# Patient Record
Sex: Female | Born: 1960
Health system: Southern US, Community
[De-identification: ages and names within clinical notes are randomized; demographics above are authoritative.]

## PROBLEM LIST (undated history)

## (undated) DIAGNOSIS — E039 Hypothyroidism, unspecified: Secondary | ICD-10-CM

## (undated) DIAGNOSIS — R109 Unspecified abdominal pain: Secondary | ICD-10-CM

## (undated) DIAGNOSIS — I10 Essential (primary) hypertension: Secondary | ICD-10-CM

## (undated) DIAGNOSIS — T50901A Poisoning by unspecified drugs, medicaments and biological substances, accidental (unintentional), initial encounter: Secondary | ICD-10-CM

## (undated) DIAGNOSIS — R251 Tremor, unspecified: Secondary | ICD-10-CM

## (undated) DIAGNOSIS — F32A Depression, unspecified: Secondary | ICD-10-CM

## (undated) DIAGNOSIS — E785 Hyperlipidemia, unspecified: Secondary | ICD-10-CM

## (undated) DIAGNOSIS — G43909 Migraine, unspecified, not intractable, without status migrainosus: Secondary | ICD-10-CM

## (undated) DIAGNOSIS — F419 Anxiety disorder, unspecified: Secondary | ICD-10-CM

## (undated) HISTORY — DX: Hyperlipidemia, unspecified: E78.5

## (undated) HISTORY — DX: Essential (primary) hypertension: I10

## (undated) HISTORY — PX: BILATERAL OOPHORECTOMY: SHX1221

## (undated) HISTORY — PX: VENTRAL HERNIA REPAIR: SHX424

## (undated) HISTORY — PX: HERNIA REPAIR: SHX51

## (undated) HISTORY — DX: Poisoning by unspecified drugs, medicaments and biological substances, accidental (unintentional), initial encounter: T50.901A

## (undated) HISTORY — DX: Anxiety disorder, unspecified: F41.9

## (undated) HISTORY — DX: Hypothyroidism, unspecified: E03.9

## (undated) HISTORY — PX: APPENDECTOMY: SHX54

## (undated) HISTORY — DX: Migraine, unspecified, not intractable, without status migrainosus: G43.909

## (undated) HISTORY — DX: Unspecified abdominal pain: R10.9

---

## 1998-08-10 ENCOUNTER — Emergency Department (HOSPITAL_COMMUNITY): Admission: EM | Admit: 1998-08-10 | Discharge: 1998-08-10 | Payer: Self-pay | Admitting: Emergency Medicine

## 1998-11-14 ENCOUNTER — Other Ambulatory Visit: Admission: RE | Admit: 1998-11-14 | Discharge: 1998-11-14 | Payer: Self-pay | Admitting: Obstetrics and Gynecology

## 1998-12-20 ENCOUNTER — Ambulatory Visit (HOSPITAL_BASED_OUTPATIENT_CLINIC_OR_DEPARTMENT_OTHER): Admission: RE | Admit: 1998-12-20 | Discharge: 1998-12-20 | Payer: Self-pay | Admitting: Orthopedic Surgery

## 2000-02-23 ENCOUNTER — Encounter: Admission: RE | Admit: 2000-02-23 | Discharge: 2000-02-23 | Payer: Self-pay | Admitting: Family Medicine

## 2000-02-23 ENCOUNTER — Encounter: Payer: Self-pay | Admitting: Family Medicine

## 2000-06-12 ENCOUNTER — Other Ambulatory Visit: Admission: RE | Admit: 2000-06-12 | Discharge: 2000-06-12 | Payer: Self-pay | Admitting: *Deleted

## 2000-11-27 ENCOUNTER — Encounter: Payer: Self-pay | Admitting: Surgery

## 2000-12-04 ENCOUNTER — Inpatient Hospital Stay (HOSPITAL_COMMUNITY): Admission: RE | Admit: 2000-12-04 | Discharge: 2000-12-05 | Payer: Self-pay | Admitting: Surgery

## 2001-08-06 ENCOUNTER — Other Ambulatory Visit: Admission: RE | Admit: 2001-08-06 | Discharge: 2001-08-06 | Payer: Self-pay | Admitting: *Deleted

## 2002-10-13 ENCOUNTER — Encounter: Payer: Self-pay | Admitting: General Surgery

## 2002-10-13 ENCOUNTER — Ambulatory Visit (HOSPITAL_COMMUNITY): Admission: RE | Admit: 2002-10-13 | Discharge: 2002-10-13 | Payer: Self-pay | Admitting: General Surgery

## 2002-10-19 ENCOUNTER — Emergency Department (HOSPITAL_COMMUNITY): Admission: EM | Admit: 2002-10-19 | Discharge: 2002-10-19 | Payer: Self-pay | Admitting: Emergency Medicine

## 2002-10-19 ENCOUNTER — Encounter: Payer: Self-pay | Admitting: Emergency Medicine

## 2002-10-26 ENCOUNTER — Other Ambulatory Visit: Admission: RE | Admit: 2002-10-26 | Discharge: 2002-10-26 | Payer: Self-pay | Admitting: *Deleted

## 2003-12-03 ENCOUNTER — Ambulatory Visit (HOSPITAL_COMMUNITY): Admission: RE | Admit: 2003-12-03 | Discharge: 2003-12-03 | Payer: Self-pay | Admitting: *Deleted

## 2004-02-03 ENCOUNTER — Other Ambulatory Visit: Admission: RE | Admit: 2004-02-03 | Discharge: 2004-02-03 | Payer: Self-pay | Admitting: *Deleted

## 2004-02-18 ENCOUNTER — Emergency Department (HOSPITAL_COMMUNITY): Admission: EM | Admit: 2004-02-18 | Discharge: 2004-02-18 | Payer: Self-pay | Admitting: Emergency Medicine

## 2004-04-13 ENCOUNTER — Ambulatory Visit: Payer: Self-pay | Admitting: Internal Medicine

## 2004-05-15 ENCOUNTER — Encounter: Admission: RE | Admit: 2004-05-15 | Discharge: 2004-08-13 | Payer: Self-pay | Admitting: Internal Medicine

## 2005-03-14 ENCOUNTER — Ambulatory Visit (HOSPITAL_COMMUNITY): Admission: RE | Admit: 2005-03-14 | Discharge: 2005-03-14 | Payer: Self-pay | Admitting: *Deleted

## 2005-03-16 ENCOUNTER — Emergency Department (HOSPITAL_COMMUNITY): Admission: EM | Admit: 2005-03-16 | Discharge: 2005-03-17 | Payer: Self-pay | Admitting: Emergency Medicine

## 2005-12-19 ENCOUNTER — Inpatient Hospital Stay (HOSPITAL_COMMUNITY): Admission: AD | Admit: 2005-12-19 | Discharge: 2005-12-20 | Payer: Self-pay | Admitting: Obstetrics & Gynecology

## 2005-12-20 ENCOUNTER — Observation Stay (HOSPITAL_COMMUNITY): Admission: AD | Admit: 2005-12-20 | Discharge: 2005-12-21 | Payer: Self-pay | Admitting: Obstetrics & Gynecology

## 2006-01-03 ENCOUNTER — Encounter (INDEPENDENT_AMBULATORY_CARE_PROVIDER_SITE_OTHER): Payer: Self-pay | Admitting: Specialist

## 2006-01-03 ENCOUNTER — Inpatient Hospital Stay (HOSPITAL_COMMUNITY): Admission: RE | Admit: 2006-01-03 | Discharge: 2006-01-08 | Payer: Self-pay | Admitting: Obstetrics & Gynecology

## 2006-05-15 ENCOUNTER — Emergency Department (HOSPITAL_COMMUNITY): Admission: EM | Admit: 2006-05-15 | Discharge: 2006-05-15 | Payer: Self-pay | Admitting: Emergency Medicine

## 2006-08-26 ENCOUNTER — Emergency Department (HOSPITAL_COMMUNITY): Admission: EM | Admit: 2006-08-26 | Discharge: 2006-08-26 | Payer: Self-pay

## 2007-02-10 HISTORY — PX: ABDOMINAL HYSTERECTOMY: SHX81

## 2007-02-12 ENCOUNTER — Encounter (INDEPENDENT_AMBULATORY_CARE_PROVIDER_SITE_OTHER): Payer: Self-pay | Admitting: Obstetrics and Gynecology

## 2007-02-12 ENCOUNTER — Ambulatory Visit (HOSPITAL_COMMUNITY): Admission: RE | Admit: 2007-02-12 | Discharge: 2007-02-12 | Payer: Self-pay | Admitting: Obstetrics and Gynecology

## 2007-06-12 DIAGNOSIS — T50901A Poisoning by unspecified drugs, medicaments and biological substances, accidental (unintentional), initial encounter: Secondary | ICD-10-CM

## 2007-06-12 HISTORY — DX: Poisoning by unspecified drugs, medicaments and biological substances, accidental (unintentional), initial encounter: T50.901A

## 2007-10-16 ENCOUNTER — Emergency Department (HOSPITAL_COMMUNITY): Admission: EM | Admit: 2007-10-16 | Discharge: 2007-10-16 | Payer: Self-pay | Admitting: Emergency Medicine

## 2007-10-16 ENCOUNTER — Inpatient Hospital Stay (HOSPITAL_COMMUNITY): Admission: AD | Admit: 2007-10-16 | Discharge: 2007-10-20 | Payer: Self-pay | Admitting: *Deleted

## 2007-10-16 ENCOUNTER — Ambulatory Visit: Payer: Self-pay | Admitting: *Deleted

## 2007-10-21 ENCOUNTER — Other Ambulatory Visit (HOSPITAL_COMMUNITY): Admission: RE | Admit: 2007-10-21 | Discharge: 2007-11-11 | Payer: Self-pay | Admitting: Psychiatry

## 2007-10-22 ENCOUNTER — Ambulatory Visit: Payer: Self-pay | Admitting: Psychiatry

## 2007-11-12 ENCOUNTER — Ambulatory Visit (HOSPITAL_COMMUNITY): Payer: Self-pay | Admitting: Psychiatry

## 2007-12-01 ENCOUNTER — Emergency Department (HOSPITAL_COMMUNITY): Admission: EM | Admit: 2007-12-01 | Discharge: 2007-12-01 | Payer: Self-pay | Admitting: Emergency Medicine

## 2007-12-16 ENCOUNTER — Ambulatory Visit (HOSPITAL_COMMUNITY): Payer: Self-pay | Admitting: Psychiatry

## 2009-01-10 ENCOUNTER — Encounter: Admission: RE | Admit: 2009-01-10 | Discharge: 2009-01-10 | Payer: Self-pay | Admitting: Family Medicine

## 2009-03-21 ENCOUNTER — Emergency Department (HOSPITAL_COMMUNITY): Admission: EM | Admit: 2009-03-21 | Discharge: 2009-03-21 | Payer: Self-pay | Admitting: Emergency Medicine

## 2010-02-09 HISTORY — PX: COLONOSCOPY: SHX174

## 2010-02-20 ENCOUNTER — Encounter: Admission: RE | Admit: 2010-02-20 | Discharge: 2010-02-20 | Payer: Self-pay | Admitting: Family Medicine

## 2010-02-22 ENCOUNTER — Encounter (INDEPENDENT_AMBULATORY_CARE_PROVIDER_SITE_OTHER): Payer: Self-pay | Admitting: *Deleted

## 2010-02-22 ENCOUNTER — Telehealth: Payer: Self-pay | Admitting: Internal Medicine

## 2010-02-23 ENCOUNTER — Encounter: Payer: Self-pay | Admitting: Gastroenterology

## 2010-02-23 ENCOUNTER — Ambulatory Visit: Payer: Self-pay | Admitting: Gastroenterology

## 2010-02-23 ENCOUNTER — Encounter (INDEPENDENT_AMBULATORY_CARE_PROVIDER_SITE_OTHER): Payer: Self-pay | Admitting: *Deleted

## 2010-02-23 DIAGNOSIS — R197 Diarrhea, unspecified: Secondary | ICD-10-CM

## 2010-02-23 LAB — CONVERTED CEMR LAB
ALT: 35 units/L
Albumin: 4.6 g/dL
Alkaline Phosphatase: 51 units/L
Basophils Absolute: 0 10*3/uL
Basophils Relative: 0 %
CO2: 23 meq/L
Chloride: 106 meq/L
Creatinine, Ser: 0.91 mg/dL
Eosinophils Relative: 1 %
HCT: 38 %
Hemoglobin: 12 g/dL
Lymphs Abs: 3.1 10*3/uL
MCV: 86.4 fL
Monocytes Relative: 5 %
Platelets: 284 10*3/uL
Potassium: 4.4 meq/L
RBC: 4.4 M/uL
Sodium: 140 meq/L
Total Protein: 7.2 g/dL

## 2010-02-24 ENCOUNTER — Encounter: Payer: Self-pay | Admitting: Gastroenterology

## 2010-02-25 ENCOUNTER — Encounter: Payer: Self-pay | Admitting: Gastroenterology

## 2010-03-01 ENCOUNTER — Telehealth (INDEPENDENT_AMBULATORY_CARE_PROVIDER_SITE_OTHER): Payer: Self-pay

## 2010-03-01 LAB — CONVERTED CEMR LAB
ALT: 35 units/L (ref 0–35)
AST: 22 units/L (ref 0–37)
Albumin: 4.6 g/dL (ref 3.5–5.2)
Alkaline Phosphatase: 51 units/L (ref 39–117)
Basophils Absolute: 0 10*3/uL (ref 0.0–0.1)
Glucose, Bld: 150 mg/dL — ABNORMAL HIGH (ref 70–99)
Lipase: 42 units/L (ref 0–75)
Lymphs Abs: 3.1 10*3/uL (ref 0.7–4.0)
Monocytes Absolute: 0.5 10*3/uL (ref 0.1–1.0)
Neutro Abs: 5.2 10*3/uL (ref 1.7–7.7)
Neutrophils Relative %: 59 % (ref 43–77)
Potassium: 4.4 meq/L (ref 3.5–5.3)
RBC: 4.4 M/uL (ref 3.87–5.11)
RDW: 14.5 % (ref 11.5–15.5)
Sed Rate: 14 mm/hr (ref 0–22)
Total Bilirubin: 0.3 mg/dL (ref 0.3–1.2)

## 2010-03-09 ENCOUNTER — Ambulatory Visit (HOSPITAL_COMMUNITY): Admission: RE | Admit: 2010-03-09 | Discharge: 2010-03-09 | Payer: Self-pay | Admitting: Internal Medicine

## 2010-03-09 ENCOUNTER — Ambulatory Visit: Payer: Self-pay | Admitting: Internal Medicine

## 2010-03-16 ENCOUNTER — Encounter: Payer: Self-pay | Admitting: Internal Medicine

## 2010-03-28 ENCOUNTER — Telehealth (INDEPENDENT_AMBULATORY_CARE_PROVIDER_SITE_OTHER): Payer: Self-pay

## 2010-03-28 LAB — CONVERTED CEMR LAB
ALT: 33 units/L
AST: 19 units/L
Albumin: 4.5 g/dL
Alkaline Phosphatase: 58 units/L
Cholesterol: 367 mg/dL
Creatinine, Ser: 0.7 mg/dL
Glucose, Bld: 179 mg/dL
LDL Cholesterol: 262 mg/dL
Sodium: 140 meq/L
TSH: 2.3 microintl units/mL
Triglyceride fasting, serum: 263 mg/dL

## 2010-03-29 ENCOUNTER — Encounter: Payer: Self-pay | Admitting: Internal Medicine

## 2010-03-29 ENCOUNTER — Telehealth (INDEPENDENT_AMBULATORY_CARE_PROVIDER_SITE_OTHER): Payer: Self-pay | Admitting: *Deleted

## 2010-03-29 ENCOUNTER — Ambulatory Visit: Payer: Self-pay | Admitting: Gastroenterology

## 2010-03-31 ENCOUNTER — Encounter (INDEPENDENT_AMBULATORY_CARE_PROVIDER_SITE_OTHER): Payer: Self-pay | Admitting: *Deleted

## 2010-03-31 LAB — CONVERTED CEMR LAB
BUN: 14 mg/dL
Bilirubin, Direct: 0.08 mg/dL
Calcium: 9.7 mg/dL
Cholesterol: 367 mg/dL
GFR calc non Af Amer: 90 mL/min
Glucose, Bld: 179 mg/dL
HDL: 36 mg/dL
T3, Free: 31 pg/mL
TSH: 2.28 microintl units/mL
Total Protein: 7.1 g/dL

## 2010-04-04 ENCOUNTER — Encounter: Payer: Self-pay | Admitting: Internal Medicine

## 2010-04-04 ENCOUNTER — Telehealth (INDEPENDENT_AMBULATORY_CARE_PROVIDER_SITE_OTHER): Payer: Self-pay | Admitting: *Deleted

## 2010-04-05 ENCOUNTER — Encounter: Payer: Self-pay | Admitting: Internal Medicine

## 2010-04-05 ENCOUNTER — Encounter (HOSPITAL_COMMUNITY)
Admission: RE | Admit: 2010-04-05 | Discharge: 2010-05-05 | Payer: Self-pay | Source: Home / Self Care | Admitting: Internal Medicine

## 2010-04-07 ENCOUNTER — Encounter (INDEPENDENT_AMBULATORY_CARE_PROVIDER_SITE_OTHER): Payer: Self-pay | Admitting: *Deleted

## 2010-04-13 ENCOUNTER — Encounter: Payer: Self-pay | Admitting: Cardiology

## 2010-04-13 ENCOUNTER — Ambulatory Visit: Payer: Self-pay | Admitting: Cardiology

## 2010-04-13 DIAGNOSIS — F411 Generalized anxiety disorder: Secondary | ICD-10-CM | POA: Insufficient documentation

## 2010-04-13 DIAGNOSIS — E119 Type 2 diabetes mellitus without complications: Secondary | ICD-10-CM | POA: Insufficient documentation

## 2010-04-13 DIAGNOSIS — G43909 Migraine, unspecified, not intractable, without status migrainosus: Secondary | ICD-10-CM | POA: Insufficient documentation

## 2010-04-13 DIAGNOSIS — D126 Benign neoplasm of colon, unspecified: Secondary | ICD-10-CM

## 2010-04-13 DIAGNOSIS — F329 Major depressive disorder, single episode, unspecified: Secondary | ICD-10-CM

## 2010-04-13 DIAGNOSIS — R079 Chest pain, unspecified: Secondary | ICD-10-CM

## 2010-04-13 DIAGNOSIS — J45909 Unspecified asthma, uncomplicated: Secondary | ICD-10-CM | POA: Insufficient documentation

## 2010-04-25 ENCOUNTER — Ambulatory Visit (HOSPITAL_COMMUNITY): Admission: RE | Admit: 2010-04-25 | Discharge: 2010-04-25 | Payer: Self-pay | Admitting: Cardiology

## 2010-04-25 ENCOUNTER — Ambulatory Visit: Payer: Self-pay | Admitting: Cardiology

## 2010-04-25 ENCOUNTER — Encounter: Payer: Self-pay | Admitting: Internal Medicine

## 2010-05-02 ENCOUNTER — Ambulatory Visit: Payer: Self-pay | Admitting: Cardiology

## 2010-05-03 ENCOUNTER — Encounter: Payer: Self-pay | Admitting: Internal Medicine

## 2010-05-11 ENCOUNTER — Encounter (HOSPITAL_COMMUNITY)
Admission: RE | Admit: 2010-05-11 | Discharge: 2010-06-10 | Payer: Self-pay | Source: Home / Self Care | Attending: Internal Medicine | Admitting: Internal Medicine

## 2010-05-29 ENCOUNTER — Telehealth: Payer: Self-pay | Admitting: Cardiology

## 2010-05-31 ENCOUNTER — Encounter: Payer: Self-pay | Admitting: Internal Medicine

## 2010-05-31 ENCOUNTER — Telehealth (INDEPENDENT_AMBULATORY_CARE_PROVIDER_SITE_OTHER): Payer: Self-pay | Admitting: *Deleted

## 2010-06-07 ENCOUNTER — Telehealth (INDEPENDENT_AMBULATORY_CARE_PROVIDER_SITE_OTHER): Payer: Self-pay | Admitting: *Deleted

## 2010-06-13 ENCOUNTER — Encounter (HOSPITAL_COMMUNITY)
Admission: RE | Admit: 2010-06-13 | Discharge: 2010-07-11 | Payer: Self-pay | Source: Home / Self Care | Attending: Internal Medicine | Admitting: Internal Medicine

## 2010-07-11 NOTE — Letter (Signed)
Summary: REFERRAL FROM [facility]  REFERRAL FROM DONALD MOORE   Imported By: Rexene Alberts 02/24/2010 09:37:17  _____________________________________________________________________  External Attachment:    Type:   Image     Comment:   External Document

## 2010-07-11 NOTE — Progress Notes (Signed)
Summary: phone note/ ? about diabetic meds prior to TCS  Phone Note Call from Patient   Caller: Patient Summary of Call: Pt wants to know does her diabetic medication need to be adjusted for her prep. Please advise! Initial call taken by: Cloria Spring LPN,  March 01, 2010 4:27 PM     Appended Document: phone note/ ? about diabetic meds prior to TCS Please see addendum to OV note, instructions were given for her DM meds.  Appended Document: phone note/ ? about diabetic meds prior to TCS Pt informed per Leslie's note, to decrease Metformin to 250 mg two times a day on day of prep.

## 2010-07-11 NOTE — Letter (Signed)
Summary: Physical Therapy Consultation  [facility]   Imported By: Rexene Alberts 05/03/2010 16:03:15  _____________________________________________________________________  External Attachment:    Type:   Image     Comment:   External Document

## 2010-07-11 NOTE — Letter (Signed)
Summary: CLINIC NOTE FROM DR [facility] NOTE FROM DR Dietrich Pates   Imported By: Rexene Alberts 04/25/2010 10:02:03  _____________________________________________________________________  External Attachment:    Type:   Image     Comment:   External Document

## 2010-07-11 NOTE — Miscellaneous (Signed)
Summary: labs cbcd,sd rate,cmp,02/23/2010  Clinical Lists Changes  Observations: Added new observation of CALCIUM: 9.6 mg/dL (41/32/4401 02:72) Added new observation of ALBUMIN: 4.6 g/dL (53/66/4403 47:42) Added new observation of PROTEIN, TOT: 7.2 g/dL (59/56/3875 64:33) Added new observation of SGPT (ALT): 35 units/L (02/23/2010 10:01) Added new observation of SGOT (AST): 22 units/L (02/23/2010 10:01) Added new observation of ALK PHOS: 51 units/L (02/23/2010 10:01) Added new observation of CREATININE: 0.91 mg/dL (29/51/8841 66:06) Added new observation of BUN: 19 mg/dL (30/16/0109 32:35) Added new observation of BG RANDOM: 150 mg/dL (57/32/2025 42:70) Added new observation of CO2 PLSM/SER: 23 meq/L (02/23/2010 10:01) Added new observation of CL SERUM: 106 meq/L (02/23/2010 10:01) Added new observation of K SERUM: 4.4 meq/L (02/23/2010 10:01) Added new observation of NA: 140 meq/L (02/23/2010 10:01) Added new observation of ABSOLUTE BAS: 0.0 K/uL (02/23/2010 10:01) Added new observation of BASOPHIL %: 0 % (02/23/2010 10:01) Added new observation of EOS ABSLT: 0.1 K/uL (02/23/2010 10:01) Added new observation of % EOS AUTO: 1 % (02/23/2010 10:01) Added new observation of ABSOLUTE MON: 0.5 K/uL (02/23/2010 10:01) Added new observation of MONOCYTE %: 5 % (02/23/2010 10:01) Added new observation of ABS LYMPHOCY: 3.1 K/uL (02/23/2010 10:01) Added new observation of LYMPHS %: 35 % (02/23/2010 10:01) Added new observation of PLATELETK/UL: 284 K/uL (02/23/2010 10:01) Added new observation of RDW: 14.5 % (02/23/2010 10:01) Added new observation of MCHC RBC: 31.6 g/dL (62/37/6283 15:17) Added new observation of MCV: 86.4 fL (02/23/2010 10:01) Added new observation of HCT: 38.0 % (02/23/2010 10:01) Added new observation of HGB: 12.0 g/dL (61/60/7371 06:26) Added new observation of RBC M/UL: 4.40 M/uL (02/23/2010 10:01) Added new observation of WBC COUNT: 8.8 10*3/microliter (02/23/2010 10:01)

## 2010-07-11 NOTE — Letter (Signed)
Summary: FMLA [employer] paperwork  FMLA [employer] paperwork   Imported By: Minna Merritts 04/05/2010 14:11:42  _____________________________________________________________________  External Attachment:    Type:   Image     Comment:   External Document

## 2010-07-11 NOTE — Progress Notes (Signed)
Summary: phone note/ intermittent abd pain  Phone Note Call from Patient   Caller: Patient Summary of Call: Pt called and said her pain is all the way across her abdomen now. It is intermittent. She wants to know what can be done. She has appt to see Verlon Au on 04/10/2010, and said she would like to be seen earlier. She is also concerned that her work says they never recieved papers from here that were dealing with FMLA and now she is having to deal with not being paid in addition to the pain. I just told her i would let it be known what she said...and if she worsend over night to go to the ED.  Initial call taken by: Cloria Spring LPN,  March 28, 2010 4:42 PM     Appended Document: phone note/ intermittent abd pain Have Tobi Bastos see pt today. She will need to wait for Dr. Jena Gauss to get back from vacation to address the FMLA issue. Pt has functional gut disorder. Already on a SSRI. Add Align daily and increase Bentyl to 20 mg qac and hs. She should avoid lactose.  Appended Document: phone note/ intermittent abd pain Called and LMOM for pt to be here at 11:30.  Appended Document: phone note/ intermittent abd pain Pt seen today. See OV note.

## 2010-07-11 NOTE — Letter (Signed)
Summary: PT Referral  PT Referral   Imported By: Ave Filter 03/29/2010 16:01:05  _____________________________________________________________________  External Attachment:    Type:   Image     Comment:   External Document

## 2010-07-11 NOTE — Assessment & Plan Note (Signed)
Summary: SEVERE ABD PAIN/LAW   Visit Type:  Initial Consult Referring Provider:  Paulene Floor, NP Primary Care Provider:  Rudi Heap  Chief Complaint:  severe abd pain.  History of Present Illness: Ms. Vanalstine is a pleasant 50 y/o WF, patient of Dr. Rudi Heap, who presents for further evaluation of severe abd pain.   Pain started one week ago. First started in chest. EMS came to house, EKG okay, so patient refused to go to hospital. Dexilant started by PCP for ?GERD. No improvement after five days. Symptoms just got worse. Pain started more in belly on Sunday. EMS called again, but patient again would not go to hospital. Saw PCP on Monday.  CT A/P okay. Pain continued to get worse. Pain in LLQ and into back. Pain awful and cannot sleep. Diarrhea worse since pain started. BM 5-6 loose stools. No melena. BRBPR. No heartburn. Pain in chest, heavy and down left arm. Some SOB. Clammy with it. Some nausea but no vomiting. Not really related to meals. Fried food causes more diarrhea. No fever or chills. No ill contacts or recent abx use.  CT A/P 02/20/10-->heptatic steatosis, small calcification in small bowel mesentery unchanged from 5/09, ventral hernia repair.  Current Medications (verified): 1)  Metformin Hcl 500 Mg Tabs (Metformin Hcl) .... Take 1 Tablet By Mouth Two Times A Day 2)  Fenofibrate 160 Mg Tabs (Fenofibrate) .... Take 1 Tablet By Mouth Once A Day 3)  Benazepril Hcl 20 Mg Tabs (Benazepril Hcl) .... Take 1 Tablet By Mouth Once A Day 4)  Levothroid 75 Mcg Tabs (Levothyroxine Sodium) .... Take 1 Tablet By Mouth Once A Day 5)  Dexilant 60 Mg Cpdr (Dexlansoprazole) .... Take 1 Tablet By Mouth Once A Day 6)  Sertraline Hcl 100 Mg Tabs (Sertraline Hcl) .... Take 1 Tablet By Mouth Once A Day 7)  Propranolol Hcl 40 Mg Tabs (Propranolol Hcl) .... Take 1 Tablet By Mouth Two Times A Day 8)  Vitamin D 35573 Units .... One Tablet Weekly 9)  Trilipix 135 Mg Cpdr (Choline Fenofibrate) .... One  Capsule Daily 10)  Lipitor 40 Mg Tabs (Atorvastatin Calcium) .... Take 1 Tablet By Mouth Once A Day 11)  Topiramate 50 Mg Tabs (Topiramate) .... Take 1 Tablet By Mouth Two Times A Day  Allergies (verified): 1)  ! Benadryl 2)  ! Jonne Ply  Past History:  Past Medical History: Drug overdose, 2009, 60 Naprosyn, psychiatric admission Migraines Anxiety Disorder Asthma Depression Diabetes Hyperlipidemia Hypertension Hypothyroidism  Past Surgical History: Hysterectomy with lysis of adhesions, 9/08 Both tubes/ovarians removed in multiple surgeries prior to 9/08 Ventral hernia repair X 3 appendectomy, ruptured, late 90s  Family History: Father, heart disease, deceased Mother, Afib, epilepsy Most maternal family, gb surgeries. No FH colon cancer, liver disease.  Social History: Married. No children. [employer], claims processor. Never smoked. No alcohol. No drugs.   Review of Systems General:  Denies fever, chills, anorexia, fatigue, weakness, and weight loss. Eyes:  Denies vision loss. ENT:  Denies nasal congestion, sore throat, hoarseness, and difficulty swallowing. CV:  Complains of chest pains; denies angina, palpitations, dyspnea on exertion, and peripheral edema; recent chest pain. Resp:  Denies dyspnea at rest, dyspnea with exercise, cough, sputum, and wheezing. GI:  See HPI. GU:  Denies urinary burning and blood in urine. MS:  Complains of low back pain. Derm:  Denies rash and itching. Neuro:  Denies weakness, frequent headaches, memory loss, and confusion. Psych:  Denies depression and anxiety. Endo:  Denies unusual weight  change. Heme:  Denies bruising and bleeding. Allergy:  Denies hives and rash.  Vital Signs:  Patient profile:   50 year old female Height:      63.5 inches Weight:      249 pounds BMI:     43.57 Temp:     97.8 degrees F oral Pulse rate:   72 / minute BP sitting:   122 / 92  (left arm) Cuff size:   large  Vitals Entered By: Cloria Spring  LPN (February 23, 2010 11:28 AM)  Physical Exam  General:  Well developed, well nourished, no acute distress.obese.  Appears comfortable.  Head:  Normocephalic and atraumatic. Eyes:  Conjunctivae pink, no scleral icterus.  Mouth:  Oropharyngeal mucosa moist, pink.  No lesions, erythema or exudate.    Neck:  Supple; no masses or thyromegaly. Lungs:  Clear throughout to auscultation. Heart:  Regular rate and rhythm; no murmurs, rubs,  or bruits. Abdomen:  Soft. Positive BS. Obese. Mild to moderate LLQ tenderness to deep palpation. Pain with palpation over lower lumbar spine. No rebound or guarding. No HSM or masses. No abd bruit or hernia. No CVA tenderness. Extremities:  No clubbing, cyanosis, edema or deformities noted. Neurologic:  Alert and  oriented x4;  grossly normal neurologically. Skin:  Intact without significant lesions or rashes. Cervical Nodes:  No significant cervical adenopathy. Psych:  Alert and cooperative. Normal mood and affect.  Impression & Recommendations:  Problem # 1:  ABDOMINAL PAIN, LEFT LOWER QUADRANT (ICD-789.04) One week h/o pain which originated in the chest and has localized into LLQ and into lower back. She has associated change in bowel function. 5-6 loose stools daily. No fever or chills. No urinary symptoms. Some possible pp component but patient "not sure". She denies h/o chronic intermittent abd pain but looking through medical records, she has had multiple CTs throughout past several years. Given increased diarrhea, need to r/o acute infectious process. DDx includes IBS. Doubt biliary etiology.  Find it interesting that she has pain with palpation of lumbar spine. If she were having referred pain to abd, would not expect reproduction of abd pain on physical exam. Discussed with her the abscence of diagnosis, based on normal CT. Will check labs/stools/add antispasmotic. If she develops fever, bloody stool, uncontrollable pain, she should seek emergent medical  attention at closest ED. Otherwise, will wait for labs/stools. She may ultimately need endoscopic evaluation.  Orders: T-Lipase [id number]) T-Comprehensive Metabolic Panel [id number] [id number]) T-CBC w/Diff [id number]) T-Sed Rate (Automated) [id number])  Other Orders: T-igA ([id number]) T-Tissue Transglutamase Ab IgA ([id number]) T-Culture, Stool ([id number]) T-Culture, C-Diff Toxin A/B ([id number]) T-Stool Giardia / Crypto- EIA ([id number]) T-Fecal WBC ([id number]) Prescriptions: DICYCLOMINE HCL 10 MG CAPS (DICYCLOMINE HCL) one by mouth qac and at bedtime for diarrhea, abd pain. Hold for constipation  #120 x 1   Entered and Authorized by:   Leanna Battles. Dixon Boos   Signed by:   Leanna Battles Lewis PA-C on 02/23/2010   Method used:   Print then Give to Patient   RxID:   [id number]  I would like to thank Dr. Rudi Heap for allowing [place] to take part in the care of this nice patient.   Appended Document: SEVERE ABD PAIN/LAW Pt likely has functional abd pain. Would perform EGD/TCS WITH PROPOFOL ON high dose SSRI and Topamax and BMI >40. Evaluate for  H. pylori gastritis, celiac sprue, or microscopic colitis.  Appended Document: SEVERE ABD PAIN/LAW Please schedule for TCS/EGD in OR with [facility]. In OR due to polypharmacy.  Evaluate for H.Pylori, celiac, microscopic colitis. Please arrange ASAP.  Day of prep, Metformin 250mg  two times a day.  Appended Document: SEVERE ABD PAIN/LAW Pt aware this will be scheduled.  Appended Document: SEVERE ABD PAIN/LAW Pt scheduled for 03/09/10@10 :00a.m.  Appended Document: Orders Update    Clinical Lists Changes  Orders: Added new Service order of Consultation Level IV [id number]) - Signed

## 2010-07-11 NOTE — Progress Notes (Signed)
Summary: triage  Phone Note From Other Clinic Call back at [phone]   Caller: Debbie from Dr Varney Baas office Call For: DOD Summary of Call: Dr Christell Constant would like this patient seen before first available appt 10-24 for left side abd pain negative ct and nausea. Initial call taken by: Tawni Levy,  February 22, 2010 10:50 AM  Follow-up for Phone Call        Dr.Moore's office aware of appt. for next Friday with Dr.Patterson. Follow-up by: Teryl Lucy RN,  February 22, 2010 1:01 PM

## 2010-07-11 NOTE — Progress Notes (Signed)
Summary: FMLA follow up/MM  Phone Note Call from Patient Call back at Home Phone [phone]   Caller: Patient Call For: Minna Merritts Reason for Call: Insurance Question Details for Reason: following up on FMLA Summary of Call: Spoke with Ms. Duncanson RE: her out of work excuse and FMLA paperwork.  Explained that we would be happy to fill out paperwork for care up through the 19th of Oct. when she followed up in the office.  She asked why the physical therapy, psychiatry appts and cardiology referral would not be covered under FMLA paperwork since we stated she needed these services.  I did review the office note and these were acknowledged as care plan but they were not stipulations for returning to work.  She said she can not work due to the pain and her husband can not pay all the bills himself and the employer will not pay her without FMLA paperwork.  She also stated that there was a delay in the cardiology referral due to PCP unavailablity.  I went ahead and made the patient an appt with Dr. Dietrich Pates for Nov 1st @ 1:15p (will communicate to pt when I return her call).  Explained that we could complete FMLA paperwork for DOS through Mar 29, 2010 if RMR approves and that I would ask about the extension for PT.    Further, if approval for PT is recommended and she completes; at that point, if she still can not return to work that further FMLA would need to be supported by non-GI providers; as our workup and recommendations would be met.  Explained that I would contact RMR for his recommendation and will get back to her. Initial call taken by: Minna Merritts,  April 04, 2010 12:21 PM     Appended Document: FMLA follow up/MM i approve flma from original ov here through 03/29/10 only  Appended Document: FMLA follow up/MM [facility] that paperwork is ready to be picked up and what the documentation on the paperwork would say and why.  Patient can call me back if necessary but no modifications to  plan of care or FMLA documentation per RMR at this time.

## 2010-07-11 NOTE — Letter (Signed)
Summary: New Patient letter  [facility]  [address] [place], [place] [address]   Phone: [phone]  Fax: [phone]       02/22/2010 MRN: [id number]  [facility] [address] [place], [place]  [address]  Dear [name],  Welcome to the Gastroenterology Division at [facility].    You are scheduled to see Dr. Sheryn Bison  on  Friday 03/03/2010  at  9:45 a.m. on the 3rd floor at [facility], [address].  We ask that you try to arrive at our office 15 minutes prior to your appointment time to allow for check-in.  We would like you to complete the enclosed self-administered evaluation form prior to your visit and bring it with you on the day of your appointment.  We will review it with you.  Also, please bring a complete list of all your medications or, if you prefer, bring the medication bottles and we will list them.  Please bring your insurance card so that we may make a copy of it.  If your insurance requires a referral to see a specialist, please bring your referral form from your primary care physician.  Co-payments are due at the time of your visit and may be paid by cash, check or credit card.     Your office visit will consist of a consult with your physician (includes a physical exam), any laboratory testing he/she may order, scheduling of any necessary diagnostic testing (e.g. x-ray, ultrasound, CT-scan), and scheduling of a procedure (e.g. Endoscopy, Colonoscopy) if required.  Please allow enough time on your schedule to allow for any/all of these possibilities.    If you cannot keep your appointment, please call [phone] to cancel or reschedule prior to your appointment date.  This allows [place] the opportunity to schedule an appointment for another patient in need of care.  If you do not cancel or reschedule by 5 p.m. the business day prior to your appointment date, you will be charged a $50.00 late cancellation/no-show fee.      Thank you for choosing [facility] for your medical needs.  We appreciate the opportunity to care for you.  Please visit [place] at our website  to learn more about our practice.                     Sincerely,                                                             The Gastroenterology Division

## 2010-07-11 NOTE — Assessment & Plan Note (Signed)
Summary: **per Dr.Rourk for chronic chest and upper gastric pain/tg   Visit Type:  Follow-up Referring Provider:  GI-Dr. Jena Castro Primary Provider:  Bennie Castro   History of Present Illness: It was my pleasure of evaluating [name] at the kind request of Dr. Jena Castro for chest discomfort.  This nice woman has multiple cardiovascular risk factors but no known vascular disease.  She has had been postmenopausal for a number of years as a result of bilateral oophorectomy.  Her principal problem has been chronic pelvic pain, that is virtually constant and has been present for years.  She attributes this to multiple gynecologic surgical procedures, the last of which was performed approximately 4 years ago.  She also has had episodes of left upper chest pressure of mild to moderate severity with radiation down the left arm.  This typically occurs at rest, frequently when she is experiencing stress.  She has had some episodes with exertion.  There is mild associated dyspnea but no nausea nor diaphoresis.  Medical records from [facility] and from Dr. Luvenia Starch office obtained and reviewed.     Current Medications (verified): 1)  Metformin Hcl 500 Mg Tabs (Metformin Hcl) .... Take 1 Tablet By Mouth Two Times A Day 2)  Fenofibrate 160 Mg Tabs (Fenofibrate) .... Take 1 Tablet By Mouth Once A Day 3)  Benazepril Hcl 20 Mg Tabs (Benazepril Hcl) .... Take 1 Tablet By Mouth Once A Day 4)  Levothroid 75 Mcg Tabs (Levothyroxine Sodium) .... Take 1 Tablet By Mouth Once A Day 5)  Propranolol Hcl 40 Mg Tabs (Propranolol Hcl) .... Take 1 Tablet By Mouth Two Times A Day 6)  Vitamin D [address] Units .... One Tablet Weekly 7)  Trilipix 135 Mg Cpdr (Choline Fenofibrate) .... One Capsule Daily 8)  Lipitor 40 Mg Tabs (Atorvastatin Calcium) .... Take 1 Tablet By Mouth Once A Day 9)  Topiramate 50 Mg Tabs (Topiramate) .... Take 1 Tablet By Mouth Two Times A Day 10)   Hydrocodone-Acetaminophen 5-500 Mg Tabs (Hydrocodone-Acetaminophen) .... One By Mouth Every 4-6 Hours As Needed Severe Pain Only 11)  Cymbalta 30 Mg Cpep (Duloxetine Hcl) .... Take 1 Tab Daily 12)  Nexium 40 Mg Cpdr (Esomeprazole Magnesium) .... Take 1 Tab Daily 13)  Clonazepam 0.5 Mg Tabs (Clonazepam) .... Take 1 Tab Three Times A Day 14)  Nitrostat 0.4 Mg Subl (Nitroglycerin) .Marland Kitchen.. 1 Tablet Under Tongue At Onset of Chest Pain; You May Repeat Every 5 Minutes For Up To 3 Doses.  Allergies (verified): 1)  ! Benadryl 2)  ! Asa  Comments:  Nurse/Medical Assistant: patient brought med bottles she is having her fenofibrate and lipitor  filled   Past History:  Family History: Last updated: 04/13/2010 Father: heart disease, deceased following cardiac surgery Mothe: h/o Afib, epilepsy Multiple family members with gallbladder disease. No FH colon cancer, liver disease.  Social History: Last updated: 04/13/2010 Married. 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  Tobacco-Never Alcohol-none Illicit drugs-none  Past Medical History: Chest pain Hyperlipidemia Hypertension Asthma Diabetes Drug overdose, 2009, 60 Naprosyn, psychiatric admission Migraines Anxiety Disorder/depression Hypothyroidism EGD/Colonoscopy 03/10/10 with RMR: normal upper endoscopy, single colonic polyp, biopsy normal  Past Surgical History: Hysterectomy with lysis of adhesions, 9/08 Bilateral oophorectomy in multiple surgeries prior to 9/08 Ventral hernia repair X 3 Appendectomy, ruptured, late 90s Colonoscopy-2010  Family History: Father: heart disease, deceased following cardiac surgery Mothe: h/o Afib, epilepsy Multiple family members with gallbladder disease. No FH colon cancer, liver disease.  Social  History: Married. 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  Tobacco-Never Alcohol-none Illicit drugs-none  Review of Systems       patient experiences  migraine headaches associated with nausea; requires corrective lenses; has been told of a heart murmur in the past and hypertension; she has gastroesophageal reflux disease symptoms.  Ankle edema is rarely present.  She has a history of asthma, but this is not classic by her description.  All of the systems reviewed and are negative.  Vital Signs:  Patient profile:   50 year old female Weight:      256 pounds BMI:     44.80 Pulse rate:   108 / minute BP sitting:   147 / 89  (right arm)  Vitals Entered By: Dreama Saa, CNA (April 13, 2010 1:29 PM)  Physical Exam  General:  Obese; well-developed; no acute distress: HEENT-[place]/AT; PERRL; EOM intact; conjunctiva and lids nl:  Neck-No JVD; no carotid bruits: Endocrine-No thyromegaly: Lungs-No tachypnea, clear without rales, rhonchi or wheezes: CV-normal PMI; normal S1 and S2:;  Abdomen-BS normal; soft and non-tender without masses or organomegaly: MS-No deformities, cyanosis or clubbing: Neurologic-Nl cranial nerves; symmetric strength and tone: Skin- Warm, no sig. lesions: Extremities-Nl distal pulses; no edema    Impression & Recommendations:  Problem # 1:  CHEST PAIN (ICD-786.50) Patient is at significant risk as the result of multiple cardiovascular risk factors, but her age is a major factor in her favor.  We will proceed with a stress echocardiogram to exclude any resting wall motion abnormalities and to evaluate for possible myocardial ischemia.  Sublingual nitroglycerin will be provided in the way of a therapeutic trial.  Problem # 2:  HYPERLIPIDEMIA (ICD-272.4) Lipid profile to be obtained and medication adjusted appropriately.  Problem # 3:  HYPERTENSION (ICD-401.1) BP is somewhat suboptimal today.  Additional determinations including values during her stress echocardiogram will be collected prior to adjusting antihypertensive medications.  BP today: 147/89 Prior BP: 130/80 (03/29/2010)  Labs Reviewed: K+: 4.4  (02/23/2010) Creat: : 0.91 (02/23/2010)     Other Orders: Stress Echo (Stress Echo)  Patient Instructions: 1)  Your physician recommends that you schedule a follow-up appointment in: 3 weeks 2)  New Medication: Nitroglycerin: Your physician recommended you take 1 tablet (or 1 spray) under tongue at onset of chest pain; you may repeat every 5 minutes for up to 3 doses. If 3 or more doses are required, call 911 and proceed to the ER immediately. 3)  Your physician has requested that you have a stress echocardiogram. For further information please visit [email or web address]  Please follow instruction sheet as given 4)  ***Please call office or go to ER for prolonged or severe chest pain*** Prescriptions: NITROSTAT 0.4 MG SUBL (NITROGLYCERIN) 1 tablet under tongue at onset of chest pain; you may repeat every 5 minutes for up to 3 doses.  #25 x 3   Entered by:   Larita Fife Via LPN   Authorized by:   Kathlen Brunswick, MD, [facility]   Signed by:   Larita Fife Via LPN on 16/96/7893   Method used:   Faxed to ...       [facility] (retail)       [address]       [place], [place]  [address]       Ph: [phone]       Fax: [phone]   RxID:   [id number]

## 2010-07-11 NOTE — Assessment & Plan Note (Signed)
Summary: 3 wk f/u per checkout on 04/13/10/tg   Visit Type:  Follow-up Referring Provider:  GI-Dr. Jena Gauss Primary Provider:  Bennie Castro   History of Present Illness: [name] returns to the office for continuing assessment of chest discomfort.  Her stress echocardiogram showed impaired exercise capacity with no stress-induced EKG or echocardiographic abnormalities.  She has had recurrent chest discomfort that has been fairly mild and promptly relieved with sublingual nitroglycerin.  She has utilized approximately 8 tablets during the past 2 weeks.  She has been limiting caloric intake and feeling relatively good.  Prior records and laboratory studies have been obtained from her primary care physician, reviewed and incorporated into the EMR.   -  Date:  03/28/2010    Cholesterol: 367    LDL: 262    HDL: 52    Triglycerides: 161    BG Random: 179    BUN: 14    Creatinine: 0.7    Sodium: 140    Potassium: 4.5    Chloride: 102    CO2 Total: 22    SGOT (AST): 19    SGPT (ALT): 33    T. Bilirubin: 0.2    Alk Phos: 58    Calcium: 9.7    Total Protein: 7.1    Albumin: 4.5    TSH: 2.3   Current Medications (verified): 1)  Metformin Hcl 500 Mg Tabs (Metformin Hcl) .... Take 1 Tablet By Mouth Two Times A Day 2)  Fenofibrate 160 Mg Tabs (Fenofibrate) .... Take 1 Tablet By Mouth Once A Day 3)  Benazepril Hcl 20 Mg Tabs (Benazepril Hcl) .... Take 1 Tablet By Mouth Once A Day 4)  Levothroid 75 Mcg Tabs (Levothyroxine Sodium) .... Take 1 Tablet By Mouth Once A Day 5)  Propranolol Hcl 40 Mg Tabs (Propranolol Hcl) .... Take 1 Tablet By Mouth Two Times A Day 6)  Vitamin D 09604 Units .... One Tablet Weekly 7)  Trilipix 135 Mg Cpdr (Choline Fenofibrate) .... One Capsule Daily 8)  Lipitor 40 Mg Tabs (Atorvastatin Calcium) .... Take 1 Tablet By Mouth Once A Day 9)  Topiramate 50 Mg Tabs (Topiramate) .... Take 1 Tablet By Mouth Two Times A Day 10)   Hydrocodone-Acetaminophen 5-500 Mg Tabs (Hydrocodone-Acetaminophen) .... One By Mouth Every 4-6 Hours As Needed Severe Pain Only 11)  Cymbalta 60 Mg Cpep (Duloxetine Hcl) .... Take 1 Tab Daily 12)  Clonazepam 0.5 Mg Tabs (Clonazepam) .... Take 1 Tab Three Times A Day 13)  Nitrostat 0.4 Mg Subl (Nitroglycerin) .Marland Kitchen.. 1 Tablet Under Tongue At Onset of Chest Pain; You May Repeat Every 5 Minutes For Up To 3 Doses.  Allergies (verified): 1)  ! Benadryl 2)  ! Asa  Comments:  Nurse/Medical Assistant: patient brought meds and stated the only change  is cymbalta 60mg  daily  nexium stopped by GI  Past History:  PMH, FH, and Social History reviewed and updated.  Past Medical History: Chest pain Hyperlipidemia Hypertension Chronic abdominal and pelvic pain resulting in significant loss of time from work Asthma Diabetes Drug overdose, 2009, 60 Naprosyn, psychiatric admission Migraines Anxiety Disorder/depression Hypothyroidism EGD/Colonoscopy 03/10/10 with RMR: normal upper endoscopy, single colonic polyp, biopsy normal  Review of Systems       See history of present illness.  Vital Signs:  Patient profile:   50 year old female Weight:      248 pounds O2 Sat:      97 % on Room air Pulse rate:   80 /  minute BP sitting:   129 / 77  (right arm)  Vitals Entered By: Mary Saa, CNA (May 02, 2010 2:06 PM)  O2 Flow:  Room air  Physical Exam  General:  Obese; well-developed; no acute distress: Weight-248, decreased 8 pounds since 2 wks ago HEENT-[place]/AT; PERRL; EOM intact; conjunctiva and lids nl:  Neck-No JVD; no carotid bruits: Endocrine-No thyromegaly: Lungs-No tachypnea, clear without rales, rhonchi or wheezes: CV-normal PMI; normal S1 and S2:;  Abdomen-BS normal; soft and non-tender without masses or organomegaly: MS-No deformities, cyanosis or clubbing: Neurologic-Nl cranial nerves; symmetric strength and tone: Skin- Warm, no sig. lesions: Extremities-Nl distal  pulses; no edema    Impression & Recommendations:  Problem # 1:  CHEST PAIN (ICD-786.50) Symptoms are responsive to sublingual nitroglycerin.  I doubt that she has ASCVD, but nitroglycerin is also effective in patients with variant angina or esophageal spasm.  As long as episodes of fairly infrequent, treatment with p.r.n. nitroglycerin is fine.  If frequency or severity increases, I would add amlodipine to her medical regime.  Problem # 2:  HYPERLIPIDEMIA (ICD-272.4) Records from [facility] revealed marked elevation of total and LDL cholesterol.  We will verify the patient was taken Lipitor at the time this test was obtained.  If so, she will require high-dose rosuvastatin therapy plus additional agents.  She is taking 2 forms of fenofibrate, a generic and trilipix.  I have suggested that she discontinue the latter.  Problem # 3:  HYPERTENSION (ICD-401.1) Blood pressure control is excellent; current medications will be continued.  Problem # 4:  DIABETES MELLITUS, TYPE II (ICD-250.00) I will leave adjustment of therapy for diabetes in the capable hands of [name]'s primary care team and plan to reassess this nice woman in 6 months.  Patient Instructions: 1)  Your physician recommends that you schedule a follow-up appointment in: 6 MONTHS 2)  CALL FOR MORE FREQUENT/ SEVERE CHEST DISCOMFORT

## 2010-07-11 NOTE — Progress Notes (Signed)
Summary: FMLA AND DISABILITY PAPERS  Phone Note Call from Patient Call back at Home Phone [phone]   Caller: Patient Call For: Doctor/Manager Reason for Call: Insurance Question Action Taken: Provider Notified Summary of Call: Patient came into the office for f/u on abd pain. She told me that she is getting ready to loose her job because we haven't filled out her disability paper work. I asked her who took her out of work and she stated Dr Jena Gauss took her out of work until he figured out what was wrong with her. I explained tp the patient that Dr Jena Gauss was onvacation this week and we would get back to her when he returns.Marland KitchenMarland KitchenMarland KitchenPlease advise?? Initial call taken by: Ave Filter,  March 29, 2010 3:08 PM     Appended Document: FMLA AND DISABILITY PAPERS Pt may have a work excuse from day of EGD through 03/29/10; flma (if really needed) should be handled by pcp; psyc and drdiology consults pending as well  Appended Document: FMLA AND DISABILITY PAPERS LMOM to call.  Appended Document: FMLA AND DISABILITY PAPERS Please see the note of 04/04/2010, FMLA, MM note.

## 2010-07-11 NOTE — Letter (Signed)
Summary: Patient Notice, Colon Biopsy Results  [facility]  [address]   [place], [place] [address]   Phone: [phone]  Fax: [phone]       March 16, 2010   CHELCEE KORPI [address] [place], [place]  [address] [birth year]    Dear Ms. Leger,  I am pleased to inform you that the biopsies taken during your recent colonoscopy and EGD did not show any evidence of cancer or other abnormaility.  They did reveal any explaination for your symptoms.  Your stool studies and blood work also looked good except for an elevated blood glucose.  Additional information/recommendations:  Please call [phone] to schedule a return visit to review your condition.  Please call us if you are having persistent problems or have questions about your condition that have not been fully answered at this time.  Sincerely,    R. Roetta Sessions MD, FACP [facility] Ph: [phone]    Fax: [phone]   Appended Document: Patient Notice, Colon Biopsy Results mailed letter to pt  Appended Document: Patient Notice, Colon Biopsy Results F/U OPV IS IN THE COMPUTER

## 2010-07-11 NOTE — Letter (Signed)
Summary: FMLA paperwork thru 03/29/10  FMLA paperwork thru 03/29/10   Imported By: Minna Merritts 04/04/2010 15:41:07  _____________________________________________________________________  External Attachment:    Type:   Image     Comment:   External Document

## 2010-07-11 NOTE — Miscellaneous (Signed)
Summary: cmp,lipids,tsh  Clinical Lists Changes  Observations: Added new observation of CALCIUM: 9.7 mg/dL (16/03/9603 54:09) Added new observation of ALBUMIN: 4.5 g/dL (81/19/1478 29:56) Added new observation of PROTEIN, TOT: 7.1 g/dL (21/30/8657 84:69) Added new observation of SGPT (ALT): 33 units/L (03/31/2010 13:19) Added new observation of SGOT (AST): 19 units/L (03/31/2010 13:19) Added new observation of ALK PHOS: 58 units/L (03/31/2010 13:19) Added new observation of BILI DIRECT: 0.08 mg/dL (62/95/2841 32:44) Added new observation of GFR AA: 103 mL/min/1.49m2 (03/31/2010 13:19) Added new observation of GFR: 90 mL/min (03/31/2010 13:19) Added new observation of CREATININE: 0.78 mg/dL (06/13/7251 66:44) Added new observation of BUN: 14 mg/dL (03/47/4259 56:38) Added new observation of BG RANDOM: 179 mg/dL (75/64/3329 51:88) Added new observation of CO2 PLSM/SER: 22 meq/L (03/31/2010 13:19) Added new observation of CL SERUM: 102 meq/L (03/31/2010 13:19) Added new observation of K SERUM: 4.5 meq/L (03/31/2010 13:19) Added new observation of NA: 140 meq/L (03/31/2010 13:19) Added new observation of LDL: 125 mg/dL (41/66/0630 16:01) Added new observation of HDL: 36 mg/dL (09/32/3557 32:20) Added new observation of TRIGLYC TOT: 263 mg/dL (25/42/7062 37:62) Added new observation of CHOLESTEROL: 367 mg/dL (83/15/1761 60:73) Added new observation of TSH: 2.280 microintl units/mL (03/31/2010 13:19) Added new observation of T4, FREE: 8.8 ng/dL (71/11/2692 85:46) Added new observation of T3 FREE: 31 pg/mL (03/31/2010 13:19)

## 2010-07-11 NOTE — Letter (Signed)
Summary: Stress Echocardiogram Information Sheet  [facility]  [address], [place] [address]   Phone: [phone]  Fax: [phone]      April 13, 2010 MRN: [id number] light prior to the test.   Ephraim Enrrique Mierzwa [facility]  Doctor: Appointment Date: Appointment Time: Appointment Location: [facility]  Stress Echocardiogram Information Sheet    Instructions:   1. DO NOT  take your  Propranolol   the morning of test.  2. Do not eat or drink after midnight the night before test.  3. Dress prepared to exercise.  4. DO NOT use ANY caffine or tobacco products 3 hours before appointment.  5. Report to the Short Stay Center on the1st floor.  6. Please bring all current prescription medications.  7. If you have any questions, please call [phone]

## 2010-07-11 NOTE — Assessment & Plan Note (Signed)
Summary: FU WITH EXTENDER PER RMR/ABD PAIN/PT TO SEE [name]/SS   Visit Type:  Follow-up Visit Primary Care Provider:  Bennie Pierini  Chief Complaint:  abd pain.  History of Present Illness: Pt presents today with continued  abdominal pain, located to the left and right of umbilicus. Also c/o chronic left-sided chest and epigastric pain. Chest pain worsened after eating, denies SOB with pain. Denies nausea. Abdominal pain worsened with movement. History of chronic abdominal pain, s/p multiple abdominal operations including hernia repairs with mesh. Has completed a thorough work-up including a CT which was essentially normal, stool studies, labs, EGD and TSC on 03/10/10 that was WNL and showed a single diminutive colonic polyp, biopsies negative. Daily BM, soft. No hematochezia or melena. Was taking Bentyl 10 mg qac and at bedtime, however, stated Dr. Jena Gauss prescribed her clidinium/chlordiazepoxide 2.5/5 QID. On SSRI.   Current Medications (verified): 1)  Metformin Hcl 500 Mg Tabs (Metformin Hcl) .... Take 1 Tablet By Mouth Two Times A Day 2)  Fenofibrate 160 Mg Tabs (Fenofibrate) .... Take 1 Tablet By Mouth Once A Day 3)  Benazepril Hcl 20 Mg Tabs (Benazepril Hcl) .... Take 1 Tablet By Mouth Once A Day 4)  Levothroid 75 Mcg Tabs (Levothyroxine Sodium) .... Take 1 Tablet By Mouth Once A Day 5)  Dexilant 60 Mg Cpdr (Dexlansoprazole) .... Take 1 Tablet By Mouth Once A Day 6)  Sertraline Hcl 100 Mg Tabs (Sertraline Hcl) .... Take 1 Tablet By Mouth Once A Day 7)  Propranolol Hcl 40 Mg Tabs (Propranolol Hcl) .... Take 1 Tablet By Mouth Two Times A Day 8)  Vitamin D 95284 Units .... One Tablet Weekly 9)  Trilipix 135 Mg Cpdr (Choline Fenofibrate) .... One Capsule Daily 10)  Lipitor 40 Mg Tabs (Atorvastatin Calcium) .... Take 1 Tablet By Mouth Once A Day 11)  Topiramate 50 Mg Tabs (Topiramate) .... Take 1 Tablet By Mouth Two Times A Day 12)  Dicyclomine Hcl 10 Mg Caps (Dicyclomine Hcl) ....  One By Mouth Qac and At Bedtime For Diarrhea, Abd Pain. Hold For Constipation 13)  Hydrocodone-Acetaminophen 5-500 Mg Tabs (Hydrocodone-Acetaminophen) .... One By Mouth Every 4-6 Hours As Needed Severe Pain Only 14)  Clidinium-Chlordiazepoxide 2.5-5 Mg Caps (Clidinium-Chlordiazepoxide) .... Take One Tablet Before Meals and At Bedtime As Needed For Abdominal Pain  Allergies (verified): 1)  ! Benadryl 2)  ! Jonne Ply  Past History:  Past Medical History: Drug overdose, 2009, 60 Naprosyn, psychiatric admission Migraines Anxiety Disorder Asthma Depression Diabetes Hyperlipidemia Hypertension Hypothyroidism EGD/Colonoscopy 03/10/10 with RMR: normal upper endoscopy, single colonic polyp, biopsy normal  Past Surgical History: Reviewed history from 02/23/2010 and no changes required. Hysterectomy with lysis of adhesions, 9/08 Both tubes/ovarians removed in multiple surgeries prior to 9/08 Ventral hernia repair X 3 appendectomy, ruptured, late 90s  Review of Systems General:  Denies fever, chills, and anorexia. Eyes:  Denies blurring, irritation, and discharge. ENT:  Denies sore throat, hoarseness, and difficulty swallowing. CV:  Complains of chest pains; denies palpitations and dyspnea on exertion; left-sided discomfort with eating, epigastric pain. Resp:  Denies dyspnea at rest, cough, and wheezing. GI:  Complains of abdominal pain; denies difficulty swallowing, pain on swallowing, and nausea. GU:  Denies urinary burning and blood in urine. MS:  Denies joint pain / LOM, joint swelling, and joint stiffness. Derm:  Denies rash, itching, and dry skin. Neuro:  Denies weakness, paralysis, and syncope. Psych:  Complains of depression and anxiety; denies memory loss. Endo:  Denies cold intolerance and  heat intolerance.  Vital Signs:  Patient profile:   50 year old female Height:      63.5 inches Weight:      252 pounds BMI:     44.10 Temp:     97.9 degrees F oral Pulse rate:   72 /  minute BP sitting:   130 / 80  (left arm) Cuff size:   large  Vitals Entered By: Cloria Spring LPN (March 29, 2010 1:41 PM)  Physical Exam  General:  Well developed, well nourished, no acute distress.obese.   Eyes:  conjuctive clear,  no icterus. Lungs:  Clear throughout to auscultation. Heart:  Regular rate and rhythm; no murmurs, rubs,  or bruits. Abdomen:  normal bowel sounds, obese, without guarding, without rebound, no tenderness, and no masses.   Msk:  Symmetrical with no gross deformities. Normal posture. Pulses:  Normal pulses noted. Extremities:  No clubbing, cyanosis, edema or deformities noted. Neurologic:  Alert and  oriented x4;  grossly normal neurologically.  Impression & Recommendations:  Problem # 1:  ABDOMINAL PAIN, CHRONIC (ICD-41.61)  50 year old Caucasian female with likely functional abdominal pain, work-up has been negative thus far. Stopped Bentyl, now on clidinium/chlordiazepoxide 2.5/5 QID. Suspect pain and other GI symptoms multifactorial. She has diarrhea/gerd/abd pain in setting of psychiatric disease. Discussed with her options of trying different PPI, PT referral. If no better in couple of weeks may need to consider tertiary care referral. Will discuss options with Dr. Jena Gauss upon his return next week.   Continue current plan Add Restora, 7 samples given to patient PT referral for chronic pain  Orders: Est. Patient Level II (24401)  Problem # 2:  CHEST PAIN UNSPECIFIED (ICD-786.50)  Left-sided chest discomfort worsened with eating; no current cardiac work-up. EGD negative. Currently taking Dexilant without relief. Requests cheaper alternative.  Nexium 40 mg daily, 2 week's worth. Pt to call if no improvement or change Instructed to follow-up with PCP for possible cardiac etiology, although unlikely   Orders: Est. Patient Level II (02725)  Patient Instructions: 1)  Continue current management 2)  Follow-up with PCP for complete cardiac  work-up 3)  Change Dexilant to Nexium 40 mg daily, samples given 4)  Call office if no improvement 5)  Restora samples given 6)  PT referral for pain management

## 2010-07-13 NOTE — Progress Notes (Signed)
Summary: Dr. Lennie Muckle correspondence needed on pt  Contacted Dr. Lennie Muckle and asked if there was any information I could provide at this time.  He stated report had already been sent in and he no longer needed Dr. Jena Gauss to contact him regarding the disability paperwork.  He was very pleasant and thanked [place] for returning his call.  No further action required at this time.   ---- Converted from flag ---- ---- 05/31/2010 3:59 PM, Jonathon Bellows MD, Caleen Essex wrote: thx; will wait to hear from you  ---- 05/31/2010 3:00 PM, Minna Merritts wrote: Not sure.  I will contact him and derive nature of call or answer any Qs I can and then will forward response / need to contact based on that.  Thanks.  ---- 05/30/2010 3:33 PM, Jonathon Bellows MD, Caleen Essex wrote: Do I have written permission from pt to talk to this guy?  ---- 05/30/2010 12:01 PM, Diana Eves wrote: I have had several calls from Dr. Lennie Muckle. He said it would take just a few minutes of your time. His number in [phone]. Regarding Inza Heckart's disability. ------------------------------

## 2010-07-13 NOTE — Progress Notes (Signed)
  Phone Note From Other Clinic   Caller: Physician Call For: Dr. Hooppole Bing Details for Reason: Disability evaluation Summary of Call: Call from Dr. Zoila Shutter, a cardiologist in [place] [phone]), who is reviewing patient's disability claim after an appeal.  I explained the patient has never had coronary angiography, but has multiple cardiovascular risk factors and chest discomfort responsive to nitroglycerin.  At a recent office visit, chest discomfort was well controlled and not interfering with her ADLs.  Cardiac catheterization would be necessary to establish a cardiovascular cause for disability benefits. Initial call taken by: Kathlen Brunswick, MD, [facility],  May 29, 2010 11:58 AM

## 2010-07-13 NOTE — Progress Notes (Signed)
Summary: Disability MD trying to contact [facility] for info/follow up.  ---- Converted from flag ---- ---- 05/31/2010 3:00 PM, Minna Merritts wrote: I will contact him and derive nature of call or answer any Qs I can and then will forward response / need to contact based on that.  Thanks.  ---- 05/30/2010 3:33 PM, Jonathon Bellows MD, Caleen Essex wrote: Do I have written permission from pt to talk to this guy?  ---- 05/30/2010 12:01 PM, Diana Eves wrote: I have had several calls from Dr. Lennie Muckle. He said it would take just a few minutes of your time. His number in [phone]. Regarding Kaly Kanan's disability. ------------------------------

## 2010-07-13 NOTE — Letter (Signed)
Summary: [name] OFFICE VISIT  [facility] OFFICE VISIT   Imported By: Rexene Alberts 05/31/2010 09:41:50  _____________________________________________________________________  External Attachment:    Type:   Image     Comment:   External Document

## 2010-08-08 ENCOUNTER — Encounter: Payer: Self-pay | Admitting: Internal Medicine

## 2010-08-17 NOTE — Letter (Signed)
Summary: OUTPT REHAB CENTER  OUTPT [facility]   Imported By: Rexene Alberts 08/08/2010 15:54:48  _____________________________________________________________________  External Attachment:    Type:   Image     Comment:   External Document

## 2010-08-24 LAB — STOOL CULTURE

## 2010-08-24 LAB — CLOSTRIDIUM DIFFICILE EIA: C difficile Toxins A+B, EIA: NEGATIVE

## 2010-08-24 LAB — GLUCOSE, CAPILLARY: Glucose-Capillary: 155 mg/dL — ABNORMAL HIGH (ref 70–99)

## 2010-08-24 LAB — BASIC METABOLIC PANEL
CO2: 24 mEq/L (ref 19–32)
Chloride: 105 mEq/L (ref 96–112)
Creatinine, Ser: 0.88 mg/dL (ref 0.4–1.2)
GFR calc non Af Amer: >60 mL/min (ref >60)

## 2010-08-24 LAB — HEMOGLOBIN AND HEMATOCRIT, BLOOD: Hemoglobin: 12.5 g/dL (ref 12.0–15.0)

## 2010-08-24 LAB — FECAL LACTOFERRIN, QUANT

## 2010-08-24 LAB — OVA AND PARASITE EXAMINATION: Ova and parasites: NONE SEEN

## 2010-09-14 LAB — DIFFERENTIAL
Eosinophils Relative: 2 % (ref 0–5)
Lymphocytes Relative: 31 % (ref 12–46)
Lymphs Abs: 2.6 10*3/uL (ref 0.7–4.0)
Neutro Abs: 5.4 10*3/uL (ref 1.7–7.7)
Neutrophils Relative %: 62 % (ref 43–77)

## 2010-09-14 LAB — CBC
MCHC: 33.7 g/dL (ref 30.0–36.0)
MCV: 80.3 fL (ref 78.0–100.0)
RBC: 4.76 MIL/uL (ref 3.87–5.11)
RDW: 16 % — ABNORMAL HIGH (ref 11.5–15.5)
WBC: 8.6 10*3/uL (ref 4.0–10.5)

## 2010-09-14 LAB — COMPREHENSIVE METABOLIC PANEL WITH GFR
ALT: 51 U/L — ABNORMAL HIGH (ref 0–35)
AST: 40 U/L — ABNORMAL HIGH (ref 0–37)
Albumin: 4.6 g/dL (ref 3.5–5.2)
Alkaline Phosphatase: 62 U/L (ref 39–117)
BUN: 12 mg/dL (ref 6–23)
CO2: 26 meq/L (ref 19–32)
Calcium: 9.9 mg/dL (ref 8.4–10.5)
Chloride: 102 meq/L (ref 96–112)
Creatinine, Ser: 0.66 mg/dL (ref 0.4–1.2)
GFR calc non Af Amer: >60 mL/min
Glucose, Bld: 136 mg/dL — ABNORMAL HIGH (ref 70–99)
Potassium: 4.4 meq/L (ref 3.5–5.1)
Sodium: 137 meq/L (ref 135–145)
Total Bilirubin: 0.8 mg/dL (ref 0.3–1.2)
Total Protein: 7.8 g/dL (ref 6.0–8.3)

## 2010-10-17 ENCOUNTER — Encounter: Payer: Self-pay | Admitting: *Deleted

## 2010-10-20 ENCOUNTER — Telehealth: Payer: Self-pay

## 2010-10-20 ENCOUNTER — Encounter: Payer: Self-pay | Admitting: *Deleted

## 2010-10-20 ENCOUNTER — Encounter: Payer: Self-pay | Admitting: Cardiology

## 2010-10-20 ENCOUNTER — Ambulatory Visit (INDEPENDENT_AMBULATORY_CARE_PROVIDER_SITE_OTHER): Payer: 59 | Admitting: Cardiology

## 2010-10-20 DIAGNOSIS — E785 Hyperlipidemia, unspecified: Secondary | ICD-10-CM

## 2010-10-20 DIAGNOSIS — E1169 Type 2 diabetes mellitus with other specified complication: Secondary | ICD-10-CM | POA: Insufficient documentation

## 2010-10-20 DIAGNOSIS — T50901A Poisoning by unspecified drugs, medicaments and biological substances, accidental (unintentional), initial encounter: Secondary | ICD-10-CM | POA: Insufficient documentation

## 2010-10-20 DIAGNOSIS — D126 Benign neoplasm of colon, unspecified: Secondary | ICD-10-CM

## 2010-10-20 DIAGNOSIS — R109 Unspecified abdominal pain: Secondary | ICD-10-CM

## 2010-10-20 DIAGNOSIS — G8929 Other chronic pain: Secondary | ICD-10-CM | POA: Insufficient documentation

## 2010-10-20 DIAGNOSIS — F329 Major depressive disorder, single episode, unspecified: Secondary | ICD-10-CM

## 2010-10-20 DIAGNOSIS — E039 Hypothyroidism, unspecified: Secondary | ICD-10-CM

## 2010-10-20 DIAGNOSIS — E119 Type 2 diabetes mellitus without complications: Secondary | ICD-10-CM

## 2010-10-20 DIAGNOSIS — I10 Essential (primary) hypertension: Secondary | ICD-10-CM | POA: Insufficient documentation

## 2010-10-20 NOTE — Progress Notes (Signed)
HPI : Ms. Lieber returns to the office for continued assessment and treatment of chest discomfort and hyperlipidemia.  Since her last visit, she is doing well from a cardiac standpoint.  She has occasional brief fairly mild episodes of sharp localized chest discomfort for which she rarely takes sublingual nitroglycerin, perhaps once per month.  Unfortunately, she required psychiatric admission for severe depression with a suicide attempt involving a narcotics overdose.  With adjustment of medication and establishment of an ongoing relationship with a psychiatrist, she is markedly improved in terms of her mood and outlook.  She reports slacking off on exercise, but plans to resume swimming.  She reports a high quality diet, but realizes that she ingests excessive calories.  Current Outpatient Prescriptions on File Prior to Visit  Medication Sig Dispense Refill  . atorvastatin (LIPITOR) 40 MG tablet Take 40 mg by mouth daily.        . benazepril (LOTENSIN) 20 MG tablet Take 20 mg by mouth daily.        . DULoxetine (CYMBALTA) 60 MG capsule Take 60 mg by mouth daily.        . ergocalciferol (VITAMIN D2) 50000 UNITS capsule Take 50,000 Units by mouth once a week.        . fenofibrate 160 MG tablet Take 160 mg by mouth daily.        Marland Kitchen levothyroxine (LEVOTHROID) 75 MCG tablet Take 75 mcg by mouth daily.        . nitroGLYCERIN (NITROSTAT) 0.4 MG SL tablet Place 0.4 mg under the tongue every 5 (five) minutes as needed.        . propranolol (INDERAL) 40 MG tablet Take 40 mg by mouth 2 (two) times daily.        Marland Kitchen topiramate (TOPAMAX) 50 MG tablet Take 50 mg by mouth 2 (two) times daily.        Marland Kitchen DISCONTD: Choline Fenofibrate 135 MG capsule Take 135 mg by mouth daily.        Marland Kitchen DISCONTD: clonazePAM (KLONOPIN) 0.5 MG tablet Take 0.5 mg by mouth 3 (three) times daily as needed.        Marland Kitchen DISCONTD: hydrocodone-acetaminophen (LORCET-HD) 5-500 MG per capsule Take 1 capsule by mouth every 6 (six) hours as needed.         Marland Kitchen DISCONTD: metFORMIN (GLUCOPHAGE) 500 MG tablet Take 500 mg by mouth 2 (two) times daily with a meal.           Allergies  Allergen Reactions  . Aspirin   . Diphenhydramine Hcl       Past medical history, social history, and family history reviewed and updated.  ROS: Denies orthopnea, PND, lightheadedness, syncope, exertional dyspnea, cough or sputum production.  PHYSICAL EXAM: BP 131/71  Pulse 77  Ht 5\' 4"  (1.626 m)  Wt 251 lb (113.853 kg)  BMI 43.08 kg/m2  SpO2 93% ; 3 pound increase in weight since last visit General-Well developed; no acute distress Body habitus-proportionate weight and height Neck-No JVD; no carotid bruits Lungs-clear lung fields; resonant to percussion Cardiovascular-normal PMI; distant S1 and S2; grade 2/6 early peaking systolic ejection murmur at the cardiac base Abdomen-normal bowel sounds; soft and non-tender without masses or organomegaly Musculoskeletal-No deformities, no cyanosis or clubbing Neurologic-Normal cranial nerves; symmetric strength and tone Skin-Warm, no significant lesions Extremities-distal pulses intact; trace edema  ASSESSMENT AND PLAN:

## 2010-10-20 NOTE — Assessment & Plan Note (Addendum)
Patient reports fairly good control of blood glucose based upon CBG values, which tend to be approximally 120 in the morning and 160 in the afternoon.  A1c value will be obtained to verify that treatment of diabetes is adequate.

## 2010-10-20 NOTE — Assessment & Plan Note (Signed)
Blood pressure control is good with her minimal antihypertensive regime, which will be continued.

## 2010-10-20 NOTE — Patient Instructions (Signed)
Your physician recommends that you schedule a follow-up appointment in: 10 months Your physician recommends that you return for lab work in: next week Your physician discussed the importance of regular exercise and recommended that you start or continue a regular exercise program for good health.

## 2010-10-20 NOTE — Assessment & Plan Note (Signed)
Most recent lipid profile with total cholesterol of 367 apparently was obtained prior to initiation of treatment with atorvastatin.  A repeat lipid profile will be obtained as well as a CBC, chemistry profile and TSH level.

## 2010-10-22 ENCOUNTER — Encounter: Payer: Self-pay | Admitting: Cardiology

## 2010-10-23 ENCOUNTER — Other Ambulatory Visit: Payer: Self-pay | Admitting: Cardiology

## 2010-10-24 ENCOUNTER — Encounter: Payer: Self-pay | Admitting: *Deleted

## 2010-10-24 LAB — CBC WITH DIFFERENTIAL/PLATELET
Basophils Absolute: 0 10*3/uL (ref 0.0–0.1)
Basophils Relative: 0 % (ref 0–1)
Lymphocytes Relative: 35 % (ref 12–46)
MCHC: 31.9 g/dL (ref 30.0–36.0)
Neutro Abs: 4.4 10*3/uL (ref 1.7–7.7)
Platelets: 256 10*3/uL (ref 150–400)
RDW: 15.1 % (ref 11.5–15.5)
WBC: 7.6 10*3/uL (ref 4.0–10.5)

## 2010-10-24 LAB — LIPID PANEL
HDL: 39 mg/dL — ABNORMAL LOW (ref >39)
Triglycerides: 139 mg/dL (ref <150)

## 2010-10-24 LAB — COMPREHENSIVE METABOLIC PANEL
ALT: 42 U/L — ABNORMAL HIGH (ref 0–35)
AST: 21 U/L (ref 0–37)
Albumin: 4.3 g/dL (ref 3.5–5.2)
CO2: 19 mEq/L (ref 19–32)
Calcium: 9.7 mg/dL (ref 8.4–10.5)
Chloride: 109 mEq/L (ref 96–112)
Potassium: 4.3 mEq/L (ref 3.5–5.3)
Sodium: 140 mEq/L (ref 135–145)
Total Protein: 6.6 g/dL (ref 6.0–8.3)

## 2010-10-24 LAB — TSH: TSH: 1.493 u[IU]/mL (ref 0.350–4.500)

## 2010-10-24 NOTE — Progress Notes (Signed)
Result letter mailed to pt.

## 2010-10-24 NOTE — Discharge Summary (Signed)
[name], DANIS NO.:  [id number]   MEDICAL RECORD NO.:  [id number]          PATIENT TYPE:  IPS   LOCATION:  0504                          [facility]ILITY[facility]  [facility]   PHYSICIAN:  Jasmine Pang, M.D. DATE OF BIRTH:  20-Jun-1960   DATE OF ADMISSION:  10/16/2007  DATE OF DISCHARGE:  10/20/2007                               DISCHARGE SUMMARY   IDENTIFICATION:  This is a 50 year old married female who was  voluntarily admitted on Oct 16, 2007.   HISTORY OF PRESENT ILLNESS:  The patient reports that she overdosed on  60 Naprosyn tablets, they were her own medication that she takes for  headaches.  She states it was an impulsive overdose.  She had been  called a friend who was going to call the police if she did not get some  help.  She also called the patient's husband.  She has been under a lot  of stress.  Her father died recently.  Her father died on her birthday.  He died in a difference state.  He had to be cremated and then brought  back here.  They have not even had a service for him yet.  She states  her husband is very distant and that he has been for sometime.  They  have financial problems and she and her husband both have multiple  medical problems.  She is glad, she survived the overdose.  Her faith is  a deterrent to further suicide attempts.   PAST PSYCHIATRIC HISTORY:  This is the first admission to [facility].  There is no history of having any current outpatient  treatment.   FAMILY HISTORY:  None that we are aware.   ALCOHOL AND DRUG HISTORY:  The patient denies any alcohol or drug use.   MEDICAL PROBLEMS:  Non-insulin-dependent diabetes, hypertension, and  migraines.   MEDICATIONS:  1. Naprosyn b.i.d.  2. Metformin 500 mg b.i.d.  3. Effexor XR 300 mg daily.  4. Zocor 40 mg daily.   DRUG ALLERGIES:  1. ASPIRIN.  2. MORPHINE.  3. BENADRYL.   PHYSICAL EXAM:  The patient was fully assessed at the [facility] ED.  There were no  acute physical or medical problems noted.   DIAGNOSTIC STUDIES:  Urinalysis shows 21 to 50 RBC's.  Hemoglobin was  11.9 and hematocrit 36.6.  Alcohol level was less than 5.  Glucose 188.  Lipase of 124.  Salicylate less than 4.  Urine drug screen was negative.   HOSPITAL COURSE:  Upon admission, the patient was continued on Zocor 40  mg p.o. daily and Glucophage 500 mg p.o. b.i.d.  She was also continued  on Effexor XR 300 mg p.o. daily and benazepril 20 mg p.o. daily and  naproxen two 500 mg tablets now x1.  In individual sessions with me, the  patient was very tearful and depressed.  She admitted to an overdose on  naproxen.  She had difficulty first attending unit therapeutic groups  and activities.  As hospitalization progressed, the patient's mental  status improved.  She had a family session with her husband and husband  admitted he had had enough of the patient's lying, excessive spending,  and yelling at him.  The patient admitted that her husband was being  honest.  Husband would not commit to a time table for the patient to be  compliant with husband's request.  He wanted her to change.  Husband was  sent to counseling and stated that the patient was honest and quit  spending excessively.  They would not need counseling.  The patient was  tearful at times and she planned to start an IOP program.  On Oct 20, 2007, mental status had improved markedly from admission status.  The  patient was less depressed and less anxious.  Her affect was consistent  with mood.  There was no suicidal or homicidal ideation.  No thoughts of  self-injurious behavior.  No auditory or visual hallucinations.  No  paranoia or delusions.  Thoughts were logical and goal-directed.  Thought content no predominant theme.  Cognitive was grossly back to  baseline.  It was felt the patient was safe for discharge today.  The  Effexor was increased to 225 mg p.o. daily.   DISCHARGE DIAGNOSES:  Axis I:  Major  depressive disorder recurrent,  severe.  Axis II:  None.  Axis III:  Hypertension, migraines, and diabetes.  Axis IV:  Severe (medical problems, financial problems, problems with  primary support group, other psychosocial problems related to recent  grief with the passing of her father).  Axis V:  Global assessment of functioning was 50 upon discharge.  GAF  upon admission was 40.  GAF highest past year was 60 to 65.   DISCHARGE PLANS:  There were no activity level or dietary restrictions.   POSTHOSPITAL CARE PLANS:  The patient will be seen at the [facility]  Intensive Outpatient Program on Oct 21, 2007, at 8:30 a.m.   DISCHARGE MEDICATIONS:  1. Effexor XR 300 mg daily.  2. Zocor 40 mg daily.  3. Glucophage 500 mg twice a day.  4. Amlodipine.  5. Benazepril as directed and she is to contact her family doctor to      renew her medications for her health problems at the [facility].      Jasmine Pang, M.D.  Electronically Signed     BHS/MEDQ  D:  11/20/2007  T:  11/21/2007  Job:  [id number]

## 2010-10-24 NOTE — H&P (Signed)
[name], SHUTTERS NO.:  [id number]   MEDICAL RECORD NO.:  [id number]          PATIENT TYPE:  IPS   LOCATION:  0400                          FACILITY:  [facility]   PHYSICIAN:  Jasmine Pang, M.D. DATE OF BIRTH:  Jul 29, 1960   DATE OF ADMISSION:  10/16/2007  DATE OF DISCHARGE:                       PSYCHIATRIC ADMISSION ASSESSMENT   This is a 50 year old female who that was voluntarily admitted on  Oct 16, 2007.   HISTORY OF PRESENT ILLNESS:  The patient reports that she overdosed on  60 Naprosyn tablets.  They were her own medication she takes for her  headaches.  She states it was an impulsive overdose.  She had then  called a friend who was going to call the police if she did not get some  help.  Also called the patient's husband.  She has been under a lot of  stress.  Her father died recently.  Her father died on her birthday.  He  died in a different state.  He had to be cremated and then and brought  back here.  They have not even had a service for him yet.  She states  her husband is very distant and he has been for some time.  They have  financial problems and she and her husband both have multiple medical  problems.  She is glad that she survived the overdose.  Her faith is a  deterrent for further suicide attempts.   PAST PSYCHIATRIC HISTORY:  First admission to [facility].  No history of having any current outpatient during of therapy  appointments.   SOCIAL HISTORY:  A 50 year old married female.  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  She has no children.   FAMILY HISTORY:  None that we are aware of.   ALCOHOL AND DRUG HISTORY:  She denies any alcohol or drug use.   PRIMARY CARE Brita Jurgensen:  [facility]   MEDICAL PROBLEMS:  1. Non-insulin-dependent diabetes.  2. Hypertension.  3. Migraine headaches.   MEDICATIONS:  The patient has been on:  1. Naprosyn b.i.d.  2. Metformin 500  mg b.i.d.  3. Effexor XR 300 mg daily.  4. Zocor 40 mg daily.   DRUG ALLERGIES:  ASPIRIN, MORPHINE AND BENADRYL.   PHYSICAL EXAM:  This is a middle-aged female who was fully assessed at  [facility].  Temperature is 97, 70 heart rate, 18 respirations, blood pressure is  140/90, 5 feet, 2 inches tall, 117 kg.   Urinalysis shows 21-50 RBCs, hemoglobin 11.9, hematocrit 36.6.  Alcohol  level less than 5.  Glucose of 188, lipase of 24.  Salicylate less than  4.  Urine drug screen was negative.   MENTAL STATUS EXAM:  This is a middle-aged lady appropriately dressed,  cooperative, fair eye contact. Her speech is clear, normal pace and  tone.  The patient's mood is depressed.  The patient appears sad and  depressed as well, some mild anxiety.  The patient also became tearful  at the time talking about her father's passing.  Thought process are  coherent.  No  evidence of any delusional thinking.  No current suicidal  thoughts.  Cognitive function intact.  Her memory is good.  Judgment  insight is fair.  She appears sincere.   AXIS I:  Major depressive disorder, severe.   AXIS II:  Deferred.   AXIS III:  1. She is status post overdose of naproxen.  2. Hypertension.  3. Migraines.  4. Diabetes.   AXIS IV:  1. Medical problems.  2. Financial problems.  3. Problems with primary support group.  4. Other psychosocial problems related to recent grief with passing of      her father.   AXIS V:  Current is 35-40.   PLANS:  1. Contract for safety.  2. We will stabilize mood and thinking.  3. We will resume her medications.  4. We will check her CBG.  5. The patient may need some individual therapy.  6. Case manager will obtain her follow-up.  7. Her tentative length of stay is 3-5 days.      Landry Corporal, N.P.      Jasmine Pang, M.D.  Electronically Signed    JO/MEDQ  D:  10/17/2007  T:  10/17/2007  Job:  [id number]

## 2010-10-24 NOTE — H&P (Signed)
[name], [name]          ACCOUNT NO.:  [id number]   MEDICAL RECORD NO.:  [id number]          PATIENT TYPE:  AMB   LOCATION:  SDC                           [facility]ILITY:  [facility]   PHYSICIAN:  Zenaida Niece, M.D.DATE OF BIRTH:  May 04, 1961   DATE OF ADMISSION:  02/12/2007  DATE OF DISCHARGE:                              HISTORY & PHYSICAL   CHIEF COMPLAINT:  Abnormal uterine bleeding and pelvic pain.   HISTORY OF PRESENT ILLNESS:  This is a 50 year old female, gravida 0,  who was referred to me by [facility].  The  patient complains of lower abdominal pain, worse for the past 2 months,  which is on the right greater than the left.  The pain is intermittent  and sudden.  She also complains of vaginal bleeding off and on for 2  months, with an occasional clot.  She was initially on an estrogen patch  and monthly progesterone, but stopped that 2-3 months ago.  She had an  endometrial biopsy which revealed no endometrial tissue.  She has  previously had both tubes and ovaries removed in several surgeries.  Physical exam significant for tender bilateral lower quadrants and a  vertical scar on abdominal exam.  Pelvic exam reveals a small midplanar  uterus which is possibly tender, and slightly tender bilateral adnexa,  without masses.  She had a pelvic ultrasound which revealed a normal  uterus with a normal endometrium and a small fibroid.  Due to her  irregular bleeding and pelvic pain, the patient wishes to undergo  laparoscopy with removal of the uterus at the same time and is being  admitted for this at this time.   PAST MEDICAL HISTORY:  1. Hypertension.  2. Hypercholesterolemia.  3. Diabetes.  4. Asthma.  5. Migraines.   PAST SURGICAL HISTORY:  1. In 1997, she had a laparoscopy with right salpingo-oophorectomy.  2. She then had an abdominal wall hernia repair.  3. Most recently, she had a laparotomy/laparoscopy with left salpingo-  oophorectomy and repair of another abdominal hernia.   ALLERGIES:  1. MORPHINE.  2. ASPIRIN.   CURRENT MEDICATIONS:  Effexor, Januvia, blood pressure medication which  she was not sure of, an albuterol inhaler, and Darvocet p.r.n. pain.   GYN HISTORY:  No history of abnormal Pap smears or sexually transmitted  diseases, with a normal Pap smear in 2007.   FAMILY HISTORY:  Maternal aunt with breast cancer.   SOCIAL HISTORY:  The patient is married, and denies alcohol, tobacco, or  drug use.   REVIEW OF SYSTEMS:  She has no dyspareunia, lesions, or discharge.  She  does have some urinary frequency and normal bowel movements.  She has  had rectal bleeding in the past, with a normal colonoscopy.   PHYSICAL EXAMINATION:  GENERAL:  This is a slightly overweight female in  no acute distress.  VITAL SIGNS:  Weight is 244 pounds, blood pressure 130/70.  NECK:  Supple, without lymphadenopathy or thyromegaly.  LUNGS:  Clear to auscultation.  HEART:  Regular rate and rhythm, without murmur.  ABDOMEN:  Soft, nondistended, without palpable masses.  She  is slightly  tender in both lower quadrants, and does have a well-healed vertical  scar.  EXTREMITIES:  Have no edema and are nontender.  PELVIC:  Reveals normal external genitalia.  On speculum exam, the  vagina and small cervix are normal.  Bimanual exam shows a small  midplanar uterus, tender bilateral adnexa, without a palpable mass.   ASSESSMENT:  Pelvic pain and abnormal bleeding.  All nonsurgical and  surgical options have been discussed with the patient, and she wishes to  proceed with laparoscopy with supracervical hysterectomy and probable  lysis of adhesions.  All risks of surgery have been discussed with the  patient, and she understands.  I have discussed the patient with Dr.  Darnell Level, who was involved with her hernia repair.  He feels as long  as I do an open laparoscopy and repair the fascial/mesh defect with  permanent  suture, it should not be an issue.   PLAN:  To admit the patient on the day of surgery for a laparoscopic  supracervical hysterectomy and probable adhesiolysis.      Zenaida Niece, M.D.  Electronically Signed     TDM/MEDQ  D:  02/11/2007  T:  02/11/2007  Job:  [id number]

## 2010-10-27 NOTE — H&P (Signed)
NAMEBYRON, [name]          ACCOUNT NO.:  [id number]   MEDICAL RECORD NO.:  [id number]          PATIENT TYPE:  INP   LOCATION:  9315                          [facility]ILITY:  [facility]   PHYSICIAN:  Roseanna Rainbow, M.D.DATE OF BIRTH:  1960-06-22   DATE OF ADMISSION:  12/20/2005  DATE OF DISCHARGE:                                HISTORY & PHYSICAL   CHIEF COMPLAINT:  The patient is a 50 year old Caucasian female, gravida 0,  with acute left lower quadrant pain and an adnexal mass.   HISTORY OF PRESENT ILLNESS:  The patient had presented to Dr. Frederica Kuster on December 19, 2005 with similar complaints.  A subsequent CT scan of the  abdomen and pelvis was suspicious for an ovarian mass complicated by  torsion.  She was then evaluated in the maternity admissions unit at [facility].  Workup at that point included a pelvic ultrasound with Doppler  flow.  The impression at this point was a 5-cm hemorrhagic left ovarian  cyst.  The patient was discharged to home.  She presented to Dr. Magnus Sinning.  [place] today with worsening pain and was then sent our office.  The pain is  characterized as crampy, crescendo/decrescendo, the severity is moderate.  The duration of each episode is variable as well as the frequency.  There  are no clearly defined precipitating factors.  She also described associated  diarrhea.  She had not taken any analgesics.  However, she has received an  IM dose of Toradol in the maternity admissions unit yesterday.  She does  have a history of follicular cysts and is status post a right salpingo-  oophorectomy for a ruptured ovarian cyst.   ALLERGIES:  ASPIRIN, BENADRYL.   MEDICATIONS:  Effexor, DHE, Atacand, Naprosyn, Ferrous Sulfate, vitamin-C,  calcium, multivitamins.   PAST MEDICAL HISTORY:  Depression, asthma, hypertension, headache.   PAST SURGICAL HISTORY:  Appendectomy for ruptured appendix, herniorrhaphy.   SOCIAL HISTORY:  She is married and living  with her spouse.  She works at  [employer].  She has no significant smoking history.  Does not give  any significant history of alcohol usage.  Denies illicit drug use.   FAMILY HISTORY:  Cerebrovascular accident, heart disease, hypertension,  myocardial infarction, epilepsy.   PAST GYNECOLOGICAL HISTORY:  Please see the above.  Her menstrual flow is  described as heavy and she reports severe dysmenorrhea.  Last Pap smear 1  year prior.   PAST OBSTETRICAL HISTORY:  The patient has never been pregnant.   PHYSICAL EXAMINATION:  VITAL SIGNS:  Temperature 98.5.  Pulse 94.  Blood  pressure 134/81.  Weight 270 pounds.  Height 5-feet, 4-inches.  GENERAL:  Obese, Caucasian female in mild distress.  ABDOMEN:  Obese.  There is left lower quadrant tenderness, however, there is  no rebound.  There is voluntary guarding.  PELVIC EXAM:  The external female genitalia are normal appearing.  On  speculum exam the vagina is clean.  The bimanual exam is limited by the  patient's body habitus.  There is mild cervical motion tenderness.  The  adnexae are nonpalpable as well  as the uterus is nonpalpable.   ASSESSMENT:  Left sided adnexal mass.  Hemorrhagic cyst.  Rule out a leaking  hemorrhagic cyst versus suspicion for torsion.   PLAN:  Observation, serial exams, labs.      Roseanna Rainbow, M.D.  Electronically Signed     LAJ/MEDQ  D:  12/20/2005  T:  12/20/2005  Job:  [id number]

## 2010-10-27 NOTE — Op Note (Signed)
NAMESKYELER, SCALESE          ACCOUNT NO.:  [id number]   MEDICAL RECORD NO.:  [id number]          PATIENT TYPE:  INP   LOCATION:  9320                          FACILITY:  [facility]   PHYSICIAN:  Leonie Man, M.D.   DATE OF BIRTH:  1960-09-04   DATE OF PROCEDURE:  01/03/2006  DATE OF DISCHARGE:                                 OPERATIVE REPORT   PREOPERATIVE DIAGNOSIS:  Left ovarian mass, status post laparoscopic ventral  hernia repair.   POSTOPERATIVE DIAGNOSES:  Left ovarian mass, status post laparoscopic  ventral hernia repair.   PROCEDURE:  1.  Exploratory laparotomy and adhesiolysis with left salpingo-oophorectomy.  2.  Removal of mesh repair of her ventral hernia.  3.  Repair of ventral hernia with Marlex mesh.   NOTE:  The patient is a 50 year old female brought to the operating room by  Dr. Antionette Char for pelvic pain and a left-sided adnexal mass.  At a  point of exploration, the patient was noted to have a very large inspissated  mesh repair of the abdominal wall.  Following entry into the abdomen and  performance of a left salpingo-oophorectomy and adhesiolysis,  Dr. Antionette Char requested consultation and surgical assistance with this  patient's large abdominal wall defect and her retained mesh repair.   By the time I entered the operating room, the patient was asleep and had  undergone left salpingo-oophorectomy and adhesiolysis.Marland Kitchen   Upon entering the operative field, exploration of the abdomen is carried out  with no evidence of ongoing infection or bowel perforation.  The left ovary  was surgically absent as was the right ovary.  The uterus was small.  There  was a large area of mesh with adhesions of the omentum to the mesh which  covered the entire lower portion of the abdomen extending from above the  umbilicus down to the pubis.  The adhesions to the mesh were then taken down  completely and the mesh was then excised from the abdominal cavity  and  discarded.  There was a very large abdominal wall defect extending from the  epigastrium down to the pubis which could not be closed primarily.  I then  extended skin flaps laterally to superiorly and inferiorly so as to release  the abdominal wall fascia. The abdomen was then closed with a running suture  of #1 Prolene after traction sutures had been placed at the 6 o'clock, 12  o'clock, 3 o'clock, 9 o'clock, 8 o'clock, 10 o'clock, 2 o'clock, and 4  o'clock positions.  A large onlay patch of Marlex was then placed over the  fascial closure and the traction sutures were then placed with the Marlex  mesh and tied down so as to hold the mesh in place.  I then used an  Endotacker to tack the mesh down to the fascia in two rows.  I then placed  two 19-French Blake drains into the dead space for additional drainage.  Sponge, instrument and sharp counts were doubly verified.  The subcutaneous  tissues were closed with a running suture of 2-0 Vicryl and  the skin was closed with running 4-0  Monocryl suture reinforced with Steri-  Strips.  A sterile dressing was applied.  Anesthetic reversed.  The patient  was removed from the operating room to the recovery room in stable  condition.  She tolerated the procedure well.      Leonie Man, M.D.  Electronically Signed     PB/MEDQ  D:  01/03/2006  T:  01/03/2006  Job:  [id number]   cc:   Roseanna Rainbow, M.D.  Fax: [phone]

## 2010-10-27 NOTE — H&P (Signed)
NAMELOUIE, [name]          ACCOUNT NO.:  [id number]   MEDICAL RECORD NO.:  [id number]          PATIENT TYPE:  AMB   LOCATION:  SDC                           FACILITY:  [facility]   PHYSICIAN:  Roseanna Rainbow, M.D.DATE OF BIRTH:  [birth year]   DATE OF ADMISSION:  DATE OF DISCHARGE:                                HISTORY & PHYSICAL   CHIEF COMPLAINT:  Patient is a 50 year old Caucasian female with a left-  sided hemorrhagic ovarian cyst who presents for an exploratory laparotomy  and left salpingo-oophorectomy.   HISTORY OF PRESENT ILLNESS:  The patient has had a several week history of  left lower quadrant pain.  A CT scan on July 11th was suspicious for an  ovarian mass complicated by torsion; however, a subsequent pelvic ultrasound  at [facility] demonstrated a hemorrhagic ovarian cyst with normal  Doppler flow.  She was admitted for observation on July 12th.  She has been  maintained on moderate potency narcotics.  She has a history of a ruptured  right ovarian cyst and is status post a right salpingo-oophorectomy as well.   ALLERGIES:  ASPIRIN, BENADRYL.   MEDICATIONS:  Effexor, D.H.E., Atacand, Naprosyn, ferrous sulfate, vitamin  C, multivitamins.   PAST MEDICAL HISTORY:  1.  Depression.  2.  Asthma.  3.  Hypertension.  4.  Headaches.   PAST SURGICAL HISTORY:  Appendectomy for a ruptured appendix.  Herniorrhaphy.   SOCIAL HISTORY:  She is married.  Living with her spouse.  She works at  [employer].  She has no significant smoking history.  She does not  give any significant history of alcohol usage and denies illicit drug use.   FAMILY HISTORY:  Cerebrovascular accident, heart disease, hypertension,  myocardial infarction, epilepsy.   PAST GYNECOLOGICAL HISTORY:  Please see the above.  Her menstrual flow is  described as heavy, and she reports severe dysmenorrhea.  Normal Pap smears.   PAST OBSTETRICAL HISTORY:  The patient has never been  pregnant.   PHYSICAL EXAMINATION:  VITAL SIGNS:  Blood pressure 108/52, pulse 93.  Weight 270 pounds.  GENERAL:  An obese Caucasian female, moderate distress.  ABDOMEN:  Soft.  Left lower quadrant tenderness noted.  No rebound or  guarding.  PELVIC:  Limited by the patient's body habitus.  The adnexa are nonpalpable  bilaterally.  LUNGS:  Clear to auscultation.  HEART:  Regular rate and rhythm.  EXTREMITIES:  No clubbing, cyanosis or edema.  SKIN:  Without rash.   ASSESSMENT:  Left ovarian cyst, hemorrhagic.  Differential diagnosis, corpus  luteal cyst, endometrioma.  The patient is symptomatic, requiring moderate  potency narcotics for pain relief.  The planned procedure is an exploratory  laparotomy with left salpingo-oophorectomy.  The risks, benefits and  alternatives forms of management were reviewed with the patient, including  continued expectant management.      Roseanna Rainbow, M.D.  Electronically Signed     LAJ/MEDQ  D:  01/02/2006  T:  01/02/2006  Job:  [id number]

## 2010-10-27 NOTE — Op Note (Signed)
[facility]  Patient:    [name], [name]                 MRN: [id number] Proc. Date: 12/03/00 Adm. Date:  [id number] Attending:  Bonnetta Barry CC:         Melina Copa, M.D., [facility]   Operative Report  PREOPERATIVE DIAGNOSIS:  Ventral incision herniae.  POSTOPERATIVE DIAGNOSIS:  Ventral incisional herniae.  PROCEDURE PERFORMED:  Laparoscopic repair of ventral incisional herniae with Gore-Tex dual mesh.  SURGEON:  Velora Heckler, M.D.  ASSISTANT:  Adolph Pollack, M.D.  ANESTHESIA:  General.  ESTIMATED BLOOD LOSS:  Less than 100 cc.  PREPARATION:  Betadine.  COMPLICATIONS:  None.  INDICATIONS:  The patient is a 50 year old white female who presents with multiple incisional herniae of a lower abdominal midline wound.  These have been present for sometime.  They are slowly increasing in size and causing her increasing discomfort.  She had undergone laparotomy in 9/96 for an ovarian mass.  She had had a subsequent ovarian cyst and appendectomy done in 1997. The patient has not had any signs or symptoms of intestinal instruction.  She now comes to surgery for repair.  DESCRIPTION OF PROCEDURE:  The procedure is done at operating room at the [facility].  The patient is brought to the operating room and placed in the supine position on the operating room table.  Following the administration of general anesthesia, the patient is prepped and draped in the usual strict aseptic fashion.  After ascertaining an adequate level of anesthesia had been obtained, a transverse incision was made in the right lateral abdominal wall.  Dissection was carried down through the subcutaneous tissues.  The fascia was incised.  The muscles were split.  The peritoneum is incised and the peritoneal cavity is entered cautiously.  An 0 Vicryl pursestring suture was placed in the external oblique fashion.  An Hasson cannula was  introduced under direct vision and secured with a pursestring suture.  The abdomen was insufflated with carbon dioxide.  The laparoscope was introduced under direct vision and the abdomen explored.  There were multiple omental adhesions to the anterior abdominal wall and to the hernia defect.  A 5 mm port is placed in the right lower quadrant under direct vision.  Using both sharp and blunt dissection as well as a limited amount of electrocautery for hemostasis, the omental adhesions are taken down off the anterior abdominal wall and the fascial defects are defined.  A second operative port is placed in the left mid abdominal wall located laterally.  The defect is completely defined and the abdominal wall was completely exposed with all the adherent omentum removed.  Good hemostasis was noted.  A small amount of thrombus was evacuated from the right lower quadrant with the aspirating trocar.  Next, spinal needles were inserted through the abdominal wall in order to mark the margins of the hernia defect.  There are several small defects with the largest measuring approximately 4 cm along the line of the old incision.  The entire defect measures 14 x 12 cm in size.  A 20 x 24 cm sheet of Gore-Tex dual mesh is then selected.  A 3.5 cm margin is then created around the spinal needles and drawn on the abdominal wall.  Spinal needles are removed.  Dual mesh is pressed on the abdominal wall in order to imprint the mesh with the dimensions of the patch.  The patch  is sharply cut out of the dual mesh accordingly.  The patch is oriented by placing marks on the mesh and on the abdominal wall at superior, inferior, right and left lateral positions.  0 Prolene sutures are then placed at the four points of the dual mesh and tied securely.  The mesh is then rolled and inserted into the abdominal cavity with the appropriate rough side facing the abdominal wall.  It is unrolled with the orientation marks  lined up accordingly.  Stab wounds were then placed at the four points of the abdominal wall around the defect.  The 0 Prolene sutures are then brought through the abdominal wall with the suture passer and secured with hemostats.  After assuring that the mesh was properly oriented and all sutures were brought completely through the abdominal wall with a fascial bridge at each, the sutures were pulled taught to bring the mesh up to the abdominal wall and then all four were tied securely.  Next, the tacking device was inserted and an external row of tacks were placed circumferentially around the mesh securing it to the abdominal wall.  With the placement satisfactory, a second anterior row of tacks were placed around the Gore-Tex dual mesh further securing it to the anterior abdominal wall.  Good hemostasis was noted throughout.  Ports were removed under direct vision.  A pneumoperitoneum is released and the mesh is allowed to approximate with the abdominal viscera.  0 Vicryl pursestring suture was tied securely.  Good hemostasis is noted.  All four port sites are closed with interrupted 4-0 Vicryl subcuticular sutures. Stab wounds and port sites are washed and dried.  Benzoin and Steri-Strips are applied.  Sterile gauze dressings are applied.  An abdominal binder is placed on the patient.  The patient is awakened from anesthesia and brought to the recovery room in stable condition.  The patient tolerated the procedure well. DD:  12/03/00 TD:  12/03/00 Job: [id number]

## 2010-10-27 NOTE — Op Note (Signed)
[name], [name]          ACCOUNT NO.:  [id number]   MEDICAL RECORD NO.:  [id number]          PATIENT TYPE:  INP   LOCATION:  9320                          FACILITY:  [facility]   PHYSICIAN:  Roseanna Rainbow, M.D.DATE OF BIRTH:  [birth year]   DATE OF PROCEDURE:  01/03/2006  DATE OF DISCHARGE:                                 OPERATIVE REPORT   PREOPERATIVE DIAGNOSIS:  Left sided ovarian cyst, hemorrhagic, with  secondary pelvic pain.   POSTOPERATIVE DIAGNOSIS:  Left sided ovarian cyst, hemorrhagic, with  secondary pelvic pain, ventral hernia, adhesions.   PROCEDURES:  Exploratory laparotomy, lysis of adhesions, left salpingo-  oophorectomy, and excision of previous mesh, ventral herniorrhaphy with mesh  placement.   SURGEON:  Roseanna Rainbow, M.D.  Charles A. Clearance Coots, M.D.  Leonie Man, M.D.   ANESTHESIA:  General endotracheal anesthesia.   COMPLICATIONS:  None.   ESTIMATED BLOOD LOSS:  300 mL.   FLUIDS AND URINE OUTPUT:  As per anesthesiology.   FINDINGS:  There was a mesh noted upon attempts to enter the peritoneal  cavity.  There was a pocket of fluid underneath the mesh that had old  purulent drainage. This cavity was cultured.  There were adhesions noted  involving the omentum to the mesh. There were also adhesions involving the  sigmoid colon to the fundus of the uterus and the left ovary.   PROCEDURE:  The risks, benefits, indications, and alternatives of the  procedure were reviewed with the patient and informed consent had been  obtained.  She was taken to the operating room with an IV running.  The  patient was placed in the dorsal lithotomy position, given general  anesthesia, and prepped and draped in the usual sterile fashion.  A midline  incision was then made and extended to the rectus fascia.  The fascia was  incised along the length the incision.  Again, upon attempt to enter the  peritoneal cavity, the mesh was incised and the cavity  underneath the mesh  was noted to have the material described above.  This was cultured.  The  peritoneal cavity was then entered above the mesh.  The peritoneal incision  was extended laterally to the mesh.  The adhesions described above were then  divided.  The infundibular pelvic ligament was then clamped. Secondary to  the patient's body habitus, exposure was limited. However, the ureter was  palpated.  Indigo carmine was given intravenously prior to transection of  infundibular pelvic ligament. No indigo carmine was noted spilling into the  pelvis.  The ovary and tube was then removed.  The utero-ovarian ligament  and fallopian tube were clamped and transected.  The pedicle was secured  with a suture ligature.  Small bleeding points in the mesentery of the  sigmoid colon were cauterized and secured with suture ligatures.  Adequate  hemostasis was noted.  At this point during the case a surgery consult was  obtained.  Please see the dictated summary as per Dr. Leonie Man for the remainder  of the case.  The sponge, lap, and needle counts were correct x2 at the end  of  the procedure.  The patient was taken to the PACU awake and in stable  condition.      Roseanna Rainbow, M.D.  Electronically Signed     LAJ/MEDQ  D:  01/03/2006  T:  01/03/2006  Job:  [id number]

## 2010-10-27 NOTE — Op Note (Signed)
[name], [name]          ACCOUNT NO.:  [id number]   MEDICAL RECORD NO.:  [id number]          PATIENT TYPE:  OIB   LOCATION:  9315                          FACILITY:  [facility]   PHYSICIAN:  Zenaida Niece, M.D.DATE OF BIRTH:  Aug 24, 1960   DATE OF PROCEDURE:  DATE OF DISCHARGE:  02/12/2007                               OPERATIVE REPORT   PREOPERATIVE DIAGNOSIS:  Pelvic pain and abnormal bleeding.   POSTOPERATIVE DIAGNOSIS:  Pelvic pain, abnormal bleeding and significant  abdominal and pelvic adhesions.   PROCEDURE:  Laparoscopy with significant lysis of adhesions and  laparoscopic supracervical hysterectomy.   SURGEON:  Zenaida Niece, M.D.   ASSISTANT:  Sherron Monday, M.D.   ANESTHESIA:  General endotracheal tube.   SPECIMENS:  Uterus, morcellated, sent to Pathology.   ESTIMATED BLOOD LOSS:  Minimal.   COMPLICATIONS:  None.   FINDINGS:  She had significant omental and bowel adhesions to the  anterior abdominal wall and filmy adhesions of the posterior uterus and  the left side of the uterus.  There was residual right fallopian tube,  but I was not able to identify adnexal structures on the left side.   PROCEDURE IN DETAIL:  The patient was taken to the operating room and  placed in the dorsal supine position.  General anesthesia was induced.  She was then placed in mobile stirrups.  The abdomen, perineum and  vagina were prepped and draped in the usual sterile fashion and a Foley  catheter was inserted.  The skin just superior to her umbilicus was  infiltrated with 0.25% Marcaine due to her umbilicus being low on her  abdomen.  A 3-cm horizontal incision was made just superior to her  umbilicus.  This incision was then carried down to the fascia which was  identified and grasped with Kelly clamps and elevated.  This was then  incised sharply with scissors which cut through both fascia and Prolene  mesh which was incorporated into the fascia.  Once the peritoneal  cavity  was entered, there were significant adhesions around this.  These were  taken down bluntly, sweeping with a finger.  Two stay sutures of 0  Novofil were placed at the corners and a Hassan cannula was inserted and  secured.  CO2 gas was insufflated and the laparoscope was inserted.  Insignificant adhesions were noted to most of the anterior abdominal  wall that initially prohibited proceeding with the hysterectomy.  Over  approximately an hour, I used an the operating scope to take down  adhesions of omentum and bowel to the anterior abdominal wall, mostly on  the right side.  Occasionally, using bipolar cautery for vascular  pedicles and the omentum, these were taken with bipolar cautery and  sharp dissection.  This freed up the right side of the abdomen so that  we could see the abdominal wall.  On the left side there were  significant adhesions going from just to the left of the umbilicus all  the way down to the pelvis.  There were also significant adhesions  superior to this.  Most of these were taken down in the same  manner as  on the right side.  However, I was not able to get good enough  visualization yet to insert a trocar on the left side.  A 5-mm trocar  was then introduced on the right side under direct visualization.  Using  the scope and scissors from the 5-mm port, we were able to take down  further adhesions on the left side to enable me to visualize the left  side adequately to insert a trocar.  An incision was made and a 12-mm  trocar was introduced under direct visualization.  This did go through a  portion of omentum but no bowel was identified in this area.  Again,  this adhesiolysis took probably about an hour.  We were then finally  able to identify the uterus.  It was grasped with a single-tooth  tenaculum and elevated.  Filmy adhesions of the posterior uterus were  taken down with the Harmonic Scalpel Ace.  Again, the right fallopian  tube was identified.   This was elevated and, using the Harmonic Scalpel  Ace, it was freed from surrounding tissues.  The uterus was then grasped  again and the round ligament and broad ligaments were taken down with  the Harmonic Scalpel Ace.  There was an adhesion anteriorly of the  uterus to the abdominal wall and this was taken down again with the  Harmonic Scalpel Ace.  Anterior peritoneum was incised across the  anterior portion uterus to push the bladder inferior.  The uterine  artery was not taken yet at this time.  The Harmonic Scalpel was then  placed on the left side and the uterus grasped from the right side.  It  was elevated, and adhesions were pushed away from the uterus on the left  side.  I was eventually able to identify the left uterine cornu and take  this down with the Harmonic Scalpel Ace going through whatever utero-  ovarian pedicle was left followed by round ligament and upper broad  ligament.  There were some more adhesions posteriorly, and once these  were taken down with Harmonic Scalpel, the uterus had much better  visualization.  Anterior peritoneum was incised across the anterior  portion of the lower uterine segment to push the bladder inferior.  I  was able to identify and take down the uterine arteries with the  Harmonic Scalpel with good hemostasis.  Attention was then turned back  to the right side where, with the Harmonic Scalpel, the uterine artery  was identified and taken down with the Harmonic Scalpel.  Then using a  drill, clamp, cut technique on maximum power, the Harmonic Scalpel was  used to amputate the uterus from the cervix.  Most of this was done from  the right side and then a small amount finished on the left side to free  the uterine fundus from the cervix.  The cervical stump appeared to be  hemostatic.  The 12-mm port on the left side was removed and the  morcellator was inserted, again with adequate visualization.  The uterus  was grasped with a tenaculum  through the morcellator and was removed in  2 long strips through the morcellator.  Several small pieces were then  removed with a laparoscopic spoon and no further pieces were identified.  The pelvis was copiously irrigated, and a small amount of bleeding at  the left angle of the cervix was controlled with the Harmonic Scalpel.  All other pedicles were hemostatic.  No injury to  the bowel was  identified.  A piece of Interceed was then placed over the cervical  stump.  All laparoscopic instruments were then removed under direct  visualization.  The Hassan cannula was removed and gas allowed to  deflate from the abdomen.  The fascial defect with the mesh was closed  with interrupted figure-of-8 sutures of 0 Novofil.  On the left side at  the 12-mm incision, the fascia was closed with 1 figure-of-8 suture of 0  Vicryl.  Skin incisions were then closed with interrupted subcuticular  sutures of 4-0 Vicryl followed by Dermabond.  The Foley catheter was  removed.  The patient tolerated the procedure well.  She was extubated  in the operating room and taken to the recovery room in stable  condition.  Counts were correct x2, she received Ancef 1 gram IV prior  to the procedure, she had PAS hose on throughout the procedure.      Zenaida Niece, M.D.  Electronically Signed     TDM/MEDQ  D:  02/13/2007  T:  02/13/2007  Job:  [id number]

## 2010-10-27 NOTE — Discharge Summary (Signed)
[name], [name]          ACCOUNT NO.:  [id number]   MEDICAL RECORD NO.:  [id number]          PATIENT TYPE:  INP   LOCATION:  9320                          FACILITY:  [facility]   PHYSICIAN:  Charles A. Clearance Coots, M.D.DATE OF BIRTH:  12-25-1960   DATE OF ADMISSION:  01/03/2006  DATE OF DISCHARGE:  01/08/2006                                 DISCHARGE SUMMARY   ADMITTING DIAGNOSIS:  Left ovarian hemorrhagic ovarian cyst, symptomatic.   DISCHARGE DIAGNOSIS:  Left ovarian hemorrhagic ovarian cyst, symptomatic  status post exploratory laparotomy and adhesiolysis with left salpingo-  oophorectomy, removal of mesh repair of ventral hernia, repair of internal  hernia with Marlex mesh.  Discharged home in good condition.   REASON FOR ADMISSION:  A 50 year old Caucasian female with left-sided  hemorrhagic ovarian cyst who presents for exploratory laparotomy and left  salpingo-oophorectomy.  The patient had a history of several weeks of left  lower quadrant pain.  CT scan on July 11 was suspicious for an ovarian mass  complicated by torsion.  However, a subsequent pelvic ultrasound at [facility] demonstrated a hemorrhagic ovarian cyst with normal Doppler flow.  She was admitted for observation on July 12.  She has been maintained on  moderate-potency narcotics, has a history of ruptured right ovarian cyst and  is status post a right salpingo-oophorectomy as well.   PAST MEDICAL HISTORY:  Surgery:  Appendectomy for ruptured appendix,  herniorrhaphy.  Illnesses:  Depression, asthma, hypertension, headache.   MEDICATION:  Effexor, DHE, Atacand, Naprosyn, ferrous sulfate, vitamin C,  multivitamin.   ALLERGIES:  ASPIRIN and BENADRYL.   FAMILY HISTORY:  Cerebrovascular accident, heart disease, hypertension,  myocardial infarction, epilepsy.   GYNECOLOGICAL HISTORY:  Menstrual flow is described as heavy.  Reports  severe dysmenorrhea.  Pap smears have been within normal limits   PAST  OBSTETRICAL HISTORY:  She is nulligravid.   PHYSICAL EXAMINATION:  GENERAL:  Obese Caucasian female in moderate  distress.  VITAL SIGNS:  Blood pressure 108/52, pulse 93, weight 270 pounds.  ABDOMEN:  Soft, left lower quadrant tenderness noted.  No rebound or  guarding.  LUNGS:  Clear to auscultation bilaterally.  HEART:  Regular rate and rhythm.  PELVIC:  Exam limited by the patient's body habitus.  Adnexa are nonpalpable  bilaterally.  EXTREMITIES:  Without clubbing, cyanosis or edema bilaterally.   ADMITTING LABORATORY VALUES:  Hemoglobin 10.7; hematocrit 32.2; white blood  cell count 5700; platelets 222,000.  Comprehensive metabolic panel was  within normal limits except for elevated glucose 182.  Hemoglobin A1c was 8.  Urine pregnancy test negative.   HOSPITAL COURSE:  The patient underwent exploratory laparotomy and  adhesiolysis with left salpingo-oophorectomy and removal of mesh repair of  her ventral abdomen with a repair of ventral hernia with Marlex mesh.  There  were no intraoperative complications.  Postoperative course was  uncomplicated.  The patient was discharged home on postoperative day #5 in  good condition.   DISCHARGE LABORATORY VALUES:  Hemoglobin 10.7; hematocrit 32.2; white blood  cell count 5700; platelets 222,000   DISCHARGE DISPOSITION:  Medications:  Vitamins and ibuprofen were prescribed  for pain along with Percocet.  Written instructions were given for  postoperative care after hysterectomy and ventral hernia repair.  The  patient is to call office for followup appointment in 2 weeks.      Charles A. Clearance Coots, M.D.  Electronically Signed     CAH/MEDQ  D:  02/08/2006  T:  02/09/2006  Job:  [id number]

## 2010-10-30 ENCOUNTER — Telehealth: Payer: Self-pay | Admitting: *Deleted

## 2010-10-30 ENCOUNTER — Encounter: Payer: Self-pay | Admitting: *Deleted

## 2010-10-30 DIAGNOSIS — E785 Hyperlipidemia, unspecified: Secondary | ICD-10-CM

## 2010-10-30 MED ORDER — ATORVASTATIN CALCIUM 80 MG PO TABS: 80.0000 mg | ORAL_TABLET | Freq: Every day | ORAL | Status: DC

## 2010-10-30 MED ORDER — COLESEVELAM HCL 625 MG PO TABS: 1250.0000 mg | ORAL_TABLET | Freq: Three times a day (TID) | ORAL | Status: DC

## 2010-10-30 NOTE — Telephone Encounter (Signed)
I spoke with pt on the telephone, gave labwork results, pt verbalized understanding, also instrucitons for change in meds to increase lipitor to 80mg  daily and welchol 625mg  2 tablets 3x daily , repeat lipids in 1 month

## 2010-10-30 NOTE — Telephone Encounter (Signed)
Message copied by Teressa Lower on Mon Oct 30, 2010  3:47 PM ------      Message from: [place]      Created: Sun Oct 29, 2010 10:03 PM       Cholesterol improved but requires additional lipid lowering therapy.      Increase atorvastatin to 80 mg q.d.      WelChol 2 tablets t.i.d with meals      Fasting lipid profile in one month.

## 2011-02-21 ENCOUNTER — Other Ambulatory Visit: Payer: Self-pay | Admitting: Family Medicine

## 2011-02-21 DIAGNOSIS — H539 Unspecified visual disturbance: Secondary | ICD-10-CM

## 2011-02-26 ENCOUNTER — Ambulatory Visit
Admission: RE | Admit: 2011-02-26 | Discharge: 2011-02-26 | Disposition: A | Discharge status: Home / Self Care | Payer: 59 | Source: Ambulatory Visit | Attending: Family Medicine | Admitting: Family Medicine

## 2011-02-26 DIAGNOSIS — H539 Unspecified visual disturbance: Secondary | ICD-10-CM

## 2011-02-26 MED ORDER — GADOBENATE DIMEGLUMINE 529 MG/ML IV SOLN
20.0000 mL | Freq: Once | INTRAVENOUS | Status: AC | PRN
  Administered 2011-02-26: 20 mL via INTRAVENOUS

## 2011-03-23 LAB — CBC
HCT: 39
MCHC: 33.3
MCV: 80.3
RBC: 4.86
WBC: 7.8

## 2011-03-23 LAB — BASIC METABOLIC PANEL
BUN: 10
CO2: 28
Chloride: 106
Creatinine, Ser: 0.6
GFR calc Af Amer: >60
Potassium: 4.3

## 2011-07-23 ENCOUNTER — Other Ambulatory Visit: Payer: Self-pay | Admitting: *Deleted

## 2011-07-23 DIAGNOSIS — E785 Hyperlipidemia, unspecified: Secondary | ICD-10-CM

## 2011-07-23 MED ORDER — ATORVASTATIN CALCIUM 80 MG PO TABS: 80.0000 mg | ORAL_TABLET | Freq: Every day | ORAL | Status: DC

## 2012-08-29 ENCOUNTER — Other Ambulatory Visit: Payer: PRIVATE HEALTH INSURANCE

## 2012-09-03 ENCOUNTER — Telehealth: Payer: Self-pay | Admitting: Nurse Practitioner

## 2012-09-03 NOTE — Telephone Encounter (Signed)
appt made

## 2012-09-03 NOTE — Telephone Encounter (Signed)
Needs refills on diabetic med and blood pressure and wellbutrin. NTBS

## 2012-09-05 ENCOUNTER — Other Ambulatory Visit: Payer: Self-pay | Admitting: Nurse Practitioner

## 2012-09-05 ENCOUNTER — Telehealth: Payer: Self-pay | Admitting: *Deleted

## 2012-09-05 MED ORDER — TOPIRAMATE 50 MG PO TABS: 50.0000 mg | ORAL_TABLET | Freq: Two times a day (BID) | ORAL | Status: DC

## 2012-09-05 MED ORDER — ERGOCALCIFEROL 1.25 MG (50000 UT) PO CAPS: 50000.0000 [IU] | ORAL_CAPSULE | ORAL | Status: DC

## 2012-09-05 MED ORDER — BUPROPION HCL ER (XL) 150 MG PO TB24: 150.0000 mg | ORAL_TABLET | Freq: Every day | ORAL | Status: DC

## 2012-09-05 NOTE — Telephone Encounter (Signed)
I am sending back refill requests for 3 meds on this patient. Please review note in chart from 08/29/12.

## 2012-09-24 ENCOUNTER — Encounter: Payer: Self-pay | Admitting: Nurse Practitioner

## 2012-09-24 ENCOUNTER — Ambulatory Visit (INDEPENDENT_AMBULATORY_CARE_PROVIDER_SITE_OTHER): Payer: PRIVATE HEALTH INSURANCE | Admitting: Nurse Practitioner

## 2012-09-24 VITALS — BP 138/76 | HR 59 | Temp 97.2°F | Ht 63.5 in | Wt 257.5 lb

## 2012-09-24 DIAGNOSIS — Z91038 Other insect allergy status: Secondary | ICD-10-CM

## 2012-09-24 DIAGNOSIS — I10 Essential (primary) hypertension: Secondary | ICD-10-CM

## 2012-09-24 DIAGNOSIS — F331 Major depressive disorder, recurrent, moderate: Secondary | ICD-10-CM

## 2012-09-24 DIAGNOSIS — Z91030 Bee allergy status: Secondary | ICD-10-CM

## 2012-09-24 DIAGNOSIS — E119 Type 2 diabetes mellitus without complications: Secondary | ICD-10-CM

## 2012-09-24 DIAGNOSIS — E039 Hypothyroidism, unspecified: Secondary | ICD-10-CM

## 2012-09-24 DIAGNOSIS — E786 Lipoprotein deficiency: Secondary | ICD-10-CM

## 2012-09-24 DIAGNOSIS — G43901 Migraine, unspecified, not intractable, with status migrainosus: Secondary | ICD-10-CM

## 2012-09-24 LAB — COMPLETE METABOLIC PANEL WITH GFR
ALT: 35 U/L (ref 0–35)
BUN: 11 mg/dL (ref 6–23)
CO2: 22 mEq/L (ref 19–32)
Calcium: 9.8 mg/dL (ref 8.4–10.5)
Chloride: 101 mEq/L (ref 96–112)
Creat: 0.77 mg/dL (ref 0.50–1.10)
GFR, Est African American: >89 mL/min
GFR, Est Non African American: >89 mL/min
Glucose, Bld: 284 mg/dL — ABNORMAL HIGH (ref 70–99)
Total Bilirubin: 0.4 mg/dL (ref 0.3–1.2)

## 2012-09-24 LAB — THYROID PANEL WITH TSH
T4, Total: 7.9 ug/dL (ref 5.0–12.5)
TSH: 2.731 u[IU]/mL (ref 0.350–4.500)

## 2012-09-24 LAB — POCT GLYCOSYLATED HEMOGLOBIN (HGB A1C): Hemoglobin A1C: 10.2

## 2012-09-24 MED ORDER — LEVOTHYROXINE SODIUM 75 MCG PO TABS: 75.0000 ug | ORAL_TABLET | Freq: Every day | ORAL | Status: DC

## 2012-09-24 MED ORDER — PROPRANOLOL HCL 40 MG PO TABS: 40.0000 mg | ORAL_TABLET | Freq: Two times a day (BID) | ORAL | Status: DC

## 2012-09-24 MED ORDER — EPINEPHRINE 0.3 MG/0.3ML IJ DEVI: 0.3000 mg | Freq: Once | INTRAMUSCULAR | Status: DC

## 2012-09-24 MED ORDER — BENAZEPRIL HCL 20 MG PO TABS: 20.0000 mg | ORAL_TABLET | Freq: Every day | ORAL | Status: DC

## 2012-09-24 MED ORDER — BUPROPION HCL ER (XL) 150 MG PO TB24: 150.0000 mg | ORAL_TABLET | Freq: Every day | ORAL | Status: DC

## 2012-09-24 MED ORDER — SAXAGLIPTIN-METFORMIN ER 5-500 MG PO TB24: 1.0000 | ORAL_TABLET | Freq: Every day | ORAL | Status: DC

## 2012-09-24 MED ORDER — TOPIRAMATE 50 MG PO TABS: 50.0000 mg | ORAL_TABLET | Freq: Two times a day (BID) | ORAL | Status: DC

## 2012-09-24 NOTE — Patient Instructions (Signed)
Diabetes and Exercise  Regular exercise is important and can help:   · Control blood glucose (sugar).  · Decrease blood pressure.  ·   · Control blood lipids (cholesterol, triglycerides).  · Improve overall health.  BENEFITS FROM EXERCISE  · Improved fitness.  · Improved flexibility.  · Improved endurance.  · Increased bone density.  · Weight control.  · Increased muscle strength.  · Decreased body fat.  · Improvement of the body's use of insulin, a hormone.  · Increased insulin sensitivity.  · Reduction of insulin needs.  · Reduced stress and tension.  · Helps you feel better.  People with diabetes who add exercise to their lifestyle gain additional benefits, including:  · Weight loss.  · Reduced appetite.  · Improvement of the body's use of blood glucose.  · Decreased risk factors for heart disease:  · Lowering of cholesterol and triglycerides.  · Raising the level of good cholesterol (high-density lipoproteins, HDL).  · Lowering blood sugar.  · Decreased blood pressure.  TYPE 1 DIABETES AND EXERCISE  · Exercise will usually lower your blood glucose.  · If blood glucose is greater than 240 mg/dl, check urine ketones. If ketones are present, do not exercise.  · Location of the insulin injection sites may need to be adjusted with exercise. Avoid injecting insulin into areas of the body that will be exercised. For example, avoid injecting insulin into:  · The arms when playing tennis.  · The legs when jogging. For more information, discuss this with your caregiver.  · Keep a record of:  · Food intake.  · Type and amount of exercise.  · Expected peak times of insulin action.  · Blood glucose levels.  Do this before, during, and after exercise. Review your records with your caregiver. This will help you to develop guidelines for adjusting food intake and insulin amounts.   TYPE 2 DIABETES AND EXERCISE  · Regular physical activity can help control blood glucose.  · Exercise is important because it may:  · Increase the  body's sensitivity to insulin.  · Improve blood glucose control.  · Exercise reduces the risk of heart disease. It decreases serum cholesterol and triglycerides. It also lowers blood pressure.  · Those who take insulin or oral hypoglycemic agents should watch for signs of hypoglycemia. These signs include dizziness, shaking, sweating, chills, and confusion.  · Body water is lost during exercise. It must be replaced. This will help to avoid loss of body fluids (dehydration) or heat stroke.  Be sure to talk to your caregiver before starting an exercise program to make sure it is safe for you. Remember, any activity is better than none.   Document Released: 08/18/2003 Document Revised: 08/20/2011 Document Reviewed: 12/02/2008  ExitCare® Patient Information ©2013 [employer].

## 2012-09-24 NOTE — Progress Notes (Signed)
Subjective:    Patient ID: [name], female    DOB: 1961/03/15, 52 y.o.   MRN: [id number]  Hyperlipidemia This is a chronic problem. The current episode started more than 1 year ago. The problem is uncontrolled. Exacerbating diseases include diabetes and hypothyroidism. Associated symptoms include myalgias. Pertinent negatives include no chest pain or shortness of breath. She is currently on no antihyperlipidemic treatment. The current treatment provides no improvement of lipids. Compliance problems include medication side effects.  Risk factors for coronary artery disease include post-menopausal, obesity, dyslipidemia and family history.  Migraine  This is a chronic problem. The current episode started more than 1 year ago. The problem occurs monthly. The problem has been gradually improving. The pain is located in the left unilateral and occipital region. The pain does not radiate. The pain quality is similar to prior headaches. The quality of the pain is described as stabbing. The pain is at a severity of 10/10. The pain is moderate. Pertinent negatives include no blurred vision, dizziness, numbness or seizures. The treatment provided moderate relief. There is no history of cancer or hypertension.  Hypertension This is a chronic problem. The current episode started more than 1 year ago. The problem has been resolved since onset. The problem is controlled. Associated symptoms include anxiety. Pertinent negatives include no blurred vision, chest pain, palpitations, peripheral edema or shortness of breath. There are no associated agents to hypertension. Risk factors for coronary artery disease include diabetes mellitus, dyslipidemia, obesity, post-menopausal state, family history and stress. Past treatments include ACE inhibitors and beta blockers. The current treatment provides moderate improvement. Compliance problems include diet.  There is no history of heart failure.  Diabetes She  presents for her follow-up diabetic visit. She has type 2 diabetes mellitus. No MedicAlert identification noted. Her disease course has been stable. Pertinent negatives for hypoglycemia include no dizziness or seizures. Associated symptoms include fatigue. Pertinent negatives for diabetes include no blurred vision, no chest pain and no foot ulcerations. Pertinent negatives for hypoglycemia complications include no blackouts and no hospitalization. Symptoms are stable. Diabetic complications include peripheral neuropathy (both hands). (Pt states the numbness in hands is from "carpal tunnel") Risk factors for coronary artery disease include hypertension, obesity, dyslipidemia, diabetes mellitus and post-menopausal. Current diabetic treatment includes diet. She is compliant with treatment none of the time. When asked about meal planning, she reported none. She participates in exercise weekly. Her breakfast blood glucose is taken between 9-10 am. Her breakfast blood glucose range is generally 130-140 mg/dl. She does not see a podiatrist.Eye exam is current (April 2013).      Review of Systems  Constitutional: Positive for fatigue.  Eyes: Negative for blurred vision.  Respiratory: Negative for shortness of breath.   Cardiovascular: Negative for chest pain and palpitations.  Musculoskeletal: Positive for myalgias.  Neurological: Negative for dizziness, seizures and numbness.  All other systems reviewed and are negative.       Objective:   Physical Exam  Constitutional: She is oriented to person, place, and time. She appears well-developed and well-nourished.  HENT:  Nose: Nose normal.  Mouth/Throat: Oropharynx is clear and moist.  Eyes: EOM are normal.  Neck: Trachea normal, normal range of motion and full passive range of motion without pain. Neck supple. No JVD present. Carotid bruit is not present. No thyromegaly present.  Cardiovascular: Normal rate, regular rhythm, normal heart sounds and  intact distal pulses.  Exam reveals no gallop and no friction rub.   No murmur heard. Pulmonary/Chest:  Effort normal and breath sounds normal.  Abdominal: Soft. Bowel sounds are normal. She exhibits no distension and no mass. There is no tenderness.  Musculoskeletal: Normal range of motion.  Lymphadenopathy:    She has no cervical adenopathy.  Neurological: She is alert and oriented to person, place, and time. She has normal reflexes.  +4/4 monofilament bilaterally   Skin: Skin is warm and dry.  Psychiatric: She has a normal mood and affect. Her behavior is normal. Judgment and thought content normal.    BP 138/76  Pulse 59  Temp(Src) 97.2 F (36.2 C) (Oral)  Ht 5' 3.5" (1.613 m)  Wt 257 lb 8 oz (116.801 kg)  BMI 44.89 kg/m2   Results for orders placed in visit on 09/24/12  POCT GLYCOSYLATED HEMOGLOBIN (HGB A1C)      Result Value Range   Hemoglobin A1C 10.2    GLUCOSE, POCT (MANUAL RESULT ENTRY)      Result Value Range   POC Glucose 257 (*) 70 - 99 mg/dl       Assessment & Plan:  1. Essential hypertension, benign -Encourage excerise - COMPLETE METABOLIC PANEL WITH GFR - benazepril (LOTENSIN) 20 MG tablet; Take 1 tablet (20 mg total) by mouth daily.  Dispense: 30 tablet; Refill: 3  2. Diabetes mellitus with HbA1C goal between 7 and 8 -Check blood sugars daily -Keep a daily diary of sugars -Low Carb diet -Encourage Excerise - POCT glycosylated hemoglobin (Hb A1C) - POCT glucose (manual entry) - Saxagliptin-Metformin (KOMBIGLYZE XR) 5-500 MG TB24; Take 1 tablet by mouth daily.  Dispense: 35 tablet; Refill: 0  3. Hypolipidemia -Encourage low fat diet - NMR Lipoprofile with Lipids  4. Unspecified hypothyroidism - Thyroid Panel With TSH - levothyroxine (LEVOTHROID) 75 MCG tablet; Take 1 tablet (75 mcg total) by mouth daily.  Dispense: 30 tablet; Refill: 3  5. Migraine with status migrainosus - topiramate (TOPAMAX) 50 MG tablet; Take 1 tablet (50 mg total) by mouth 2  (two) times daily.  Dispense: 60 tablet; Refill: 3 - propranolol (INDERAL) 40 MG tablet; Take 1 tablet (40 mg total) by mouth 2 (two) times daily.  Dispense: 30 tablet; Refill: 3  6. Major depressive disorder, recurrent episode, moderate -Stress management - buPROPion (WELLBUTRIN XL) 150 MG 24 hr tablet; Take 1 tablet (150 mg total) by mouth daily.  Dispense: 30 tablet; Refill: 3  7. Bee allergy status - EPINEPHrine (EPI-PEN) 0.3 mg/0.3 mL DEVI; Inject 0.3 mLs (0.3 mg total) into the muscle once.  Dispense: 1 Device; Refill: 3  Health Maintenance Discussed and Hemocult cards given  Mary-Margaret Daphine Deutscher, FNP

## 2012-09-25 LAB — NMR LIPOPROFILE WITH LIPIDS
HDL Particle Number: 30.5 umol/L (ref >=30.5)
HDL Size: 9.4 nm (ref >=9.2)
HDL-C: 40 mg/dL (ref >=40)
LDL (calc): 189 mg/dL — ABNORMAL HIGH (ref <100)
LDL Particle Number: >3500 nmol/L — ABNORMAL HIGH (ref <1000)
LDL Size: 19.5 nm — ABNORMAL LOW (ref >20.5)
LP-IR Score: 70 — ABNORMAL HIGH (ref <=45)
Small LDL Particle Number: >2300 nmol/L — ABNORMAL HIGH (ref <=527)
VLDL Size: 53.7 nm — ABNORMAL HIGH (ref <=46.6)

## 2012-10-23 NOTE — Telephone Encounter (Signed)
Pt called per Billee Cashing LPN appt made

## 2012-10-23 NOTE — Telephone Encounter (Deleted)
Pt called her [name] and appt made

## 2012-10-24 ENCOUNTER — Ambulatory Visit (INDEPENDENT_AMBULATORY_CARE_PROVIDER_SITE_OTHER): Payer: PRIVATE HEALTH INSURANCE | Admitting: Nurse Practitioner

## 2012-10-24 ENCOUNTER — Encounter: Payer: Self-pay | Admitting: Nurse Practitioner

## 2012-10-24 VITALS — BP 126/67 | HR 56 | Temp 97.0°F | Ht 64.0 in | Wt 251.0 lb

## 2012-10-24 DIAGNOSIS — E119 Type 2 diabetes mellitus without complications: Secondary | ICD-10-CM

## 2012-10-24 DIAGNOSIS — I10 Essential (primary) hypertension: Secondary | ICD-10-CM

## 2012-10-24 LAB — POCT GLYCOSYLATED HEMOGLOBIN (HGB A1C): Hemoglobin A1C: 9.1

## 2012-10-24 NOTE — Patient Instructions (Signed)
Diets for Diabetes, Food Labeling Look at food labels to help you decide how much of a product you can eat. You will want to check the amount of total carbohydrate in a serving to see how the food fits into your meal plan. In the list of ingredients, the ingredient present in the largest amount by weight must be listed first, followed by the other ingredients in descending order. STANDARD OF IDENTITY Most products have a list of ingredients. However, foods that the Food and Drug Administration (FDA) has given a standard of identity do not need a list of ingredients. A standard of identity means that a food must contain certain ingredients if it is called a particular name. Examples are mayonnaise, peanut butter, ketchup, jelly, and cheese. LABELING TERMS There are many terms found on food labels. Some of these terms have specific definitions. Some terms are regulated by the FDA, and the FDA has clearly specified how they can be used. Others are not regulated or well-defined and can be misleading and confusing. SPECIFICALLY DEFINED TERMS Nutritive Sweetener.  A sweetener that contains calories,such as table sugar or honey. Nonnutritive Sweetener.  A sweetener with few or no calories,such as saccharin, aspartame, sucralose, and cyclamate. LABELING TERMS REGULATED BY THE FDA Free.  The product contains only a tiny or small amount of fat, cholesterol, sodium, sugar, or calories. For example, a "fat-free" product will contain less than 0.5 g of fat per serving. Low.  A food described as "low" in fat, saturated fat, cholesterol, sodium, or calories could be eaten fairly often without exceeding dietary guidelines. For example, "low in fat" means no more than 3 g of fat per serving. Lean.  "Lean" and "extra lean" are U.S. Department of Agriculture (USDA) terms for use on meat and poultry products. "Lean" means the product contains less than 10 g of fat, 4 g of saturated fat, and 95 mg of cholesterol  per serving. "Lean" is not as low in fat as a product labeled "low." Extra Lean.  "Extra lean" means the product contains less than 5 g of fat, 2 g of saturated fat, and 95 mg of cholesterol per serving. While "extra lean" has less fat than "lean," it is still higher in fat than a product labeled "low." Reduced, Less, Fewer.  A diet product that contains 25% less of a nutrient or calories than the regular version. For example, hot dogs might be labeled "25% less fat than our regular hot dogs." Light/Lite.  A diet product that contains  fewer calories or  the fat of the original. For example, "light in sodium" means a product with  the usual sodium. More.  One serving contains at least 10% more of the daily value of a vitamin, mineral, or fiber than usual. Good Source Of.  One serving contains 10% to 19% of the daily value for a particular vitamin, mineral, or fiber. Excellent Source Of.  One serving contains 20% or more of the daily value for a particular nutrient. Other terms used might be "high in" or "rich in." Enriched or Fortified.  The product contains added vitamins, minerals, or protein. Nutrition labeling must be used on enriched or fortified foods. Imitation.  The product has been altered so that it is lower in protein, vitamins, or minerals than the usual food,such as imitation peanut butter. Total Fat.  The number listed is the total of all fat found in a serving of the product. Under total fat, food labels must list saturated fat and   trans fat, which are associated with raising bad cholesterol and an increased risk of heart blood vessel disease. Saturated Fat.  Mainly fats from animal-based sources. Some examples are red meat, cheese, cream, whole milk, and coconut oil. Trans Fat.  Found in some fried snack foods, packaged foods, and fried restaurant foods. It is recommended you eat as close to 0 g of trans fat as possible, since it raises bad cholesterol and lowers  good cholesterol. Polyunsaturated and Monounsaturated Fats.  More healthful fats. These fats are from plant sources. Total Carbohydrate.  The number of carbohydrate grams in a serving of the product. Under total carbohydrate are listed the other carbohydrate sources, such as dietary fiber and sugars. Dietary Fiber.  A carbohydrate from plant sources. Sugars.  Sugars listed on the label contain all naturally occurring sugars as well as added sugars. LABELING TERMS NOT REGULATED BY THE FDA Sugarless.  Table sugar (sucrose) has not been added. However, the manufacturer may use another form of sugar in place of sucrose to sweeten the product. For example, sugar alcohols are used to sweeten foods. Sugar alcohols are a form of sugar but are not table sugar. If a product contains sugar alcohols in place of sucrose, it can still be labeled "sugarless." Low Salt, Salt-Free, Unsalted, No Salt, No Salt Added, Without Added Salt.  Food that is usually processed with salt has been made without salt. However, the food may contain sodium-containing additives, such as preservatives, leavening agents, or flavorings. Natural.  This term has no legal meaning. Organic.  Foods that are certified as organic have been inspected and approved by the USDA to ensure they are produced without pesticides, fertilizers containing synthetic ingredients, bioengineering, or ionizing radiation. Document Released: 05/31/2003 Document Revised: 08/20/2011 Document Reviewed: 12/16/2008 Exit[employer] Patient Information 2013 [employer].  

## 2012-10-24 NOTE — Progress Notes (Signed)
  Subjective:    Patient ID: [name], female    DOB: 12-23-1960, 52 y.o.   MRN: [id number]  HPI Patient here today for follow-up. She was seen 1 month ago and at the time was not taking any diabetic meds- Started her on Kombligize XR 5/500mg - Doing well on meds. Blood sugars doing better. Averaging 120 fasting- No c/o low blood sugars. We also added Crestor to medications which she ius tolerating well.     Review of Systems  Constitutional: Negative.   HENT: Negative.   Eyes: Negative.   Respiratory: Negative.   Cardiovascular: Negative.   Gastrointestinal: Negative.   Genitourinary: Negative.   Musculoskeletal: Negative.   Neurological: Negative for dizziness, light-headedness and headaches.       Objective:   Physical Exam  Constitutional: She is oriented to person, place, and time. She appears well-developed and well-nourished.  Cardiovascular: Normal rate and normal heart sounds.   Pulmonary/Chest: Effort normal and breath sounds normal.  Neurological: She is alert and oriented to person, place, and time.    BP 126/67  Pulse 56  Temp(Src) 97 F (36.1 C) (Oral)  Ht 5\' 4"  (1.626 m)  Wt 251 lb (113.853 kg)  BMI 43.06 kg/m2 Results for orders placed in visit on 10/24/12  POCT GLYCOSYLATED HEMOGLOBIN (HGB A1C)      Result Value Range   Hemoglobin A1C 9.1%           Assessment & Plan:  1. Hypertension Continue Crestor as rx Low fat diet and exercise Re check labs in 2 months  2. Diabetes Low carb diet Recheck HgbA1C in 2 months  Mary-Margaret Daphine Deutscher, FNP

## 2012-12-03 ENCOUNTER — Encounter (HOSPITAL_COMMUNITY): Payer: Self-pay | Admitting: Emergency Medicine

## 2012-12-03 ENCOUNTER — Observation Stay (HOSPITAL_COMMUNITY)
Admission: EM | Admit: 2012-12-03 | Discharge: 2012-12-05 | Disposition: A | Discharge status: Home / Self Care | Payer: No Typology Code available for payment source | Attending: Internal Medicine | Admitting: Internal Medicine

## 2012-12-03 ENCOUNTER — Emergency Department (HOSPITAL_COMMUNITY): Payer: No Typology Code available for payment source

## 2012-12-03 DIAGNOSIS — M6281 Muscle weakness (generalized): Secondary | ICD-10-CM

## 2012-12-03 DIAGNOSIS — M7989 Other specified soft tissue disorders: Secondary | ICD-10-CM | POA: Insufficient documentation

## 2012-12-03 DIAGNOSIS — R209 Unspecified disturbances of skin sensation: Secondary | ICD-10-CM | POA: Insufficient documentation

## 2012-12-03 DIAGNOSIS — R079 Chest pain, unspecified: Principal | ICD-10-CM | POA: Insufficient documentation

## 2012-12-03 DIAGNOSIS — E039 Hypothyroidism, unspecified: Secondary | ICD-10-CM | POA: Insufficient documentation

## 2012-12-03 DIAGNOSIS — F411 Generalized anxiety disorder: Secondary | ICD-10-CM

## 2012-12-03 DIAGNOSIS — R42 Dizziness and giddiness: Secondary | ICD-10-CM | POA: Insufficient documentation

## 2012-12-03 DIAGNOSIS — E119 Type 2 diabetes mellitus without complications: Secondary | ICD-10-CM | POA: Insufficient documentation

## 2012-12-03 DIAGNOSIS — R531 Weakness: Secondary | ICD-10-CM | POA: Diagnosis present

## 2012-12-03 DIAGNOSIS — R197 Diarrhea, unspecified: Secondary | ICD-10-CM

## 2012-12-03 DIAGNOSIS — Z79899 Other long term (current) drug therapy: Secondary | ICD-10-CM | POA: Insufficient documentation

## 2012-12-03 DIAGNOSIS — M5020 Other cervical disc displacement, unspecified cervical region: Secondary | ICD-10-CM | POA: Insufficient documentation

## 2012-12-03 DIAGNOSIS — I10 Essential (primary) hypertension: Secondary | ICD-10-CM | POA: Insufficient documentation

## 2012-12-03 LAB — BASIC METABOLIC PANEL WITH GFR
BUN: 17 mg/dL (ref 6–23)
CO2: 26 meq/L (ref 19–32)
Calcium: 9.9 mg/dL (ref 8.4–10.5)
Chloride: 99 meq/L (ref 96–112)
Creatinine, Ser: 0.76 mg/dL (ref 0.50–1.10)
GFR calc Af Amer: >90 mL/min (ref >90)
GFR calc non Af Amer: >90 mL/min (ref >90)
Glucose, Bld: 156 mg/dL — ABNORMAL HIGH (ref 70–99)
Potassium: 3.8 meq/L (ref 3.5–5.1)
Sodium: 136 meq/L (ref 135–145)

## 2012-12-03 LAB — CBC
Hemoglobin: 13.4 g/dL (ref 12.0–15.0)
MCHC: 33.7 g/dL (ref 30.0–36.0)
RDW: 13.8 % (ref 11.5–15.5)
WBC: 10.5 10*3/uL (ref 4.0–10.5)

## 2012-12-03 LAB — TROPONIN I: Troponin I: <0.3 ng/mL (ref <0.30)

## 2012-12-03 MED ORDER — FENTANYL CITRATE 0.05 MG/ML IJ SOLN
25.0000 ug | INTRAMUSCULAR | Status: DC | PRN
  Administered 2012-12-04 – 2012-12-05 (×3): 25 ug via INTRAVENOUS
  Filled 2012-12-03 (×3): qty 2

## 2012-12-03 MED ORDER — NITROGLYCERIN 2 % TD OINT
0.5000 [in_us] | TOPICAL_OINTMENT | Freq: Once | TRANSDERMAL | Status: AC
  Administered 2012-12-03: 0.5 [in_us] via TOPICAL
  Filled 2012-12-03: qty 1

## 2012-12-03 NOTE — ED Notes (Signed)
Pt c/o near syncopal episode and nausea x 6hrs with hot flashes and sweating. Pt began complaining of cp and left arm and leg numbness upon arrival at ED. Pt NSR on monitor. Pt received 4mg  zofran en route. Vitals 120/80, 70 HR, 18 resp, 98 RA.

## 2012-12-03 NOTE — ED Provider Notes (Signed)
Medical screening examination/treatment/procedure(s) were performed by non-physician practitioner and as supervising physician I was immediately available for consultation/collaboration.   Liliyana Thobe, MD 12/03/12 2032 

## 2012-12-03 NOTE — ED Provider Notes (Signed)
History    CSN: [id number] Arrival date & time 12/03/12  1903  First MD Initiated Contact with Patient 12/03/12 2002     Chief Complaint  Patient presents with  . Near Syncope   (Consider location/radiation/quality/duration/timing/severity/associated sxs/prior Treatment) HPI Comments: Patient is a 52 year old female with a history of hypertension, hyperlipidemia, diabetes mellitus, and family history of MI in father at age 75 who presents for left-sided chest pain that onset 7 hours ago. Patient admits to 4 episodes of chest pain each lasting approximately 15 minutes. Pain was sharp, radiating to her R chest without aggravating or alleviating factors. Patient is to associated lightheadedness, dizziness, mild shortness of breath, nausea, diaphoresis, and L arm numbness with each episode. Patient denies recent fevers, cough, vomiting, abdominal pain, and extremity weakness. Last visit to cardiologist was 5 years ago. Patient last ate this AM.  The history is provided by the patient. No language interpreter was used.   Past Medical History  Diagnosis Date  . Chest pain   . Hyperlipidemia     Severe  . Hypertension   . Abdominal discomfort     Chronic abdominal and pelvic pain resulting in significant loss of time from work  . Asthma   . Diabetes mellitus   . Drug overdose 2009    60 Naprosyn, psychiatric admission  . Migraines   . Anxiety     with depression  . Hypothyroidism    Past Surgical History  Procedure Laterality Date  . Abdominal hysterectomy  02/2007    + lysis of adhesions  . Bilateral oophorectomy       in 2 surgeries prior to 9/08  . Ventral hernia repair      x3  . Appendectomy  1990s    ruptured, late 90s  . Colonoscopy  02/2010     normal upper endoscopy, single colonic polyp,   History reviewed. No pertinent family history. History  Substance Use Topics  . Smoking status: Never Smoker   . Smokeless tobacco: Never Used  . Alcohol Use: No   OB History    Grav Para Term Preterm Abortions TAB SAB Ect Mult Living                 Review of Systems  Constitutional: Positive for diaphoresis. Negative for fever.  Eyes: Negative for visual disturbance.  Respiratory: Positive for shortness of breath. Negative for cough.   Cardiovascular: Positive for chest pain.  Gastrointestinal: Positive for nausea. Negative for vomiting and abdominal pain.  Neurological: Positive for dizziness, light-headedness and numbness. Negative for weakness.  All other systems reviewed and are negative.    Allergies  Aspirin; Bee venom; Benadryl; Morphine and related; and Vicks formula 44 cough-cold pm  Home Medications   Current Outpatient Rx  Name  Route  Sig  Dispense  Refill  . benazepril (LOTENSIN) 20 MG tablet   Oral   Take 1 tablet (20 mg total) by mouth daily.   30 tablet   3   . buPROPion (WELLBUTRIN XL) 150 MG 24 hr tablet   Oral   Take 450 mg by mouth daily.         Marland Kitchen levothyroxine (LEVOTHROID) 75 MCG tablet   Oral   Take 1 tablet (75 mcg total) by mouth daily.   30 tablet   3   . propranolol (INDERAL) 40 MG tablet   Oral   Take 1 tablet (40 mg total) by mouth 2 (two) times daily.   30 tablet  3   . rosuvastatin (CRESTOR) 20 MG tablet   Oral   Take 20 mg by mouth daily.         . Saxagliptin-Metformin (KOMBIGLYZE XR) 5-500 MG TB24   Oral   Take 1 tablet by mouth daily.   35 tablet   0   . topiramate (TOPAMAX) 50 MG tablet   Oral   Take 1 tablet (50 mg total) by mouth 2 (two) times daily.   60 tablet   3     NTBS   . nitroGLYCERIN (NITROSTAT) 0.4 MG SL tablet   Sublingual   Place 0.4 mg under the tongue every 5 (five) minutes as needed.            BP 141/72  Pulse 71  Resp 15  SpO2 95% Physical Exam  Nursing note and vitals reviewed. Constitutional: She is oriented to person, place, and time. She appears well-developed and well-nourished. No distress.  Morbidly obese female; nontoxic and in NAD.  HENT:   Head: Normocephalic and atraumatic.  Mouth/Throat: Oropharynx is clear and moist. No oropharyngeal exudate.  Eyes: Conjunctivae and EOM are normal. Pupils are equal, round, and reactive to light. No scleral icterus.  Neck: Normal range of motion.  Cardiovascular: Normal rate, regular rhythm, normal heart sounds and intact distal pulses.   Pulmonary/Chest: Effort normal and breath sounds normal. No respiratory distress. She has no wheezes. She has no rales.  Abdominal: Soft. There is no tenderness. There is no rebound and no guarding.  Soft, obese abdomen  Musculoskeletal: Normal range of motion.  Neurological: She is alert and oriented to person, place, and time.  Skin: Skin is warm and dry. No rash noted. She is not diaphoretic. No erythema. No pallor.  Psychiatric: She has a normal mood and affect. Her behavior is normal.    ED Course  Procedures (including critical care time) Labs Reviewed  BASIC METABOLIC PANEL - Abnormal; Notable for the following:    Glucose, Bld 156 (*)    All other components within normal limits  CBC  TROPONIN I  TROPONIN I   Dg Chest 2 View  12/03/2012   *RADIOLOGY REPORT*  Clinical Data: Nausea, chest pain, diaphoresis  CHEST - 2 VIEW  Comparison: 12/01/2007  Findings: Low lung volumes evident.  Normal heart size and vascularity.  Negative for pneumonia or CHF.  No effusion or pneumothorax.  Trachea midline.  IMPRESSION: Low volume exam without acute chest process   Original Report Authenticated By: Judie Petit. Miles Costain, M.D.    Date: 12/03/2012  Rate: 64  Rhythm: normal sinus rhythm  QRS Axis: normal  Intervals: normal  ST/T Wave abnormalities: normal  Conduction Disutrbances:none  Narrative Interpretation: Normal sinus rhythm without STEMI or ischemic changes.  Old EKG Reviewed: unchanged from 04/13/2010 I have personally reviewed and interpreted this EKG   1. Chest pain     MDM  52 year old female who presents for left-sided chest pain with associated  nausea, diaphoresis, and shortness of breath. Patient with significant risk factors including coronary artery disease, hypertension, diabetes, hyperlipidemia and history of MI in father at age 101. EKG without STEMI or ischemic changes. No significant physical exam findings. Workup to include CBC, BMP, troponin, and chest x-ray. Second troponin to be ordered if first is within normal limits. Plan likely admission given HPI and risk factors.  Patient endorses improvement in symptoms with Nitroglyn; has allergy to morphine and aspirin. Chest x-ray without evidence of pneumonia, pleural effusion, pneumothorax, or other acute cardiopulmonary changes.  There is no leukocytosis or electrolyte imbalance, and kidney function preserved. Troponin x2 wnl. Given presentation of symptoms, risk factors, and FHx, however, will consult cardiology unassigned for admit.  Patient will be admitted to IM Team 10 Telemetry/Obs for cardiology to evaluate in AM. Temp admit orders placed.     Antony Madura, PA-C 12/03/12 2310

## 2012-12-03 NOTE — ED Notes (Signed)
Attempted to call report. Nurse to call back. 

## 2012-12-03 NOTE — ED Provider Notes (Signed)
MSE was initiated and I personally evaluated the patient and placed orders (Nitro paste, saline lock, bnp, bmp, cbc, troponin, chest xray,) at  7:24 PM on December 03, 2012.  Patient allergic to Morphine and Aspirin (anaphylaxis) therefore these medications have not been ordered.   The patient has significant risk factors for CAD, hypertension, diabetes, hyperlipidemia and family history (father died from MI). She last saw a cardiologist 5 years ago. 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 She finally told her boss she needed to come to the ED. She has had associated nausea and diaphoresis.  Patient has significant risk factors, if all labs and images are normal I would highly consider admitting for further work-up.    The patient appears stable so that the remainder of the MSE may be completed by another provider.       Dorthula Matas, PA-C 12/03/12 1938

## 2012-12-03 NOTE — ED Provider Notes (Signed)
Medical screening examination/treatment/procedure(s) were performed by non-physician practitioner and as supervising physician I was immediately available for consultation/collaboration.  Gilda Crease, MD 12/03/12 [phone]

## 2012-12-04 ENCOUNTER — Inpatient Hospital Stay (HOSPITAL_COMMUNITY): Payer: No Typology Code available for payment source

## 2012-12-04 ENCOUNTER — Encounter (HOSPITAL_COMMUNITY): Payer: Self-pay | Admitting: Radiology

## 2012-12-04 DIAGNOSIS — I517 Cardiomegaly: Secondary | ICD-10-CM

## 2012-12-04 DIAGNOSIS — R531 Weakness: Secondary | ICD-10-CM | POA: Diagnosis present

## 2012-12-04 DIAGNOSIS — R51 Headache: Secondary | ICD-10-CM

## 2012-12-04 DIAGNOSIS — R5381 Other malaise: Secondary | ICD-10-CM

## 2012-12-04 DIAGNOSIS — F411 Generalized anxiety disorder: Secondary | ICD-10-CM

## 2012-12-04 DIAGNOSIS — R079 Chest pain, unspecified: Principal | ICD-10-CM

## 2012-12-04 DIAGNOSIS — R209 Unspecified disturbances of skin sensation: Secondary | ICD-10-CM

## 2012-12-04 LAB — CBC
Hemoglobin: 11.7 g/dL — ABNORMAL LOW (ref 12.0–15.0)
MCH: 28.3 pg (ref 26.0–34.0)
Platelets: 167 10*3/uL (ref 150–400)
RBC: 4.14 MIL/uL (ref 3.87–5.11)
WBC: 6.4 10*3/uL (ref 4.0–10.5)

## 2012-12-04 LAB — GLUCOSE, CAPILLARY
Glucose-Capillary: 204 mg/dL — ABNORMAL HIGH (ref 70–99)
Glucose-Capillary: 239 mg/dL — ABNORMAL HIGH (ref 70–99)
Glucose-Capillary: 201 mg/dL — ABNORMAL HIGH (ref 70–99)

## 2012-12-04 LAB — CREATININE, SERUM
Creatinine, Ser: 0.74 mg/dL (ref 0.50–1.10)
GFR calc non Af Amer: >90 mL/min (ref >90)

## 2012-12-04 LAB — LIPID PANEL
Total CHOL/HDL Ratio: 8.1 RATIO
VLDL: UNDETERMINED mg/dL (ref 0–40)

## 2012-12-04 LAB — HEMOGLOBIN A1C
Hgb A1c MFr Bld: 8.5 % — ABNORMAL HIGH (ref <5.7)
Mean Plasma Glucose: 197 mg/dL — ABNORMAL HIGH (ref <117)

## 2012-12-04 LAB — TROPONIN I: Troponin I: <0.3 ng/mL (ref <0.30)

## 2012-12-04 LAB — D-DIMER, QUANTITATIVE: D-Dimer, Quant: <0.27 ug/mL-FEU (ref 0.00–0.48)

## 2012-12-04 MED ORDER — ATORVASTATIN CALCIUM 40 MG PO TABS
40.0000 mg | ORAL_TABLET | Freq: Every day | ORAL | Status: DC
  Administered 2012-12-04: 40 mg via ORAL
  Filled 2012-12-04 (×2): qty 1

## 2012-12-04 MED ORDER — INSULIN ASPART 100 UNIT/ML ~~LOC~~ SOLN
0.0000 [IU] | Freq: Three times a day (TID) | SUBCUTANEOUS | Status: DC
  Administered 2012-12-04 – 2012-12-05 (×5): 3 [IU] via SUBCUTANEOUS

## 2012-12-04 MED ORDER — BUPROPION HCL ER (XL) 300 MG PO TB24
450.0000 mg | ORAL_TABLET | Freq: Every day | ORAL | Status: DC
  Administered 2012-12-05: 450 mg via ORAL
  Filled 2012-12-04: qty 1

## 2012-12-04 MED ORDER — ACETAMINOPHEN 500 MG PO TABS
1000.0000 mg | ORAL_TABLET | Freq: Four times a day (QID) | ORAL | Status: DC | PRN
  Filled 2012-12-04: qty 2

## 2012-12-04 MED ORDER — CLOPIDOGREL BISULFATE 75 MG PO TABS
75.0000 mg | ORAL_TABLET | Freq: Once | ORAL | Status: AC
  Administered 2012-12-04: 75 mg via ORAL
  Filled 2012-12-04: qty 1

## 2012-12-04 MED ORDER — SENNOSIDES-DOCUSATE SODIUM 8.6-50 MG PO TABS
1.0000 | ORAL_TABLET | Freq: Every evening | ORAL | Status: DC | PRN
  Filled 2012-12-04: qty 1

## 2012-12-04 MED ORDER — ENOXAPARIN SODIUM 40 MG/0.4ML ~~LOC~~ SOLN
40.0000 mg | Freq: Every day | SUBCUTANEOUS | Status: DC
  Administered 2012-12-04 – 2012-12-05 (×2): 40 mg via SUBCUTANEOUS
  Filled 2012-12-04 (×2): qty 0.4

## 2012-12-04 MED ORDER — LEVOTHYROXINE SODIUM 75 MCG PO TABS
75.0000 ug | ORAL_TABLET | Freq: Every day | ORAL | Status: DC
  Administered 2012-12-04 – 2012-12-05 (×2): 75 ug via ORAL
  Filled 2012-12-04 (×4): qty 1

## 2012-12-04 MED ORDER — SODIUM CHLORIDE 0.9 % IJ SOLN
3.0000 mL | Freq: Two times a day (BID) | INTRAMUSCULAR | Status: DC
  Administered 2012-12-04 – 2012-12-05 (×2): 3 mL via INTRAVENOUS

## 2012-12-04 MED ORDER — NITROGLYCERIN 0.4 MG SL SUBL: 0.4000 mg | SUBLINGUAL_TABLET | SUBLINGUAL | Status: DC | PRN

## 2012-12-04 MED ORDER — INSULIN ASPART 100 UNIT/ML ~~LOC~~ SOLN: 0.0000 [IU] | Freq: Every day | SUBCUTANEOUS | Status: DC

## 2012-12-04 NOTE — Progress Notes (Signed)
  Echocardiogram 2D Echocardiogram has been performed.  [name] 12/04/2012, 12:17 PM 

## 2012-12-04 NOTE — Consult Note (Signed)
Cardiology IP Consult  Reason for Consult:chest pain Referring Physician: Dr. Blake Divine, hospitalist   HPI: [name] is a 52 y.o.female with hx below relevant for multiple CHD risk factors including DM, HTN, HLD, obesity, family hx of CAD who is admitted to the Hospitalist Service with chest pain, left sided numbness. She reports that she was in her normal state of health until today. She works in the [employer]. 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 Soon after, she began to develop chest pressure. Central. Constant. Occasional sharp twinges that would shoot to her back that were more severe. No other associated sx. When these sx persisted, she told her boss who called EMS. Her evaluation in the ED was reassuring and included 2 negative troponins and a normal ECG and CXR. She was admitted to the hospitalist. She continues to have her ongoing sx. Neurology and cardiology are being consulted for assistance in mgmt.    Past Medical History  Diagnosis Date  . Chest pain   . Hyperlipidemia     Severe  . Hypertension   . Abdominal discomfort     Chronic abdominal and pelvic pain resulting in significant loss of time from work  . Asthma   . Diabetes mellitus   . Drug overdose 2009    60 Naprosyn, psychiatric admission  . Migraines   . Anxiety     with depression  . Hypothyroidism     Past Surgical History  Procedure Laterality Date  . Abdominal hysterectomy  02/2007    + lysis of adhesions  . Bilateral oophorectomy       in 2 surgeries prior to 9/08  . Ventral hernia repair      x3  . Appendectomy  1990s    ruptured, late 90s  . Colonoscopy  02/2010     normal upper endoscopy, single colonic polyp,    History reviewed. No pertinent family history.  Social History:  reports that she has never smoked. She has never used smokeless tobacco. She reports that she does not drink alcohol or use illicit  drugs.  Allergies:  Allergies  Allergen Reactions  . Aspirin Anaphylaxis  . Bee Venom Anaphylaxis  . Benadryl (Diphenhydramine Hcl) Anaphylaxis  . Morphine And Related Shortness Of Breath and Itching  . Vicks Formula 44 Cough-Cold Pm (Dm-Apap-Cpm) Shortness Of Breath and Rash    Not anaphylaxis    Current Facility-Administered Medications  Medication Dose Route Frequency Provider Last Rate Last Dose  . atorvastatin (LIPITOR) tablet 40 mg  40 mg Oral q1800 Kathlen Mody, MD      . Melene Muller ON 12/05/2012] buPROPion (WELLBUTRIN XL) 24 hr tablet 450 mg  450 mg Oral Daily Kathlen Mody, MD      . clopidogrel (PLAVIX) tablet 75 mg  75 mg Oral Once Leanne Chang, NP      . enoxaparin (LOVENOX) injection 40 mg  40 mg Subcutaneous Daily Kathlen Mody, MD      . fentaNYL (SUBLIMAZE) injection 25 mcg  25 mcg Intravenous Q2H PRN Antony Madura, PA-C      . insulin aspart (novoLOG) injection 0-5 Units  0-5 Units Subcutaneous QHS Kathlen Mody, MD      . insulin aspart (novoLOG) injection 0-9 Units  0-9 Units Subcutaneous TID WC Kathlen Mody, MD      . levothyroxine (SYNTHROID, LEVOTHROID) tablet 75 mcg  75 mcg Oral QAC breakfast Vijaya  Blake Divine, MD      . nitroGLYCERIN (NITROSTAT) SL tablet 0.4 mg  0.4 mg Sublingual Q5 min PRN Kathlen Mody, MD      . senna-docusate (Senokot-S) tablet 1 tablet  1 tablet Oral QHS PRN Kathlen Mody, MD        ROS: A full review of systems is obtained and is negative except as noted in the HPI.  Physical Exam: Blood pressure 157/80, pulse 64, temperature 98.1 F (36.7 C), temperature source Oral, resp. rate 16, height 5\' 4"  (1.626 m), SpO2 100.00%.  GENERAL: no acute distress. Pleasant. Appears comfortable. Does report current 5/10 chest pressure.  EYES: Extra ocular movements are intact. There is no lid lag. Sclera is anicteric.  ENT: Oropharynx is clear. Dentition is within normal limits.  NECK: Supple. The thyroid is not enlarged.  LYMPH: There are no masses or  lymphadenopathy present.  HEART: Regular rate and rhythm. Early systolic murmur II/VI at LSB. Normal S1/S2. JVD difficult to assess 2/2 body habitus.  LUNGS: Clear to auscultation There are no rales, rhonchi, or wheezes.  ABDOMEN: Obese. Soft, non-tender, and non-distended with normoactive bowel sounds.  EXTREMITIES: No clubbing, cyanosis, or edema.  PULSES: Carotids were +2 and equal bilaterally with no bruits. DP/PT pulses were +2 and equal bilaterally.  SKIN: Warm, dry, and intact.  NEUROLOGIC: The patient was oriented to person, place, and time. Left hand grip is 3-4/5. Right hand grip 5/5.   PSYCH: Normal judgment and insight, mood is appropriate.    Results: Results for orders placed during the hospital encounter of 12/03/12 (from the past 24 hour(s))  CBC     Status: None   Collection Time    12/03/12  7:26 PM      Result Value Range   WBC 10.5  4.0 - 10.5 K/uL   RBC 4.68  3.87 - 5.11 MIL/uL   Hemoglobin 13.4  12.0 - 15.0 g/dL   HCT 16.1  09.6 - 04.5 %   MCV 85.0  78.0 - 100.0 fL   MCH 28.6  26.0 - 34.0 pg   MCHC 33.7  30.0 - 36.0 g/dL   RDW 40.9  81.1 - 91.4 %   Platelets 216  150 - 400 K/uL  TROPONIN I     Status: None   Collection Time    12/03/12  7:26 PM      Result Value Range   Troponin I <0.30  <0.30 ng/mL  BASIC METABOLIC PANEL     Status: Abnormal   Collection Time    12/03/12  7:26 PM      Result Value Range   Sodium 136  135 - 145 mEq/L   Potassium 3.8  3.5 - 5.1 mEq/L   Chloride 99  96 - 112 mEq/L   CO2 26  19 - 32 mEq/L   Glucose, Bld 156 (*) 70 - 99 mg/dL   BUN 17  6 - 23 mg/dL   Creatinine, Ser 7.82  0.50 - 1.10 mg/dL   Calcium 9.9  8.4 - 95.6 mg/dL   GFR calc non Af Amer >90  >90 mL/min   GFR calc Af Amer >90  >90 mL/min  TROPONIN I     Status: None   Collection Time    12/03/12 10:10 PM      Result Value Range   Troponin I <0.30  <0.30 ng/mL    CXR: reviewed EKG: NSR. Normal ECG.   Assessment/Plan: 52 y/o female with multiple CHD risk  factors now  with chest pressure and left-sided numbness, tingling. Her CV exam is unremarkable, biomarkers have been reassuring and her ECG is normal. She does have mild voluntary evidence of weakness involving her left arm and leg and is being evaluated by neurology concurrently for a possible CVA. (MR Brain pending.) Given her negative and reassuring initial cardiac evaluation, would defer further cardiac work-up until her neurologic status is more clear.   I do not believe that there is a strong indication at this time to initiate anticoagulation with heparin products or anti-platelet agents. Agree with echo and continued neurologic evaluation to rule out CVA. If this work-up is negative and acute stroke has been excluded, would then recommend stress test to assess for obstructive CAD.   1. Continue to rule out ACS with serial troponins and ECGs.  2. Will follow echo results.  3. Would hold on loading with clopidogrel or anti-coagulation at this time.  4. Tentatively plan for stress test before discharge after CVA has been excluded.  5. If patient develops higher risk markers of acute coronary syndrome (ischemic ECG changes, elevated troponin, etc), would then consider invasive coronary evaluation and standard ACS therapies.  6. Agree with CT Angio if D-dimer is elevated to exclude life threatening non-cardiac causes of chest pain.  7. Remainder of plan pending results of studies above and clinical course.    Rital Cavey 12/04/2012, 3:29 AM

## 2012-12-04 NOTE — Progress Notes (Signed)
[facility] PROGRESS NOTE  BYRD TERRERO ZOX:[id number] DOB: Sep 11, 1960 DOA: 12/03/2012 PCP: Rudi Heap, MD  HPI/Subjective: Pt describes continued "heavy chest pressure" all across chest that radiates to between the scapulae and weakness with numbness of her left lower and upper extremities.  She mentions not taking any of her normal medications yesterday.   Assessment/Plan: Principle problem  Chest pain and left left sided numbness, tingling and weakness - serial troponins negative and normal ECG; normal CT head; d-dimer not elevated - continue ACS workup if patient develops higher risk markers  - echocardiogram ordered - per cardio, no anticoagulation at this time; pt is allergic to aspirin - neuro recommended MRI brain and C-spine for CVA workup; results pending - continue lipitor  Diabetes Mellitus  - Hb A1c is 9.1  - oral medications on hold.  - SSI.   Hypothyroidism;  - resume synthroid.   DVT prophylaxis      Code Status: Full Family Communication: none Disposition Plan: Inpatient   Consultants:  Cardiology and Neurology  Procedures:  None  Antibiotics:  None    Objective: Filed Vitals:   12/04/12 0410 12/04/12 0610 12/04/12 0645 12/04/12 0800  BP: 119/71 120/71  111/45  Pulse: 75 74  70  Temp:    98.1 F (36.7 C)  TempSrc:    Oral  Resp:    17  Height:      Weight:   110.269 kg (243 lb 1.6 oz)   SpO2:    97%   No intake or output data in the 24 hours ending 12/04/12 1117 Filed Weights   12/04/12 0645  Weight: 110.269 kg (243 lb 1.6 oz)    Exam:   General:  WDWN female in no acute distress  Cardiovascular: RRR, no murmurs/gallops/rubs, no JVD  Respiratory: clear to auscultation bilaterally, no increased work of breathing  Abdomen: +BS, soft, nontender, no distension, no masses or hepatosplenomegaly  Musculoskeletal: no peripheral edema, full ROM upper and lower extremities  Neurologic: A&O x3, no facial  asymmetry, 4/5 strength in left arm and leg, 5/5 in right arm/leg. Pupils, equal round, and reactive to light. EOMs intact. Sensation intact.   Data Reviewed: Basic Metabolic Panel:  Recent Labs Lab 12/03/12 1926 12/04/12 0602  NA 136  --   K 3.8  --   CL 99  --   CO2 26  --   GLUCOSE 156*  --   BUN 17  --   CREATININE 0.76 0.74  CALCIUM 9.9  --    Liver Function Tests: No results found for this basename: AST, ALT, ALKPHOS, BILITOT, PROT, ALBUMIN,  in the last 168 hours No results found for this basename: LIPASE, AMYLASE,  in the last 168 hours No results found for this basename: AMMONIA,  in the last 168 hours CBC:  Recent Labs Lab 12/03/12 1926 12/04/12 0602  WBC 10.5 6.4  HGB 13.4 11.7*  HCT 39.8 35.5*  MCV 85.0 85.7  PLT 216 167   Cardiac Enzymes:  Recent Labs Lab 12/03/12 1926 12/03/12 2210 12/04/12 0602  TROPONINI <0.30 <0.30 <0.30   BNP (last 3 results) No results found for this basename: PROBNP,  in the last 8760 hours CBG:  Recent Labs Lab 12/04/12 0736  GLUCAP 204*    No results found for this or any previous visit (from the past 240 hour(s)).   Studies: Dg Chest 2 View  12/03/2012   *RADIOLOGY REPORT*  Clinical Data: Nausea, chest pain, diaphoresis  CHEST - 2 VIEW  Comparison: 12/01/2007  Findings: Low lung volumes evident.  Normal heart size and vascularity.  Negative for pneumonia or CHF.  No effusion or pneumothorax.  Trachea midline.  IMPRESSION: Low volume exam without acute chest process   Original Report Authenticated By: Judie Petit. Miles Costain, M.D.   Ct Head Wo Contrast  12/04/2012   *RADIOLOGY REPORT*  Clinical Data: Left lower extremity pain.  CT HEAD WITHOUT CONTRAST  Technique:  Contiguous axial images were obtained from the base of the skull through the vertex without contrast.  Comparison: 02/26/2011  Findings: No acute intracranial abnormality.  Specifically, no hemorrhage, hydrocephalus, mass lesion, acute infarction, or significant  intracranial injury.  No acute calvarial abnormality. Visualized paranasal sinuses and mastoids clear.  Orbital soft tissues unremarkable.  IMPRESSION: No acute intracranial abnormality.   Original Report Authenticated By: Charlett Nose, M.D.    Scheduled Meds: . atorvastatin  40 mg Oral q1800  . [START ON 12/05/2012] buPROPion  450 mg Oral Daily  . enoxaparin (LOVENOX) injection  40 mg Subcutaneous Daily  . insulin aspart  0-5 Units Subcutaneous QHS  . insulin aspart  0-9 Units Subcutaneous TID WC  . levothyroxine  75 mcg Oral QAC breakfast   Continuous Infusions:   Active Problems:   DIABETES MELLITUS, TYPE II   CHEST PAIN   Hypothyroidism   Hypertension   Left-sided weakness    Time spent: 35 minutes    Maris Berger PA-S  [facility] Pager [phone]. If 7PM-7AM, please contact night-coverage at [email or web address], password [facility] 12/04/2012, 11:17 AM  LOS: 1 day     Addendum  Patient seen and examined, chart and data base reviewed.  I agree with the above assessment and plan.  For full details please see Mrs. Maris Berger PA-S note.  Chest pain and left-sided numbness/weakness, 2-D echo is pending as well as MRI.   Clint Lipps, MD [facility] Pager: [phone] 12/04/2012, 2:17 PM

## 2012-12-04 NOTE — Progress Notes (Signed)
Patient Name: [name] Date of Encounter: 12/04/2012  Active Problems:   DIABETES MELLITUS, TYPE II   CHEST PAIN   Hypothyroidism   Hypertension   Left-sided weakness    SUBJECTIVE:  Pt continuing to have left weakness and numbness. Chest pain (pressure) currently 5/10. Has been continuous since it began yesterday am. Severe HA now as well.   OBJECTIVE Filed Vitals:   12/04/12 0210 12/04/12 0410 12/04/12 0610 12/04/12 0645  BP: 110/72 119/71 120/71   Pulse: 64 75 74   Temp: 98 F (36.7 C)     TempSrc: Oral     Resp: 18     Height:      Weight:    243 lb 1.6 oz (110.269 kg)  SpO2: 98%      No intake or output data in the 24 hours ending 12/04/12 0801 Filed Weights   12/04/12 0645  Weight: 243 lb 1.6 oz (110.269 kg)    PHYSICAL EXAM General: Well developed, well nourished, female in mild distress. Head: Normocephalic, atraumatic.  Neck: Supple without bruits, JVD not elevated. Lungs:  Resp regular and unlabored, CTA bilaterally. Heart: RRR, S1, S2, no S3, S4, 2/6 murmur; no rub. Abdomen: Soft, non-tender, non-distended, BS + x 4.  Extremities: No clubbing, cyanosis, no edema.  Neuro: Alert and oriented X 3. Moves all extremities spontaneously. Pt continuing to have left weakness and numbness. Psych: Normal affect.  LABS: CBC:  Recent Labs  12/03/12 1926 12/04/12 0602  WBC 10.5 6.4  HGB 13.4 11.7*  HCT 39.8 35.5*  MCV 85.0 85.7  PLT 216 167   INR:No results found for this basename: INR,  in the last 72 hours Basic Metabolic Panel:  Recent Labs  16/10/96 1926 12/04/12 0602  NA 136  --   K 3.8  --   CL 99  --   CO2 26  --   GLUCOSE 156*  --   BUN 17  --   CREATININE 0.76 0.74  CALCIUM 9.9  --    Cardiac Enzymes:  Recent Labs  12/03/12 1926 12/03/12 2210 12/04/12 0602  TROPONINI <0.30 <0.30 <0.30   D-dimer:  Recent Labs  12/04/12 0602  DDIMER <0.27   Fasting Lipid Panel:  Recent Labs  12/04/12 0602  CHOL 252*  HDL  31*  LDLCALC UNABLE TO CALCULATE IF TRIGLYCERIDE OVER 400 mg/dL  TRIG 045*  CHOLHDL 8.1   TELE:  SR    ECG: 04-Dec-2012 04:31:26 M Normal sinus rhythm Normal ECG Vent. rate 63 BPM PR interval 178 ms QRS duration 76 ms QT/QTc 446/456 ms P-R-T axes 52 20 38  Radiology/Studies: Dg Chest 2 View 12/03/2012   *RADIOLOGY REPORT*  Clinical Data: Nausea, chest pain, diaphoresis  CHEST - 2 VIEW  Comparison: 12/01/2007  Findings: Low lung volumes evident.  Normal heart size and vascularity.  Negative for pneumonia or CHF.  No effusion or pneumothorax.  Trachea midline.  IMPRESSION: Low volume exam without acute chest process   Original Report Authenticated By: Judie Petit. Miles Costain, M.D.   Ct Head Wo Contrast 12/04/2012   *RADIOLOGY REPORT*  Clinical Data: Left lower extremity pain.  CT HEAD WITHOUT CONTRAST  Technique:  Contiguous axial images were obtained from the base of the skull through the vertex without contrast.  Comparison: 02/26/2011  Findings: No acute intracranial abnormality.  Specifically, no hemorrhage, hydrocephalus, mass lesion, acute infarction, or significant intracranial injury.  No acute calvarial abnormality. Visualized paranasal sinuses and mastoids clear.  Orbital soft tissues unremarkable.  IMPRESSION: No acute intracranial abnormality.   Original Report Authenticated By: Charlett Nose, M.D.     Current Medications:  . atorvastatin  40 mg Oral q1800  . [START ON 12/05/2012] buPROPion  450 mg Oral Daily  . enoxaparin (LOVENOX) injection  40 mg Subcutaneous Daily  . insulin aspart  0-5 Units Subcutaneous QHS  . insulin aspart  0-9 Units Subcutaneous TID WC  . levothyroxine  75 mcg Oral QAC breakfast      ASSESSMENT AND PLAN:   CHEST PAIN - Cardiac enzymes negative for MI and ECG with no acute ischemic changes. Started about 10 am yesterday. Will ck echo, if EF normal, consider deferring further eval for recurrent symptoms, pt can f/u after d/c. No more nitrates.   Otherwise, per IM.  Pt continuing to have left weakness and numbness. Active Problems:   DIABETES MELLITUS, TYPE II   Hypothyroidism   Hypertension   Left-sided weakness   Signed, Theodore Demark , PA-C 8:01 AM 12/04/2012 Patient examined and agree except changes made. Awaiting echocardiogram.  Valera Castle, MD 12/04/2012 12:57 PM

## 2012-12-04 NOTE — H&P (Addendum)
[facility] History and Physical  [name] ZOX:[id number] DOB: 12/19/1960 DOA: 12/03/2012  Referring physician: Tresa Endo PA C PCP: Rudi Heap, MD  Specialists: none  Chief Complaint: near syncope.   HPI: [name] is a 52 y.o. female with h/o hypertension, DM, asthma, came in to ED after few episodes of chest pain, dizziness and near syncope, this afternoon. Her symptoms started at 1 pm, when she was driving, she felt dizziness, faint,  And had precordial chest pain radiating to the right side,  associated with left arm numbness ,   diaphoresis. she pulled off the road, her symptoms persisted for 15 minutes and slowly improved . She reports chest pressure did not completely resolve, still has chest pressure 5 to 6/10. No vomiting. Not on aspirin at home. She reports family h/o CAD in father at the age of 64. On arrival to ED, she was worked up for ACS , was found to have 2 neg troponins, and an EKG without any ST T wave abnormalities, because of her family history and risk factors,  She is being admitted to medical service for evaluation of ACS and left sided weakness. She reported that she was seen by [facility] 5 years ago in [place] ,.     Review of Systems: The patient denies anorexia, fever, weight loss,, vision loss, decreased hearing, hoarseness,  syncope, dyspnea on exertion, peripheral edema, hemoptysis, abdominal pain, melena, hematochezia, severe indigestion/heartburn, hematuria, incontinence, genital sores, muscle weakness, suspicious skin lesions, transient blindness, difficulty walking, depression, unusual weight change, abnormal bleeding, enlarged lymph nodes, angioedema, and breast masses.    Past Medical History  Diagnosis Date  . Chest pain   . Hyperlipidemia     Severe  . Hypertension   . Abdominal discomfort     Chronic abdominal and pelvic pain resulting in significant loss of time from work  . Asthma   . Diabetes mellitus   .  Drug overdose 2009    60 Naprosyn, psychiatric admission  . Migraines   . Anxiety     with depression  . Hypothyroidism    Past Surgical History  Procedure Laterality Date  . Abdominal hysterectomy  02/2007    + lysis of adhesions  . Bilateral oophorectomy       in 2 surgeries prior to 9/08  . Ventral hernia repair      x3  . Appendectomy  1990s    ruptured, late 90s  . Colonoscopy  02/2010     normal upper endoscopy, single colonic polyp,   Social History:  reports that she has never smoked. She has never used smokeless tobacco. She reports that she does not drink alcohol or use illicit drugs.  where does patient live--home Allergies  Allergen Reactions  . Aspirin Anaphylaxis  . Bee Venom Anaphylaxis  . Benadryl (Diphenhydramine Hcl) Anaphylaxis  . Morphine And Related Shortness Of Breath and Itching  . Vicks Formula 44 Cough-Cold Pm (Dm-Apap-Cpm) Shortness Of Breath and Rash    Not anaphylaxis    History reviewed. No pertinent family history.  Cad & stroke in family.  CAD/MI in father.   Prior to Admission medications   Medication Sig Start Date End Date Taking? Authorizing Provider  benazepril (LOTENSIN) 20 MG tablet Take 1 tablet (20 mg total) by mouth daily. 09/24/12  Yes Mary-Margaret Daphine Deutscher, FNP  buPROPion (WELLBUTRIN XL) 150 MG 24 hr tablet Take 450 mg by mouth daily.   Yes Historical Provider, MD  levothyroxine (LEVOTHROID) 75 MCG tablet Take  1 tablet (75 mcg total) by mouth daily. 09/24/12  Yes Mary-Margaret Daphine Deutscher, FNP  propranolol (INDERAL) 40 MG tablet Take 1 tablet (40 mg total) by mouth 2 (two) times daily. 09/24/12  Yes Mary-Margaret Daphine Deutscher, FNP  rosuvastatin (CRESTOR) 20 MG tablet Take 20 mg by mouth daily.   Yes Historical Provider, MD  Saxagliptin-Metformin (KOMBIGLYZE XR) 5-500 MG TB24 Take 1 tablet by mouth daily. 09/24/12  Yes Mary-Margaret Daphine Deutscher, FNP  topiramate (TOPAMAX) 50 MG tablet Take 1 tablet (50 mg total) by mouth 2 (two) times daily. 09/24/12  Yes  Mary-Margaret Daphine Deutscher, FNP  nitroGLYCERIN (NITROSTAT) 0.4 MG SL tablet Place 0.4 mg under the tongue every 5 (five) minutes as needed.      Historical Provider, MD   Physical Exam: Filed Vitals:   12/03/12 2200 12/03/12 2215 12/03/12 2230 12/04/12 0018  BP: 139/74 125/79 141/72 157/80  Pulse: 66 65 71 64  Temp:    98.1 F (36.7 C)  TempSrc:    Oral  Resp: 15 15 15 16   Height:    5\' 4"  (1.626 m)  SpO2: 99% 97% 95% 100%    Constitutional: Vital signs reviewed.  Patient is a well-developed and well-nourished in no acute distress and cooperative with exam. Alert and oriented x3.  Head: Normocephalic and atraumatic Mouth: no erythema or exudates, MMM Eyes: PERRL, EOMI, conjunctivae normal, No scleral icterus.  Neck: Supple, Trachea midline normal ROM, No JVD, mass, thyromegaly, or carotid bruit present.  Cardiovascular: RRR, S1 normal, S2 normal, no MRG, pulses symmetric and intact bilaterally Pulmonary/Chest: normal respiratory effort, CTAB, no wheezes, rales, or rhonchi Abdominal: Soft. Non-tender, non-distended, bowel sounds are normal, no masses, organomegaly, or guarding present.  Musculoskeletal: No joint deformities, erythema, or stiffness, ROM full and no nontender Neurological: A&O x3,  Slightly weak on the left side, no facial asymmetry , no speech abnormalities, unsteady on the feet. cranial nerve II-XII are grossly intact, subjective numbness on the left arm and left leg.  Skin: Warm, dry and intact. No rash, cyanosis, or clubbing.  Psychiatric: Normal mood and affect. speech and behavior is normal.    Labs on Admission:  Basic Metabolic Panel:  Recent Labs Lab 12/03/12 1926  NA 136  K 3.8  CL 99  CO2 26  GLUCOSE 156*  BUN 17  CREATININE 0.76  CALCIUM 9.9   Liver Function Tests: No results found for this basename: AST, ALT, ALKPHOS, BILITOT, PROT, ALBUMIN,  in the last 168 hours No results found for this basename: LIPASE, AMYLASE,  in the last 168 hours No  results found for this basename: AMMONIA,  in the last 168 hours CBC:  Recent Labs Lab 12/03/12 1926  WBC 10.5  HGB 13.4  HCT 39.8  MCV 85.0  PLT 216   Cardiac Enzymes:  Recent Labs Lab 12/03/12 1926 12/03/12 2210  TROPONINI <0.30 <0.30    BNP (last 3 results) No results found for this basename: PROBNP,  in the last 8760 hours CBG: No results found for this basename: GLUCAP,  in the last 168 hours  Radiological Exams on Admission: Dg Chest 2 View  12/03/2012   *RADIOLOGY REPORT*  Clinical Data: Nausea, chest pain, diaphoresis  CHEST - 2 VIEW  Comparison: 12/01/2007  Findings: Low lung volumes evident.  Normal heart size and vascularity.  Negative for pneumonia or CHF.  No effusion or pneumothorax.  Trachea midline.  IMPRESSION: Low volume exam without acute chest process   Original Report Authenticated By: Judie Petit. Miles Costain, M.D.  EKG: NSR  Assessment/Plan Active Problems:  1. Chest pain at rest, intermittent, but chest heaviness 6/10 now. - admit to telemetry and evaluate for ACS.  -  2 troponin's negative and 3rd pending, d dimer is pending. If elevated will order CT angio of the chest for evaluation of PE.   - EKG shows NSR.  - She did not get aspirin in ED because of allergy.  - echocardiogram ordered.  - since she complained of left sided weakness and numbness we will get a CT head without contrast and if negative for bleed will  Give her a dose of plavix, and follow up with an MRI of the brain. Neurology Dr Amada Jupiter consulted for further recommendations.  - called Dr Ronney Asters from [facility] cardiology for consultation, for further recommendations, specifically to start her on IV heparin.  - Resume lipitor. Will need to add B blockers .   2. Diabetes Mellitus:  - hgba1c is 9.1  - oral medications on hold.  - SSI.   3. Hypothyroidism;  - resume synthroid.   4. DVT prophylaxis.     Code Status: full code Family Communication: none at bedside Disposition Plan:  pending.   Time spent: 90 min  Dimitrious Micciche [facility] Pager [phone]  If 7PM-7AM, please contact night-coverage [email or web address] Password [email or web address] 12/04/2012, 1:28 AM

## 2012-12-04 NOTE — Care Management Note (Signed)
    Page 1 of 1   12/04/2012     4:32:06 PM   CARE MANAGEMENT NOTE 12/04/2012  Patient:  [name], [name]   Account Number:  [id number]  Date Initiated:  12/04/2012  Documentation initiated by:  Donn Pierini  Subjective/Objective Assessment:   Pt admitted with near syncope r/o CVA     Action/Plan:   PTA pt lived at home with spouse- independent   Anticipated DC Date:  12/05/2012   Anticipated DC Plan:  HOME/SELF CARE      DC Planning Services  CM consult      Choice offered to / List presented to:             Status of service:  Completed, signed off Medicare Important Message given?   (If response is "NO", the following Medicare IM given date fields will be blank) Date Medicare IM given:   Date Additional Medicare IM given:    Discharge Disposition:  HOME/SELF CARE  Per UR Regulation:  Reviewed for med. necessity/level of care/duration of stay  If discussed at Long Length of Stay Meetings, dates discussed:    Comments:  12/04/12- 1620- Donn Pierini RN, BSN [phone] Referral received for medication assistance- spoke with pt at bedside- pt showing that she has Tedd Sias- confirmed with pt that this is correct- per conversation with pt she works part time and is on her [employer]- which has high copays for her medications or does not cover them at all. She is followed by [facility] and often gets samples from her PCP for some of her meds. Explained to pt that she would not be eligible for any assistance because she has insurance- but discussed ways to maybe reduce cost of meds such as speaking with PCP about any medication changes to lower cost meds, looking for assistance programs from drug companies (pt familiar with assistance programs) pt to f/u with PCP regarding ways she might can reduce med. cost. No further needs identified.

## 2012-12-04 NOTE — Consult Note (Signed)
Neurology Consultation Reason for Consult: Left-sided weakness Referring Physician: Thurman Coyer  CC: Left-sided weakness  History is obtained from: Patient, referring physician  HPI: [name] is a 52 y.o. female with a history of hypertension, hyperlipidemia, diabetes and migraines who presents with left-sided symptoms that started at 1 PM today. She states that she has had some symptoms present since that time, but they have migrated. Moving down her side. She also notes tingling of her left hand. She currently has a headache, and does have a history of migraines.  She has complained of chest pain chest pressure throughout the day, starting at 1 PM and has been present ever since that time.  She does complain of some left neck pain.   LKW: 1 PM tpa given: no, outside of window Nihss: 3    ROS: A 14 point ROS was performed and is negative except as noted in the HPI.  Past Medical History  Diagnosis Date  . Chest pain   . Hyperlipidemia     Severe  . Hypertension   . Abdominal discomfort     Chronic abdominal and pelvic pain resulting in significant loss of time from work  . Asthma   . Diabetes mellitus   . Drug overdose 2009    60 Naprosyn, psychiatric admission  . Migraines   . Anxiety     with depression  . Hypothyroidism     Family History: Father-stroke  Social History: Tob: Denies  Exam: Current vital signs: BP 157/80  Pulse 64  Temp(Src) 98.1 F (36.7 C) (Oral)  Resp 16  Ht 5\' 4"  (1.626 m)  SpO2 100% Vital signs in last 24 hours: Temp:  [98.1 F (36.7 C)] 98.1 F (36.7 C) (06/26 0018) Pulse Rate:  [61-71] 64 (06/26 0018) Resp:  [15-21] 16 (06/26 0018) BP: (112-157)/(51-84) 157/80 mmHg (06/26 0018) SpO2:  [95 %-100 %] 100 % (06/26 0018)  General: In bed, NAD CV: Regular rate and rhythm Mental Status: Patient is awake, alert, oriented to person, place, month, year, and situation. Immediate and remote memory are intact. Patient is able  to give a clear and coherent history. No signs of aphasia or neglect Cranial Nerves: II: Visual Fields are full. Pupils are equal, round, and reactive to light.  Discs are difficult to visualize. III,IV, VI: EOMI without ptosis or diploplia.  V: Facial sensation is symmetric to temperature VII: Facial movement is symmetric.  VIII: hearing is intact to voice X: Uvula elevates symmetrically XI: Shoulder shrug is symmetric. XII: tongue is midline without atrophy or fasciculations.  Motor: Tone is normal. Bulk is normal. 5/5 strength was present in right arm and leg, 4/5 weakness in left arm and leg. She has a downward drift without pronation of the left arm and no orbital sign Sensory: Sensation is diminished to light touch throughout the left side, excluding the face Deep Tendon Reflexes: 2+ and symmetric in the biceps and patellae.  Plantars: Toes are downgoing bilaterally.  Cerebellar: FNF are intact bilaterally Gait: Patient has a stable casual gait.   I have reviewed labs in epic and the results pertinent to this consultation are: Normal cardiac enzymes  Impression: 52 year old female with hypertension, hyperlipidemia, diabetes and new onset left-sided numbness and weakness. It is possible that she has a small subcortical infarct. Though there is some risk of hemorrhagic conversion with acute infarcts, the risk and a small infarct is relatively small and if there is a high concern for acute coronary syndrome then I feel  that it would be reasonable to treat with anticoagulation if needed.  Other possibilities besides a small stroke include complicated migraine or cervical spine disease.  Recommendations: 1) CT head - if no bleed, then I do not see a contraindication to treating her as acute coronary syndrome if there is a high suspicion for this.  2) MRI brain and C-spine 3) I would favor obtaining MRI, and would only pursue stroke workup if this is positive for stroke. 4) patient  is allergic to aspirin, but is able to take Plavix, then I think this would be reasonable as long as CT is negative for bleed.   Ritta Slot, MD [facility] [phone]  If 7pm- 7am, please page neurology on call at [phone].

## 2012-12-05 LAB — BASIC METABOLIC PANEL
BUN: 9 mg/dL (ref 6–23)
Calcium: 9.1 mg/dL (ref 8.4–10.5)
GFR calc Af Amer: >90 mL/min (ref >90)
GFR calc non Af Amer: >90 mL/min (ref >90)
Potassium: 4.9 mEq/L (ref 3.5–5.1)
Sodium: 137 mEq/L (ref 135–145)

## 2012-12-05 LAB — CBC
Hemoglobin: 12 g/dL (ref 12.0–15.0)
MCHC: 33.2 g/dL (ref 30.0–36.0)
Platelets: 157 10*3/uL (ref 150–400)

## 2012-12-05 LAB — GLUCOSE, CAPILLARY: Glucose-Capillary: 213 mg/dL — ABNORMAL HIGH (ref 70–99)

## 2012-12-05 MED ORDER — ACETAMINOPHEN 500 MG PO TABS: 1000.0000 mg | ORAL_TABLET | Freq: Four times a day (QID) | ORAL | Status: DC | PRN

## 2012-12-05 MED ORDER — METHOCARBAMOL 500 MG PO TABS: 500.0000 mg | ORAL_TABLET | Freq: Four times a day (QID) | ORAL | Status: DC

## 2012-12-05 NOTE — Progress Notes (Signed)
Inpatient Diabetes Program Recommendations  AACE/ADA: New Consensus Statement on Inpatient Glycemic Control (2013)  Target Ranges:  Prepandial:   less than 140 mg/dL      Peak postprandial:   less than 180 mg/dL (1-2 hours)      Critically ill patients:  140 - 180 mg/dL     Results for [name], BURKMAN (MRN [id number]) as of 12/05/2012 10:16  Ref. Range 12/04/2012 07:36 12/04/2012 12:01 12/04/2012 16:59 12/04/2012 21:47  Glucose-Capillary Latest Range: 70-99 mg/dL 782 (H) 956 (H) 213 (H) 201 (H)    Results for [name], LOUTHAN (MRN [id number]) as of 12/05/2012 10:16  Ref. Range 12/05/2012 07:15  Glucose-Capillary Latest Range: 70-99 mg/dL 629 (H)    Results for [name], STJULIEN (MRN [id number]) as of 12/05/2012 10:16  Ref. Range 12/04/2012 06:02  Hemoglobin A1C Latest Range: <5.7 % 8.5 (H)    Patient admitted with CP, dizziness, and near syncope.  Has + history of Type 2 DM.    Per records, patient is taking the following DM medications at home: Metformin 500 mg daily Onglyza 5 mg daily  (as a combination pill)  **Noted per care management note that patient is having trouble affording her medication co-pays through her husband's health insurance.  According to patient, her husband's health insurance does not cover her medications well and she often gets samples through her PCP's office ([facility]).  **Based on patient's current A1c of 8.5%, patient does need better control at home.  We could switch patient to Metformin and Farxiga (dapagliflozin) as an outpatient.  Marcelline Deist is a SGLT-2 inhibitor that has been shown to lower A1c. Patient can get Metformin at [facility] for $4 and she can get [place] for free for 12 months (followed by free [place] for life as long as her PCP provides a renewed Rx for this medication every 12 months).  I can give patient a Rx savings card that will get her free [place].  Recommend the following Outpatient DM medication  adjustments with follow up with her PCP: 1. Stop Kombiglyze XR (Onglyza and Metformin combo pill) 2. Metformin 500  Mg bid with meals 3. Farxiga 5 mg daily in the morning   Will follow. Ambrose Finland RN, MSN, CDE Diabetes Coordinator Inpatient Diabetes Program [phone]

## 2012-12-05 NOTE — Progress Notes (Signed)
Subjective: The patient that she is still having some left-sided weakness and numbness although it is somewhat improved. We discussed the results of her MRI of the head and neck. The MRI of the head was normal. The MRI of the neck showed some mild degenerative changes with a small left paracentral disc protrusion at C4-C5. 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  Objective: Current vital signs: BP 143/81  Pulse 87  Temp(Src) 98 F (36.7 C) (Oral)  Resp 20  Ht 5\' 4"  (1.626 m)  Wt 110.269 kg (243 lb 1.6 oz)  BMI 41.71 kg/m2  SpO2 100% Vital signs in last 24 hours: Temp:  [98 F (36.7 C)-98.1 F (36.7 C)] 98 F (36.7 C) (06/27 0740) Pulse Rate:  [66-88] 87 (06/27 0740) Resp:  [18-20] 20 (06/27 0740) BP: (121-143)/(73-82) 143/81 mmHg (06/27 0740) SpO2:  [93 %-100 %] 100 % (06/27 0740)  Intake/Output from previous day:   Intake/Output this shift: Total I/O In: 360 [P.O.:360] Out: -  Nutritional status: Carb Control  Physical Exam  Neurologic Exam:  MENTAL STATUS: awake, alert, Language fluent Follows simple commands. Naming intact   CRANIAL NERVES: pupils equal and reactive to light, visual fields full to confrontation, extraocular muscles intact, no nystagmus, facial sensation and strength symmetric, uvula midline, shoulder shrug symmetric, tongue midline, Corneal reflex,  MOTOR: normal bulk and tone. Very mild weakness of the left extremities - 4+/5. No significant drift of the left upper extremity. SENSORY: normal and symmetric to light touch  COORDINATION: finger-nose-finger normal  REFLEXES: deep tendon reflexes present and symmetric  GAIT/STATION: Deferred   Lab Results: Basic Metabolic Panel:  Recent Labs Lab 12/03/12 1926 12/04/12 0602 12/05/12 0505  NA 136  --  137  K 3.8  --  4.9  CL 99  --  105  CO2 26  --  22  GLUCOSE 156*  --   234*  BUN 17  --  9  CREATININE 0.76 0.74 0.63  CALCIUM 9.9  --  9.1    Liver Function Tests: No results found for this basename: AST, ALT, ALKPHOS, BILITOT, PROT, ALBUMIN,  in the last 168 hours No results found for this basename: LIPASE, AMYLASE,  in the last 168 hours No results found for this basename: AMMONIA,  in the last 168 hours  CBC:  Recent Labs Lab 12/03/12 1926 12/04/12 0602 12/05/12 0505  WBC 10.5 6.4 5.4  HGB 13.4 11.7* 12.0  HCT 39.8 35.5* 36.1  MCV 85.0 85.7 86.0  PLT 216 167 157    Cardiac Enzymes:  Recent Labs Lab 12/03/12 1926 12/03/12 2210 12/04/12 0602  TROPONINI <0.30 <0.30 <0.30    Lipid Panel:  Recent Labs Lab 12/04/12 0602  CHOL 252*  TRIG 411*  HDL 31*  CHOLHDL 8.1  VLDL UNABLE TO CALCULATE IF TRIGLYCERIDE OVER 400 mg/dL  LDLCALC UNABLE TO CALCULATE IF TRIGLYCERIDE OVER 400 mg/dL    CBG:  Recent Labs Lab 12/04/12 0736 12/04/12 1201 12/04/12 1659 12/04/12 2147 12/05/12 0715  GLUCAP 204* 234* 239* 201* 238*    Microbiology: Results for orders placed during the hospital encounter of 03/09/10  CLOSTRIDIUM DIFFICILE EIA     Status: None   Collection Time    03/09/10  9:56 AM      Result Value Range Status   Specimen Description STOOL ENDO SPECIMEN   Final  Special Requests IMMUNE:NORM   Final   C difficile Toxins A+B, EIA NEGATIVE   Final   Report Status 03/10/2010 FINAL   Final  STOOL CULTURE     Status: None   Collection Time    03/09/10  9:56 AM      Result Value Range Status   Specimen Description STOOL ENDO SPECIMEN   Final   Special Requests IMMUNE:NORM   Final   Culture     Final   Value: NO SALMONELLA, SHIGELLA, CAMPYLOBACTER, OR YERSINIA ISOLATED   Report Status 03/13/2010 FINAL   Final  OVA AND PARASITE EXAMINATION     Status: None   Collection Time    03/09/10  9:56 AM      Result Value Range Status   Specimen Description STOOL ENDO SPECIMEN   Final   Special Requests NONE   Final   Ova and  parasites NO OVA OR PARASITES SEEN   Final   Report Status 03/13/2010 FINAL   Final  FECAL LACTOFERRIN     Status: None   Collection Time    03/09/10  9:56 AM      Result Value Range Status   Specimen Description STOOL ENDO SPECIMEN   Final   Special Requests NONE   Final   Fecal Lactoferrin NEGATIVE   Final   Report Status 03/10/2010 FINAL   Final    Coagulation Studies: No results found for this basename: LABPROT, INR,  in the last 72 hours  Imaging:  Dg Chest 2 View 12/03/2012  Low volume exam without acute chest process      Ct Head Wo Contrast 12/04/2012 No acute intracranial abnormality.    Mr Brain Wo Contrast 12/04/2012    Stable and normal noncontrast MRI appearance of the brain.Marland Kitchen    MRI CERVICAL SPINE   Overall very mild for age cervical spine degenerative changes. There is a small left paracentral disc protrusion at C4-C5 contributing to mild spinal stenosis and moderate bilateral C5 foraminal stenosis.  Mild if any spinal cord mass effect and no cord signal abnormality.     Medications:  Scheduled: . atorvastatin  40 mg Oral q1800  . buPROPion  450 mg Oral Daily  . enoxaparin (LOVENOX) injection  40 mg Subcutaneous Daily  . insulin aspart  0-5 Units Subcutaneous QHS  . insulin aspart  0-9 Units Subcutaneous TID WC  . levothyroxine  75 mcg Oral QAC breakfast  . sodium chloride  3 mL Intravenous Q12H    Assessment/Plan: MRI of the head was negative for stroke and was interpreted as normal. MRI of the neck revealed some mild degenerative changes as noted above.  Delton See PA-C [facility] Pager [phone] 12/05/2012, 8:32 AM   No indications per MRI study of acute stroke. MRI of cervical spine showed no indications of significant cord compression. Recommend physical and occupational therapy consultations and intervention regarding residual mild left side weakness. No further neurodiagnostic studies are indicated. We will continue to  follow this patient with you, however.  Venetia Maxon M.D.  [facility] [phone]

## 2012-12-05 NOTE — Progress Notes (Signed)
Subjective: Patient continues to complain of chest tightness  Worse with inspiration.  Notes some chronic cough with brown sputum. Objective: Filed Vitals:   12/04/12 1948 12/04/12 2348 12/05/12 0408 12/05/12 0740  BP: 132/81 143/82 121/75 143/81  Pulse: 88 74 66 87  Temp: 98 F (36.7 C) 98.1 F (36.7 C) 98 F (36.7 C) 98 F (36.7 C)  TempSrc: Oral Oral Oral Oral  Resp: 20 20 20 20   Height:      Weight:      SpO2: 100% 98% 97% 100%   Weight change:   Intake/Output Summary (Last 24 hours) at 12/05/12 1029 Last data filed at 12/05/12 0757  Gross per 24 hour  Intake    360 ml  Output      0 ml  Net    360 ml    General: Alert, awake, oriented x3, in no acute distress Neck:  JVP is normal Heart: Regular rate and rhythm, without murmurs, rubs, gallops.  Lungs: Clear to auscultation.  No rales or wheezes. Exemities:  No edema.   Neuro: Grossly intact, nonfocal.  Tele:  SR Lab Results: Results for orders placed during the hospital encounter of 12/03/12 (from the past 24 hour(s))  GLUCOSE, CAPILLARY     Status: Abnormal   Collection Time    12/04/12 12:01 PM      Result Value Range   Glucose-Capillary 234 (*) 70 - 99 mg/dL  GLUCOSE, CAPILLARY     Status: Abnormal   Collection Time    12/04/12  4:59 PM      Result Value Range   Glucose-Capillary 239 (*) 70 - 99 mg/dL  GLUCOSE, CAPILLARY     Status: Abnormal   Collection Time    12/04/12  9:47 PM      Result Value Range   Glucose-Capillary 201 (*) 70 - 99 mg/dL  BASIC METABOLIC PANEL     Status: Abnormal   Collection Time    12/05/12  5:05 AM      Result Value Range   Sodium 137  135 - 145 mEq/L   Potassium 4.9  3.5 - 5.1 mEq/L   Chloride 105  96 - 112 mEq/L   CO2 22  19 - 32 mEq/L   Glucose, Bld 234 (*) 70 - 99 mg/dL   BUN 9  6 - 23 mg/dL   Creatinine, Ser 0.98  0.50 - 1.10 mg/dL   Calcium 9.1  8.4 - 11.9 mg/dL   GFR calc non Af Amer >90  >90 mL/min   GFR calc Af Amer >90  >90 mL/min  CBC     Status: None   Collection Time    12/05/12  5:05 AM      Result Value Range   WBC 5.4  4.0 - 10.5 K/uL   RBC 4.20  3.87 - 5.11 MIL/uL   Hemoglobin 12.0  12.0 - 15.0 g/dL   HCT 14.7  82.9 - 56.2 %   MCV 86.0  78.0 - 100.0 fL   MCH 28.6  26.0 - 34.0 pg   MCHC 33.2  30.0 - 36.0 g/dL   RDW 13.0  86.5 - 78.4 %   Platelets 157  150 - 400 K/uL  GLUCOSE, CAPILLARY     Status: Abnormal   Collection Time    12/05/12  7:15 AM      Result Value Range   Glucose-Capillary 238 (*) 70 - 99 mg/dL   Comment 1 Notify RN      Studies/Results: @RISRSLT24 @  Medications: Reviewed   @PROBHOSP @  1.  CP  Atypical for cardiac ischemia.   Echo with normal LV systolic function.  Will arrange for outpatient f/u   Otherwise OK to d/c today.  Will contact patient.  LOS: 2 days   Dietrich Pates 12/05/2012, 10:29 AM

## 2012-12-05 NOTE — Discharge Summary (Signed)
Physician Discharge Summary  [name]:[id number] DOB: April 25, 1961 DOA: 12/03/2012  PCP: Rudi Heap, MD  Admit date: 12/03/2012 Discharge date: 12/05/2012  Time spent: 40 minutes  Recommendations for Outpatient Follow-up:  1. Followup with primary care physician in one week. 2. Please refer to outpatient physical therapy for cervical pain, left upper extremity weakness/tingling. 3. [facility] will contact the patient for outpatient followup.  Discharge Diagnoses:  Active Problems:   DIABETES MELLITUS, TYPE II   CHEST PAIN   Hypothyroidism   Hypertension   Left-sided weakness   Discharge Condition: Stable  Diet recommendation: Carbohydrate modified  Filed Weights   12/04/12 0645  Weight: 110.269 kg (243 lb 1.6 oz)    History of present illness:  [name] is a 52 y.o. female with h/o hypertension, DM, asthma, came in to ED after few episodes of chest pain, dizziness and near syncope, this afternoon. Her symptoms started at 1 pm, when she was driving, she felt dizziness, faint, And had precordial chest pain radiating to the right side, associated with left arm numbness , diaphoresis. she pulled off the road, her symptoms persisted for 15 minutes and slowly improved . She reports chest pressure did not completely resolve, still has chest pressure 5 to 6/10. No vomiting. Not on aspirin at home. She reports family h/o CAD in father at the age of 11. On arrival to ED, she was worked up for ACS , was found to have 2 neg troponins, and an EKG without any ST T wave abnormalities, because of her family history and risk factors, She is being admitted to medical service for evaluation of ACS and left sided weakness. She reported that she was seen by [facility] 5 years ago in [place].   Hospital Course:   1. Chest pain: Patient admitted to the hospital to rule out acute coronary syndrome, she had negative troponin and EKG at the time of admission,  D. dimers also was negative. Cardiology was consulted on 3 sets of cardiac enzymes were negative for ACS as well as 12-lead EKG. 2-D echocardiogram was negative for wall motion abnormalities, cardiology recommended outpatient stress test.  2. Left upper extremity numbness and tingling: Neurology was consulted as well, MRI of the brain did not show any acute abnormalities. MR of cervical spine showed small protrusion of C4-C5 disc without significant cervical stenosis. This is might potentially cause some tingling in pain along the left upper extremity, neurology recommended outpatient physical therapy. She will need outpatient PT on discharge.  3. DM 2: Hemoglobin A1c is 8.5%, in May it was 9.1 and prior to that in April it was 10.2%. Patient said she had problems before obtaining her medications because of higher co-pay, but she has no problems now. The diabetes coordinator recommended metformin and Judd Gaudier, the patient continues to have problems obtaining her medications. Metformin cost $4 in the [facility]. She can get the [place] free for 12 month and as long as her PCP renew the prescription she can get it free for life, no changes were done to her diabetes regimen on discharge.  4. Hypothyroidism: TSH was normal at 2.731 on 09/24/2012, her preadmission Synthroid continued throughout the hospital stay.  Procedures:  None  Consultations:  [facility].  Neurology service  Discharge Exam: Filed Vitals:   12/04/12 2348 12/05/12 0408 12/05/12 0740 12/05/12 1202  BP: 143/82 121/75 143/81 132/76  Pulse: 74 66 87 65  Temp: 98.1 F (36.7 C) 98 F (36.7 C) 98 F (36.7 C)  97.9 F (36.6 C)  TempSrc: Oral Oral Oral Oral  Resp: 20 20 20 18   Height:      Weight:      SpO2: 98% 97% 100% 99%   General: Alert and awake, oriented x3, not in any acute distress. HEENT: anicteric sclera, pupils reactive to light and accommodation, EOMI CVS: S1-S2 clear, no murmur rubs or gallops Chest: clear  to auscultation bilaterally, no wheezing, rales or rhonchi Abdomen: soft nontender, nondistended, normal bowel sounds, no organomegaly Extremities: no cyanosis, clubbing or edema noted bilaterally Neuro: Cranial nerves II-XII intact, no focal neurological deficits  Discharge Instructions  Discharge Orders   Future Orders Complete By Expires     Diet Carb Modified  As directed     Increase activity slowly  As directed         Medication List    TAKE these medications       acetaminophen 500 MG tablet  Commonly known as:  TYLENOL  Take 2 tablets (1,000 mg total) by mouth every 6 (six) hours as needed for pain.     benazepril 20 MG tablet  Commonly known as:  LOTENSIN  Take 1 tablet (20 mg total) by mouth daily.     buPROPion 150 MG 24 hr tablet  Commonly known as:  WELLBUTRIN XL  Take 450 mg by mouth daily.     levothyroxine 75 MCG tablet  Commonly known as:  LEVOTHROID  Take 1 tablet (75 mcg total) by mouth daily.     methocarbamol 500 MG tablet  Commonly known as:  ROBAXIN  Take 1 tablet (500 mg total) by mouth 4 (four) times daily.     nitroGLYCERIN 0.4 MG SL tablet  Commonly known as:  NITROSTAT  Place 0.4 mg under the tongue every 5 (five) minutes as needed.     propranolol 40 MG tablet  Commonly known as:  INDERAL  Take 1 tablet (40 mg total) by mouth 2 (two) times daily.     rosuvastatin 20 MG tablet  Commonly known as:  CRESTOR  Take 20 mg by mouth daily.     Saxagliptin-Metformin 5-500 MG Tb24  Commonly known as:  KOMBIGLYZE XR  Take 1 tablet by mouth daily.     topiramate 50 MG tablet  Commonly known as:  TOPAMAX  Take 1 tablet (50 mg total) by mouth 2 (two) times daily.       Allergies  Allergen Reactions  . Aspirin Anaphylaxis  . Bee Venom Anaphylaxis  . Benadryl (Diphenhydramine Hcl) Anaphylaxis  . Morphine And Related Shortness Of Breath and Itching  . Vicks Formula 44 Cough-Cold Pm (Dm-Apap-Cpm) Shortness Of Breath and Rash    Not  anaphylaxis      The results of significant diagnostics from this hospitalization (including imaging, microbiology, ancillary and laboratory) are listed below for reference.    Significant Diagnostic Studies: Dg Chest 2 View  12/03/2012   *RADIOLOGY REPORT*  Clinical Data: Nausea, chest pain, diaphoresis  CHEST - 2 VIEW  Comparison: 12/01/2007  Findings: Low lung volumes evident.  Normal heart size and vascularity.  Negative for pneumonia or CHF.  No effusion or pneumothorax.  Trachea midline.  IMPRESSION: Low volume exam without acute chest process   Original Report Authenticated By: Judie Petit. Miles Costain, M.D.   Ct Head Wo Contrast  12/04/2012   *RADIOLOGY REPORT*  Clinical Data: Left lower extremity pain.  CT HEAD WITHOUT CONTRAST  Technique:  Contiguous axial images were obtained from the base of the skull through  the vertex without contrast.  Comparison: 02/26/2011  Findings: No acute intracranial abnormality.  Specifically, no hemorrhage, hydrocephalus, mass lesion, acute infarction, or significant intracranial injury.  No acute calvarial abnormality. Visualized paranasal sinuses and mastoids clear.  Orbital soft tissues unremarkable.  IMPRESSION: No acute intracranial abnormality.   Original Report Authenticated By: Charlett Nose, M.D.   Mr Brain Wo Contrast  12/04/2012   *RADIOLOGY REPORT*  Clinical Data:  52 year old female with acute onset left-sided weakness and numbness.  MRI HEAD WITHOUT CONTRAST MRI CERVICAL SPINE WITHOUT CONTRAST  Technique:  Multiplanar, multiecho pulse sequences of the brain and surrounding structures, and cervical spine, to include the craniocervical junction and cervicothoracic junction, were obtained without intravenous contrast.  Comparison:  Head CT 12/04/2012.  Brain MRI 02/26/2011.  MRI HEAD  Findings:  Cerebral volume is stable and within normal limits. No restricted diffusion to suggest acute infarction.  No midline shift, mass effect, evidence of mass lesion,  ventriculomegaly, extra-axial collection or acute intracranial hemorrhage. Cervicomedullary junction and pituitary are within normal limits. Cervical spine findings are described below. Major intracranial vascular flow voids are preserved.  Stable and normal gray and white matter signal throughout the brain.  Trace right sphenoid sinus mucosal thickening is unchanged. Mastoids remain clear.  Grossly normal visualized internal auditory structures.  Negative scalp soft tissues. Visualized bone marrow signal is within normal limits.  IMPRESSION: 1.  Stable and normal noncontrast MRI appearance of the brain. 2.  Cervical spine findings are below.  MRI CERVICAL SPINE  Findings: Large body habitus.  Preserved cervical lordosis. No marrow edema or evidence of acute osseous abnormality.  Cervicomedullary junction is within normal limits.  No abnormal spinal cord signal.  Visualized paraspinal soft tissues are within normal limits.  C2-C3:  Negative.  C3-C4:  Small central disc protrusion.  No stenosis.  C4-C5:  Mild broad-based left paracentral disc protrusion. Mild ligament flavum hypertrophy.  Mild spinal stenosis.  Minimal to mild spinal cord mass effect.  Moderate bilateral C5 foraminal stenosis in part related to facet and uncovertebral hypertrophy.  C5-C6:  Minor disc bulge.  No stenosis.  C6-C7:  Minor disc bulge.  C7-T1:  Negative.  Negative visualized upper thoracic levels.  IMPRESSION: Overall very mild for age cervical spine degenerative changes. There is a small left paracentral disc protrusion at C4-C5 contributing to mild spinal stenosis and moderate bilateral C5 foraminal stenosis.  Mild if any spinal cord mass effect and no cord signal abnormality.   Original Report Authenticated By: Erskine Speed, M.D.   Mr Cervical Spine Wo Contrast  12/04/2012   *RADIOLOGY REPORT*  Clinical Data:  52 year old female with acute onset left-sided weakness and numbness.  MRI HEAD WITHOUT CONTRAST MRI CERVICAL SPINE WITHOUT  CONTRAST  Technique:  Multiplanar, multiecho pulse sequences of the brain and surrounding structures, and cervical spine, to include the craniocervical junction and cervicothoracic junction, were obtained without intravenous contrast.  Comparison:  Head CT 12/04/2012.  Brain MRI 02/26/2011.  MRI HEAD  Findings:  Cerebral volume is stable and within normal limits. No restricted diffusion to suggest acute infarction.  No midline shift, mass effect, evidence of mass lesion, ventriculomegaly, extra-axial collection or acute intracranial hemorrhage. Cervicomedullary junction and pituitary are within normal limits. Cervical spine findings are described below. Major intracranial vascular flow voids are preserved.  Stable and normal gray and white matter signal throughout the brain.  Trace right sphenoid sinus mucosal thickening is unchanged. Mastoids remain clear.  Grossly normal visualized internal auditory structures.  Negative  scalp soft tissues. Visualized bone marrow signal is within normal limits.  IMPRESSION: 1.  Stable and normal noncontrast MRI appearance of the brain. 2.  Cervical spine findings are below.  MRI CERVICAL SPINE  Findings: Large body habitus.  Preserved cervical lordosis. No marrow edema or evidence of acute osseous abnormality.  Cervicomedullary junction is within normal limits.  No abnormal spinal cord signal.  Visualized paraspinal soft tissues are within normal limits.  C2-C3:  Negative.  C3-C4:  Small central disc protrusion.  No stenosis.  C4-C5:  Mild broad-based left paracentral disc protrusion. Mild ligament flavum hypertrophy.  Mild spinal stenosis.  Minimal to mild spinal cord mass effect.  Moderate bilateral C5 foraminal stenosis in part related to facet and uncovertebral hypertrophy.  C5-C6:  Minor disc bulge.  No stenosis.  C6-C7:  Minor disc bulge.  C7-T1:  Negative.  Negative visualized upper thoracic levels.  IMPRESSION: Overall very mild for age cervical spine degenerative changes.  There is a small left paracentral disc protrusion at C4-C5 contributing to mild spinal stenosis and moderate bilateral C5 foraminal stenosis.  Mild if any spinal cord mass effect and no cord signal abnormality.   Original Report Authenticated By: Erskine Speed, M.D.    Microbiology: No results found for this or any previous visit (from the past 240 hour(s)).   Labs: Basic Metabolic Panel:  Recent Labs Lab 12/03/12 1926 12/04/12 0602 12/05/12 0505  NA 136  --  137  K 3.8  --  4.9  CL 99  --  105  CO2 26  --  22  GLUCOSE 156*  --  234*  BUN 17  --  9  CREATININE 0.76 0.74 0.63  CALCIUM 9.9  --  9.1   Liver Function Tests: No results found for this basename: AST, ALT, ALKPHOS, BILITOT, PROT, ALBUMIN,  in the last 168 hours No results found for this basename: LIPASE, AMYLASE,  in the last 168 hours No results found for this basename: AMMONIA,  in the last 168 hours CBC:  Recent Labs Lab 12/03/12 1926 12/04/12 0602 12/05/12 0505  WBC 10.5 6.4 5.4  HGB 13.4 11.7* 12.0  HCT 39.8 35.5* 36.1  MCV 85.0 85.7 86.0  PLT 216 167 157   Cardiac Enzymes:  Recent Labs Lab 12/03/12 1926 12/03/12 2210 12/04/12 0602  TROPONINI <0.30 <0.30 <0.30   BNP: BNP (last 3 results) No results found for this basename: PROBNP,  in the last 8760 hours CBG:  Recent Labs Lab 12/04/12 1201 12/04/12 1659 12/04/12 2147 12/05/12 0715 12/05/12 1125  GLUCAP 234* 239* 201* 238* 213*       Signed:  Weslee Prestage A  [facility] 12/05/2012, 2:27 PM

## 2012-12-05 NOTE — Progress Notes (Signed)
Reviewed discharge instructions with patient and she stated her understanding.  Patient discharged home with husband without complaints.  Colman Cater

## 2012-12-11 ENCOUNTER — Encounter: Payer: 59 | Admitting: Internal Medicine

## 2012-12-24 ENCOUNTER — Telehealth: Payer: Self-pay | Admitting: Nurse Practitioner

## 2012-12-25 NOTE — Telephone Encounter (Signed)
Samples up front. Patient notified 

## 2013-03-12 ENCOUNTER — Telehealth: Payer: Self-pay | Admitting: Nurse Practitioner

## 2013-03-16 ENCOUNTER — Other Ambulatory Visit: Payer: Self-pay

## 2013-03-16 DIAGNOSIS — E039 Hypothyroidism, unspecified: Secondary | ICD-10-CM

## 2013-03-16 DIAGNOSIS — G43901 Migraine, unspecified, not intractable, with status migrainosus: Secondary | ICD-10-CM

## 2013-03-16 MED ORDER — LEVOTHYROXINE SODIUM 75 MCG PO TABS: 75.0000 ug | ORAL_TABLET | Freq: Every day | ORAL | Status: DC

## 2013-03-16 MED ORDER — PROPRANOLOL HCL 40 MG PO TABS: 40.0000 mg | ORAL_TABLET | Freq: Two times a day (BID) | ORAL | Status: DC

## 2013-03-16 NOTE — Telephone Encounter (Signed)
UP FRONT

## 2013-03-20 ENCOUNTER — Other Ambulatory Visit: Payer: Self-pay | Admitting: *Deleted

## 2013-03-20 DIAGNOSIS — G43901 Migraine, unspecified, not intractable, with status migrainosus: Secondary | ICD-10-CM

## 2013-03-20 DIAGNOSIS — E039 Hypothyroidism, unspecified: Secondary | ICD-10-CM

## 2013-03-20 MED ORDER — PROPRANOLOL HCL 40 MG PO TABS: 40.0000 mg | ORAL_TABLET | Freq: Two times a day (BID) | ORAL | Status: DC

## 2013-03-20 MED ORDER — LEVOTHYROXINE SODIUM 75 MCG PO TABS: 75.0000 ug | ORAL_TABLET | Freq: Every day | ORAL | Status: DC

## 2013-03-23 ENCOUNTER — Other Ambulatory Visit: Payer: Self-pay

## 2013-03-23 MED ORDER — BUPROPION HCL ER (XL) 150 MG PO TB24: 450.0000 mg | ORAL_TABLET | Freq: Every day | ORAL | Status: DC

## 2013-03-23 NOTE — Telephone Encounter (Signed)
Last seen 10/24/12 MMM

## 2013-03-23 NOTE — Telephone Encounter (Signed)
Last seen 10/24/12

## 2013-04-16 ENCOUNTER — Other Ambulatory Visit: Payer: Self-pay

## 2013-04-17 ENCOUNTER — Other Ambulatory Visit: Payer: Self-pay

## 2013-04-17 DIAGNOSIS — G43901 Migraine, unspecified, not intractable, with status migrainosus: Secondary | ICD-10-CM

## 2013-04-17 DIAGNOSIS — E039 Hypothyroidism, unspecified: Secondary | ICD-10-CM

## 2013-04-17 MED ORDER — LEVOTHYROXINE SODIUM 75 MCG PO TABS: 75.0000 ug | ORAL_TABLET | Freq: Every day | ORAL | Status: DC

## 2013-04-17 MED ORDER — PROPRANOLOL HCL 40 MG PO TABS: 40.0000 mg | ORAL_TABLET | Freq: Two times a day (BID) | ORAL | Status: DC

## 2013-04-17 MED ORDER — TOPIRAMATE 50 MG PO TABS: 50.0000 mg | ORAL_TABLET | Freq: Two times a day (BID) | ORAL | Status: DC

## 2013-04-17 NOTE — Telephone Encounter (Signed)
Las s een 10/24/12  MMM   LAST THYROID 09/24/12

## 2013-05-28 ENCOUNTER — Telehealth: Payer: Self-pay | Admitting: Nurse Practitioner

## 2013-05-28 DIAGNOSIS — G43901 Migraine, unspecified, not intractable, with status migrainosus: Secondary | ICD-10-CM

## 2013-05-28 DIAGNOSIS — E039 Hypothyroidism, unspecified: Secondary | ICD-10-CM

## 2013-05-28 MED ORDER — TOPIRAMATE 50 MG PO TABS: 50.0000 mg | ORAL_TABLET | Freq: Two times a day (BID) | ORAL | Status: DC

## 2013-05-28 MED ORDER — PROPRANOLOL HCL 40 MG PO TABS: 40.0000 mg | ORAL_TABLET | Freq: Two times a day (BID) | ORAL | Status: DC

## 2013-05-28 MED ORDER — BUPROPION HCL ER (XL) 150 MG PO TB24: 450.0000 mg | ORAL_TABLET | Freq: Every day | ORAL | Status: DC

## 2013-05-28 MED ORDER — LEVOTHYROXINE SODIUM 75 MCG PO TABS: 75.0000 ug | ORAL_TABLET | Freq: Every day | ORAL | Status: DC

## 2013-05-28 NOTE — Telephone Encounter (Signed)
Ok for samples?

## 2013-05-29 NOTE — Telephone Encounter (Signed)
Patient aware.

## 2013-06-01 ENCOUNTER — Other Ambulatory Visit: Payer: Self-pay

## 2013-06-01 DIAGNOSIS — G43901 Migraine, unspecified, not intractable, with status migrainosus: Secondary | ICD-10-CM

## 2013-06-01 DIAGNOSIS — I10 Essential (primary) hypertension: Secondary | ICD-10-CM

## 2013-06-01 MED ORDER — BUPROPION HCL ER (XL) 150 MG PO TB24: 450.0000 mg | ORAL_TABLET | Freq: Every day | ORAL | Status: DC

## 2013-06-01 MED ORDER — PROPRANOLOL HCL 40 MG PO TABS: 40.0000 mg | ORAL_TABLET | Freq: Two times a day (BID) | ORAL | Status: DC

## 2013-06-01 MED ORDER — TOPIRAMATE 50 MG PO TABS: 50.0000 mg | ORAL_TABLET | Freq: Two times a day (BID) | ORAL | Status: DC

## 2013-06-01 MED ORDER — BENAZEPRIL HCL 20 MG PO TABS: 20.0000 mg | ORAL_TABLET | Freq: Every day | ORAL | Status: DC

## 2013-06-01 NOTE — Telephone Encounter (Signed)
Last seen 10/23/12  MMM 

## 2013-06-15 ENCOUNTER — Other Ambulatory Visit: Payer: Self-pay

## 2013-06-15 DIAGNOSIS — G43901 Migraine, unspecified, not intractable, with status migrainosus: Secondary | ICD-10-CM

## 2013-06-15 MED ORDER — PROPRANOLOL HCL 40 MG PO TABS: 40.0000 mg | ORAL_TABLET | Freq: Two times a day (BID) | ORAL | Status: DC

## 2013-06-25 ENCOUNTER — Ambulatory Visit (INDEPENDENT_AMBULATORY_CARE_PROVIDER_SITE_OTHER): Payer: BC Managed Care – PPO | Admitting: Nurse Practitioner

## 2013-06-25 ENCOUNTER — Encounter: Payer: Self-pay | Admitting: Nurse Practitioner

## 2013-06-25 VITALS — BP 148/78 | HR 72 | Temp 98.5°F | Ht 64.0 in | Wt 241.0 lb

## 2013-06-25 DIAGNOSIS — Z23 Encounter for immunization: Secondary | ICD-10-CM

## 2013-06-25 DIAGNOSIS — F411 Generalized anxiety disorder: Secondary | ICD-10-CM

## 2013-06-25 DIAGNOSIS — R7309 Other abnormal glucose: Secondary | ICD-10-CM

## 2013-06-25 DIAGNOSIS — E785 Hyperlipidemia, unspecified: Secondary | ICD-10-CM

## 2013-06-25 DIAGNOSIS — I10 Essential (primary) hypertension: Secondary | ICD-10-CM

## 2013-06-25 DIAGNOSIS — Z87898 Personal history of other specified conditions: Secondary | ICD-10-CM

## 2013-06-25 DIAGNOSIS — E119 Type 2 diabetes mellitus without complications: Secondary | ICD-10-CM

## 2013-06-25 DIAGNOSIS — E039 Hypothyroidism, unspecified: Secondary | ICD-10-CM

## 2013-06-25 DIAGNOSIS — G43901 Migraine, unspecified, not intractable, with status migrainosus: Secondary | ICD-10-CM

## 2013-06-25 LAB — POCT UA - MICROALBUMIN: Microalbumin Ur, POC: 50 mg/L

## 2013-06-25 LAB — GLUCOSE, POCT (MANUAL RESULT ENTRY): POC Glucose: 273 mg/dl — AB (ref 70–99)

## 2013-06-25 LAB — POCT GLYCOSYLATED HEMOGLOBIN (HGB A1C)

## 2013-06-25 MED ORDER — PROPRANOLOL HCL 40 MG PO TABS: 40.0000 mg | ORAL_TABLET | Freq: Two times a day (BID) | ORAL | Status: DC

## 2013-06-25 MED ORDER — TOPIRAMATE 50 MG PO TABS: 50.0000 mg | ORAL_TABLET | Freq: Two times a day (BID) | ORAL | Status: DC

## 2013-06-25 MED ORDER — BACLOFEN 10 MG PO TABS: ORAL_TABLET | ORAL | Status: DC

## 2013-06-25 MED ORDER — BUPROPION HCL ER (XL) 150 MG PO TB24: 450.0000 mg | ORAL_TABLET | Freq: Every day | ORAL | Status: DC

## 2013-06-25 MED ORDER — BENAZEPRIL HCL 20 MG PO TABS: 20.0000 mg | ORAL_TABLET | Freq: Every day | ORAL | Status: DC

## 2013-06-25 MED ORDER — SAXAGLIPTIN-METFORMIN ER 5-1000 MG PO TB24: 1.0000 | ORAL_TABLET | Freq: Every day | ORAL | Status: DC

## 2013-06-25 NOTE — Patient Instructions (Signed)
Diabetes and Foot Care Diabetes may cause you to have problems because of poor blood supply (circulation) to your feet and legs. This may cause the skin on your feet to become thinner, break easier, and heal more slowly. Your skin may become dry, and the skin may peel and crack. You may also have nerve damage in your legs and feet causing decreased feeling in them. You may not notice minor injuries to your feet that could lead to infections or more serious problems. Taking care of your feet is one of the most important things you can do for yourself.  HOME CARE INSTRUCTIONS  Wear shoes at all times, even in the house. Do not go barefoot. Bare feet are easily injured.  Check your feet daily for blisters, cuts, and redness. If you cannot see the bottom of your feet, use a mirror or ask someone for help.  Wash your feet with warm water (do not use hot water) and mild soap. Then pat your feet and the areas between your toes until they are completely dry. Do not soak your feet as this can dry your skin.  Apply a moisturizing lotion or petroleum jelly (that does not contain alcohol and is unscented) to the skin on your feet and to dry, brittle toenails. Do not apply lotion between your toes.  Trim your toenails straight across. Do not dig under them or around the cuticle. File the edges of your nails with an emery board or nail file.  Do not cut corns or calluses or try to remove them with medicine.  Wear clean socks or stockings every day. Make sure they are not too tight. Do not wear knee-high stockings since they may decrease blood flow to your legs.  Wear shoes that fit properly and have enough cushioning. To break in new shoes, wear them for just a few hours a day. This prevents you from injuring your feet. Always look in your shoes before you put them on to be sure there are no objects inside.  Do not cross your legs. This may decrease the blood flow to your feet.  If you find a minor scrape,  cut, or break in the skin on your feet, keep it and the skin around it clean and dry. These areas may be cleansed with mild soap and water. Do not cleanse the area with peroxide, alcohol, or iodine.  When you remove an adhesive bandage, be sure not to damage the skin around it.  If you have a wound, look at it several times a day to make sure it is healing.  Do not use heating pads or hot water bottles. They may burn your skin. If you have lost feeling in your feet or legs, you may not know it is happening until it is too late.  Make sure your health care provider performs a complete foot exam at least annually or more often if you have foot problems. Report any cuts, sores, or bruises to your health care provider immediately. SEEK MEDICAL CARE IF:   You have an injury that is not healing.  You have cuts or breaks in the skin.  You have an ingrown nail.  You notice redness on your legs or feet.  You feel burning or tingling in your legs or feet.  You have pain or cramps in your legs and feet.  Your legs or feet are numb.  Your feet always feel cold. SEEK IMMEDIATE MEDICAL CARE IF:   There is increasing redness,   swelling, or pain in or around a wound.  There is a red line that goes up your leg.  Pus is coming from a wound.  You develop a fever or as directed by your health care provider.  You notice a bad smell coming from an ulcer or wound. Document Released: 05/25/2000 Document Revised: 01/28/2013 Document Reviewed: 11/04/2012 [employer] Patient Information 2014 [employer].  

## 2013-06-25 NOTE — Progress Notes (Signed)
Subjective:    Patient ID: [name], female    DOB: [birth year], 53 y.o.   MRN: [id number]  Patient here today for follow up- she is doing quits well right now. No complaints.  Hyperlipidemia This is a chronic problem. The current episode started more than 1 year ago. The problem is uncontrolled. Exacerbating diseases include diabetes and hypothyroidism. Associated symptoms include myalgias. Pertinent negatives include no chest pain or shortness of breath. She is currently on no antihyperlipidemic treatment. The current treatment provides no improvement of lipids. Compliance problems include medication side effects.  Risk factors for coronary artery disease include post-menopausal, obesity, dyslipidemia and family history.  Migraine  This is a chronic (Patient goes th headache clinic and was recently started on baclofen 10 1-2 prn headache. Patient says it really works but sh ecannot afford to go to headache clinic right now and would like refill on meds.) problem. The current episode started more than 1 year ago. The problem occurs monthly. The problem has been gradually improving. The pain is located in the left unilateral and occipital region. The pain does not radiate. The pain quality is similar to prior headaches. The quality of the pain is described as stabbing. The pain is at a severity of 10/10. The pain is moderate. Pertinent negatives include no blurred vision, dizziness, numbness, seizures, visual change or weight loss. The treatment provided moderate relief. There is no history of cancer or hypertension.  Hypertension This is a chronic problem. The current episode started more than 1 year ago. The problem has been resolved since onset. The problem is controlled. Associated symptoms include anxiety. Pertinent negatives include no blurred vision, chest pain, palpitations, peripheral edema or shortness of breath. There are no associated agents to hypertension. Risk factors for  coronary artery disease include diabetes mellitus, dyslipidemia, obesity, post-menopausal state, family history and stress. Past treatments include ACE inhibitors and beta blockers. The current treatment provides moderate improvement. Compliance problems include diet.  Hypertensive end-organ damage includes a thyroid problem. There is no history of heart failure.  Diabetes She presents for her follow-up diabetic visit. She has type 2 diabetes mellitus. No MedicAlert identification noted. Her disease course has been stable. Pertinent negatives for hypoglycemia include no dizziness or seizures. Associated symptoms include fatigue. Pertinent negatives for diabetes include no blurred vision, no chest pain, no foot ulcerations, no visual change and no weight loss. Pertinent negatives for hypoglycemia complications include no blackouts and no hospitalization. Symptoms are stable. Diabetic complications include peripheral neuropathy (both hands). (Pt states the numbness in hands is from "carpal tunnel") Risk factors for coronary artery disease include hypertension, obesity, dyslipidemia, diabetes mellitus and post-menopausal. Current diabetic treatment includes diet (patient has been out of her meds due to no money.). She is compliant with treatment none of the time. When asked about meal planning, she reported none. She participates in exercise weekly. Her breakfast blood glucose is taken between 9-10 am. Her breakfast blood glucose range is generally 130-140 mg/dl. She does not see a podiatrist.Eye exam is current (April 2013).  Thyroid Problem Presents for follow-up (hypothyroidism) visit. Symptoms include fatigue. Patient reports no depressed mood, diaphoresis, diarrhea, dry skin, hoarse voice, menstrual problem, palpitations, visual change or weight loss. The symptoms have been stable. Her past medical history is significant for diabetes and hyperlipidemia. There is no history of heart failure.    Review of  Systems  Constitutional: Positive for fatigue. Negative for weight loss and diaphoresis.  HENT: Negative for hoarse voice.  Eyes: Negative for blurred vision.  Respiratory: Negative for shortness of breath.   Cardiovascular: Negative for chest pain and palpitations.  Gastrointestinal: Negative for diarrhea.  Genitourinary: Negative for menstrual problem.  Musculoskeletal: Positive for myalgias.  Neurological: Negative for dizziness, seizures and numbness.  All other systems reviewed and are negative.       Objective:   Physical Exam  Constitutional: She is oriented to person, place, and time. She appears well-developed and well-nourished.  HENT:  Nose: Nose normal.  Mouth/Throat: Oropharynx is clear and moist.  Eyes: EOM are normal.  Neck: Trachea normal, normal range of motion and full passive range of motion without pain. Neck supple. No JVD present. Carotid bruit is not present. No thyromegaly present.  Cardiovascular: Normal rate, regular rhythm, normal heart sounds and intact distal pulses.  Exam reveals no gallop and no friction rub.   No murmur heard. Pulmonary/Chest: Effort normal and breath sounds normal.  Abdominal: Soft. Bowel sounds are normal. She exhibits no distension and no mass. There is no tenderness.  Musculoskeletal: Normal range of motion.  Lymphadenopathy:    She has no cervical adenopathy.  Neurological: She is alert and oriented to person, place, and time. She has normal reflexes.  +4/4 monofilament bilaterally   Skin: Skin is warm and dry.  Psychiatric: She has a normal mood and affect. Her behavior is normal. Judgment and thought content normal.    BP 148/78  Pulse 72  Temp(Src) 98.5 F (36.9 C) (Oral)  Ht _0  (1.626 m)  Wt 241 lb (109.317 kg)  BMI 41.35 kg/m2  Results for orders placed in visit on 06/25/13  POCT GLYCOSYLATED HEMOGLOBIN (HGB A1C)      Result Value Range   Hemoglobin A1C 10.7%    GLUCOSE, POCT (MANUAL RESULT ENTRY)       Result Value Range   POC Glucose 273 (*) 70 - 99 mg/dl        Assessment & Plan:   1. DIABETES MELLITUS, TYPE II   2. Hyperlipidemia   3. Hypertension   4. Hypothyroidism   5. MIGRAINES, HX OF   6. ANXIETY   7. Other abnormal glucose   8. Need for Tdap vaccination   9. Essential hypertension, benign   10. Migraine with status migrainosus    Orders Placed This Encounter  Procedures  . Tdap vaccine greater than or equal to 7yo IM  . CMP14+EGFR  . NMR, lipoprofile  . Thyroid Panel With TSH  . POCT glycosylated hemoglobin (Hb A1C)  . POCT glucose (manual entry)  . POCT UA - Microalbumin   Meds ordered this encounter  Medications  . DISCONTD: baclofen (LIORESAL) 10 MG tablet    Sig: Take 10 mg by mouth 3 (three) times daily.  Marland Kitchen DISCONTD: baclofen (LIORESAL) 10 MG tablet    Sig: Take 10 mg by mouth 3 (three) times daily.  . Saxagliptin-Metformin (KOMBIGLYZE XR) 10-998 MG TB24    Sig: Take 1 tablet by mouth daily.    Dispense:  60 tablet    Refill:  0    Order Specific Question:  Supervising Provider    Answer:  Chipper Herb [[id number]]  . baclofen (LIORESAL) 10 MG tablet    Sig: 1 tablet up to TID prn- limit dose to 1-2 days a week    Dispense:  30 each    Refill:  1    Order Specific Question:  Supervising Provider    Answer:  Chipper Herb [[id number]]  . benazepril (  LOTENSIN) 20 MG tablet    Sig: Take 1 tablet (20 mg total) by mouth daily.    Dispense:  30 tablet    Refill:  0    Order Specific Question:  Supervising Provider    Answer:  Chipper Herb [[id number]]  . propranolol (INDERAL) 40 MG tablet    Sig: Take 1 tablet (40 mg total) by mouth 2 (two) times daily.    Dispense:  30 tablet    Refill:  0    Order Specific Question:  Supervising Provider    Answer:  Chipper Herb [[id number]]  . buPROPion (WELLBUTRIN XL) 150 MG 24 hr tablet    Sig: Take 3 tablets (450 mg total) by mouth daily.    Dispense:  90 tablet    Refill:  0    Order Specific Question:  Supervising  Provider    Answer:  Chipper Herb [[id number]]  . topiramate (TOPAMAX) 50 MG tablet    Sig: Take 1 tablet (50 mg total) by mouth 2 (two) times daily.    Dispense:  60 tablet    Refill:  0    NTBS    Order Specific Question:  Supervising Provider    Answer:  Chipper Herb [[id number]]  Given hemoccult cards Increased kombiglyze to 10/998 daily Strict low carb diet Stress menagement discussed Get back on crestor as rx Flu and td shot today Health maintenance reviewed Diet and exercise encouraged Continue all meds Follow up  In 3 month- patient will schedule mammogram and PAP    Mary-Margaret Hassell Done, FNP

## 2013-06-26 LAB — MICROALBUMIN, URINE: MICROALBUM., U, RANDOM: 284 ug/mL — AB (ref 0.0–17.0)

## 2013-06-27 LAB — CMP14+EGFR
ALBUMIN: 4.4 g/dL (ref 3.5–5.5)
ALT: 54 IU/L — ABNORMAL HIGH (ref 0–32)
AST: 25 IU/L (ref 0–40)
Albumin/Globulin Ratio: 2.1 (ref 1.1–2.5)
Alkaline Phosphatase: 97 IU/L (ref 39–117)
BUN/Creatinine Ratio: 14 (ref 9–23)
BUN: 7 mg/dL (ref 6–24)
CO2: 19 mmol/L (ref 18–29)
CREATININE: 0.51 mg/dL — AB (ref 0.57–1.00)
Calcium: 9.4 mg/dL (ref 8.7–10.2)
Chloride: 97 mmol/L (ref 97–108)
GFR calc Af Amer: 128 mL/min/{1.73_m2} (ref >59)
GFR calc non Af Amer: 111 mL/min/{1.73_m2} (ref >59)
GLOBULIN, TOTAL: 2.1 g/dL (ref 1.5–4.5)
Glucose: 303 mg/dL — ABNORMAL HIGH (ref 65–99)
Potassium: 4.3 mmol/L (ref 3.5–5.2)
Sodium: 137 mmol/L (ref 134–144)
Total Bilirubin: <0.2 mg/dL (ref 0.0–1.2)
Total Protein: 6.5 g/dL (ref 6.0–8.5)

## 2013-06-27 LAB — NMR, LIPOPROFILE
CHOLESTEROL: 391 mg/dL — AB (ref <200)
HDL Cholesterol by NMR: 37 mg/dL — ABNORMAL LOW (ref >=40)
HDL PARTICLE NUMBER: 25.6 umol/L — AB (ref >=30.5)
LDL Particle Number: >3500 nmol/L — ABNORMAL HIGH (ref <1000)
LDL Size: 19.6 nm — ABNORMAL LOW (ref >20.5)
LP-IR Score: 100 — ABNORMAL HIGH (ref <=45)
Small LDL Particle Number: >2300 nmol/L — ABNORMAL HIGH (ref <=527)
Triglycerides by NMR: 1206 mg/dL — ABNORMAL HIGH (ref <150)

## 2013-06-27 LAB — THYROID PANEL WITH TSH
FREE THYROXINE INDEX: 2.3 (ref 1.2–4.9)
T3 UPTAKE RATIO: 27 % (ref 24–39)
T4, Total: 8.5 ug/dL (ref 4.5–12.0)
TSH: 3.71 u[IU]/mL (ref 0.450–4.500)

## 2013-08-24 ENCOUNTER — Other Ambulatory Visit: Payer: Self-pay | Admitting: *Deleted

## 2013-08-24 DIAGNOSIS — I10 Essential (primary) hypertension: Secondary | ICD-10-CM

## 2013-08-24 DIAGNOSIS — G43901 Migraine, unspecified, not intractable, with status migrainosus: Secondary | ICD-10-CM

## 2013-08-24 MED ORDER — TOPIRAMATE 50 MG PO TABS: 50.0000 mg | ORAL_TABLET | Freq: Two times a day (BID) | ORAL | Status: DC

## 2013-08-24 MED ORDER — BUPROPION HCL ER (XL) 150 MG PO TB24: 450.0000 mg | ORAL_TABLET | Freq: Every day | ORAL | Status: DC

## 2013-08-24 MED ORDER — BENAZEPRIL HCL 20 MG PO TABS: 20.0000 mg | ORAL_TABLET | Freq: Every day | ORAL | Status: DC

## 2013-08-24 MED ORDER — PROPRANOLOL HCL 40 MG PO TABS: 40.0000 mg | ORAL_TABLET | Freq: Two times a day (BID) | ORAL | Status: DC

## 2013-08-26 ENCOUNTER — Telehealth: Payer: Self-pay | Admitting: Nurse Practitioner

## 2013-08-26 DIAGNOSIS — G43901 Migraine, unspecified, not intractable, with status migrainosus: Secondary | ICD-10-CM

## 2013-08-26 DIAGNOSIS — I10 Essential (primary) hypertension: Secondary | ICD-10-CM

## 2013-08-26 DIAGNOSIS — E039 Hypothyroidism, unspecified: Secondary | ICD-10-CM

## 2013-08-26 MED ORDER — TOPIRAMATE 50 MG PO TABS: 50.0000 mg | ORAL_TABLET | Freq: Two times a day (BID) | ORAL | Status: DC

## 2013-08-26 MED ORDER — BUPROPION HCL ER (XL) 150 MG PO TB24: 450.0000 mg | ORAL_TABLET | Freq: Every day | ORAL | Status: DC

## 2013-08-26 MED ORDER — LEVOTHYROXINE SODIUM 75 MCG PO TABS: 75.0000 ug | ORAL_TABLET | Freq: Every day | ORAL | Status: DC

## 2013-08-26 MED ORDER — BENAZEPRIL HCL 20 MG PO TABS: 20.0000 mg | ORAL_TABLET | Freq: Every day | ORAL | Status: DC

## 2013-08-26 MED ORDER — PROPRANOLOL HCL 40 MG PO TABS: 40.0000 mg | ORAL_TABLET | Freq: Two times a day (BID) | ORAL | Status: DC

## 2013-08-26 NOTE — Telephone Encounter (Signed)
rx sent to pharmacy

## 2013-08-26 NOTE — Telephone Encounter (Signed)
Needs her thyroid refilled

## 2013-08-27 NOTE — Telephone Encounter (Signed)
Scripts were sent in, patient aware.

## 2013-09-28 LAB — HM DIABETES EYE EXAM

## 2013-09-29 ENCOUNTER — Encounter: Payer: Self-pay | Admitting: Nurse Practitioner

## 2013-09-29 ENCOUNTER — Ambulatory Visit (INDEPENDENT_AMBULATORY_CARE_PROVIDER_SITE_OTHER): Payer: BC Managed Care – PPO | Admitting: Nurse Practitioner

## 2013-09-29 VITALS — BP 144/75 | HR 71 | Temp 98.3°F | Ht 64.0 in | Wt 239.0 lb

## 2013-09-29 DIAGNOSIS — E785 Hyperlipidemia, unspecified: Secondary | ICD-10-CM

## 2013-09-29 DIAGNOSIS — G43901 Migraine, unspecified, not intractable, with status migrainosus: Secondary | ICD-10-CM

## 2013-09-29 DIAGNOSIS — F329 Major depressive disorder, single episode, unspecified: Secondary | ICD-10-CM

## 2013-09-29 DIAGNOSIS — J45909 Unspecified asthma, uncomplicated: Secondary | ICD-10-CM

## 2013-09-29 DIAGNOSIS — F411 Generalized anxiety disorder: Secondary | ICD-10-CM

## 2013-09-29 DIAGNOSIS — E119 Type 2 diabetes mellitus without complications: Secondary | ICD-10-CM

## 2013-09-29 DIAGNOSIS — E039 Hypothyroidism, unspecified: Secondary | ICD-10-CM

## 2013-09-29 DIAGNOSIS — Z0000 Encounter for general adult medical examination without abnormal findings: Secondary | ICD-10-CM

## 2013-09-29 DIAGNOSIS — Z01419 Encounter for gynecological examination (general) (routine) without abnormal findings: Secondary | ICD-10-CM

## 2013-09-29 DIAGNOSIS — Z124 Encounter for screening for malignant neoplasm of cervix: Secondary | ICD-10-CM

## 2013-09-29 DIAGNOSIS — I10 Essential (primary) hypertension: Secondary | ICD-10-CM

## 2013-09-29 DIAGNOSIS — F3289 Other specified depressive episodes: Secondary | ICD-10-CM

## 2013-09-29 DIAGNOSIS — Z713 Dietary counseling and surveillance: Secondary | ICD-10-CM

## 2013-09-29 LAB — POCT CBC
GRANULOCYTE PERCENT: 63.7 % (ref 37–80)
HEMATOCRIT: 38.7 % (ref 37.7–47.9)
Hemoglobin: 12.7 g/dL (ref 12.2–16.2)
Lymph, poc: 2.4 (ref 0.6–3.4)
MCH: 27.5 pg (ref 27–31.2)
MCHC: 32.9 g/dL (ref 31.8–35.4)
MCV: 83.4 fL (ref 80–97)
MPV: 8.4 fL (ref 0–99.8)
PLATELET COUNT, POC: 176 10*3/uL (ref 142–424)
POC Granulocyte: 4.5 (ref 2–6.9)
POC LYMPH PERCENT: 33.3 %L (ref 10–50)
RBC: 4.6 M/uL (ref 4.04–5.48)
RDW, POC: 13.6 %
WBC: 7.1 10*3/uL (ref 4.6–10.2)

## 2013-09-29 LAB — POCT URINALYSIS DIPSTICK
BILIRUBIN UA: NEGATIVE
GLUCOSE UA: NEGATIVE
KETONES UA: NEGATIVE
NITRITE UA: NEGATIVE
PH UA: 7.5
Protein, UA: NEGATIVE
Spec Grav, UA: <=1.005
Urobilinogen, UA: NEGATIVE

## 2013-09-29 LAB — POCT UA - MICROSCOPIC ONLY
CASTS, UR, LPF, POC: NEGATIVE
Crystals, Ur, HPF, POC: NEGATIVE
MUCUS UA: NEGATIVE
Yeast, UA: NEGATIVE

## 2013-09-29 LAB — HM DIABETES FOOT EXAM: HM DIABETIC FOOT EXAM: NORMAL

## 2013-09-29 LAB — POCT GLYCOSYLATED HEMOGLOBIN (HGB A1C): Hemoglobin A1C: 9.9

## 2013-09-29 MED ORDER — LEVOTHYROXINE SODIUM 75 MCG PO TABS: 75.0000 ug | ORAL_TABLET | Freq: Every day | ORAL | Status: DC

## 2013-09-29 MED ORDER — BENAZEPRIL HCL 20 MG PO TABS: 20.0000 mg | ORAL_TABLET | Freq: Every day | ORAL | Status: DC

## 2013-09-29 MED ORDER — TOPIRAMATE 50 MG PO TABS: 50.0000 mg | ORAL_TABLET | Freq: Two times a day (BID) | ORAL | Status: DC

## 2013-09-29 MED ORDER — BUPROPION HCL ER (XL) 150 MG PO TB24: 450.0000 mg | ORAL_TABLET | Freq: Every day | ORAL | Status: DC

## 2013-09-29 MED ORDER — PROPRANOLOL HCL 40 MG PO TABS: 40.0000 mg | ORAL_TABLET | Freq: Two times a day (BID) | ORAL | Status: DC

## 2013-09-29 NOTE — Patient Instructions (Signed)
Health Maintenance, Female A healthy lifestyle and preventative care can promote health and wellness.  Maintain regular health, dental, and eye exams.  Eat a healthy diet. Foods like vegetables, fruits, whole grains, low-fat dairy products, and lean protein foods contain the nutrients you need without too many calories. Decrease your intake of foods high in solid fats, added sugars, and salt. Get information about a proper diet from your caregiver, if necessary.  Regular physical exercise is one of the most important things you can do for your health. Most adults should get at least 150 minutes of moderate-intensity exercise (any activity that increases your heart rate and causes you to sweat) each week. In addition, most adults need muscle-strengthening exercises on 2 or more days a week.   Maintain a healthy weight. The body mass index (BMI) is a screening tool to identify possible weight problems. It provides an estimate of body fat based on height and weight. Your caregiver can help determine your BMI, and can help you achieve or maintain a healthy weight. For adults 20 years and older:  A BMI below 18.5 is considered underweight.  A BMI of 18.5 to 24.9 is normal.  A BMI of 25 to 29.9 is considered overweight.  A BMI of 30 and above is considered obese.  Maintain normal blood lipids and cholesterol by exercising and minimizing your intake of saturated fat. Eat a balanced diet with plenty of fruits and vegetables. Blood tests for lipids and cholesterol should begin at age 20 and be repeated every 5 years. If your lipid or cholesterol levels are high, you are over 50, or you are a high risk for heart disease, you may need your cholesterol levels checked more frequently.Ongoing high lipid and cholesterol levels should be treated with medicines if diet and exercise are not effective.  If you smoke, find out from your caregiver how to quit. If you do not use tobacco, do not start.  Lung  cancer screening is recommended for adults aged 55 80 years who are at high risk for developing lung cancer because of a history of smoking. Yearly low-dose computed tomography (CT) is recommended for people who have at least a 30-pack-year history of smoking and are a current smoker or have quit within the past 15 years. A pack year of smoking is smoking an average of 1 pack of cigarettes a day for 1 year (for example: 1 pack a day for 30 years or 2 packs a day for 15 years). Yearly screening should continue until the smoker has stopped smoking for at least 15 years. Yearly screening should also be stopped for people who develop a health problem that would prevent them from having lung cancer treatment.  If you are pregnant, do not drink alcohol. If you are breastfeeding, be very cautious about drinking alcohol. If you are not pregnant and choose to drink alcohol, do not exceed 1 drink per day. One drink is considered to be 12 ounces (355 mL) of beer, 5 ounces (148 mL) of wine, or 1.5 ounces (44 mL) of liquor.  Avoid use of street drugs. Do not share needles with anyone. Ask for help if you need support or instructions about stopping the use of drugs.  High blood pressure causes heart disease and increases the risk of stroke. Blood pressure should be checked at least every 1 to 2 years. Ongoing high blood pressure should be treated with medicines, if weight loss and exercise are not effective.  If you are 55 to   53 years old, ask your caregiver if you should take aspirin to prevent strokes.  Diabetes screening involves taking a blood sample to check your fasting blood sugar level. This should be done once every 3 years, after age 29, if you are within normal weight and without risk factors for diabetes. Testing should be considered at a younger age or be carried out more frequently if you are overweight and have at least 1 risk factor for diabetes.  Breast cancer screening is essential preventative care  for women. You should practice "breast self-awareness." This means understanding the normal appearance and feel of your breasts and may include breast self-examination. Any changes detected, no matter how small, should be reported to a caregiver. Women in their 17s and 30s should have a clinical breast exam (CBE) by a caregiver as part of a regular health exam every 1 to 3 years. After age 74, women should have a CBE every year. Starting at age 80, women should consider having a mammogram (breast X-ray) every year. Women who have a family history of breast cancer should talk to their caregiver about genetic screening. Women at a high risk of breast cancer should talk to their caregiver about having an MRI and a mammogram every year.  Breast cancer gene (BRCA)-related cancer risk assessment is recommended for women who have family members with BRCA-related cancers. BRCA-related cancers include breast, ovarian, tubal, and peritoneal cancers. Having family members with these cancers may be associated with an increased risk for harmful changes (mutations) in the breast cancer genes BRCA1 and BRCA2. Results of the assessment will determine the need for genetic counseling and BRCA1 and BRCA2 testing.  The Pap test is a screening test for cervical cancer. Women should have a Pap test starting at age 29. Between ages 19 and 68, Pap tests should be repeated every 2 years. Beginning at age 49, you should have a Pap test every 3 years as long as the past 3 Pap tests have been normal. If you had a hysterectomy for a problem that was not cancer or a condition that could lead to cancer, then you no longer need Pap tests. If you are between ages 19 and 80, and you have had normal Pap tests going back 10 years, you no longer need Pap tests. If you have had past treatment for cervical cancer or a condition that could lead to cancer, you need Pap tests and screening for cancer for at least 20 years after your treatment. If Pap  tests have been discontinued, risk factors (such as a new sexual partner) need to be reassessed to determine if screening should be resumed. Some women have medical problems that increase the chance of getting cervical cancer. In these cases, your caregiver may recommend more frequent screening and Pap tests.  The human papillomavirus (HPV) test is an additional test that may be used for cervical cancer screening. The HPV test looks for the virus that can cause the cell changes on the cervix. The cells collected during the Pap test can be tested for HPV. The HPV test could be used to screen women aged 30 years and older, and should be used in women of any age who have unclear Pap test results. After the age of 59, women should have HPV testing at the same frequency as a Pap test.  Colorectal cancer can be detected and often prevented. Most routine colorectal cancer screening begins at the age of 85 and continues through age 71. However, your caregiver  may recommend screening at an earlier age if you have risk factors for colon cancer. On a yearly basis, your caregiver may provide home test kits to check for hidden blood in the stool. Use of a small camera at the end of a tube, to directly examine the colon (sigmoidoscopy or colonoscopy), can detect the earliest forms of colorectal cancer. Talk to your caregiver about this at age 63, when routine screening begins. Direct examination of the colon should be repeated every 5 to 10 years through age 63, unless early forms of pre-cancerous polyps or small growths are found.  Hepatitis C blood testing is recommended for all people born from [birth year] through 1965 and any individual with known risks for hepatitis C.  Practice safe sex. Use condoms and avoid high-risk sexual practices to reduce the spread of sexually transmitted infections (STIs). Sexually active women aged 91 and younger should be checked for Chlamydia, which is a common sexually transmitted infection.  Older women with new or multiple partners should also be tested for Chlamydia. Testing for other STIs is recommended if you are sexually active and at increased risk.  Osteoporosis is a disease in which the bones lose minerals and strength with aging. This can result in serious bone fractures. The risk of osteoporosis can be identified using a bone density scan. Women ages 69 and over and women at risk for fractures or osteoporosis should discuss screening with their caregivers. Ask your caregiver whether you should be taking a calcium supplement or vitamin D to reduce the rate of osteoporosis.  Menopause can be associated with physical symptoms and risks. Hormone replacement therapy is available to decrease symptoms and risks. You should talk to your caregiver about whether hormone replacement therapy is right for you.  Use sunscreen. Apply sunscreen liberally and repeatedly throughout the day. You should seek shade when your shadow is shorter than you. Protect yourself by wearing long sleeves, pants, a wide-brimmed hat, and sunglasses year round, whenever you are outdoors.  Notify your caregiver of new moles or changes in moles, especially if there is a change in shape or color. Also notify your caregiver if a mole is larger than the size of a pencil eraser.  Stay current with your immunizations. Document Released: 12/11/2010 Document Revised: 09/22/2012 Document Reviewed: 12/11/2010 [facility] Patient Information 2014 [place].

## 2013-09-29 NOTE — Progress Notes (Signed)
Subjective:    Patient ID: [name], female    DOB: 14-Jul-1960, 53 y.o.   MRN: [id number]  Patient here today for CPE and PAP- She is doing well without complaints. HAS BEEN OUT OF DIABETIC MEDS FOR OVER A MONTH  Hyperlipidemia This is a chronic problem. The current episode started more than 1 year ago. The problem is uncontrolled. Exacerbating diseases include diabetes and hypothyroidism. Associated symptoms include myalgias. Pertinent negatives include no chest pain or shortness of breath. She is currently on no antihyperlipidemic treatment. The current treatment provides no improvement of lipids. Compliance problems include medication side effects.  Risk factors for coronary artery disease include post-menopausal, obesity, dyslipidemia and family history.  Migraine  This is a chronic (Patient goes th headache clinic and was recently started on baclofen 10 1-2 prn headache. Patient says it really works but sh ecannot afford to go to headache clinic right now and would like refill on meds.) problem. The current episode started more than 1 year ago. The problem occurs monthly. The problem has been gradually improving. The pain is located in the left unilateral and occipital region. The pain does not radiate. The pain quality is similar to prior headaches. The quality of the pain is described as stabbing. The pain is at a severity of 10/10. The pain is moderate. Pertinent negatives include no blurred vision, dizziness, numbness, seizures, visual change or weight loss. The treatment provided moderate relief. There is no history of cancer or hypertension.  Hypertension This is a chronic problem. The current episode started more than 1 year ago. The problem has been resolved since onset. The problem is controlled. Associated symptoms include anxiety. Pertinent negatives include no blurred vision, chest pain, palpitations, peripheral edema or shortness of breath. There are no associated agents to  hypertension. Risk factors for coronary artery disease include diabetes mellitus, dyslipidemia, obesity, post-menopausal state, family history and stress. Past treatments include ACE inhibitors and beta blockers. The current treatment provides moderate improvement. Compliance problems include diet.  Hypertensive end-organ damage includes a thyroid problem. There is no history of heart failure.  Diabetes She presents for her follow-up diabetic visit. She has type 2 diabetes mellitus. No MedicAlert identification noted. Her disease course has been stable. Pertinent negatives for hypoglycemia include no dizziness or seizures. Associated symptoms include fatigue. Pertinent negatives for diabetes include no blurred vision, no chest pain, no foot ulcerations, no visual change and no weight loss. Pertinent negatives for hypoglycemia complications include no blackouts and no hospitalization. Symptoms are stable. Diabetic complications include peripheral neuropathy (both hands). (Pt states the numbness in hands is from "carpal tunnel") Risk factors for coronary artery disease include hypertension, obesity, dyslipidemia, diabetes mellitus and post-menopausal. Current diabetic treatment includes diet (patient has been out of her meds due to no money.). She is compliant with treatment none of the time. When asked about meal planning, she reported none. She participates in exercise weekly. Her breakfast blood glucose is taken between 9-10 am. Her breakfast blood glucose range is generally 130-140 mg/dl. She does not see a podiatrist.Eye exam is current (April 2013).  Thyroid Problem Presents for follow-up (hypothyroidism) visit. Symptoms include fatigue. Patient reports no depressed mood, diaphoresis, diarrhea, dry skin, hoarse voice, menstrual problem, palpitations, visual change or weight loss. The symptoms have been stable. Her past medical history is significant for diabetes and hyperlipidemia. There is no history of  heart failure.    Review of Systems  Constitutional: Positive for fatigue. Negative for weight loss and  diaphoresis.  HENT: Negative for hoarse voice.   Eyes: Negative for blurred vision.  Respiratory: Negative for shortness of breath.   Cardiovascular: Negative for chest pain and palpitations.  Gastrointestinal: Negative for diarrhea.  Genitourinary: Negative for menstrual problem.  Musculoskeletal: Positive for myalgias.  Neurological: Negative for dizziness, seizures and numbness.  All other systems reviewed and are negative.      Objective:   Physical Exam  Constitutional: She is oriented to person, place, and time. She appears well-developed and well-nourished.  HENT:  Head: Normocephalic.  Right Ear: Hearing, tympanic membrane, external ear and ear canal normal.  Left Ear: Hearing, tympanic membrane, external ear and ear canal normal.  Nose: Nose normal.  Mouth/Throat: Uvula is midline and oropharynx is clear and moist.  Eyes: Conjunctivae and EOM are normal. Pupils are equal, round, and reactive to light.  Neck: Trachea normal, normal range of motion and full passive range of motion without pain. Neck supple. No JVD present. Carotid bruit is not present. No mass and no thyromegaly present.  Cardiovascular: Normal rate, regular rhythm, normal heart sounds and intact distal pulses.  Exam reveals no gallop and no friction rub.   No murmur heard. Pulmonary/Chest: Effort normal and breath sounds normal. Right breast exhibits no inverted nipple, no mass, no nipple discharge, no skin change and no tenderness. Left breast exhibits no inverted nipple, no mass, no nipple discharge, no skin change and no tenderness.  Abdominal: Soft. Bowel sounds are normal. She exhibits no distension and no mass. There is no tenderness.  Genitourinary: Vagina normal and uterus normal. Guaiac positive stool. No breast swelling, tenderness, discharge or bleeding. No vaginal discharge found.  bimanual  exam-No adnexal masses or tenderness. Vaginal cuff intact  Musculoskeletal: Normal range of motion.  Lymphadenopathy:    She has no cervical adenopathy.  Neurological: She is alert and oriented to person, place, and time. She has normal reflexes.  +4/4 monofilament bilaterally   Skin: Skin is warm and dry.  Psychiatric: She has a normal mood and affect. Her behavior is normal. Judgment and thought content normal.    BP 144/75  Pulse 71  Temp(Src) 98.3 F (36.8 C) (Oral)  Ht 5' 4"  (1.626 m)  Wt 239 lb (108.41 kg)  BMI 41.00 kg/m2  Results for orders placed in visit on 09/29/13  POCT GLYCOSYLATED HEMOGLOBIN (HGB A1C)      Result Value Ref Range   Hemoglobin A1C 9.9%    HM DIABETES EYE EXAM      Result Value Ref Range   HM Diabetic Eye Exam No Retinopathy  No Retinopathy  HM DIABETES FOOT EXAM      Result Value Ref Range   HM Diabetic Foot Exam normal         Assessment & Plan:   1. Annual physical exam   2. Encounter for routine gynecological examination   3. Hyperlipidemia   4. DIABETES MELLITUS, TYPE II   5. DEPRESSION   6. ANXIETY   7. ASTHMA   8. Severe obesity (BMI >= 40)   9. Weight loss counseling, encounter for   10. Essential hypertension, benign   11. Migraine with status migrainosus   12. Unspecified hypothyroidism    Orders Placed This Encounter  Procedures  . CMP14+EGFR  . NMR, lipoprofile  . Thyroid Panel With TSH  . POCT urinalysis dipstick  . POCT UA - Microscopic Only  . POCT CBC  . POCT glycosylated hemoglobin (Hb A1C)  . HM DIABETES EYE EXAM  This external order was created through the Results Console.  Marland Kitchen HM DIABETES FOOT EXAM    This external order was created through the Results Console.   Meds ordered this encounter  Medications  . benazepril (LOTENSIN) 20 MG tablet    Sig: Take 1 tablet (20 mg total) by mouth daily.    Dispense:  30 tablet    Refill:  1    Order Specific Question:  Supervising Provider    Answer:  Chipper Herb [[id number]]  . propranolol (INDERAL) 40 MG tablet    Sig: Take 1 tablet (40 mg total) by mouth 2 (two) times daily.    Dispense:  60 tablet    Refill:  1    Order Specific Question:  Supervising Provider    Answer:  Chipper Herb [[id number]]  . topiramate (TOPAMAX) 50 MG tablet    Sig: Take 1 tablet (50 mg total) by mouth 2 (two) times daily.    Dispense:  60 tablet    Refill:  1    Order Specific Question:  Supervising Provider    Answer:  Chipper Herb [[id number]]  . levothyroxine (LEVOTHROID) 75 MCG tablet    Sig: Take 1 tablet (75 mcg total) by mouth daily.    Dispense:  30 tablet    Refill:  5    Order Specific Question:  Supervising Provider    Answer:  Chipper Herb [[id number]]  . buPROPion (WELLBUTRIN XL) 150 MG 24 hr tablet    Sig: Take 3 tablets (450 mg total) by mouth daily.    Dispense:  90 tablet    Refill:  1    Order Specific Question:  Supervising Provider    Answer:  Chipper Herb [[id number]]  get back on diabetic meds- watch diet Discussed stress relief Labs pending Health maintenance reviewed Diet and exercise encouraged Continue all meds Follow up  In 3 months   [place], FNP

## 2013-09-30 LAB — NMR, LIPOPROFILE
CHOLESTEROL: 315 mg/dL — AB (ref <200)
HDL Cholesterol by NMR: 34 mg/dL — ABNORMAL LOW (ref >=40)
HDL Particle Number: 30.6 umol/L (ref >=30.5)
LDL Particle Number: 3432 nmol/L — ABNORMAL HIGH (ref <1000)
LDL SIZE: 19.4 nm (ref >20.5)
LP-IR SCORE: 67 — AB (ref <=45)
Small LDL Particle Number: >2300 nmol/L — ABNORMAL HIGH (ref <=527)
Triglycerides by NMR: 481 mg/dL — ABNORMAL HIGH (ref <150)

## 2013-09-30 LAB — CMP14+EGFR
ALK PHOS: 90 IU/L (ref 39–117)
ALT: 58 IU/L — ABNORMAL HIGH (ref 0–32)
AST: 34 IU/L (ref 0–40)
Albumin/Globulin Ratio: 1.7 (ref 1.1–2.5)
Albumin: 4.3 g/dL (ref 3.5–5.5)
BUN / CREAT RATIO: 18 (ref 9–23)
BUN: 11 mg/dL (ref 6–24)
CO2: 23 mmol/L (ref 18–29)
CREATININE: 0.62 mg/dL (ref 0.57–1.00)
Calcium: 9.3 mg/dL (ref 8.7–10.2)
Chloride: 99 mmol/L (ref 97–108)
GFR calc non Af Amer: 103 mL/min/{1.73_m2} (ref >59)
GFR, EST AFRICAN AMERICAN: 119 mL/min/{1.73_m2} (ref >59)
GLOBULIN, TOTAL: 2.5 g/dL (ref 1.5–4.5)
Glucose: 281 mg/dL — ABNORMAL HIGH (ref 65–99)
Potassium: 4.7 mmol/L (ref 3.5–5.2)
Sodium: 137 mmol/L (ref 134–144)
Total Bilirubin: 0.2 mg/dL (ref 0.0–1.2)
Total Protein: 6.8 g/dL (ref 6.0–8.5)

## 2013-09-30 LAB — THYROID PANEL WITH TSH
FREE THYROXINE INDEX: 1.9 (ref 1.2–4.9)
T3 Uptake Ratio: 28 % (ref 24–39)
T4, Total: 6.9 ug/dL (ref 4.5–12.0)
TSH: 1.67 u[IU]/mL (ref 0.450–4.500)

## 2013-10-01 ENCOUNTER — Other Ambulatory Visit: Payer: Self-pay | Admitting: Nurse Practitioner

## 2013-10-01 ENCOUNTER — Telehealth: Payer: Self-pay | Admitting: Nurse Practitioner

## 2013-10-01 LAB — PAP IG W/ RFLX HPV ASCU: PAP Smear Comment: 0

## 2013-10-01 MED ORDER — ATORVASTATIN CALCIUM 40 MG PO TABS: 40.0000 mg | ORAL_TABLET | Freq: Every day | ORAL | Status: DC

## 2013-10-01 NOTE — Telephone Encounter (Signed)
mucinex otc

## 2013-10-02 NOTE — Telephone Encounter (Signed)
Patient aware.

## 2013-10-09 ENCOUNTER — Encounter: Payer: Self-pay | Admitting: *Deleted

## 2013-12-31 ENCOUNTER — Ambulatory Visit (INDEPENDENT_AMBULATORY_CARE_PROVIDER_SITE_OTHER): Payer: BC Managed Care – PPO | Admitting: Nurse Practitioner

## 2013-12-31 ENCOUNTER — Telehealth: Payer: Self-pay | Admitting: Nurse Practitioner

## 2013-12-31 ENCOUNTER — Encounter: Payer: Self-pay | Admitting: Nurse Practitioner

## 2013-12-31 VITALS — BP 142/84 | HR 87 | Temp 98.2°F | Ht 64.0 in | Wt 236.2 lb

## 2013-12-31 DIAGNOSIS — F329 Major depressive disorder, single episode, unspecified: Secondary | ICD-10-CM

## 2013-12-31 DIAGNOSIS — F411 Generalized anxiety disorder: Secondary | ICD-10-CM

## 2013-12-31 DIAGNOSIS — E119 Type 2 diabetes mellitus without complications: Secondary | ICD-10-CM

## 2013-12-31 DIAGNOSIS — Z87898 Personal history of other specified conditions: Secondary | ICD-10-CM

## 2013-12-31 DIAGNOSIS — G43711 Chronic migraine without aura, intractable, with status migrainosus: Secondary | ICD-10-CM

## 2013-12-31 DIAGNOSIS — F3289 Other specified depressive episodes: Secondary | ICD-10-CM

## 2013-12-31 DIAGNOSIS — E785 Hyperlipidemia, unspecified: Secondary | ICD-10-CM

## 2013-12-31 DIAGNOSIS — E039 Hypothyroidism, unspecified: Secondary | ICD-10-CM

## 2013-12-31 DIAGNOSIS — Z713 Dietary counseling and surveillance: Secondary | ICD-10-CM

## 2013-12-31 DIAGNOSIS — I10 Essential (primary) hypertension: Secondary | ICD-10-CM

## 2013-12-31 DIAGNOSIS — Z23 Encounter for immunization: Secondary | ICD-10-CM

## 2013-12-31 LAB — POCT GLYCOSYLATED HEMOGLOBIN (HGB A1C)

## 2013-12-31 LAB — GLUCOSE, POCT (MANUAL RESULT ENTRY): POC GLUCOSE: 330 mg/dL — AB (ref 70–99)

## 2013-12-31 MED ORDER — TOPIRAMATE 50 MG PO TABS: 50.0000 mg | ORAL_TABLET | Freq: Two times a day (BID) | ORAL | Status: DC

## 2013-12-31 MED ORDER — LEVOTHYROXINE SODIUM 75 MCG PO TABS: 75.0000 ug | ORAL_TABLET | Freq: Every day | ORAL | Status: DC

## 2013-12-31 MED ORDER — BENAZEPRIL HCL 20 MG PO TABS: 20.0000 mg | ORAL_TABLET | Freq: Every day | ORAL | Status: DC

## 2013-12-31 MED ORDER — ATORVASTATIN CALCIUM 40 MG PO TABS: 40.0000 mg | ORAL_TABLET | Freq: Every day | ORAL | Status: DC

## 2013-12-31 MED ORDER — PROPRANOLOL HCL 40 MG PO TABS: 40.0000 mg | ORAL_TABLET | Freq: Two times a day (BID) | ORAL | Status: DC

## 2013-12-31 MED ORDER — BUPROPION HCL ER (XL) 150 MG PO TB24
450.0000 mg | ORAL_TABLET | Freq: Every day | ORAL | Status: DC
Start: 2013-12-31 — End: 2013-12-31

## 2013-12-31 MED ORDER — SAXAGLIPTIN-METFORMIN ER 5-1000 MG PO TB24: 1.0000 | ORAL_TABLET | Freq: Every day | ORAL | Status: DC

## 2013-12-31 MED ORDER — BUPROPION HCL ER (XL) 150 MG PO TB24: 450.0000 mg | ORAL_TABLET | Freq: Every day | ORAL | Status: DC

## 2013-12-31 NOTE — Progress Notes (Signed)
Subjective:    Patient ID: [name], female    DOB: May 17, 1961, 52 y.o.   MRN: [id number]  Patient here today for follow up- she is doing quits well right now. No complaints.  Diabetes She presents for her follow-up diabetic visit. She has type 2 diabetes mellitus. No MedicAlert identification noted. Her disease course has been stable. Pertinent negatives for hypoglycemia include no dizziness or seizures. Associated symptoms include fatigue. Pertinent negatives for diabetes include no blurred vision, no chest pain, no foot ulcerations, no visual change and no weight loss. Pertinent negatives for hypoglycemia complications include no blackouts and no hospitalization. Symptoms are stable. Diabetic complications include peripheral neuropathy (both hands). (Pt states the numbness in hands is from "carpal tunnel") Risk factors for coronary artery disease include hypertension, obesity, dyslipidemia, diabetes mellitus and post-menopausal. Current diabetic treatment includes diet (patient has been out of her meds due to no money.). She is compliant with treatment none of the time. When asked about meal planning, she reported none. She participates in exercise weekly. Her breakfast blood glucose is taken between 9-10 am. Her breakfast blood glucose range is generally 130-140 mg/dl. She does not see a podiatrist.Eye exam is current (April 2013).  Hyperlipidemia This is a chronic problem. The current episode started more than 1 year ago. The problem is uncontrolled. Exacerbating diseases include diabetes and hypothyroidism. Associated symptoms include myalgias. Pertinent negatives include no chest pain or shortness of breath. She is currently on no antihyperlipidemic treatment. The current treatment provides no improvement of lipids. Compliance problems include medication side effects.  Risk factors for coronary artery disease include post-menopausal, obesity, dyslipidemia and family history.  Migraine   This is a chronic (Patient goes th headache clinic and was recently started on baclofen 10 1-2 prn headache. Patient says it really works but sh ecannot afford to go to headache clinic right now and would like refill on meds.) problem. The current episode started more than 1 year ago. The problem occurs monthly. The problem has been gradually improving. The pain is located in the left unilateral and occipital region. The pain does not radiate. The pain quality is similar to prior headaches. The quality of the pain is described as stabbing. The pain is at a severity of 10/10. The pain is moderate. Pertinent negatives include no blurred vision, dizziness, numbness, seizures, visual change or weight loss. The treatment provided moderate relief. There is no history of cancer or hypertension.  Hypertension This is a chronic problem. The current episode started more than 1 year ago. The problem has been resolved since onset. The problem is controlled. Associated symptoms include anxiety. Pertinent negatives include no blurred vision, chest pain, palpitations, peripheral edema or shortness of breath. There are no associated agents to hypertension. Risk factors for coronary artery disease include diabetes mellitus, dyslipidemia, obesity, post-menopausal state, family history and stress. Past treatments include ACE inhibitors and beta blockers. The current treatment provides moderate improvement. Compliance problems include diet.  Hypertensive end-organ damage includes a thyroid problem. There is no history of heart failure.  Thyroid Problem Presents for follow-up (hypothyroidism) visit. Symptoms include fatigue. Patient reports no depressed mood, diaphoresis, diarrhea, dry skin, hoarse voice, menstrual problem, palpitations, visual change or weight loss. The symptoms have been stable. Her past medical history is significant for diabetes and hyperlipidemia. There is no history of heart failure.    Review of Systems   Constitutional: Positive for fatigue. Negative for weight loss and diaphoresis.  HENT: Negative for hoarse voice.  Eyes: Negative for blurred vision.  Respiratory: Negative for shortness of breath.   Cardiovascular: Negative for chest pain and palpitations.  Gastrointestinal: Negative for diarrhea.  Genitourinary: Negative for menstrual problem.  Musculoskeletal: Positive for myalgias.  Neurological: Negative for dizziness, seizures and numbness.  All other systems reviewed and are negative.      Objective:   Physical Exam  Constitutional: She is oriented to person, place, and time. She appears well-developed and well-nourished.  HENT:  Nose: Nose normal.  Mouth/Throat: Oropharynx is clear and moist.  Eyes: EOM are normal.  Neck: Trachea normal, normal range of motion and full passive range of motion without pain. Neck supple. No JVD present. Carotid bruit is not present. No thyromegaly present.  Cardiovascular: Normal rate, regular rhythm, normal heart sounds and intact distal pulses.  Exam reveals no gallop and no friction rub.   No murmur heard. Pulmonary/Chest: Effort normal and breath sounds normal.  Abdominal: Soft. Bowel sounds are normal. She exhibits no distension and no mass. There is no tenderness.  Musculoskeletal: Normal range of motion.  Lymphadenopathy:    She has no cervical adenopathy.  Neurological: She is alert and oriented to person, place, and time. She has normal reflexes.  +4/4 monofilament bilaterally   Skin: Skin is warm and dry.  Psychiatric: She has a normal mood and affect. Her behavior is normal. Judgment and thought content normal.    BP 142/84  Pulse 87  Temp(Src) 98.2 F (36.8 C) (Oral)  Ht _0  (1.626 m)  Wt 236 lb 3.2 oz (107.14 kg)  BMI 40.52 kg/m2  Results for orders placed in visit on 12/31/13  POCT GLYCOSYLATED HEMOGLOBIN (HGB A1C)      Result Value Ref Range   Hemoglobin A1C 10.9%           Assessment & Plan:    1.  MIGRAINES, HX OF   2. Hyperlipidemia   3. DEPRESSION   4. ANXIETY   5. Severe obesity (BMI >= 40)   6. Weight loss counseling, encounter for   7. Intractable chronic migraine without aura and with status migrainosus   8. Unspecified hypothyroidism   9. Essential hypertension, benign   10. DIABETES MELLITUS, TYPE II    Orders Placed This Encounter  Procedures  . CMP14+EGFR  . NMR, lipoprofile  . POCT glycosylated hemoglobin (Hb A1C)   Meds ordered this encounter  Medications  . topiramate (TOPAMAX) 50 MG tablet    Sig: Take 1 tablet (50 mg total) by mouth 2 (two) times daily.    Dispense:  60 tablet    Refill:  1    Order Specific Question:  Supervising Provider    Answer:  Chipper Herb [[id number]]  . Saxagliptin-Metformin (KOMBIGLYZE XR) 10-998 MG TB24    Sig: Take 1 tablet by mouth daily.    Dispense:  60 tablet    Refill:  0    Order Specific Question:  Supervising Provider    Answer:  Chipper Herb [[id number]]  . propranolol (INDERAL) 40 MG tablet    Sig: Take 1 tablet (40 mg total) by mouth 2 (two) times daily.    Dispense:  60 tablet    Refill:  1    Order Specific Question:  Supervising Provider    Answer:  Chipper Herb [[id number]]  . levothyroxine (LEVOTHROID) 75 MCG tablet    Sig: Take 1 tablet (75 mcg total) by mouth daily.    Dispense:  30 tablet    Refill:  5    Order Specific Question:  Supervising Provider    Answer:  Chipper Herb [[id number]]  . buPROPion (WELLBUTRIN XL) 150 MG 24 hr tablet    Sig: Take 3 tablets (450 mg total) by mouth daily.    Dispense:  90 tablet    Refill:  1    Order Specific Question:  Supervising Provider    Answer:  Chipper Herb [[id number]]  . benazepril (LOTENSIN) 20 MG tablet    Sig: Take 1 tablet (20 mg total) by mouth daily.    Dispense:  90 tablet    Refill:  1    Order Specific Question:  Supervising Provider    Answer:  Chipper Herb [[id number]]  . atorvastatin (LIPITOR) 40 MG tablet    Sig: Take 1 tablet (40 mg total) by mouth  daily.    Dispense:  90 tablet    Refill:  1    Let patient know when rx is ready for pick up    Order Specific Question:  Supervising Provider    Answer:  Chipper Herb [[id number]]   Appointment with T. Eckerd to discuss diabetes- May want to start [place] pending Health maintenance reviewed Diet and exercise encouraged Continue all meds Follow up  In 3 months   [place], FNP

## 2013-12-31 NOTE — Telephone Encounter (Signed)
I sent the Rx to the pharmacy.

## 2013-12-31 NOTE — Patient Instructions (Signed)

## 2014-01-01 ENCOUNTER — Ambulatory Visit: Payer: BC Managed Care – PPO | Admitting: Nurse Practitioner

## 2014-01-01 LAB — CMP14+EGFR
ALT: 80 IU/L — ABNORMAL HIGH (ref 0–32)
AST: 55 IU/L — AB (ref 0–40)
Albumin/Globulin Ratio: 1.9 (ref 1.1–2.5)
Albumin: 4.4 g/dL (ref 3.5–5.5)
Alkaline Phosphatase: 104 IU/L (ref 39–117)
BUN/Creatinine Ratio: 17 (ref 9–23)
BUN: 12 mg/dL (ref 6–24)
CALCIUM: 9.8 mg/dL (ref 8.7–10.2)
CO2: 23 mmol/L (ref 18–29)
Chloride: 96 mmol/L — ABNORMAL LOW (ref 97–108)
Creatinine, Ser: 0.72 mg/dL (ref 0.57–1.00)
GFR calc Af Amer: 111 mL/min/{1.73_m2} (ref >59)
GFR calc non Af Amer: 96 mL/min/{1.73_m2} (ref >59)
Globulin, Total: 2.3 g/dL (ref 1.5–4.5)
Glucose: 342 mg/dL — ABNORMAL HIGH (ref 65–99)
Potassium: 4.8 mmol/L (ref 3.5–5.2)
Sodium: 136 mmol/L (ref 134–144)
Total Bilirubin: 0.4 mg/dL (ref 0.0–1.2)
Total Protein: 6.7 g/dL (ref 6.0–8.5)

## 2014-01-01 LAB — NMR, LIPOPROFILE
Cholesterol: 360 mg/dL — ABNORMAL HIGH (ref 100–199)
HDL CHOLESTEROL BY NMR: 34 mg/dL — AB (ref >39)
HDL PARTICLE NUMBER: 28.5 umol/L — AB (ref >=30.5)
LDL Particle Number: >3500 nmol/L — ABNORMAL HIGH (ref <1000)
LDL Size: 19.5 nm (ref >20.5)
LP-IR Score: 86 — ABNORMAL HIGH (ref <=45)
Small LDL Particle Number: >2300 nmol/L — ABNORMAL HIGH (ref <=527)
TRIGLYCERIDES BY NMR: 744 mg/dL — AB (ref 0–149)

## 2014-01-04 ENCOUNTER — Telehealth: Payer: Self-pay | Admitting: Pharmacist

## 2014-01-04 ENCOUNTER — Ambulatory Visit: Payer: BC Managed Care – PPO

## 2014-01-04 NOTE — Telephone Encounter (Signed)
Patient with uncontrolled type 2 DM - she had appt today but rescheduled.  Called to reschedule but patient had family emergency.  She is going [employer], [employer].  She will call when she returns to make appt.

## 2014-01-15 ENCOUNTER — Other Ambulatory Visit: Payer: Self-pay | Admitting: Nurse Practitioner

## 2014-02-10 ENCOUNTER — Other Ambulatory Visit: Payer: Self-pay | Admitting: Nurse Practitioner

## 2014-04-14 ENCOUNTER — Ambulatory Visit: Payer: BC Managed Care – PPO | Admitting: Nurse Practitioner

## 2014-06-09 ENCOUNTER — Ambulatory Visit (INDEPENDENT_AMBULATORY_CARE_PROVIDER_SITE_OTHER): Payer: BC Managed Care – PPO | Admitting: Nurse Practitioner

## 2014-06-09 ENCOUNTER — Encounter: Payer: Self-pay | Admitting: Nurse Practitioner

## 2014-06-09 VITALS — BP 128/84 | HR 66 | Temp 97.1°F | Ht 64.0 in | Wt 241.0 lb

## 2014-06-09 DIAGNOSIS — F32A Depression, unspecified: Secondary | ICD-10-CM

## 2014-06-09 DIAGNOSIS — E1142 Type 2 diabetes mellitus with diabetic polyneuropathy: Secondary | ICD-10-CM

## 2014-06-09 DIAGNOSIS — I10 Essential (primary) hypertension: Secondary | ICD-10-CM

## 2014-06-09 DIAGNOSIS — E785 Hyperlipidemia, unspecified: Secondary | ICD-10-CM

## 2014-06-09 DIAGNOSIS — J4520 Mild intermittent asthma, uncomplicated: Secondary | ICD-10-CM

## 2014-06-09 DIAGNOSIS — G43411 Hemiplegic migraine, intractable, with status migrainosus: Secondary | ICD-10-CM

## 2014-06-09 DIAGNOSIS — F411 Generalized anxiety disorder: Secondary | ICD-10-CM

## 2014-06-09 DIAGNOSIS — E039 Hypothyroidism, unspecified: Secondary | ICD-10-CM

## 2014-06-09 DIAGNOSIS — F329 Major depressive disorder, single episode, unspecified: Secondary | ICD-10-CM

## 2014-06-09 DIAGNOSIS — G43711 Chronic migraine without aura, intractable, with status migrainosus: Secondary | ICD-10-CM

## 2014-06-09 LAB — POCT GLYCOSYLATED HEMOGLOBIN (HGB A1C)

## 2014-06-09 MED ORDER — ATORVASTATIN CALCIUM 40 MG PO TABS: 40.0000 mg | ORAL_TABLET | Freq: Every day | ORAL | Status: DC

## 2014-06-09 MED ORDER — BENAZEPRIL HCL 20 MG PO TABS
20.0000 mg | ORAL_TABLET | Freq: Every day | ORAL | Status: DC
Start: 2014-06-09 — End: 2014-09-08

## 2014-06-09 MED ORDER — BUPROPION HCL ER (XL) 150 MG PO TB24: 450.0000 mg | ORAL_TABLET | Freq: Every day | ORAL | Status: DC

## 2014-06-09 MED ORDER — SAXAGLIPTIN-METFORMIN ER 5-1000 MG PO TB24: 1.0000 | ORAL_TABLET | Freq: Every day | ORAL | Status: DC

## 2014-06-09 MED ORDER — LEVOTHYROXINE SODIUM 75 MCG PO TABS
75.0000 ug | ORAL_TABLET | Freq: Every day | ORAL | Status: DC
Start: 2014-06-09 — End: 2014-09-08

## 2014-06-09 MED ORDER — PROPRANOLOL HCL 40 MG PO TABS: 40.0000 mg | ORAL_TABLET | Freq: Two times a day (BID) | ORAL | Status: DC

## 2014-06-09 MED ORDER — TOPIRAMATE 50 MG PO TABS: 50.0000 mg | ORAL_TABLET | Freq: Two times a day (BID) | ORAL | Status: DC

## 2014-06-09 NOTE — Progress Notes (Signed)
Subjective:    Patient ID: [name], female    DOB: 12-06-1960, 53 y.o.   MRN: [id number]  Patient here today for follow up- she is doing quits well right now. No complaints.  Hyperlipidemia This is a chronic problem. The current episode started more than 1 year ago. The problem is uncontrolled. Recent lipid tests were reviewed and are variable. Associated symptoms include myalgias. Pertinent negatives include no chest pain or shortness of breath. Current antihyperlipidemic treatment includes statins. The current treatment provides moderate improvement of lipids. Compliance problems include adherence to diet and adherence to exercise.  Risk factors for coronary artery disease include diabetes mellitus, dyslipidemia, hypertension, obesity and post-menopausal.  Migraine  This is a chronic problem. Pertinent negatives include no dizziness, numbness, seizures or visual change. Her past medical history is significant for hypertension.  Hypertension This is a chronic problem. The current episode started more than 1 year ago. The problem is uncontrolled. Pertinent negatives include no chest pain, palpitations or shortness of breath. Risk factors for coronary artery disease include diabetes mellitus, dyslipidemia, obesity and post-menopausal state. Past treatments include ACE inhibitors. The current treatment provides moderate improvement. Compliance problems include diet and exercise.  Hypertensive end-organ damage includes a thyroid problem.  Diabetes She presents for her follow-up diabetic visit. She has type 2 diabetes mellitus. No MedicAlert identification noted. Her disease course has been stable. Pertinent negatives for hypoglycemia include no dizziness or seizures. Associated symptoms include fatigue. Pertinent negatives for diabetes include no chest pain and no visual change. Risk factors for coronary artery disease include diabetes mellitus, dyslipidemia, family history, hypertension and  obesity. She has not had a previous visit with a dietitian. She rarely participates in exercise. (Not checking blood sugars) An ACE inhibitor/angiotensin II receptor blocker is being taken. She does not see a podiatrist.Eye exam is current.  Thyroid Problem Symptoms include fatigue. Patient reports no diaphoresis, diarrhea, menstrual problem, palpitations or visual change. Her past medical history is significant for hyperlipidemia.    Review of Systems  Constitutional: Positive for fatigue. Negative for diaphoresis.  Respiratory: Negative for shortness of breath.   Cardiovascular: Negative for chest pain and palpitations.  Gastrointestinal: Negative for diarrhea.  Genitourinary: Negative for menstrual problem.  Musculoskeletal: Positive for myalgias.  Neurological: Negative for dizziness, seizures and numbness.  All other systems reviewed and are negative.      Objective:   Physical Exam  Constitutional: She is oriented to person, place, and time. She appears well-developed and well-nourished.  HENT:  Nose: Nose normal.  Mouth/Throat: Oropharynx is clear and moist.  Eyes: EOM are normal.  Neck: Trachea normal, normal range of motion and full passive range of motion without pain. Neck supple. No JVD present. Carotid bruit is not present. No thyromegaly present.  Cardiovascular: Normal rate, regular rhythm, normal heart sounds and intact distal pulses.  Exam reveals no gallop and no friction rub.   No murmur heard. Pulmonary/Chest: Effort normal and breath sounds normal.  Abdominal: Soft. Bowel sounds are normal. She exhibits no distension and no mass. There is no tenderness.  Musculoskeletal: Normal range of motion.  Lymphadenopathy:    She has no cervical adenopathy.  Neurological: She is alert and oriented to person, place, and time. She has normal reflexes.  Skin: Skin is warm and dry.  Psychiatric: She has a normal mood and affect. Her behavior is normal. Judgment and thought  content normal.    BP 128/84 mmHg  Pulse 66  Temp(Src) 97.1 F (36.2 C) (  Oral)  Ht _0  (1.626 m)  Wt 241 lb (109.317 kg)  BMI 41.35 kg/m2   Results for orders placed or performed in visit on 06/09/14  POCT glycosylated hemoglobin (Hb A1C)  Result Value Ref Range   Hemoglobin A1C 9.3%          Assessment & Plan:    1. Hyperlipidemia Low fat diet - NMR, lipoprofile - atorvastatin (LIPITOR) 40 MG tablet; Take 1 tablet (40 mg total) by mouth daily.  Dispense: 30 tablet; Refill: 5  2. Essential hypertension Do  Not add salt to diet - CMP14+EGFR - benazepril (LOTENSIN) 20 MG tablet; Take 1 tablet (20 mg total) by mouth daily.  Dispense: 30 tablet; Refill: 5  3. Asthma, mild intermittent, uncomplicated   4. Type 2 diabetes mellitus with diabetic polyneuropathy hgba1c is coming down- will not make any changes to day Encouraged carb counting - POCT glycosylated hemoglobin (Hb A1C) - Saxagliptin-Metformin (KOMBIGLYZE XR) 10-998 MG TB24; Take 1 tablet by mouth daily.  Dispense: 60 tablet; Refill: 5  5. Hypothyroidism, unspecified hypothyroidism type - levothyroxine (LEVOTHROID) 75 MCG tablet; Take 1 tablet (75 mcg total) by mouth daily.  Dispense: 30 tablet; Refill: 5 - Thyroid Panel With TSH  6. Anxiety state Stress management  7. Intractable hemiplegic migraine with status migrainosus Avoid caffeine  8. Depression - buPROPion (WELLBUTRIN XL) 150 MG 24 hr tablet; Take 3 tablets (450 mg total) by mouth daily.  Dispense: 90 tablet; Refill: 5  9. Intractable chronic migraine without aura and with status migrainosus - propranolol (INDERAL) 40 MG tablet; Take 1 tablet (40 mg total) by mouth 2 (two) times daily.  Dispense: 60 tablet; Refill: 5 - topiramate (TOPAMAX) 50 MG tablet; Take 1 tablet (50 mg total) by mouth 2 (two) times daily.  Dispense: 60 tablet; Refill: 5  10. Severe obesity (BMI >= 40) Discussed diet and exercise for person with BMI >25 Will recheck  weight in 3-6 months     Labs pending Health maintenance reviewed Diet and exercise encouraged Continue all meds Follow up  In 3 months   [place], FNP

## 2014-06-09 NOTE — Patient Instructions (Signed)

## 2014-06-10 LAB — CMP14+EGFR
A/G RATIO: 1.8 (ref 1.1–2.5)
ALBUMIN: 4.4 g/dL (ref 3.5–5.5)
ALK PHOS: 96 IU/L (ref 39–117)
ALT: 61 IU/L — ABNORMAL HIGH (ref 0–32)
AST: 29 IU/L (ref 0–40)
BUN / CREAT RATIO: 28 — AB (ref 9–23)
BUN: 13 mg/dL (ref 6–24)
CO2: 22 mmol/L (ref 18–29)
Calcium: 9.7 mg/dL (ref 8.7–10.2)
Chloride: 97 mmol/L (ref 97–108)
Creatinine, Ser: 0.47 mg/dL — ABNORMAL LOW (ref 0.57–1.00)
GFR calc Af Amer: 130 mL/min/{1.73_m2} (ref >59)
GFR, EST NON AFRICAN AMERICAN: 113 mL/min/{1.73_m2} (ref >59)
GLOBULIN, TOTAL: 2.4 g/dL (ref 1.5–4.5)
Glucose: 269 mg/dL — ABNORMAL HIGH (ref 65–99)
Potassium: 4.4 mmol/L (ref 3.5–5.2)
Sodium: 136 mmol/L (ref 134–144)
Total Bilirubin: 0.2 mg/dL (ref 0.0–1.2)
Total Protein: 6.8 g/dL (ref 6.0–8.5)

## 2014-06-10 LAB — NMR, LIPOPROFILE
Cholesterol: 359 mg/dL — ABNORMAL HIGH (ref 100–199)
HDL Cholesterol by NMR: 36 mg/dL — ABNORMAL LOW (ref >39)
HDL Particle Number: 35.3 umol/L (ref >=30.5)
LDL Particle Number: 3011 nmol/L — ABNORMAL HIGH (ref <1000)
LDL SIZE: 19.7 nm (ref >20.5)
LP-IR SCORE: 84 — AB (ref <=45)
SMALL LDL PARTICLE NUMBER: 2177 nmol/L — AB (ref <=527)
TRIGLYCERIDES BY NMR: 773 mg/dL — AB (ref 0–149)

## 2014-06-10 LAB — THYROID PANEL WITH TSH
FREE THYROXINE INDEX: 2.1 (ref 1.2–4.9)
T3 Uptake Ratio: 27 % (ref 24–39)
T4 TOTAL: 7.9 ug/dL (ref 4.5–12.0)
TSH: 2.47 u[IU]/mL (ref 0.450–4.500)

## 2014-07-14 ENCOUNTER — Other Ambulatory Visit: Payer: Self-pay | Admitting: Nurse Practitioner

## 2014-07-15 NOTE — Telephone Encounter (Signed)
Please advise on refill, med not on list, last seen 06/09/14.

## 2014-09-08 ENCOUNTER — Ambulatory Visit (INDEPENDENT_AMBULATORY_CARE_PROVIDER_SITE_OTHER): Payer: BC Managed Care – PPO | Admitting: Nurse Practitioner

## 2014-09-08 ENCOUNTER — Encounter: Payer: Self-pay | Admitting: Nurse Practitioner

## 2014-09-08 ENCOUNTER — Other Ambulatory Visit: Payer: Self-pay | Admitting: Nurse Practitioner

## 2014-09-08 VITALS — BP 120/68 | HR 74 | Temp 98.0°F | Ht 64.0 in | Wt 240.0 lb

## 2014-09-08 DIAGNOSIS — G43711 Chronic migraine without aura, intractable, with status migrainosus: Secondary | ICD-10-CM | POA: Diagnosis not present

## 2014-09-08 DIAGNOSIS — E785 Hyperlipidemia, unspecified: Secondary | ICD-10-CM

## 2014-09-08 DIAGNOSIS — I10 Essential (primary) hypertension: Secondary | ICD-10-CM

## 2014-09-08 DIAGNOSIS — E039 Hypothyroidism, unspecified: Secondary | ICD-10-CM

## 2014-09-08 DIAGNOSIS — E1142 Type 2 diabetes mellitus with diabetic polyneuropathy: Secondary | ICD-10-CM

## 2014-09-08 DIAGNOSIS — F329 Major depressive disorder, single episode, unspecified: Secondary | ICD-10-CM

## 2014-09-08 DIAGNOSIS — F32A Depression, unspecified: Secondary | ICD-10-CM

## 2014-09-08 LAB — POCT GLYCOSYLATED HEMOGLOBIN (HGB A1C): HEMOGLOBIN A1C: 9.7

## 2014-09-08 MED ORDER — BENAZEPRIL HCL 20 MG PO TABS: 20.0000 mg | ORAL_TABLET | Freq: Every day | ORAL | Status: DC

## 2014-09-08 MED ORDER — ATORVASTATIN CALCIUM 40 MG PO TABS: 40.0000 mg | ORAL_TABLET | Freq: Every day | ORAL | Status: DC

## 2014-09-08 MED ORDER — BUPROPION HCL ER (XL) 150 MG PO TB24
450.0000 mg | ORAL_TABLET | Freq: Every day | ORAL | Status: DC
Start: 2014-09-08 — End: 2015-03-31

## 2014-09-08 MED ORDER — PROPRANOLOL HCL 40 MG PO TABS: 40.0000 mg | ORAL_TABLET | Freq: Two times a day (BID) | ORAL | Status: DC

## 2014-09-08 MED ORDER — GLIPIZIDE 10 MG PO TABS: 10.0000 mg | ORAL_TABLET | Freq: Every day | ORAL | Status: DC

## 2014-09-08 MED ORDER — SAXAGLIPTIN-METFORMIN ER 5-1000 MG PO TB24: 1.0000 | ORAL_TABLET | Freq: Every day | ORAL | Status: DC

## 2014-09-08 MED ORDER — TOPIRAMATE 50 MG PO TABS: 50.0000 mg | ORAL_TABLET | Freq: Two times a day (BID) | ORAL | Status: DC

## 2014-09-08 NOTE — Patient Instructions (Signed)

## 2014-09-08 NOTE — Progress Notes (Signed)
Subjective:    Patient ID: [name], female    DOB: 26-Jul-1960, 54 y.o.   MRN: [id number]  Patient here today for follow up. She reports she has had to take her migraine medications more than usual due to allergies, and feels that her asthma has been acting up related to allergies as well- she experiences SOB and wheezing 1-2 times a day when she goes outdoors. She denies taking any OTC medication for her allergies.   She reports she has been compliant with all medications except for her thyroid. She states she quit taking her levothyroxine over 6 months ago due to experiencing hair loss and she felt that it wasn't helping her.   Hyperlipidemia This is a chronic problem. The current episode started more than 1 year ago. The problem is uncontrolled. Recent lipid tests were reviewed and are variable. Exacerbating diseases include diabetes and hypothyroidism. Associated symptoms include shortness of breath (related to seasonal allergies). Pertinent negatives include no chest pain. Current antihyperlipidemic treatment includes statins. The current treatment provides moderate improvement of lipids. Compliance problems include adherence to diet and adherence to exercise.  Risk factors for coronary artery disease include diabetes mellitus, dyslipidemia, hypertension, obesity and post-menopausal.  Hypertension This is a chronic problem. The current episode started more than 1 year ago. The problem is uncontrolled. Associated symptoms include shortness of breath (related to seasonal allergies). Pertinent negatives include no chest pain or palpitations. Risk factors for coronary artery disease include diabetes mellitus, dyslipidemia, obesity and post-menopausal state. Past treatments include ACE inhibitors. The current treatment provides moderate improvement. Compliance problems include diet and exercise.  Hypertensive end-organ damage includes a thyroid problem.  Diabetes She presents for her  follow-up diabetic visit. She has type 2 diabetes mellitus. No MedicAlert identification noted. Her disease course has been stable. Pertinent negatives for hypoglycemia include no dizziness or seizures. Associated symptoms include fatigue. Pertinent negatives for diabetes include no chest pain and no visual change. Risk factors for coronary artery disease include diabetes mellitus, dyslipidemia, family history, hypertension and obesity. Current diabetic treatment includes oral agent (dual therapy). She is compliant with treatment all of the time. Her weight is stable. She is following a generally unhealthy diet. She has not had a previous visit with a dietitian. She rarely participates in exercise. (Not checking blood sugars) An ACE inhibitor/angiotensin II receptor blocker is being taken. She does not see a podiatrist.Eye exam is current.  Migraine  This is a chronic problem. The current episode started more than 1 year ago. The problem occurs seasonly. Pertinent negatives include no dizziness, numbness, seizures or visual change. The symptoms are aggravated by weather changes. Her past medical history is significant for hypertension.  Thyroid Problem Symptoms include fatigue. Patient reports no constipation, diaphoresis, diarrhea, menstrual problem, palpitations or visual change. Her past medical history is significant for diabetes and hyperlipidemia.    Review of Systems  Constitutional: Positive for fatigue. Negative for diaphoresis, activity change, appetite change and unexpected weight change.  Respiratory: Positive for shortness of breath (related to seasonal allergies) and wheezing.   Cardiovascular: Negative for chest pain and palpitations.  Gastrointestinal: Negative for diarrhea and constipation.  Genitourinary: Negative for menstrual problem.  Musculoskeletal: Negative.   Neurological: Negative for dizziness, seizures and numbness.  Psychiatric/Behavioral: Negative.   All other systems  reviewed and are negative.      Objective:   Physical Exam  Constitutional: She is oriented to person, place, and time. She appears well-developed and well-nourished. No distress.  HENT:  Right Ear: External ear normal.  Left Ear: External ear normal.  Nose: Nose normal.  Mouth/Throat: Oropharynx is clear and moist. No oropharyngeal exudate.  Eyes: Conjunctivae and EOM are normal. Pupils are equal, round, and reactive to light.  Neck: Trachea normal, normal range of motion and full passive range of motion without pain. Neck supple. No JVD present. Carotid bruit is not present. No thyromegaly present.  Cardiovascular: Normal rate, regular rhythm, normal heart sounds and intact distal pulses.  Exam reveals no gallop and no friction rub.   No murmur heard. Pulmonary/Chest: Effort normal. She has wheezes.  Abdominal: Soft. Bowel sounds are normal. She exhibits no distension and no mass. There is no tenderness.  Musculoskeletal: Normal range of motion.  Lymphadenopathy:    She has no cervical adenopathy.  Neurological: She is alert and oriented to person, place, and time. She has normal reflexes. She displays normal reflexes. No cranial nerve deficit.  Skin: Skin is warm and dry. She is not diaphoretic.  Psychiatric: She has a normal mood and affect. Her behavior is normal. Judgment and thought content normal.   BP 120/68 mmHg  Pulse 74  Temp(Src) 98 F (36.7 C) (Oral)  Ht 5' 4" (1.626 m)  Wt 240 lb (108.863 kg)  BMI 41.18 kg/m2   Results for orders placed or performed in visit on 09/08/14  POCT glycosylated hemoglobin (Hb A1C)  Result Value Ref Range   Hemoglobin A1C 9.7%        Assessment & Plan:   1. Hyperlipidemia Low fat diet - NMR, lipoprofile - atorvastatin (LIPITOR) 40 MG tablet; Take 1 tablet (40 mg total) by mouth daily.  Dispense: 30 tablet; Refill: 5  2. Essential hypertension Do not add slat to diet - CMP14+EGFR - benazepril (LOTENSIN) 20 MG tablet; Take 1  tablet (20 mg total) by mouth daily.  Dispense: 30 tablet; Refill: 5  3. Type 2 diabetes mellitus with diabetic polyneuropathy Added glucatrol to meds Strict carb counting - POCT glycosylated hemoglobin (Hb A1C) - glipiZIDE (GLUCOTROL) 10 MG tablet; Take 1 tablet (10 mg total) by mouth daily before breakfast.  Dispense: 30 tablet; Refill: 3 - Saxagliptin-Metformin (KOMBIGLYZE XR) 10-998 MG TB24; Take 1 tablet by mouth daily.  Dispense: 60 tablet; Refill: 5  4. Hypothyroidism, unspecified hypothyroidism type Will see what labs look like since patient has been off meds for several months  5. Depression Stress management - buPROPion (WELLBUTRIN XL) 150 MG 24 hr tablet; Take 3 tablets (450 mg total) by mouth daily.  Dispense: 90 tablet; Refill: 5  6. Intractable chronic migraine without aura and with status migrainosus - propranolol (INDERAL) 40 MG tablet; Take 1 tablet (40 mg total) by mouth 2 (two) times daily.  Dispense: 60 tablet; Refill: 5 - topiramate (TOPAMAX) 50 MG tablet; Take 1 tablet (50 mg total) by mouth 2 (two) times daily.  Dispense: 60 tablet; Refill: 5    Labs pending Health maintenance reviewed Diet and exercise encouraged Continue all meds Follow up  In 3 months   [place], FNP

## 2014-09-09 ENCOUNTER — Other Ambulatory Visit: Payer: Self-pay | Admitting: Nurse Practitioner

## 2014-09-09 LAB — CMP14+EGFR
ALK PHOS: 96 IU/L (ref 39–117)
ALT: 55 IU/L — AB (ref 0–32)
AST: 28 IU/L (ref 0–40)
Albumin/Globulin Ratio: 1.9 (ref 1.1–2.5)
Albumin: 4.6 g/dL (ref 3.5–5.5)
BUN/Creatinine Ratio: 27 — ABNORMAL HIGH (ref 9–23)
BUN: 18 mg/dL (ref 6–24)
Bilirubin Total: 0.3 mg/dL (ref 0.0–1.2)
CHLORIDE: 99 mmol/L (ref 97–108)
CO2: 21 mmol/L (ref 18–29)
CREATININE: 0.67 mg/dL (ref 0.57–1.00)
Calcium: 10.2 mg/dL (ref 8.7–10.2)
GFR calc non Af Amer: 101 mL/min/{1.73_m2} (ref >59)
GFR, EST AFRICAN AMERICAN: 116 mL/min/{1.73_m2} (ref >59)
GLUCOSE: 184 mg/dL — AB (ref 65–99)
Globulin, Total: 2.4 g/dL (ref 1.5–4.5)
Potassium: 4.4 mmol/L (ref 3.5–5.2)
Sodium: 139 mmol/L (ref 134–144)
TOTAL PROTEIN: 7 g/dL (ref 6.0–8.5)

## 2014-09-09 LAB — NMR, LIPOPROFILE
Cholesterol: 273 mg/dL — ABNORMAL HIGH (ref 100–199)
HDL Cholesterol by NMR: 37 mg/dL — ABNORMAL LOW (ref >39)
HDL PARTICLE NUMBER: 30 umol/L — AB (ref >=30.5)
LDL Particle Number: 2717 nmol/L — ABNORMAL HIGH (ref <1000)
LDL Size: 19.9 nm (ref >20.5)
LP-IR SCORE: 89 — AB (ref <=45)
Small LDL Particle Number: 1912 nmol/L — ABNORMAL HIGH (ref <=527)
Triglycerides by NMR: 530 mg/dL — ABNORMAL HIGH (ref 0–149)

## 2014-09-09 MED ORDER — BACLOFEN 10 MG PO TABS: 10.0000 mg | ORAL_TABLET | Freq: Three times a day (TID) | ORAL | Status: DC

## 2014-09-09 MED ORDER — FENOFIBRATE 160 MG PO TABS: 160.0000 mg | ORAL_TABLET | Freq: Every day | ORAL | Status: DC

## 2014-09-15 ENCOUNTER — Telehealth: Payer: Self-pay

## 2014-09-15 NOTE — Telephone Encounter (Signed)
Kombiglyze XR has been approved 08/15/14 to 09/14/15  [place]

## 2014-09-20 ENCOUNTER — Telehealth: Payer: Self-pay | Admitting: Nurse Practitioner

## 2014-09-20 NOTE — Telephone Encounter (Signed)
-----   Message from Chevis Pretty, [place] sent at 09/09/2014  4:20 PM EDT ----- Hgba1c discussed at appointment- added glipizide to meds Kidney and liver function stable ldl particle numbers elevated- needs to be on statin- lipitor rx sent to pharmacy Trig elevated fenofibrate rx snet to pharmacy Follow up in 3 months

## 2014-09-22 ENCOUNTER — Telehealth: Payer: Self-pay | Admitting: Nurse Practitioner

## 2014-09-22 NOTE — Telephone Encounter (Signed)
Detailed message left that a thyroid panel was not done at this visit.

## 2014-09-23 NOTE — Telephone Encounter (Signed)
Jackelyn Poling can you find out what insurance will cover in place of this.

## 2014-09-30 NOTE — Telephone Encounter (Signed)
Preferred drugs for her insurance are Janumet , Janumet XR and [facility] was prior authorized but her co pay is high because non preferred

## 2014-10-01 MED ORDER — SITAGLIPTIN PHOS-METFORMIN HCL 50-1000 MG PO TABS: 1.0000 | ORAL_TABLET | Freq: Two times a day (BID) | ORAL | Status: DC

## 2014-10-01 NOTE — Telephone Encounter (Signed)
Detailed message left for patient.

## 2014-10-01 NOTE — Telephone Encounter (Signed)
konbiglyze changed to janumet due to insurance

## 2014-11-12 ENCOUNTER — Encounter: Payer: Self-pay | Admitting: Physician Assistant

## 2014-11-12 ENCOUNTER — Ambulatory Visit (INDEPENDENT_AMBULATORY_CARE_PROVIDER_SITE_OTHER): Payer: BLUE CROSS/BLUE SHIELD | Admitting: Physician Assistant

## 2014-11-12 VITALS — BP 149/90 | HR 99 | Temp 98.5°F | Ht 64.0 in | Wt 228.0 lb

## 2014-11-12 DIAGNOSIS — R109 Unspecified abdominal pain: Secondary | ICD-10-CM | POA: Diagnosis not present

## 2014-11-12 DIAGNOSIS — K2970 Gastritis, unspecified, without bleeding: Secondary | ICD-10-CM

## 2014-11-12 DIAGNOSIS — R1110 Vomiting, unspecified: Secondary | ICD-10-CM

## 2014-11-12 LAB — POCT URINALYSIS DIPSTICK
Bilirubin, UA: NEGATIVE
Blood, UA: NEGATIVE
Glucose, UA: NEGATIVE
Ketones, UA: NEGATIVE
Leukocytes, UA: NEGATIVE
Nitrite, UA: NEGATIVE
Protein, UA: NEGATIVE
Spec Grav, UA: 1.01
Urobilinogen, UA: NEGATIVE
pH, UA: 6

## 2014-11-12 LAB — POCT CBC
Granulocyte percent: 62.6 %G (ref 37–80)
HCT, POC: 42.7 % (ref 37.7–47.9)
Hemoglobin: 13.6 g/dL (ref 12.2–16.2)
Lymph, poc: 1.6 (ref 0.6–3.4)
MCH, POC: 26.9 pg — AB (ref 27–31.2)
MCHC: 31.8 g/dL (ref 31.8–35.4)
MCV: 84.7 fL (ref 80–97)
MPV: 8.2 fL (ref 0–99.8)
POC Granulocyte: 3.4 (ref 2–6.9)
POC LYMPH PERCENT: 29.9 %L (ref 10–50)
Platelet Count, POC: 210 10*3/uL (ref 142–424)
RBC: 5.05 M/uL (ref 4.04–5.48)
RDW, POC: 13.8 %
WBC: 5.4 10*3/uL (ref 4.6–10.2)

## 2014-11-12 LAB — POCT UA - MICROSCOPIC ONLY
BACTERIA, U MICROSCOPIC: NEGATIVE
Casts, Ur, LPF, POC: NEGATIVE
Crystals, Ur, HPF, POC: NEGATIVE
MUCUS UA: NEGATIVE
RBC, URINE, MICROSCOPIC: NEGATIVE
Yeast, UA: NEGATIVE

## 2014-11-12 LAB — POCT INFLUENZA A/B
Influenza A, POC: NEGATIVE
Influenza B, POC: NEGATIVE

## 2014-11-12 MED ORDER — ONDANSETRON HCL 4 MG PO TABS: 4.0000 mg | ORAL_TABLET | Freq: Three times a day (TID) | ORAL | Status: DC | PRN

## 2014-11-12 NOTE — Patient Instructions (Signed)
Food Choices to Help Relieve Diarrhea °When you have diarrhea, the foods you eat and your eating habits are very important. Choosing the right foods and drinks can help relieve diarrhea. Also, because diarrhea can last up to 7 days, you need to replace lost fluids and electrolytes (such as sodium, potassium, and chloride) in order to help prevent dehydration.  °WHAT GENERAL GUIDELINES DO I NEED TO FOLLOW? °· Slowly drink 1 cup (8 oz) of fluid for each episode of diarrhea. If you are getting enough fluid, your urine will be clear or pale yellow. °· Eat starchy foods. Some good choices include white rice, white toast, pasta, low-fiber cereal, baked potatoes (without the skin), saltine crackers, and bagels. °· Avoid large servings of any cooked vegetables. °· Limit fruit to two servings per day. A serving is ½ cup or 1 small piece. °· Choose foods with less than 2 g of fiber per serving. °· Limit fats to less than 8 tsp (38 g) per day. °· Avoid fried foods. °· Eat foods that have probiotics in them. Probiotics can be found in certain dairy products. °· Avoid foods and beverages that may increase the speed at which food moves through the stomach and intestines (gastrointestinal tract). Things to avoid include: °¨ High-fiber foods, such as dried fruit, raw fruits and vegetables, nuts, seeds, and whole grain foods. °¨ Spicy foods and high-fat foods. °¨ Foods and beverages sweetened with high-fructose corn syrup, honey, or sugar alcohols such as xylitol, sorbitol, and mannitol. °WHAT FOODS ARE RECOMMENDED? °Grains °White rice. White, French, or pita breads (fresh or toasted), including plain rolls, buns, or bagels. White pasta. Saltine, soda, or graham crackers. Pretzels. Low-fiber cereal. Cooked cereals made with water (such as cornmeal, farina, or cream cereals). Plain muffins. Matzo. Melba toast. Zwieback.  °Vegetables °Potatoes (without the skin). Strained tomato and vegetable juices. Most well-cooked and canned  vegetables without seeds. Tender lettuce. °Fruits °Cooked or canned applesauce, apricots, cherries, fruit cocktail, grapefruit, peaches, pears, or plums. Fresh bananas, apples without skin, cherries, grapes, cantaloupe, grapefruit, peaches, oranges, or plums.  °Meat and Other Protein Products °Baked or boiled chicken. Eggs. Tofu. Fish. Seafood. Smooth peanut butter. Ground or well-cooked tender beef, ham, veal, lamb, pork, or poultry.  °Dairy °Plain yogurt, kefir, and unsweetened liquid yogurt. Lactose-free milk, buttermilk, or soy milk. Plain hard cheese. °Beverages °Sport drinks. Clear broths. Diluted fruit juices (except prune). Regular, caffeine-free sodas such as ginger ale. Water. Decaffeinated teas. Oral rehydration solutions. Sugar-free beverages not sweetened with sugar alcohols. °Other °Bouillon, broth, or soups made from recommended foods.  °The items listed above may not be a complete list of recommended foods or beverages. Contact your dietitian for more options. °WHAT FOODS ARE NOT RECOMMENDED? °Grains °Whole grain, whole wheat, bran, or rye breads, rolls, pastas, crackers, and cereals. Wild or brown rice. Cereals that contain more than 2 g of fiber per serving. Corn tortillas or taco shells. Cooked or dry oatmeal. Granola. Popcorn. °Vegetables °Raw vegetables. Cabbage, broccoli, Brussels sprouts, artichokes, baked beans, beet greens, corn, kale, legumes, peas, sweet potatoes, and yams. Potato skins. Cooked spinach and cabbage. °Fruits °Dried fruit, including raisins and dates. Raw fruits. Stewed or dried prunes. Fresh apples with skin, apricots, mangoes, pears, raspberries, and strawberries.  °Meat and Other Protein Products °Chunky peanut butter. Nuts and seeds. Beans and lentils. Bacon.  °Dairy °High-fat cheeses. Milk, chocolate milk, and beverages made with milk, such as milk shakes. Cream. Ice cream. °Sweets and Desserts °Sweet rolls, doughnuts, and sweet breads. Pancakes   and waffles. °Fats and  Oils °Butter. Cream sauces. Margarine. Salad oils. Plain salad dressings. Olives. Avocados.  °Beverages °Caffeinated beverages (such as coffee, tea, soda, or energy drinks). Alcoholic beverages. Fruit juices with pulp. Prune juice. Soft drinks sweetened with high-fructose corn syrup or sugar alcohols. °Other °Coconut. Hot sauce. Chili powder. Mayonnaise. Gravy. Cream-based or milk-based soups.  °The items listed above may not be a complete list of foods and beverages to avoid. Contact your dietitian for more information. °WHAT SHOULD I DO IF I BECOME DEHYDRATED? °Diarrhea can sometimes lead to dehydration. Signs of dehydration include dark urine and dry mouth and skin. If you think you are dehydrated, you should rehydrate with an oral rehydration solution. These solutions can be purchased at pharmacies, retail stores, or online.  °Drink ½-1 cup (120-240 mL) of oral rehydration solution each time you have an episode of diarrhea. If drinking this amount makes your diarrhea worse, try drinking smaller amounts more often. For example, drink 1-3 tsp (5-15 mL) every 5-10 minutes.  °A general rule for staying hydrated is to drink 1½-2 L of fluid per day. Talk to your health care provider about the specific amount you should be drinking each day. Drink enough fluids to keep your urine clear or pale yellow. °Document Released: 08/18/2003 Document Revised: 06/02/2013 Document Reviewed: 04/20/2013 °ExitCare® Patient Information ©2015 [employer]. This information is not intended to replace advice given to you by your health care provider. Make sure you discuss any questions you have with your health care provider. ° °Nausea and Vomiting °Nausea is a sick feeling that often comes before throwing up (vomiting). Vomiting is a reflex where stomach contents come out of your mouth. Vomiting can cause severe loss of body fluids (dehydration). Children and elderly adults can become dehydrated quickly, especially if they also have  diarrhea. Nausea and vomiting are symptoms of a condition or disease. It is important to find the cause of your symptoms. °CAUSES  °· Direct irritation of the stomach lining. This irritation can result from increased acid production (gastroesophageal reflux disease), infection, food poisoning, taking certain medicines (such as nonsteroidal anti-inflammatory drugs), alcohol use, or tobacco use. °· Signals from the brain. These signals could be caused by a headache, heat exposure, an inner ear disturbance, increased pressure in the brain from injury, infection, a tumor, or a concussion, pain, emotional stimulus, or metabolic problems. °· An obstruction in the gastrointestinal tract (bowel obstruction). °· Illnesses such as diabetes, hepatitis, gallbladder problems, appendicitis, kidney problems, cancer, sepsis, atypical symptoms of a heart attack, or eating disorders. °· Medical treatments such as chemotherapy and radiation. °· Receiving medicine that makes you sleep (general anesthetic) during surgery. °DIAGNOSIS °Your caregiver may ask for tests to be done if the problems do not improve after a few days. Tests may also be done if symptoms are severe or if the reason for the nausea and vomiting is not clear. Tests may include: °· Urine tests. °· Blood tests. °· Stool tests. °· Cultures (to look for evidence of infection). °· X-rays or other imaging studies. °Test results can help your caregiver make decisions about treatment or the need for additional tests. °TREATMENT °You need to stay well hydrated. Drink frequently but in small amounts. You may wish to drink water, sports drinks, clear broth, or eat frozen ice pops or gelatin dessert to help stay hydrated. When you eat, eating slowly may help prevent nausea. There are also some antinausea medicines that may help prevent nausea. °HOME CARE INSTRUCTIONS  °· Take all medicine as   directed by your caregiver. °· If you do not have an appetite, do not force yourself to  eat. However, you must continue to drink fluids. °· If you have an appetite, eat a normal diet unless your caregiver tells you differently. °¨ Eat a variety of complex carbohydrates (rice, wheat, potatoes, bread), lean meats, yogurt, fruits, and vegetables. °¨ Avoid high-fat foods because they are more difficult to digest. °· Drink enough water and fluids to keep your urine clear or pale yellow. °· If you are dehydrated, ask your caregiver for specific rehydration instructions. Signs of dehydration may include: °¨ Severe thirst. °¨ Dry lips and mouth. °¨ Dizziness. °¨ Dark urine. °¨ Decreasing urine frequency and amount. °¨ Confusion. °¨ Rapid breathing or pulse. °SEEK IMMEDIATE MEDICAL CARE IF:  °· You have blood or brown flecks (like coffee grounds) in your vomit. °· You have black or bloody stools. °· You have a severe headache or stiff neck. °· You are confused. °· You have severe abdominal pain. °· You have chest pain or trouble breathing. °· You do not urinate at least once every 8 hours. °· You develop cold or clammy skin. °· You continue to vomit for longer than 24 to 48 hours. °· You have a fever. °MAKE SURE YOU:  °· Understand these instructions. °· Will watch your condition. °· Will get help right away if you are not doing well or get worse. °Document Released: 05/28/2005 Document Revised: 08/20/2011 Document Reviewed: 10/25/2010 °ExitCare® Patient Information ©2015 [employer]. This information is not intended to replace advice given to you by your health care provider. Make sure you discuss any questions you have with your health care provider. ° °

## 2014-11-12 NOTE — Progress Notes (Signed)
Subjective:    Patient ID: [name], female    DOB: 1961/02/04, 54 y.o.   MRN: [id number]  HPI 54 y/o female presents with c/o increased fatigue, nausea/vomiting x 2 days. Diarrhea. Has not been able to keep anything down.     Review of Systems  Constitutional: Positive for chills, diaphoresis, appetite change (decreased ) and fatigue.  HENT: Negative.   Respiratory: Negative.   Cardiovascular: Negative.   Gastrointestinal: Positive for nausea, vomiting (last episode was yesterday ), abdominal pain (bilateral lower quadrant better with sleep ) and diarrhea (5-6 bowel movenents daily ). Negative for constipation and blood in stool.  Genitourinary: Negative.   Neurological: Positive for weakness.       Objective:   Physical Exam  Constitutional: She is oriented to person, place, and time. She appears well-developed and well-nourished. No distress.  Obese    Cardiovascular: Normal rate, regular rhythm, normal heart sounds and intact distal pulses.   Pulmonary/Chest: Effort normal and breath sounds normal.  Abdominal: Soft. Bowel sounds are normal. She exhibits no distension and no mass. There is tenderness (generalized ). There is no rebound and no guarding.  Neurological: She is alert and oriented to person, place, and time.  Skin: She is not diaphoretic.  Nursing note and vitals reviewed.  Results for orders placed or performed in visit on 11/12/14  POCT Influenza A/B  Result Value Ref Range   Influenza A, POC Negative    Influenza B, POC Negative   POCT CBC  Result Value Ref Range   WBC 5.4 4.6 - 10.2 K/uL   Lymph, poc 1.6 0.6 - 3.4   POC LYMPH PERCENT 29.9 10 - 50 %L   MID (cbc)  0 - 0.9   POC MID %  0 - 12 %M   POC Granulocyte 3.4 2 - 6.9   Granulocyte percent 62.6 37 - 80 %G   RBC 5.05 4.04 - 5.48 M/uL   Hemoglobin 13.6 12.2 - 16.2 g/dL   HCT, POC 42.7 37.7 - 47.9 %   MCV 84.7 80 - 97 fL   MCH, POC 26.9 (A) 27 - 31.2 pg   MCHC 31.8 31.8 - 35.4 g/dL     RDW, POC 13.8 %   Platelet Count, POC 210 142 - 424 K/uL   MPV 8.2 0 - 99.8 fL  POCT urinalysis dipstick  Result Value Ref Range   Color, UA gold    Clarity, UA clear    Glucose, UA neg    Bilirubin, UA neg    Ketones, UA neg    Spec Grav, UA 1.010    Blood, UA neg    pH, UA 6.0    Protein, UA neg    Urobilinogen, UA negative    Nitrite, UA neg    Leukocytes, UA Negative   POCT UA - Microscopic Only  Result Value Ref Range   WBC, Ur, HPF, POC rare    RBC, urine, microscopic neg    Bacteria, U Microscopic neg    Mucus, UA neg    Epithelial cells, urine per micros rare    Crystals, Ur, HPF, POC neg    Casts, Ur, LPF, POC neg    Yeast, UA neg    Filed Vitals:   11/12/14 1132  BP: 149/90  Pulse: 99  Temp: 98.5 F (36.9 C)          Assessment & Plan:  1. Vomiting in adult  - POCT Influenza A/B - POCT CBC -  ondansetron (ZOFRAN) 4 MG tablet; Take 1 tablet (4 mg total) by mouth every 8 (eight) hours as needed for nausea or vomiting.  Dispense: 20 tablet; Refill: 0  2. Abdominal cramping  - POCT urinalysis dipstick - POCT UA - Microscopic Only - ondansetron (ZOFRAN) 4 MG tablet; Take 1 tablet (4 mg total) by mouth every 8 (eight) hours as needed for nausea or vomiting.  Dispense: 20 tablet; Refill: 0  3. Viral gastritis - Instructions given for proper diet  - ondansetron (ZOFRAN) 4 MG tablet; Take 1 tablet (4 mg total) by mouth every 8 (eight) hours as needed for nausea or vomiting.  Dispense: 20 tablet; Refill: 0   Continue all meds Labs pending Health Maintenance reviewed Diet and exercise encouraged RTC prn  Avoid caffeine sugar   Emri Sample A. Benjamin Stain PA-C

## 2014-12-11 ENCOUNTER — Other Ambulatory Visit: Payer: Self-pay | Admitting: Nurse Practitioner

## 2014-12-22 ENCOUNTER — Ambulatory Visit: Payer: BLUE CROSS/BLUE SHIELD | Admitting: Nurse Practitioner

## 2015-01-07 ENCOUNTER — Other Ambulatory Visit: Payer: Self-pay | Admitting: Nurse Practitioner

## 2015-02-08 ENCOUNTER — Encounter: Payer: Self-pay | Admitting: Nurse Practitioner

## 2015-02-08 ENCOUNTER — Ambulatory Visit (INDEPENDENT_AMBULATORY_CARE_PROVIDER_SITE_OTHER): Payer: BLUE CROSS/BLUE SHIELD | Admitting: Nurse Practitioner

## 2015-02-08 VITALS — BP 134/74 | HR 74 | Temp 97.1°F | Ht 64.0 in | Wt 232.0 lb

## 2015-02-08 DIAGNOSIS — G43111 Migraine with aura, intractable, with status migrainosus: Secondary | ICD-10-CM | POA: Diagnosis not present

## 2015-02-08 MED ORDER — KETOROLAC TROMETHAMINE 60 MG/2ML IM SOLN
60.0000 mg | Freq: Once | INTRAMUSCULAR | Status: AC
  Administered 2015-02-08: 60 mg via INTRAMUSCULAR

## 2015-02-08 NOTE — Progress Notes (Signed)
   Subjective:    Patient ID: [name], female    DOB: 1961-03-17, 54 y.o.   MRN: [id number]  HPI  * Pt reports a migraine that started Sunday night, with nausea/vomiting. Reports she takes topamax on a regular basis and took the baclofen on Sunday but vomited up the medication. States she has been under increased stress recently related to a new job. Pain 9/10. Prior to today the pain has been worse    Review of Systems  Constitutional: Negative.   Eyes: Negative.   Respiratory: Negative.   Cardiovascular: Negative.   Neurological: Positive for dizziness and headaches. Negative for syncope, weakness and numbness.       Objective:   Physical Exam  Constitutional: She is oriented to person, place, and time. She appears well-developed and well-nourished.  HENT:  Head: Normocephalic and atraumatic.  Right Ear: External ear normal.  Left Ear: External ear normal.  Mouth/Throat: Oropharynx is clear and moist.  Eyes: Pupils are equal, round, and reactive to light.  Neck: Normal range of motion.  Cardiovascular: Normal rate, regular rhythm, normal heart sounds and intact distal pulses.   Pulmonary/Chest: Effort normal and breath sounds normal.  Neurological: She is alert and oriented to person, place, and time. She has normal reflexes. No cranial nerve deficit.  Psychiatric: She has a normal mood and affect. Her behavior is normal.      BP 134/74 mmHg  Pulse 74  Temp(Src) 97.1 F (36.2 C) (Oral)  Ht 5\' 4"  (1.626 m)  Wt 232 lb (105.235 kg)  BMI 39.80 kg/m2     Assessment & Plan:  1. Intractable migraine with aura with status migrainosus *rest in dark room RTO prn** - ketorolac (TORADOL) injection 60 mg; Inject 2 mLs (60 mg total) into the muscle once.  Mary-Margaret Hassell Done, FNP

## 2015-02-22 ENCOUNTER — Ambulatory Visit: Payer: BLUE CROSS/BLUE SHIELD | Admitting: Nurse Practitioner

## 2015-03-31 ENCOUNTER — Ambulatory Visit (INDEPENDENT_AMBULATORY_CARE_PROVIDER_SITE_OTHER): Payer: BLUE CROSS/BLUE SHIELD | Admitting: Nurse Practitioner

## 2015-03-31 ENCOUNTER — Encounter: Payer: Self-pay | Admitting: Nurse Practitioner

## 2015-03-31 VITALS — BP 147/77 | HR 78 | Temp 97.6°F | Ht 64.0 in | Wt 239.0 lb

## 2015-03-31 DIAGNOSIS — G43711 Chronic migraine without aura, intractable, with status migrainosus: Secondary | ICD-10-CM

## 2015-03-31 DIAGNOSIS — F32A Depression, unspecified: Secondary | ICD-10-CM

## 2015-03-31 DIAGNOSIS — R1110 Vomiting, unspecified: Secondary | ICD-10-CM | POA: Diagnosis not present

## 2015-03-31 DIAGNOSIS — E1142 Type 2 diabetes mellitus with diabetic polyneuropathy: Secondary | ICD-10-CM | POA: Diagnosis not present

## 2015-03-31 DIAGNOSIS — K2970 Gastritis, unspecified, without bleeding: Secondary | ICD-10-CM | POA: Diagnosis not present

## 2015-03-31 DIAGNOSIS — F411 Generalized anxiety disorder: Secondary | ICD-10-CM

## 2015-03-31 DIAGNOSIS — R109 Unspecified abdominal pain: Secondary | ICD-10-CM | POA: Diagnosis not present

## 2015-03-31 DIAGNOSIS — E785 Hyperlipidemia, unspecified: Secondary | ICD-10-CM | POA: Diagnosis not present

## 2015-03-31 DIAGNOSIS — Z23 Encounter for immunization: Secondary | ICD-10-CM | POA: Diagnosis not present

## 2015-03-31 DIAGNOSIS — I10 Essential (primary) hypertension: Secondary | ICD-10-CM

## 2015-03-31 DIAGNOSIS — F329 Major depressive disorder, single episode, unspecified: Secondary | ICD-10-CM | POA: Diagnosis not present

## 2015-03-31 LAB — POCT GLYCOSYLATED HEMOGLOBIN (HGB A1C): Hemoglobin A1C: 7.6

## 2015-03-31 MED ORDER — ATORVASTATIN CALCIUM 40 MG PO TABS: 40.0000 mg | ORAL_TABLET | Freq: Every day | ORAL | Status: DC

## 2015-03-31 MED ORDER — PROPRANOLOL HCL 40 MG PO TABS: 40.0000 mg | ORAL_TABLET | Freq: Two times a day (BID) | ORAL | Status: DC

## 2015-03-31 MED ORDER — FENOFIBRATE 160 MG PO TABS: 160.0000 mg | ORAL_TABLET | Freq: Every day | ORAL | Status: DC

## 2015-03-31 MED ORDER — INFLUENZA VAC SPLIT QUAD 0.5 ML IM SUSY: 0.5000 mL | PREFILLED_SYRINGE | INTRAMUSCULAR | Status: DC

## 2015-03-31 MED ORDER — BUPROPION HCL ER (XL) 150 MG PO TB24: 450.0000 mg | ORAL_TABLET | Freq: Every day | ORAL | Status: DC

## 2015-03-31 MED ORDER — TOPIRAMATE 50 MG PO TABS: 50.0000 mg | ORAL_TABLET | Freq: Two times a day (BID) | ORAL | Status: DC

## 2015-03-31 MED ORDER — SITAGLIPTIN PHOS-METFORMIN HCL 50-1000 MG PO TABS: 1.0000 | ORAL_TABLET | Freq: Two times a day (BID) | ORAL | Status: DC

## 2015-03-31 MED ORDER — INFLUENZA VAC SPLIT QUAD 0.5 ML IM SUSY
0.5000 mL | PREFILLED_SYRINGE | Freq: Once | INTRAMUSCULAR | Status: AC
  Administered 2015-03-31: 0.5 mL via INTRAMUSCULAR

## 2015-03-31 MED ORDER — GLIPIZIDE 10 MG PO TABS: ORAL_TABLET | ORAL | Status: DC

## 2015-03-31 MED ORDER — BENAZEPRIL HCL 20 MG PO TABS: 20.0000 mg | ORAL_TABLET | Freq: Every day | ORAL | Status: DC

## 2015-03-31 NOTE — Progress Notes (Signed)
Subjective:    Patient ID: [name], female    DOB: 10-31-1960, 54 y.o.   MRN: [id number]  HPI    Review of Systems     Objective:   Physical Exam        Assessment & Plan:    Subjective:    Patient ID: [name], female    DOB: [birth year], 54 y.o.   MRN: [id number]  Patient here today for follow up. She says she is doing well today without complaints.  Hyperlipidemia This is a chronic problem. The current episode started more than 1 year ago. The problem is uncontrolled. Recent lipid tests were reviewed and are variable. Exacerbating diseases include diabetes and hypothyroidism. Associated symptoms include shortness of breath (related to seasonal allergies). Pertinent negatives include no chest pain. Current antihyperlipidemic treatment includes statins. The current treatment provides moderate improvement of lipids. Compliance problems include adherence to diet and adherence to exercise.  Risk factors for coronary artery disease include diabetes mellitus, dyslipidemia, hypertension, obesity and post-menopausal.  Hypertension This is a chronic problem. The current episode started more than 1 year ago. The problem is uncontrolled. Associated symptoms include shortness of breath (related to seasonal allergies). Pertinent negatives include no chest pain or palpitations. Risk factors for coronary artery disease include diabetes mellitus, dyslipidemia, obesity and post-menopausal state. Past treatments include ACE inhibitors. The current treatment provides moderate improvement. Compliance problems include diet and exercise.  Hypertensive end-organ damage includes a thyroid problem.  Diabetes She presents for her follow-up diabetic visit. She has type 2 diabetes mellitus. No MedicAlert identification noted. Her disease course has been stable. Pertinent negatives for hypoglycemia include no dizziness or seizures. Associated symptoms include fatigue. Pertinent negatives  for diabetes include no chest pain and no visual change. Risk factors for coronary artery disease include diabetes mellitus, dyslipidemia, family history, hypertension and obesity. Current diabetic treatment includes oral agent (dual therapy). She is compliant with treatment all of the time. Her weight is stable. She is following a generally unhealthy diet. She has not had a previous visit with a dietitian. She rarely participates in exercise. (Not checking blood sugars) An ACE inhibitor/angiotensin II receptor blocker is being taken. She does not see a podiatrist.Eye exam is current.  Migraine  This is a chronic problem. The current episode started more than 1 year ago. The problem occurs seasonly. Pertinent negatives include no dizziness, numbness, seizures or visual change. The symptoms are aggravated by weather changes. Her past medical history is significant for hypertension.  Thyroid Problem Symptoms include fatigue. Patient reports no constipation, diaphoresis, diarrhea, menstrual problem, palpitations or visual change. Her past medical history is significant for diabetes and hyperlipidemia.    Review of Systems  Constitutional: Positive for fatigue. Negative for diaphoresis, activity change, appetite change and unexpected weight change.  Respiratory: Positive for shortness of breath (related to seasonal allergies) and wheezing.   Cardiovascular: Negative for chest pain and palpitations.  Gastrointestinal: Negative for diarrhea and constipation.  Genitourinary: Negative for menstrual problem.  Musculoskeletal: Negative.   Neurological: Negative for dizziness, seizures and numbness.  Psychiatric/Behavioral: Negative.   All other systems reviewed and are negative.      Objective:   Physical Exam  Constitutional: She is oriented to person, place, and time. She appears well-developed and well-nourished. No distress.  HENT:  Right Ear: External ear normal.  Left Ear: External ear normal.    Nose: Nose normal.  Mouth/Throat: Oropharynx is clear and moist. No oropharyngeal exudate.  Eyes: Conjunctivae  and EOM are normal. Pupils are equal, round, and reactive to light.  Neck: Trachea normal, normal range of motion and full passive range of motion without pain. Neck supple. No JVD present. Carotid bruit is not present. No thyromegaly present.  Cardiovascular: Normal rate, regular rhythm, normal heart sounds and intact distal pulses.  Exam reveals no gallop and no friction rub.   No murmur heard. Pulmonary/Chest: Effort normal. She has wheezes.  Abdominal: Soft. Bowel sounds are normal. She exhibits no distension and no mass. There is no tenderness.  Musculoskeletal: Normal range of motion.  Lymphadenopathy:    She has no cervical adenopathy.  Neurological: She is alert and oriented to person, place, and time. She has normal reflexes. She displays normal reflexes. No cranial nerve deficit.  Skin: Skin is warm and dry. She is not diaphoretic.  Psychiatric: She has a normal mood and affect. Her behavior is normal. Judgment and thought content normal.   BP 147/77 mmHg  Pulse 78  Temp(Src) 97.6 F (36.4 C) (Oral)  Ht _0  (1.626 m)  Wt 239 lb (108.41 kg)  BMI 41.00 kg/m2   Results for orders placed or performed in visit on 03/31/15  POCT glycosylated hemoglobin (Hb A1C)  Result Value Ref Range   Hemoglobin A1C 7.6        Assessment & Plan:   1. Essential hypertension   2. Type 2 diabetes mellitus with diabetic polyneuropathy, without long-term current use of insulin ([place])   3. Anxiety state   4. Depression   5. Hyperlipidemia   6. Morbid obesity, unspecified obesity type ([place])   7. Need for immunization against influenza   8. Vomiting in adult   9. Abdominal cramping   10. Viral gastritis   11. Intractable chronic migraine without aura and with status migrainosus    Meds ordered this encounter  Medications  . Influenza vac split quadrivalent PF (FLUARIX)  injection 0.5 mL    Sig:   . buPROPion (WELLBUTRIN XL) 150 MG 24 hr tablet    Sig: Take 3 tablets (450 mg total) by mouth daily.    Dispense:  90 tablet    Refill:  5    Order Specific Question:  Supervising Provider    Answer:  Chipper Herb [[id number]]  . benazepril (LOTENSIN) 20 MG tablet    Sig: Take 1 tablet (20 mg total) by mouth daily.    Dispense:  30 tablet    Refill:  5    Order Specific Question:  Supervising Provider    Answer:  Chipper Herb [[id number]]  . atorvastatin (LIPITOR) 40 MG tablet    Sig: Take 1 tablet (40 mg total) by mouth daily.    Dispense:  30 tablet    Refill:  5    Order Specific Question:  Supervising Provider    Answer:  Chipper Herb [[id number]]  . topiramate (TOPAMAX) 50 MG tablet    Sig: Take 1 tablet (50 mg total) by mouth 2 (two) times daily.    Dispense:  60 tablet    Refill:  5    Order Specific Question:  Supervising Provider    Answer:  Chipper Herb [[id number]]  . propranolol (INDERAL) 40 MG tablet    Sig: Take 1 tablet (40 mg total) by mouth 2 (two) times daily.    Dispense:  60 tablet    Refill:  5    Order Specific Question:  Supervising Provider    Answer:  Chipper Herb [[id number]]  .  sitaGLIPtin-metformin (JANUMET) 50-1000 MG tablet    Sig: Take 1 tablet by mouth 2 (two) times daily with a meal.    Dispense:  60 tablet    Refill:  5    Order Specific Question:  Supervising Provider    Answer:  Chipper Herb [[id number]]  . glipiZIDE (GLUCOTROL) 10 MG tablet    Sig: TAKE ONE TABLET BY MOUTH EVERY DAY BEFORE BREAKFAST    Dispense:  30 tablet    Refill:  5    Order Specific Question:  Supervising Provider    Answer:  Chipper Herb [[id number]]  . fenofibrate 160 MG tablet    Sig: Take 1 tablet (160 mg total) by mouth daily.    Dispense:  30 tablet    Refill:  5    Order Specific Question:  Supervising Provider    Answer:  Chipper Herb [[id number]]   Orders Placed This Encounter  Procedures  . CMP14+EGFR  . Lipid panel  . POCT glycosylated  hemoglobin (Hb A1C)   Flu shot today Reviewed health maintenance Follow up in 3 months Discussed diet and exercise for person with BMI >25 Will recheck weight in 3-6 months  Mary-Margaret Hassell Done, FNP

## 2015-03-31 NOTE — Patient Instructions (Signed)
Health Maintenance, Female Adopting a healthy lifestyle and getting preventive care can go a long way to promote health and wellness. Talk with your health care provider about what schedule of regular examinations is right for you. This is a good chance for you to check in with your provider about disease prevention and staying healthy. In between checkups, there are plenty of things you can do on your own. Experts have done a lot of research about which lifestyle changes and preventive measures are most likely to keep you healthy. Ask your health care provider for more information. WEIGHT AND DIET  Eat a healthy diet  Be sure to include plenty of vegetables, fruits, low-fat dairy products, and lean protein.  Do not eat a lot of foods high in solid fats, added sugars, or salt.  Get regular exercise. This is one of the most important things you can do for your health.  Most adults should exercise for at least 150 minutes each week. The exercise should increase your heart rate and make you sweat (moderate-intensity exercise).  Most adults should also do strengthening exercises at least twice a week. This is in addition to the moderate-intensity exercise.  Maintain a healthy weight  Body mass index (BMI) is a measurement that can be used to identify possible weight problems. It estimates body fat based on height and weight. Your health care provider can help determine your BMI and help you achieve or maintain a healthy weight.  For females 20 years of age and older:   A BMI below 18.5 is considered underweight.  A BMI of 18.5 to 24.9 is normal.  A BMI of 25 to 29.9 is considered overweight.  A BMI of 30 and above is considered obese.  Watch levels of cholesterol and blood lipids  You should start having your blood tested for lipids and cholesterol at 54 years of age, then have this test every 5 years.  You may need to have your cholesterol levels checked more often if:  Your lipid  or cholesterol levels are high.  You are older than 54 years of age.  You are at high risk for heart disease.  CANCER SCREENING   Lung Cancer  Lung cancer screening is recommended for adults 55-80 years old who are at high risk for lung cancer because of a history of smoking.  A yearly low-dose CT scan of the lungs is recommended for people who:  Currently smoke.  Have quit within the past 15 years.  Have at least a 30-pack-year history of smoking. A pack year is smoking an average of one pack of cigarettes a day for 1 year.  Yearly screening should continue until it has been 15 years since you quit.  Yearly screening should stop if you develop a health problem that would prevent you from having lung cancer treatment.  Breast Cancer  Practice breast self-awareness. This means understanding how your breasts normally appear and feel.  It also means doing regular breast self-exams. Let your health care provider know about any changes, no matter how small.  If you are in your 20s or 30s, you should have a clinical breast exam (CBE) by a health care provider every 1-3 years as part of a regular health exam.  If you are 40 or older, have a CBE every year. Also consider having a breast X-ray (mammogram) every year.  If you have a family history of breast cancer, talk to your health care provider about genetic screening.  If you   are at high risk for breast cancer, talk to your health care provider about having an MRI and a mammogram every year.  Breast cancer gene (BRCA) assessment is recommended for women who have family members with BRCA-related cancers. BRCA-related cancers include:  Breast.  Ovarian.  Tubal.  Peritoneal cancers.  Results of the assessment will determine the need for genetic counseling and BRCA1 and BRCA2 testing. Cervical Cancer Your health care provider may recommend that you be screened regularly for cancer of the pelvic organs (ovaries, uterus, and  vagina). This screening involves a pelvic examination, including checking for microscopic changes to the surface of your cervix (Pap test). You may be encouraged to have this screening done every 3 years, beginning at age 21.  For women ages 30-65, health care providers may recommend pelvic exams and Pap testing every 3 years, or they may recommend the Pap and pelvic exam, combined with testing for human papilloma virus (HPV), every 5 years. Some types of HPV increase your risk of cervical cancer. Testing for HPV may also be done on women of any age with unclear Pap test results.  Other health care providers may not recommend any screening for nonpregnant women who are considered low risk for pelvic cancer and who do not have symptoms. Ask your health care provider if a screening pelvic exam is right for you.  If you have had past treatment for cervical cancer or a condition that could lead to cancer, you need Pap tests and screening for cancer for at least 20 years after your treatment. If Pap tests have been discontinued, your risk factors (such as having a new sexual partner) need to be reassessed to determine if screening should resume. Some women have medical problems that increase the chance of getting cervical cancer. In these cases, your health care provider may recommend more frequent screening and Pap tests. Colorectal Cancer  This type of cancer can be detected and often prevented.  Routine colorectal cancer screening usually begins at 54 years of age and continues through 54 years of age.  Your health care provider may recommend screening at an earlier age if you have risk factors for colon cancer.  Your health care provider may also recommend using home test kits to check for hidden blood in the stool.  A small camera at the end of a tube can be used to examine your colon directly (sigmoidoscopy or colonoscopy). This is done to check for the earliest forms of colorectal  cancer.  Routine screening usually begins at age 50.  Direct examination of the colon should be repeated every 5-10 years through 54 years of age. However, you may need to be screened more often if early forms of precancerous polyps or small growths are found. Skin Cancer  Check your skin from head to toe regularly.  Tell your health care provider about any new moles or changes in moles, especially if there is a change in a mole's shape or color.  Also tell your health care provider if you have a mole that is larger than the size of a pencil eraser.  Always use sunscreen. Apply sunscreen liberally and repeatedly throughout the day.  Protect yourself by wearing long sleeves, pants, a wide-brimmed hat, and sunglasses whenever you are outside. HEART DISEASE, DIABETES, AND HIGH BLOOD PRESSURE   High blood pressure causes heart disease and increases the risk of stroke. High blood pressure is more likely to develop in:  People who have blood pressure in the high end   of the normal range (130-139/85-89 mm Hg).  People who are overweight or obese.  People who are African American.  If you are 38-23 years of age, have your blood pressure checked every 3-5 years. If you are 61 years of age or older, have your blood pressure checked every year. You should have your blood pressure measured twice--once when you are at a hospital or clinic, and once when you are not at a hospital or clinic. Record the average of the two measurements. To check your blood pressure when you are not at a hospital or clinic, you can use:  An automated blood pressure machine at a pharmacy.  A home blood pressure monitor.  If you are between 45 years and 39 years old, ask your health care provider if you should take aspirin to prevent strokes.  Have regular diabetes screenings. This involves taking a blood sample to check your fasting blood sugar level.  If you are at a normal weight and have a low risk for diabetes,  have this test once every three years after 54 years of age.  If you are overweight and have a high risk for diabetes, consider being tested at a younger age or more often. PREVENTING INFECTION  Hepatitis B  If you have a higher risk for hepatitis B, you should be screened for this virus. You are considered at high risk for hepatitis B if:  You were born in a country where hepatitis B is common. Ask your health care provider which countries are considered high risk.  Your parents were born in a high-risk country, and you have not been immunized against hepatitis B (hepatitis B vaccine).  You have HIV or AIDS.  You use needles to inject street drugs.  You live with someone who has hepatitis B.  You have had sex with someone who has hepatitis B.  You get hemodialysis treatment.  You take certain medicines for conditions, including cancer, organ transplantation, and autoimmune conditions. Hepatitis C  Blood testing is recommended for:  Everyone born from [birth year] through 1965.  Anyone with known risk factors for hepatitis C. Sexually transmitted infections (STIs)  You should be screened for sexually transmitted infections (STIs) including gonorrhea and chlamydia if:  You are sexually active and are younger than 54 years of age.  You are older than 53 years of age and your health care provider tells you that you are at risk for this type of infection.  Your sexual activity has changed since you were last screened and you are at an increased risk for chlamydia or gonorrhea. Ask your health care provider if you are at risk.  If you do not have HIV, but are at risk, it may be recommended that you take a prescription medicine daily to prevent HIV infection. This is called pre-exposure prophylaxis (PrEP). You are considered at risk if:  You are sexually active and do not regularly use condoms or know the HIV status of your partner(s).  You take drugs by injection.  You are sexually  active with a partner who has HIV. Talk with your health care provider about whether you are at high risk of being infected with HIV. If you choose to begin PrEP, you should first be tested for HIV. You should then be tested every 3 months for as long as you are taking PrEP.  PREGNANCY   If you are premenopausal and you may become pregnant, ask your health care provider about preconception counseling.  If you may  become pregnant, take 400 to 800 micrograms (mcg) of folic acid every day.  If you want to prevent pregnancy, talk to your health care provider about birth control (contraception). OSTEOPOROSIS AND MENOPAUSE   Osteoporosis is a disease in which the bones lose minerals and strength with aging. This can result in serious bone fractures. Your risk for osteoporosis can be identified using a bone density scan.  If you are 61 years of age or older, or if you are at risk for osteoporosis and fractures, ask your health care provider if you should be screened.  Ask your health care provider whether you should take a calcium or vitamin D supplement to lower your risk for osteoporosis.  Menopause may have certain physical symptoms and risks.  Hormone replacement therapy may reduce some of these symptoms and risks. Talk to your health care provider about whether hormone replacement therapy is right for you.  HOME CARE INSTRUCTIONS   Schedule regular health, dental, and eye exams.  Stay current with your immunizations.   Do not use any tobacco products including cigarettes, chewing tobacco, or electronic cigarettes.  If you are pregnant, do not drink alcohol.  If you are breastfeeding, limit how much and how often you drink alcohol.  Limit alcohol intake to no more than 1 drink per day for nonpregnant women. One drink equals 12 ounces of beer, 5 ounces of wine, or 1 ounces of hard liquor.  Do not use street drugs.  Do not share needles.  Ask your health care provider for help if  you need support or information about quitting drugs.  Tell your health care provider if you often feel depressed.  Tell your health care provider if you have ever been abused or do not feel safe at home.   This information is not intended to replace advice given to you by your health care provider. Make sure you discuss any questions you have with your health care provider.   Document Released: 12/11/2010 Document Revised: 06/18/2014 Document Reviewed: 04/29/2013 Elsevier Interactive Patient Education [employer].

## 2015-04-02 LAB — LIPID PANEL
Chol/HDL Ratio: 9.1 ratio units — ABNORMAL HIGH (ref 0.0–4.4)
Cholesterol, Total: 292 mg/dL — ABNORMAL HIGH (ref 100–199)
HDL: 32 mg/dL — ABNORMAL LOW (ref >39)
Triglycerides: 699 mg/dL (ref 0–149)

## 2015-04-02 LAB — CMP14+EGFR
ALBUMIN: 4.2 g/dL (ref 3.5–5.5)
ALT: 63 IU/L — ABNORMAL HIGH (ref 0–32)
AST: 28 IU/L (ref 0–40)
Albumin/Globulin Ratio: 1.8 (ref 1.1–2.5)
Alkaline Phosphatase: 83 IU/L (ref 39–117)
BUN / CREAT RATIO: 18 (ref 9–23)
BUN: 14 mg/dL (ref 6–24)
Bilirubin Total: <0.2 mg/dL (ref 0.0–1.2)
CALCIUM: 9.3 mg/dL (ref 8.7–10.2)
CO2: 20 mmol/L (ref 18–29)
CREATININE: 0.79 mg/dL (ref 0.57–1.00)
Chloride: 101 mmol/L (ref 97–106)
GFR calc Af Amer: 98 mL/min/{1.73_m2} (ref >59)
GFR, EST NON AFRICAN AMERICAN: 85 mL/min/{1.73_m2} (ref >59)
GLOBULIN, TOTAL: 2.3 g/dL (ref 1.5–4.5)
Glucose: 225 mg/dL — ABNORMAL HIGH (ref 65–99)
Potassium: 3.9 mmol/L (ref 3.5–5.2)
SODIUM: 139 mmol/L (ref 136–144)
TOTAL PROTEIN: 6.5 g/dL (ref 6.0–8.5)

## 2015-04-04 ENCOUNTER — Other Ambulatory Visit: Payer: Self-pay | Admitting: Nurse Practitioner

## 2015-04-04 MED ORDER — FENOFIBRATE 145 MG PO TABS: 145.0000 mg | ORAL_TABLET | Freq: Every day | ORAL | Status: DC

## 2015-07-18 ENCOUNTER — Telehealth: Payer: Self-pay | Admitting: Nurse Practitioner

## 2015-07-25 ENCOUNTER — Telehealth: Payer: Self-pay | Admitting: Nurse Practitioner

## 2015-07-25 MED ORDER — FENOFIBRATE 160 MG PO TABS: 160.0000 mg | ORAL_TABLET | Freq: Every day | ORAL | Status: DC

## 2015-07-25 NOTE — Telephone Encounter (Signed)
Fenofibrate changed to 160mg  daily

## 2015-07-26 ENCOUNTER — Ambulatory Visit (INDEPENDENT_AMBULATORY_CARE_PROVIDER_SITE_OTHER): Payer: BLUE CROSS/BLUE SHIELD | Admitting: Nurse Practitioner

## 2015-07-26 ENCOUNTER — Encounter: Payer: Self-pay | Admitting: Nurse Practitioner

## 2015-07-26 VITALS — BP 123/75 | HR 68 | Temp 97.5°F | Ht 64.0 in | Wt 233.0 lb

## 2015-07-26 DIAGNOSIS — E039 Hypothyroidism, unspecified: Secondary | ICD-10-CM

## 2015-07-26 DIAGNOSIS — E785 Hyperlipidemia, unspecified: Secondary | ICD-10-CM | POA: Diagnosis not present

## 2015-07-26 DIAGNOSIS — I10 Essential (primary) hypertension: Secondary | ICD-10-CM | POA: Diagnosis not present

## 2015-07-26 DIAGNOSIS — F329 Major depressive disorder, single episode, unspecified: Secondary | ICD-10-CM | POA: Diagnosis not present

## 2015-07-26 DIAGNOSIS — E1142 Type 2 diabetes mellitus with diabetic polyneuropathy: Secondary | ICD-10-CM

## 2015-07-26 DIAGNOSIS — Z1212 Encounter for screening for malignant neoplasm of rectum: Secondary | ICD-10-CM | POA: Diagnosis not present

## 2015-07-26 DIAGNOSIS — F411 Generalized anxiety disorder: Secondary | ICD-10-CM | POA: Diagnosis not present

## 2015-07-26 DIAGNOSIS — F32A Depression, unspecified: Secondary | ICD-10-CM

## 2015-07-26 DIAGNOSIS — Z1159 Encounter for screening for other viral diseases: Secondary | ICD-10-CM | POA: Diagnosis not present

## 2015-07-26 DIAGNOSIS — G43711 Chronic migraine without aura, intractable, with status migrainosus: Secondary | ICD-10-CM

## 2015-07-26 LAB — POCT GLYCOSYLATED HEMOGLOBIN (HGB A1C): Hemoglobin A1C: 8.3

## 2015-07-26 MED ORDER — GLIPIZIDE 10 MG PO TABS: ORAL_TABLET | ORAL | Status: DC

## 2015-07-26 MED ORDER — FENOFIBRATE 160 MG PO TABS: 160.0000 mg | ORAL_TABLET | Freq: Every day | ORAL | Status: DC

## 2015-07-26 MED ORDER — SITAGLIPTIN PHOS-METFORMIN HCL 50-1000 MG PO TABS: 1.0000 | ORAL_TABLET | Freq: Two times a day (BID) | ORAL | Status: DC

## 2015-07-26 MED ORDER — BENAZEPRIL HCL 20 MG PO TABS: 20.0000 mg | ORAL_TABLET | Freq: Every day | ORAL | Status: DC

## 2015-07-26 MED ORDER — BUPROPION HCL ER (XL) 150 MG PO TB24: 450.0000 mg | ORAL_TABLET | Freq: Every day | ORAL | Status: DC

## 2015-07-26 MED ORDER — TOPIRAMATE 50 MG PO TABS: 50.0000 mg | ORAL_TABLET | Freq: Two times a day (BID) | ORAL | Status: DC

## 2015-07-26 MED ORDER — ATORVASTATIN CALCIUM 40 MG PO TABS: 40.0000 mg | ORAL_TABLET | Freq: Every day | ORAL | Status: DC

## 2015-07-26 MED ORDER — PROPRANOLOL HCL 40 MG PO TABS: 40.0000 mg | ORAL_TABLET | Freq: Two times a day (BID) | ORAL | Status: DC

## 2015-07-26 NOTE — Progress Notes (Signed)
Subjective:    Patient ID: [name], female    DOB: [birth year], 55 y.o.   MRN: [id number]   Patient here today for follow up of chronic medical problems.  Outpatient Encounter Prescriptions as of 07/26/2015  Medication Sig  . atorvastatin (LIPITOR) 40 MG tablet Take 1 tablet (40 mg total) by mouth daily.  . baclofen (LIORESAL) 10 MG tablet Take 1 tablet (10 mg total) by mouth 3 (three) times daily.  . benazepril (LOTENSIN) 20 MG tablet Take 1 tablet (20 mg total) by mouth daily.  Marland Kitchen buPROPion (WELLBUTRIN XL) 150 MG 24 hr tablet Take 3 tablets (450 mg total) by mouth daily.          . fenofibrate 160 MG tablet Take 1 tablet (160 mg total) by mouth daily.  Marland Kitchen glipiZIDE (GLUCOTROL) 10 MG tablet TAKE ONE TABLET BY MOUTH EVERY DAY BEFORE BREAKFAST  . ondansetron (ZOFRAN) 4 MG tablet Take 1 tablet (4 mg total) by mouth every 8 (eight) hours as needed for nausea or vomiting.  . propranolol (INDERAL) 40 MG tablet Take 1 tablet (40 mg total) by mouth 2 (two) times daily.  . sitaGLIPtin-metformin (JANUMET) 50-1000 MG tablet Take 1 tablet by mouth 2 (two) times daily with a meal.  . topiramate (TOPAMAX) 50 MG tablet Take 1 tablet (50 mg total) by mouth 2 (two) times daily.   No facility-administered encounter medications on file as of 07/26/2015.     Hyperlipidemia This is a chronic problem. The current episode started more than 1 year ago. The problem is uncontrolled. Recent lipid tests were reviewed and are variable. Exacerbating diseases include diabetes and hypothyroidism. Associated symptoms include shortness of breath (related to seasonal allergies). Pertinent negatives include no chest pain. Current antihyperlipidemic treatment includes statins. The current treatment provides moderate improvement of lipids. Compliance problems include adherence to diet and adherence to exercise.  Risk factors for coronary artery disease include diabetes mellitus, dyslipidemia, hypertension, obesity and  post-menopausal.  Hypertension This is a chronic problem. The current episode started more than 1 year ago. The problem is uncontrolled. Associated symptoms include shortness of breath (related to seasonal allergies). Pertinent negatives include no chest pain or palpitations. Risk factors for coronary artery disease include diabetes mellitus, dyslipidemia, obesity and post-menopausal state. Past treatments include ACE inhibitors. The current treatment provides moderate improvement. Compliance problems include diet and exercise.  Hypertensive end-organ damage includes a thyroid problem.  Diabetes She presents for her follow-up diabetic visit. She has type 2 diabetes mellitus. No MedicAlert identification noted. Her disease course has been stable. Pertinent negatives for hypoglycemia include no dizziness or seizures. Associated symptoms include fatigue. Pertinent negatives for diabetes include no chest pain and no visual change. Risk factors for coronary artery disease include diabetes mellitus, dyslipidemia, family history, hypertension and obesity. Current diabetic treatment includes oral agent (dual therapy). She is compliant with treatment all of the time. Her weight is stable. She is following a generally unhealthy diet. She has not had a previous visit with a dietitian. She rarely participates in exercise. (Not checking blood sugars) An ACE inhibitor/angiotensin II receptor blocker is being taken. She does not see a podiatrist.Eye exam is current.  Migraine  This is a chronic problem. The current episode started more than 1 year ago. The problem occurs seasonly. Pertinent negatives include no dizziness, numbness, seizures or visual change. The symptoms are aggravated by weather changes. Her past medical history is significant for hypertension.  Thyroid Problem Symptoms include fatigue. Patient reports no constipation,  diaphoresis, diarrhea, menstrual problem, palpitations or visual change. Her past  medical history is significant for diabetes and hyperlipidemia.  depression/anxiety Patient has been taking wellbutrin for several years- working well for her- she does well when she has a job and she is currently working which really helps.      Review of Systems  Constitutional: Positive for fatigue. Negative for diaphoresis, activity change, appetite change and unexpected weight change.  Respiratory: Positive for shortness of breath (related to seasonal allergies) and wheezing.   Cardiovascular: Negative for chest pain and palpitations.  Gastrointestinal: Negative for diarrhea and constipation.  Genitourinary: Negative for menstrual problem.  Musculoskeletal: Negative.   Neurological: Negative for dizziness, seizures and numbness.  Psychiatric/Behavioral: Negative.   All other systems reviewed and are negative.      Objective:   Physical Exam  Constitutional: She is oriented to person, place, and time. She appears well-developed and well-nourished. No distress.  HENT:  Right Ear: External ear normal.  Left Ear: External ear normal.  Nose: Nose normal.  Mouth/Throat: Oropharynx is clear and moist. No oropharyngeal exudate.  Eyes: Conjunctivae and EOM are normal. Pupils are equal, round, and reactive to light.  Neck: Trachea normal, normal range of motion and full passive range of motion without pain. Neck supple. No JVD present. Carotid bruit is not present. No thyromegaly present.  Cardiovascular: Normal rate, regular rhythm, normal heart sounds and intact distal pulses.  Exam reveals no gallop and no friction rub.   No murmur heard. Pulmonary/Chest: Effort normal. She has wheezes.  Abdominal: Soft. Bowel sounds are normal. She exhibits no distension and no mass. There is no tenderness.  Musculoskeletal: Normal range of motion.  Lymphadenopathy:    She has no cervical adenopathy.  Neurological: She is alert and oriented to person, place, and time. She has normal reflexes. No  cranial nerve deficit.  Skin: Skin is warm and dry. She is not diaphoretic.  Psychiatric: She has a normal mood and affect. Her behavior is normal. Judgment and thought content normal.   Diabetic Foot Exam - Simple   Simple Foot Form  Diabetic Foot exam was performed with the following findings:  Yes 07/26/2015  4:45 PM  Visual Inspection  No deformities, no ulcerations, no other skin breakdown bilaterally:  Yes  Sensation Testing  Intact to touch and monofilament testing bilaterally:  Yes  Pulse Check  Posterior Tibialis and Dorsalis pulse intact bilaterally:  Yes  Comments     BP 123/75 mmHg  Pulse 68  Temp(Src) 97.5 F (36.4 C) (Oral)  Ht _0  (1.626 m)  Wt 233 lb (105.688 kg)  BMI 39.97 kg/m2     Results for orders placed or performed in visit on 07/26/15  POCT glycosylated hemoglobin (Hb A1C)  Result Value Ref Range   Hemoglobin A1C 8.3       Assessment & Plan:   1. Essential hypertension *do not add salt to diet - CMP14+EGFR - benazepril (LOTENSIN) 20 MG tablet; Take 1 tablet (20 mg total) by mouth daily.  Dispense: 30 tablet; Refill: 5  2. Type 2 diabetes mellitus with diabetic polyneuropathy, without long-term current use of insulin (HCC) Stricter carb counting Patient wants to change diet and get it down without changing meds- will allow 3 months - POCT glycosylated hemoglobin (Hb A1C) - Microalbumin / creatinine urine ratio - sitaGLIPtin-metformin (JANUMET) 50-1000 MG tablet; Take 1 tablet by mouth 2 (two) times daily with a meal.  Dispense: 60 tablet; Refill: 5 - glipiZIDE (GLUCOTROL) 10  MG tablet; TAKE ONE TABLET BY MOUTH EVERY DAY BEFORE BREAKFAST  Dispense: 30 tablet; Refill: 5  3. Hyperlipidemia Low fta diet - Lipid panel - fenofibrate 160 MG tablet; Take 1 tablet (160 mg total) by mouth daily.  Dispense: 30 tablet; Refill: 5 - atorvastatin (LIPITOR) 40 MG tablet; Take 1 tablet (40 mg total) by mouth daily.  Dispense: 30 tablet; Refill: 5  4.  Hypothyroidism, unspecified hypothyroidism type  5. Anxiety state Stress management  6. Morbid obesity, unspecified obesity type ([place]) Discussed diet and exercise for person with BMI >25 Will recheck weight in 3-6 months  7. Depression Stress management - buPROPion (WELLBUTRIN XL) 150 MG 24 hr tablet; Take 3 tablets (450 mg total) by mouth daily.  Dispense: 90 tablet; Refill: 5  8. Intractable chronic migraine without aura and with status migrainosus Avoid caffeine - propranolol (INDERAL) 40 MG tablet; Take 1 tablet (40 mg total) by mouth 2 (two) times daily.  Dispense: 60 tablet; Refill: 5 - topiramate (TOPAMAX) 50 MG tablet; Take 1 tablet (50 mg total) by mouth 2 (two) times daily.  Dispense: 60 tablet; Refill: 5  9. Screening for malignant neoplasm of the rectum - Fecal occult blood, imunochemical; Future  10. Need for hepatitis C screening test - Hepatitis C antibody    Labs pending Health maintenance reviewed Diet and exercise encouraged Continue all meds Follow up  In 3 months   [place], FNP

## 2015-07-26 NOTE — Patient Instructions (Signed)
Diabetes and Foot Care Diabetes may cause you to have problems because of poor blood supply (circulation) to your feet and legs. This may cause the skin on your feet to become thinner, break easier, and heal more slowly. Your skin may become dry, and the skin may peel and crack. You may also have nerve damage in your legs and feet causing decreased feeling in them. You may not notice minor injuries to your feet that could lead to infections or more serious problems. Taking care of your feet is one of the most important things you can do for yourself.  HOME CARE INSTRUCTIONS  Wear shoes at all times, even in the house. Do not go barefoot. Bare feet are easily injured.  Check your feet daily for blisters, cuts, and redness. If you cannot see the bottom of your feet, use a mirror or ask someone for help.  Wash your feet with warm water (do not use hot water) and mild soap. Then pat your feet and the areas between your toes until they are completely dry. Do not soak your feet as this can dry your skin.  Apply a moisturizing lotion or petroleum jelly (that does not contain alcohol and is unscented) to the skin on your feet and to dry, brittle toenails. Do not apply lotion between your toes.  Trim your toenails straight across. Do not dig under them or around the cuticle. File the edges of your nails with an emery board or nail file.  Do not cut corns or calluses or try to remove them with medicine.  Wear clean socks or stockings every day. Make sure they are not too tight. Do not wear knee-high stockings since they may decrease blood flow to your legs.  Wear shoes that fit properly and have enough cushioning. To break in new shoes, wear them for just a few hours a day. This prevents you from injuring your feet. Always look in your shoes before you put them on to be sure there are no objects inside.  Do not cross your legs. This may decrease the blood flow to your feet.  If you find a minor scrape,  cut, or break in the skin on your feet, keep it and the skin around it clean and dry. These areas may be cleansed with mild soap and water. Do not cleanse the area with peroxide, alcohol, or iodine.  When you remove an adhesive bandage, be sure not to damage the skin around it.  If you have a wound, look at it several times a day to make sure it is healing.  Do not use heating pads or hot water bottles. They may burn your skin. If you have lost feeling in your feet or legs, you may not know it is happening until it is too late.  Make sure your health care provider performs a complete foot exam at least annually or more often if you have foot problems. Report any cuts, sores, or bruises to your health care provider immediately. SEEK MEDICAL CARE IF:   You have an injury that is not healing.  You have cuts or breaks in the skin.  You have an ingrown nail.  You notice redness on your legs or feet.  You feel burning or tingling in your legs or feet.  You have pain or cramps in your legs and feet.  Your legs or feet are numb.  Your feet always feel cold. SEEK IMMEDIATE MEDICAL CARE IF:   There is increasing redness,   swelling, or pain in or around a wound.  There is a red line that goes up your leg.  Pus is coming from a wound.  You develop a fever or as directed by your health care provider.  You notice a bad smell coming from an ulcer or wound.   This information is not intended to replace advice given to you by your health care provider. Make sure you discuss any questions you have with your health care provider.   Document Released: 05/25/2000 Document Revised: 01/28/2013 Document Reviewed: 11/04/2012 Elsevier Interactive Patient Education 2016 [employer]  

## 2015-07-27 LAB — LIPID PANEL
CHOL/HDL RATIO: 6.3 ratio — AB (ref 0.0–4.4)
CHOLESTEROL TOTAL: 292 mg/dL — AB (ref 100–199)
HDL: 46 mg/dL (ref >39)
LDL Calculated: 191 mg/dL — ABNORMAL HIGH (ref 0–99)
TRIGLYCERIDES: 273 mg/dL — AB (ref 0–149)
VLDL Cholesterol Cal: 55 mg/dL — ABNORMAL HIGH (ref 5–40)

## 2015-07-27 LAB — CMP14+EGFR
A/G RATIO: 2 (ref 1.1–2.5)
ALK PHOS: 64 IU/L (ref 39–117)
ALT: 73 IU/L — ABNORMAL HIGH (ref 0–32)
AST: 39 IU/L (ref 0–40)
Albumin: 4.6 g/dL (ref 3.5–5.5)
BUN/Creatinine Ratio: 20 (ref 9–23)
BUN: 16 mg/dL (ref 6–24)
Bilirubin Total: 0.2 mg/dL (ref 0.0–1.2)
CO2: 20 mmol/L (ref 18–29)
Calcium: 10.6 mg/dL — ABNORMAL HIGH (ref 8.7–10.2)
Chloride: 105 mmol/L (ref 96–106)
Creatinine, Ser: 0.8 mg/dL (ref 0.57–1.00)
GFR calc Af Amer: 97 mL/min/{1.73_m2} (ref >59)
GFR calc non Af Amer: 84 mL/min/{1.73_m2} (ref >59)
GLOBULIN, TOTAL: 2.3 g/dL (ref 1.5–4.5)
Glucose: 79 mg/dL (ref 65–99)
POTASSIUM: 4.1 mmol/L (ref 3.5–5.2)
SODIUM: 140 mmol/L (ref 134–144)
Total Protein: 6.9 g/dL (ref 6.0–8.5)

## 2015-07-27 LAB — MICROALBUMIN / CREATININE URINE RATIO
CREATININE, UR: 32.2 mg/dL
MICROALB/CREAT RATIO: <9.3 mg/g creat (ref 0.0–30.0)

## 2015-07-27 LAB — HEPATITIS C ANTIBODY: Hep C Virus Ab: <0.1 s/co ratio (ref 0.0–0.9)

## 2015-09-09 ENCOUNTER — Emergency Department
Admission: EM | Admit: 2015-09-09 | Discharge: 2015-09-09 | Disposition: A | Discharge status: Home / Self Care | Payer: BLUE CROSS/BLUE SHIELD | Source: Home / Self Care | Attending: Family Medicine | Admitting: Family Medicine

## 2015-09-09 ENCOUNTER — Encounter: Payer: Self-pay | Admitting: *Deleted

## 2015-09-09 DIAGNOSIS — B029 Zoster without complications: Secondary | ICD-10-CM | POA: Diagnosis not present

## 2015-09-09 MED ORDER — VALACYCLOVIR HCL 1 G PO TABS: 1000.0000 mg | ORAL_TABLET | Freq: Three times a day (TID) | ORAL | Status: DC

## 2015-09-09 MED ORDER — PREDNISONE 20 MG PO TABS: 20.0000 mg | ORAL_TABLET | Freq: Two times a day (BID) | ORAL | Status: DC

## 2015-09-09 NOTE — Discharge Instructions (Signed)

## 2015-09-09 NOTE — ED Notes (Signed)
Pt c/o pruritic rash to chest arm and foot since this AM. No new meds, soaps, clothes, sheets, etc.

## 2015-09-09 NOTE — ED Provider Notes (Signed)
CSN: [id number]     Arrival date & time 09/09/15  1026 History   First MD Initiated Contact with Patient 09/09/15 1050     Chief Complaint  Patient presents with  . Rash      HPI Comments: Patient reports that she has been fatigued and felt warm yesterday.  She recalls that her back had been sore two days ago.  This morning she awoke with a rash on her upper mid/left chest.  She feels well otherwise. She has a history of mild seasonal rhinitis and asthma.  Patient is a 55 y.o. female presenting with rash. The history is provided by the patient.  Rash Location: chest. Quality: painful and redness   Quality: not blistering, not bruising, not burning, not draining, not itchy, not peeling, not scaling, not swelling and not weeping   Pain details:    Quality:  Aching   Severity:  Mild   Onset quality:  Sudden   Duration:  4 hours   Timing:  Constant   Progression:  Unchanged Severity:  Mild Onset quality:  Sudden Duration:  4 hours Timing:  Constant Progression:  Unchanged Chronicity:  New Context: not animal contact, not chemical exposure, not exposure to similar rash, not food, not hot tub use, not insect bite/sting, not medications, not new detergent/soap, not plant contact, not sick contacts and not sun exposure   Relieved by:  None tried Worsened by:  Nothing tried Ineffective treatments:  None tried Associated symptoms: fatigue and myalgias   Associated symptoms: no abdominal pain, no diarrhea, no fever, no headaches, no induration, no joint pain, no nausea, no shortness of breath, no sore throat, no URI and not wheezing     Past Medical History  Diagnosis Date  . Chest pain   . Hyperlipidemia     Severe  . Hypertension   . Abdominal discomfort     Chronic abdominal and pelvic pain resulting in significant loss of time from work  . Asthma   . Diabetes mellitus   . Drug overdose 2009    60 Naprosyn, psychiatric admission  . Migraines   . Anxiety     with depression    . Hypothyroidism    Past Surgical History  Procedure Laterality Date  . Abdominal hysterectomy  02/2007    + lysis of adhesions  . Bilateral oophorectomy       in 2 surgeries prior to 9/08  . Ventral hernia repair      x3  . Appendectomy  1990s    ruptured, late 90s  . Colonoscopy  02/2010     normal upper endoscopy, single colonic polyp,   History reviewed. No pertinent family history. Social History  Substance Use Topics  . Smoking status: Never Smoker   . Smokeless tobacco: Never Used  . Alcohol Use: No   OB History    No data available     Review of Systems  Constitutional: Positive for fatigue. Negative for fever.  HENT: Negative for sore throat.   Respiratory: Negative for shortness of breath and wheezing.   Gastrointestinal: Negative for nausea, abdominal pain and diarrhea.  Musculoskeletal: Positive for myalgias. Negative for arthralgias.  Skin: Positive for rash.  Neurological: Negative for headaches.  All other systems reviewed and are negative.   Allergies  Aspirin; Bee venom; Benadryl; Morphine and related; and Vicks formula 44 cough-cold pm  Home Medications   Prior to Admission medications   Medication Sig Start Date End Date Taking? Authorizing Provider  atorvastatin (  LIPITOR) 40 MG tablet Take 1 tablet (40 mg total) by mouth daily. 07/26/15   Mary-Margaret Hassell Done, FNP  baclofen (LIORESAL) 10 MG tablet Take 1 tablet (10 mg total) by mouth 3 (three) times daily. 09/09/14   Mary-Margaret Hassell Done, FNP  benazepril (LOTENSIN) 20 MG tablet Take 1 tablet (20 mg total) by mouth daily. 07/26/15   Mary-Margaret Hassell Done, FNP  buPROPion (WELLBUTRIN XL) 150 MG 24 hr tablet Take 3 tablets (450 mg total) by mouth daily. 07/26/15   Mary-Margaret Hassell Done, FNP  fenofibrate 160 MG tablet Take 1 tablet (160 mg total) by mouth daily. 07/26/15   Mary-Margaret Hassell Done, FNP  glipiZIDE (GLUCOTROL) 10 MG tablet TAKE ONE TABLET BY MOUTH EVERY DAY BEFORE BREAKFAST 07/26/15   Mary-Margaret  Hassell Done, FNP  ondansetron (ZOFRAN) 4 MG tablet Take 1 tablet (4 mg total) by mouth every 8 (eight) hours as needed for nausea or vomiting. 11/12/14   Tiffany A Gann, PA-C  predniSONE (DELTASONE) 20 MG tablet Take 1 tablet (20 mg total) by mouth 2 (two) times daily. Take with food. 09/09/15   Kandra Nicolas, MD  propranolol (INDERAL) 40 MG tablet Take 1 tablet (40 mg total) by mouth 2 (two) times daily. 07/26/15   Mary-Margaret Hassell Done, FNP  sitaGLIPtin-metformin (JANUMET) 50-1000 MG tablet Take 1 tablet by mouth 2 (two) times daily with a meal. 07/26/15   Mary-Margaret Hassell Done, FNP  topiramate (TOPAMAX) 50 MG tablet Take 1 tablet (50 mg total) by mouth 2 (two) times daily. 07/26/15   Mary-Margaret Hassell Done, FNP  valACYclovir (VALTREX) 1000 MG tablet Take 1 tablet (1,000 mg total) by mouth 3 (three) times daily. 09/09/15   Kandra Nicolas, MD   Meds Ordered and Administered this Visit  Medications - No data to display  BP 126/74 mmHg  Pulse 62  Temp(Src) 98.1 F (36.7 C) (Oral)  Wt 236 lb (107.049 kg)  SpO2 99% No data found.   Physical Exam  Constitutional: She is oriented to person, place, and time. She appears well-developed and well-nourished. No distress.  HENT:  Head: Normocephalic.  Nose: Nose normal.  Mouth/Throat: Oropharynx is clear and moist.  Eyes: Conjunctivae are normal. Pupils are equal, round, and reactive to light.  Neck: Neck supple.  Cardiovascular: Normal heart sounds.   Pulmonary/Chest: Breath sounds normal.    Macular, slightly raised erythematous herpetiform eruption on chest as noted on diagram.    Abdominal: There is no tenderness.  Musculoskeletal: She exhibits no edema or tenderness.  Lymphadenopathy:    She has no cervical adenopathy.  Neurological: She is alert and oriented to person, place, and time.  Skin: Skin is warm and dry.  Nursing note and vitals reviewed.   ED Course  Procedures none   MDM   1. Herpes zoster    Begin Valtrex, and prednisone  burst. Followup with Family Doctor if not improved in about 5 days.    Kandra Nicolas, MD 09/13/15 2126

## 2015-09-13 ENCOUNTER — Ambulatory Visit (INDEPENDENT_AMBULATORY_CARE_PROVIDER_SITE_OTHER): Payer: BLUE CROSS/BLUE SHIELD | Admitting: Nurse Practitioner

## 2015-09-13 ENCOUNTER — Encounter: Payer: Self-pay | Admitting: Nurse Practitioner

## 2015-09-13 VITALS — BP 146/76 | HR 66 | Temp 97.5°F | Ht 64.0 in | Wt 232.0 lb

## 2015-09-13 DIAGNOSIS — B029 Zoster without complications: Secondary | ICD-10-CM | POA: Diagnosis not present

## 2015-09-13 DIAGNOSIS — Z09 Encounter for follow-up examination after completed treatment for conditions other than malignant neoplasm: Secondary | ICD-10-CM | POA: Diagnosis not present

## 2015-09-13 NOTE — Progress Notes (Signed)
   Subjective:    Patient ID: [name], female    DOB: [birth year], 55 y.o.   MRN: [id number]  HPI Patient was seen at [facility] ER with rash on chest- she was diagnosed with shingles- painful and itchy- was started on valtrex and prednisone. Has spread to right posteriro shoulder blade.    Review of Systems  Constitutional: Negative.   HENT: Negative.   Respiratory: Negative.   Cardiovascular: Negative.   Gastrointestinal: Negative.   Genitourinary: Negative.   Neurological: Negative.   Psychiatric/Behavioral: Negative.   All other systems reviewed and are negative.      Objective:   Physical Exam  Constitutional: She is oriented to person, place, and time. She appears well-developed and well-nourished.  Cardiovascular: Normal rate, regular rhythm and normal heart sounds.   Pulmonary/Chest: Effort normal and breath sounds normal.  Neurological: She is alert and oriented to person, place, and time.  Skin: Skin is warm.  vesicular patches on left anterior chest  Psychiatric: She has a normal mood and affect. Her behavior is normal. Judgment and thought content normal.    BP 146/76 mmHg  Pulse 66  Temp(Src) 97.5 F (36.4 C) (Oral)  Ht 5\' 4"  (1.626 m)  Wt 232 lb (105.235 kg)  BMI 39.80 kg/m2       Assessment & Plan:   1. Hospital discharge follow-up   2. Shingles    Do not pick or scratch area May RTO where area is dried up Continue meds as rx by Urgent Care  Mary-Margaret Hassell Done, [place]

## 2015-09-13 NOTE — Patient Instructions (Signed)

## 2015-10-26 ENCOUNTER — Encounter: Payer: Self-pay | Admitting: *Deleted

## 2015-10-26 ENCOUNTER — Emergency Department
Admission: EM | Admit: 2015-10-26 | Discharge: 2015-10-26 | Disposition: A | Discharge status: Home / Self Care | Payer: BLUE CROSS/BLUE SHIELD | Source: Home / Self Care | Attending: Family Medicine | Admitting: Family Medicine

## 2015-10-26 DIAGNOSIS — B029 Zoster without complications: Secondary | ICD-10-CM | POA: Diagnosis not present

## 2015-10-26 MED ORDER — PREDNISONE 20 MG PO TABS: 20.0000 mg | ORAL_TABLET | Freq: Two times a day (BID) | ORAL | Status: DC

## 2015-10-26 MED ORDER — VALACYCLOVIR HCL 1 G PO TABS: 1000.0000 mg | ORAL_TABLET | Freq: Three times a day (TID) | ORAL | Status: DC

## 2015-10-26 NOTE — ED Provider Notes (Signed)
CSN: [id number]     Arrival date & time 10/26/15  0804 History   None    Chief Complaint  Patient presents with  . Rash      HPI Comments: Patient complains of onset of nausea, fatigue, and chills two days ago.  Yesterday she noticed itching and vague discomfort in her right upper back, and today noticed a rash on her right upper back.  She reports that she has not had a Zostavax vaccination.  Patient is a 55 y.o. female presenting with rash. The history is provided by the patient.  Rash Location: right upper back. Quality: burning, dryness, itchiness, painful and redness   Quality: not blistering, not bruising, not draining, not peeling, not scaling, not swelling and not weeping   Pain details:    Quality:  Aching and itching   Severity:  Mild   Onset quality:  Sudden   Duration:  1 day   Timing:  Constant   Progression:  Worsening Severity:  Mild Onset quality:  Sudden Duration:  1 day Timing:  Constant Progression:  Worsening Chronicity:  Recurrent Context: not animal contact, not chemical exposure, not exposure to similar rash, not food, not hot tub use, not insect bite/sting, not medications, not new detergent/soap, not plant contact and not sick contacts   Relieved by:  Nothing Worsened by:  Contact Ineffective treatments:  None tried Associated symptoms: fatigue and nausea   Associated symptoms: no abdominal pain, no fever, no headaches, no induration, no joint pain, no myalgias, no sore throat and not vomiting     Past Medical History  Diagnosis Date  . Chest pain   . Hyperlipidemia     Severe  . Hypertension   . Abdominal discomfort     Chronic abdominal and pelvic pain resulting in significant loss of time from work  . Asthma   . Diabetes mellitus   . Drug overdose 2009    60 Naprosyn, psychiatric admission  . Migraines   . Anxiety     with depression  . Hypothyroidism    Past Surgical History  Procedure Laterality Date  . Abdominal hysterectomy  02/2007     + lysis of adhesions  . Bilateral oophorectomy       in 2 surgeries prior to 9/08  . Ventral hernia repair      x3  . Appendectomy  1990s    ruptured, late 90s  . Colonoscopy  02/2010     normal upper endoscopy, single colonic polyp,   History reviewed. No pertinent family history. Social History  Substance Use Topics  . Smoking status: Never Smoker   . Smokeless tobacco: Never Used  . Alcohol Use: No   OB History    No data available     Review of Systems  Constitutional: Positive for fatigue. Negative for fever.  HENT: Negative for sore throat.   Gastrointestinal: Positive for nausea. Negative for vomiting and abdominal pain.  Musculoskeletal: Negative for myalgias and arthralgias.  Skin: Positive for rash.  Neurological: Negative for headaches.  All other systems reviewed and are negative.   Allergies  Aspirin; Bee venom; Benadryl; Morphine and related; and Vicks formula 44 cough-cold pm  Home Medications   Prior to Admission medications   Medication Sig Start Date End Date Taking? Authorizing Provider  atorvastatin (LIPITOR) 40 MG tablet Take 1 tablet (40 mg total) by mouth daily. 07/26/15   Mary-Margaret Hassell Done, FNP  baclofen (LIORESAL) 10 MG tablet Take 1 tablet (10 mg total) by mouth  3 (three) times daily. 09/09/14   Mary-Margaret Hassell Done, FNP  benazepril (LOTENSIN) 20 MG tablet Take 1 tablet (20 mg total) by mouth daily. 07/26/15   Mary-Margaret Hassell Done, FNP  buPROPion (WELLBUTRIN XL) 150 MG 24 hr tablet Take 3 tablets (450 mg total) by mouth daily. 07/26/15   Mary-Margaret Hassell Done, FNP  fenofibrate 160 MG tablet Take 1 tablet (160 mg total) by mouth daily. 07/26/15   Mary-Margaret Hassell Done, FNP  glipiZIDE (GLUCOTROL) 10 MG tablet TAKE ONE TABLET BY MOUTH EVERY DAY BEFORE BREAKFAST 07/26/15   Mary-Margaret Hassell Done, FNP  predniSONE (DELTASONE) 20 MG tablet Take 1 tablet (20 mg total) by mouth 2 (two) times daily. Take with food. 10/26/15   Kandra Nicolas, MD  propranolol  (INDERAL) 40 MG tablet Take 1 tablet (40 mg total) by mouth 2 (two) times daily. 07/26/15   Mary-Margaret Hassell Done, FNP  sitaGLIPtin-metformin (JANUMET) 50-1000 MG tablet Take 1 tablet by mouth 2 (two) times daily with a meal. 07/26/15   Mary-Margaret Hassell Done, FNP  topiramate (TOPAMAX) 50 MG tablet Take 1 tablet (50 mg total) by mouth 2 (two) times daily. 07/26/15   Mary-Margaret Hassell Done, FNP  valACYclovir (VALTREX) 1000 MG tablet Take 1 tablet (1,000 mg total) by mouth 3 (three) times daily. 10/26/15   Kandra Nicolas, MD   Meds Ordered and Administered this Visit  Medications - No data to display  BP 156/82 mmHg  Pulse 74  Temp(Src) 98.5 F (36.9 C) (Oral)  Resp 18  Ht 5\' 4"  (1.626 m)  Wt 236 lb (107.049 kg)  BMI 40.49 kg/m2  SpO2 98% No data found.   Physical Exam  Constitutional: She is oriented to person, place, and time. She appears well-developed and well-nourished. No distress.  HENT:  Head: Normocephalic.  Nose: Nose normal.  Mouth/Throat: Oropharynx is clear and moist.  Eyes: Conjunctivae are normal. Pupils are equal, round, and reactive to light.  Neck: Neck supple.  Cardiovascular: Normal heart sounds.   Pulmonary/Chest: Breath sounds normal.  Abdominal: There is no tenderness.  Musculoskeletal: She exhibits no edema.  Lymphadenopathy:    She has no cervical adenopathy.  Neurological: She is alert and oriented to person, place, and time.  Skin: Skin is warm and dry. Rash noted. Rash is maculopapular.     RIght upper back reveals several herpetiform lesions in distribution as noted on diagram.    Nursing note and vitals reviewed.   ED Course  Procedures none   MDM   1. Herpes zoster    Begin Valtrex, and prednisone burst. Followup with Family Doctor when present flare-up resolved for Zostavax.    Kandra Nicolas, MD 10/26/15 [phone]

## 2015-10-26 NOTE — ED Notes (Signed)
Pt c/o painful, itching rash on her RT upper back x 1 day. She reports a hx of shingles.

## 2015-10-26 NOTE — Discharge Instructions (Signed)

## 2015-10-27 ENCOUNTER — Telehealth: Payer: Self-pay | Admitting: Nurse Practitioner

## 2015-10-27 NOTE — Telephone Encounter (Signed)
Patient given an appointment for tomorrow at 10:30am.

## 2015-10-28 ENCOUNTER — Ambulatory Visit: Payer: BLUE CROSS/BLUE SHIELD

## 2015-10-28 ENCOUNTER — Ambulatory Visit (INDEPENDENT_AMBULATORY_CARE_PROVIDER_SITE_OTHER): Payer: BLUE CROSS/BLUE SHIELD | Admitting: Nurse Practitioner

## 2015-10-28 ENCOUNTER — Encounter: Payer: Self-pay | Admitting: Nurse Practitioner

## 2015-10-28 VITALS — BP 144/80 | HR 75 | Temp 97.0°F | Ht 64.0 in | Wt 233.0 lb

## 2015-10-28 DIAGNOSIS — B029 Zoster without complications: Secondary | ICD-10-CM

## 2015-10-28 NOTE — Patient Instructions (Signed)
Shingles Shingles is an infection that causes a painful skin rash and fluid-filled blisters. Shingles is caused by the same virus that causes chickenpox. Shingles only develops in people who:  Have had chickenpox.  Have gotten the chickenpox vaccine. (This is rare.) The first symptoms of shingles may be itching, tingling, or pain in an area on your skin. A rash will follow in a few days or weeks. The rash is usually on one side of the body in a bandlike or beltlike pattern. Over time, the rash turns into fluid-filled blisters that break open, scab over, and dry up. Medicines may:  Help you manage pain.  Help you recover more quickly.  Help to prevent long-term problems. HOME CARE Medicines  Take medicines only as told by your doctor.  Apply an anti-itch or numbing cream to the affected area as told by your doctor. Blister and Rash Care  Take a cool bath or put cool compresses on the area of the rash or blisters as told by your doctor. This may help with pain and itching.  Keep your rash covered with a loose bandage (dressing). Wear loose-fitting clothing.  Keep your rash and blisters clean with mild soap and cool water or as told by your doctor.  Check your rash every day for signs of infection. These include redness, swelling, and pain that lasts or gets worse.  Do not pick your blisters.  Do not scratch your rash. General Instructions  Rest as told by your doctor.  Keep all follow-up visits as told by your doctor. This is important.  Until your blisters scab over, your infection can cause chickenpox in people who have never had it or been vaccinated against it. To prevent this from happening, avoid touching other people or being around other people, especially:  Babies.  Pregnant women.  Children who have eczema.  Elderly people who have transplants.  People who have chronic illnesses, such as leukemia or AIDS. GET HELP IF:  Your pain does not get better with  medicine.  Your pain does not get better after the rash heals.  Your rash looks infected. Signs of infection include:  Redness.  Swelling.  Pain that lasts or gets worse. GET HELP RIGHT AWAY IF:  The rash is on your face or nose.  You have pain in your face, pain around your eye area, or loss of feeling on one side of your face.  You have ear pain or you have ringing in your ear.  You have loss of taste.  Your condition gets worse.   This information is not intended to replace advice given to you by your health care provider. Make sure you discuss any questions you have with your health care provider.   Document Released: 11/14/2007 Document Revised: 06/18/2014 Document Reviewed: 03/09/2014 Elsevier Interactive Patient Education 2016 [employer]vier Inc[employer]  

## 2015-10-28 NOTE — Progress Notes (Signed)
   Subjective:    Patient ID: [name], female    DOB: 03-03-1961, 55 y.o.   MRN: [id number]  HPI Patient in today for hospital follow up- she went to ER 09/09/15 and was diagnosed with shingles. Was started on valtrex. Was getting better but had to go back to ER on 10/26/15 with flare up. SHe was given valtrex again as well as prednisone burst. Says that area is very painful and itchy.   Review of Systems  Constitutional: Negative.   HENT: Negative.   Respiratory: Negative.   Cardiovascular: Negative.   Genitourinary: Negative.   Neurological: Negative.   Psychiatric/Behavioral: Negative.   All other systems reviewed and are negative.      Objective:   Physical Exam  Constitutional: She is oriented to person, place, and time. She appears well-developed and well-nourished.  Cardiovascular: Normal rate, regular rhythm and normal heart sounds.   Pulmonary/Chest: Effort normal and breath sounds normal.  Neurological: She is alert and oriented to person, place, and time. No cranial nerve deficit.  Skin: Skin is warm. Rash noted.  Erythematous maculopapular lesions in linear pattern on right mid back.  Psychiatric: She has a normal mood and affect. Her behavior is normal. Judgment and thought content normal.   BP 144/80 mmHg  Pulse 75  Temp(Src) 97 F (36.1 C) (Oral)  Ht 5\' 4"  (1.626 m)  Wt 233 lb (105.688 kg)  BMI 39.97 kg/m2       Assessment & Plan:  Shingles outbreak  Keep covered Continue valtrex and prednisone as rx Avoid scratching and rubbing RTO prn  Mary-Margaret Hassell Done, FNP

## 2015-11-01 ENCOUNTER — Telehealth: Payer: Self-pay | Admitting: Nurse Practitioner

## 2015-11-01 NOTE — Telephone Encounter (Signed)
Zarephath for n ote till next Monday but may return before then if lesions have dried up

## 2015-11-01 NOTE — Telephone Encounter (Signed)
Patient aware and note placed up front for patient.

## 2015-11-01 NOTE — Telephone Encounter (Signed)
Spoke with [name] and she states she was supposed to return to work today but over the weekend she had some more pop up and she can't go back to work. She needs a note for work and also doesn't know if she needs to come back in to see you. She also has questions regarding how long she is going to be out of work as her work is starting to ask questions since she was out of work all last week. Please advise.

## 2015-11-28 ENCOUNTER — Ambulatory Visit (HOSPITAL_BASED_OUTPATIENT_CLINIC_OR_DEPARTMENT_OTHER)
Admission: RE | Admit: 2015-11-28 | Discharge: 2015-11-28 | Disposition: A | Discharge status: Home / Self Care | Payer: BLUE CROSS/BLUE SHIELD | Source: Ambulatory Visit | Attending: Family Medicine | Admitting: Family Medicine

## 2015-11-28 ENCOUNTER — Emergency Department
Admission: EM | Admit: 2015-11-28 | Discharge: 2015-11-29 | Disposition: A | Discharge status: Home / Self Care | Payer: BLUE CROSS/BLUE SHIELD | Source: Home / Self Care | Attending: Family Medicine | Admitting: Family Medicine

## 2015-11-28 ENCOUNTER — Encounter: Payer: Self-pay | Admitting: *Deleted

## 2015-11-28 DIAGNOSIS — M79662 Pain in left lower leg: Secondary | ICD-10-CM | POA: Diagnosis present

## 2015-11-28 DIAGNOSIS — L959 Vasculitis limited to the skin, unspecified: Secondary | ICD-10-CM

## 2015-11-28 DIAGNOSIS — M7989 Other specified soft tissue disorders: Secondary | ICD-10-CM | POA: Insufficient documentation

## 2015-11-28 NOTE — ED Notes (Signed)
Patient scheduled for venus doppler @ [facility] today @ 730pm. Pt given apt time and location while in the office. Dr. Assunta Found will call with results this evening or tomorrow.

## 2015-11-28 NOTE — ED Provider Notes (Addendum)
CSN: [id number]     Arrival date & time 11/28/15  1425 History   First MD Initiated Contact with Patient 11/28/15 1453     Chief Complaint  Patient presents with  . Rash      HPI Comments: Patient awoke two days ago with rash, itching, and pain in her left lower posterior calf.  She has not felt well with nausea, fatigue, chills, and myalgias.  No known insect or tick bites.  No contact with allergens.  Patient is a 55 y.o. female presenting with rash. The history is provided by the patient.  Rash Location:  Leg Leg rash location:  L lower leg Quality: dryness, itchiness, painful and redness   Quality: not blistering, not bruising, not burning, not draining, not peeling, not scaling, not swelling and not weeping   Pain details:    Quality:  Itching and burning   Severity:  Mild   Onset quality:  Sudden   Timing:  Constant   Progression:  Worsening Severity:  Mild Onset quality:  Sudden Duration:  2 days Timing:  Constant Progression:  Worsening Chronicity:  New Context: not animal contact, not chemical exposure, not exposure to similar rash, not food, not hot tub use, not insect bite/sting, not medications, not new detergent/soap, not plant contact and not sick contacts   Relieved by:  Nothing Worsened by:  Nothing tried Ineffective treatments:  None tried Associated symptoms: fatigue and nausea   Associated symptoms: no abdominal pain, no diarrhea, no fever, no headaches, no induration, no joint pain, no myalgias, no periorbital edema, no shortness of breath, no sore throat, no URI, not vomiting and not wheezing     Past Medical History  Diagnosis Date  . Chest pain   . Hyperlipidemia     Severe  . Hypertension   . Abdominal discomfort     Chronic abdominal and pelvic pain resulting in significant loss of time from work  . Asthma   . Diabetes mellitus   . Drug overdose 2009    60 Naprosyn, psychiatric admission  . Migraines   . Anxiety     with depression  .  Hypothyroidism    Past Surgical History  Procedure Laterality Date  . Abdominal hysterectomy  02/2007    + lysis of adhesions  . Bilateral oophorectomy       in 2 surgeries prior to 9/08  . Ventral hernia repair      x3  . Appendectomy  1990s    ruptured, late 90s  . Colonoscopy  02/2010     normal upper endoscopy, single colonic polyp,   History reviewed. No pertinent family history. Social History  Substance Use Topics  . Smoking status: Never Smoker   . Smokeless tobacco: Never Used  . Alcohol Use: No   OB History    No data available     Review of Systems  Constitutional: Positive for fatigue. Negative for fever.  HENT: Negative for sore throat.   Respiratory: Negative for shortness of breath and wheezing.   Gastrointestinal: Positive for nausea. Negative for vomiting, abdominal pain and diarrhea.  Musculoskeletal: Negative for myalgias and arthralgias.  Skin: Positive for rash.  Neurological: Negative for headaches.  All other systems reviewed and are negative.   Allergies  Aspirin; Bee venom; Benadryl; Morphine and related; and Vicks formula 44 cough-cold pm  Home Medications   Prior to Admission medications   Medication Sig Start Date End Date Taking? Authorizing Provider  atorvastatin (LIPITOR) 40 MG tablet Take  1 tablet (40 mg total) by mouth daily. 07/26/15   Mary-Margaret Hassell Done, FNP  baclofen (LIORESAL) 10 MG tablet Take 1 tablet (10 mg total) by mouth 3 (three) times daily. 09/09/14   Mary-Margaret Hassell Done, FNP  benazepril (LOTENSIN) 20 MG tablet Take 1 tablet (20 mg total) by mouth daily. 07/26/15   Mary-Margaret Hassell Done, FNP  buPROPion (WELLBUTRIN XL) 150 MG 24 hr tablet Take 3 tablets (450 mg total) by mouth daily. 07/26/15   Mary-Margaret Hassell Done, FNP  fenofibrate 160 MG tablet Take 1 tablet (160 mg total) by mouth daily. 07/26/15   Mary-Margaret Hassell Done, FNP  glipiZIDE (GLUCOTROL) 10 MG tablet TAKE ONE TABLET BY MOUTH EVERY DAY BEFORE BREAKFAST 07/26/15    Mary-Margaret Hassell Done, FNP  predniSONE (DELTASONE) 20 MG tablet Take one tab by mouth once daily for five days. Take with food. 11/29/15   Kandra Nicolas, MD  propranolol (INDERAL) 40 MG tablet Take 1 tablet (40 mg total) by mouth 2 (two) times daily. 07/26/15   Mary-Margaret Hassell Done, FNP  sitaGLIPtin-metformin (JANUMET) 50-1000 MG tablet Take 1 tablet by mouth 2 (two) times daily with a meal. 07/26/15   Mary-Margaret Hassell Done, FNP  topiramate (TOPAMAX) 50 MG tablet Take 1 tablet (50 mg total) by mouth 2 (two) times daily. 07/26/15   Mary-Margaret Hassell Done, FNP   Meds Ordered and Administered this Visit  Medications - No data to display  BP 129/72 mmHg  Pulse 66  Temp(Src) 98.2 F (36.8 C) (Oral)  SpO2 97% No data found.   Physical Exam  Constitutional: She appears well-developed and well-nourished. No distress.  HENT:  Head: Normocephalic.  Nose: Nose normal.  Mouth/Throat: Oropharynx is clear and moist.  Eyes: Conjunctivae are normal. Pupils are equal, round, and reactive to light.  Neck: Neck supple.  Cardiovascular: Normal heart sounds.   Pulmonary/Chest: Breath sounds normal.  Abdominal: Soft. Bowel sounds are normal. There is no tenderness.  Musculoskeletal: She exhibits tenderness. She exhibits no edema.       Left lower leg: She exhibits tenderness. She exhibits no bony tenderness, no swelling, no edema, no deformity and no laceration.       Legs: Left lower calf has macular erythematous area about 8cm by 14cm that does not blanch with pressure.  The area is tender to palpation and slightly warm.  The surrounding calf is tender also  Lymphadenopathy:    She has no cervical adenopathy.  Neurological: She is alert.  Skin: Skin is warm and dry.  Nursing note and vitals reviewed.    ED Course  Procedures none    Labs Reviewed  POCT CBC W AUTO DIFF ([facility]):  WBC 10.7; LY 37.5; MO 5.0; GR 57.5; Hgb 12.9; Platelets 251     Imaging Review US Venous Img Lower  Unilateral Left  11/28/2015  CLINICAL DATA:  Acute onset of left lower extremity pain and erythema. Initial encounter. EXAM: LEFT LOWER EXTREMITY VENOUS DOPPLER ULTRASOUND TECHNIQUE: Gray-scale sonography with graded compression, as well as color Doppler and duplex ultrasound were performed to evaluate the lower extremity deep venous systems from the level of the common femoral vein and including the common femoral, femoral, profunda femoral, popliteal and calf veins including the posterior tibial, peroneal and gastrocnemius veins when visible. The superficial great saphenous vein was also interrogated. Spectral Doppler was utilized to evaluate flow at rest and with distal augmentation maneuvers in the common femoral, femoral and popliteal veins. COMPARISON:  None. FINDINGS: Contralateral Common Femoral Vein: Respiratory phasicity is normal and symmetric  with the symptomatic side. No evidence of thrombus. Normal compressibility. Common Femoral Vein: No evidence of thrombus. Normal compressibility, respiratory phasicity and response to augmentation. Saphenofemoral Junction: No evidence of thrombus. Normal compressibility and flow on color Doppler imaging. Profunda Femoral Vein: No evidence of thrombus. Normal compressibility and flow on color Doppler imaging. Femoral Vein: No evidence of thrombus. Normal compressibility, respiratory phasicity and response to augmentation. Popliteal Vein: No evidence of thrombus. Normal compressibility, respiratory phasicity and response to augmentation. Calf Veins: No evidence of thrombus. Normal compressibility and flow on color Doppler imaging. The peroneal vein is not visualized. Superficial Great Saphenous Vein: No evidence of thrombus. Normal compressibility and flow on color Doppler imaging. Venous Reflux:  None. Other Findings: Minimal soft tissue edema is noted at the distal left calf. IMPRESSION: 1. No evidence of deep venous thrombosis. 2. Minimal soft tissue edema at the  distal left calf. Electronically Signed   By: Garald Balding M.D.   On: 11/28/2015 20:40      MDM   1. Cutaneous vasculitis ?etiology   Begin prednisone burst 20mg  daily for 5 days. Arrange dermatology consultation for further evaluation.    Kandra Nicolas, MD 11/29/15 [phone]  Addendum: Discussed ultrasound results with patient.  Kandra Nicolas, MD 11/29/15 [phone]

## 2015-11-28 NOTE — ED Notes (Signed)
Pt c/o pruritic rash to posterior left lower leg x 2 days. C/o some associated nausea and aches. No otc meds or creams used.

## 2015-11-29 ENCOUNTER — Telehealth: Payer: Self-pay | Admitting: *Deleted

## 2015-11-29 MED ORDER — PREDNISONE 20 MG PO TABS
ORAL_TABLET | ORAL | Status: DC
Start: 2015-11-29 — End: 2015-12-06

## 2015-11-29 NOTE — Discharge Instructions (Signed)
Vasculitis  Vasculitis is swelling (inflammation) of the blood vessels. With vasculitis, the blood vessels can become thick, narrow, scarred, or weak, and enough blood may not be able to flow through them. This can cause damage to the muscles, kidneys, lungs, brain, and other parts of the body.   There are many types of vasculitis. Some last only a short time while others last a long time.  CAUSES   The exact cause is unknown, but vasculitis can develop when the body's immune system attacks its own blood vessels. This attack can be caused by:  · An infection.  · An immune system disease, such as lupus, rheumatoid arthritis, or scleroderma.  · An allergic reaction to a medicine.  · Cancer that affects blood cells, such as leukemia and lymphoma.  RISK FACTORS  · Being a smoker.  · Being under stress.  · Having a physical injury.  SIGNS AND SYMPTOMS   Symptoms vary depending on the type of vasculitis you have. Symptoms that are common to all types of vasculitis include:  · Fever.  · Poor appetite.  · Weight loss.  · Feeling very tired.  · Having aches and pains.  · Weakness.  · Numbness in an area of your body.  Symptoms for specific types of vasculitis include:  · Skin problems, such as sores, spots, or rashes.  · Trouble seeing.  · Trouble breathing.  · Blood in your urine.  · Headaches.  · Stomach pain.  · Stuffy or bloody nose.  DIAGNOSIS   Your health care provider will ask about your symptoms and do a physical exam. You may have tests done, such as:  · A complete blood count (CBC).  · Erythrocyte sedimentation, also called sed rate test.  · C-reactive protein (CRP).  · Antineutrophil cytoplasmic antibodies (ANCA).  · A urine test.  · A biopsy of a blood vessel.  · A nerve conduction study.  · Imaging tests, such as:    X-rays.    A CT scan.    An ultrasound.    An MRI.    Angiography.  TREATMENT   Treatment will depend on the type of vasculitis you have and how severe it is. Sometimes treatment is not needed.  Treatment often includes:  · Medicines.  · Physical therapy or occupational therapy. This helps strengthen muscles that were weakened by the disease.  You will need to see your health care provider while you are being treated. During follow-up visits, your health care provider will:  · Perform blood tests and bone density tests.  · Check your blood pressure and blood sugar.  · Check for side effects of any medicines you are taking.  Vasculitis cannot always be cured. Sometimes symptoms go away but the disease does not (the disease goes in remission). If symptoms return, increased treatment may be needed.  HOME CARE INSTRUCTIONS   · Take medicines only as directed by your health care provider.  · Keep all follow-up visits as directed by your health care provider. This is important.  · Exercise. Talk with your health care provider about what exercises are okay for you to do. Usually exercises that increase your heart rate (aerobic exercise), such as walking, are recommended. Aerobic exercise helps control your blood pressure and prevent bone loss.  · Follow a healthy diet. Include healthy sources of protein, fruits, vegetables, and whole grains in your diet.  · Learn as much as you can about vasculitis, and consider joining a support group.   Understanding your condition and talking with others who have it may help you cope. Talk with your health care provider if you feel stressed, anxious, or depressed.  SEEK MEDICAL CARE IF:   · Your symptoms return, or you have new symptoms.  · Your fever, fatigue, headache, or weight loss gets worse.  · You have signs of infection, such as fever, warmth, tenderness, redness, or swelling.  SEEK IMMEDIATE MEDICAL CARE IF:   · Your vision gets worse.  · Your pain does not go away, even after you take pain medicine.  · You have chest or stomach pain.  · You have trouble breathing.  · One side of your face or body suddenly becomes weak or numb.  · Your nose bleeds.  · There is blood in  your urine.  MAKE SURE YOU:  · Understand these instructions.  · Will watch your condition.  · Will get help right away if you are not doing well or get worse.     This information is not intended to replace advice given to you by your health care provider. Make sure you discuss any questions you have with your health care provider.     Document Released: 03/24/2009 Document Revised: 06/18/2014 Document Reviewed: 07/22/2013  Elsevier Interactive Patient Education ©2016 [employer]

## 2015-11-29 NOTE — ED Notes (Unsigned)
LM for pt with referral. appt sch'ed with Lars Mage, PA at [facility] in the [place] office for 12/09/15 @ 9:15am, arrive at 9:00am. [facility] will mail NP packet to pt with appt info. Notes faxed to [place]. Charna Archer, LPN

## 2015-12-06 ENCOUNTER — Encounter: Payer: Self-pay | Admitting: Family

## 2015-12-06 ENCOUNTER — Emergency Department
Admission: EM | Admit: 2015-12-06 | Discharge: 2015-12-06 | Disposition: A | Discharge status: Home / Self Care | Payer: BLUE CROSS/BLUE SHIELD | Source: Home / Self Care | Attending: Family Medicine | Admitting: Family Medicine

## 2015-12-06 ENCOUNTER — Ambulatory Visit (INDEPENDENT_AMBULATORY_CARE_PROVIDER_SITE_OTHER): Payer: BLUE CROSS/BLUE SHIELD | Admitting: Family

## 2015-12-06 ENCOUNTER — Encounter: Payer: Self-pay | Admitting: *Deleted

## 2015-12-06 VITALS — BP 118/70 | HR 64 | Temp 97.2°F | Ht 64.0 in | Wt 235.6 lb

## 2015-12-06 DIAGNOSIS — L509 Urticaria, unspecified: Secondary | ICD-10-CM | POA: Diagnosis not present

## 2015-12-06 DIAGNOSIS — L298 Other pruritus: Secondary | ICD-10-CM | POA: Diagnosis not present

## 2015-12-06 MED ORDER — TRIAMCINOLONE ACETONIDE 0.1 % EX CREA: 1.0000 "application " | TOPICAL_CREAM | Freq: Two times a day (BID) | CUTANEOUS | Status: DC

## 2015-12-06 NOTE — ED Provider Notes (Signed)
CSN: [id number]     Arrival date & time 12/06/15  1035 History   First MD Initiated Contact with Patient 12/06/15 1057     Chief Complaint  Patient presents with  . Rash   (Consider location/radiation/quality/duration/timing/severity/associated sxs/prior Treatment) HPI  [name] is a 55 y.o. female presenting to UC with c/o erythematous, itching, mildly painful rash on her Left cheek and  Left upper arm for 2-3 days.  Pt reports recurrent shingles and was last seen at [facility] for a painful rash on her Left lower leg. Pt was seen by Dr. Assunta Found who recommended pt f/u with dermatology.  Her f/u appointment is at the end of the week but she became concerned about the rash on her face.  The rash on her leg has nearly resolved since last visit on 11/28/15 but there are still 3 ting red spots left. Denies fever, chills, n/v/d but but does report some fatigue and body aches.  She has not tried anything for her current rash.  She is allergic to Benadryl, states it causes her throat to close up.  Denies new soaps, lotions, or medications. No contact with others with similar rash.      Past Medical History  Diagnosis Date  . Chest pain   . Hyperlipidemia     Severe  . Hypertension   . Abdominal discomfort     Chronic abdominal and pelvic pain resulting in significant loss of time from work  . Asthma   . Diabetes mellitus   . Drug overdose 2009    60 Naprosyn, psychiatric admission  . Migraines   . Anxiety     with depression  . Hypothyroidism    Past Surgical History  Procedure Laterality Date  . Abdominal hysterectomy  02/2007    + lysis of adhesions  . Bilateral oophorectomy       in 2 surgeries prior to 9/08  . Ventral hernia repair      x3  . Appendectomy  1990s    ruptured, late 90s  . Colonoscopy  02/2010     normal upper endoscopy, single colonic polyp,   History reviewed. No pertinent family history. Social History  Substance Use Topics  . Smoking status: Never Smoker    . Smokeless tobacco: Never Used  . Alcohol Use: No   OB History    No data available     Review of Systems  Constitutional: Positive for fatigue. Negative for fever and chills.  Skin: Positive for rash. Negative for wound.    Allergies  Aspirin; Bee venom; Benadryl; Morphine and related; and Vicks formula 44 cough-cold pm  Home Medications   Prior to Admission medications   Medication Sig Start Date End Date Taking? Authorizing Provider  atorvastatin (LIPITOR) 40 MG tablet Take 1 tablet (40 mg total) by mouth daily. 07/26/15   Mary-Margaret Hassell Done, FNP  baclofen (LIORESAL) 10 MG tablet Take 1 tablet (10 mg total) by mouth 3 (three) times daily. 09/09/14   Mary-Margaret Hassell Done, FNP  benazepril (LOTENSIN) 20 MG tablet Take 1 tablet (20 mg total) by mouth daily. 07/26/15   Mary-Margaret Hassell Done, FNP  buPROPion (WELLBUTRIN XL) 150 MG 24 hr tablet Take 3 tablets (450 mg total) by mouth daily. 07/26/15   Mary-Margaret Hassell Done, FNP  fenofibrate 160 MG tablet Take 1 tablet (160 mg total) by mouth daily. 07/26/15   Mary-Margaret Hassell Done, FNP  glipiZIDE (GLUCOTROL) 10 MG tablet TAKE ONE TABLET BY MOUTH EVERY DAY BEFORE BREAKFAST 07/26/15   Mary-Margaret Hassell Done,  FNP  propranolol (INDERAL) 40 MG tablet Take 1 tablet (40 mg total) by mouth 2 (two) times daily. 07/26/15   Mary-Margaret Hassell Done, FNP  sitaGLIPtin-metformin (JANUMET) 50-1000 MG tablet Take 1 tablet by mouth 2 (two) times daily with a meal. 07/26/15   Mary-Margaret Hassell Done, FNP  topiramate (TOPAMAX) 50 MG tablet Take 1 tablet (50 mg total) by mouth 2 (two) times daily. 07/26/15   Mary-Margaret Hassell Done, FNP  triamcinolone cream (KENALOG) 0.1 % Apply 1 application topically 2 (two) times daily. 12/06/15   Noland Fordyce, PA-C   Meds Ordered and Administered this Visit  Medications - No data to display  BP 117/76 mmHg  Pulse 69  Temp(Src) 98.3 F (36.8 C) (Oral)  Resp 16  Ht 5\' 4"  (1.626 m)  Wt 230 lb (104.327 kg)  BMI 39.46 kg/m2  SpO2 98% No  data found.   Physical Exam  Constitutional: She is oriented to person, place, and time. She appears well-developed and well-nourished.  HENT:  Head: Normocephalic and atraumatic.  No oral swelling  Eyes: EOM are normal.  Neck: Normal range of motion.  Cardiovascular: Normal rate.   Pulmonary/Chest: Effort normal. No respiratory distress. She has no wheezes.  Musculoskeletal: Normal range of motion.  Neurological: She is alert and oriented to person, place, and time.  Skin: Skin is warm and dry. Rash noted. There is erythema.  Left cheek: 2cm circular erythematous slightly raised wheal.  Left upper arm: 2-3cm circular erythematous slightly raised wheal  Lesions are non-tender. No induration or fluctuance. No bleeding or discharge.  Left lower leg: pinpoint scabbed over erythematous lesions. Non-tender.  Psychiatric: She has a normal mood and affect. Her behavior is normal.  Nursing note and vitals reviewed.   ED Course  Procedures (including critical care time)  Labs Review Labs Reviewed - No data to display  Imaging Review No results found.    MDM   1. Pruritic erythematous rash   2. Urticaria    Rash c/w urticaria. Not c/w shingles rash.  Non-tender. No vesicular type lesions or linear pattern noted on current rashes.  Encouraged to keep f/u appointment with dermatology as well as suggested f/u with an allergist as she is allergic to Benadryl.  May be able to determine what is causing the hives and also what she may take to help prevent itching rashes from re-appearing.  Rx: Triamcinolone cream     Noland Fordyce, PA-C 12/06/15 1130

## 2015-12-06 NOTE — Patient Instructions (Signed)
Contact Dermatitis Dermatitis is redness, soreness, and swelling (inflammation) of the skin. Contact dermatitis is a reaction to certain substances that touch the skin. There are two types of contact dermatitis:   Irritant contact dermatitis. This type is caused by something that irritates your skin, such as dry hands from washing them too much. This type does not require previous exposure to the substance for a reaction to occur. This type is more common.  Allergic contact dermatitis. This type is caused by a substance that you are allergic to, such as a nickel allergy or poison ivy. This type only occurs if you have been exposed to the substance (allergen) before. Upon a repeat exposure, your body reacts to the substance. This type is less common. CAUSES  Many different substances can cause contact dermatitis. Irritant contact dermatitis is most commonly caused by exposure to:   Makeup.   Soaps.   Detergents.   Bleaches.   Acids.   Metal salts, such as nickel.  Allergic contact dermatitis is most commonly caused by exposure to:   Poisonous plants.   Chemicals.   Jewelry.   Latex.   Medicines.   Preservatives in products, such as clothing.  RISK FACTORS This condition is more likely to develop in:   People who have jobs that expose them to irritants or allergens.  People who have certain medical conditions, such as asthma or eczema.  SYMPTOMS  Symptoms of this condition may occur anywhere on your body where the irritant has touched you or is touched by you. Symptoms include:  Dryness or flaking.   Redness.   Cracks.   Itching.   Pain or a burning feeling.   Blisters.  Drainage of small amounts of blood or clear fluid from skin cracks. With allergic contact dermatitis, there may also be swelling in areas such as the eyelids, mouth, or genitals.  DIAGNOSIS  This condition is diagnosed with a medical history and physical exam. A patch skin test  may be performed to help determine the cause. If the condition is related to your job, you may need to see an occupational medicine specialist. TREATMENT Treatment for this condition includes figuring out what caused the reaction and protecting your skin from further contact. Treatment may also include:   Steroid creams or ointments. Oral steroid medicines may be needed in more severe cases.  Antibiotics or antibacterial ointments, if a skin infection is present.  Antihistamine lotion or an antihistamine taken by mouth to ease itching.  A bandage (dressing). HOME CARE INSTRUCTIONS Skin Care  Moisturize your skin as needed.   Apply cool compresses to the affected areas.  Try taking a bath with:  Epsom salts. Follow the instructions on the packaging. You can get these at your local pharmacy or grocery store.  Baking soda. Pour a small amount into the bath as directed by your health care provider.  Colloidal oatmeal. Follow the instructions on the packaging. You can get this at your local pharmacy or grocery store.  Try applying baking soda paste to your skin. Stir water into baking soda until it reaches a paste-like consistency.  Do not scratch your skin.  Bathe less frequently, such as every other day.  Bathe in lukewarm water. Avoid using hot water. Medicines  Take or apply over-the-counter and prescription medicines only as told by your health care provider.   If you were prescribed an antibiotic medicine, take or apply your antibiotic as told by your health care provider. Do not stop using the   antibiotic even if your condition starts to improve. General Instructions  Keep all follow-up visits as told by your health care provider. This is important.  Avoid the substance that caused your reaction. If you do not know what caused it, keep a journal to try to track what caused it. Write down:  What you eat.  What cosmetic products you use.  What you drink.  What  you wear in the affected area. This includes jewelry.  If you were given a dressing, take care of it as told by your health care provider. This includes when to change and remove it. SEEK MEDICAL CARE IF:   Your condition does not improve with treatment.  Your condition gets worse.  You have signs of infection such as swelling, tenderness, redness, soreness, or warmth in the affected area.  You have a fever.  You have new symptoms. SEEK IMMEDIATE MEDICAL CARE IF:   You have a severe headache, neck pain, or neck stiffness.  You vomit.  You feel very sleepy.  You notice red streaks coming from the affected area.  Your bone or joint underneath the affected area becomes painful after the skin has healed.  The affected area turns darker.  You have difficulty breathing.   This information is not intended to replace advice given to you by your health care provider. Make sure you discuss any questions you have with your health care provider.   Document Released: 05/25/2000 Document Revised: 02/16/2015 Document Reviewed: 10/13/2014 Elsevier Interactive Patient Education 2016 [employer]  

## 2015-12-06 NOTE — Progress Notes (Signed)
   Subjective:    Patient ID: [name], female    DOB: [birth year], 55 y.o.   MRN: [id number]  Pt presents to the office today with recurrent rash on her face, arm, and back. Pt was just seen in the ED today for this rash. PT states she believes this is shingles, but was told at the ED this was not shingles. PT was told to follow up with dermatology. Pt was given kenalog cream.  Rash This is a recurrent problem. The current episode started in the past 7 days. The problem is unchanged. The affected locations include the face, left arm and left lower leg. The rash is characterized by itchiness and redness. She was exposed to nothing. Pertinent negatives include no congestion, cough, diarrhea, eye pain, fever, joint pain, rhinorrhea or shortness of breath. Past treatments include anti-itch cream. The treatment provided no relief.      Review of Systems  Constitutional: Negative for fever.  HENT: Negative for congestion and rhinorrhea.   Eyes: Negative for pain.  Respiratory: Negative for cough and shortness of breath.   Gastrointestinal: Negative for diarrhea.  Musculoskeletal: Negative for joint pain.  Skin: Positive for rash.  All other systems reviewed and are negative.      Objective:   Physical Exam  Constitutional: She is oriented to person, place, and time. She appears well-developed and well-nourished. No distress.  HENT:  Head: Normocephalic and atraumatic.  Eyes: Pupils are equal, round, and reactive to light.  Neck: Normal range of motion. Neck supple. No thyromegaly present.  Cardiovascular: Normal rate, regular rhythm, normal heart sounds and intact distal pulses.   No murmur heard. Pulmonary/Chest: Effort normal and breath sounds normal. No respiratory distress. She has no wheezes.  Abdominal: Soft. Bowel sounds are normal. She exhibits no distension. There is no tenderness.  Musculoskeletal: Normal range of motion. She exhibits no edema or tenderness.    Neurological: She is alert and oriented to person, place, and time.  Skin: Skin is warm and dry. Rash noted. There is erythema.  Left cheek: 2cm circular erythematous slightly raised wheal.  Left upper arm: 2-3cm circular erythematous slightly raised wheal  Psychiatric: She has a normal mood and affect. Her behavior is normal. Judgment and thought content normal.  Vitals reviewed.     BP 118/70 mmHg  Pulse 64  Temp(Src) 97.2 F (36.2 C) (Oral)  Ht 5\' 4"  (1.626 m)  Wt 235 lb 9.6 oz (106.867 kg)  BMI 40.42 kg/m2     Assessment & Plan:  1. Pruritic erythematous rash -Pt told to pick rx of kenalog cream and start using -Do not scratch -Avoid irritants RTO prn   Evelina Dun, FNP

## 2015-12-06 NOTE — ED Notes (Signed)
Pt c/o itching and painful rash on her LT face, arm, and leg x 2-3 days.

## 2015-12-12 ENCOUNTER — Telehealth: Payer: Self-pay | Admitting: Nurse Practitioner

## 2016-01-05 ENCOUNTER — Telehealth: Payer: Self-pay | Admitting: Nurse Practitioner

## 2016-01-18 ENCOUNTER — Ambulatory Visit (INDEPENDENT_AMBULATORY_CARE_PROVIDER_SITE_OTHER): Payer: BLUE CROSS/BLUE SHIELD | Admitting: Family Medicine

## 2016-01-18 ENCOUNTER — Encounter: Payer: Self-pay | Admitting: Family Medicine

## 2016-01-18 VITALS — BP 130/79 | HR 77 | Temp 97.3°F | Ht 64.0 in | Wt 225.0 lb

## 2016-01-18 DIAGNOSIS — L739 Follicular disorder, unspecified: Secondary | ICD-10-CM | POA: Diagnosis not present

## 2016-01-18 MED ORDER — SULFAMETHOXAZOLE-TRIMETHOPRIM 800-160 MG PO TABS: 1.0000 | ORAL_TABLET | Freq: Two times a day (BID) | ORAL | 0 refills | Status: DC

## 2016-01-18 NOTE — Progress Notes (Signed)
BP 130/79 (BP Location: Left Arm, Patient Position: Sitting, Cuff Size: Large)   Pulse 77   Temp 97.3 F (36.3 C) (Oral)   Ht 5\' 4"  (1.626 m)   Wt 225 lb (102.1 kg)   BMI 38.62 kg/m    Subjective:    Patient ID: [name], female    DOB: 10-18-1960, 55 y.o.   MRN: [id number]  HPI: [name] is a 55 y.o. female presenting on 01/18/2016 for Rash (on right leg, groin area and left upper arm; patient reports several recent episodes of rash that was diagnosed as shingles)   HPI Rash Patient has a rash on her leg or groin and left shoulder. The rash is been going on for the past few days. She has had shingles twice recently but this time is different. She does not note that she goes or not. She denies any fevers or chills. There is redness and warmth on the lesions both on her leg and her shoulder and in her groin. They are also very pruritic and slightly tender to touch. She denies any drainage from any sites.  Relevant past medical, surgical, family and social history reviewed and updated as indicated. Interim medical history since our last visit reviewed. Allergies and medications reviewed and updated.  Review of Systems  Constitutional: Negative for chills and fever.  HENT: Negative for congestion, ear discharge and ear pain.   Eyes: Negative for redness and visual disturbance.  Respiratory: Negative for chest tightness and shortness of breath.   Cardiovascular: Negative for chest pain and leg swelling.  Genitourinary: Negative for difficulty urinating and dysuria.  Musculoskeletal: Negative for back pain and gait problem.  Skin: Positive for color change and rash.  Neurological: Negative for light-headedness and headaches.  Psychiatric/Behavioral: Negative for agitation and behavioral problems.  All other systems reviewed and are negative.   Per HPI unless specifically indicated above     Medication List       Accurate as of 01/18/16  8:39 AM. Always  use your most recent med list.          atorvastatin 40 MG tablet Commonly known as:  LIPITOR Take 1 tablet (40 mg total) by mouth daily.   baclofen 10 MG tablet Commonly known as:  LIORESAL Take 1 tablet (10 mg total) by mouth 3 (three) times daily.   benazepril 20 MG tablet Commonly known as:  LOTENSIN Take 1 tablet (20 mg total) by mouth daily.   buPROPion 150 MG 24 hr tablet Commonly known as:  WELLBUTRIN XL Take 3 tablets (450 mg total) by mouth daily.   fenofibrate 160 MG tablet Take 1 tablet (160 mg total) by mouth daily.   glipiZIDE 10 MG tablet Commonly known as:  GLUCOTROL TAKE ONE TABLET BY MOUTH EVERY DAY BEFORE BREAKFAST   propranolol 40 MG tablet Commonly known as:  INDERAL Take 1 tablet (40 mg total) by mouth 2 (two) times daily.   sitaGLIPtin-metformin 50-1000 MG tablet Commonly known as:  JANUMET Take 1 tablet by mouth 2 (two) times daily with a meal.   sulfamethoxazole-trimethoprim 800-160 MG tablet Commonly known as:  BACTRIM DS Take 1 tablet by mouth 2 (two) times daily.   topiramate 50 MG tablet Commonly known as:  TOPAMAX Take 1 tablet (50 mg total) by mouth 2 (two) times daily.   triamcinolone cream 0.1 % Commonly known as:  KENALOG Apply 1 application topically 2 (two) times daily.  Objective:    BP 130/79 (BP Location: Left Arm, Patient Position: Sitting, Cuff Size: Large)   Pulse 77   Temp 97.3 F (36.3 C) (Oral)   Ht 5\' 4"  (1.626 m)   Wt 225 lb (102.1 kg)   BMI 38.62 kg/m   Wt Readings from Last 3 Encounters:  01/18/16 225 lb (102.1 kg)  12/06/15 235 lb 9.6 oz (106.9 kg)  12/06/15 230 lb (104.3 kg)    Physical Exam  Constitutional: She is oriented to person, place, and time. She appears well-developed and well-nourished. No distress.  Eyes: Conjunctivae and EOM are normal. Pupils are equal, round, and reactive to light.  Cardiovascular: Normal rate, regular rhythm, normal heart sounds and intact distal pulses.     No murmur heard. Pulmonary/Chest: Effort normal and breath sounds normal. No respiratory distress. She has no wheezes.  Musculoskeletal: Normal range of motion. She exhibits no edema or tenderness.  Neurological: She is alert and oriented to person, place, and time. Coordination normal.  Skin: Skin is warm and dry. Rash noted. Rash is papular (Large papules varying from about 0.5 cm to 1 cm in diameter with raised area in the center and small margin of surrounding erythema. No drainage or induration or fluctuation from the sites. Tissue has a beefy sick or texture). She is not diaphoretic.  Psychiatric: She has a normal mood and affect. Her behavior is normal.  Nursing note and vitals reviewed.     Assessment & Plan:   Problem List Items Addressed This Visit    None    Visit Diagnoses    Folliculitis    -  Primary   4-5 raised lesions on anterior leg to in her right groin and a couple on her left shoulder, will treat with antibiotic but there is a chance it may be fungal   Relevant Medications   sulfamethoxazole-trimethoprim (BACTRIM DS) 800-160 MG tablet       Follow up plan: Return if symptoms worsen or fail to improve.  Counseling provided for all of the vaccine components No orders of the defined types were placed in this encounter.   Caryl Pina, MD [facility] 01/18/2016, 8:39 AM

## 2016-02-20 ENCOUNTER — Encounter: Payer: Self-pay | Admitting: *Deleted

## 2016-02-20 ENCOUNTER — Emergency Department
Admission: EM | Admit: 2016-02-20 | Discharge: 2016-02-20 | Disposition: A | Discharge status: Home / Self Care | Payer: BLUE CROSS/BLUE SHIELD | Source: Home / Self Care | Attending: Family Medicine | Admitting: Family Medicine

## 2016-02-20 DIAGNOSIS — B029 Zoster without complications: Secondary | ICD-10-CM

## 2016-02-20 MED ORDER — PREDNISONE 20 MG PO TABS: 20.0000 mg | ORAL_TABLET | Freq: Two times a day (BID) | ORAL | 0 refills | Status: DC

## 2016-02-20 MED ORDER — VALACYCLOVIR HCL 1 G PO TABS: 1000.0000 mg | ORAL_TABLET | Freq: Three times a day (TID) | ORAL | 0 refills | Status: DC

## 2016-02-20 NOTE — ED Triage Notes (Signed)
Pt c/o rash on her RT upper back x 2 days.

## 2016-02-20 NOTE — ED Provider Notes (Signed)
[name] CARE    CSN: [id number] Arrival date & time: 02/20/16  1758  First Provider Contact:  First MD Initiated Contact with Patient 02/20/16 [place]        History   Chief Complaint Chief Complaint  Patient presents with  . Rash    HPI [name] is a 55 y.o. female.   Patient complains of two day history of painful pruritic rash on her left upper back and shoulder.  She has been fatigued and had sweats.  She notes that she had a shingles vaccination in July.   The history is provided by the patient.  Rash  Location: left upper back. Quality: dryness, itchiness, painful and redness   Quality: not blistering, not burning, not draining, not peeling, not scaling, not swelling and not weeping   Pain details:    Quality:  Itching and aching   Severity:  Mild   Onset quality:  Sudden   Duration:  2 days   Timing:  Constant   Progression:  Worsening Severity:  Mild Onset quality:  Sudden Duration:  2 days Timing:  Constant Progression:  Worsening Chronicity:  Recurrent Context: not animal contact, not chemical exposure, not exposure to similar rash, not food, not hot tub use, not insect bite/sting, not medications, not new detergent/soap, not plant contact and not sick contacts   Relieved by:  None tried Worsened by:  Nothing Ineffective treatments:  None tried Associated symptoms: fatigue, myalgias and nausea   Associated symptoms: no diarrhea, no fever, no headaches, no induration, no joint pain, no shortness of breath, no sore throat and no URI     Past Medical History:  Diagnosis Date  . Abdominal discomfort    Chronic abdominal and pelvic pain resulting in significant loss of time from work  . Anxiety    with depression  . Asthma   . Chest pain   . Diabetes mellitus   . Drug overdose 2009   60 Naprosyn, psychiatric admission  . Hyperlipidemia    Severe  . Hypertension   . Hypothyroidism   . Migraines     Patient Active Problem List    Diagnosis Date Noted  . Morbid obesity ([place]) 03/31/2015  . Hyperlipidemia   . Hypothyroidism   . Hypertension   . POLYP, COLON 04/13/2010  . Diabetes ([place]) 04/13/2010  . Anxiety state 04/13/2010  . Depression 04/13/2010  . Asthma 04/13/2010  . Migraines 04/13/2010    Past Surgical History:  Procedure Laterality Date  . ABDOMINAL HYSTERECTOMY  02/2007   + lysis of adhesions  . APPENDECTOMY  1990s   ruptured, late 90s  . BILATERAL OOPHORECTOMY      in 2 surgeries prior to 9/08  . COLONOSCOPY  02/2010    normal upper endoscopy, single colonic polyp,  . VENTRAL HERNIA REPAIR     x3    OB History    No data available       Home Medications    Prior to Admission medications   Medication Sig Start Date End Date Taking? Authorizing Provider  atorvastatin (LIPITOR) 40 MG tablet Take 1 tablet (40 mg total) by mouth daily. 07/26/15   Mary-Margaret Hassell Done, FNP  baclofen (LIORESAL) 10 MG tablet Take 1 tablet (10 mg total) by mouth 3 (three) times daily. 09/09/14   Mary-Margaret Hassell Done, FNP  benazepril (LOTENSIN) 20 MG tablet Take 1 tablet (20 mg total) by mouth daily. 07/26/15   Mary-Margaret Hassell Done, FNP  buPROPion (WELLBUTRIN XL) 150 MG 24 hr  tablet Take 3 tablets (450 mg total) by mouth daily. 07/26/15   Mary-Margaret Hassell Done, FNP  fenofibrate 160 MG tablet Take 1 tablet (160 mg total) by mouth daily. 07/26/15   Mary-Margaret Hassell Done, FNP  glipiZIDE (GLUCOTROL) 10 MG tablet TAKE ONE TABLET BY MOUTH EVERY DAY BEFORE BREAKFAST 07/26/15   Mary-Margaret Hassell Done, FNP  predniSONE (DELTASONE) 20 MG tablet Take 1 tablet (20 mg total) by mouth 2 (two) times daily. Take with food. 02/20/16   Kandra Nicolas, MD  propranolol (INDERAL) 40 MG tablet Take 1 tablet (40 mg total) by mouth 2 (two) times daily. 07/26/15   Mary-Margaret Hassell Done, FNP  sitaGLIPtin-metformin (JANUMET) 50-1000 MG tablet Take 1 tablet by mouth 2 (two) times daily with a meal. 07/26/15   Mary-Margaret Hassell Done, FNP    sulfamethoxazole-trimethoprim (BACTRIM DS) 800-160 MG tablet Take 1 tablet by mouth 2 (two) times daily. 01/18/16   Fransisca Kaufmann Dettinger, MD  topiramate (TOPAMAX) 50 MG tablet Take 1 tablet (50 mg total) by mouth 2 (two) times daily. 07/26/15   Mary-Margaret Hassell Done, FNP  triamcinolone cream (KENALOG) 0.1 % Apply 1 application topically 2 (two) times daily. 12/06/15   Noland Fordyce, PA-C  valACYclovir (VALTREX) 1000 MG tablet Take 1 tablet (1,000 mg total) by mouth 3 (three) times daily. 02/20/16   Kandra Nicolas, MD    Family History History reviewed. No pertinent family history.  Social History Social History  Substance Use Topics  . Smoking status: Never Smoker  . Smokeless tobacco: Never Used  . Alcohol use No     Allergies   Aspirin; Bee venom; Benadryl [diphenhydramine hcl]; Morphine and related; and Vicks formula 44 cough-cold pm [dm-apap-cpm]   Review of Systems Review of Systems  Constitutional: Positive for fatigue. Negative for fever.  HENT: Negative for sore throat.   Respiratory: Negative for shortness of breath.   Gastrointestinal: Positive for nausea. Negative for diarrhea.  Musculoskeletal: Positive for myalgias. Negative for arthralgias.  Skin: Positive for rash.  Neurological: Negative for headaches.  All other systems reviewed and are negative.    Physical Exam Triage Vital Signs ED Triage Vitals  Enc Vitals Group     BP 02/20/16 1845 111/76     Pulse Rate 02/20/16 1845 71     Resp 02/20/16 1845 16     Temp 02/20/16 1845 98.1 F (36.7 C)     Temp Source 02/20/16 1845 Oral     SpO2 02/20/16 1845 99 %     Weight 02/20/16 1846 239 lb (108.4 kg)     Height 02/20/16 1846 5\' 4"  (1.626 m)     Head Circumference --      Peak Flow --      Pain Score 02/20/16 1847 0     Pain Loc --      Pain Edu? --      Excl. in [place]? --    No data found.   Updated Vital Signs BP 111/76 (BP Location: Left Arm)   Pulse 71   Temp 98.1 F (36.7 C) (Oral)   Resp 16   Ht  5\' 4"  (1.626 m)   Wt 239 lb (108.4 kg)   SpO2 99%   BMI 41.02 kg/m   Visual Acuity Right Eye Distance:   Left Eye Distance:   Bilateral Distance:    Right Eye Near:   Left Eye Near:    Bilateral Near:     Physical Exam  Constitutional: She is oriented to person, place, and time. She appears well-developed  and well-nourished. No distress.  HENT:  Head: Normocephalic.  Right Ear: External ear normal.  Left Ear: External ear normal.  Nose: Nose normal.  Mouth/Throat: Oropharynx is clear and moist.  Eyes: Conjunctivae are normal. Pupils are equal, round, and reactive to light.  Neck: Neck supple.  Cardiovascular: Normal heart sounds.   Pulmonary/Chest: Breath sounds normal.  Abdominal: There is no tenderness.  Musculoskeletal: She exhibits no edema.  Lymphadenopathy:    She has no cervical adenopathy.  Neurological: She is alert and oriented to person, place, and time.  Skin: Skin is warm and dry. Rash noted.     On patient's left upper back in a dermatomal distribution is a herpetiform appearing erythematous eruption without vesicles.  No tenderness, swelling, induration, or warmth.  Nursing note and vitals reviewed.    UC Treatments / Results  Labs (all labs ordered are listed, but only abnormal results are displayed) Labs Reviewed - No data to display  EKG  EKG Interpretation None       Radiology No results found.  Procedures Procedures (including critical care time)  Medications Ordered in UC Medications - No data to display   Initial Impression / Assessment and Plan / UC Course  I have reviewed the triage vital signs and the nursing notes.  Pertinent labs & imaging results that were available during my care of the patient were reviewed by me and considered in my medical decision making (see chart for details).  Clinical Course  Begin Valtrex and prednisone burst. Followup with Family Doctor if not improved in one week.      Final Clinical  Impressions(s) / UC Diagnoses   Final diagnoses:  Herpes zoster    New Prescriptions New Prescriptions   PREDNISONE (DELTASONE) 20 MG TABLET    Take 1 tablet (20 mg total) by mouth 2 (two) times daily. Take with food.   VALACYCLOVIR (VALTREX) 1000 MG TABLET    Take 1 tablet (1,000 mg total) by mouth 3 (three) times daily.     Kandra Nicolas, MD 02/23/16 [phone]

## 2016-02-23 ENCOUNTER — Encounter: Payer: Self-pay | Admitting: Nurse Practitioner

## 2016-02-23 ENCOUNTER — Ambulatory Visit (INDEPENDENT_AMBULATORY_CARE_PROVIDER_SITE_OTHER): Payer: BLUE CROSS/BLUE SHIELD | Admitting: Nurse Practitioner

## 2016-02-23 VITALS — BP 140/71 | HR 73 | Temp 97.8°F | Ht 64.0 in | Wt 235.0 lb

## 2016-02-23 DIAGNOSIS — B029 Zoster without complications: Secondary | ICD-10-CM

## 2016-02-23 NOTE — Patient Instructions (Signed)

## 2016-02-23 NOTE — Progress Notes (Signed)
   Subjective:    Patient ID: [name], female    DOB: 08-28-1960, 55 y.o.   MRN: [id number]  HPI Patient in again with shingles- she has had flare ups since March of this year. SHe went to urgent care in [place] on 02/20/16 and was once again dx with shingles. Left shoulder. Needs work note and FMLA papers filled out.    Review of Systems  Constitutional: Negative.   HENT: Negative.   Respiratory: Negative.   Cardiovascular: Negative.   Genitourinary: Negative.   Neurological: Negative.   Psychiatric/Behavioral: Negative.   All other systems reviewed and are negative.      Objective:   Physical Exam  Constitutional: She is oriented to person, place, and time. She appears well-developed and well-nourished.  Cardiovascular: Normal rate and normal heart sounds.   Pulmonary/Chest: Effort normal and breath sounds normal.  Neurological: She is alert and oriented to person, place, and time.  Skin: Skin is warm. Rash (erythematous maculopapular rash on left shoulder. no vesicular lesions noted today.) noted.   BP 140/71   Pulse 73   Temp 97.8 F (36.6 C) (Oral)   Ht 5\' 4"  (1.626 m)   Wt 235 lb (106.6 kg)   BMI 40.34 kg/m         Assessment & Plan:   1. Shingles rash    Avoid scratching or rubbing area Continue valtrex as rx Rest rto prn  Mary-Margaret Hassell Done, FNP

## 2016-02-27 ENCOUNTER — Telehealth: Payer: Self-pay | Admitting: Nurse Practitioner

## 2016-02-27 NOTE — Telephone Encounter (Signed)
Letter is ready for pick-up.

## 2016-03-01 ENCOUNTER — Encounter: Payer: Self-pay | Admitting: Nurse Practitioner

## 2016-03-01 ENCOUNTER — Ambulatory Visit (INDEPENDENT_AMBULATORY_CARE_PROVIDER_SITE_OTHER): Payer: BLUE CROSS/BLUE SHIELD | Admitting: Nurse Practitioner

## 2016-03-01 VITALS — BP 144/88 | HR 82 | Temp 97.5°F | Ht 64.0 in | Wt 235.0 lb

## 2016-03-01 DIAGNOSIS — B029 Zoster without complications: Secondary | ICD-10-CM

## 2016-03-01 NOTE — Progress Notes (Signed)
   Subjective:    Patient ID: [name], female    DOB: 15-Jun-1960, 55 y.o.   MRN: [id number]  HPI Patient in today to recheck shingles- she has been battling shingles since March of 2017.SHe was seen 02/23/16 with a break out on her left shoulder that has spread down to scapula. SHe also has a few new spots on left post knee and right elbow.   Review of Systems  Constitutional: Negative.   HENT: Negative.   Respiratory: Negative.   Cardiovascular: Negative.   Genitourinary: Negative.   Psychiatric/Behavioral: Negative.   All other systems reviewed and are negative.      Objective:   Physical Exam  Constitutional: She appears well-developed and well-nourished.  Cardiovascular: Normal rate and normal heart sounds.   Skin:  Erythematous maculopapular patchy area on left scapula, left post knee and right elbow.   BP (!) 144/88 (BP Location: Left Arm, Cuff Size: Large)   Pulse 82   Temp 97.5 F (36.4 C) (Oral)   Ht 5\' 4"  (1.626 m)   Wt 235 lb (106.6 kg)   BMI 40.34 kg/m          Assessment & Plan:   1. Shingles rash    FMLA forms are bing filled out Patient has morphine allergy so afraid to give her anything at this time- patient will call if pain worsens.  Orders Placed This Encounter  Procedures  . Ambulatory referral to Infectious Disease    Referral Priority:   Routine    Referral Type:   Consultation    Referral Reason:   Specialty Services Required    Requested Specialty:   Infectious Diseases    Number of Visits Requested:   [place], [place]

## 2016-03-07 ENCOUNTER — Telehealth: Payer: Self-pay | Admitting: Nurse Practitioner

## 2016-03-07 NOTE — Telephone Encounter (Signed)
Wanting to speak to only MMM. Dr. Johnnye Sima aware that MMM will be in tomorrow.

## 2016-03-08 NOTE — Telephone Encounter (Signed)
Oops wrong chart no walker need ed-

## 2016-03-08 NOTE — Telephone Encounter (Signed)
rx for walker ready for pick up  

## 2016-03-09 ENCOUNTER — Telehealth: Payer: Self-pay | Admitting: Nurse Practitioner

## 2016-03-09 NOTE — Telephone Encounter (Signed)
Patient has additional forms to fill out and will bring paperwork by the office today for [place] to fill out

## 2016-03-12 ENCOUNTER — Telehealth: Payer: Self-pay | Admitting: Nurse Practitioner

## 2016-03-12 NOTE — Telephone Encounter (Signed)
Please advise  In regards to referral I told the patient infectious disease said it would be several weeks until an appt.

## 2016-03-13 NOTE — Telephone Encounter (Signed)
Patient notified that papers up front ready for pick up. Also extended work note until appt with dermatologist can be made. Patient verbalized understanding.

## 2016-03-14 ENCOUNTER — Other Ambulatory Visit: Payer: Self-pay

## 2016-03-14 DIAGNOSIS — B029 Zoster without complications: Secondary | ICD-10-CM

## 2016-03-16 ENCOUNTER — Telehealth: Payer: Self-pay | Admitting: Nurse Practitioner

## 2016-03-21 NOTE — Telephone Encounter (Signed)
Spoke with patient and was given information that short [employer] disability company needs. Faxed records to them per request and notified patient that this was done.

## 2016-03-26 ENCOUNTER — Telehealth: Payer: Self-pay | Admitting: Nurse Practitioner

## 2016-03-27 ENCOUNTER — Ambulatory Visit: Payer: BLUE CROSS/BLUE SHIELD

## 2016-03-27 NOTE — Telephone Encounter (Signed)
[facility] refaxed

## 2016-04-07 ENCOUNTER — Encounter: Payer: Self-pay | Admitting: Emergency Medicine

## 2016-04-07 ENCOUNTER — Emergency Department
Admission: EM | Admit: 2016-04-07 | Discharge: 2016-04-07 | Disposition: A | Discharge status: Home / Self Care | Payer: BLUE CROSS/BLUE SHIELD | Source: Home / Self Care | Attending: Family Medicine | Admitting: Family Medicine

## 2016-04-07 ENCOUNTER — Emergency Department (INDEPENDENT_AMBULATORY_CARE_PROVIDER_SITE_OTHER): Payer: BLUE CROSS/BLUE SHIELD

## 2016-04-07 DIAGNOSIS — R062 Wheezing: Secondary | ICD-10-CM

## 2016-04-07 DIAGNOSIS — R05 Cough: Secondary | ICD-10-CM | POA: Diagnosis not present

## 2016-04-07 DIAGNOSIS — B9789 Other viral agents as the cause of diseases classified elsewhere: Secondary | ICD-10-CM | POA: Diagnosis not present

## 2016-04-07 DIAGNOSIS — R0600 Dyspnea, unspecified: Secondary | ICD-10-CM

## 2016-04-07 DIAGNOSIS — J9801 Acute bronchospasm: Secondary | ICD-10-CM

## 2016-04-07 DIAGNOSIS — J069 Acute upper respiratory infection, unspecified: Secondary | ICD-10-CM | POA: Diagnosis not present

## 2016-04-07 MED ORDER — TRIAMCINOLONE ACETONIDE 40 MG/ML IJ SUSP
40.0000 mg | Freq: Once | INTRAMUSCULAR | Status: AC
  Administered 2016-04-07: 40 mg via INTRAMUSCULAR

## 2016-04-07 MED ORDER — ALBUTEROL SULFATE HFA 108 (90 BASE) MCG/ACT IN AERS: 2.0000 | INHALATION_SPRAY | RESPIRATORY_TRACT | 0 refills | Status: DC | PRN

## 2016-04-07 MED ORDER — DOXYCYCLINE HYCLATE 100 MG PO CAPS: 100.0000 mg | ORAL_CAPSULE | Freq: Two times a day (BID) | ORAL | 0 refills | Status: DC

## 2016-04-07 MED ORDER — BENZONATATE 200 MG PO CAPS: 200.0000 mg | ORAL_CAPSULE | Freq: Every day | ORAL | 0 refills | Status: DC

## 2016-04-07 NOTE — ED Triage Notes (Signed)
Pt c/o wheezing, coughing x 3 days, unable to sleep @ night.

## 2016-04-07 NOTE — Discharge Instructions (Signed)
Take plain guaifenesin (1200mg  extended release tabs such as Mucinex or Mucinex D) twice daily, with plenty of water, for cough and congestion.  Get adequate rest.   May use Afrin nasal spray (or generic oxymetazoline) twice daily for about 5 days and then discontinue.  Also recommend using saline nasal spray several times daily and saline nasal irrigation (AYR is a common brand).  Use Flonase nasal spray each morning after using Afrin nasal spray and saline nasal irrigation. Try warm salt water gargles for sore throat.  Stop all antihistamines for now, and other non-prescription cough/cold preparations.   Follow-up with family doctor if not improving about  one week.

## 2016-04-07 NOTE — ED Provider Notes (Signed)
[facility] CARE    CSN: [id number] Arrival date & time: 04/07/16  1111     History   Chief Complaint Chief Complaint  Patient presents with  . Wheezing    HPI [name] is a 55 y.o. female.   Patient complains of four day history of typical cold-like symptoms developing over several days, including mild sore throat, sinus congestion, headache, fatigue, hoarseness, and cough.  She states that she often coughs until she gags. She has a past history of asthma as a child, and notes that her colds are often prolonged.  She had pneumonia two years ago.   The history is provided by the patient.    Past Medical History:  Diagnosis Date  . Abdominal discomfort    Chronic abdominal and pelvic pain resulting in significant loss of time from work  . Anxiety    with depression  . Asthma   . Chest pain   . Diabetes mellitus   . Drug overdose 2009   60 Naprosyn, psychiatric admission  . Hyperlipidemia    Severe  . Hypertension   . Hypothyroidism   . Migraines     Patient Active Problem List   Diagnosis Date Noted  . Morbid obesity ([place]) 03/31/2015  . Hyperlipidemia   . Hypothyroidism   . Hypertension   . POLYP, COLON 04/13/2010  . Diabetes ([place]) 04/13/2010  . Anxiety state 04/13/2010  . Depression 04/13/2010  . Asthma 04/13/2010  . Migraines 04/13/2010    Past Surgical History:  Procedure Laterality Date  . ABDOMINAL HYSTERECTOMY  02/2007   + lysis of adhesions  . APPENDECTOMY  1990s   ruptured, late 90s  . BILATERAL OOPHORECTOMY      in 2 surgeries prior to 9/08  . COLONOSCOPY  02/2010    normal upper endoscopy, single colonic polyp,  . VENTRAL HERNIA REPAIR     x3    OB History    No data available       Home Medications    Prior to Admission medications   Medication Sig Start Date End Date Taking? Authorizing Provider  albuterol (PROVENTIL HFA;VENTOLIN HFA) 108 (90 Base) MCG/ACT inhaler Inhale 2 puffs into the lungs every 4  (four) hours as needed for wheezing or shortness of breath. 04/07/16   Kandra Nicolas, MD  atorvastatin (LIPITOR) 40 MG tablet Take 1 tablet (40 mg total) by mouth daily. 07/26/15   Mary-Margaret Hassell Done, FNP  baclofen (LIORESAL) 10 MG tablet Take 1 tablet (10 mg total) by mouth 3 (three) times daily. 09/09/14   Mary-Margaret Hassell Done, FNP  benazepril (LOTENSIN) 20 MG tablet Take 1 tablet (20 mg total) by mouth daily. 07/26/15   Mary-Margaret Hassell Done, FNP  benzonatate (TESSALON) 200 MG capsule Take 1 capsule (200 mg total) by mouth at bedtime. Take as needed for cough 04/07/16   Kandra Nicolas, MD  buPROPion (WELLBUTRIN XL) 150 MG 24 hr tablet Take 3 tablets (450 mg total) by mouth daily. 07/26/15   Mary-Margaret Hassell Done, FNP  doxycycline (VIBRAMYCIN) 100 MG capsule Take 1 capsule (100 mg total) by mouth 2 (two) times daily. Take with food. 04/07/16   Kandra Nicolas, MD  fenofibrate 160 MG tablet Take 1 tablet (160 mg total) by mouth daily. 07/26/15   Mary-Margaret Hassell Done, FNP  glipiZIDE (GLUCOTROL) 10 MG tablet TAKE ONE TABLET BY MOUTH EVERY DAY BEFORE BREAKFAST 07/26/15   Mary-Margaret Hassell Done, FNP  propranolol (INDERAL) 40 MG tablet Take 1 tablet (40 mg total) by mouth 2 (  two) times daily. 07/26/15   Mary-Margaret Hassell Done, FNP  sitaGLIPtin-metformin (JANUMET) 50-1000 MG tablet Take 1 tablet by mouth 2 (two) times daily with a meal. 07/26/15   Mary-Margaret Hassell Done, FNP  sulfamethoxazole-trimethoprim (BACTRIM DS) 800-160 MG tablet Take 1 tablet by mouth 2 (two) times daily. 01/18/16   Fransisca Kaufmann Dettinger, MD  topiramate (TOPAMAX) 50 MG tablet Take 1 tablet (50 mg total) by mouth 2 (two) times daily. 07/26/15   Mary-Margaret Hassell Done, FNP  triamcinolone cream (KENALOG) 0.1 % Apply 1 application topically 2 (two) times daily. 12/06/15   Noland Fordyce, PA-C    Family History No family history on file.  Social History Social History  Substance Use Topics  . Smoking status: Never Smoker  . Smokeless tobacco: Never  Used  . Alcohol use No     Allergies   Aspirin; Bee venom; Benadryl [diphenhydramine hcl]; Morphine and related; and Vicks formula 44 cough-cold pm [dm-apap-cpm]   Review of Systems Review of Systems + sore throat + hoarse + cough No pleuritic pain No wheezing + nasal congestion + post-nasal drainage No sinus pain/pressure No itchy/red eyes No earache No hemoptysis No SOB No fever, + chills No nausea No vomiting No abdominal pain No diarrhea No urinary symptoms No skin rash + fatigue + myalgias + headache Used OTC meds without relief   Physical Exam Triage Vital Signs ED Triage Vitals  Enc Vitals Group     BP 04/07/16 1149 121/72     Pulse Rate 04/07/16 1149 76     Resp --      Temp 04/07/16 1149 98.1 F (36.7 C)     Temp Source 04/07/16 1149 Oral     SpO2 04/07/16 1149 96 %     Weight 04/07/16 1149 235 lb 12 oz (106.9 kg)     Height 04/07/16 1149 5\' 4"  (1.626 m)     Head Circumference --      Peak Flow --      Pain Score 04/07/16 1151 0     Pain Loc --      Pain Edu? --      Excl. in [place]? --    No data found.   Updated Vital Signs BP 121/72 (BP Location: Left Arm)   Pulse 76   Temp 98.1 F (36.7 C) (Oral)   Ht 5\' 4"  (1.626 m)   Wt 235 lb 12 oz (106.9 kg)   SpO2 96%   BMI 40.47 kg/m   Visual Acuity Right Eye Distance:   Left Eye Distance:   Bilateral Distance:    Right Eye Near:   Left Eye Near:    Bilateral Near:     Physical Exam Nursing notes and Vital Signs reviewed. Appearance:  Patient appears stated age, and in no acute distress Eyes:  Pupils are equal, round, and reactive to light and accomodation.  Extraocular movement is intact.   Conjunctivae are not inflamed  Ears:  Canals normal.  Tympanic membranes normal.  Nose:  Mildly congested turbinates.  No sinus tenderness.   Pharynx:  Normal Neck:  Supple.  Tender enlarged posterior/lateral nodes are palpated bilaterally  Lungs:  Clear to auscultation.  Breath sounds are equal.   Moving air well. Chest:  Distinct tenderness to palpation over the mid-sternum.  Heart:  Regular rate and rhythm without murmurs, rubs, or gallops.  Abdomen:  Nontender without masses or hepatosplenomegaly.  Bowel sounds are present.  No CVA or flank tenderness.  Extremities:  No edema.  Skin:  No  rash present.    UC Treatments / Results  Labs (all labs ordered are listed, but only abnormal results are displayed) Labs Reviewed - No data to display  EKG  EKG Interpretation None       Radiology Dg Chest 2 View  Result Date: 04/07/2016 CLINICAL DATA:  Cough, dyspnea, wheezing x4 days EXAM: CHEST  2 VIEW COMPARISON:  12/03/2012 FINDINGS: Lungs are clear.  No pleural effusion or pneumothorax. The heart is normal in size. Mild degenerative changes of the visualized thoracolumbar spine. IMPRESSION: No evidence of acute cardiopulmonary disease. Electronically Signed   By: Julian Hy M.D.   On: 04/07/2016 13:15    Procedures Procedures (including critical care time)  Medications Ordered in UC Medications  triamcinolone acetonide (KENALOG-40) injection 40 mg (not administered)     Initial Impression / Assessment and Plan / UC Course  I have reviewed the triage vital signs and the nursing notes.  Pertinent labs & imaging results that were available during my care of the patient were reviewed by me and considered in my medical decision making (see chart for details).  Clinical Course  Suspect mild reactive airways disease that is only manifested during viral respiratory illnesses.  Administered Kenalog 40mg  IM  Begin Doxycycline for atypical coverage.  Prescription written for Benzonatate [facility]) to take at bedtime for night-time cough.  Rx for albuterol MDI Take plain guaifenesin (1200mg  extended release tabs such as Mucinex or Mucinex D) twice daily, with plenty of water, for cough and congestion.  Get adequate rest.   May use Afrin nasal spray (or generic  oxymetazoline) twice daily for about 5 days and then discontinue.  Also recommend using saline nasal spray several times daily and saline nasal irrigation (AYR is a common brand).  Use Flonase nasal spray each morning after using Afrin nasal spray and saline nasal irrigation. Try warm salt water gargles for sore throat.  Stop all antihistamines for now, and other non-prescription cough/cold preparations.   Follow-up with family doctor if not improving about  one week.     Final Clinical Impressions(s) / UC Diagnoses   Final diagnoses:  Viral URI with cough  Bronchospasm    New Prescriptions New Prescriptions   ALBUTEROL (PROVENTIL HFA;VENTOLIN HFA) 108 (90 BASE) MCG/ACT INHALER    Inhale 2 puffs into the lungs every 4 (four) hours as needed for wheezing or shortness of breath.   BENZONATATE (TESSALON) 200 MG CAPSULE    Take 1 capsule (200 mg total) by mouth at bedtime. Take as needed for cough   DOXYCYCLINE (VIBRAMYCIN) 100 MG CAPSULE    Take 1 capsule (100 mg total) by mouth 2 (two) times daily. Take with food.     Kandra Nicolas, MD 04/15/16 [phone]

## 2016-06-07 ENCOUNTER — Telehealth: Payer: Self-pay | Admitting: Pharmacist

## 2016-06-07 NOTE — Telephone Encounter (Signed)
Patient in need of appointment ASAP for follow up uncontrolled tyep 2 DM.  Left message on VM.

## 2016-06-22 ENCOUNTER — Other Ambulatory Visit: Payer: Self-pay | Admitting: Nurse Practitioner

## 2016-06-22 DIAGNOSIS — G43711 Chronic migraine without aura, intractable, with status migrainosus: Secondary | ICD-10-CM

## 2016-06-22 DIAGNOSIS — I10 Essential (primary) hypertension: Secondary | ICD-10-CM

## 2016-06-22 MED ORDER — BENAZEPRIL HCL 20 MG PO TABS
20.0000 mg | ORAL_TABLET | Freq: Every day | ORAL | 0 refills | Status: DC
Start: 2016-06-22 — End: 2016-06-22

## 2016-06-22 MED ORDER — BUPROPION HCL ER (XL) 150 MG PO TB24: 450.0000 mg | ORAL_TABLET | Freq: Every day | ORAL | 0 refills | Status: DC

## 2016-06-22 MED ORDER — TOPIRAMATE 50 MG PO TABS: 50.0000 mg | ORAL_TABLET | Freq: Two times a day (BID) | ORAL | 0 refills | Status: DC

## 2016-06-22 MED ORDER — BENAZEPRIL HCL 20 MG PO TABS
20.0000 mg | ORAL_TABLET | Freq: Every day | ORAL | 0 refills | Status: DC
Start: 2016-06-22 — End: 2016-06-25

## 2016-06-22 MED ORDER — TOPIRAMATE 50 MG PO TABS
50.0000 mg | ORAL_TABLET | Freq: Two times a day (BID) | ORAL | 0 refills | Status: DC
Start: 2016-06-22 — End: 2016-06-22

## 2016-06-22 NOTE — Telephone Encounter (Signed)
Pt has appt on 06/25/2016 refills sent in for 1 mth only until pt comes in for ov

## 2016-06-22 NOTE — Addendum Note (Signed)
Addended by: Marin Olp on: 06/22/2016 09:42 AM   Modules accepted: Orders

## 2016-06-25 ENCOUNTER — Encounter: Payer: Self-pay | Admitting: Nurse Practitioner

## 2016-06-25 ENCOUNTER — Ambulatory Visit (INDEPENDENT_AMBULATORY_CARE_PROVIDER_SITE_OTHER): Payer: BLUE CROSS/BLUE SHIELD | Admitting: Nurse Practitioner

## 2016-06-25 VITALS — BP 103/61 | HR 60 | Temp 97.0°F | Ht 64.0 in | Wt 236.0 lb

## 2016-06-25 DIAGNOSIS — E039 Hypothyroidism, unspecified: Secondary | ICD-10-CM | POA: Diagnosis not present

## 2016-06-25 DIAGNOSIS — Z1212 Encounter for screening for malignant neoplasm of rectum: Secondary | ICD-10-CM

## 2016-06-25 DIAGNOSIS — G43411 Hemiplegic migraine, intractable, with status migrainosus: Secondary | ICD-10-CM | POA: Diagnosis not present

## 2016-06-25 DIAGNOSIS — I10 Essential (primary) hypertension: Secondary | ICD-10-CM | POA: Diagnosis not present

## 2016-06-25 DIAGNOSIS — F3341 Major depressive disorder, recurrent, in partial remission: Secondary | ICD-10-CM

## 2016-06-25 DIAGNOSIS — E782 Mixed hyperlipidemia: Secondary | ICD-10-CM

## 2016-06-25 DIAGNOSIS — Z1211 Encounter for screening for malignant neoplasm of colon: Secondary | ICD-10-CM

## 2016-06-25 DIAGNOSIS — F411 Generalized anxiety disorder: Secondary | ICD-10-CM | POA: Diagnosis not present

## 2016-06-25 DIAGNOSIS — E1142 Type 2 diabetes mellitus with diabetic polyneuropathy: Secondary | ICD-10-CM | POA: Diagnosis not present

## 2016-06-25 DIAGNOSIS — Z23 Encounter for immunization: Secondary | ICD-10-CM | POA: Diagnosis not present

## 2016-06-25 DIAGNOSIS — G43711 Chronic migraine without aura, intractable, with status migrainosus: Secondary | ICD-10-CM

## 2016-06-25 DIAGNOSIS — Z1231 Encounter for screening mammogram for malignant neoplasm of breast: Secondary | ICD-10-CM

## 2016-06-25 LAB — BAYER DCA HB A1C WAIVED: HB A1C (BAYER DCA - WAIVED): 8.3 % — ABNORMAL HIGH (ref <7.0)

## 2016-06-25 MED ORDER — PROPRANOLOL HCL 40 MG PO TABS: 40.0000 mg | ORAL_TABLET | Freq: Two times a day (BID) | ORAL | 5 refills | Status: DC

## 2016-06-25 MED ORDER — FENOFIBRATE 160 MG PO TABS: 160.0000 mg | ORAL_TABLET | Freq: Every day | ORAL | 5 refills | Status: DC

## 2016-06-25 MED ORDER — TOPIRAMATE 50 MG PO TABS: 50.0000 mg | ORAL_TABLET | Freq: Two times a day (BID) | ORAL | 5 refills | Status: DC

## 2016-06-25 MED ORDER — GLIPIZIDE 10 MG PO TABS: ORAL_TABLET | ORAL | 5 refills | Status: DC

## 2016-06-25 MED ORDER — ATORVASTATIN CALCIUM 40 MG PO TABS: 40.0000 mg | ORAL_TABLET | Freq: Every day | ORAL | 5 refills | Status: DC

## 2016-06-25 MED ORDER — BUPROPION HCL ER (XL) 150 MG PO TB24: 450.0000 mg | ORAL_TABLET | Freq: Every day | ORAL | 1 refills | Status: DC

## 2016-06-25 MED ORDER — CITALOPRAM HYDROBROMIDE 40 MG PO TABS: 40.0000 mg | ORAL_TABLET | Freq: Every day | ORAL | 5 refills | Status: DC

## 2016-06-25 MED ORDER — SITAGLIPTIN PHOS-METFORMIN HCL 50-1000 MG PO TABS: 1.0000 | ORAL_TABLET | Freq: Two times a day (BID) | ORAL | 5 refills | Status: DC

## 2016-06-25 MED ORDER — BENAZEPRIL HCL 20 MG PO TABS: 20.0000 mg | ORAL_TABLET | Freq: Every day | ORAL | 5 refills | Status: DC

## 2016-06-25 NOTE — Patient Instructions (Signed)
Carbohydrate Counting for Diabetes Mellitus, Adult Carbohydrate counting is a method for keeping track of how many carbohydrates you eat. Eating carbohydrates naturally increases the amount of sugar (glucose) in the blood. Counting how many carbohydrates you eat helps keep your blood glucose within normal limits, which helps you manage your diabetes (diabetes mellitus). It is important to know how many carbohydrates you can safely have in each meal. This is different for every person. A diet and nutrition specialist (registered dietitian) can help you make a meal plan and calculate how many carbohydrates you should have at each meal and snack. Carbohydrates are found in the following foods:  Grains, such as breads and cereals.  Dried beans and soy products.  Starchy vegetables, such as potatoes, peas, and corn.  Fruit and fruit juices.  Milk and yogurt.  Sweets and snack foods, such as cake, cookies, candy, chips, and soft drinks. How do I count carbohydrates? There are two ways to count carbohydrates in food. You can use either of the methods or a combination of both. Reading "Nutrition Facts" on packaged food  The "Nutrition Facts" list is included on the labels of almost all packaged foods and beverages in the [place] It includes:  The serving size.  Information about nutrients in each serving, including the grams (g) of carbohydrate per serving. To use the "Nutrition Facts":  Decide how many servings you will have.  Multiply the number of servings by the number of carbohydrates per serving.  The resulting number is the total amount of carbohydrates that you will be having. Learning standard serving sizes of other foods  When you eat foods containing carbohydrates that are not packaged or do not include "Nutrition Facts" on the label, you need to measure the servings in order to count the amount of carbohydrates:  Measure the foods that you will eat with a food scale or measuring  cup, if needed.  Decide how many standard-size servings you will eat.  Multiply the number of servings by 15. Most carbohydrate-rich foods have about 15 g of carbohydrates per serving.  For example, if you eat 8 oz (170 g) of strawberries, you will have eaten 2 servings and 30 g of carbohydrates (2 servings x 15 g = 30 g).  For foods that have more than one food mixed, such as soups and casseroles, you must count the carbohydrates in each food that is included. The following list contains standard serving sizes of common carbohydrate-rich foods. Each of these servings has about 15 g of carbohydrates:   hamburger bun or  English muffin.   oz (15 mL) syrup.   oz (14 g) jelly.  1 slice of bread.  1 six-inch tortilla.  3 oz (85 g) cooked rice or pasta.  4 oz (113 g) cooked dried beans.  4 oz (113 g) starchy vegetable, such as peas, corn, or potatoes.  4 oz (113 g) hot cereal.  4 oz (113 g) mashed potatoes or  of a large baked potato.  4 oz (113 g) canned or frozen fruit.  4 oz (120 mL) fruit juice.  4-6 crackers.  6 chicken nuggets.  6 oz (170 g) unsweetened dry cereal.  6 oz (170 g) plain fat-free yogurt or yogurt sweetened with artificial sweeteners.  8 oz (240 mL) milk.  8 oz (170 g) fresh fruit or one small piece of fruit.  24 oz (680 g) popped popcorn. Example of carbohydrate counting Sample meal  3 oz (85 g) chicken breast.  6 oz (  170 g) brown rice.  4 oz (113 g) corn.  8 oz (240 mL) milk.  8 oz (170 g) strawberries with sugar-free whipped topping. Carbohydrate calculation 1. Identify the foods that contain carbohydrates:  Rice.  Corn.  Milk.  Strawberries. 2. Calculate how many servings you have of each food:  2 servings rice.  1 serving corn.  1 serving milk.  1 serving strawberries. 3. Multiply each number of servings by 15 g:  2 servings rice x 15 g = 30 g.  1 serving corn x 15 g = 15 g.  1 serving milk x 15 g = 15  g.  1 serving strawberries x 15 g = 15 g. 4. Add together all of the amounts to find the total grams of carbohydrates eaten:  30 g + 15 g + 15 g + 15 g = 75 g of carbohydrates total. This information is not intended to replace advice given to you by your health care provider. Make sure you discuss any questions you have with your health care provider. Document Released: 05/28/2005 Document Revised: 12/16/2015 Document Reviewed: 11/09/2015 Elsevier Interactive Patient Education  2017 [employer]  

## 2016-06-25 NOTE — Addendum Note (Signed)
Addended by: Chevis Pretty on: 06/25/2016 04:46 PM   Modules accepted: Orders

## 2016-06-25 NOTE — Progress Notes (Addendum)
Subjective:    Patient ID: [name], female    DOB: [birth year], 56 y.o.   MRN: [id number]   Patient here today for follow up of chronic medical problems.  Outpatient Encounter Prescriptions as of 07/26/2015  Medication Sig  . atorvastatin (LIPITOR) 40 MG tablet Take 1 tablet (40 mg total) by mouth daily.  . baclofen (LIORESAL) 10 MG tablet Take 1 tablet (10 mg total) by mouth 3 (three) times daily.  . benazepril (LOTENSIN) 20 MG tablet Take 1 tablet (20 mg total) by mouth daily.  Marland Kitchen buPROPion (WELLBUTRIN XL) 150 MG 24 hr tablet Take 3 tablets (450 mg total) by mouth daily.          . fenofibrate 160 MG tablet Take 1 tablet (160 mg total) by mouth daily.  Marland Kitchen glipiZIDE (GLUCOTROL) 10 MG tablet TAKE ONE TABLET BY MOUTH EVERY DAY BEFORE BREAKFAST  . ondansetron (ZOFRAN) 4 MG tablet Take 1 tablet (4 mg total) by mouth every 8 (eight) hours as needed for nausea or vomiting.  . propranolol (INDERAL) 40 MG tablet Take 1 tablet (40 mg total) by mouth 2 (two) times daily.  . sitaGLIPtin-metformin (JANUMET) 50-1000 MG tablet Take 1 tablet by mouth 2 (two) times daily with a meal.  . topiramate (TOPAMAX) 50 MG tablet Take 1 tablet (50 mg total) by mouth 2 (two) times daily.   No facility-administered encounter medications on file as of 07/26/2015.   * patient has been fighting shingles for most of the past year- finally rash has resolved in mi-October.  Hyperlipidemia  This is a chronic problem. The current episode started more than 1 year ago. The problem is uncontrolled. Recent lipid tests were reviewed and are variable. Exacerbating diseases include diabetes and hypothyroidism. Associated symptoms include shortness of breath (related to seasonal allergies). Pertinent negatives include no chest pain. Current antihyperlipidemic treatment includes statins. The current treatment provides moderate improvement of lipids. Compliance problems include adherence to diet and adherence to exercise.   Risk factors for coronary artery disease include diabetes mellitus, dyslipidemia, hypertension, obesity and post-menopausal.  Hypertension  This is a chronic problem. The current episode started more than 1 year ago. The problem is uncontrolled. Associated symptoms include shortness of breath (related to seasonal allergies). Pertinent negatives include no chest pain or palpitations. Risk factors for coronary artery disease include diabetes mellitus, dyslipidemia, obesity and post-menopausal state. Past treatments include ACE inhibitors. The current treatment provides moderate improvement. Compliance problems include diet and exercise.  Hypertensive end-organ damage includes a thyroid problem.  Diabetes  She presents for her follow-up diabetic visit. She has type 2 diabetes mellitus. No MedicAlert identification noted. Her disease course has been stable. Pertinent negatives for hypoglycemia include no dizziness or seizures. Associated symptoms include fatigue. Pertinent negatives for diabetes include no chest pain and no visual change. Risk factors for coronary artery disease include diabetes mellitus, dyslipidemia, family history, hypertension and obesity. Current diabetic treatment includes oral agent (dual therapy). She is compliant with treatment all of the time. Her weight is stable. She is following a generally unhealthy diet. She has not had a previous visit with a dietitian. She rarely participates in exercise. (Not checking blood sugars- knows they ran high while she was fighting shingles.) An ACE inhibitor/angiotensin II receptor blocker is being taken. She does not see a podiatrist.Eye exam is current.  Migraine   This is a chronic problem. The current episode started more than 1 year ago. The problem occurs seasonly. Pertinent negatives include no dizziness,  numbness, seizures or visual change. The symptoms are aggravated by weather changes. Her past medical history is significant for hypertension.   Thyroid Problem  Symptoms include fatigue. Patient reports no constipation, diaphoresis, diarrhea, menstrual problem, palpitations or visual change. Her past medical history is significant for diabetes and hyperlipidemia.  depression/anxiety Patient has been taking wellbutrin for several years- working well for her- she does well when she has a job and she is currently working which really helps. Depression screen [facility] 2/9 06/25/2016 03/01/2016 02/23/2016 01/18/2016 10/28/2015  Decreased Interest 0 0 0 0 1  Down, Depressed, Hopeless 0 0 0 0 0  PHQ - 2 Score 0 0 0 0 1       Review of Systems  Constitutional: Positive for fatigue. Negative for activity change, appetite change, diaphoresis and unexpected weight change.  Respiratory: Positive for shortness of breath (related to seasonal allergies) and wheezing.   Cardiovascular: Negative for chest pain and palpitations.  Gastrointestinal: Negative for constipation and diarrhea.  Genitourinary: Negative for menstrual problem.  Musculoskeletal: Negative.   Neurological: Negative for dizziness, seizures and numbness.  Psychiatric/Behavioral: Negative.   All other systems reviewed and are negative.      Objective:   Physical Exam  Constitutional: She is oriented to person, place, and time. She appears well-developed and well-nourished. No distress.  HENT:  Right Ear: External ear normal.  Left Ear: External ear normal.  Nose: Nose normal.  Mouth/Throat: Oropharynx is clear and moist. No oropharyngeal exudate.  Eyes: Conjunctivae and EOM are normal. Pupils are equal, round, and reactive to light.  Neck: Trachea normal, normal range of motion and full passive range of motion without pain. Neck supple. No JVD present. Carotid bruit is not present. No thyromegaly present.  Cardiovascular: Normal rate, regular rhythm, normal heart sounds and intact distal pulses.  Exam reveals no gallop and no friction rub.   No murmur heard. Pulmonary/Chest:  Effort normal. She has wheezes.  Abdominal: Soft. Bowel sounds are normal. She exhibits no distension and no mass. There is no tenderness.  Musculoskeletal: Normal range of motion.  Lymphadenopathy:    She has no cervical adenopathy.  Neurological: She is alert and oriented to person, place, and time. She has normal reflexes. No cranial nerve deficit.  Skin: Skin is warm and dry. She is not diaphoretic.  Psychiatric: She has a normal mood and affect. Her behavior is normal. Judgment and thought content normal.   BP 103/61   Pulse 60   Temp 97 F (36.1 C) (Oral)   Ht 5' 4"  (1.626 m)   Wt 236 lb (107 kg)   BMI 40.51 kg/m    Hgba1c 8.3% same as previous     Assessment & Plan:  1. Essential hypertension *low sodium diet - benazepril (LOTENSIN) 20 MG tablet; Take 1 tablet (20 mg total) by mouth daily.  Dispense: 30 tablet; Refill: 5 - CMP14+EGFR  2. Intractable hemiplegic migraine with status migrainosus  3. Type 2 diabetes mellitus with diabetic polyneuropathy, without long-term current use of insulin (HCC) Stricter carb counting- will make no changes to meds today - sitaGLIPtin-metformin (JANUMET) 50-1000 MG tablet; Take 1 tablet by mouth 2 (two) times daily with a meal.  Dispense: 60 tablet; Refill: 5 - glipiZIDE (GLUCOTROL) 10 MG tablet; TAKE ONE TABLET BY MOUTH EVERY DAY BEFORE BREAKFAST  Dispense: 30 tablet; Refill: 5 - Bayer DCA Hb A1c Waived  4. Hypothyroidism, unspecified type  5. Anxiety state Stress management  6. Recurrent major depressive disorder, in partial  remission ([place]) Changed to celexa 78m daily -D/C buPROPion (WELLBUTRIN XL) 150 MG 24 hr tablet; Take 3 tablets (450 mg total) by mouth daily.  Dispense: 90 tablet; Refill: 1  7. Mixed hyperlipidemia Low fat diet - atorvastatin (LIPITOR) 40 MG tablet; Take 1 tablet (40 mg total) by mouth daily.  Dispense: 30 tablet; Refill: 5 - fenofibrate 160 MG tablet; Take 1 tablet (160 mg total) by mouth daily.   Dispense: 30 tablet; Refill: 5 - Lipid panel  8. Morbid obesity ([place] Discussed diet and exercise for person with BMI >25 Will recheck weight in 3-6 months  9. Intractable chronic migraine without aura and with status migrainosus Avoid caffeine - propranolol (INDERAL) 40 MG tablet; Take 1 tablet (40 mg total) by mouth 2 (two) times daily.  Dispense: 60 tablet; Refill: 5 - topiramate (TOPAMAX) 50 MG tablet; Take 1 tablet (50 mg total) by mouth 2 (two) times daily.  Dispense: 60 tablet; Refill: 5   hemoccult cards given to patient with directions Labs pending Health maintenance reviewed Diet and exercise encouraged Continue all meds Follow up  In 3 months   [place] FNP

## 2016-06-26 LAB — CMP14+EGFR
A/G RATIO: 1.8 (ref 1.2–2.2)
ALBUMIN: 4.3 g/dL (ref 3.5–5.5)
ALK PHOS: 77 IU/L (ref 39–117)
ALT: 38 IU/L — ABNORMAL HIGH (ref 0–32)
AST: 17 IU/L (ref 0–40)
BUN / CREAT RATIO: 15 (ref 9–23)
BUN: 14 mg/dL (ref 6–24)
Bilirubin Total: <0.2 mg/dL (ref 0.0–1.2)
CHLORIDE: 101 mmol/L (ref 96–106)
CO2: 21 mmol/L (ref 18–29)
Calcium: 9.2 mg/dL (ref 8.7–10.2)
Creatinine, Ser: 0.91 mg/dL (ref 0.57–1.00)
GFR calc Af Amer: 82 mL/min/{1.73_m2} (ref >59)
GFR calc non Af Amer: 71 mL/min/{1.73_m2} (ref >59)
GLUCOSE: 109 mg/dL — AB (ref 65–99)
Globulin, Total: 2.4 g/dL (ref 1.5–4.5)
POTASSIUM: 4 mmol/L (ref 3.5–5.2)
Sodium: 139 mmol/L (ref 134–144)
Total Protein: 6.7 g/dL (ref 6.0–8.5)

## 2016-06-26 LAB — LIPID PANEL
CHOLESTEROL TOTAL: 260 mg/dL — AB (ref 100–199)
Chol/HDL Ratio: 6.2 ratio units — ABNORMAL HIGH (ref 0.0–4.4)
HDL: 42 mg/dL (ref >39)
LDL Calculated: 162 mg/dL — ABNORMAL HIGH (ref 0–99)
Triglycerides: 280 mg/dL — ABNORMAL HIGH (ref 0–149)
VLDL CHOLESTEROL CAL: 56 mg/dL — AB (ref 5–40)

## 2016-07-04 ENCOUNTER — Encounter: Payer: Self-pay | Admitting: Pediatrics

## 2016-07-04 ENCOUNTER — Ambulatory Visit (INDEPENDENT_AMBULATORY_CARE_PROVIDER_SITE_OTHER): Payer: BLUE CROSS/BLUE SHIELD | Admitting: Pediatrics

## 2016-07-04 VITALS — BP 148/84 | HR 89 | Temp 97.5°F | Ht 64.0 in | Wt 226.2 lb

## 2016-07-04 DIAGNOSIS — K529 Noninfective gastroenteritis and colitis, unspecified: Secondary | ICD-10-CM

## 2016-07-04 MED ORDER — ONDANSETRON HCL 4 MG PO TABS: 4.0000 mg | ORAL_TABLET | Freq: Three times a day (TID) | ORAL | 0 refills | Status: DC | PRN

## 2016-07-04 NOTE — Progress Notes (Signed)
  Subjective:   Patient ID: [name], female    DOB: 1961-01-07, 56 y.o.   MRN: [id number] CC: Emesis; Diarrhea; and Abdominal Pain  HPI: [name] is a 56 y.o. female presenting for Emesis; Diarrhea; and Abdominal Pain  Last threw up yesterday 2 days ago threw up 4 times at work before able to leave [place] was twice Every time she ate something would throw it back up Drinking water or unsweet tea, stayed down Diarrhea started at the same time Diarrhea has slowed down Now with abd pain, feels like stabbing pain across lower abd. Not constant Bad pain lasts for seconds Feels like bad cramps Had 5-6 episodes of diarrhea past two days Once today so far No blood in diarrhea or emesis Was waking up at night to get to the bathroom Has felt cold and hot at times No sweats No fevers today Feels tired  Relevant past medical, surgical, family and social history reviewed. Allergies and medications reviewed and updated. History  Smoking Status  . Never Smoker  Smokeless Tobacco  . Never Used   ROS: Per HPI   Objective:    BP (!) 148/84   Pulse 89   Temp 97.5 F (36.4 C) (Oral)   Ht 5\' 4"  (1.626 m)   Wt 226 lb 3.2 oz (102.6 kg)   BMI 38.83 kg/m   Wt Readings from Last 3 Encounters:  07/04/16 226 lb 3.2 oz (102.6 kg)  06/25/16 236 lb (107 kg)  04/07/16 235 lb 12 oz (106.9 kg)    Gen: NAD, alert, cooperative with exam, NCAT EYES: EOMI, no conjunctival injection, or no icterus CV: NRRR, normal S1/S2, no murmur, distal pulses 2+ b/l Resp: CTABL, no wheezes, normal WOB Abd: +BS, soft, mildly tender with palpation lower abdomen, no rebound, ND. no guarding Ext: No edema, warm Neuro: Alert and oriented MSK: normal muscle bulk  Assessment & Plan:  Delayza was seen today for emesis, diarrhea and abdominal pain.  Diagnoses and all orders for this visit:  Gastroenteritis Symptoms all improving other than more cramping in abd today No fevers Well  hydrated on exam Still feels slightly nauseous Discussed at length return precautions, expect to continue to get better Can try below for nausea RTC for worsening pain, fever, other symptoms -     ondansetron (ZOFRAN) 4 MG tablet; Take 1 tablet (4 mg total) by mouth every 8 (eight) hours as needed for nausea or vomiting.   Follow up plan: Return if symptoms worsen or fail to improve. Assunta Found, MD [place]

## 2016-07-05 ENCOUNTER — Telehealth: Payer: Self-pay | Admitting: Nurse Practitioner

## 2016-07-05 ENCOUNTER — Other Ambulatory Visit: Payer: Self-pay | Admitting: Pediatrics

## 2016-07-05 DIAGNOSIS — R1032 Left lower quadrant pain: Secondary | ICD-10-CM

## 2016-07-05 DIAGNOSIS — R1031 Right lower quadrant pain: Secondary | ICD-10-CM

## 2016-07-05 NOTE — Telephone Encounter (Signed)
Pain now constant, sharp, worst on L side Worse with moving Worried it is mesh from her surgeries Some appetite, ate peanut butter toast this morning No more vomiting/nausea/diarrhea Episode of sweating last night, no fever now, not sure if had fever with sweating or not Will order CT scan of abd Any worsening of pain, any fevers, must be seen in ED

## 2016-07-05 NOTE — Progress Notes (Signed)
abd pain is now constant, hurts on the sides, she thinks where the mesh from her surgeries is Worse when she moves, has mostly been sitting/lying in bed today Some appetite, ate peanut butter toast this morning, no nausea or vomiting No fever now Episode last night of sweating, is not sure if she had a fever or not Not having any more vomiting/diarrhea Will order CT scan, if any fever, any worsening pain needs to be seen in ED

## 2016-07-06 ENCOUNTER — Emergency Department (HOSPITAL_COMMUNITY)
Admission: EM | Admit: 2016-07-06 | Discharge: 2016-07-06 | Disposition: A | Discharge status: Home / Self Care | Payer: BLUE CROSS/BLUE SHIELD | Attending: Emergency Medicine | Admitting: Emergency Medicine

## 2016-07-06 ENCOUNTER — Emergency Department (HOSPITAL_COMMUNITY): Payer: BLUE CROSS/BLUE SHIELD

## 2016-07-06 ENCOUNTER — Telehealth: Payer: Self-pay | Admitting: Pediatrics

## 2016-07-06 ENCOUNTER — Encounter (HOSPITAL_COMMUNITY): Payer: Self-pay | Admitting: *Deleted

## 2016-07-06 DIAGNOSIS — R197 Diarrhea, unspecified: Secondary | ICD-10-CM | POA: Insufficient documentation

## 2016-07-06 DIAGNOSIS — E119 Type 2 diabetes mellitus without complications: Secondary | ICD-10-CM | POA: Diagnosis not present

## 2016-07-06 DIAGNOSIS — I10 Essential (primary) hypertension: Secondary | ICD-10-CM | POA: Insufficient documentation

## 2016-07-06 DIAGNOSIS — R1084 Generalized abdominal pain: Secondary | ICD-10-CM | POA: Insufficient documentation

## 2016-07-06 DIAGNOSIS — E039 Hypothyroidism, unspecified: Secondary | ICD-10-CM | POA: Insufficient documentation

## 2016-07-06 DIAGNOSIS — J45909 Unspecified asthma, uncomplicated: Secondary | ICD-10-CM | POA: Diagnosis not present

## 2016-07-06 DIAGNOSIS — Z7984 Long term (current) use of oral hypoglycemic drugs: Secondary | ICD-10-CM | POA: Diagnosis not present

## 2016-07-06 DIAGNOSIS — R109 Unspecified abdominal pain: Secondary | ICD-10-CM

## 2016-07-06 DIAGNOSIS — R112 Nausea with vomiting, unspecified: Secondary | ICD-10-CM | POA: Insufficient documentation

## 2016-07-06 DIAGNOSIS — Z79899 Other long term (current) drug therapy: Secondary | ICD-10-CM | POA: Diagnosis not present

## 2016-07-06 DIAGNOSIS — R1030 Lower abdominal pain, unspecified: Secondary | ICD-10-CM

## 2016-07-06 LAB — URINALYSIS, ROUTINE W REFLEX MICROSCOPIC
BILIRUBIN URINE: NEGATIVE
Glucose, UA: NEGATIVE mg/dL
Hgb urine dipstick: NEGATIVE
Ketones, ur: NEGATIVE mg/dL
Leukocytes, UA: NEGATIVE
Nitrite: NEGATIVE
Protein, ur: 30 mg/dL — AB
SPECIFIC GRAVITY, URINE: 1.027 (ref 1.005–1.030)
pH: 5 (ref 5.0–8.0)

## 2016-07-06 LAB — COMPREHENSIVE METABOLIC PANEL
ALBUMIN: 4.4 g/dL (ref 3.5–5.0)
ALT: 50 U/L (ref 14–54)
ANION GAP: 9 (ref 5–15)
AST: 28 U/L (ref 15–41)
Alkaline Phosphatase: 60 U/L (ref 38–126)
BUN: 18 mg/dL (ref 6–20)
CO2: 25 mmol/L (ref 22–32)
Calcium: 9.6 mg/dL (ref 8.9–10.3)
Chloride: 105 mmol/L (ref 101–111)
Creatinine, Ser: 1.05 mg/dL — ABNORMAL HIGH (ref 0.44–1.00)
GFR calc Af Amer: >60 mL/min (ref >60)
GFR calc non Af Amer: 59 mL/min — ABNORMAL LOW (ref >60)
Glucose, Bld: 100 mg/dL — ABNORMAL HIGH (ref 65–99)
POTASSIUM: 4.5 mmol/L (ref 3.5–5.1)
SODIUM: 139 mmol/L (ref 135–145)
Total Bilirubin: 0.5 mg/dL (ref 0.3–1.2)
Total Protein: 7.8 g/dL (ref 6.5–8.1)

## 2016-07-06 LAB — CBC
HEMATOCRIT: 40.9 % (ref 36.0–46.0)
Hemoglobin: 13.5 g/dL (ref 12.0–15.0)
MCH: 28.7 pg (ref 26.0–34.0)
MCHC: 33 g/dL (ref 30.0–36.0)
MCV: 87 fL (ref 78.0–100.0)
Platelets: 278 10*3/uL (ref 150–400)
RBC: 4.7 MIL/uL (ref 3.87–5.11)
RDW: 13.6 % (ref 11.5–15.5)
WBC: 10.4 10*3/uL (ref 4.0–10.5)

## 2016-07-06 LAB — LIPASE, BLOOD: Lipase: 39 U/L (ref 11–51)

## 2016-07-06 MED ORDER — SODIUM CHLORIDE 0.9 % IV BOLUS (SEPSIS)
500.0000 mL | Freq: Once | INTRAVENOUS | Status: AC
  Administered 2016-07-06: 500 mL via INTRAVENOUS

## 2016-07-06 MED ORDER — TRAMADOL HCL 50 MG PO TABS: 50.0000 mg | ORAL_TABLET | Freq: Four times a day (QID) | ORAL | 0 refills | Status: DC | PRN

## 2016-07-06 MED ORDER — DICYCLOMINE HCL 20 MG PO TABS: ORAL_TABLET | ORAL | 0 refills | Status: DC

## 2016-07-06 MED ORDER — IOPAMIDOL (ISOVUE-300) INJECTION 61%
30.0000 mL | Freq: Once | INTRAVENOUS | Status: AC | PRN
  Administered 2016-07-06: 30 mL via ORAL

## 2016-07-06 MED ORDER — IOPAMIDOL (ISOVUE-300) INJECTION 61%
100.0000 mL | Freq: Once | INTRAVENOUS | Status: AC | PRN
  Administered 2016-07-06: 100 mL via INTRAVENOUS

## 2016-07-06 MED ORDER — ONDANSETRON HCL 4 MG/2ML IJ SOLN
4.0000 mg | Freq: Once | INTRAMUSCULAR | Status: AC
  Administered 2016-07-06: 4 mg via INTRAVENOUS
  Filled 2016-07-06: qty 2

## 2016-07-06 MED ORDER — HYDROMORPHONE HCL 1 MG/ML IJ SOLN
1.0000 mg | Freq: Once | INTRAMUSCULAR | Status: AC
  Administered 2016-07-06: 1 mg via INTRAVENOUS
  Filled 2016-07-06: qty 1

## 2016-07-06 NOTE — Discharge Instructions (Signed)
Follow up with your md next week for recheck.  Return sooner if problems

## 2016-07-06 NOTE — Telephone Encounter (Signed)
Pain is constant, standing up now because feels better when she is moving. Does ease off then gets worse. CT scan went to review with insurance. Discussed options including going to ED now for eval. Pt wants to wait. any worsening of pain, fevers, vomiting needs ot be seen. Has been passing gas. No further diarrhea. Will have pt get labs drawn stat today. If any worsening will go to ED. Cont drinking lots of fluids at home.

## 2016-07-06 NOTE — ED Provider Notes (Signed)
[facility] DEPT Provider Note   CSN: [id number] Arrival date & time: 07/06/16  1434     History   Chief Complaint Chief Complaint  Patient presents with  . Abdominal Pain    HPI [name] is a 56 y.o. female.  Patient states that she has had some vomiting and diarrhea for a few days. But this is improved. She is complaining of lower abdominal pain. She states she has a history of adhesions   The history is provided by the patient. No language interpreter was used.  Abdominal Pain   This is a recurrent problem. The current episode started more than 2 days ago. The problem occurs constantly. The problem has not changed since onset.The pain is associated with an unknown factor. The pain is located in the generalized abdominal region. The quality of the pain is aching. Associated symptoms include diarrhea and vomiting. Pertinent negatives include frequency, hematuria and headaches.    Past Medical History:  Diagnosis Date  . Abdominal discomfort    Chronic abdominal and pelvic pain resulting in significant loss of time from work  . Anxiety    with depression  . Asthma   . Chest pain   . Diabetes mellitus   . Drug overdose 2009   60 Naprosyn, psychiatric admission  . Hyperlipidemia    Severe  . Hypertension   . Hypothyroidism   . Migraines     Patient Active Problem List   Diagnosis Date Noted  . Morbid obesity ([place]) 03/31/2015  . Hyperlipidemia   . Hypothyroidism   . Hypertension   . POLYP, COLON 04/13/2010  . Diabetes ([place]) 04/13/2010  . Anxiety state 04/13/2010  . Depression 04/13/2010  . Asthma 04/13/2010  . Migraines 04/13/2010    Past Surgical History:  Procedure Laterality Date  . ABDOMINAL HYSTERECTOMY  02/2007   + lysis of adhesions  . APPENDECTOMY  1990s   ruptured, late 90s  . BILATERAL OOPHORECTOMY      in 2 surgeries prior to 9/08  . COLONOSCOPY  02/2010    normal upper endoscopy, single colonic polyp,  . VENTRAL HERNIA REPAIR      x3    OB History    No data available       Home Medications    Prior to Admission medications   Medication Sig Start Date End Date Taking? Authorizing Provider  acetaminophen (TYLENOL) 500 MG tablet Take 500 mg by mouth every 6 (six) hours as needed for mild pain or moderate pain.   Yes Historical Provider, MD  albuterol (PROVENTIL HFA;VENTOLIN HFA) 108 (90 Base) MCG/ACT inhaler Inhale 2 puffs into the lungs every 4 (four) hours as needed for wheezing or shortness of breath. 04/07/16  Yes Kandra Nicolas, MD  atorvastatin (LIPITOR) 40 MG tablet Take 1 tablet (40 mg total) by mouth daily. 06/25/16  Yes Mary-Margaret Hassell Done, FNP  benazepril (LOTENSIN) 20 MG tablet Take 1 tablet (20 mg total) by mouth daily. 06/25/16  Yes Mary-Margaret Hassell Done, FNP  buPROPion (WELLBUTRIN XL) 150 MG 24 hr tablet Take 450 mg by mouth daily. 06/22/16  Yes Historical Provider, MD  fenofibrate 160 MG tablet Take 1 tablet (160 mg total) by mouth daily. 06/25/16  Yes Mary-Margaret Hassell Done, FNP  glipiZIDE (GLUCOTROL) 10 MG tablet TAKE ONE TABLET BY MOUTH EVERY DAY BEFORE BREAKFAST 06/25/16  Yes Mary-Margaret Hassell Done, FNP  ondansetron (ZOFRAN) 4 MG tablet Take 1 tablet (4 mg total) by mouth every 8 (eight) hours as needed for nausea or vomiting. 07/04/16  Yes Eustaquio Maize, MD  propranolol (INDERAL) 40 MG tablet Take 1 tablet (40 mg total) by mouth 2 (two) times daily. 06/25/16  Yes Mary-Margaret Hassell Done, FNP  sitaGLIPtin-metformin (JANUMET) 50-1000 MG tablet Take 1 tablet by mouth 2 (two) times daily with a meal. 06/25/16  Yes Mary-Margaret Hassell Done, FNP  topiramate (TOPAMAX) 50 MG tablet Take 1 tablet (50 mg total) by mouth 2 (two) times daily. 06/25/16  Yes Mary-Margaret Hassell Done, FNP  citalopram (CELEXA) 40 MG tablet Take 1 tablet (40 mg total) by mouth daily. 06/25/16   Mary-Margaret Hassell Done, FNP  dicyclomine (BENTYL) 20 MG tablet Take one every 6-8hours for abd cramps 07/06/16   Milton Ferguson, MD  traMADol (ULTRAM) 50 MG  tablet Take 1 tablet (50 mg total) by mouth every 6 (six) hours as needed. 07/06/16   Milton Ferguson, MD    Family History No family history on file.  Social History Social History  Substance Use Topics  . Smoking status: Never Smoker  . Smokeless tobacco: Never Used  . Alcohol use No     Allergies   Aspirin; Bee venom; Benadryl [diphenhydramine hcl]; Morphine and related; and Vicks formula 44 cough-cold pm [dm-apap-cpm]   Review of Systems Review of Systems  Constitutional: Negative for appetite change and fatigue.  HENT: Negative for congestion, ear discharge and sinus pressure.   Eyes: Negative for discharge.  Respiratory: Negative for cough.   Cardiovascular: Negative for chest pain.  Gastrointestinal: Positive for abdominal pain, diarrhea and vomiting.  Genitourinary: Negative for frequency and hematuria.  Musculoskeletal: Negative for back pain.  Skin: Negative for rash.  Neurological: Negative for seizures and headaches.  Psychiatric/Behavioral: Negative for hallucinations.     Physical Exam Updated Vital Signs BP 112/60 (BP Location: Right Arm)   Pulse 64   Temp 97.4 F (36.3 C) (Temporal)   Resp 18   Ht 5\' 4"  (1.626 m)   Wt 226 lb (102.5 kg)   SpO2 100%   BMI 38.79 kg/m   Physical Exam  Constitutional: She is oriented to person, place, and time. She appears well-developed.  HENT:  Head: Normocephalic.  Eyes: Conjunctivae and EOM are normal. No scleral icterus.  Neck: Neck supple. No thyromegaly present.  Cardiovascular: Normal rate and regular rhythm.  Exam reveals no gallop and no friction rub.   No murmur heard. Pulmonary/Chest: No stridor. She has no wheezes. She has no rales. She exhibits no tenderness.  Abdominal: She exhibits no distension. There is tenderness. There is no rebound.  Mild tenderness throughout abdomen. Worse and lower abdomen  Musculoskeletal: Normal range of motion. She exhibits no edema.  Lymphadenopathy:    She has no  cervical adenopathy.  Neurological: She is oriented to person, place, and time. She exhibits normal muscle tone. Coordination normal.  Skin: No rash noted. No erythema.  Psychiatric: She has a normal mood and affect. Her behavior is normal.     ED Treatments / Results  Labs (all labs ordered are listed, but only abnormal results are displayed) Labs Reviewed  COMPREHENSIVE METABOLIC PANEL - Abnormal; Notable for the following:       Result Value   Glucose, Bld 100 (*)    Creatinine, Ser 1.05 (*)    GFR calc non Af Amer 59 (*)    All other components within normal limits  URINALYSIS, ROUTINE W REFLEX MICROSCOPIC - Abnormal; Notable for the following:    APPearance HAZY (*)    Protein, ur 30 (*)    Bacteria, UA FEW (*)  All other components within normal limits  LIPASE, BLOOD  CBC    EKG  EKG Interpretation None       Radiology Ct Abdomen Pelvis W Contrast  Result Date: 07/06/2016 CLINICAL DATA:  Lower abdominal pain with nausea and vomiting 5 days. Diarrhea. EXAM: CT ABDOMEN AND PELVIS WITH CONTRAST TECHNIQUE: Multidetector CT imaging of the abdomen and pelvis was performed using the standard protocol following bolus administration of intravenous contrast. CONTRAST:  51mL ISOVUE-300 IOPAMIDOL (ISOVUE-300) INJECTION 61%, 177mL ISOVUE-300 IOPAMIDOL (ISOVUE-300) INJECTION 61% COMPARISON:  02/20/2010 FINDINGS: Lower chest: The lung bases are within normal. Hepatobiliary: Gallbladder, liver and biliary tree are within normal. Pancreas: Within normal. Spleen: Within normal. Adrenals/Urinary Tract: Adrenal glands are normal. Kidneys are normal in size. Punctate stone over the mid pole collecting system of the left kidney. No hydronephrosis or focal mass. Ureters are normal. Bladder is decompressed and otherwise within normal. Stomach/Bowel: Stomach and small bowel are within normal. Previous appendectomy. Subtle diverticulosis of the colon. Vascular/Lymphatic: Minimal calcified plaque  over the abdominal aorta and iliac arteries. Remaining vascular structures are within normal. No evidence of adenopathy. Reproductive: Previous hysterectomy.  Ovaries not visualized. Other: No free fluid or focal inflammatory change. Surgical clips over the abdominal wall compatible previous ventral hernia repair. Musculoskeletal: Mild degenerate change of the spine and hips. IMPRESSION: No acute findings in the abdomen/pelvis. Punctate nonobstructing left renal stone. Minimal diverticulosis of the colon. Evidence of previous ventral hernia repair. Aortic atherosclerosis. Electronically Signed   By: Marin Olp M.D.   On: 07/06/2016 21:29    Procedures Procedures (including critical care time)  Medications Ordered in ED Medications  sodium chloride 0.9 % bolus 500 mL (0 mLs Intravenous Stopped 07/06/16 2106)  HYDROmorphone (DILAUDID) injection 1 mg (1 mg Intravenous Given 07/06/16 1900)  ondansetron (ZOFRAN) injection 4 mg (4 mg Intravenous Given 07/06/16 1900)  iopamidol (ISOVUE-300) 61 % injection 100 mL (100 mLs Intravenous Contrast Given 07/06/16 2038)  iopamidol (ISOVUE-300) 61 % injection 30 mL (30 mLs Oral Contrast Given 07/06/16 2038)     Initial Impression / Assessment and Plan / ED Course  I have reviewed the triage vital signs and the nursing notes.  Pertinent labs & imaging results that were available during my care of the patient were reviewed by me and considered in my medical decision making (see chart for details).     Patient with nausea vomiting diarrhea and abdominal pain. The nausea vomiting and diarrhea has improved. The abdominal pain improved with pain medicines. Labs unremarkable CT of abdomen unremarkable. Pain could be related to viral infection or adhesions. She is sent home with Bentyl and Ultram will follow-up with her PCP Final Clinical Impressions(s) / ED Diagnoses   Final diagnoses:  Lower abdominal pain    New Prescriptions New Prescriptions   DICYCLOMINE  (BENTYL) 20 MG TABLET    Take one every 6-8hours for abd cramps   TRAMADOL (ULTRAM) 50 MG TABLET    Take 1 tablet (50 mg total) by mouth every 6 (six) hours as needed.     Milton Ferguson, MD 07/06/16 2219

## 2016-07-06 NOTE — ED Triage Notes (Signed)
Pt comes in with lower abdominal pain which includes n/v/d starting Monday. Pt states she stopped vomiting on Wednesday, stopped having diarrhea yesterday. NAD noted. Pt was seen had [facility] on 1/24.

## 2016-07-11 ENCOUNTER — Encounter (HOSPITAL_COMMUNITY): Payer: Self-pay | Admitting: *Deleted

## 2016-07-11 ENCOUNTER — Emergency Department (HOSPITAL_COMMUNITY): Payer: BLUE CROSS/BLUE SHIELD

## 2016-07-11 ENCOUNTER — Emergency Department (HOSPITAL_COMMUNITY)
Admission: EM | Admit: 2016-07-11 | Discharge: 2016-07-11 | Disposition: A | Discharge status: Home / Self Care | Payer: BLUE CROSS/BLUE SHIELD | Attending: Emergency Medicine | Admitting: Emergency Medicine

## 2016-07-11 DIAGNOSIS — E039 Hypothyroidism, unspecified: Secondary | ICD-10-CM | POA: Diagnosis not present

## 2016-07-11 DIAGNOSIS — R197 Diarrhea, unspecified: Secondary | ICD-10-CM

## 2016-07-11 DIAGNOSIS — R109 Unspecified abdominal pain: Secondary | ICD-10-CM

## 2016-07-11 DIAGNOSIS — E119 Type 2 diabetes mellitus without complications: Secondary | ICD-10-CM | POA: Insufficient documentation

## 2016-07-11 DIAGNOSIS — I10 Essential (primary) hypertension: Secondary | ICD-10-CM | POA: Insufficient documentation

## 2016-07-11 DIAGNOSIS — Z79899 Other long term (current) drug therapy: Secondary | ICD-10-CM | POA: Insufficient documentation

## 2016-07-11 DIAGNOSIS — J45909 Unspecified asthma, uncomplicated: Secondary | ICD-10-CM | POA: Diagnosis not present

## 2016-07-11 DIAGNOSIS — R1030 Lower abdominal pain, unspecified: Secondary | ICD-10-CM | POA: Diagnosis present

## 2016-07-11 DIAGNOSIS — Z7984 Long term (current) use of oral hypoglycemic drugs: Secondary | ICD-10-CM | POA: Insufficient documentation

## 2016-07-11 DIAGNOSIS — K5732 Diverticulitis of large intestine without perforation or abscess without bleeding: Secondary | ICD-10-CM | POA: Insufficient documentation

## 2016-07-11 LAB — CBC WITH DIFFERENTIAL/PLATELET
BASOS PCT: 0 %
Basophils Absolute: 0 10*3/uL (ref 0.0–0.1)
EOS ABS: 0.1 10*3/uL (ref 0.0–0.7)
EOS PCT: 1 %
HCT: 39 % (ref 36.0–46.0)
Hemoglobin: 12.9 g/dL (ref 12.0–15.0)
LYMPHS ABS: 1.7 10*3/uL (ref 0.7–4.0)
Lymphocytes Relative: 16 %
MCH: 28.4 pg (ref 26.0–34.0)
MCHC: 33.1 g/dL (ref 30.0–36.0)
MCV: 85.9 fL (ref 78.0–100.0)
MONO ABS: 0.8 10*3/uL (ref 0.1–1.0)
MONOS PCT: 7 %
Neutro Abs: 8 10*3/uL — ABNORMAL HIGH (ref 1.7–7.7)
Neutrophils Relative %: 76 %
PLATELETS: 218 10*3/uL (ref 150–400)
RBC: 4.54 MIL/uL (ref 3.87–5.11)
RDW: 13.7 % (ref 11.5–15.5)
WBC: 10.5 10*3/uL (ref 4.0–10.5)

## 2016-07-11 LAB — C DIFFICILE QUICK SCREEN W PCR REFLEX
C DIFFICLE (CDIFF) ANTIGEN: NEGATIVE
C Diff interpretation: NOT DETECTED
C Diff toxin: NEGATIVE

## 2016-07-11 LAB — COMPREHENSIVE METABOLIC PANEL
ALBUMIN: 4.3 g/dL (ref 3.5–5.0)
ALK PHOS: 51 U/L (ref 38–126)
ALT: 35 U/L (ref 14–54)
AST: 19 U/L (ref 15–41)
Anion gap: 9 (ref 5–15)
BILIRUBIN TOTAL: 0.7 mg/dL (ref 0.3–1.2)
BUN: 18 mg/dL (ref 6–20)
CALCIUM: 9.5 mg/dL (ref 8.9–10.3)
CO2: 24 mmol/L (ref 22–32)
CREATININE: 0.9 mg/dL (ref 0.44–1.00)
Chloride: 102 mmol/L (ref 101–111)
GFR calc Af Amer: >60 mL/min (ref >60)
GFR calc non Af Amer: >60 mL/min (ref >60)
GLUCOSE: 192 mg/dL — AB (ref 65–99)
Potassium: 3.8 mmol/L (ref 3.5–5.1)
Sodium: 135 mmol/L (ref 135–145)
TOTAL PROTEIN: 7.2 g/dL (ref 6.5–8.1)

## 2016-07-11 LAB — URINALYSIS, ROUTINE W REFLEX MICROSCOPIC
BILIRUBIN URINE: NEGATIVE
Glucose, UA: NEGATIVE mg/dL
HGB URINE DIPSTICK: NEGATIVE
KETONES UR: NEGATIVE mg/dL
Leukocytes, UA: NEGATIVE
NITRITE: NEGATIVE
PROTEIN: NEGATIVE mg/dL
SPECIFIC GRAVITY, URINE: 1.018 (ref 1.005–1.030)
pH: 5 (ref 5.0–8.0)

## 2016-07-11 LAB — LIPASE, BLOOD: Lipase: 29 U/L (ref 11–51)

## 2016-07-11 MED ORDER — OXYCODONE-ACETAMINOPHEN 5-325 MG PO TABS: 0.5000 | ORAL_TABLET | ORAL | 0 refills | Status: DC | PRN

## 2016-07-11 MED ORDER — SODIUM CHLORIDE 0.9 % IV BOLUS (SEPSIS)
1000.0000 mL | Freq: Once | INTRAVENOUS | Status: AC
  Administered 2016-07-11: 1000 mL via INTRAVENOUS

## 2016-07-11 MED ORDER — HYDROMORPHONE HCL 1 MG/ML IJ SOLN
1.0000 mg | Freq: Once | INTRAMUSCULAR | Status: AC
  Administered 2016-07-11: 1 mg via INTRAVENOUS
  Filled 2016-07-11: qty 1

## 2016-07-11 MED ORDER — IOPAMIDOL (ISOVUE-300) INJECTION 61%
100.0000 mL | Freq: Once | INTRAVENOUS | Status: AC | PRN
  Administered 2016-07-11: 100 mL via INTRAVENOUS

## 2016-07-11 MED ORDER — CIPROFLOXACIN HCL 500 MG PO TABS: 500.0000 mg | ORAL_TABLET | Freq: Two times a day (BID) | ORAL | 0 refills | Status: DC

## 2016-07-11 MED ORDER — CIPROFLOXACIN IN D5W 400 MG/200ML IV SOLN
400.0000 mg | Freq: Once | INTRAVENOUS | Status: AC
  Administered 2016-07-11: 400 mg via INTRAVENOUS
  Filled 2016-07-11: qty 200

## 2016-07-11 MED ORDER — METRONIDAZOLE 500 MG PO TABS
500.0000 mg | ORAL_TABLET | Freq: Once | ORAL | Status: AC
  Administered 2016-07-11: 500 mg via ORAL
  Filled 2016-07-11: qty 1

## 2016-07-11 MED ORDER — FENTANYL CITRATE (PF) 100 MCG/2ML IJ SOLN
50.0000 ug | Freq: Once | INTRAMUSCULAR | Status: AC
  Administered 2016-07-11: 50 ug via INTRAVENOUS
  Filled 2016-07-11: qty 2

## 2016-07-11 MED ORDER — METRONIDAZOLE 500 MG PO TABS: 500.0000 mg | ORAL_TABLET | Freq: Two times a day (BID) | ORAL | 0 refills | Status: DC

## 2016-07-11 MED ORDER — IOPAMIDOL (ISOVUE-300) INJECTION 61%
INTRAVENOUS | Status: AC
  Administered 2016-07-11: 30 mL
  Filled 2016-07-11: qty 30

## 2016-07-11 MED ORDER — ONDANSETRON HCL 4 MG/2ML IJ SOLN
4.0000 mg | Freq: Once | INTRAMUSCULAR | Status: AC
  Administered 2016-07-11: 4 mg via INTRAVENOUS
  Filled 2016-07-11 (×2): qty 2

## 2016-07-11 MED ORDER — OXYCODONE-ACETAMINOPHEN 5-325 MG PO TABS
1.0000 | ORAL_TABLET | Freq: Once | ORAL | Status: AC
  Administered 2016-07-11: 1 via ORAL
  Filled 2016-07-11: qty 1

## 2016-07-11 MED ORDER — SODIUM CHLORIDE 0.9 % IV BOLUS (SEPSIS)
500.0000 mL | Freq: Once | INTRAVENOUS | Status: AC
  Administered 2016-07-11: 500 mL via INTRAVENOUS

## 2016-07-11 NOTE — ED Provider Notes (Signed)
12:41 PM Assumed care from Dr. Tomi Bamberger, please see their note for full history, physical and decision making until this point. In brief this is a 56 y.o. year old female who presented to the ED tonight with Abdominal Pain     Was here 4 days ago for the same and found diverticulosis on CT scan. Pain is worsened and had some diarrhea as well. Is afebrile and appears well. She has persistent left lower quadrant abdominal tenderness concerning for diverticulitis. Also had recently been on antibiotics, so is waiting for C. difficile study. Plan for CT scan reevaluation and disposition.  Evaluation her pain is somewhat improved with pain medicine. He is not nauseous. Has gotten antibiotics and still appears well. Vital signs are stable. Patient will be stable for discharge with a course of antibiotic therapy along with pain medicine to follow-up with her primary doctor in a few days. Discussed that if her pain were to worsen or not get better within next 2-3 days she needs to return here for further evaluation.  Discharge instructions, including strict return precautions for new or worsening symptoms, given. Patient and/or family verbalized understanding and agreement with the plan as described.   Labs, studies and imaging reviewed by myself and considered in medical decision making if ordered. Imaging interpreted by radiology.  Labs Reviewed  COMPREHENSIVE METABOLIC PANEL - Abnormal; Notable for the following:       Result Value   Glucose, Bld 192 (*)    All other components within normal limits  CBC WITH DIFFERENTIAL/PLATELET - Abnormal; Notable for the following:    Neutro Abs 8.0 (*)    All other components within normal limits  C DIFFICILE QUICK SCREEN W PCR REFLEX  LIPASE, BLOOD  URINALYSIS, ROUTINE W REFLEX MICROSCOPIC       Merrily Pew, MD 07/11/16 1242

## 2016-07-11 NOTE — ED Notes (Signed)
Pt has been up to the bathroom twice, but has not yet been able to produce a stool sample.

## 2016-07-11 NOTE — ED Provider Notes (Addendum)
[facility] DEPT Provider Note   CSN: [id number] Arrival date & time: 07/11/16  0534     History   Chief Complaint Chief Complaint  Patient presents with  . Abdominal Pain    HPI [name] is a 56 y.o. female.  HPI  patient states she started getting lower abdominal pain on January 22. He reports it started out with nausea, vomiting, and diarrhea. She was seen in the ED on January 26 and had a CT scan revealing. She states now since November 28 she has pain in her left side that is constant and sharp. She states movement makes it hurts more, laying still makes it feel better. She states she thinks she had a fever once on the 28th. She has had nausea without vomiting, diarrhea 4-5 times a day that she describes as loose and watery. She denies feeling dizzy or lightheaded. She states she had similar pain before when she had ovarian cysts but they were removed. She states his pain is different from what she had in the ED on the 26. PT told the nurse she had been on Amoxcil for a dental problem prior to getting the abdominal pain and diarrhea.   PCP Mary-Margaret Hassell Done, FNP   Past Medical History:  Diagnosis Date  . Abdominal discomfort    Chronic abdominal and pelvic pain resulting in significant loss of time from work  . Anxiety    with depression  . Asthma   . Chest pain   . Diabetes mellitus   . Drug overdose 2009   60 Naprosyn, psychiatric admission  . Hyperlipidemia    Severe  . Hypertension   . Hypothyroidism   . Migraines     Patient Active Problem List   Diagnosis Date Noted  . Morbid obesity ([place]) 03/31/2015  . Hyperlipidemia   . Hypothyroidism   . Hypertension   . POLYP, COLON 04/13/2010  . Diabetes ([place]) 04/13/2010  . Anxiety state 04/13/2010  . Depression 04/13/2010  . Asthma 04/13/2010  . Migraines 04/13/2010    Past Surgical History:  Procedure Laterality Date  . ABDOMINAL HYSTERECTOMY  02/2007   + lysis of adhesions  .  APPENDECTOMY  1990s   ruptured, late 90s  . BILATERAL OOPHORECTOMY      in 2 surgeries prior to 9/08  . COLONOSCOPY  02/2010    normal upper endoscopy, single colonic polyp,  . VENTRAL HERNIA REPAIR     x3    OB History    No data available       Home Medications    Prior to Admission medications   Medication Sig Start Date End Date Taking? Authorizing Provider  acetaminophen (TYLENOL) 500 MG tablet Take 500 mg by mouth every 6 (six) hours as needed for mild pain or moderate pain.    Historical Provider, MD  albuterol (PROVENTIL HFA;VENTOLIN HFA) 108 (90 Base) MCG/ACT inhaler Inhale 2 puffs into the lungs every 4 (four) hours as needed for wheezing or shortness of breath. 04/07/16   Kandra Nicolas, MD  atorvastatin (LIPITOR) 40 MG tablet Take 1 tablet (40 mg total) by mouth daily. 06/25/16   Mary-Margaret Hassell Done, FNP  benazepril (LOTENSIN) 20 MG tablet Take 1 tablet (20 mg total) by mouth daily. 06/25/16   Mary-Margaret Hassell Done, FNP  buPROPion (WELLBUTRIN XL) 150 MG 24 hr tablet Take 450 mg by mouth daily. 06/22/16   Historical Provider, MD  citalopram (CELEXA) 40 MG tablet Take 1 tablet (40 mg total) by mouth daily.  06/25/16   Mary-Margaret Hassell Done, FNP  dicyclomine (BENTYL) 20 MG tablet Take one every 6-8hours for abd cramps 07/06/16   Milton Ferguson, MD  fenofibrate 160 MG tablet Take 1 tablet (160 mg total) by mouth daily. 06/25/16   Mary-Margaret Hassell Done, FNP  glipiZIDE (GLUCOTROL) 10 MG tablet TAKE ONE TABLET BY MOUTH EVERY DAY BEFORE BREAKFAST 06/25/16   Mary-Margaret Hassell Done, FNP  ondansetron (ZOFRAN) 4 MG tablet Take 1 tablet (4 mg total) by mouth every 8 (eight) hours as needed for nausea or vomiting. 07/04/16   Eustaquio Maize, MD  propranolol (INDERAL) 40 MG tablet Take 1 tablet (40 mg total) by mouth 2 (two) times daily. 06/25/16   Mary-Margaret Hassell Done, FNP  sitaGLIPtin-metformin (JANUMET) 50-1000 MG tablet Take 1 tablet by mouth 2 (two) times daily with a meal. 06/25/16   Mary-Margaret  Hassell Done, FNP  topiramate (TOPAMAX) 50 MG tablet Take 1 tablet (50 mg total) by mouth 2 (two) times daily. 06/25/16   Mary-Margaret Hassell Done, FNP  traMADol (ULTRAM) 50 MG tablet Take 1 tablet (50 mg total) by mouth every 6 (six) hours as needed. 07/06/16   Milton Ferguson, MD    Family History No family history on file.  Social History Social History  Substance Use Topics  . Smoking status: Never Smoker  . Smokeless tobacco: Never Used  . Alcohol use No  employed   Allergies   Aspirin; Bee venom; Benadryl [diphenhydramine hcl]; Morphine and related; and Vicks formula 44 cough-cold pm [dm-apap-cpm]   Review of Systems Review of Systems  All other systems reviewed and are negative.    Physical Exam Updated Vital Signs BP 134/93   Pulse 88   Temp 97.8 F (36.6 C) (Oral)   Resp 16   Ht 5\' 4"  (1.626 m)   Wt 226 lb (102.5 kg)   SpO2 98%   BMI 38.79 kg/m   Vital signs normal    Physical Exam  Constitutional: She is oriented to person, place, and time. She appears well-developed and well-nourished.  Non-toxic appearance. She does not appear ill. No distress.  HENT:  Head: Normocephalic and atraumatic.  Right Ear: External ear normal.  Left Ear: External ear normal.  Nose: Nose normal. No mucosal edema or rhinorrhea.  Mouth/Throat: Oropharynx is clear and moist and mucous membranes are normal. No dental abscesses or uvula swelling.  Eyes: Conjunctivae and EOM are normal. Pupils are equal, round, and reactive to light.  Neck: Normal range of motion and full passive range of motion without pain. Neck supple.  Cardiovascular: Normal rate, regular rhythm and normal heart sounds.  Exam reveals no gallop and no friction rub.   No murmur heard. Pulmonary/Chest: Effort normal and breath sounds normal. No respiratory distress. She has no wheezes. She has no rhonchi. She has no rales. She exhibits no tenderness and no crepitus.  Abdominal: Soft. Normal appearance and bowel sounds are  normal. She exhibits no distension. There is no tenderness. There is no rebound and no guarding.    Very tender in the left lateral mid abdomen with some left tenderness to palpation in the left lower quadrant and the left flank.  Musculoskeletal: Normal range of motion. She exhibits no edema or tenderness.       Back:  Moves all extremities well.   Neurological: She is alert and oriented to person, place, and time. She has normal strength. No cranial nerve deficit.  Skin: Skin is warm, dry and intact. No rash noted. No erythema. No pallor.  Psychiatric: She  has a normal mood and affect. Her speech is normal and behavior is normal. Her mood appears not anxious.  Nursing note and vitals reviewed.    ED Treatments / Results  Labs (all labs ordered are listed, but only abnormal results are displayed) Results for orders placed or performed during the hospital encounter of 07/11/16  Comprehensive metabolic panel  Result Value Ref Range   Sodium 135 135 - 145 mmol/L   Potassium 3.8 3.5 - 5.1 mmol/L   Chloride 102 101 - 111 mmol/L   CO2 24 22 - 32 mmol/L   Glucose, Bld 192 (H) 65 - 99 mg/dL   BUN 18 6 - 20 mg/dL   Creatinine, Ser 0.90 0.44 - 1.00 mg/dL   Calcium 9.5 8.9 - 10.3 mg/dL   Total Protein 7.2 6.5 - 8.1 g/dL   Albumin 4.3 3.5 - 5.0 g/dL   AST 19 15 - 41 U/L   ALT 35 14 - 54 U/L   Alkaline Phosphatase 51 38 - 126 U/L   Total Bilirubin 0.7 0.3 - 1.2 mg/dL   GFR calc non Af Amer >60 >60 mL/min   GFR calc Af Amer >60 >60 mL/min   Anion gap 9 5 - 15  Lipase, blood  Result Value Ref Range   Lipase 29 11 - 51 U/L  Urinalysis, Routine w reflex microscopic  Result Value Ref Range   Color, Urine YELLOW YELLOW   APPearance CLEAR CLEAR   Specific Gravity, Urine 1.018 1.005 - 1.030   pH 5.0 5.0 - 8.0   Glucose, UA NEGATIVE NEGATIVE mg/dL   Hgb urine dipstick NEGATIVE NEGATIVE   Bilirubin Urine NEGATIVE NEGATIVE   Ketones, ur NEGATIVE NEGATIVE mg/dL   Protein, ur NEGATIVE  NEGATIVE mg/dL   Nitrite NEGATIVE NEGATIVE   Leukocytes, UA NEGATIVE NEGATIVE   Laboratory interpretation all normal except hyperglycemia    EKG  EKG Interpretation None       Radiology No results found.   Ct Abdomen Pelvis W Contrast  Result Date: 07/06/2016 CLINICAL DATA:  Lower abdominal pain with nausea and vomiting 5 days. Diarrhea. . IMPRESSION: No acute findings in the abdomen/pelvis. Punctate nonobstructing left renal stone. Minimal diverticulosis of the colon. Evidence of previous ventral hernia repair. Aortic atherosclerosis. Electronically Signed   By: Marin Olp M.D.   On: 07/06/2016 21:29   Procedures Procedures (including critical care time)  Medications Ordered in ED Medications  sodium chloride 0.9 % bolus 500 mL (not administered)  iopamidol (ISOVUE-300) 61 % injection (not administered)  sodium chloride 0.9 % bolus 1,000 mL (0 mLs Intravenous Stopped 07/11/16 0726)  fentaNYL (SUBLIMAZE) injection 50 mcg (50 mcg Intravenous Given 07/11/16 0646)  ondansetron (ZOFRAN) injection 4 mg (4 mg Intravenous Given 07/11/16 0646)     Initial Impression / Assessment and Plan / ED Course  I have reviewed the triage vital signs and the nursing notes.  Pertinent labs & imaging results that were available during my care of the patient were reviewed by me and considered in my medical decision making (see chart for details).    Patient had IV inserted and received IV fluids and IV pain and nausea medication review of her CT scan shows that she did have some diverticulosis. Therefore her CT scan was repeated.  C diff was ordered.   07:30 AM   Pt left at change of shift with Dr Dayna Barker to get her CT results.   Final Clinical Impressions(s) / ED Diagnoses   Final diagnoses:  Abdominal  pain, left lateral  Diarrhea, unspecified type    Disposition pending  Rolland Porter, MD, Barbette Or, MD 07/11/16 [place], MD 07/11/16 [phone]

## 2016-07-11 NOTE — ED Triage Notes (Signed)
Pt c/o left side abd pain that became worse Sunday, was seen in er for abd pain on Friday but states that this pain is different, pt also continues to have diarrhea,

## 2016-07-13 ENCOUNTER — Encounter (HOSPITAL_COMMUNITY): Payer: Self-pay | Admitting: *Deleted

## 2016-07-13 ENCOUNTER — Observation Stay (HOSPITAL_COMMUNITY): Payer: BLUE CROSS/BLUE SHIELD

## 2016-07-13 ENCOUNTER — Inpatient Hospital Stay (HOSPITAL_COMMUNITY)
Admission: AD | Admit: 2016-07-13 | Discharge: 2016-07-16 | DRG: 392 | Disposition: A | Discharge status: Home / Self Care | Payer: BLUE CROSS/BLUE SHIELD | Source: Ambulatory Visit | Attending: Family Medicine | Admitting: Family Medicine

## 2016-07-13 ENCOUNTER — Ambulatory Visit (INDEPENDENT_AMBULATORY_CARE_PROVIDER_SITE_OTHER): Payer: BLUE CROSS/BLUE SHIELD | Admitting: Family Medicine

## 2016-07-13 ENCOUNTER — Encounter: Payer: Self-pay | Admitting: Family Medicine

## 2016-07-13 VITALS — BP 113/63 | HR 57 | Temp 97.0°F | Ht 64.0 in | Wt 228.2 lb

## 2016-07-13 DIAGNOSIS — F3341 Major depressive disorder, recurrent, in partial remission: Secondary | ICD-10-CM | POA: Diagnosis not present

## 2016-07-13 DIAGNOSIS — K5792 Diverticulitis of intestine, part unspecified, without perforation or abscess without bleeding: Secondary | ICD-10-CM

## 2016-07-13 DIAGNOSIS — R1032 Left lower quadrant pain: Secondary | ICD-10-CM | POA: Diagnosis present

## 2016-07-13 DIAGNOSIS — K5732 Diverticulitis of large intestine without perforation or abscess without bleeding: Secondary | ICD-10-CM

## 2016-07-13 DIAGNOSIS — E782 Mixed hyperlipidemia: Secondary | ICD-10-CM

## 2016-07-13 DIAGNOSIS — F329 Major depressive disorder, single episode, unspecified: Secondary | ICD-10-CM | POA: Diagnosis present

## 2016-07-13 DIAGNOSIS — Z79899 Other long term (current) drug therapy: Secondary | ICD-10-CM | POA: Diagnosis not present

## 2016-07-13 DIAGNOSIS — Z91030 Bee allergy status: Secondary | ICD-10-CM

## 2016-07-13 DIAGNOSIS — Z885 Allergy status to narcotic agent status: Secondary | ICD-10-CM | POA: Diagnosis not present

## 2016-07-13 DIAGNOSIS — K5900 Constipation, unspecified: Secondary | ICD-10-CM | POA: Diagnosis present

## 2016-07-13 DIAGNOSIS — E119 Type 2 diabetes mellitus without complications: Secondary | ICD-10-CM | POA: Diagnosis present

## 2016-07-13 DIAGNOSIS — Z886 Allergy status to analgesic agent status: Secondary | ICD-10-CM | POA: Diagnosis not present

## 2016-07-13 DIAGNOSIS — E785 Hyperlipidemia, unspecified: Secondary | ICD-10-CM | POA: Diagnosis present

## 2016-07-13 DIAGNOSIS — E039 Hypothyroidism, unspecified: Secondary | ICD-10-CM | POA: Diagnosis present

## 2016-07-13 DIAGNOSIS — E1169 Type 2 diabetes mellitus with other specified complication: Secondary | ICD-10-CM | POA: Diagnosis present

## 2016-07-13 DIAGNOSIS — I10 Essential (primary) hypertension: Secondary | ICD-10-CM | POA: Diagnosis present

## 2016-07-13 DIAGNOSIS — G43909 Migraine, unspecified, not intractable, without status migrainosus: Secondary | ICD-10-CM | POA: Diagnosis present

## 2016-07-13 DIAGNOSIS — D126 Benign neoplasm of colon, unspecified: Secondary | ICD-10-CM | POA: Diagnosis present

## 2016-07-13 DIAGNOSIS — F32A Depression, unspecified: Secondary | ICD-10-CM | POA: Diagnosis present

## 2016-07-13 DIAGNOSIS — Z7984 Long term (current) use of oral hypoglycemic drugs: Secondary | ICD-10-CM

## 2016-07-13 DIAGNOSIS — J45909 Unspecified asthma, uncomplicated: Secondary | ICD-10-CM | POA: Diagnosis present

## 2016-07-13 DIAGNOSIS — Z888 Allergy status to other drugs, medicaments and biological substances status: Secondary | ICD-10-CM

## 2016-07-13 DIAGNOSIS — K5790 Diverticulosis of intestine, part unspecified, without perforation or abscess without bleeding: Secondary | ICD-10-CM | POA: Diagnosis present

## 2016-07-13 LAB — CBC WITH DIFFERENTIAL/PLATELET
BASOS PCT: 0 %
Basophils Absolute: 0 10*3/uL (ref 0.0–0.1)
Eosinophils Absolute: 0.1 10*3/uL (ref 0.0–0.7)
Eosinophils Relative: 1 %
HEMATOCRIT: 35.8 % — AB (ref 36.0–46.0)
Hemoglobin: 11.6 g/dL — ABNORMAL LOW (ref 12.0–15.0)
Lymphocytes Relative: 30 %
Lymphs Abs: 2.7 10*3/uL (ref 0.7–4.0)
MCH: 28.4 pg (ref 26.0–34.0)
MCHC: 32.4 g/dL (ref 30.0–36.0)
MCV: 87.5 fL (ref 78.0–100.0)
MONO ABS: 0.7 10*3/uL (ref 0.1–1.0)
MONOS PCT: 8 %
NEUTROS ABS: 5.5 10*3/uL (ref 1.7–7.7)
Neutrophils Relative %: 61 %
Platelets: 215 10*3/uL (ref 150–400)
RBC: 4.09 MIL/uL (ref 3.87–5.11)
RDW: 13.5 % (ref 11.5–15.5)
WBC: 9 10*3/uL (ref 4.0–10.5)

## 2016-07-13 LAB — LACTIC ACID, PLASMA
LACTIC ACID, VENOUS: 1.7 mmol/L (ref 0.5–1.9)
Lactic Acid, Venous: 2.2 mmol/L (ref 0.5–1.9)

## 2016-07-13 LAB — COMPREHENSIVE METABOLIC PANEL
ALBUMIN: 3.7 g/dL (ref 3.5–5.0)
ALT: 23 U/L (ref 14–54)
ANION GAP: 8 (ref 5–15)
AST: 18 U/L (ref 15–41)
Alkaline Phosphatase: 51 U/L (ref 38–126)
BILIRUBIN TOTAL: 0.6 mg/dL (ref 0.3–1.2)
BUN: 10 mg/dL (ref 6–20)
CO2: 22 mmol/L (ref 22–32)
Calcium: 9.3 mg/dL (ref 8.9–10.3)
Chloride: 105 mmol/L (ref 101–111)
Creatinine, Ser: 0.89 mg/dL (ref 0.44–1.00)
GFR calc Af Amer: >60 mL/min (ref >60)
Glucose, Bld: 62 mg/dL — ABNORMAL LOW (ref 65–99)
POTASSIUM: 3.8 mmol/L (ref 3.5–5.1)
Sodium: 135 mmol/L (ref 135–145)
TOTAL PROTEIN: 6.8 g/dL (ref 6.5–8.1)

## 2016-07-13 LAB — GLUCOSE, CAPILLARY
Glucose-Capillary: 48 mg/dL — ABNORMAL LOW (ref 65–99)
Glucose-Capillary: 74 mg/dL (ref 65–99)
Glucose-Capillary: 199 mg/dL — ABNORMAL HIGH (ref 65–99)

## 2016-07-13 LAB — TSH: TSH: 3.813 u[IU]/mL (ref 0.350–4.500)

## 2016-07-13 MED ORDER — IOPAMIDOL (ISOVUE-300) INJECTION 61%
100.0000 mL | Freq: Once | INTRAVENOUS | Status: AC | PRN
  Administered 2016-07-13: 100 mL via INTRAVENOUS

## 2016-07-13 MED ORDER — ACETAMINOPHEN 500 MG PO TABS: 500.0000 mg | ORAL_TABLET | Freq: Four times a day (QID) | ORAL | Status: DC | PRN

## 2016-07-13 MED ORDER — SODIUM CHLORIDE 0.9% FLUSH: 3.0000 mL | INTRAVENOUS | Status: DC | PRN

## 2016-07-13 MED ORDER — ALBUTEROL SULFATE (2.5 MG/3ML) 0.083% IN NEBU: 2.5000 mg | INHALATION_SOLUTION | RESPIRATORY_TRACT | Status: DC | PRN

## 2016-07-13 MED ORDER — ONDANSETRON HCL 4 MG/2ML IJ SOLN: 4.0000 mg | Freq: Four times a day (QID) | INTRAMUSCULAR | Status: DC | PRN

## 2016-07-13 MED ORDER — SODIUM CHLORIDE 0.9 % IV SOLN: 250.0000 mL | INTRAVENOUS | Status: DC | PRN

## 2016-07-13 MED ORDER — TOPIRAMATE 25 MG PO TABS
50.0000 mg | ORAL_TABLET | Freq: Two times a day (BID) | ORAL | Status: DC
  Administered 2016-07-13 – 2016-07-16 (×6): 50 mg via ORAL
  Filled 2016-07-13 (×11): qty 2

## 2016-07-13 MED ORDER — PROPRANOLOL HCL 20 MG PO TABS
40.0000 mg | ORAL_TABLET | Freq: Two times a day (BID) | ORAL | Status: DC
  Administered 2016-07-14 – 2016-07-16 (×5): 40 mg via ORAL
  Filled 2016-07-13 (×5): qty 2

## 2016-07-13 MED ORDER — PIPERACILLIN-TAZOBACTAM 3.375 G IVPB
3.3750 g | Freq: Three times a day (TID) | INTRAVENOUS | Status: DC
  Administered 2016-07-13 – 2016-07-16 (×9): 3.375 g via INTRAVENOUS
  Filled 2016-07-13 (×14): qty 50

## 2016-07-13 MED ORDER — CITALOPRAM HYDROBROMIDE 20 MG PO TABS: 40.0000 mg | ORAL_TABLET | Freq: Every day | ORAL | Status: DC

## 2016-07-13 MED ORDER — ALBUTEROL SULFATE HFA 108 (90 BASE) MCG/ACT IN AERS: 2.0000 | INHALATION_SPRAY | RESPIRATORY_TRACT | Status: DC | PRN

## 2016-07-13 MED ORDER — SODIUM CHLORIDE 0.9 % IV BOLUS (SEPSIS)
1000.0000 mL | Freq: Once | INTRAVENOUS | Status: AC
  Administered 2016-07-13: 1000 mL via INTRAVENOUS

## 2016-07-13 MED ORDER — SODIUM CHLORIDE 0.9% FLUSH
3.0000 mL | Freq: Two times a day (BID) | INTRAVENOUS | Status: DC
  Administered 2016-07-13 – 2016-07-15 (×6): 3 mL via INTRAVENOUS

## 2016-07-13 MED ORDER — GLUCOSE 40 % PO GEL
ORAL | Status: AC
  Administered 2016-07-13: 37.5 g
  Filled 2016-07-13: qty 1

## 2016-07-13 MED ORDER — METRONIDAZOLE 500 MG PO TABS
500.0000 mg | ORAL_TABLET | Freq: Three times a day (TID) | ORAL | Status: DC
  Administered 2016-07-13 – 2016-07-16 (×9): 500 mg via ORAL
  Filled 2016-07-13 (×9): qty 1

## 2016-07-13 MED ORDER — IOPAMIDOL (ISOVUE-300) INJECTION 61%
INTRAVENOUS | Status: AC
  Administered 2016-07-13: 30 mL via ORAL
  Filled 2016-07-13: qty 30

## 2016-07-13 MED ORDER — BENAZEPRIL HCL 10 MG PO TABS
20.0000 mg | ORAL_TABLET | Freq: Every day | ORAL | Status: DC
Start: 2016-07-14 — End: 2016-07-16
  Administered 2016-07-14 – 2016-07-16 (×3): 20 mg via ORAL
  Filled 2016-07-13 (×3): qty 2

## 2016-07-13 MED ORDER — METHYLPREDNISOLONE SODIUM SUCC 40 MG IJ SOLR
40.0000 mg | Freq: Once | INTRAMUSCULAR | Status: AC
  Administered 2016-07-13: 40 mg via INTRAVENOUS
  Filled 2016-07-13: qty 1

## 2016-07-13 MED ORDER — INSULIN ASPART 100 UNIT/ML ~~LOC~~ SOLN
0.0000 [IU] | Freq: Three times a day (TID) | SUBCUTANEOUS | Status: DC
  Administered 2016-07-13 – 2016-07-15 (×3): 3 [IU] via SUBCUTANEOUS
  Administered 2016-07-15 (×2): 2 [IU] via SUBCUTANEOUS
  Administered 2016-07-16: 3 [IU] via SUBCUTANEOUS

## 2016-07-13 MED ORDER — FENOFIBRATE 160 MG PO TABS
160.0000 mg | ORAL_TABLET | Freq: Every day | ORAL | Status: DC
  Administered 2016-07-14 – 2016-07-16 (×3): 160 mg via ORAL
  Filled 2016-07-13 (×5): qty 1

## 2016-07-13 MED ORDER — HEPARIN SODIUM (PORCINE) 5000 UNIT/ML IJ SOLN
5000.0000 [IU] | Freq: Three times a day (TID) | INTRAMUSCULAR | Status: DC
  Administered 2016-07-13 – 2016-07-16 (×9): 5000 [IU] via SUBCUTANEOUS
  Filled 2016-07-13 (×9): qty 1

## 2016-07-13 MED ORDER — HYDROMORPHONE HCL 1 MG/ML IJ SOLN
0.5000 mg | INTRAMUSCULAR | Status: DC | PRN
  Administered 2016-07-13 – 2016-07-14 (×11): 0.5 mg via INTRAVENOUS
  Filled 2016-07-13 (×11): qty 1

## 2016-07-13 MED ORDER — ATORVASTATIN CALCIUM 40 MG PO TABS
40.0000 mg | ORAL_TABLET | Freq: Every day | ORAL | Status: DC
  Administered 2016-07-14 – 2016-07-16 (×3): 40 mg via ORAL
  Filled 2016-07-13 (×3): qty 1

## 2016-07-13 MED ORDER — DEXTROSE 50 % IV SOLN
0.5000 | Freq: Once | INTRAVENOUS | Status: AC
  Administered 2016-07-13: 25 mL via INTRAVENOUS
  Filled 2016-07-13: qty 50

## 2016-07-13 MED ORDER — BUPROPION HCL ER (XL) 300 MG PO TB24
450.0000 mg | ORAL_TABLET | Freq: Every day | ORAL | Status: DC
  Administered 2016-07-14 – 2016-07-15 (×2): 450 mg via ORAL
  Administered 2016-07-16: 09:00:00 300 mg via ORAL
  Filled 2016-07-13 (×5): qty 1

## 2016-07-13 NOTE — Progress Notes (Signed)
Hypoglycemic Event  CBG: 48  Treatment: 1 tube instant glucose, 15 GM carbohydrate snack and D50 IV 25 mL  Symptoms: None  Follow-up CBG: Time: 12:50 CBG Result: 74  Possible Reasons for Event: Unknown  Comments/MD notified: Dr. Wynetta Emery notified    Heath Badon, [facility]

## 2016-07-13 NOTE — Patient Instructions (Signed)
Great to meet you!  Please go to admitting at [facility]

## 2016-07-13 NOTE — H&P (Signed)
History and Physical  [name] [id number] DOB: 1961-04-12 DOA: 07/13/2016  Referring physician: Wendi Snipes PCP: Chevis Pretty, FNP   Chief Complaint: abdominal pain LLQ  HPI: [name] is a 56 y.o. female who has been suffering with abdominal pain for the past 3 weeks. She's had 2 recent ED visits for evaluation of abdominal pain and diagnosed with mild acute diverticulitis. She had been started on Cipro and Flagyl that was subsequently switched to Augmentin by pharmacy due to interactions with her medications but continues to have worsening abdominal pain and now the pain has relocated to the left upper quadrant and left lower quadrant. The patient had some nausea proximally one week ago but recently has been tolerating clears at home. She denies fever and chills. She denies vomiting. She denies diarrhea. She did have diarrhea several days ago and was tested for C. difficile and it was negative. The patient followed up with her primary care physician Dr. Wendi Snipes earlier this morning and was complaining of worsening symptoms not improving with the oral Augmentin therapy. He called and arrange for a direct admission to the hospital for further evaluation and management. The patient reports that she has been taking her medications as prescribed. She does report that she has been recently constipated in the past 24-48 hours. She reports no significant straining. She denies cough, fever, vision change and rash. She is being admitted for left lower quadrant abdominal pain, diverticulitis that failed outpatient therapy.  Review of Systems: All systems reviewed and apart from history of presenting illness, are negative.  Past Medical History:  Diagnosis Date  . Abdominal discomfort    Chronic abdominal and pelvic pain resulting in significant loss of time from work  . Anxiety    with depression  . Asthma   . Chest pain   . Diabetes mellitus   . Drug overdose 2009   60 Naprosyn, psychiatric admission  . Hyperlipidemia    Severe  . Hypertension   . Hypothyroidism   . Migraines    Past Surgical History:  Procedure Laterality Date  . ABDOMINAL HYSTERECTOMY  02/2007   + lysis of adhesions  . APPENDECTOMY  1990s   ruptured, late 90s  . BILATERAL OOPHORECTOMY      in 2 surgeries prior to 9/08  . COLONOSCOPY  02/2010    normal upper endoscopy, single colonic polyp,  . VENTRAL HERNIA REPAIR     x3   Social History:  reports that she has never smoked. She has never used smokeless tobacco. She reports that she does not drink alcohol or use drugs.   Allergies  Allergen Reactions  . Aspirin Anaphylaxis  . Bee Venom Anaphylaxis  . Benadryl [Diphenhydramine Hcl] Anaphylaxis  . Morphine And Related Shortness Of Breath and Itching  . Vicks Formula 44 Cough-Cold Pm [Dm-Apap-Cpm] Shortness Of Breath and Rash    Not anaphylaxis    History reviewed. No pertinent family history.   Prior to Admission medications   Medication Sig Start Date End Date Taking? Authorizing Provider  amoxicillin-clavulanate (AUGMENTIN) 875-125 MG tablet Take 1 tablet by mouth 2 (two) times daily. 07/11/16  Yes Historical Provider, MD  atorvastatin (LIPITOR) 40 MG tablet Take 1 tablet (40 mg total) by mouth daily. 06/25/16  Yes Mary-Margaret Hassell Done, FNP  benazepril (LOTENSIN) 20 MG tablet Take 1 tablet (20 mg total) by mouth daily. 06/25/16  Yes Mary-Margaret Hassell Done, FNP  buPROPion (WELLBUTRIN XL) 150 MG 24 hr tablet Take 450 mg by mouth daily.  06/22/16  Yes Historical Provider, MD  ciprofloxacin (CIPRO) 500 MG tablet Take 1 tablet (500 mg total) by mouth 2 (two) times daily. 07/11/16  Yes Merrily Pew, MD  fenofibrate 160 MG tablet Take 1 tablet (160 mg total) by mouth daily. 06/25/16  Yes Mary-Margaret Hassell Done, FNP  glipiZIDE (GLUCOTROL) 10 MG tablet TAKE ONE TABLET BY MOUTH EVERY DAY BEFORE BREAKFAST Patient taking differently: Take 10 mg by mouth daily before breakfast. TAKE ONE  TABLET BY MOUTH EVERY DAY BEFORE BREAKFAST 06/25/16  Yes Mary-Margaret Hassell Done, FNP  metroNIDAZOLE (FLAGYL) 500 MG tablet Take 1 tablet (500 mg total) by mouth 2 (two) times daily. One po bid x 7 days 07/11/16  Yes Merrily Pew, MD  oxyCODONE-acetaminophen (PERCOCET) 5-325 MG tablet Take 0.5-1 tablets by mouth every 4 (four) hours as needed for severe pain. 07/11/16  Yes Merrily Pew, MD  propranolol (INDERAL) 40 MG tablet Take 1 tablet (40 mg total) by mouth 2 (two) times daily. 06/25/16  Yes Mary-Margaret Hassell Done, FNP  sitaGLIPtin-metformin (JANUMET) 50-1000 MG tablet Take 1 tablet by mouth 2 (two) times daily with a meal. 06/25/16  Yes Mary-Margaret Hassell Done, FNP  topiramate (TOPAMAX) 50 MG tablet Take 1 tablet (50 mg total) by mouth 2 (two) times daily. 06/25/16  Yes Mary-Margaret Hassell Done, FNP  acetaminophen (TYLENOL) 500 MG tablet Take 500 mg by mouth every 6 (six) hours as needed for mild pain or moderate pain.    Historical Provider, MD  albuterol (PROVENTIL HFA;VENTOLIN HFA) 108 (90 Base) MCG/ACT inhaler Inhale 2 puffs into the lungs every 4 (four) hours as needed for wheezing or shortness of breath. 04/07/16   Kandra Nicolas, MD  citalopram (CELEXA) 40 MG tablet Take 1 tablet (40 mg total) by mouth daily. 06/25/16   Mary-Margaret Hassell Done, FNP  dicyclomine (BENTYL) 20 MG tablet Take one every 6-8hours for abd cramps Patient taking differently: Take 20 mg by mouth as directed. Take one every 6-8hours for abd cramps 07/06/16   Milton Ferguson, MD  ondansetron (ZOFRAN) 4 MG tablet Take 1 tablet (4 mg total) by mouth every 8 (eight) hours as needed for nausea or vomiting. 07/04/16   Eustaquio Maize, MD  traMADol (ULTRAM) 50 MG tablet Take 1 tablet (50 mg total) by mouth every 6 (six) hours as needed. Patient not taking: Reported on 07/13/2016 07/06/16   Milton Ferguson, MD   Physical Exam: Vitals:   07/13/16 1046  BP: (!) 109/52  Pulse: (!) 57  Resp: 18  Temp: 98.4 F (36.9 C)  TempSrc: Oral  SpO2: 96%    Weight: 105.5 kg (232 lb 9.4 oz)  Height: 5\' 4"  (1.626 m)     General exam:  Well developed and nourished patient, lying supine appears in mild to moderate pain.  Head, eyes and ENT: Nontraumatic and normocephalic. Pupils equally reacting to light and accommodation. Oral mucosa moist.  Neck: Supple. No JVD, carotid bruit or thyromegaly.  Lymphatics: No lymphadenopathy.  Respiratory system: Clear to auscultation. No increased work of breathing.  Cardiovascular system: S1 and S2 heard, No JVD.  Gastrointestinal system: Abdomen is nondistended, soft but tenderness and guarding of LUQ and LLQ with light palpation, hypoactive bowel sounds noted. No organomegaly or masses appreciated.  Central nervous system: Alert and oriented. No focal neurological deficits.  Extremities: Symmetric 5 x 5 power. Peripheral pulses symmetrically felt.   Skin: No rashes or acute findings.  Musculoskeletal system: Negative exam.  Psychiatry: Pleasant and cooperative.  Labs on Admission:  Basic Metabolic Panel:  Recent  Labs Lab 07/06/16 1505 07/11/16 0636  NA 139 135  K 4.5 3.8  CL 105 102  CO2 25 24  GLUCOSE 100* 192*  BUN 18 18  CREATININE 1.05* 0.90  CALCIUM 9.6 9.5   Liver Function Tests:  Recent Labs Lab 07/06/16 1505 07/11/16 0636  AST 28 19  ALT 50 35  ALKPHOS 60 51  BILITOT 0.5 0.7  PROT 7.8 7.2  ALBUMIN 4.4 4.3    Recent Labs Lab 07/06/16 1505 07/11/16 0636  LIPASE 39 29   No results for input(s): AMMONIA in the last 168 hours. CBC:  Recent Labs Lab 07/06/16 1505 07/11/16 0636  WBC 10.4 10.5  NEUTROABS  --  8.0*  HGB 13.5 12.9  HCT 40.9 39.0  MCV 87.0 85.9  PLT 278 218   Cardiac Enzymes: No results for input(s): CKTOTAL, CKMB, CKMBINDEX, TROPONINI in the last 168 hours.  BNP (last 3 results) No results for input(s): PROBNP in the last 8760 hours. CBG: No results for input(s): GLUCAP in the last 168 hours.  Radiological Exams on Admission: Dg  Chest Port 1 View  Result Date: 07/13/2016 CLINICAL DATA:  Diverticulitis EXAM: PORTABLE CHEST 1 VIEW COMPARISON:  04/07/2016 FINDINGS: Lungs are clear.  No pleural effusion or pneumothorax. The heart is normal in size. IMPRESSION: No evidence of acute cardiopulmonary disease. Electronically Signed   By: Julian Hy M.D.   On: 07/13/2016 11:18   EKG: Independently reviewed. Sinus bradycardia  Assessment/Plan Principal Problem:   Acute diverticulitis Active Problems:   LLQ abdominal pain   POLYP, COLON   Depression   Asthma   Hyperlipidemia   Hypothyroidism  1. Acute Diverticultis - failed outpatient management with oral augmentin tablets, now patient reporting that abdominal pain is worsening and the pain has transition to the left upper quadrant and left lower quadrant. She now has guarding and rebound tenderness. I ordered a CT given to rule out worsening diverticulitis or perforation. Check blood cultures. Check chest x-ray. EKG ordered. CBC and CMP ordered. IV Zosyn and Flagyl ordered. Clear liquid diet ordered. Gen. surgery consultation requested. I also spoke with her primary care provider Dr. Wendi Snipes. 2. Diabetes mellitus, type II-will monitor blood glucose and provide supplemental sliding scale coverage as needed. 3. Hypertension-resume home medications and monitor. 4. Depression-resume home medications. She is taking Wellbutrin for this. 5. Hypothyroidism-stable. 6. Asthma-mild, stable will follow clinically.   DVT Prophylaxis: hep Code Status: full  Family Communication: husband  Disposition Plan: TBD   Time spent: 9 mins  Irwin Brakeman, MD [facility] Pager [phone]  If 7PM-7AM, please contact night-coverage [email or web address] Password [email or web address] 07/13/2016, 11:50 AM

## 2016-07-13 NOTE — Progress Notes (Signed)
CRITICAL VALUE ALERT  Critical value received:  Lactic acid 2.2  Date of notification:  07/13/16  Time of notification:  1300 Critical value read back:Yes.    Nurse who received alert:  Janace Aris, RN   MD notified (1st page):  Dr. Wynetta Emery   Time of first page:  [phone]

## 2016-07-13 NOTE — Progress Notes (Signed)
Pharmacy Antibiotic Note  [name] is a 56 y.o. female admitted on 07/13/2016 with intra abdominal infection.  Pharmacy has been consulted for zosyn dosing.  Plan: Zosyn 3.375g IV q8h (4 hour infusion).  Height: 5\' 4"  (162.6 cm) Weight: 232 lb 9.4 oz (105.5 kg) IBW/kg (Calculated) : 54.7  Temp (24hrs), Avg:97.7 F (36.5 C), Min:97 F (36.1 C), Max:98.4 F (36.9 C)   Recent Labs Lab 07/06/16 1505 07/11/16 0636  WBC 10.4 10.5  CREATININE 1.05* 0.90    Estimated Creatinine Clearance: 83.6 mL/min (by C-G formula based on SCr of 0.9 mg/dL).    Allergies  Allergen Reactions  . Aspirin Anaphylaxis  . Bee Venom Anaphylaxis  . Benadryl [Diphenhydramine Hcl] Anaphylaxis  . Morphine And Related Shortness Of Breath and Itching  . Vicks Formula 44 Cough-Cold Pm [Dm-Apap-Cpm] Shortness Of Breath and Rash    Not anaphylaxis  . Iohexol Rash    NEEDS PREMEDS, RASH ACROSS ABDOMEN AFTER INJECTION    Antimicrobials this admission: zosyn 2/2 >>  flagyl 2/2 >>    Thank you for allowing pharmacy to be a part of this patient's care.  Excell Seltzer [place] 07/13/2016 11:16 AM

## 2016-07-13 NOTE — Progress Notes (Signed)
   HPI  Patient presents today with persistent abdominal pain.  Patient complains of left upper and left lower quadrant abdominal pain that is not improving. She was seen in the emergency room on January 31 and diagnosed with mild diverticulitis. She was treated with Cipro and Flagyl, this was changed to Augmentin at the pharmacy due to interactions with her other medications.  She's been using a small amount of Percocet for pain. She states that she's only taken 1 or 2 pills per day to avoid constipation. She is using a stool softener.  She reports chills and sweats last night soaking her bed.  PMH: Smoking status noted ROS: Per HPI  Objective: BP 113/63   Pulse (!) 57   Temp 97 F (36.1 C) (Oral)   Ht 5\' 4"  (1.626 m)   Wt 228 lb 3.2 oz (103.5 kg)   BMI 39.17 kg/m  Gen: NAD, alert, cooperative with exam HEENT: NCAT CV: RRR, good S1/S2, no murmur Resp: CTABL, no wheezes, non-labored Abd: Soft with guarding on the left upper and lower quadrant, tenderness to palpation in left upper and left lower quadrant, positive bowel sounds Ext: No edema, warm Neuro: Alert and oriented, No gross deficits  Assessment and plan:  PLeasant 56 year old female with uncontrolled diabetes, hypertension, asthma, and hyperlipidemia. 2 recent emergency room visits for abdominal pain with a clear CT scan, then abdominal pain with mild diverticulitis.  # Diverticulitis Hemodynamically stable, no signs of sepsis Stable to worsening, not responding to adequate antibiotics at home, patient is currently taking Augmentin and on day 3 of treatment. Considering abd exam and failure of PO antibiotics [facility] has accepted her for direct [place], MD [place] 07/13/2016, 9:36 AM

## 2016-07-14 LAB — COMPREHENSIVE METABOLIC PANEL
ALBUMIN: 3.5 g/dL (ref 3.5–5.0)
ALK PHOS: 46 U/L (ref 38–126)
ALT: 21 U/L (ref 14–54)
AST: 14 U/L — AB (ref 15–41)
Anion gap: 7 (ref 5–15)
BUN: 9 mg/dL (ref 6–20)
CALCIUM: 9 mg/dL (ref 8.9–10.3)
CHLORIDE: 106 mmol/L (ref 101–111)
CO2: 23 mmol/L (ref 22–32)
CREATININE: 0.78 mg/dL (ref 0.44–1.00)
GFR calc non Af Amer: >60 mL/min (ref >60)
GLUCOSE: 132 mg/dL — AB (ref 65–99)
Potassium: 4 mmol/L (ref 3.5–5.1)
SODIUM: 136 mmol/L (ref 135–145)
Total Bilirubin: 0.5 mg/dL (ref 0.3–1.2)
Total Protein: 6.6 g/dL (ref 6.5–8.1)

## 2016-07-14 LAB — GLUCOSE, CAPILLARY
GLUCOSE-CAPILLARY: 116 mg/dL — AB (ref 65–99)
GLUCOSE-CAPILLARY: 242 mg/dL — AB (ref 65–99)
Glucose-Capillary: 188 mg/dL — ABNORMAL HIGH (ref 65–99)
Glucose-Capillary: 100 mg/dL — ABNORMAL HIGH (ref 65–99)

## 2016-07-14 LAB — CBC WITH DIFFERENTIAL/PLATELET
BASOS PCT: 0 %
Basophils Absolute: 0 10*3/uL (ref 0.0–0.1)
EOS ABS: 0.1 10*3/uL (ref 0.0–0.7)
Eosinophils Relative: 1 %
HEMATOCRIT: 33.8 % — AB (ref 36.0–46.0)
HEMOGLOBIN: 11.2 g/dL — AB (ref 12.0–15.0)
LYMPHS ABS: 2.4 10*3/uL (ref 0.7–4.0)
Lymphocytes Relative: 24 %
MCH: 28.9 pg (ref 26.0–34.0)
MCHC: 33.1 g/dL (ref 30.0–36.0)
MCV: 87.1 fL (ref 78.0–100.0)
MONO ABS: 0.6 10*3/uL (ref 0.1–1.0)
Monocytes Relative: 6 %
NEUTROS PCT: 69 %
Neutro Abs: 7.1 10*3/uL (ref 1.7–7.7)
Platelets: 219 10*3/uL (ref 150–400)
RBC: 3.88 MIL/uL (ref 3.87–5.11)
RDW: 13.4 % (ref 11.5–15.5)
WBC: 10.3 10*3/uL (ref 4.0–10.5)

## 2016-07-14 MED ORDER — POLYETHYLENE GLYCOL 3350 17 G PO PACK
17.0000 g | PACK | Freq: Every day | ORAL | Status: DC
  Filled 2016-07-14 (×2): qty 1

## 2016-07-14 NOTE — Progress Notes (Signed)
CRITICAL VALUE ALERT  Critical value received:  Blood cultures positive gram negative rodson the anaerobic bottle  Date of notification:  07/14/16  Time of notification: [id number]  Critical value read back:Yes.    Nurse who received alert:  Janace Aris, RN   MD notified (1st page): Dr. Wynetta Emery   Time of first page:  [phone]  No new orders

## 2016-07-14 NOTE — Progress Notes (Signed)
PROGRESS NOTE    [name]  [id number]  DOB: 03-08-1961  DOA: 07/13/2016 PCP: Chevis Pretty, FNP Outpatient Specialists:   Hospital course: [name] is a 56 y.o. female who has been suffering with abdominal pain for the past 3 weeks. She's had 2 recent ED visits for evaluation of abdominal pain and diagnosed with mild acute diverticulitis.  Assessment & Plan:   1. Acute Diverticultis - failed outpatient management with oral augmentin tablets, now patient reporting that abdominal pain is worsening and the pain has transition to the left upper quadrant and left lower quadrant. CT abd with persistent diverticulitis but no perforation seen. Blood cultures NGTD.  EKG ordered. CBC and CMP ordered. IV Zosyn and Flagyl ordered. Clear liquid diet tolerated, will advance diet today. Gen. surgery consultation requested.  2. Diabetes mellitus, type 2-will monitor blood glucose and provide supplemental sliding scale coverage as needed. 3. Hypertension-resume home medications and monitor. 4. Depression-resume home medications. She is taking Wellbutrin for this. 5. Hypothyroidism-stable. 6. Asthma-mild, stable will follow clinically.   DVT Prophylaxis: hep Code Status: full  Family Communication: husband  Disposition Plan: TBD   Consultants:  gen surgery  Subjective: Pt reports pain not allowing her to sleep at night, had BM, reporting that she wants to advance diet some today.  No n/v.   Objective: Vitals:   07/13/16 1046 07/13/16 1535 07/14/16 0007 07/14/16 0700  BP: (!) 109/52 (!) 109/45 (!) 129/51 (!) 110/50  Pulse: (!) 57 (!) 59 60 (!) 53  Resp: 18  18 18   Temp: 98.4 F (36.9 C) 98 F (36.7 C) 98 F (36.7 C) 97.9 F (36.6 C)  TempSrc: Oral Oral Oral Oral  SpO2: 96% 97% 97% 100%  Weight: 105.5 kg (232 lb 9.4 oz)     Height: 5\' 4"  (1.626 m)       Intake/Output Summary (Last 24 hours) at 07/14/16 0915 Last data filed at 07/13/16 1500  Gross  per 24 hour  Intake             2050 ml  Output                0 ml  Net             2050 ml   Filed Weights   07/13/16 1046  Weight: 105.5 kg (232 lb 9.4 oz)    Exam:  General exam: awake, alert, NAD, cooperative.  Respiratory system: Clear. No increased work of breathing. Cardiovascular system: S1 & S2 heard, RRR. No JVD, murmurs, gallops, clicks or pedal edema. Gastrointestinal system: Abdomen is nondistended, soft with LUQ and LLQ tenderness, some guarding. Normal bowel sounds heard. Central nervous system: Alert and oriented. No focal neurological deficits. Extremities: no cyanosis, TED hoses bilateral LE.  Data Reviewed: Basic Metabolic Panel:  Recent Labs Lab 07/11/16 0636 07/13/16 1134 07/14/16 0607  NA 135 135 136  K 3.8 3.8 4.0  CL 102 105 106  CO2 24 22 23   GLUCOSE 192* 62* 132*  BUN 18 10 9   CREATININE 0.90 0.89 0.78  CALCIUM 9.5 9.3 9.0   Liver Function Tests:  Recent Labs Lab 07/11/16 0636 07/13/16 1134 07/14/16 0607  AST 19 18 14*  ALT 35 23 21  ALKPHOS 51 51 46  BILITOT 0.7 0.6 0.5  PROT 7.2 6.8 6.6  ALBUMIN 4.3 3.7 3.5    Recent Labs Lab 07/11/16 0636  LIPASE 29   No results for input(s): AMMONIA in the last 168 hours. CBC:  Recent Labs Lab 07/11/16 0636 07/13/16 1134 07/14/16 0607  WBC 10.5 9.0 10.3  NEUTROABS 8.0* 5.5 7.1  HGB 12.9 11.6* 11.2*  HCT 39.0 35.8* 33.8*  MCV 85.9 87.5 87.1  PLT 218 215 219   Cardiac Enzymes: No results for input(s): CKTOTAL, CKMB, CKMBINDEX, TROPONINI in the last 168 hours. CBG (last 3)   Recent Labs  07/13/16 1250 07/13/16 1630 07/14/16 0726  GLUCAP 74 199* 116*   Recent Results (from the past 240 hour(s))  C difficile quick scan w PCR reflex     Status: None   Collection Time: 07/11/16  8:50 AM  Result Value Ref Range Status   C Diff antigen NEGATIVE NEGATIVE Final   C Diff toxin NEGATIVE NEGATIVE Final   C Diff interpretation No C. difficile detected.  Final  Culture, blood  (Routine X 2) w Reflex to ID Panel     Status: None (Preliminary result)   Collection Time: 07/13/16 11:34 AM  Result Value Ref Range Status   Specimen Description RIGHT ANTECUBITAL  Final   Special Requests BOTTLES DRAWN AEROBIC AND ANAEROBIC [facility] EACH  Final   Culture PENDING  Incomplete   Report Status PENDING  Incomplete  Culture, blood (Routine X 2) w Reflex to ID Panel     Status: None (Preliminary result)   Collection Time: 07/13/16 11:50 AM  Result Value Ref Range Status   Specimen Description BLOOD RIGHT HAND  Final   Special Requests BOTTLES DRAWN AEROBIC AND ANAEROBIC 4CC EACH  Final   Culture PENDING  Incomplete   Report Status PENDING  Incomplete     Studies: Ct Abdomen Pelvis W Contrast  Result Date: 07/13/2016 CLINICAL DATA:  Left-sided pain.  History of diverticulitis. EXAM: CT ABDOMEN AND PELVIS WITH CONTRAST TECHNIQUE: Multidetector CT imaging of the abdomen and pelvis was performed using the standard protocol following bolus administration of intravenous contrast. CONTRAST:  1 ISOVUE-300 IOPAMIDOL (ISOVUE-300) INJECTION 61%, 133mL ISOVUE-300 IOPAMIDOL (ISOVUE-300) INJECTION 61% COMPARISON:  None. FINDINGS: Lower chest: Subsegmental atelectasis noted within the lung bases. Hepatobiliary: No focal liver abnormality is seen. No gallstones, gallbladder wall thickening, or biliary dilatation. Pancreas: Unremarkable. No pancreatic ductal dilatation or surrounding inflammatory changes. Spleen: Normal in size without focal abnormality. Adrenals/Urinary Tract: The adrenal glands are normal. The right kidney appears normal. Small left renal calculus measures 3 mm, image 39 of series 2. No hydronephrosis or hydroureter. Urinary bladder appears normal. Stomach/Bowel: The stomach appears normal. The small bowel loops have a normal course and caliber. No obstruction. No pathologic dilatation of the colon. Numerous distal colonic diverticula identified. Focal area of inflammation involving the  mid descending colon is identified compatible with uncomplicated acute diverticulitis, image 50 of series 2. Vascular/Lymphatic: Aortic atherosclerosis. No aneurysm. No abdominal or pelvic adenopathy. Reproductive: Status post hysterectomy. No adnexal masses. Other: Previous ventral abdominal wall hernia repair. No free fluid or fluid collections identified. Musculoskeletal: No aggressive lytic or sclerotic bone lesions. IMPRESSION: 1. Uncomplicated acute diverticulitis involves the descending colon. No significant free air or focal fluid collections identified. 2. Nonobstructing left renal calculus 3. Aortic atherosclerosis Electronically Signed   By: Kerby Moors M.D.   On: 07/13/2016 16:06   Dg Chest Port 1 View  Result Date: 07/13/2016 CLINICAL DATA:  Diverticulitis EXAM: PORTABLE CHEST 1 VIEW COMPARISON:  04/07/2016 FINDINGS: Lungs are clear.  No pleural effusion or pneumothorax. The heart is normal in size. IMPRESSION: No evidence of acute cardiopulmonary disease. Electronically Signed   By: Henderson Newcomer.D.  On: 07/13/2016 11:18     Scheduled Meds: . atorvastatin  40 mg Oral Daily  . benazepril  20 mg Oral Daily  . buPROPion  450 mg Oral Daily  . fenofibrate  160 mg Oral Daily  . heparin  5,000 Units Subcutaneous Q8H  . insulin aspart  0-15 Units Subcutaneous TID WC  . metroNIDAZOLE  500 mg Oral Q8H  . piperacillin-tazobactam (ZOSYN)  IV  3.375 g Intravenous Q8H  . propranolol  40 mg Oral BID  . sodium chloride flush  3 mL Intravenous Q12H  . topiramate  50 mg Oral BID   Continuous Infusions:  Principal Problem:   Acute diverticulitis Active Problems:   LLQ abdominal pain   POLYP, COLON   Depression   Asthma   Hyperlipidemia   Hypothyroidism  Time spent:   Irwin Brakeman, MD, FAAFP [facility] Pager [phone]  If 7PM-7AM, please contact night-coverage [email or web address] Password [email or web address] 07/14/2016, 9:15 AM    LOS: 1 day

## 2016-07-15 LAB — BLOOD CULTURE ID PANEL (REFLEXED)
ACINETOBACTER BAUMANNII: NOT DETECTED
CANDIDA ALBICANS: NOT DETECTED
CANDIDA GLABRATA: NOT DETECTED
Candida krusei: NOT DETECTED
Candida parapsilosis: NOT DETECTED
Candida tropicalis: NOT DETECTED
ENTEROBACTER CLOACAE COMPLEX: NOT DETECTED
ENTEROBACTERIACEAE SPECIES: NOT DETECTED
ENTEROCOCCUS SPECIES: NOT DETECTED
Escherichia coli: NOT DETECTED
HAEMOPHILUS INFLUENZAE: NOT DETECTED
Klebsiella oxytoca: NOT DETECTED
Klebsiella pneumoniae: NOT DETECTED
LISTERIA MONOCYTOGENES: NOT DETECTED
NEISSERIA MENINGITIDIS: NOT DETECTED
Proteus species: NOT DETECTED
Pseudomonas aeruginosa: NOT DETECTED
STREPTOCOCCUS AGALACTIAE: NOT DETECTED
STREPTOCOCCUS PNEUMONIAE: NOT DETECTED
STREPTOCOCCUS PYOGENES: NOT DETECTED
STREPTOCOCCUS SPECIES: NOT DETECTED
Serratia marcescens: NOT DETECTED
Staphylococcus aureus (BCID): NOT DETECTED
Staphylococcus species: NOT DETECTED

## 2016-07-15 LAB — CBC WITH DIFFERENTIAL/PLATELET
BASOS ABS: 0 10*3/uL (ref 0.0–0.1)
BASOS PCT: 0 %
EOS ABS: 0.2 10*3/uL (ref 0.0–0.7)
Eosinophils Relative: 3 %
HEMATOCRIT: 34.8 % — AB (ref 36.0–46.0)
HEMOGLOBIN: 11.5 g/dL — AB (ref 12.0–15.0)
Lymphocytes Relative: 47 %
Lymphs Abs: 3.6 10*3/uL (ref 0.7–4.0)
MCH: 28.9 pg (ref 26.0–34.0)
MCHC: 33 g/dL (ref 30.0–36.0)
MCV: 87.4 fL (ref 78.0–100.0)
MONO ABS: 0.5 10*3/uL (ref 0.1–1.0)
MONOS PCT: 7 %
NEUTROS ABS: 3.4 10*3/uL (ref 1.7–7.7)
NEUTROS PCT: 43 %
Platelets: 222 10*3/uL (ref 150–400)
RBC: 3.98 MIL/uL (ref 3.87–5.11)
RDW: 13.5 % (ref 11.5–15.5)
WBC: 7.8 10*3/uL (ref 4.0–10.5)

## 2016-07-15 LAB — COMPREHENSIVE METABOLIC PANEL
ALK PHOS: 45 U/L (ref 38–126)
ALT: 30 U/L (ref 14–54)
ANION GAP: 10 (ref 5–15)
AST: 22 U/L (ref 15–41)
Albumin: 3.6 g/dL (ref 3.5–5.0)
BILIRUBIN TOTAL: 0.6 mg/dL (ref 0.3–1.2)
BUN: 12 mg/dL (ref 6–20)
CALCIUM: 9.4 mg/dL (ref 8.9–10.3)
CO2: 23 mmol/L (ref 22–32)
CREATININE: 0.95 mg/dL (ref 0.44–1.00)
Chloride: 105 mmol/L (ref 101–111)
Glucose, Bld: 137 mg/dL — ABNORMAL HIGH (ref 65–99)
Potassium: 3.7 mmol/L (ref 3.5–5.1)
Sodium: 138 mmol/L (ref 135–145)
Total Protein: 6.5 g/dL (ref 6.5–8.1)

## 2016-07-15 LAB — GLUCOSE, CAPILLARY
GLUCOSE-CAPILLARY: 137 mg/dL — AB (ref 65–99)
Glucose-Capillary: 170 mg/dL — ABNORMAL HIGH (ref 65–99)
Glucose-Capillary: 146 mg/dL — ABNORMAL HIGH (ref 65–99)
Glucose-Capillary: 187 mg/dL — ABNORMAL HIGH (ref 65–99)

## 2016-07-15 LAB — HEMOGLOBIN A1C
Hgb A1c MFr Bld: 7.4 % — ABNORMAL HIGH (ref 4.8–5.6)
Mean Plasma Glucose: 166 mg/dL

## 2016-07-15 LAB — URINE CULTURE: Culture: NO GROWTH

## 2016-07-15 MED ORDER — HYDROMORPHONE HCL 1 MG/ML IJ SOLN
0.5000 mg | INTRAMUSCULAR | Status: DC | PRN
  Administered 2016-07-15 – 2016-07-16 (×5): 0.5 mg via INTRAVENOUS
  Filled 2016-07-15 (×6): qty 1

## 2016-07-15 NOTE — Progress Notes (Signed)
PROGRESS NOTE    [name]  [id number]  DOB: [birth year]  DOA: 07/13/2016 PCP: Chevis Pretty, FNP Outpatient Specialists:   Hospital course: [name] is a 56 y.o. female who has been suffering with abdominal pain for the past 3 weeks. She's had 2 recent ED visits for evaluation of abdominal pain and diagnosed with mild acute diverticulitis.  Assessment & Plan:   1. Acute Diverticultis - failed outpatient management with oral augmentin tablets, now patient reporting that abdominal pain is worsening and the pain has transition to the left upper quadrant and left lower quadrant. CT abd with persistent diverticulitis but no perforation seen. Blood cultures 1/2 positive for gram-negative rods in the anaerobic bottle.   Clinically patient is reporting improvement today. Less use of pain medication. Continue IV Zosyn and Flagyl. Advance diet as tolerated.  Encourage ambulation today. 2. Diabetes mellitus, type 2-will monitor blood glucose and provide supplemental sliding scale coverage as needed. Fairly well controlled. 3. Hypertension-resume home medications and monitor. 4. Depression-resume home medications. She is taking Wellbutrin for this. 5. Hypothyroidism-stable. 6. Asthma-mild, stable will follow clinically.   DVT Prophylaxis: hep Code Status: full  Family Communication: husband  Disposition Plan:  possible discharge 2/5    Consultants:  gen surgery  Subjective: Pt reports pain is improving. Patient tolerating diet.  Objective: Vitals:   07/14/16 0700 07/14/16 1322 07/14/16 2024 07/15/16 0500  BP: (!) 110/50 (!) 115/50 (!) 114/53 (!) 126/55  Pulse: (!) 53 (!) 52 60 65  Resp: 18 18 18 18   Temp: 97.9 F (36.6 C) 97.9 F (36.6 C) 97.5 F (36.4 C) 97.6 F (36.4 C)  TempSrc: Oral Oral Oral Oral  SpO2: 100% 99% 97% 98%  Weight:      Height:        Intake/Output Summary (Last 24 hours) at 07/15/16 1052 Last data filed at 07/14/16 1800  Gross per 24 hour  Intake              480 ml  Output                0 ml  Net              480 ml   Filed Weights   07/13/16 1046  Weight: 105.5 kg (232 lb 9.4 oz)    Exam:  General exam: awake, alert, NAD, cooperative.  Respiratory system: Clear. No increased work of breathing. Cardiovascular system: S1 & S2 heard, RRR. No JVD, murmurs, gallops, clicks or pedal edema. Gastrointestinal system: Abdomen is nondistended, soft with LUQ and LLQ tenderness, No guarding no rebound tenderness. Normal bowel sounds heard. Central nervous system: Alert and oriented. No focal neurological deficits. Extremities: no cyanosis, TED hoses bilateral LE.  Data Reviewed: Basic Metabolic Panel:  Recent Labs Lab 07/11/16 0636 07/13/16 1134 07/14/16 0607 07/15/16 0515  NA 135 135 136 138  K 3.8 3.8 4.0 3.7  CL 102 105 106 105  CO2 24 22 23 23   GLUCOSE 192* 62* 132* 137*  BUN 18 10 9 12   CREATININE 0.90 0.89 0.78 0.95  CALCIUM 9.5 9.3 9.0 9.4   Liver Function Tests:  Recent Labs Lab 07/11/16 0636 07/13/16 1134 07/14/16 0607 07/15/16 0515  AST 19 18 14* 22  ALT 35 23 21 30   ALKPHOS 51 51 46 45  BILITOT 0.7 0.6 0.5 0.6  PROT 7.2 6.8 6.6 6.5  ALBUMIN 4.3 3.7 3.5 3.6    Recent Labs Lab 07/11/16 0636  LIPASE 29  No results for input(s): AMMONIA in the last 168 hours. CBC:  Recent Labs Lab 07/11/16 0636 07/13/16 1134 07/14/16 0607 07/15/16 0515  WBC 10.5 9.0 10.3 7.8  NEUTROABS 8.0* 5.5 7.1 3.4  HGB 12.9 11.6* 11.2* 11.5*  HCT 39.0 35.8* 33.8* 34.8*  MCV 85.9 87.5 87.1 87.4  PLT 218 215 219 222   Cardiac Enzymes: No results for input(s): CKTOTAL, CKMB, CKMBINDEX, TROPONINI in the last 168 hours. CBG (last 3)   Recent Labs  07/14/16 1619 07/14/16 2022 07/15/16 0739  GLUCAP 100* 242* 137*   Recent Results (from the past 240 hour(s))  C difficile quick scan w PCR reflex     Status: None   Collection Time: 07/11/16  8:50 AM  Result Value Ref Range Status   C  Diff antigen NEGATIVE NEGATIVE Final   C Diff toxin NEGATIVE NEGATIVE Final   C Diff interpretation No C. difficile detected.  Final  Culture, blood (Routine X 2) w Reflex to ID Panel     Status: None (Preliminary result)   Collection Time: 07/13/16 11:34 AM  Result Value Ref Range Status   Specimen Description RIGHT ANTECUBITAL  Final   Special Requests BOTTLES DRAWN AEROBIC AND ANAEROBIC 6CC EACH  Final   Culture NO GROWTH 1 DAY  Final   Report Status PENDING  Incomplete  Culture, blood (Routine X 2) w Reflex to ID Panel     Status: None (Preliminary result)   Collection Time: 07/13/16 11:50 AM  Result Value Ref Range Status   Specimen Description BLOOD RIGHT HAND  Final   Special Requests BOTTLES DRAWN AEROBIC AND ANAEROBIC 4CC EACH  Final   Culture  Setup Time   Final    GRAM NEGATIVE RODS Gram Stain Report Called to,Read Back By and Verified With: KNIGHT,C AT [id number] ON 07/14/2016 BY EVA ANAEROBIC BOTTLE ONLY Performed at [place], READ BACK BY AND VERIFIED WITH: L SEAY,PHARMD AT 0737 07/15/16 BY L BENFIELD Performed at [facility], [place] [address] [place], [place] [address]    Culture GRAM NEGATIVE RODS  Final   Report Status PENDING  Incomplete  Blood Culture ID Panel (Reflexed)     Status: None   Collection Time: 07/13/16 11:50 AM  Result Value Ref Range Status   Enterococcus species NOT DETECTED NOT DETECTED Final   Listeria monocytogenes NOT DETECTED NOT DETECTED Final   Staphylococcus species NOT DETECTED NOT DETECTED Final   Staphylococcus aureus NOT DETECTED NOT DETECTED Final   Streptococcus species NOT DETECTED NOT DETECTED Final   Streptococcus agalactiae NOT DETECTED NOT DETECTED Final   Streptococcus pneumoniae NOT DETECTED NOT DETECTED Final   Streptococcus pyogenes NOT DETECTED NOT DETECTED Final   Acinetobacter baumannii NOT DETECTED NOT DETECTED Final   Enterobacteriaceae species NOT DETECTED NOT DETECTED Final    Enterobacter cloacae complex NOT DETECTED NOT DETECTED Final   Escherichia coli NOT DETECTED NOT DETECTED Final   Klebsiella oxytoca NOT DETECTED NOT DETECTED Final   Klebsiella pneumoniae NOT DETECTED NOT DETECTED Final   Proteus species NOT DETECTED NOT DETECTED Final   Serratia marcescens NOT DETECTED NOT DETECTED Final   Haemophilus influenzae NOT DETECTED NOT DETECTED Final   Neisseria meningitidis NOT DETECTED NOT DETECTED Final   Pseudomonas aeruginosa NOT DETECTED NOT DETECTED Final   Candida albicans NOT DETECTED NOT DETECTED Final   Candida glabrata NOT DETECTED NOT DETECTED Final   Candida krusei NOT DETECTED NOT DETECTED Final   Candida parapsilosis NOT  DETECTED NOT DETECTED Final   Candida tropicalis NOT DETECTED NOT DETECTED Final    Comment: Performed at [facility], [place] [address], [place], [place] [address]     Studies: Ct Abdomen Pelvis W Contrast  Result Date: 07/13/2016 CLINICAL DATA:  Left-sided pain.  History of diverticulitis. EXAM: CT ABDOMEN AND PELVIS WITH CONTRAST TECHNIQUE: Multidetector CT imaging of the abdomen and pelvis was performed using the standard protocol following bolus administration of intravenous contrast. CONTRAST:  1 ISOVUE-300 IOPAMIDOL (ISOVUE-300) INJECTION 61%, 167mL ISOVUE-300 IOPAMIDOL (ISOVUE-300) INJECTION 61% COMPARISON:  None. FINDINGS: Lower chest: Subsegmental atelectasis noted within the lung bases. Hepatobiliary: No focal liver abnormality is seen. No gallstones, gallbladder wall thickening, or biliary dilatation. Pancreas: Unremarkable. No pancreatic ductal dilatation or surrounding inflammatory changes. Spleen: Normal in size without focal abnormality. Adrenals/Urinary Tract: The adrenal glands are normal. The right kidney appears normal. Small left renal calculus measures 3 mm, image 39 of series 2. No hydronephrosis or hydroureter. Urinary bladder appears normal. Stomach/Bowel: The stomach appears normal. The small bowel loops  have a normal course and caliber. No obstruction. No pathologic dilatation of the colon. Numerous distal colonic diverticula identified. Focal area of inflammation involving the mid descending colon is identified compatible with uncomplicated acute diverticulitis, image 50 of series 2. Vascular/Lymphatic: Aortic atherosclerosis. No aneurysm. No abdominal or pelvic adenopathy. Reproductive: Status post hysterectomy. No adnexal masses. Other: Previous ventral abdominal wall hernia repair. No free fluid or fluid collections identified. Musculoskeletal: No aggressive lytic or sclerotic bone lesions. IMPRESSION: 1. Uncomplicated acute diverticulitis involves the descending colon. No significant free air or focal fluid collections identified. 2. Nonobstructing left renal calculus 3. Aortic atherosclerosis Electronically Signed   By: Kerby Moors M.D.   On: 07/13/2016 16:06   Dg Chest Port 1 View  Result Date: 07/13/2016 CLINICAL DATA:  Diverticulitis EXAM: PORTABLE CHEST 1 VIEW COMPARISON:  04/07/2016 FINDINGS: Lungs are clear.  No pleural effusion or pneumothorax. The heart is normal in size. IMPRESSION: No evidence of acute cardiopulmonary disease. Electronically Signed   By: Julian Hy M.D.   On: 07/13/2016 11:18   Scheduled Meds: . atorvastatin  40 mg Oral Daily  . benazepril  20 mg Oral Daily  . buPROPion  450 mg Oral Daily  . fenofibrate  160 mg Oral Daily  . heparin  5,000 Units Subcutaneous Q8H  . insulin aspart  0-15 Units Subcutaneous TID WC  . metroNIDAZOLE  500 mg Oral Q8H  . piperacillin-tazobactam (ZOSYN)  IV  3.375 g Intravenous Q8H  . polyethylene glycol  17 g Oral Daily  . propranolol  40 mg Oral BID  . sodium chloride flush  3 mL Intravenous Q12H  . topiramate  50 mg Oral BID   Continuous Infusions:  Principal Problem:   Acute diverticulitis Active Problems:   LLQ abdominal pain   POLYP, COLON   Depression   Asthma   Hyperlipidemia   Hypothyroidism  Time spent:    Irwin Brakeman, MD, FAAFP [facility] Pager [phone]  If 7PM-7AM, please contact night-coverage [email or web address] Password [email or web address] 07/15/2016, 10:52 AM    LOS: 2 days

## 2016-07-16 LAB — CBC WITH DIFFERENTIAL/PLATELET
BASOS ABS: 0 10*3/uL (ref 0.0–0.1)
Basophils Relative: 1 %
EOS PCT: 2 %
Eosinophils Absolute: 0.1 10*3/uL (ref 0.0–0.7)
HEMATOCRIT: 36.2 % (ref 36.0–46.0)
HEMOGLOBIN: 11.9 g/dL — AB (ref 12.0–15.0)
LYMPHS ABS: 2.2 10*3/uL (ref 0.7–4.0)
LYMPHS PCT: 41 %
MCH: 28.7 pg (ref 26.0–34.0)
MCHC: 32.9 g/dL (ref 30.0–36.0)
MCV: 87.2 fL (ref 78.0–100.0)
Monocytes Absolute: 0.5 10*3/uL (ref 0.1–1.0)
Monocytes Relative: 9 %
NEUTROS ABS: 2.5 10*3/uL (ref 1.7–7.7)
NEUTROS PCT: 47 %
PLATELETS: 184 10*3/uL (ref 150–400)
RBC: 4.15 MIL/uL (ref 3.87–5.11)
RDW: 13.5 % (ref 11.5–15.5)
WBC: 5.3 10*3/uL (ref 4.0–10.5)

## 2016-07-16 LAB — COMPREHENSIVE METABOLIC PANEL
ALT: 29 U/L (ref 14–54)
ANION GAP: 7 (ref 5–15)
AST: 17 U/L (ref 15–41)
Albumin: 3.4 g/dL — ABNORMAL LOW (ref 3.5–5.0)
Alkaline Phosphatase: 41 U/L (ref 38–126)
BILIRUBIN TOTAL: 0.4 mg/dL (ref 0.3–1.2)
BUN: 11 mg/dL (ref 6–20)
CHLORIDE: 107 mmol/L (ref 101–111)
CO2: 24 mmol/L (ref 22–32)
Calcium: 9.2 mg/dL (ref 8.9–10.3)
Creatinine, Ser: 1.02 mg/dL — ABNORMAL HIGH (ref 0.44–1.00)
Glucose, Bld: 164 mg/dL — ABNORMAL HIGH (ref 65–99)
POTASSIUM: 3.8 mmol/L (ref 3.5–5.1)
Sodium: 138 mmol/L (ref 135–145)
TOTAL PROTEIN: 6.2 g/dL — AB (ref 6.5–8.1)

## 2016-07-16 LAB — GLUCOSE, CAPILLARY
GLUCOSE-CAPILLARY: 159 mg/dL — AB (ref 65–99)
Glucose-Capillary: 172 mg/dL — ABNORMAL HIGH (ref 65–99)

## 2016-07-16 MED ORDER — SULFAMETHOXAZOLE-TRIMETHOPRIM 800-160 MG PO TABS
1.0000 | ORAL_TABLET | Freq: Two times a day (BID) | ORAL | 0 refills | Status: DC
Start: 2016-07-16 — End: 2016-07-16

## 2016-07-16 MED ORDER — METRONIDAZOLE 500 MG PO TABS: 500.0000 mg | ORAL_TABLET | Freq: Three times a day (TID) | ORAL | 0 refills | Status: DC

## 2016-07-16 MED ORDER — SULFAMETHOXAZOLE-TRIMETHOPRIM 800-160 MG PO TABS: 1.0000 | ORAL_TABLET | Freq: Two times a day (BID) | ORAL | 0 refills | Status: AC

## 2016-07-16 MED ORDER — METRONIDAZOLE 500 MG PO TABS: 500.0000 mg | ORAL_TABLET | Freq: Three times a day (TID) | ORAL | 0 refills | Status: AC

## 2016-07-16 NOTE — Progress Notes (Signed)
Discharge instructions reviewed with patient. Patient verbalized understanding. 

## 2016-07-16 NOTE — Discharge Instructions (Signed)
Diverticulitis °Diverticulitis is when small pockets that have formed in your colon (large intestine) become infected or swollen. °Follow these instructions at home: °· Follow your doctor's instructions. °· Follow a special diet if told by your doctor. °· When you feel better, your doctor may tell you to change your diet. You may be told to eat a lot of fiber. Fruits and vegetables are good sources of fiber. Fiber makes it easier to poop (have bowel movements). °· Take supplements or probiotics as told by your doctor. °· Only take medicines as told by your doctor. °· Keep all follow-up visits with your doctor. °Contact a doctor if: °· Your pain does not get better. °· You have a hard time eating food. °· You are not pooping like normal. °Get help right away if: °· Your pain gets worse. °· Your problems do not get better. °· Your problems suddenly get worse. °· You have a fever. °· You keep throwing up (vomiting). °· You have bloody or black, tarry poop (stool). °This information is not intended to replace advice given to you by your health care provider. Make sure you discuss any questions you have with your health care provider. °Document Released: 11/14/2007 Document Revised: 11/03/2015 Document Reviewed: 04/22/2013 °[employer]vier Interactive Patient Education © 2017 [employer] ° °

## 2016-07-16 NOTE — Discharge Summary (Signed)
Physician Discharge Summary  CONA DETORO [id number] DOB: 1961/05/16 DOA: 07/13/2016  PCP: Chevis Pretty, FNP  Admit date: 07/13/2016 Discharge date: 07/16/2016  Admitted From:  HOME  Disposition: Home   Recommendations for Outpatient Follow-up:  1. Follow up with PCP in 1 weeks 2. Please obtain BMP/CBC in one week 3. Please follow up on the following pending results: final blood culture results  Discharge Condition: STABLE  CODE STATUS: FULL   Brief/Interim Summary: HPI: [name] is a 56 y.o. female who has been suffering with abdominal pain for the past 3 weeks. She's had 2 recent ED visits for evaluation of abdominal pain and diagnosed with mild acute diverticulitis. She had been started on Cipro and Flagyl that was subsequently switched to Augmentin by pharmacy due to interactions with her medications but continues to have worsening abdominal pain and now the pain has relocated to the left upper quadrant and left lower quadrant. The patient had some nausea proximally one week ago but recently has been tolerating clears at home. She denies fever and chills. She denies vomiting. She denies diarrhea. She did have diarrhea several days ago and was tested for C. difficile and it was negative. The patient followed up with her primary care physician Dr. Wendi Snipes earlier this morning and was complaining of worsening symptoms not improving with the oral Augmentin therapy. He called and arrange for a direct admission to the hospital for further evaluation and management. The patient reports that she has been taking her medications as prescribed. She does report that she has been recently constipated in the past 24-48 hours. She reports no significant straining. She denies cough, fever, vision change and rash. She is being admitted for left lower quadrant abdominal pain, diverticulitis that failed outpatient therapy.  1. Acute Diverticultis - failed outpatient management with  oral augmentin tablets, much improved now, eating and drinking well, pain improved. CT abd with persistent diverticulitis but no perforation seen. Blood cultures 1/2 positive for gram-negative rods in the anaerobic bottle: final culture results pending at time of discharge. Clinically patient is reporting improvement today and wanting to go home.  DC on 10 days bactrim DS and Flagyl with close outpatient follow up.  2. Diabetes mellitus, type 2-resume home meds. Fairly well controlled. 3. Hypertension-resume home medications and monitor. 4. Depression-resume home medications. She is taking Wellbutrin for this. 5. Hypothyroidism-stable. 6. Asthma-mild, stable will follow clinically.  DVT Prophylaxis:hep Code Status:full Family Communication:husband Disposition Plan: Home  2/5   Discharge Diagnoses:  Principal Problem:   Acute diverticulitis Active Problems:   LLQ abdominal pain   POLYP, COLON   Depression   Asthma   Hyperlipidemia   Hypothyroidism  Discharge Instructions  Discharge Instructions    Increase activity slowly    Complete by:  As directed      Allergies as of 07/16/2016      Reactions   Aspirin Anaphylaxis   Bee Venom Anaphylaxis   Benadryl [diphenhydramine Hcl] Anaphylaxis   Morphine And Related Shortness Of Breath, Itching   Vicks Formula 44 Cough-cold Pm [dm-apap-cpm] Shortness Of Breath, Rash   Not anaphylaxis      Medication List    STOP taking these medications   amoxicillin-clavulanate 875-125 MG tablet Commonly known as:  AUGMENTIN   ciprofloxacin 500 MG tablet Commonly known as:  CIPRO   traMADol 50 MG tablet Commonly known as:  ULTRAM     TAKE these medications   acetaminophen 500 MG tablet Commonly known as:  TYLENOL Take  500 mg by mouth every 6 (six) hours as needed for mild pain or moderate pain.   albuterol 108 (90 Base) MCG/ACT inhaler Commonly known as:  PROVENTIL HFA;VENTOLIN HFA Inhale 2 puffs into the lungs every 4 (four)  hours as needed for wheezing or shortness of breath.   atorvastatin 40 MG tablet Commonly known as:  LIPITOR Take 1 tablet (40 mg total) by mouth daily.   benazepril 20 MG tablet Commonly known as:  LOTENSIN Take 1 tablet (20 mg total) by mouth daily.   buPROPion 150 MG 24 hr tablet Commonly known as:  WELLBUTRIN XL Take 450 mg by mouth daily.   citalopram 40 MG tablet Commonly known as:  CELEXA Take 1 tablet (40 mg total) by mouth daily.   dicyclomine 20 MG tablet Commonly known as:  BENTYL Take one every 6-8hours for abd cramps What changed:  how much to take  how to take this  when to take this  additional instructions   fenofibrate 160 MG tablet Take 1 tablet (160 mg total) by mouth daily.   glipiZIDE 10 MG tablet Commonly known as:  GLUCOTROL TAKE ONE TABLET BY MOUTH EVERY DAY BEFORE BREAKFAST What changed:  how much to take  how to take this  when to take this  additional instructions   metroNIDAZOLE 500 MG tablet Commonly known as:  FLAGYL Take 1 tablet (500 mg total) by mouth 3 (three) times daily. What changed:  when to take this  additional instructions   ondansetron 4 MG tablet Commonly known as:  ZOFRAN Take 1 tablet (4 mg total) by mouth every 8 (eight) hours as needed for nausea or vomiting.   oxyCODONE-acetaminophen 5-325 MG tablet Commonly known as:  PERCOCET Take 0.5-1 tablets by mouth every 4 (four) hours as needed for severe pain.   propranolol 40 MG tablet Commonly known as:  INDERAL Take 1 tablet (40 mg total) by mouth 2 (two) times daily.   sitaGLIPtin-metformin 50-1000 MG tablet Commonly known as:  JANUMET Take 1 tablet by mouth 2 (two) times daily with a meal.   sulfamethoxazole-trimethoprim 800-160 MG tablet Commonly known as:  BACTRIM DS,SEPTRA DS Take 1 tablet by mouth 2 (two) times daily.   topiramate 50 MG tablet Commonly known as:  TOPAMAX Take 1 tablet (50 mg total) by mouth 2 (two) times daily.       Follow-up Information    Mary-Margaret Hassell Done, FNP. Schedule an appointment as soon as possible for a visit in 1 week(s).   Specialty:  Family Medicine Contact information: [address] [place] [address] [phone]          Allergies  Allergen Reactions  . Aspirin Anaphylaxis  . Bee Venom Anaphylaxis  . Benadryl [Diphenhydramine Hcl] Anaphylaxis  . Morphine And Related Shortness Of Breath and Itching  . Vicks Formula 44 Cough-Cold Pm [Dm-Apap-Cpm] Shortness Of Breath and Rash    Not anaphylaxis    Procedures/Studies: Ct Abdomen Pelvis W Contrast  Result Date: 07/13/2016 CLINICAL DATA:  Left-sided pain.  History of diverticulitis. EXAM: CT ABDOMEN AND PELVIS WITH CONTRAST TECHNIQUE: Multidetector CT imaging of the abdomen and pelvis was performed using the standard protocol following bolus administration of intravenous contrast. CONTRAST:  1 ISOVUE-300 IOPAMIDOL (ISOVUE-300) INJECTION 61%, 123mL ISOVUE-300 IOPAMIDOL (ISOVUE-300) INJECTION 61% COMPARISON:  None. FINDINGS: Lower chest: Subsegmental atelectasis noted within the lung bases. Hepatobiliary: No focal liver abnormality is seen. No gallstones, gallbladder wall thickening, or biliary dilatation. Pancreas: Unremarkable. No pancreatic ductal dilatation or surrounding  inflammatory changes. Spleen: Normal in size without focal abnormality. Adrenals/Urinary Tract: The adrenal glands are normal. The right kidney appears normal. Small left renal calculus measures 3 mm, image 39 of series 2. No hydronephrosis or hydroureter. Urinary bladder appears normal. Stomach/Bowel: The stomach appears normal. The small bowel loops have a normal course and caliber. No obstruction. No pathologic dilatation of the colon. Numerous distal colonic diverticula identified. Focal area of inflammation involving the mid descending colon is identified compatible with uncomplicated acute diverticulitis, image 50 of series 2. Vascular/Lymphatic: Aortic  atherosclerosis. No aneurysm. No abdominal or pelvic adenopathy. Reproductive: Status post hysterectomy. No adnexal masses. Other: Previous ventral abdominal wall hernia repair. No free fluid or fluid collections identified. Musculoskeletal: No aggressive lytic or sclerotic bone lesions. IMPRESSION: 1. Uncomplicated acute diverticulitis involves the descending colon. No significant free air or focal fluid collections identified. 2. Nonobstructing left renal calculus 3. Aortic atherosclerosis Electronically Signed   By: Kerby Moors M.D.   On: 07/13/2016 16:06   Ct Abdomen Pelvis W Contrast  Result Date: 07/11/2016 CLINICAL DATA:  Lower abdominal pain for 1 week EXAM: CT ABDOMEN AND PELVIS WITH CONTRAST TECHNIQUE: Multidetector CT imaging of the abdomen and pelvis was performed using the standard protocol following bolus administration of intravenous contrast. CONTRAST:  100 mL Isovue-300 COMPARISON:  07/06/2016 FINDINGS: Lower chest: No acute abnormality. Hepatobiliary: No focal liver abnormality is seen. No gallstones, gallbladder wall thickening, or biliary dilatation. Pancreas: Unremarkable. No pancreatic ductal dilatation or surrounding inflammatory changes. Spleen: Normal in size without focal abnormality. Adrenals/Urinary Tract: The adrenal glands are within normal limits. Punctate nonobstructing left renal stones are again seen. The left ureter is within normal limits. The right kidney is within normal limits. Delayed images demonstrate normal excretion. Stomach/Bowel: Mild inflammatory changes are noted in the mid descending colon consistent with diverticulitis. No micro perforation or abscess is identified. The appendix has been surgically removed. No other inflammatory changes are noted. Vascular/Lymphatic: Aortic atherosclerosis. No enlarged abdominal or pelvic lymph nodes. Reproductive: Changes of partial hysterectomy are noted. No ovarian mass lesion is seen. Other: Postsurgical changes are noted  in the anterior abdominal wall. No recurrent hernia is noted. Musculoskeletal: No acute bony abnormality seen. IMPRESSION: Punctate nonobstructing left renal stone stable from the prior exam. New changes of mild diverticulitis in the mid descending colon. Electronically Signed   By: Inez Catalina M.D.   On: 07/11/2016 08:33   Ct Abdomen Pelvis W Contrast  Result Date: 07/06/2016 CLINICAL DATA:  Lower abdominal pain with nausea and vomiting 5 days. Diarrhea. EXAM: CT ABDOMEN AND PELVIS WITH CONTRAST TECHNIQUE: Multidetector CT imaging of the abdomen and pelvis was performed using the standard protocol following bolus administration of intravenous contrast. CONTRAST:  58mL ISOVUE-300 IOPAMIDOL (ISOVUE-300) INJECTION 61%, 135mL ISOVUE-300 IOPAMIDOL (ISOVUE-300) INJECTION 61% COMPARISON:  02/20/2010 FINDINGS: Lower chest: The lung bases are within normal. Hepatobiliary: Gallbladder, liver and biliary tree are within normal. Pancreas: Within normal. Spleen: Within normal. Adrenals/Urinary Tract: Adrenal glands are normal. Kidneys are normal in size. Punctate stone over the mid pole collecting system of the left kidney. No hydronephrosis or focal mass. Ureters are normal. Bladder is decompressed and otherwise within normal. Stomach/Bowel: Stomach and small bowel are within normal. Previous appendectomy. Subtle diverticulosis of the colon. Vascular/Lymphatic: Minimal calcified plaque over the abdominal aorta and iliac arteries. Remaining vascular structures are within normal. No evidence of adenopathy. Reproductive: Previous hysterectomy.  Ovaries not visualized. Other: No free fluid or focal inflammatory change. Surgical clips over the abdominal  wall compatible previous ventral hernia repair. Musculoskeletal: Mild degenerate change of the spine and hips. IMPRESSION: No acute findings in the abdomen/pelvis. Punctate nonobstructing left renal stone. Minimal diverticulosis of the colon. Evidence of previous ventral hernia  repair. Aortic atherosclerosis. Electronically Signed   By: Marin Olp M.D.   On: 07/06/2016 21:29   Dg Chest Port 1 View  Result Date: 07/13/2016 CLINICAL DATA:  Diverticulitis EXAM: PORTABLE CHEST 1 VIEW COMPARISON:  04/07/2016 FINDINGS: Lungs are clear.  No pleural effusion or pneumothorax. The heart is normal in size. IMPRESSION: No evidence of acute cardiopulmonary disease. Electronically Signed   By: Julian Hy M.D.   On: 07/13/2016 11:18    (Echo, Carotid, EGD, Colonoscopy, ERCP)    Subjective: Pt says she feels much better, she is eating and drinking well.  Wants to go home.   Discharge Exam: Vitals:   07/16/16 0500 07/16/16 0900  BP: 116/61 (!) 115/52  Pulse: 62   Resp: 18   Temp: 97.8 F (36.6 C)    Vitals:   07/15/16 1623 07/15/16 2116 07/16/16 0500 07/16/16 0900  BP: 119/64 (!) 118/52 116/61 (!) 115/52  Pulse: 64 (!) 57 62   Resp: 18 18 18    Temp: 98.1 F (36.7 C) 97.7 F (36.5 C) 97.8 F (36.6 C)   TempSrc: Oral Oral Oral   SpO2: 100% 97% 99%   Weight:      Height:       General exam: awake, alert, NAD, cooperative.  Respiratory system: Clear. No increased work of breathing. Cardiovascular system: S1 & S2 heard, RRR.  Gastrointestinal system: Abdomen is nondistended, soft with no LUQ or LLQ tenderness, No guarding no rebound tenderness. Normal bowel sounds heard. Central nervous system: Alert and oriented. No focal neurological deficits. Extremities: no cyanosis, TED hoses bilateral LE.   The results of significant diagnostics from this hospitalization (including imaging, microbiology, ancillary and laboratory) are listed below for reference.     Microbiology: Recent Results (from the past 240 hour(s))  C difficile quick scan w PCR reflex     Status: None   Collection Time: 07/11/16  8:50 AM  Result Value Ref Range Status   C Diff antigen NEGATIVE NEGATIVE Final   C Diff toxin NEGATIVE NEGATIVE Final   C Diff interpretation No C. difficile  detected.  Final  Culture, blood (Routine X 2) w Reflex to ID Panel     Status: None (Preliminary result)   Collection Time: 07/13/16 11:34 AM  Result Value Ref Range Status   Specimen Description RIGHT ANTECUBITAL  Final   Special Requests BOTTLES DRAWN AEROBIC AND ANAEROBIC 6CC EACH  Final   Culture NO GROWTH 3 DAYS  Final   Report Status PENDING  Incomplete  Urine culture     Status: None   Collection Time: 07/13/16 11:49 AM  Result Value Ref Range Status   Specimen Description URINE, CLEAN CATCH  Final   Special Requests NONE  Final   Culture   Final    NO GROWTH Performed at [facility], [address], [place], [place] [address]    Report Status 07/15/2016 FINAL  Final  Culture, blood (Routine X 2) w Reflex to ID Panel     Status: None (Preliminary result)   Collection Time: 07/13/16 11:50 AM  Result Value Ref Range Status   Specimen Description BLOOD RIGHT HAND  Final   Special Requests BOTTLES DRAWN AEROBIC AND ANAEROBIC 4CC EACH  Final   Culture  Setup Time  Final    GRAM NEGATIVE RODS Gram Stain Report Called to,Read Back By and Verified With: KNIGHT,C AT [id number] ON 07/14/2016 BY EVA ANAEROBIC BOTTLE ONLY Performed at [facility], READ BACK BY AND VERIFIED WITH: L SEAY,PHARMD AT 0737 07/15/16 BY L BENFIELD    Culture   Final    GRAM NEGATIVE RODS HOLDING FOR POSSIBLE ANAEROBE Performed at [facility], [place] [address], [place], [place] [address]    Report Status PENDING  Incomplete  Blood Culture ID Panel (Reflexed)     Status: None   Collection Time: 07/13/16 11:50 AM  Result Value Ref Range Status   Enterococcus species NOT DETECTED NOT DETECTED Final   Listeria monocytogenes NOT DETECTED NOT DETECTED Final   Staphylococcus species NOT DETECTED NOT DETECTED Final   Staphylococcus aureus NOT DETECTED NOT DETECTED Final   Streptococcus species NOT DETECTED NOT DETECTED Final   Streptococcus agalactiae NOT DETECTED NOT  DETECTED Final   Streptococcus pneumoniae NOT DETECTED NOT DETECTED Final   Streptococcus pyogenes NOT DETECTED NOT DETECTED Final   Acinetobacter baumannii NOT DETECTED NOT DETECTED Final   Enterobacteriaceae species NOT DETECTED NOT DETECTED Final   Enterobacter cloacae complex NOT DETECTED NOT DETECTED Final   Escherichia coli NOT DETECTED NOT DETECTED Final   Klebsiella oxytoca NOT DETECTED NOT DETECTED Final   Klebsiella pneumoniae NOT DETECTED NOT DETECTED Final   Proteus species NOT DETECTED NOT DETECTED Final   Serratia marcescens NOT DETECTED NOT DETECTED Final   Haemophilus influenzae NOT DETECTED NOT DETECTED Final   Neisseria meningitidis NOT DETECTED NOT DETECTED Final   Pseudomonas aeruginosa NOT DETECTED NOT DETECTED Final   Candida albicans NOT DETECTED NOT DETECTED Final   Candida glabrata NOT DETECTED NOT DETECTED Final   Candida krusei NOT DETECTED NOT DETECTED Final   Candida parapsilosis NOT DETECTED NOT DETECTED Final   Candida tropicalis NOT DETECTED NOT DETECTED Final    Comment: Performed at [facility], [place] [address], [place], [place] [address]     Labs: BNP (last 3 results) No results for input(s): BNP in the last 8760 hours. Basic Metabolic Panel:  Recent Labs Lab 07/11/16 0636 07/13/16 1134 07/14/16 0607 07/15/16 0515 07/16/16 0608  NA 135 135 136 138 138  K 3.8 3.8 4.0 3.7 3.8  CL 102 105 106 105 107  CO2 24 22 23 23 24   GLUCOSE 192* 62* 132* 137* 164*  BUN 18 10 9 12 11   CREATININE 0.90 0.89 0.78 0.95 1.02*  CALCIUM 9.5 9.3 9.0 9.4 9.2   Liver Function Tests:  Recent Labs Lab 07/11/16 0636 07/13/16 1134 07/14/16 0607 07/15/16 0515 07/16/16 0608  AST 19 18 14* 22 17  ALT 35 23 21 30 29   ALKPHOS 51 51 46 45 41  BILITOT 0.7 0.6 0.5 0.6 0.4  PROT 7.2 6.8 6.6 6.5 6.2*  ALBUMIN 4.3 3.7 3.5 3.6 3.4*    Recent Labs Lab 07/11/16 0636  LIPASE 29   No results for input(s): AMMONIA in the last 168 hours. CBC:  Recent  Labs Lab 07/11/16 0636 07/13/16 1134 07/14/16 0607 07/15/16 0515 07/16/16 0608  WBC 10.5 9.0 10.3 7.8 5.3  NEUTROABS 8.0* 5.5 7.1 3.4 2.5  HGB 12.9 11.6* 11.2* 11.5* 11.9*  HCT 39.0 35.8* 33.8* 34.8* 36.2  MCV 85.9 87.5 87.1 87.4 87.2  PLT 218 215 219 222 184   Cardiac Enzymes: No results for input(s): CKTOTAL, CKMB, CKMBINDEX, TROPONINI in the last 168 hours. BNP:  Invalid input(s): POCBNP CBG:  Recent Labs Lab 07/15/16 0739 07/15/16 1131 07/15/16 1621 07/15/16 2115 07/16/16 0725  GLUCAP 137* 170* 146* 187* 172*   D-Dimer No results for input(s): DDIMER in the last 72 hours. Hgb A1c  Recent Labs  07/13/16 1134  HGBA1C 7.4*   Lipid Profile No results for input(s): CHOL, HDL, LDLCALC, TRIG, CHOLHDL, LDLDIRECT in the last 72 hours. Thyroid function studies  Recent Labs  07/13/16 1134  TSH 3.813   Anemia work up No results for input(s): VITAMINB12, FOLATE, FERRITIN, TIBC, IRON, RETICCTPCT in the last 72 hours. Urinalysis    Component Value Date/Time   COLORURINE YELLOW 07/11/2016 0645   APPEARANCEUR CLEAR 07/11/2016 0645   LABSPEC 1.018 07/11/2016 0645   PHURINE 5.0 07/11/2016 0645   GLUCOSEU NEGATIVE 07/11/2016 0645   HGBUR NEGATIVE 07/11/2016 0645   BILIRUBINUR NEGATIVE 07/11/2016 0645   BILIRUBINUR neg 11/12/2014 1206   KETONESUR NEGATIVE 07/11/2016 0645   PROTEINUR NEGATIVE 07/11/2016 0645   UROBILINOGEN negative 11/12/2014 1206   NITRITE NEGATIVE 07/11/2016 0645   LEUKOCYTESUR NEGATIVE 07/11/2016 0645   Sepsis Labs Invalid input(s): PROCALCITONIN,  WBC,  LACTICIDVEN Microbiology Recent Results (from the past 240 hour(s))  C difficile quick scan w PCR reflex     Status: None   Collection Time: 07/11/16  8:50 AM  Result Value Ref Range Status   C Diff antigen NEGATIVE NEGATIVE Final   C Diff toxin NEGATIVE NEGATIVE Final   C Diff interpretation No C. difficile detected.  Final  Culture, blood (Routine X 2) w Reflex to ID Panel     Status:  None (Preliminary result)   Collection Time: 07/13/16 11:34 AM  Result Value Ref Range Status   Specimen Description RIGHT ANTECUBITAL  Final   Special Requests BOTTLES DRAWN AEROBIC AND ANAEROBIC 6CC EACH  Final   Culture NO GROWTH 3 DAYS  Final   Report Status PENDING  Incomplete  Urine culture     Status: None   Collection Time: 07/13/16 11:49 AM  Result Value Ref Range Status   Specimen Description URINE, CLEAN CATCH  Final   Special Requests NONE  Final   Culture   Final    NO GROWTH Performed at [facility], [address], [place], [place] [address]    Report Status 07/15/2016 FINAL  Final  Culture, blood (Routine X 2) w Reflex to ID Panel     Status: None (Preliminary result)   Collection Time: 07/13/16 11:50 AM  Result Value Ref Range Status   Specimen Description BLOOD RIGHT HAND  Final   Special Requests BOTTLES DRAWN AEROBIC AND ANAEROBIC 4CC EACH  Final   Culture  Setup Time   Final    GRAM NEGATIVE RODS Gram Stain Report Called to,Read Back By and Verified With: KNIGHT,C AT [id number] ON 07/14/2016 BY EVA ANAEROBIC BOTTLE ONLY Performed at [place], READ BACK BY AND VERIFIED WITH: L SEAY,PHARMD AT 0737 07/15/16 BY L BENFIELD    Culture   Final    GRAM NEGATIVE RODS HOLDING FOR POSSIBLE ANAEROBE Performed at [facility], [place] [address] [place], [place] [address]    Report Status PENDING  Incomplete  Blood Culture ID Panel (Reflexed)     Status: None   Collection Time: 07/13/16 11:50 AM  Result Value Ref Range Status   Enterococcus species NOT DETECTED NOT DETECTED Final   Listeria monocytogenes NOT DETECTED NOT DETECTED Final   Staphylococcus species NOT DETECTED NOT  DETECTED Final   Staphylococcus aureus NOT DETECTED NOT DETECTED Final   Streptococcus species NOT DETECTED NOT DETECTED Final   Streptococcus agalactiae NOT DETECTED NOT DETECTED Final   Streptococcus pneumoniae NOT DETECTED NOT DETECTED Final    Streptococcus pyogenes NOT DETECTED NOT DETECTED Final   Acinetobacter baumannii NOT DETECTED NOT DETECTED Final   Enterobacteriaceae species NOT DETECTED NOT DETECTED Final   Enterobacter cloacae complex NOT DETECTED NOT DETECTED Final   Escherichia coli NOT DETECTED NOT DETECTED Final   Klebsiella oxytoca NOT DETECTED NOT DETECTED Final   Klebsiella pneumoniae NOT DETECTED NOT DETECTED Final   Proteus species NOT DETECTED NOT DETECTED Final   Serratia marcescens NOT DETECTED NOT DETECTED Final   Haemophilus influenzae NOT DETECTED NOT DETECTED Final   Neisseria meningitidis NOT DETECTED NOT DETECTED Final   Pseudomonas aeruginosa NOT DETECTED NOT DETECTED Final   Candida albicans NOT DETECTED NOT DETECTED Final   Candida glabrata NOT DETECTED NOT DETECTED Final   Candida krusei NOT DETECTED NOT DETECTED Final   Candida parapsilosis NOT DETECTED NOT DETECTED Final   Candida tropicalis NOT DETECTED NOT DETECTED Final    Comment: Performed at [facility], [place] [address] [place], [place] [address]   Time coordinating discharge: 32 minutes  SIGNED:  Irwin Brakeman, MD  [facility] 07/16/2016, 9:50 AM Pager   If 7PM-7AM, please contact night-coverage [email or web address] Password [email or web address]

## 2016-07-18 LAB — CULTURE, BLOOD (ROUTINE X 2)
CULTURE: NO GROWTH
Culture  Setup Time: NONE SEEN
Culture: NO GROWTH

## 2016-07-25 ENCOUNTER — Ambulatory Visit: Payer: BLUE CROSS/BLUE SHIELD | Admitting: Pediatrics

## 2016-07-30 ENCOUNTER — Telehealth: Payer: Self-pay | Admitting: Nurse Practitioner

## 2016-07-30 NOTE — Telephone Encounter (Signed)
Called pt, requested that she have them fax another FMLA to my direct line

## 2016-08-14 ENCOUNTER — Other Ambulatory Visit: Payer: Self-pay

## 2016-08-14 DIAGNOSIS — F3341 Major depressive disorder, recurrent, in partial remission: Secondary | ICD-10-CM

## 2016-08-14 MED ORDER — BUPROPION HCL ER (XL) 150 MG PO TB24: 450.0000 mg | ORAL_TABLET | Freq: Every day | ORAL | 0 refills | Status: DC

## 2016-08-14 MED ORDER — CITALOPRAM HYDROBROMIDE 40 MG PO TABS: 40.0000 mg | ORAL_TABLET | Freq: Every day | ORAL | 0 refills | Status: DC

## 2016-09-21 ENCOUNTER — Other Ambulatory Visit: Payer: Self-pay | Admitting: Nurse Practitioner

## 2016-11-07 ENCOUNTER — Encounter: Payer: BLUE CROSS/BLUE SHIELD | Admitting: *Deleted

## 2016-11-12 ENCOUNTER — Emergency Department (INDEPENDENT_AMBULATORY_CARE_PROVIDER_SITE_OTHER)
Admission: EM | Admit: 2016-11-12 | Discharge: 2016-11-12 | Disposition: A | Discharge status: Home / Self Care | Payer: Worker's Compensation | Source: Home / Self Care | Attending: Family Medicine | Admitting: Family Medicine

## 2016-11-12 ENCOUNTER — Encounter: Payer: Self-pay | Admitting: Emergency Medicine

## 2016-11-12 DIAGNOSIS — T63461D Toxic effect of venom of wasps, accidental (unintentional), subsequent encounter: Secondary | ICD-10-CM | POA: Diagnosis not present

## 2016-11-12 MED ORDER — METHYLPREDNISOLONE ACETATE 80 MG/ML IJ SUSP
40.0000 mg | Freq: Once | INTRAMUSCULAR | Status: AC
  Administered 2016-11-12: 40 mg via INTRAMUSCULAR

## 2016-11-12 NOTE — ED Triage Notes (Signed)
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 [facility] She is here for f/u. No concerns and she is feeling well.

## 2016-11-12 NOTE — Discharge Instructions (Signed)
Rest, increase fluid intake.   If symptoms become significantly worse during the night or over the weekend, proceed to the local emergency room.  

## 2016-11-12 NOTE — ED Provider Notes (Signed)
[facility]    CSN: [id number] Arrival date & time: 11/12/16  1039     History   Chief Complaint Chief Complaint  Patient presents with  . Insect Bite    HPI [name] is a 56 y.o. female.   Patient has a history of bee sting allergy.  She was stung by a wasp about 2.5 hours ago and immediately proceeded to a [facility] where she received Epipen injection.  She had developed urticaria on her upper chest but no difficulty swallowing, wheezing, or shortness of breath.  Her urticaria resolved, and she now feels well.  She is unable to take antihistamines. She presents for follow-up at recommendation of [facility].   The history is provided by the patient.    Past Medical History:  Diagnosis Date  . Abdominal discomfort    Chronic abdominal and pelvic pain resulting in significant loss of time from work  . Anxiety    with depression  . Asthma   . Chest pain   . Diabetes mellitus   . Drug overdose 2009   60 Naprosyn, psychiatric admission  . Hyperlipidemia    Severe  . Hypertension   . Hypothyroidism   . Migraines     Patient Active Problem List   Diagnosis Date Noted  . Acute diverticulitis 07/13/2016  . LLQ abdominal pain 07/13/2016  . Morbid obesity ([place]) 03/31/2015  . Hyperlipidemia   . Hypothyroidism   . Hypertension   . POLYP, COLON 04/13/2010  . Diabetes ([place]) 04/13/2010  . Anxiety state 04/13/2010  . Depression 04/13/2010  . Asthma 04/13/2010  . Migraines 04/13/2010    Past Surgical History:  Procedure Laterality Date  . ABDOMINAL HYSTERECTOMY  02/2007   + lysis of adhesions  . APPENDECTOMY  1990s   ruptured, late 90s  . BILATERAL OOPHORECTOMY      in 2 surgeries prior to 9/08  . COLONOSCOPY  02/2010    normal upper endoscopy, single colonic polyp,  . VENTRAL HERNIA REPAIR     x3    OB History    No data available       Home Medications    Prior to Admission medications   Medication Sig Start Date End Date  Taking? Authorizing Provider  acetaminophen (TYLENOL) 500 MG tablet Take 500 mg by mouth every 6 (six) hours as needed for mild pain or moderate pain.    [provider]  albuterol (PROVENTIL HFA;VENTOLIN HFA) 108 (90 Base) MCG/ACT inhaler Inhale 2 puffs into the lungs every 4 (four) hours as needed for wheezing or shortness of breath. 04/07/16   Kandra Nicolas, MD  atorvastatin (LIPITOR) 40 MG tablet Take 1 tablet (40 mg total) by mouth daily. 06/25/16   Hassell Done, Mary-Margaret, FNP  benazepril (LOTENSIN) 20 MG tablet Take 1 tablet (20 mg total) by mouth daily. 06/25/16   Hassell Done, Mary-Margaret, FNP  buPROPion (WELLBUTRIN XL) 150 MG 24 hr tablet TAKE 3 TABLETS (450 MG TOTAL) BY MOUTH DAILY. 09/24/16   Hassell Done, Mary-Margaret, FNP  citalopram (CELEXA) 40 MG tablet Take 1 tablet (40 mg total) by mouth daily. 08/14/16   Hassell Done, Mary-Margaret, FNP  dicyclomine (BENTYL) 20 MG tablet Take one every 6-8hours for abd cramps Patient taking differently: Take 20 mg by mouth as directed. Take one every 6-8hours for abd cramps 07/06/16   Milton Ferguson, MD  fenofibrate 160 MG tablet Take 1 tablet (160 mg total) by mouth daily. 06/25/16   Hassell Done Mary-Margaret, FNP  glipiZIDE (GLUCOTROL) 10  MG tablet TAKE ONE TABLET BY MOUTH EVERY DAY BEFORE BREAKFAST Patient taking differently: Take 10 mg by mouth daily before breakfast. TAKE ONE TABLET BY MOUTH EVERY DAY BEFORE BREAKFAST 06/25/16   Hassell Done, Mary-Margaret, FNP  ondansetron (ZOFRAN) 4 MG tablet Take 1 tablet (4 mg total) by mouth every 8 (eight) hours as needed for nausea or vomiting. 07/04/16   Eustaquio Maize, MD  oxyCODONE-acetaminophen (PERCOCET) 5-325 MG tablet Take 0.5-1 tablets by mouth every 4 (four) hours as needed for severe pain. 07/11/16   Mesner, Corene Cornea, MD  propranolol (INDERAL) 40 MG tablet Take 1 tablet (40 mg total) by mouth 2 (two) times daily. 06/25/16   Hassell Done Mary-Margaret, FNP  sitaGLIPtin-metformin (JANUMET) 50-1000 MG tablet Take 1 tablet by  mouth 2 (two) times daily with a meal. 06/25/16   Hassell Done, Mary-Margaret, FNP  topiramate (TOPAMAX) 50 MG tablet Take 1 tablet (50 mg total) by mouth 2 (two) times daily. 06/25/16   Chevis Pretty, [place]    Family History History reviewed. No pertinent family history.  Social History Social History  Substance Use Topics  . Smoking status: Never Smoker  . Smokeless tobacco: Never Used  . Alcohol use No     Allergies   Aspirin; Bee venom; Benadryl [diphenhydramine hcl]; Morphine and related; and Vicks formula 44 cough-cold pm [dm-apap-cpm]   Review of Systems Review of Systems  Constitutional: Negative for diaphoresis, fatigue and fever.  HENT: Negative for congestion.   Eyes: Negative.   Respiratory: Negative for cough, choking, chest tightness, shortness of breath, wheezing and stridor.   Cardiovascular: Negative for chest pain and palpitations.  Gastrointestinal: Negative.   Genitourinary: Negative.   Musculoskeletal: Negative.   Skin: Negative for rash.  Neurological: Negative for headaches.     Physical Exam Triage Vital Signs ED Triage Vitals  Enc Vitals Group     BP 11/12/16 1054 134/70     Pulse Rate 11/12/16 1054 66     Resp --      Temp 11/12/16 1054 98 F (36.7 C)     Temp Source 11/12/16 1054 Oral     SpO2 11/12/16 1054 98 %     Weight --      Height --      Head Circumference --      Peak Flow --      Pain Score 11/12/16 1055 0     Pain Loc --      Pain Edu? --      Excl. in [place]? --    No data found.   Updated Vital Signs BP 134/70 (BP Location: Right Arm)   Pulse 66   Temp 98 F (36.7 C) (Oral)   SpO2 98%   Visual Acuity Right Eye Distance:   Left Eye Distance:   Bilateral Distance:    Right Eye Near:   Left Eye Near:    Bilateral Near:     Physical Exam Nursing notes and Vital Signs reviewed. Appearance:  Patient appears stated age, and in no acute distress.    Eyes:  Pupils are equal, round, and reactive to light and  accomodation.  Extraocular movement is intact.  Conjunctivae are not inflamed   Pharynx:  Normal; moist mucous membranes  Neck:  Supple.  No adenopathy Lungs:  Clear to auscultation.  Breath sounds are equal.  Moving air well. Heart:  Regular rate and rhythm without murmurs, rubs, or gallops.  Abdomen:  Nontender without masses or hepatosplenomegaly.  Bowel sounds are present.  No CVA  or flank tenderness.  Extremities:  No edema.  Skin:  No rash present.     UC Treatments / Results  Labs (all labs ordered are listed, but only abnormal results are displayed) Labs Reviewed - No data to display  EKG  EKG Interpretation None       Radiology No results found.  Procedures Procedures (including critical care time)  Medications Ordered in UC Medications  methylPREDNISolone acetate (DEPO-MEDROL) injection 40 mg (40 mg Intramuscular Given 11/12/16 1130)     Initial Impression / Assessment and Plan / UC Course  I have reviewed the triage vital signs and the nursing notes.  Pertinent labs & imaging results that were available during my care of the patient were reviewed by me and considered in my medical decision making (see chart for details).    Patient now completely assymptomatic after wasp sting earlier this morning. She is unable to take antihistamines. Administered Depo Medrol 40mg  IM. Rest, increase fluid intake. If symptoms become significantly worse during the night or over the weekend, proceed to the local emergency room.     Final Clinical Impressions(s) / UC Diagnoses   Final diagnoses:  Wasp sting, accidental or unintentional, subsequent encounter    New Prescriptions New Prescriptions   No medications on file     Kandra Nicolas, MD 11/12/16 1137

## 2016-11-14 ENCOUNTER — Telehealth: Payer: Self-pay | Admitting: Nurse Practitioner

## 2016-11-28 LAB — HM DIABETES EYE EXAM

## 2016-12-17 ENCOUNTER — Other Ambulatory Visit: Payer: Self-pay | Admitting: Nurse Practitioner

## 2016-12-17 DIAGNOSIS — E1142 Type 2 diabetes mellitus with diabetic polyneuropathy: Secondary | ICD-10-CM

## 2016-12-17 NOTE — Telephone Encounter (Signed)
LMOVM NTBS before next refill 

## 2016-12-17 NOTE — Telephone Encounter (Signed)
07/13/16  Dr Wendi Snipes

## 2016-12-17 NOTE — Telephone Encounter (Signed)
Last refill without being seen 

## 2017-01-01 ENCOUNTER — Ambulatory Visit: Payer: BLUE CROSS/BLUE SHIELD | Admitting: Nurse Practitioner

## 2017-01-07 ENCOUNTER — Other Ambulatory Visit: Payer: Self-pay | Admitting: Nurse Practitioner

## 2017-01-07 DIAGNOSIS — E782 Mixed hyperlipidemia: Secondary | ICD-10-CM

## 2017-01-08 ENCOUNTER — Ambulatory Visit (INDEPENDENT_AMBULATORY_CARE_PROVIDER_SITE_OTHER): Payer: BLUE CROSS/BLUE SHIELD | Admitting: Nurse Practitioner

## 2017-01-08 ENCOUNTER — Encounter: Payer: Self-pay | Admitting: Nurse Practitioner

## 2017-01-08 VITALS — BP 134/87 | HR 67 | Temp 97.1°F | Ht 64.0 in | Wt 221.0 lb

## 2017-01-08 DIAGNOSIS — E1142 Type 2 diabetes mellitus with diabetic polyneuropathy: Secondary | ICD-10-CM

## 2017-01-08 DIAGNOSIS — G43711 Chronic migraine without aura, intractable, with status migrainosus: Secondary | ICD-10-CM

## 2017-01-08 DIAGNOSIS — J45909 Unspecified asthma, uncomplicated: Secondary | ICD-10-CM

## 2017-01-08 DIAGNOSIS — F3341 Major depressive disorder, recurrent, in partial remission: Secondary | ICD-10-CM

## 2017-01-08 DIAGNOSIS — E039 Hypothyroidism, unspecified: Secondary | ICD-10-CM

## 2017-01-08 DIAGNOSIS — E782 Mixed hyperlipidemia: Secondary | ICD-10-CM | POA: Diagnosis not present

## 2017-01-08 DIAGNOSIS — F411 Generalized anxiety disorder: Secondary | ICD-10-CM

## 2017-01-08 DIAGNOSIS — I10 Essential (primary) hypertension: Secondary | ICD-10-CM | POA: Diagnosis not present

## 2017-01-08 DIAGNOSIS — K5730 Diverticulosis of large intestine without perforation or abscess without bleeding: Secondary | ICD-10-CM

## 2017-01-08 LAB — BAYER DCA HB A1C WAIVED: HB A1C: 6.4 % (ref <7.0)

## 2017-01-08 MED ORDER — TOPIRAMATE 50 MG PO TABS: 50.0000 mg | ORAL_TABLET | Freq: Two times a day (BID) | ORAL | 5 refills | Status: DC

## 2017-01-08 MED ORDER — PROPRANOLOL HCL 40 MG PO TABS: 40.0000 mg | ORAL_TABLET | Freq: Two times a day (BID) | ORAL | 5 refills | Status: DC

## 2017-01-08 MED ORDER — BENAZEPRIL HCL 20 MG PO TABS: 20.0000 mg | ORAL_TABLET | Freq: Every day | ORAL | 5 refills | Status: DC

## 2017-01-08 MED ORDER — SITAGLIPTIN PHOS-METFORMIN HCL 50-1000 MG PO TABS: 1.0000 | ORAL_TABLET | Freq: Two times a day (BID) | ORAL | 5 refills | Status: DC

## 2017-01-08 MED ORDER — DICYCLOMINE HCL 20 MG PO TABS: ORAL_TABLET | ORAL | 0 refills | Status: DC

## 2017-01-08 MED ORDER — GLIPIZIDE 10 MG PO TABS: ORAL_TABLET | ORAL | 5 refills | Status: DC

## 2017-01-08 MED ORDER — BUPROPION HCL ER (XL) 150 MG PO TB24: 450.0000 mg | ORAL_TABLET | Freq: Every day | ORAL | 0 refills | Status: DC

## 2017-01-08 MED ORDER — CITALOPRAM HYDROBROMIDE 40 MG PO TABS: 40.0000 mg | ORAL_TABLET | Freq: Every day | ORAL | 5 refills | Status: DC

## 2017-01-08 MED ORDER — FENOFIBRATE 160 MG PO TABS: 160.0000 mg | ORAL_TABLET | Freq: Every day | ORAL | 5 refills | Status: DC

## 2017-01-08 MED ORDER — ATORVASTATIN CALCIUM 40 MG PO TABS: 40.0000 mg | ORAL_TABLET | Freq: Every day | ORAL | 0 refills | Status: DC

## 2017-01-08 NOTE — Patient Instructions (Signed)
Stress and Stress Management Stress is a normal reaction to life events. It is what you feel when life demands more than you are used to or more than you can handle. Some stress can be useful. For example, the stress reaction can help you catch the last bus of the day, study for a test, or meet a deadline at work. But stress that occurs too often or for too long can cause problems. It can affect your emotional health and interfere with relationships and normal daily activities. Too much stress can weaken your immune system and increase your risk for physical illness. If you already have a medical problem, stress can make it worse. What are the causes? All sorts of life events may cause stress. An event that causes stress for one person may not be stressful for another person. Major life events commonly cause stress. These may be positive or negative. Examples include losing your job, moving into a new home, getting married, having a baby, or losing a loved one. Less obvious life events may also cause stress, especially if they occur day after day or in combination. Examples include working long hours, driving in traffic, caring for children, being in debt, or being in a difficult relationship. What are the signs or symptoms? Stress may cause emotional symptoms including, the following:  Anxiety. This is feeling worried, afraid, on edge, overwhelmed, or out of control.  Anger. This is feeling irritated or impatient.  Depression. This is feeling sad, down, helpless, or guilty.  Difficulty focusing, remembering, or making decisions.  Stress may cause physical symptoms, including the following:  Aches and pains. These may affect your head, neck, back, stomach, or other areas of your body.  Tight muscles or clenched jaw.  Low energy or trouble sleeping.  Stress may cause unhealthy behaviors, including the following:  Eating to feel better (overeating) or skipping meals.  Sleeping too little,  too much, or both.  Working too much or putting off tasks (procrastination).  Smoking, drinking alcohol, or using drugs to feel better.  How is this diagnosed? Stress is diagnosed through an assessment by your health care provider. Your health care provider will ask questions about your symptoms and any stressful life events.Your health care provider will also ask about your medical history and may order blood tests or other tests. Certain medical conditions and medicine can cause physical symptoms similar to stress. Mental illness can cause emotional symptoms and unhealthy behaviors similar to stress. Your health care provider may refer you to a mental health professional for further evaluation. How is this treated? Stress management is the recommended treatment for stress.The goals of stress management are reducing stressful life events and coping with stress in healthy ways. Techniques for reducing stressful life events include the following:  Stress identification. Self-monitor for stress and identify what causes stress for you. These skills may help you to avoid some stressful events.  Time management. Set your priorities, keep a calendar of events, and learn to say "no." These tools can help you avoid making too many commitments.  Techniques for coping with stress include the following:  Rethinking the problem. Try to think realistically about stressful events rather than ignoring them or overreacting. Try to find the positives in a stressful situation rather than focusing on the negatives.  Exercise. Physical exercise can release both physical and emotional tension. The key is to find a form of exercise you enjoy and do it regularly.  Relaxation techniques. These relax the body and  mind. Examples include yoga, meditation, tai chi, biofeedback, deep breathing, progressive muscle relaxation, listening to music, being out in nature, journaling, and other hobbies. Again, the key is to find  one or more that you enjoy and can do regularly.  Healthy lifestyle. Eat a balanced diet, get plenty of sleep, and do not smoke. Avoid using alcohol or drugs to relax.  Strong support network. Spend time with family, friends, or other people you enjoy being around.Express your feelings and talk things over with someone you trust.  Counseling or talktherapy with a mental health professional may be helpful if you are having difficulty managing stress on your own. Medicine is typically not recommended for the treatment of stress.Talk to your health care provider if you think you need medicine for symptoms of stress. Follow these instructions at home:  Keep all follow-up visits as directed by your health care provider.  Take all medicines as directed by your health care provider. Contact a health care provider if:  Your symptoms get worse or you start having new symptoms.  You feel overwhelmed by your problems and can no longer manage them on your own. Get help right away if:  You feel like hurting yourself or someone else. This information is not intended to replace advice given to you by your health care provider. Make sure you discuss any questions you have with your health care provider. Document Released: 11/21/2000 Document Revised: 11/03/2015 Document Reviewed: 01/20/2013 Elsevier Interactive Patient Education  2017 Elsevier Inc.  

## 2017-01-08 NOTE — Progress Notes (Signed)
Subjective:    Patient ID: [name], female    DOB: [birth year], 56 y.o.   MRN: [id number]  HPI [name] is here today for follow up of chronic medical problem.  Outpatient Encounter Prescriptions as of 01/08/2017  Medication Sig  . acetaminophen (TYLENOL) 500 MG tablet Take 500 mg by mouth every 6 (six) hours as needed for mild pain or moderate pain.  Marland Kitchen albuterol (PROVENTIL HFA;VENTOLIN HFA) 108 (90 Base) MCG/ACT inhaler Inhale 2 puffs into the lungs every 4 (four) hours as needed for wheezing or shortness of breath.  Marland Kitchen atorvastatin (LIPITOR) 40 MG tablet TAKE 1 TABLET (40 MG TOTAL) BY MOUTH DAILY.  . benazepril (LOTENSIN) 20 MG tablet Take 1 tablet (20 mg total) by mouth daily.  Marland Kitchen buPROPion (WELLBUTRIN XL) 150 MG 24 hr tablet TAKE 3 TABLETS (450 MG TOTAL) BY MOUTH DAILY.  . citalopram (CELEXA) 40 MG tablet Take 1 tablet (40 mg total) by mouth daily.  Marland Kitchen dicyclomine (BENTYL) 20 MG tablet Take one every 6-8hours for abd cramps (Patient taking differently: Take 20 mg by mouth as directed. Take one every 6-8hours for abd cramps)  . fenofibrate 160 MG tablet TAKE 1 TABLET BY MOUTH EVERY DAY  . glipiZIDE (GLUCOTROL) 10 MG tablet TAKE ONE TABLET BY MOUTH EVERY DAY BEFORE BREAKFAST  . ondansetron (ZOFRAN) 4 MG tablet Take 1 tablet (4 mg total) by mouth every 8 (eight) hours as needed for nausea or vomiting.  Marland Kitchen oxyCODONE-acetaminophen (PERCOCET) 5-325 MG tablet Take 0.5-1 tablets by mouth every 4 (four) hours as needed for severe pain.  Marland Kitchen propranolol (INDERAL) 40 MG tablet Take 1 tablet (40 mg total) by mouth 2 (two) times daily.  . sitaGLIPtin-metformin (JANUMET) 50-1000 MG tablet Take 1 tablet by mouth 2 (two) times daily with a meal.  . topiramate (TOPAMAX) 50 MG tablet Take 1 tablet (50 mg total) by mouth 2 (two) times daily.   No facility-administered encounter medications on file as of 01/08/2017.     1. Type 2 diabetes mellitus with diabetic polyneuropathy, without  long-term current use of insulin ([place]) last hgba1c was 7.4%. She does not check blood sugars every day. Bit they are usually between 130-150 fasting. No hypoglycemia as of late  2. Essential hypertension  No c/o chest pain , SOB or headache. Does not check blood pressures at home  3. Mixed hyperlipidemia  Does not watch diet at all  4. Intractable hemiplegic migraine with status migrainosus  Has frequent migraines but have gotten better since being on topamax and inderal. Only has maybe one a month  5. Mild asthma without complication, unspecified whether persistent  Doe snot need daily inhaler  6. Hypothyroidism, unspecified type  No problems  7. Morbid obesity ([place])  No recent weight changes  8. Recurrent major depressive disorder, in partial remission ([place])  Is on wellbutrin . Has not been taking celexa. Is not doing well- lots of anxiety. Depression screen [facility] 2/9 01/08/2017 07/13/2016 07/04/2016 06/25/2016 03/01/2016  Decreased Interest 1 0 0 0 0  Down, Depressed, Hopeless 1 0 0 0 0  PHQ - 2 Score 2 0 0 0 0  Altered sleeping 2 - - - -  Tired, decreased energy 2 - - - -  Change in appetite 0 - - - -  Feeling bad or failure about yourself  0 - - - -  Trouble concentrating 0 - - - -  Moving slowly or fidgety/restless 0 - - - -  Suicidal thoughts  0 - - - -  PHQ-9 Score 6 - - - -      9. Anxiety state  Only feels stressed when she is at work  10. Diverticulosis of large intestine without hemorrhage  Last flareup was in February when she had to be hospitalized    New complaints: none  Social history: Works at [employer] and has a very stressful boss.   Review of Systems  Constitutional: Negative for activity change and appetite change.  HENT: Negative.   Eyes: Negative for pain.  Respiratory: Negative for shortness of breath.   Cardiovascular: Negative for chest pain, palpitations and leg swelling.  Gastrointestinal: Negative for abdominal pain.  Endocrine: Negative for  polydipsia.  Genitourinary: Negative.   Skin: Negative for rash.  Neurological: Negative for dizziness, weakness and headaches.  Hematological: Does not bruise/bleed easily.  Psychiatric/Behavioral: Negative.   All other systems reviewed and are negative.      Objective:   Physical Exam  Constitutional: She is oriented to person, place, and time. She appears well-developed and well-nourished.  HENT:  Nose: Nose normal.  Mouth/Throat: Oropharynx is clear and moist.  Eyes: EOM are normal.  Neck: Trachea normal, normal range of motion and full passive range of motion without pain. Neck supple. No JVD present. Carotid bruit is not present. No thyromegaly present.  Cardiovascular: Normal rate, regular rhythm, normal heart sounds and intact distal pulses.  Exam reveals no gallop and no friction rub.   No murmur heard. Pulmonary/Chest: Effort normal and breath sounds normal.  Abdominal: Soft. Bowel sounds are normal. She exhibits no distension and no mass. There is no tenderness.  Musculoskeletal: Normal range of motion.  Lymphadenopathy:    She has no cervical adenopathy.  Neurological: She is alert and oriented to person, place, and time. She has normal reflexes.  Skin: Skin is warm and dry.  Psychiatric: She has a normal mood and affect. Her behavior is normal. Judgment and thought content normal.   BP 134/87   Pulse 67   Temp (!) 97.1 F (36.2 C) (Oral)   Ht _0  (1.626 m)   Wt 221 lb (100.2 kg)   BMI 37.93 kg/m   hgba1c 6.4%       Assessment & Plan:  1. Type 2 diabetes mellitus with diabetic polyneuropathy, without long-term current use of insulin (H[facility]) Continue to watch carbs in diet - Bayer DCA Hb A1c Waived - Microalbumin / creatinine urine ratio - sitaGLIPtin-metformin (JANUMET) 50-1000 MG tablet; Take 1 tablet by mouth 2 (two) times daily with a meal.  Dispense: 60 tablet; Refill: 5 - glipiZIDE (GLUCOTROL) 10 MG tablet; TAKE ONE TABLET BY MOUTH EVERY DAY BEFORE  BREAKFAST  Dispense: 30 tablet; Refill: 5  2. Essential hypertension Low sodium diet - CMP14+EGFR - benazepril (LOTENSIN) 20 MG tablet; Take 1 tablet (20 mg total) by mouth daily.  Dispense: 30 tablet; Refill: 5  3. Mixed hyperlipidemia Low fat diet - Lipid panel - atorvastatin (LIPITOR) 40 MG tablet; Take 1 tablet (40 mg total) by mouth daily.  Dispense: 30 tablet; Refill: 0 - fenofibrate 160 MG tablet; Take 1 tablet (160 mg total) by mouth daily.  Dispense: 30 tablet; Refill: 5  4. Mild asthma without complication, unspecified whether persistent Avoid triggers  5. Hypothyroidism, unspecified type  6. Morbid obesity ([place]) Discussed diet and exercise for person with BMI >25 Will recheck weight in 3-6 months  7. Recurrent major depressive disorder, in partial remission [facility]) Stress management Put patient back on celexa -  citalopram (CELEXA) 40 MG tablet; Take 1 tablet (40 mg total) by mouth daily.  Dispense: 30 tablet; Refill: 5  8. Anxiety state - buPROPion (WELLBUTRIN XL) 150 MG 24 hr tablet; Take 3 tablets (450 mg total) by mouth daily.  Dispense: 270 tablet; Refill: 0  9. Diverticulosis of large intestine without hemorrhage - dicyclomine (BENTYL) 20 MG tablet; Take one every 6-8hours for abd cramps  Dispense: 40 tablet; Refill: 0  10. Intractable chronic migraine without aura and with status migrainosus Avoid caffeine - topiramate (TOPAMAX) 50 MG tablet; Take 1 tablet (50 mg total) by mouth 2 (two) times daily.  Dispense: 60 tablet; Refill: 5 - propranolol (INDERAL) 40 MG tablet; Take 1 tablet (40 mg total) by mouth 2 (two) times daily.  Dispense: 60 tablet; Refill: 5    Labs pending Health maintenance reviewed Diet and exercise encouraged Continue all meds Follow up  In 6 months   [place], FNP

## 2017-01-09 LAB — LIPID PANEL
CHOL/HDL RATIO: 5.6 ratio — AB (ref 0.0–4.4)
Cholesterol, Total: 301 mg/dL — ABNORMAL HIGH (ref 100–199)
HDL: 54 mg/dL (ref >39)
LDL CALC: 198 mg/dL — AB (ref 0–99)
Triglycerides: 247 mg/dL — ABNORMAL HIGH (ref 0–149)
VLDL Cholesterol Cal: 49 mg/dL — ABNORMAL HIGH (ref 5–40)

## 2017-01-09 LAB — CMP14+EGFR
A/G RATIO: 1.9 (ref 1.2–2.2)
ALBUMIN: 4.5 g/dL (ref 3.5–5.5)
ALT: 50 IU/L — ABNORMAL HIGH (ref 0–32)
AST: 28 IU/L (ref 0–40)
Alkaline Phosphatase: 46 IU/L (ref 39–117)
BUN / CREAT RATIO: 21 (ref 9–23)
BUN: 27 mg/dL — ABNORMAL HIGH (ref 6–24)
Bilirubin Total: <0.2 mg/dL (ref 0.0–1.2)
CALCIUM: 10.1 mg/dL (ref 8.7–10.2)
CO2: 19 mmol/L — ABNORMAL LOW (ref 20–29)
CREATININE: 1.26 mg/dL — AB (ref 0.57–1.00)
Chloride: 104 mmol/L (ref 96–106)
GFR calc Af Amer: 55 mL/min/{1.73_m2} — ABNORMAL LOW (ref >59)
GFR, EST NON AFRICAN AMERICAN: 48 mL/min/{1.73_m2} — AB (ref >59)
GLOBULIN, TOTAL: 2.4 g/dL (ref 1.5–4.5)
Glucose: 85 mg/dL (ref 65–99)
POTASSIUM: 4.4 mmol/L (ref 3.5–5.2)
SODIUM: 139 mmol/L (ref 134–144)
TOTAL PROTEIN: 6.9 g/dL (ref 6.0–8.5)

## 2017-01-09 LAB — MICROALBUMIN / CREATININE URINE RATIO
CREATININE, UR: 67.7 mg/dL
Microalbumin, Urine: <3 ug/mL

## 2017-01-22 ENCOUNTER — Other Ambulatory Visit: Payer: Self-pay | Admitting: Nurse Practitioner

## 2017-01-22 DIAGNOSIS — E1142 Type 2 diabetes mellitus with diabetic polyneuropathy: Secondary | ICD-10-CM

## 2017-02-05 ENCOUNTER — Other Ambulatory Visit: Payer: Self-pay | Admitting: Nurse Practitioner

## 2017-02-05 DIAGNOSIS — G43711 Chronic migraine without aura, intractable, with status migrainosus: Secondary | ICD-10-CM

## 2017-02-06 ENCOUNTER — Other Ambulatory Visit: Payer: Self-pay | Admitting: Nurse Practitioner

## 2017-02-06 DIAGNOSIS — E782 Mixed hyperlipidemia: Secondary | ICD-10-CM

## 2017-02-09 ENCOUNTER — Other Ambulatory Visit: Payer: Self-pay | Admitting: Nurse Practitioner

## 2017-02-09 DIAGNOSIS — E782 Mixed hyperlipidemia: Secondary | ICD-10-CM

## 2017-02-26 ENCOUNTER — Encounter: Payer: Self-pay | Admitting: Nurse Practitioner

## 2017-02-26 ENCOUNTER — Ambulatory Visit (INDEPENDENT_AMBULATORY_CARE_PROVIDER_SITE_OTHER): Payer: BLUE CROSS/BLUE SHIELD | Admitting: Nurse Practitioner

## 2017-02-26 VITALS — BP 111/71 | HR 57 | Temp 97.3°F | Ht 64.0 in | Wt 219.0 lb

## 2017-02-26 DIAGNOSIS — R251 Tremor, unspecified: Secondary | ICD-10-CM

## 2017-02-26 NOTE — Progress Notes (Signed)
   Subjective:    Patient ID: [name], female    DOB: 12/22/1960, 56 y.o.   MRN: [id number]  HPI Patient in the office with complaints of hands shaking.  Patient noticed the shaking about a month ago when she dropped her fork while eating, but she states other people have been noticing for a long time (i.e. Husband and coworkers).  Patient states there is no real trigger for the shaking, but that it is noticeably worse with stress/anxiety and that it occurs in both hands with the left being worse than the right.  Patient states her husband informed her that her legs twitch frequently at night when she's sleeping.  Patient is diabetic, but does not check her blood sugar.   Review of Systems  Constitutional: Positive for fatigue (x 2 months). Negative for activity change and appetite change.  Endocrine: Positive for polyphagia.  Neurological: Positive for dizziness (occasionally), tremors (bilaterally hands, left worse than right intermittently) and headaches (no migraine, but increased frequency x 1 month). Negative for weakness and light-headedness.  All other systems reviewed and are negative.      Objective:   Physical Exam  Constitutional: She is oriented to person, place, and time. She appears well-developed and well-nourished. No distress.  HENT:  Head: Normocephalic.  Cardiovascular: Normal rate, regular rhythm and normal heart sounds.   Pulmonary/Chest: Effort normal and breath sounds normal.  Musculoskeletal: Normal range of motion. She exhibits no edema, tenderness or deformity.       Right shoulder: She exhibits no pain and normal strength.  Neurological: She is alert and oriented to person, place, and time. She has normal strength and normal reflexes.  Psychiatric: She has a normal mood and affect. Her behavior is normal.   BP 111/71   Pulse (!) 57   Temp (!) 97.3 F (36.3 C) (Oral)   Ht 5\' 4"  (1.626 m)   Wt 219 lb (99.3 kg)   BMI 37.59 kg/m       Assessment & Plan:   1. Tremor of both hands    Orders Placed This Encounter  Procedures  . Ambulatory referral to Neurology    Referral Priority:   Urgent    Referral Type:   Consultation    Referral Reason:   Specialty Services Required    Requested Specialty:   Neurology    Number of Visits Requested:   1   Will wait on neurology report Keep watch of when occurs and keep diary for neurologist  Mary-Margaret Hassell Done, FNP

## 2017-02-26 NOTE — Patient Instructions (Signed)
Tremor A tremor is trembling or shaking that you cannot control. Most tremors affect the hands or arms. Tremors can also affect the head, vocal cords, face, and other parts of the body. There are many types of tremors. Common types include:  Essential tremor. These usually occur in people over the age of 40. It may run in families and can happen in otherwise healthy people.  Resting tremor. These occur when the muscles are at rest, such as when your hands are resting in your lap. People with Parkinson disease often have resting tremors.  Postural tremor. These occur when you try to hold a pose, such as keeping your hands outstretched.  Kinetic tremor. These occur during purposeful movement, such as trying to touch a finger to your nose.  Task-specific tremor. These may occur when you perform tasks such as handwriting, speaking, or standing.  Psychogenic tremor. These dramatically lessen or disappear when you are distracted. They can happen in people of all ages.  Some types of tremors have no known cause. Tremors can also be a symptom of nervous system problems (neurological disorders) that may occur with aging. Some tremors go away with treatment while others do not. Follow these instructions at home: Watch your tremor for any changes. The following actions may help to lessen any discomfort you are feeling:  Take medicines only as directed by your health care provider.  Limit alcohol intake to no more than 1 drink per day for nonpregnant women and 2 drinks per day for men. One drink equals 12 oz of beer, 5 oz of wine, or 1 oz of hard liquor.  Do not use any tobacco products, including cigarettes, chewing tobacco, or electronic cigarettes. If you need help quitting, ask your health care provider.  Avoid extreme heat or cold.  Limit the amount of caffeine you consumeas directed by your health care provider.  Try to get 8 hours of sleep each night.  Find ways to manage your stress,  such as meditation or yoga.  Keep all follow-up visits as directed by your health care provider. This is important.  Contact a health care provider if:  You start having a tremor after starting a new medicine.  You have tremor with other symptoms such as: ? Numbness. ? Tingling. ? Pain. ? Weakness.  Your tremor gets worse.  Your tremor interferes with your day-to-day life. This information is not intended to replace advice given to you by your health care provider. Make sure you discuss any questions you have with your health care provider. Document Released: 05/18/2002 Document Revised: 01/29/2016 Document Reviewed: 11/23/2013 Elsevier Interactive Patient Education  2018 Elsevier Inc.  

## 2017-03-04 ENCOUNTER — Other Ambulatory Visit: Payer: Self-pay | Admitting: *Deleted

## 2017-03-04 DIAGNOSIS — F3341 Major depressive disorder, recurrent, in partial remission: Secondary | ICD-10-CM

## 2017-03-04 MED ORDER — CITALOPRAM HYDROBROMIDE 40 MG PO TABS: 40.0000 mg | ORAL_TABLET | Freq: Every day | ORAL | 0 refills | Status: DC

## 2017-03-05 ENCOUNTER — Encounter: Payer: Self-pay | Admitting: Nurse Practitioner

## 2017-03-05 ENCOUNTER — Ambulatory Visit (INDEPENDENT_AMBULATORY_CARE_PROVIDER_SITE_OTHER): Payer: BLUE CROSS/BLUE SHIELD | Admitting: Nurse Practitioner

## 2017-03-05 VITALS — BP 109/64 | HR 73 | Temp 97.7°F | Ht 64.0 in | Wt 216.0 lb

## 2017-03-05 DIAGNOSIS — G43411 Hemiplegic migraine, intractable, with status migrainosus: Secondary | ICD-10-CM | POA: Diagnosis not present

## 2017-03-05 MED ORDER — KETOROLAC TROMETHAMINE 60 MG/2ML IM SOLN
60.0000 mg | Freq: Once | INTRAMUSCULAR | Status: AC
  Administered 2017-03-05: 60 mg via INTRAMUSCULAR

## 2017-03-05 NOTE — Progress Notes (Signed)
   Subjective:    Patient ID: [name], female    DOB: [birth year], 56 y.o.   MRN: [id number]  HPI Patient in the office with complaint of migraine for the past 3 days.  Headache described as constant, sharp pain across her forehead and in the base of her head.  Patient states the pain is making her nauseous.  She has tried Topamax, Aleve, and DHE which have not improved symptoms.  Pain is worse on the left side.   Review of Systems  Gastrointestinal: Positive for nausea. Negative for vomiting.  Neurological: Positive for headaches (migraine x 3 days). Negative for light-headedness.  All other systems reviewed and are negative.      Objective:   Physical Exam  Constitutional: She is oriented to person, place, and time. She appears well-developed and well-nourished. She appears distressed.  HENT:  Head: Normocephalic.  Cardiovascular: Normal rate, regular rhythm and normal heart sounds.   Pulmonary/Chest: Effort normal and breath sounds normal.  Neurological: She is alert and oriented to person, place, and time. She has normal reflexes. No cranial nerve deficit.  Psychiatric: She has a normal mood and affect. Her behavior is normal.   BP 109/64   Pulse 73   Temp 97.7 F (36.5 C) (Oral)   Ht 5\' 4"  (1.626 m)   Wt 216 lb (98 kg)   BMI 37.08 kg/m     Assessment & Plan:  1. Intractable hemiplegic migraine with status migrainosus Rest in dark room Ice to head if helps RTO prn  - ketorolac (TORADOL) injection 60 mg; Inject 2 mLs (60 mg total) into the muscle once.  Mary Hassell Done, FNP

## 2017-03-14 ENCOUNTER — Encounter: Payer: Self-pay | Admitting: Neurology

## 2017-03-14 ENCOUNTER — Ambulatory Visit (INDEPENDENT_AMBULATORY_CARE_PROVIDER_SITE_OTHER): Payer: BLUE CROSS/BLUE SHIELD | Admitting: Neurology

## 2017-03-14 VITALS — BP 130/65 | HR 65 | Ht 64.0 in | Wt 218.0 lb

## 2017-03-14 DIAGNOSIS — E669 Obesity, unspecified: Secondary | ICD-10-CM | POA: Diagnosis not present

## 2017-03-14 DIAGNOSIS — J4520 Mild intermittent asthma, uncomplicated: Secondary | ICD-10-CM

## 2017-03-14 DIAGNOSIS — G4733 Obstructive sleep apnea (adult) (pediatric): Secondary | ICD-10-CM

## 2017-03-14 DIAGNOSIS — F39 Unspecified mood [affective] disorder: Secondary | ICD-10-CM | POA: Diagnosis not present

## 2017-03-14 DIAGNOSIS — R251 Tremor, unspecified: Secondary | ICD-10-CM | POA: Diagnosis not present

## 2017-03-14 DIAGNOSIS — R351 Nocturia: Secondary | ICD-10-CM

## 2017-03-14 DIAGNOSIS — R51 Headache: Secondary | ICD-10-CM | POA: Diagnosis not present

## 2017-03-14 DIAGNOSIS — R519 Headache, unspecified: Secondary | ICD-10-CM

## 2017-03-14 NOTE — Progress Notes (Signed)
Subjective:    Patient ID: [name] is a 56 y.o. female.  HPI     Star Age, MD, PhD [facility] [address].O. [place], [place] [address]  Dear [name],   I saw your patient, [name], upon your kind request in my neurologic clinic today for initial consultation of her tremors affecting both upper extremities. The patient is accompanied by her husband today. As you know, [name] is a 56 year old left-handed woman with an underlying medical history of depression, anxiety, migraine headaches, hyperlipidemia, diabetes, asthma and obesity, who reports a bilateral upper extremity tremor for the past few weeks, but more so for a few months per husband. She has noticed it with her handwriting and when doing fine motor skills, when feeding herself. Mother was recently diagnosed with PD, age 77.  She had a neck MRI wo contrast and brain MRI wo contrast on 12/06/12, which I reviewed: IMPRESSION: 1.  Stable and normal noncontrast MRI appearance of the brain. 2.  Cervical spine findings are below.   MRI CERVICAL SPINE   IMPRESSION: Overall very mild for age cervical spine degenerative changes. There is a small left paracentral disc protrusion at C4-C5 contributing to mild spinal stenosis and moderate bilateral C5 foraminal stenosis.  Mild if any spinal cord mass effect and no cord signal abnormality. I reviewed your office note from 02/26/2014 as well as 01/08/2017. She has been on propranolol. She also takes Topamax for her headaches. She has been taking her propranolol only once daily. She is written for 40 mg twice a day. She drinks caffeine, 2 servings per day, nonsmoker and does not utilize alcohol. She has no other family history of tremors. Of note, she does not sleep very well. She has nocturia a few times per night and occasional morning headaches. She had a sleep study in the late 90s and had a CPAP machine but  stopped using it. Her equipment is very old. Her husband notices loud snoring and pauses in her breathing. She would be willing to get retested and would be willing to retry CPAP therapy.  Her Past Medical History Is Significant For: Past Medical History:  Diagnosis Date  . Abdominal discomfort    Chronic abdominal and pelvic pain resulting in significant loss of time from work  . Anxiety    with depression  . Asthma   . Chest pain   . Diabetes mellitus   . Drug overdose 2009   60 Naprosyn, psychiatric admission  . Hyperlipidemia    Severe  . Hypertension   . Hypothyroidism   . Migraines     Her Past Surgical History Is Significant For: Past Surgical History:  Procedure Laterality Date  . ABDOMINAL HYSTERECTOMY  02/2007   + lysis of adhesions  . APPENDECTOMY  1990s   ruptured, late 90s  . BILATERAL OOPHORECTOMY      in 2 surgeries prior to 9/08  . COLONOSCOPY  02/2010    normal upper endoscopy, single colonic polyp,  . VENTRAL HERNIA REPAIR     x3    Her Family History Is Significant For: No family history on file.  Her Social History Is Significant For: Social History   Social History  . Marital status: Married    Spouse name: N/A  . Number of children: 0  . Years of education: N/A   Occupational History  . [employer] claims processor    Social History Main Topics  . Smoking status:  Never Smoker  . Smokeless tobacco: Never Used  . Alcohol use No  . Drug use: No  . Sexual activity: Not Currently   Other Topics Concern  . None   Social History Narrative  . None    Her Allergies Are:  Allergies  Allergen Reactions  . Aspirin Anaphylaxis  . Bee Venom Anaphylaxis  . Benadryl [Diphenhydramine Hcl] Anaphylaxis  . Morphine And Related Shortness Of Breath and Itching  . Vicks Formula 44 Cough-Cold Pm [Dm-Apap-Cpm] Shortness Of Breath and Rash    Not anaphylaxis  :   Her Current Medications Are:  Outpatient Encounter Prescriptions as of  03/14/2017  Medication Sig  . albuterol (PROVENTIL HFA;VENTOLIN HFA) 108 (90 Base) MCG/ACT inhaler Inhale 2 puffs into the lungs every 4 (four) hours as needed for wheezing or shortness of breath.  Marland Kitchen atorvastatin (LIPITOR) 40 MG tablet TAKE 1 TABLET BY MOUTH EVERY DAY  . benazepril (LOTENSIN) 20 MG tablet Take 1 tablet (20 mg total) by mouth daily.  Marland Kitchen buPROPion (WELLBUTRIN XL) 150 MG 24 hr tablet Take 3 tablets (450 mg total) by mouth daily.  . citalopram (CELEXA) 40 MG tablet Take 1 tablet (40 mg total) by mouth daily.  Marland Kitchen dicyclomine (BENTYL) 20 MG tablet Take one every 6-8hours for abd cramps  . EPINEPHrine 0.3 mg/0.3 mL IJ SOAJ injection INJECT 0.3 ML (0.3 MG TOTAL) INTO THE SHOULDER, THIGH, OR BUTTOCKS ONCE FOR 1 DOSE.  . fenofibrate 160 MG tablet Take 1 tablet (160 mg total) by mouth daily.  Marland Kitchen glipiZIDE (GLUCOTROL) 10 MG tablet TAKE ONE TABLET BY MOUTH EVERY DAY BEFORE BREAKFAST  . JANUMET 50-1000 MG tablet TAKE 1 TABLET BY MOUTH 2 (TWO) TIMES DAILY WITH A MEAL.  Marland Kitchen ondansetron (ZOFRAN) 4 MG tablet Take 1 tablet (4 mg total) by mouth every 8 (eight) hours as needed for nausea or vomiting.  . propranolol (INDERAL) 40 MG tablet Take 1 tablet (40 mg total) by mouth 2 (two) times daily.  Marland Kitchen topiramate (TOPAMAX) 50 MG tablet Take 1 tablet (50 mg total) by mouth 2 (two) times daily.   No facility-administered encounter medications on file as of 03/14/2017.   : Review of Systems:  Out of a complete 14 point review of systems, all are reviewed and negative with the exception of these symptoms as listed below: Review of Systems  Neurological:       Pt presents today to discuss her tremor. Pt notices her tremor in her hands when she is eating. Pt's family has noticed the tremors longer than she has. Pt reports tremors in her arms and legs. Pt is left handed.    Objective:  Neurological Exam  Physical Exam Physical Examination:   Vitals:   03/14/17 1007  BP: 130/65  Pulse: 65    General  Examination: The patient is a very pleasant 56 y.o. female in no acute distress. She appears well-developed and well-nourished and well groomed.   HEENT: Normocephalic, atraumatic, pupils are equal, round and reactive to light and accommodation. Corrective eyeglasses in place. Extraocular tracking is good without limitation to gaze excursion or nystagmus noted. Normal smooth pursuit is noted. Hearing is grossly intact. Tympanic membranes are clear bilaterally. Face is symmetric with normal facial animation and normal facial sensation. Speech is clear with no dysarthria noted. There is no hypophonia. There is no lip, neck/head, jaw or voice tremor. Neck is supple with full range of passive and active motion. There are no carotid bruits on auscultation. Oropharynx exam reveals: mild  mouth dryness, adequate dental hygiene and moderate airway crowding, due to smaller airway opening. Mallampati is class II. Tongue protrudes centrally and palate elevates symmetrically. Tonsils are small, about 1+ in size. Neck size is 18.25 inches. She has a Mild overbite.   Chest: Clear to auscultation without wheezing, rhonchi or crackles noted.  Heart: S1+S2+0, regular and normal without murmurs, rubs or gallops noted.   Abdomen: Soft, non-tender and non-distended with normal bowel sounds appreciated on auscultation.  Extremities: There is no pitting edema in the distal lower extremities bilaterally. Pedal pulses are intact.  Skin: Warm and dry without trophic changes noted. There are no varicose veins.  Musculoskeletal: exam reveals no obvious joint deformities, tenderness or joint swelling or erythema.   Neurologically:  Mental status: The patient is awake, alert and oriented in all 4 spheres. Her immediate and remote memory, attention, language skills and fund of knowledge are appropriate. There is no evidence of aphasia, agnosia, apraxia or anomia. Speech is clear with normal prosody and enunciation. Thought  process is linear. Mood is normal and affect is normal.  Cranial nerves II - XII are as described above under HEENT exam. In addition: shoulder shrug is normal with equal shoulder height noted. Motor exam: Normal bulk, strength and tone is noted. There is no drift. She has no resting tremor. She has a minimal right upper extremity and mild left upper extremity postural and action tremor.  On 03/14/2017: Archimedes spiral drawing: she has mild trembling with the left hand and mild to moderate trembling with the right, handwriting is no particularly tremulous, legible and not micrographic.  Romberg is negative. Reflexes are 2+ throughout. Babinski: Toes are flexor bilaterally. Fine motor skills and coordination: intact with normal finger taps, normal hand movements, normal rapid alternating patting, normal foot taps and normal foot agility.  Cerebellar testing: No dysmetria or intention tremor on finger to nose testing. Heel to shin is unremarkable bilaterally. There is no truncal or gait ataxia.  Sensory exam: intact to light touch,vibration, temperature sense in the upper and lower extremities.  Gait, station and balance: She stands easily. No veering to one side is noted. No leaning to one side is noted. Posture is age-appropriate and stance is narrow based. Gait shows normal stride length and normal pace. No problems turning are noted. Tandem walk is unremarkable.   Assessment and Plan:  In summary, [name] is a very pleasant 56 y.o.-year old female with an underlying medical history of depression, anxiety, migraine headaches, hyperlipidemia, diabetes, asthma and obesity, who presents for neurologic consultation of her bilateral upper extremity tremors. On examination she has a mild upper extremity tremor. No signs of parkinsonism. She is reassured in that regard. She may have a mild form of essential tremor. She is advised to continue with propranolol but increase it as instructed  previously to 40 mg twice a day, second dose around lunchtime. In addition, she has a long-standing history of sleep apnea and is currently not treated for this. Sleep deprivation, poor sleep consolidation and other tremor triggers are discussed with her today. She is willing to get retested for sleep apnea and consider treatment with a CPAP machine. To that end, I ordered a sleep study for her today. She had a brain MRI in 2014 which was unremarkable for age at the time. She has an otherwise nonfocal exam and repeat imaging is not currently warranted.  I suggested follow-up after sleep study testing is completed. I answered all their questions today and  the patient and her husband were in agreement.  Thank you very much for allowing me to participate in the care of this nice patient. If I can be of any further assistance to you please do not hesitate to call me at [phone].  Sincerely,   Star Age, MD, PhD

## 2017-03-14 NOTE — Patient Instructions (Addendum)
You have a mild tremor.    You had a reassuring MRI brain in 2014. I would not suggest repeating this as yet.  I do not see any signs or symptoms of parkinson's like disease or what we call parkinsonism.   You do have a mild appearing tremor affecting both hands. For this, I would recommend that you take your propranolol as orinally suggested at 1 pill 2 times a day.   Remember to drink plenty of fluid at least 6 glasses (8 oz each), eat healthy meals and do not skip any meals. Try to eat protein with a every meal and eat a healthy snack such as fruit or nuts in between meals. Try to keep a regular sleep-wake schedule and try to exercise daily, particularly in the form of walking, 20-30 minutes a day, if you can.    Please remember, that any kind of tremor may be exacerbated by anxiety, anger, nervousness, excitement, dehydration, sleep deprivation, by caffeine, and low blood sugar values or blood sugar fluctuations. Some medications, especially some antidepressants and lithium can cause or exacerbate tremors. Tremors may temporarily calm down or subside with the use of a benzodiazepine such as Valium or related medications and with alcohol. Be aware, however, that drinking alcohol is not an approved or appropriate treatment for tremor control and long-term use of benzodiazepines such as Valium, lorazepam, alprazolam, or clonazepam can cause habit formation, physical and psychological addiction. There are very few medications that symptomatically help with tremor reduction, none are without potential side effects. Your propranolol will likely help.   For your sleep apnea, we will do retesting and try to get you another CPAP.

## 2017-04-07 ENCOUNTER — Ambulatory Visit (INDEPENDENT_AMBULATORY_CARE_PROVIDER_SITE_OTHER): Payer: BLUE CROSS/BLUE SHIELD | Admitting: Neurology

## 2017-04-07 DIAGNOSIS — R351 Nocturia: Secondary | ICD-10-CM

## 2017-04-07 DIAGNOSIS — E669 Obesity, unspecified: Secondary | ICD-10-CM

## 2017-04-07 DIAGNOSIS — R519 Headache, unspecified: Secondary | ICD-10-CM

## 2017-04-07 DIAGNOSIS — G4733 Obstructive sleep apnea (adult) (pediatric): Secondary | ICD-10-CM | POA: Diagnosis not present

## 2017-04-07 DIAGNOSIS — R51 Headache: Secondary | ICD-10-CM

## 2017-04-07 DIAGNOSIS — F39 Unspecified mood [affective] disorder: Secondary | ICD-10-CM

## 2017-04-07 DIAGNOSIS — G4720 Circadian rhythm sleep disorder, unspecified type: Secondary | ICD-10-CM

## 2017-04-07 DIAGNOSIS — R251 Tremor, unspecified: Secondary | ICD-10-CM

## 2017-04-08 ENCOUNTER — Telehealth: Payer: Self-pay

## 2017-04-08 NOTE — Progress Notes (Signed)
Patient referred by , seen by me on Mary-Margaret Hassell Done, NP, diagnostic PSG on 04/07/17.    Please call and notify the patient that the recent sleep study did confirm the diagnosis of mild to moderate obstructive sleep apnea with a total AHI of 11.9/hour, REM AHI of 23.3/hour, supine AHI of 12.8/hour and O2 nadir of 85%. I recommend treatment for this in the form of CPAP. This will require a repeat sleep study for proper titration and mask fitting. Please explain to patient and arrange for a CPAP titration study. I have placed an order in the chart. Thanks, and please route to [facility] for scheduling next sleep study.  Star Age, MD, PhD [facility])

## 2017-04-08 NOTE — Telephone Encounter (Signed)
I called pt. I advised pt that Dr. Rexene Alberts reviewed their sleep study results and found that pt does have mild to moderate osa and recommends that pt be treated with a cpap. Dr. Rexene Alberts recommends that pt return for a repeat sleep study in order to properly titrate the cpap and ensure a good mask fit. Pt is agreeable to returning for a titration study. I advised pt that our sleep lab will file with pt's insurance and call pt to schedule the sleep study when we hear back from the pt's insurance regarding coverage of this sleep study. Pt verbalized understanding of results. Pt had no questions at this time but was encouraged to call back if questions arise.

## 2017-04-08 NOTE — Telephone Encounter (Signed)
I called pt to discuss her sleep study results. No answer, left a message asking her to call me back. 

## 2017-04-08 NOTE — Telephone Encounter (Signed)
-----   Message from Star Age, MD sent at 04/08/2017  8:48 AM EDT ----- Patient referred by , seen by me on Mary-Margaret Hassell Done, NP, diagnostic PSG on 04/07/17.    Please call and notify the patient that the recent sleep study did confirm the diagnosis of mild to moderate obstructive sleep apnea with a total AHI of 11.9/hour, REM AHI of 23.3/hour, supine AHI of 12.8/hour and O2 nadir of 85%. I recommend treatment for this in the form of CPAP. This will require a repeat sleep study for proper titration and mask fitting. Please explain to patient and arrange for a CPAP titration study. I have placed an order in the chart. Thanks, and please route to [facility] for scheduling next sleep study.  Star Age, MD, PhD [facility])

## 2017-04-08 NOTE — Telephone Encounter (Signed)
Patient is returning your call.  

## 2017-04-08 NOTE — Telephone Encounter (Signed)
I called pt again to discuss. No answer, left a message asking her to call me back. 

## 2017-04-08 NOTE — Addendum Note (Signed)
Addended by: Star Age on: 04/08/2017 08:48 AM   Modules accepted: Orders

## 2017-04-08 NOTE — Procedures (Signed)
PATIENT'S NAME:  [name], [name] DOB:      March 14, 1961      MR#:    [id number]     DATE OF RECORDING: 04/07/2017 REFERRING M.D.:  Chevis Pretty, FNP Study Performed:   Baseline Polysomnogram HISTORY: 56 year old woman with a history of depression, anxiety, migraine headaches, hyperlipidemia, diabetes, asthma and obesity, who reports snoring and sleep disruption, nocturia. Her husband notices pauses in her breathing. The patient's weight 218 pounds with a height of 64 (inches), resulting in a BMI of 37.3 kg/m2.  The patient's neck circumference measured 18.2 inches.   CURRENT MEDICATIONS: Ventolin, Lipitor, Lotensin, Wellbutrin, Celexa, Bentyl, Epipen, Fenofibrate, Glucotrol, Janumet, Zofran, Inderal, Topamax   PROCEDURE:  This is a multichannel digital polysomnogram utilizing the Somnostar 11.2 system.  Electrodes and sensors were applied and monitored per AASM Specifications.   EEG, EOG, Chin and Limb EMG, were sampled at 200 Hz.  ECG, Snore and Nasal Pressure, Thermal Airflow, Respiratory Effort, CPAP Flow and Pressure, Oximetry was sampled at 50 Hz. Digital video and audio were recorded.      BASELINE STUDY  Lights Out was at 20:55 and Lights On at 05:12.  Total recording time (TRT) was 497 minutes, with a total sleep time (TST) of  439.5 minutes.  The patient's sleep latency was 31.5 minutes, which is delayed.  REM latency was 110.5 minutes, which is borderline high. The sleep efficiency was 88.4 %.     SLEEP ARCHITECTURE: WASO (Wake after sleep onset) was 54.5 minutes with moderate sleep fragmentation noted. There were 74.5 minutes in Stage N1, 230.5 minutes Stage N2, 18.5 minutes Stage N3 and 116 minutes in Stage REM. The percentage of Stage N1 was 17.%, which is markedly increased, Stage N2 was 52.4%, which is normal, Stage N3 was 4.2% and Stage R (REM sleep) was 26.4%, which is borderline increased. The arousals were noted as: 14 were spontaneous, 1 were associated with PLMs, 17  were associated with respiratory events.    Audio and video analysis did not show any abnormal or unusual movements, behaviors, phonations or vocalizations. The patient took 2 bathroom breaks. Mild to moderate snoring was noted. The EKG was in keeping with normal sinus rhythm (NSR).  RESPIRATORY ANALYSIS:  There were a total of 87 respiratory events:  0 obstructive apneas, 0 central apneas and 0 mixed apneas with a total of 0 apneas and an apnea index (AI) of 0 /hour. There were 87 hypopneas with a hypopnea index of 11.9 /hour. The patient also had 0 respiratory event related arousals (RERAs).      The total APNEA/HYPOPNEA INDEX (AHI) was 11.9/hour and the total RESPIRATORY DISTURBANCE INDEX was 11.9 /hour.  45 events occurred in REM sleep and 84 events in NREM. The REM AHI was 23.3 /hour, versus a non-REM AHI of 7.8. The patient spent 360.5 minutes of total sleep time in the supine position and 79 minutes in non-supine.. The supine AHI was 12.8 versus a non-supine AHI of 7.6.  OXYGEN SATURATION & C02: The Wake baseline 02 saturation was 98%, with the lowest being 85%. Time spent below 89% saturation equaled 7 minutes.  PERIODIC LIMB MOVEMENTS: The patient had a total of 5 Periodic Limb Movements.  The Periodic Limb Movement (PLM) index was .7 and the PLM Arousal index was .1/hour.  Post-study, the patient indicated that sleep was better than usual.   IMPRESSION:  1. Obstructive Sleep Apnea (OSA) 2. Dysfunctions associated with sleep stages or arousal from sleep  RECOMMENDATIONS:  1. This study  demonstrates overall mild obstructive sleep apnea, moderate in REM sleep with a total AHI of 11.9/hour, REM AHI of 23.3/hour, supine AHI of 12.8/hour and O2 nadir of 85%. Given the patient's medical history and sleep related complaints, a full-night CPAP titration study is recommended to optimize therapy. Other treatment options may include avoidance of supine sleep position along with weight loss, upper  airway or jaw surgery in selected patients or the use of an oral appliance in certain patients. ENT evaluation and/or consultation with a maxillofacial surgeon or dentist may be feasible in some instances.    2. Please note that untreated obstructive sleep apnea carries additional perioperative morbidity. Patients with significant obstructive sleep apnea should receive perioperative PAP therapy and the surgeons and particularly the anesthesiologist should be informed of the diagnosis and the severity of the sleep disordered breathing. 3. This study shows sleep fragmentation and abnormal sleep stage percentages; these are nonspecific findings and per se do not signify an intrinsic sleep disorder or a cause for the patient's sleep-related symptoms. Causes include (but are not limited to) the first night effect of the sleep study, circadian rhythm disturbances, medication effect or an underlying mood disorder or medical problem.  4. The patient should be cautioned not to drive, work at heights, or operate dangerous or heavy equipment when tired or sleepy. Review and reiteration of good sleep hygiene measures should be pursued with any patient. 5. The patient will be seen in follow-up by Dr. Rexene Alberts at [facility] for discussion of the test results and further management strategies. The referring provider will be notified of the test results.  I certify that I have reviewed the entire raw data recording prior to the issuance of this report in accordance with the Standards of Accreditation of the American Academy of Sleep Medicine (AASM)    Star Age, MD, PhD Diplomat, American Board of Psychiatry and Neurology (Neurology and Sleep Medicine)

## 2017-04-15 ENCOUNTER — Encounter: Payer: Self-pay | Admitting: Family Medicine

## 2017-04-15 ENCOUNTER — Ambulatory Visit (INDEPENDENT_AMBULATORY_CARE_PROVIDER_SITE_OTHER): Payer: BLUE CROSS/BLUE SHIELD | Admitting: Family Medicine

## 2017-04-15 DIAGNOSIS — R1084 Generalized abdominal pain: Secondary | ICD-10-CM | POA: Diagnosis not present

## 2017-04-15 NOTE — Progress Notes (Signed)
Subjective:  Patient ID: [name], female    DOB: 07-13-1960  Age: 56 y.o. MRN: [id number]  CC: Abdominal Pain (pt here today c/o stomach pain )   HPI RYLEN HOU presents for 2 weeks of increasing abdominal discomfort.  It is adjacent to the mesh that was placed in her stomach over a decade ago.  It is a moderate pain there is some nausea.  No vomiting.  She does have frequent stools at times.  She is passed no blood.  She has had multiple abdominal surgeries.  This includes some gynecologic procedures as well.  The mesh was originally placed for ventral hernia.  Depression screen [facility] 2/9 04/15/2017 03/05/2017 02/26/2017  Decreased Interest 0 0 0  Down, Depressed, Hopeless 0 0 0  PHQ - 2 Score 0 0 0  Altered sleeping - - -  Tired, decreased energy - - -  Change in appetite - - -  Feeling bad or failure about yourself  - - -  Trouble concentrating - - -  Moving slowly or fidgety/restless - - -  Suicidal thoughts - - -  PHQ-9 Score - - -    History Skiler has a past medical history of Abdominal discomfort, Anxiety, Asthma, Chest pain, Diabetes mellitus, Drug overdose (2009), Hyperlipidemia, Hypertension, Hypothyroidism, and Migraines.   She has a past surgical history that includes Abdominal hysterectomy (02/2007); Bilateral oophorectomy; Ventral hernia repair; Appendectomy (1990s); and Colonoscopy (02/2010).   Her family history is not on file.She reports that  has never smoked. she has never used smokeless tobacco. She reports that she does not drink alcohol or use drugs.    ROS Review of Systems  Constitutional: Negative for activity change, appetite change and fever.  HENT: Negative for congestion, rhinorrhea and sore throat.   Eyes: Negative for visual disturbance.  Respiratory: Negative for cough and shortness of breath.   Cardiovascular: Negative for chest pain and palpitations.  Gastrointestinal: Positive for abdominal pain (p periumbilical). Negative  for diarrhea and nausea.  Genitourinary: Negative for dysuria.  Musculoskeletal: Negative for arthralgias and myalgias.    Objective:  BP 112/63   Pulse (!) 53   Temp (!) 97.3 F (36.3 C) (Oral)   Ht 5\' 4"  (1.626 m)   Wt 218 lb (98.9 kg)   BMI 37.42 kg/m   BP Readings from Last 3 Encounters:  04/15/17 112/63  03/14/17 130/65  03/05/17 109/64    Wt Readings from Last 3 Encounters:  04/15/17 218 lb (98.9 kg)  03/14/17 218 lb (98.9 kg)  03/05/17 216 lb (98 kg)     Physical Exam  Constitutional: She is oriented to person, place, and time. She appears well-developed and well-nourished.  HENT:  Head: Normocephalic and atraumatic.  Cardiovascular: Normal rate and regular rhythm.  No murmur heard. Pulmonary/Chest: Effort normal and breath sounds normal.  Abdominal: Soft. Bowel sounds are normal. She exhibits distension. She exhibits no mass. There is tenderness. There is no rebound and no guarding.  Neurological: She is alert and oriented to person, place, and time.  Skin: Skin is warm and dry.  Psychiatric: She has a normal mood and affect. Her behavior is normal.      Assessment & Plan:   Yukiko was seen today for abdominal pain.  Diagnoses and all orders for this visit:  Generalized abdominal pain -     CT ABDOMEN PELVIS W WO CONTRAST; Future -     Ambulatory referral to General Surgery  I am having Mills Koller. Janssens maintain her albuterol, ondansetron, EPINEPHrine, benazepril, topiramate, propranolol, fenofibrate, glipiZIDE, dicyclomine, buPROPion, JANUMET, atorvastatin, and citalopram.  Allergies as of 04/15/2017      Reactions   Aspirin Anaphylaxis   Bee Venom Anaphylaxis   Benadryl [diphenhydramine Hcl] Anaphylaxis   Morphine And Related Shortness Of Breath, Itching   Vicks Formula 44 Cough-cold Pm [dm-apap-cpm] Shortness Of Breath, Rash   Not anaphylaxis      Medication List        Accurate as of 04/15/17  7:42 PM. Always use your most  recent med list.          albuterol 108 (90 Base) MCG/ACT inhaler Commonly known as:  PROVENTIL HFA;VENTOLIN HFA Inhale 2 puffs into the lungs every 4 (four) hours as needed for wheezing or shortness of breath.   atorvastatin 40 MG tablet Commonly known as:  LIPITOR TAKE 1 TABLET BY MOUTH EVERY DAY   benazepril 20 MG tablet Commonly known as:  LOTENSIN Take 1 tablet (20 mg total) by mouth daily.   buPROPion 150 MG 24 hr tablet Commonly known as:  WELLBUTRIN XL Take 3 tablets (450 mg total) by mouth daily.   citalopram 40 MG tablet Commonly known as:  CELEXA Take 1 tablet (40 mg total) by mouth daily.   dicyclomine 20 MG tablet Commonly known as:  BENTYL Take one every 6-8hours for abd cramps   EPINEPHrine 0.3 mg/0.3 mL Soaj injection Commonly known as:  EPI-PEN INJECT 0.3 ML (0.3 MG TOTAL) INTO THE SHOULDER, THIGH, OR BUTTOCKS ONCE FOR 1 DOSE.   fenofibrate 160 MG tablet Take 1 tablet (160 mg total) by mouth daily.   glipiZIDE 10 MG tablet Commonly known as:  GLUCOTROL TAKE ONE TABLET BY MOUTH EVERY DAY BEFORE BREAKFAST   JANUMET 50-1000 MG tablet Generic drug:  sitaGLIPtin-metformin TAKE 1 TABLET BY MOUTH 2 (TWO) TIMES DAILY WITH A MEAL.   ondansetron 4 MG tablet Commonly known as:  ZOFRAN Take 1 tablet (4 mg total) by mouth every 8 (eight) hours as needed for nausea or vomiting.   propranolol 40 MG tablet Commonly known as:  INDERAL Take 1 tablet (40 mg total) by mouth 2 (two) times daily.   topiramate 50 MG tablet Commonly known as:  TOPAMAX Take 1 tablet (50 mg total) by mouth 2 (two) times daily.        Follow-up: Return if symptoms worsen or fail to improve.  Claretta Fraise, M.D.

## 2017-04-26 ENCOUNTER — Telehealth: Payer: Self-pay

## 2017-04-26 NOTE — Telephone Encounter (Signed)
Spoke with patient and told her that s[pecialist may be able to get ct approved

## 2017-04-26 NOTE — Telephone Encounter (Signed)
Patient calling that she has a surgeon appt but hasnt gotten CT yet and she doesn't need to go to surgeon if no CT scan  CT scan was denied so patient needs advise

## 2017-05-01 ENCOUNTER — Other Ambulatory Visit: Payer: Self-pay | Admitting: Nurse Practitioner

## 2017-05-01 DIAGNOSIS — F411 Generalized anxiety disorder: Secondary | ICD-10-CM

## 2017-05-12 ENCOUNTER — Ambulatory Visit (INDEPENDENT_AMBULATORY_CARE_PROVIDER_SITE_OTHER): Payer: BLUE CROSS/BLUE SHIELD | Admitting: Neurology

## 2017-05-12 DIAGNOSIS — R251 Tremor, unspecified: Secondary | ICD-10-CM

## 2017-05-12 DIAGNOSIS — E669 Obesity, unspecified: Secondary | ICD-10-CM

## 2017-05-12 DIAGNOSIS — R351 Nocturia: Secondary | ICD-10-CM

## 2017-05-12 DIAGNOSIS — R51 Headache: Secondary | ICD-10-CM

## 2017-05-12 DIAGNOSIS — F39 Unspecified mood [affective] disorder: Secondary | ICD-10-CM

## 2017-05-12 DIAGNOSIS — G4720 Circadian rhythm sleep disorder, unspecified type: Secondary | ICD-10-CM

## 2017-05-12 DIAGNOSIS — G4733 Obstructive sleep apnea (adult) (pediatric): Secondary | ICD-10-CM

## 2017-05-12 DIAGNOSIS — R519 Headache, unspecified: Secondary | ICD-10-CM

## 2017-05-13 ENCOUNTER — Other Ambulatory Visit: Payer: Self-pay | Admitting: Neurology

## 2017-05-13 ENCOUNTER — Other Ambulatory Visit: Payer: Self-pay | Admitting: Surgery

## 2017-05-13 DIAGNOSIS — R109 Unspecified abdominal pain: Secondary | ICD-10-CM

## 2017-05-13 DIAGNOSIS — G4733 Obstructive sleep apnea (adult) (pediatric): Secondary | ICD-10-CM

## 2017-05-13 NOTE — Progress Notes (Signed)
Patient referred by Chevis Pretty, NP, seen by me on 03/14/17, diagnostic PSG on 04/07/17, CPAP study on 05/12/17.     Please call and inform patient that I have entered an order for treatment with positive airway pressure (PAP) treatment of obstructive sleep apnea (OSA). She did well during the latest sleep study with CPAP. We will, therefore, arrange for a machine for home use through a DME (durable medical equipment) company of Her choice; and I will see the patient back in follow-up in about 10 weeks. Please also explain to the patient that I will be looking out for compliance data, which can be downloaded from the machine (stored on an SD card, that is inserted in the machine) or via remote access through a modem, that is built into the machine. At the time of the followup appointment we will discuss sleep study results and how it is going with PAP treatment at home. Please advise patient to bring Her machine at the time of the first FU visit, even though this is cumbersome. Bringing the machine for every visit after that will likely not be needed, but often helps for the first visit to troubleshoot if needed. Please re-enforce the importance of compliance with treatment and the need for [place] to monitor compliance data - often an insurance requirement and actually good feedback for the patient as far as how they are doing.  Also remind patient, that any interim PAP machine or mask issues should be first addressed with the DME company, as they can often help better with technical and mask fit issues. Please ask if patient has a preference regarding DME company.  Please also make sure, the patient has a follow-up appointment with me in about 10 weeks from the setup date, thanks.  Once you have spoken to the patient - and faxed/routed report to PCP and referring MD (if other than PCP), you can close this encounter, thanks,   Star Age, MD, PhD [facility] ([place])

## 2017-05-13 NOTE — Procedures (Signed)
PATIENT'S NAME:  [name], [name] DOB:      [birth year]      MR#:    [id number]     DATE OF RECORDING: 05/12/2017 REFERRING M.D.:  Chevis Pretty, FNP Study Performed:   CPAP  Titration HISTORY: 56 year old woman with a history of depression, anxiety, migraine headaches, hyperlipidemia, diabetes, asthma and obesity, who presents for a full night CPAP titration. Her diagnostic PSG on 04/07/17 showed an AHI of 11.9/hour, REM AHI of 23.3/hour, supine AHI of 12.8/hour and O2 nadir of 85%. The patient's weight 218 pounds with a height of 64 (inches), resulting in a BMI of 37.3 kg/m2. The patient's neck circumference measured 18.3 inches.  CURRENT MEDICATIONS: Ventolin, Lipitor, Lotensin, Wellbutrin, Celexa, Bentyl, Epipen, Fenofibrate, Glucotrol, Janumet, Zofran, Inderal, Topamax  PROCEDURE:  This is a multichannel digital polysomnogram utilizing the SomnoStar 11.2 system.  Electrodes and sensors were applied and monitored per AASM Specifications.   EEG, EOG, Chin and Limb EMG, were sampled at 200 Hz.  ECG, Snore and Nasal Pressure, Thermal Airflow, Respiratory Effort, CPAP Flow and Pressure, Oximetry was sampled at 50 Hz. Digital video and audio were recorded.      The patient was fitted with extra-small nasal pillows. CPAP was initiated at 5 cmH20 with heated humidity per AASM standards and pressure was advanced to 9 cmH20 because of hypopneas, apneas and desaturations.  At a PAP pressure of 8 cmH20, there was a reduction of the AHI to 0/hour, with supine REM sleep achieved and O2 nadir of 95%.    Lights Out was at 22:13 and Lights On at 05:01. Total recording time (TRT) was 408.5 minutes, with a total sleep time (TST) of 316.5 minutes. The patient's sleep latency was 69 minutes, which is delayed and REM latency was 150 minutes, which is delayed. The sleep efficiency was 77.5 %.    SLEEP ARCHITECTURE: WASO (Wake after sleep onset) was 80 minutes with mild to moderate sleep fragmentation noted.   There were 32 minutes in Stage N1, 132 minutes Stage N2, 32.5 minutes Stage N3 and 120 minutes in Stage REM.  The percentage of Stage N1 was 10.1%, which is increased, Stage N2 was 41.7%, Stage N3 was 10.3% and Stage R (REM sleep) was 37.9%, which is increased and in keeping with rebound. The arousals were noted as: 4 were spontaneous, 0 were associated with PLMs, 0 were associated with respiratory events.  Audio and video analysis did not show any abnormal or unusual movements, behaviors, phonations or vocalizations. The patient took 2 bathroom breaks. The EKG was in keeping with normal sinus rhythm (NSR).  RESPIRATORY ANALYSIS:  There was a total of 0 respiratory events: 0 obstructive apneas, 0 central apneas and 0 mixed apneas with a total of 0 apneas and an apnea index (AI) of 0 /hour. There were 0 hypopneas with a hypopnea index of 0/hour. The patient also had 0 respiratory event related arousals (RERAs).      The total APNEA/HYPOPNEA INDEX  (AHI) was 0 /hour and the total RESPIRATORY DISTURBANCE INDEX was 0 .hour  0 events occurred in REM sleep and 0 events in NREM. The REM AHI was 0 /hour versus a non-REM AHI of 0 /hour.  The patient spent 316.5 minutes of total sleep time in the supine position and 0 minutes in non-supine. The supine AHI was 0.0, versus a non-supine AHI of 0.0.  OXYGEN SATURATION & C02:  The baseline 02 saturation was 96%, with the lowest being 94%. Time spent below 89% saturation equaled  0 minutes.  PERIODIC LIMB MOVEMENTS: The patient had a total of 0 Periodic Limb Movements. The Periodic Limb Movement (PLM) index was 0 and the PLM Arousal index was 0 /hour.  Post-study, the patient indicated that sleep was better than usual.   IMPRESSION:   1. Obstructive Sleep Apnea (OSA) 2. Dysfunctions associated with sleep stages or arousal from sleep   RECOMMENDATIONS:   1. This study demonstrates resolution of the patient's obstructive sleep apnea with CPAP therapy. I will,  therefore, start the patient on home CPAP treatment at a pressure of 8 cm via XS nasal pillows with heated humidity. The patient should be reminded to be fully compliant with PAP therapy to improve sleep related symptoms and decrease long term cardiovascular risks. The patient should be reminded, that it may take up to 3 months to get fully used to using PAP with all planned sleep. The earlier full compliance is achieved, the better long term compliance tends to be. Please note that untreated obstructive sleep apnea carries additional perioperative morbidity. Patients with significant obstructive sleep apnea should receive perioperative PAP therapy and the surgeons and particularly the anesthesiologist should be informed of the diagnosis and the severity of the sleep disordered breathing. 2. This study shows sleep fragmentation and abnormal sleep stage percentages; these are nonspecific findings and per se do not signify an intrinsic sleep disorder or a cause for the patient's sleep-related symptoms. Causes include (but are not limited to) the first night effect of the sleep study, circadian rhythm disturbances, medication effect or an underlying mood disorder or medical problem.  3. The patient should be cautioned not to drive, work at heights, or operate dangerous or heavy equipment when tired or sleepy. Review and reiteration of good sleep hygiene measures should be pursued with any patient. 4. The patient will be seen in follow-up by Dr. Rexene Alberts at [facility] for discussion of the test results and further management strategies. The referring provider will be notified of the test results.   I certify that I have reviewed the entire raw data recording prior to the issuance of this report in accordance with the Standards of Accreditation of the American Academy of Sleep Medicine (AASM)     Star Age, MD, PhD Diplomat, American Board of Psychiatry and Neurology (Neurology and Sleep Medicine)

## 2017-05-15 ENCOUNTER — Telehealth: Payer: Self-pay

## 2017-05-15 NOTE — Telephone Encounter (Signed)
I called pt. I advised pt that Dr. Rexene Alberts reviewed their sleep study results and found that pt did well with the cpap during the latest sleep study. Dr. Rexene Alberts recommends that pt start a cpap at home. I reviewed PAP compliance expectations with the pt. Pt is agreeable to starting a CPAP. I advised pt that an order will be sent to a DME, [facility], and [facility] will call the pt within about one week after they file with the pt's insurance. [facility] will show the pt how to use the machine, fit for masks, and troubleshoot the CPAP if needed. A follow up appt was made for insurance purposes with Dr. Rexene Alberts on 07/31/17 at 3:00pm . Pt verbalized understanding to arrive 15 minutes early and bring their CPAP. A letter with all of this information in it will be mailed to the pt as a reminder. I verified with the pt that the address we have on file is correct. Pt verbalized understanding of results. Pt had no questions at this time but was encouraged to call back if questions arise.

## 2017-05-15 NOTE — Telephone Encounter (Signed)
-----   Message from Star Age, MD sent at 05/13/2017  6:09 PM EST ----- Patient referred by Chevis Pretty, NP, seen by me on 03/14/17, diagnostic PSG on 04/07/17, CPAP study on 05/12/17.     Please call and inform patient that I have entered an order for treatment with positive airway pressure (PAP) treatment of obstructive sleep apnea (OSA). She did well during the latest sleep study with CPAP. We will, therefore, arrange for a machine for home use through a DME (durable medical equipment) company of Her choice; and I will see the patient back in follow-up in about 10 weeks. Please also explain to the patient that I will be looking out for compliance data, which can be downloaded from the machine (stored on an SD card, that is inserted in the machine) or via remote access through a modem, that is built into the machine. At the time of the followup appointment we will discuss sleep study results and how it is going with PAP treatment at home. Please advise patient to bring Her machine at the time of the first FU visit, even though this is cumbersome. Bringing the machine for every visit after that will likely not be needed, but often helps for the first visit to troubleshoot if needed. Please re-enforce the importance of compliance with treatment and the need for [place] to monitor compliance data - often an insurance requirement and actually good feedback for the patient as far as how they are doing.  Also remind patient, that any interim PAP machine or mask issues should be first addressed with the DME company, as they can often help better with technical and mask fit issues. Please ask if patient has a preference regarding DME company.  Please also make sure, the patient has a follow-up appointment with me in about 10 weeks from the setup date, thanks.  Once you have spoken to the patient - and faxed/routed report to PCP and referring MD (if other than PCP), you can close this encounter, thanks,   Star Age, MD, PhD [facility] ([place])

## 2017-05-17 ENCOUNTER — Ambulatory Visit
Admission: RE | Admit: 2017-05-17 | Discharge: 2017-05-17 | Disposition: A | Discharge status: Home / Self Care | Payer: BLUE CROSS/BLUE SHIELD | Source: Ambulatory Visit | Attending: Surgery | Admitting: Surgery

## 2017-05-17 DIAGNOSIS — R109 Unspecified abdominal pain: Secondary | ICD-10-CM

## 2017-05-17 MED ORDER — IOPAMIDOL (ISOVUE-300) INJECTION 61%
115.0000 mL | Freq: Once | INTRAVENOUS | Status: AC | PRN
  Administered 2017-05-17: 115 mL via INTRAVENOUS

## 2017-05-21 ENCOUNTER — Other Ambulatory Visit: Payer: Self-pay | Admitting: *Deleted

## 2017-05-21 DIAGNOSIS — E782 Mixed hyperlipidemia: Secondary | ICD-10-CM

## 2017-05-21 MED ORDER — ATORVASTATIN CALCIUM 40 MG PO TABS: 40.0000 mg | ORAL_TABLET | Freq: Every day | ORAL | 1 refills | Status: DC

## 2017-05-29 NOTE — Telephone Encounter (Signed)
Received this notice from [place] : "[name] spoke with her on 05/15/2017 and advised that she will be getting financials worked up Community education officer.  I just spoke with [name] and updated her that [name] will be calling her back by tomorrow or at the latest Friday, she is just needing to check on the Authorization.  [name] will Let you know when [name] is scheduled."

## 2017-05-29 NOTE — Telephone Encounter (Signed)
I have reached out to [facility] to find out what is going on. 

## 2017-05-29 NOTE — Telephone Encounter (Signed)
Pt called today, she has not heard from [place]. RN was skyped, she advised to call [phone] press 1, leave VM if they don't answer with name and dob. Rn said she will email [facility] also. msg relayed to pt, she understood and was appreciative  [phone]

## 2017-06-13 NOTE — Progress Notes (Signed)
Pt has appt 07/15/17 with MMM-note added in appt comments.

## 2017-07-16 ENCOUNTER — Ambulatory Visit (INDEPENDENT_AMBULATORY_CARE_PROVIDER_SITE_OTHER): Payer: BLUE CROSS/BLUE SHIELD | Admitting: Nurse Practitioner

## 2017-07-16 ENCOUNTER — Encounter: Payer: Self-pay | Admitting: Nurse Practitioner

## 2017-07-16 VITALS — BP 117/71 | HR 62 | Temp 97.0°F | Ht 64.0 in | Wt 220.0 lb

## 2017-07-16 DIAGNOSIS — I10 Essential (primary) hypertension: Secondary | ICD-10-CM | POA: Diagnosis not present

## 2017-07-16 DIAGNOSIS — F411 Generalized anxiety disorder: Secondary | ICD-10-CM | POA: Diagnosis not present

## 2017-07-16 DIAGNOSIS — E1142 Type 2 diabetes mellitus with diabetic polyneuropathy: Secondary | ICD-10-CM

## 2017-07-16 DIAGNOSIS — E039 Hypothyroidism, unspecified: Secondary | ICD-10-CM

## 2017-07-16 DIAGNOSIS — E782 Mixed hyperlipidemia: Secondary | ICD-10-CM

## 2017-07-16 DIAGNOSIS — F3341 Major depressive disorder, recurrent, in partial remission: Secondary | ICD-10-CM | POA: Diagnosis not present

## 2017-07-16 DIAGNOSIS — K5730 Diverticulosis of large intestine without perforation or abscess without bleeding: Secondary | ICD-10-CM

## 2017-07-16 DIAGNOSIS — G43711 Chronic migraine without aura, intractable, with status migrainosus: Secondary | ICD-10-CM | POA: Diagnosis not present

## 2017-07-16 DIAGNOSIS — G4730 Sleep apnea, unspecified: Secondary | ICD-10-CM

## 2017-07-16 LAB — BAYER DCA HB A1C WAIVED: HB A1C (BAYER DCA - WAIVED): 5.7 % (ref <7.0)

## 2017-07-16 MED ORDER — BENAZEPRIL HCL 20 MG PO TABS: 20.0000 mg | ORAL_TABLET | Freq: Every day | ORAL | 5 refills | Status: DC

## 2017-07-16 MED ORDER — FENOFIBRATE 160 MG PO TABS: 160.0000 mg | ORAL_TABLET | Freq: Every day | ORAL | 5 refills | Status: DC

## 2017-07-16 MED ORDER — CITALOPRAM HYDROBROMIDE 40 MG PO TABS: 40.0000 mg | ORAL_TABLET | Freq: Every day | ORAL | 0 refills | Status: DC

## 2017-07-16 MED ORDER — KETOROLAC TROMETHAMINE 60 MG/2ML IM SOLN
60.0000 mg | Freq: Once | INTRAMUSCULAR | Status: AC
  Administered 2017-07-16: 60 mg via INTRAMUSCULAR

## 2017-07-16 MED ORDER — PROPRANOLOL HCL 40 MG PO TABS
40.0000 mg | ORAL_TABLET | Freq: Two times a day (BID) | ORAL | 5 refills | Status: DC
Start: 2017-07-16 — End: 2017-07-17

## 2017-07-16 MED ORDER — BUPROPION HCL ER (XL) 150 MG PO TB24: 450.0000 mg | ORAL_TABLET | Freq: Every day | ORAL | 0 refills | Status: DC

## 2017-07-16 MED ORDER — ATORVASTATIN CALCIUM 40 MG PO TABS: 40.0000 mg | ORAL_TABLET | Freq: Every day | ORAL | 1 refills | Status: DC

## 2017-07-16 MED ORDER — TOPIRAMATE 50 MG PO TABS: 50.0000 mg | ORAL_TABLET | Freq: Two times a day (BID) | ORAL | 5 refills | Status: DC

## 2017-07-16 MED ORDER — GLIPIZIDE 10 MG PO TABS
ORAL_TABLET | ORAL | 5 refills | Status: DC
Start: 2017-07-16 — End: 2018-01-31

## 2017-07-16 MED ORDER — SUMATRIPTAN SUCCINATE 50 MG PO TABS: 50.0000 mg | ORAL_TABLET | ORAL | 5 refills | Status: DC | PRN

## 2017-07-16 NOTE — Progress Notes (Signed)
Subjective:    Patient ID: [name], female    DOB: 1960/08/06, 57 y.o.   MRN: [id number]  HPI   [name] is here today for follow up of chronic medical problem.  Outpatient Encounter Medications as of 07/16/2017  Medication Sig  . albuterol (PROVENTIL HFA;VENTOLIN HFA) 108 (90 Base) MCG/ACT inhaler Inhale 2 puffs into the lungs every 4 (four) hours as needed for wheezing or shortness of breath.  Marland Kitchen atorvastatin (LIPITOR) 40 MG tablet Take 1 tablet (40 mg total) by mouth daily.  . benazepril (LOTENSIN) 20 MG tablet Take 1 tablet (20 mg total) by mouth daily.  Marland Kitchen buPROPion (WELLBUTRIN XL) 150 MG 24 hr tablet TAKE 3 TABLETS (450 MG TOTAL) BY MOUTH DAILY.  . citalopram (CELEXA) 40 MG tablet Take 1 tablet (40 mg total) by mouth daily.  Marland Kitchen dicyclomine (BENTYL) 20 MG tablet Take one every 6-8hours for abd cramps  . EPINEPHrine 0.3 mg/0.3 mL IJ SOAJ injection INJECT 0.3 ML (0.3 MG TOTAL) INTO THE SHOULDER, THIGH, OR BUTTOCKS ONCE FOR 1 DOSE.  . fenofibrate 160 MG tablet Take 1 tablet (160 mg total) by mouth daily.  Marland Kitchen glipiZIDE (GLUCOTROL) 10 MG tablet TAKE ONE TABLET BY MOUTH EVERY DAY BEFORE BREAKFAST  . JANUMET 50-1000 MG tablet TAKE 1 TABLET BY MOUTH 2 (TWO) TIMES DAILY WITH A MEAL.  Marland Kitchen ondansetron (ZOFRAN) 4 MG tablet Take 1 tablet (4 mg total) by mouth every 8 (eight) hours as needed for nausea or vomiting.  . propranolol (INDERAL) 40 MG tablet Take 1 tablet (40 mg total) by mouth 2 (two) times daily.  Marland Kitchen topiramate (TOPAMAX) 50 MG tablet Take 1 tablet (50 mg total) by mouth 2 (two) times daily.     1. Type 2 diabetes mellitus with diabetic polyneuropathy, without long-term current use of insulin ([place]) last hgba1c was 6.4%. Fasting blood sugars are running around 150 most of time.  2. Mixed hyperlipidemia  Does not watch diet at all  3. Essential hypertension  No c/o chest pain, sob or headache. Does not check blood pressure at home. BP Readings from Last 3 Encounters:    04/15/17 112/63  03/14/17 130/65  03/05/17 109/64     4. Hypothyroidism, unspecified type  Not having any problems that he is aware of.  5. Anxiety state  Stays anxious- has been this way her whole life. celexa helps  6. Recurrent major depressive disorder, in partial remission ([place])  Again she is on celexa which is working well right now- no sideeffects  7. Morbid obesity ([place])  No recent weight changes of any significance    New complaints: Recently diagnosed with sleep apnea- waiting on cpap machine  Social history: Works at a bank   Review of Systems  Constitutional: Negative for activity change and appetite change.  HENT: Negative.   Eyes: Negative for pain.  Respiratory: Negative for shortness of breath.   Cardiovascular: Negative for chest pain, palpitations and leg swelling.  Gastrointestinal: Negative for abdominal pain.  Endocrine: Negative for polydipsia.  Genitourinary: Negative.   Skin: Negative for rash.  Neurological: Negative for dizziness, weakness and headaches.  Hematological: Does not bruise/bleed easily.  Psychiatric/Behavioral: Negative.   All other systems reviewed and are negative.      Objective:   Physical Exam  Constitutional: She is oriented to person, place, and time. She appears well-developed and well-nourished.  HENT:  Nose: Nose normal.  Mouth/Throat: Oropharynx is clear and moist.  Eyes: EOM are normal.  Neck:  Trachea normal, normal range of motion and full passive range of motion without pain. Neck supple. No JVD present. Carotid bruit is not present. No thyromegaly present.  Cardiovascular: Normal rate, regular rhythm, normal heart sounds and intact distal pulses. Exam reveals no gallop and no friction rub.  No murmur heard. Pulmonary/Chest: Effort normal and breath sounds normal.  Abdominal: Soft. Bowel sounds are normal. She exhibits no distension and no mass. There is no tenderness.  Musculoskeletal: Normal range of motion.   Lymphadenopathy:    She has no cervical adenopathy.  Neurological: She is alert and oriented to person, place, and time. She has normal reflexes.  Skin: Skin is warm and dry.  Psychiatric: She has a normal mood and affect. Her behavior is normal. Judgment and thought content normal.   BP 117/71   Pulse 62   Temp (!) 97 F (36.1 C) (Oral)   Ht _0  (1.626 m)   Wt 220 lb (99.8 kg)   BMI 37.76 kg/m   hgba1c 5.7% today      Assessment & Plan:  1. Type 2 diabetes mellitus with diabetic polyneuropathy, without long-term current use of insulin ([facility]) Continue carb counting - Bayer DCA Hb A1c Waived - glipiZIDE (GLUCOTROL) 10 MG tablet; TAKE ONE TABLET BY MOUTH EVERY DAY BEFORE BREAKFAST  Dispense: 30 tablet; Refill: 5  2. Mixed hyperlipidemia Low fat diet - Lipid panel - atorvastatin (LIPITOR) 40 MG tablet; Take 1 tablet (40 mg total) by mouth daily.  Dispense: 90 tablet; Refill: 1 - fenofibrate 160 MG tablet; Take 1 tablet (160 mg total) by mouth daily.  Dispense: 30 tablet; Refill: 5  3. Essential hypertension Low sodium diet - CMP14+EGFR - benazepril (LOTENSIN) 20 MG tablet; Take 1 tablet (20 mg total) by mouth daily.  Dispense: 30 tablet; Refill: 5  4. Hypothyroidism, unspecified type  5. Anxiety state Stress  management - buPROPion (WELLBUTRIN XL) 150 MG 24 hr tablet; Take 3 tablets (450 mg total) by mouth daily.  Dispense: 270 tablet; Refill: 0  6. Recurrent major depressive disorder, in partial remission ([facility]) - citalopram (CELEXA) 40 MG tablet; Take 1 tablet (40 mg total) by mouth daily.  Dispense: 90 tablet; Refill: 0  7. Morbid obesity ([place]) Discussed diet and exercise for person with BMI >25 Will recheck weight in 3-6 months  8. Sleep apnea, unspecified type Wear cpap nightly when you get it  9. Diverticulosis of large intestine without hemorrhage Watch diet  10. Intractable chronic migraine without aura and with status migrainosus Stress management will  help Avoid caffeine - topiramate (TOPAMAX) 50 MG tablet; Take 1 tablet (50 mg total) by mouth 2 (two) times daily.  Dispense: 60 tablet; Refill: 5 - propranolol (INDERAL) 40 MG tablet; Take 1 tablet (40 mg total) by mouth 2 (two) times daily.  Dispense: 60 tablet; Refill: 5  toradol injection today  Labs pending Health maintenance reviewed Diet and exercise encouraged Continue all meds Follow up  In 6 months   [place], FNP

## 2017-07-16 NOTE — Patient Instructions (Signed)

## 2017-07-17 ENCOUNTER — Other Ambulatory Visit: Payer: Self-pay

## 2017-07-17 DIAGNOSIS — G43711 Chronic migraine without aura, intractable, with status migrainosus: Secondary | ICD-10-CM

## 2017-07-17 DIAGNOSIS — I10 Essential (primary) hypertension: Secondary | ICD-10-CM

## 2017-07-17 LAB — LIPID PANEL
CHOLESTEROL TOTAL: 301 mg/dL — AB (ref 100–199)
Chol/HDL Ratio: 5.2 ratio — ABNORMAL HIGH (ref 0.0–4.4)
HDL: 58 mg/dL (ref >39)
LDL CALC: 204 mg/dL — AB (ref 0–99)
TRIGLYCERIDES: 197 mg/dL — AB (ref 0–149)
VLDL CHOLESTEROL CAL: 39 mg/dL (ref 5–40)

## 2017-07-17 LAB — CMP14+EGFR
ALBUMIN: 4.7 g/dL (ref 3.5–5.5)
ALK PHOS: 46 IU/L (ref 39–117)
ALT: 68 IU/L — AB (ref 0–32)
AST: 35 IU/L (ref 0–40)
Albumin/Globulin Ratio: 2 (ref 1.2–2.2)
BUN / CREAT RATIO: 19 (ref 9–23)
BUN: 22 mg/dL (ref 6–24)
Bilirubin Total: <0.2 mg/dL (ref 0.0–1.2)
CHLORIDE: 105 mmol/L (ref 96–106)
CO2: 21 mmol/L (ref 20–29)
CREATININE: 1.17 mg/dL — AB (ref 0.57–1.00)
Calcium: 10 mg/dL (ref 8.7–10.2)
GFR calc Af Amer: 60 mL/min/{1.73_m2} (ref >59)
GFR calc non Af Amer: 52 mL/min/{1.73_m2} — ABNORMAL LOW (ref >59)
GLUCOSE: 53 mg/dL — AB (ref 65–99)
Globulin, Total: 2.3 g/dL (ref 1.5–4.5)
Potassium: 4.7 mmol/L (ref 3.5–5.2)
Sodium: 141 mmol/L (ref 134–144)
TOTAL PROTEIN: 7 g/dL (ref 6.0–8.5)

## 2017-07-17 MED ORDER — BENAZEPRIL HCL 20 MG PO TABS: 20.0000 mg | ORAL_TABLET | Freq: Every day | ORAL | 1 refills | Status: DC

## 2017-07-17 MED ORDER — PROPRANOLOL HCL 40 MG PO TABS: 40.0000 mg | ORAL_TABLET | Freq: Two times a day (BID) | ORAL | 1 refills | Status: DC

## 2017-07-23 ENCOUNTER — Other Ambulatory Visit: Payer: Self-pay | Admitting: *Deleted

## 2017-07-23 DIAGNOSIS — E1142 Type 2 diabetes mellitus with diabetic polyneuropathy: Secondary | ICD-10-CM

## 2017-07-23 MED ORDER — SITAGLIPTIN PHOS-METFORMIN HCL 50-1000 MG PO TABS: 1.0000 | ORAL_TABLET | Freq: Two times a day (BID) | ORAL | 0 refills | Status: DC

## 2017-07-31 ENCOUNTER — Ambulatory Visit: Payer: Self-pay | Admitting: Neurology

## 2017-08-19 ENCOUNTER — Other Ambulatory Visit: Payer: Self-pay | Admitting: Nurse Practitioner

## 2017-08-19 DIAGNOSIS — N6489 Other specified disorders of breast: Secondary | ICD-10-CM

## 2017-08-22 ENCOUNTER — Telehealth: Payer: Self-pay | Admitting: Nurse Practitioner

## 2017-08-22 NOTE — Telephone Encounter (Signed)
Pt already scheduled for 09/03/2017 at [facility]/ Pt informed to bring disk with her to appointment
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
## 2017-08-28 NOTE — Telephone Encounter (Signed)
Received this notice from [facility]: "She was scheduled 07/23/2017 and no showed, we have been trying to reach her to reschedule.  We have been unsuccessful in reaching her to reschedule as of yet."

## 2017-08-29 ENCOUNTER — Ambulatory Visit (INDEPENDENT_AMBULATORY_CARE_PROVIDER_SITE_OTHER): Payer: BLUE CROSS/BLUE SHIELD | Admitting: *Deleted

## 2017-08-29 DIAGNOSIS — Z23 Encounter for immunization: Secondary | ICD-10-CM

## 2017-09-03 ENCOUNTER — Ambulatory Visit: Payer: Self-pay | Admitting: Neurology

## 2017-09-03 ENCOUNTER — Encounter (HOSPITAL_COMMUNITY): Payer: Self-pay

## 2017-09-12 ENCOUNTER — Encounter (HOSPITAL_COMMUNITY): Payer: Self-pay | Admitting: *Deleted

## 2017-09-12 ENCOUNTER — Emergency Department (HOSPITAL_COMMUNITY)
Admission: EM | Admit: 2017-09-12 | Discharge: 2017-09-12 | Disposition: A | Discharge status: Home / Self Care | Payer: No Typology Code available for payment source | Attending: Emergency Medicine | Admitting: Emergency Medicine

## 2017-09-12 ENCOUNTER — Emergency Department (HOSPITAL_COMMUNITY): Payer: No Typology Code available for payment source

## 2017-09-12 DIAGNOSIS — Y999 Unspecified external cause status: Secondary | ICD-10-CM | POA: Insufficient documentation

## 2017-09-12 DIAGNOSIS — W2211XA Striking against or struck by driver side automobile airbag, initial encounter: Secondary | ICD-10-CM | POA: Diagnosis not present

## 2017-09-12 DIAGNOSIS — S301XXA Contusion of abdominal wall, initial encounter: Secondary | ICD-10-CM | POA: Diagnosis not present

## 2017-09-12 DIAGNOSIS — S20212A Contusion of left front wall of thorax, initial encounter: Secondary | ICD-10-CM | POA: Diagnosis not present

## 2017-09-12 DIAGNOSIS — T23291A Burn of second degree of multiple sites of right wrist and hand, initial encounter: Secondary | ICD-10-CM | POA: Insufficient documentation

## 2017-09-12 DIAGNOSIS — S6981XA Other specified injuries of right wrist, hand and finger(s), initial encounter: Secondary | ICD-10-CM | POA: Diagnosis present

## 2017-09-12 DIAGNOSIS — Y92410 Unspecified street and highway as the place of occurrence of the external cause: Secondary | ICD-10-CM | POA: Insufficient documentation

## 2017-09-12 DIAGNOSIS — T310 Burns involving less than 10% of body surface: Secondary | ICD-10-CM | POA: Insufficient documentation

## 2017-09-12 DIAGNOSIS — Y9389 Activity, other specified: Secondary | ICD-10-CM | POA: Insufficient documentation

## 2017-09-12 DIAGNOSIS — T23201A Burn of second degree of right hand, unspecified site, initial encounter: Secondary | ICD-10-CM

## 2017-09-12 DIAGNOSIS — X04XXXA Exposure to ignition of highly flammable material, initial encounter: Secondary | ICD-10-CM | POA: Diagnosis not present

## 2017-09-12 DIAGNOSIS — R05 Cough: Secondary | ICD-10-CM | POA: Insufficient documentation

## 2017-09-12 DIAGNOSIS — T23271A Burn of second degree of right wrist, initial encounter: Secondary | ICD-10-CM

## 2017-09-12 DIAGNOSIS — T07XXXA Unspecified multiple injuries, initial encounter: Secondary | ICD-10-CM

## 2017-09-12 LAB — CBC WITH DIFFERENTIAL/PLATELET
BASOS ABS: 0 10*3/uL (ref 0.0–0.1)
Basophils Relative: 0 %
EOS ABS: 0.1 10*3/uL (ref 0.0–0.7)
EOS PCT: 2 %
HCT: 35 % — ABNORMAL LOW (ref 36.0–46.0)
Hemoglobin: 11.1 g/dL — ABNORMAL LOW (ref 12.0–15.0)
Lymphocytes Relative: 32 %
Lymphs Abs: 2.4 10*3/uL (ref 0.7–4.0)
MCH: 29.1 pg (ref 26.0–34.0)
MCHC: 31.7 g/dL (ref 30.0–36.0)
MCV: 91.6 fL (ref 78.0–100.0)
MONO ABS: 0.7 10*3/uL (ref 0.1–1.0)
Monocytes Relative: 9 %
Neutro Abs: 4.4 10*3/uL (ref 1.7–7.7)
Neutrophils Relative %: 57 %
PLATELETS: 225 10*3/uL (ref 150–400)
RBC: 3.82 MIL/uL — ABNORMAL LOW (ref 3.87–5.11)
RDW: 13.4 % (ref 11.5–15.5)
WBC: 7.6 10*3/uL (ref 4.0–10.5)

## 2017-09-12 LAB — URINALYSIS, ROUTINE W REFLEX MICROSCOPIC
Bilirubin Urine: NEGATIVE
GLUCOSE, UA: NEGATIVE mg/dL
HGB URINE DIPSTICK: NEGATIVE
KETONES UR: NEGATIVE mg/dL
Leukocytes, UA: NEGATIVE
Nitrite: NEGATIVE
Protein, ur: NEGATIVE mg/dL
Specific Gravity, Urine: 1.016 (ref 1.005–1.030)
pH: 8 (ref 5.0–8.0)

## 2017-09-12 LAB — COMPREHENSIVE METABOLIC PANEL
ALBUMIN: 3.9 g/dL (ref 3.5–5.0)
ALK PHOS: 36 U/L — AB (ref 38–126)
ALT: 75 U/L — AB (ref 14–54)
AST: 46 U/L — AB (ref 15–41)
Anion gap: 8 (ref 5–15)
BILIRUBIN TOTAL: 0.5 mg/dL (ref 0.3–1.2)
BUN: 22 mg/dL — AB (ref 6–20)
CO2: 23 mmol/L (ref 22–32)
CREATININE: 1.35 mg/dL — AB (ref 0.44–1.00)
Calcium: 9.3 mg/dL (ref 8.9–10.3)
Chloride: 109 mmol/L (ref 101–111)
GFR calc Af Amer: 50 mL/min — ABNORMAL LOW (ref >60)
GFR, EST NON AFRICAN AMERICAN: 43 mL/min — AB (ref >60)
GLUCOSE: 70 mg/dL (ref 65–99)
POTASSIUM: 4.3 mmol/L (ref 3.5–5.1)
Sodium: 140 mmol/L (ref 135–145)
TOTAL PROTEIN: 7.1 g/dL (ref 6.5–8.1)

## 2017-09-12 MED ORDER — SILVER SULFADIAZINE 1 % EX CREA: TOPICAL_CREAM | Freq: Two times a day (BID) | CUTANEOUS | Status: DC

## 2017-09-12 MED ORDER — SILVER SULFADIAZINE 1 % EX CREA
TOPICAL_CREAM | Freq: Once | CUTANEOUS | Status: AC
  Administered 2017-09-12: 13:00:00 via TOPICAL
  Filled 2017-09-12: qty 50

## 2017-09-12 MED ORDER — IPRATROPIUM-ALBUTEROL 0.5-2.5 (3) MG/3ML IN SOLN
3.0000 mL | Freq: Once | RESPIRATORY_TRACT | Status: AC
  Administered 2017-09-12: 3 mL via RESPIRATORY_TRACT
  Filled 2017-09-12: qty 3

## 2017-09-12 MED ORDER — CYCLOBENZAPRINE HCL 10 MG PO TABS
10.0000 mg | ORAL_TABLET | Freq: Once | ORAL | Status: AC
  Administered 2017-09-12: 10 mg via ORAL
  Filled 2017-09-12: qty 1

## 2017-09-12 MED ORDER — CYCLOBENZAPRINE HCL 10 MG PO TABS: 10.0000 mg | ORAL_TABLET | Freq: Two times a day (BID) | ORAL | 0 refills | Status: DC | PRN

## 2017-09-12 MED ORDER — SILVER SULFADIAZINE 1 % EX CREA: 1.0000 "application " | TOPICAL_CREAM | Freq: Two times a day (BID) | CUTANEOUS | 0 refills | Status: DC

## 2017-09-12 MED ORDER — OXYCODONE-ACETAMINOPHEN 5-325 MG PO TABS
1.0000 | ORAL_TABLET | Freq: Once | ORAL | Status: AC
  Administered 2017-09-12: 1 via ORAL
  Filled 2017-09-12: qty 1

## 2017-09-12 MED ORDER — IOPAMIDOL (ISOVUE-300) INJECTION 61%
INTRAVENOUS | Status: AC
  Administered 2017-09-12: 80 mL via INTRAVENOUS
  Filled 2017-09-12: qty 100

## 2017-09-12 MED ORDER — OXYCODONE-ACETAMINOPHEN 5-325 MG PO TABS: 1.0000 | ORAL_TABLET | ORAL | 0 refills | Status: DC | PRN

## 2017-09-12 MED ORDER — IOPAMIDOL (ISOVUE-300) INJECTION 61%
100.0000 mL | Freq: Once | INTRAVENOUS | Status: AC | PRN
  Administered 2017-09-12: 80 mL via INTRAVENOUS
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
## 2017-10-22 ENCOUNTER — Ambulatory Visit (INDEPENDENT_AMBULATORY_CARE_PROVIDER_SITE_OTHER): Payer: BLUE CROSS/BLUE SHIELD | Admitting: Nurse Practitioner

## 2017-10-22 ENCOUNTER — Encounter: Payer: Self-pay | Admitting: Nurse Practitioner

## 2017-10-22 VITALS — BP 119/72 | HR 70 | Temp 97.5°F | Ht 64.0 in | Wt 217.0 lb

## 2017-10-22 DIAGNOSIS — G43119 Migraine with aura, intractable, without status migrainosus: Secondary | ICD-10-CM | POA: Diagnosis not present

## 2017-10-22 MED ORDER — SUMATRIPTAN SUCCINATE 50 MG PO TABS: 50.0000 mg | ORAL_TABLET | ORAL | 5 refills | Status: DC | PRN

## 2017-10-22 MED ORDER — KETOROLAC TROMETHAMINE 60 MG/2ML IM SOLN
60.0000 mg | Freq: Once | INTRAMUSCULAR | Status: AC
  Administered 2017-10-22: 60 mg via INTRAMUSCULAR

## 2017-10-22 NOTE — Progress Notes (Signed)
   Subjective:    Patient ID: [name], female    DOB: 01-Apr-1961, 57 y.o.   MRN: [id number]   Chief Complaint: Migraine   HPI Patient comes in today c/o migraine. She has a long history of migraines. This migraine started yesterday. She took imitrex which eased it for a little bit bbut came back with vengeance this morning. Rates pain 10/10 right now. Lights make it feel worse. Slight nausea.    Review of Systems  Constitutional: Negative for activity change and appetite change.  HENT: Negative.   Eyes: Negative for pain.  Respiratory: Negative for shortness of breath.   Cardiovascular: Negative for chest pain, palpitations and leg swelling.  Gastrointestinal: Positive for nausea. Negative for abdominal pain.  Endocrine: Negative for polydipsia.  Genitourinary: Negative.   Skin: Negative for rash.  Neurological: Negative for dizziness, weakness and headaches.  Hematological: Does not bruise/bleed easily.  Psychiatric/Behavioral: Negative.   All other systems reviewed and are negative.      Objective:   Physical Exam  Constitutional: She is oriented to person, place, and time. She appears well-developed and well-nourished. She appears distressed (moderate).  Eyes: Pupils are equal, round, and reactive to light. EOM are normal.  Cardiovascular: Normal rate.  Pulmonary/Chest: Effort normal.  Neurological: She is alert and oriented to person, place, and time. No cranial nerve deficit.  Skin: Skin is warm and dry.  Psychiatric: She has a normal mood and affect. Her behavior is normal. Judgment and thought content normal.   BP 119/72   Pulse 70   Temp (!) 97.5 F (36.4 C) (Oral)   Ht 5\' 4"  (1.626 m)   Wt 217 lb (98.4 kg)   BMI 37.25 kg/m          Assessment & Plan:  [name] in today with chief complaint of Migraine   1. Intractable migraine with aura without status migrainosus Rest Ice to head if helps Avoid caffeine RTO prn -  ketorolac (TORADOL) injection 60 mg  Mary-Margaret Hassell Done, FNP

## 2017-10-22 NOTE — Patient Instructions (Signed)
Recurrent Migraine Headache °A migraine headache is very bad, throbbing pain that is usually on one side of your head. Recurrent migraines keep coming back (recurring). Talk with your doctor about what things may bring on (trigger) your migraine headaches. °Follow these instructions at home: °Medicines  °· Take over-the-counter and prescription medicines only as told by your doctor. °· Do not drive or use heavy machinery while taking prescription pain medicine. °Lifestyle  °· Do not use any products that contain nicotine or tobacco, such as cigarettes and e-cigarettes. If you need help quitting, ask your doctor. °· Limit alcohol intake to no more than 1 drink a day for nonpregnant women and 2 drinks a day for men. One drink equals 12 oz of beer, 5 oz of wine, or 1½ oz of hard liquor. °· Get 7-9 hours of sleep each night. °· Lessen any stress in your life. Ask your doctor about ways to lower your stress. °· Stay at a healthy weight. Talk with your doctor if you need help losing weight. °· Get regular exercise. °General instructions  °· Keep a journal to find out if certain things bring on migraine headaches. For example, write down: °¨ What you eat and drink. °¨ How much sleep you get. °¨ Any change to your diet or medicines. °· Lie down in a dark, quiet room when you have a migraine. °· Try placing a cool towel over your head when you have a migraine. °· Keep lights dim if bright lights bother you or make your migraines worse. °· Keep all follow-up visits as told by your doctor. This is important. °Contact a doctor if: °· Medicine does not help your migraines. °· Your pain keeps coming back. °· You have a fever. °· You have weight loss without trying. °Get help right away if: °· Your migraine becomes really bad and medicine does not help. °· You have a stiff neck. °· You have trouble seeing. °· Your muscles are weak or you lose control of your muscles. °· You lose your balance or have trouble walking. °· You feel  like you will pass out (faint) or you pass out. °· You have really bad symptoms that are different than your first symptoms. °· You start having sudden, very bad headaches that last for one second or less, like a thunderclap. °Summary °· A migraine headache is very bad, throbbing pain that is usually on one side of your head. °· Talk with your doctor about what things may bring on (trigger) your migraine headaches. °· Take over-the-counter and prescription medicines only as told by your doctor. °· Lie down in a dark, quiet room when you have a migraine. °· Keep a journal about what you eat and drink, how much sleep you get, and any changes to your medicines. This can help you find out if certain things make you have migraine headaches. °This information is not intended to replace advice given to you by your health care provider. Make sure you discuss any questions you have with your health care provider. °Document Released: 03/06/2008 Document Revised: 04/20/2016 Document Reviewed: 04/20/2016 °Elsevier Interactive Patient Education © 2017 [employer] ° °

## 2017-10-23 ENCOUNTER — Telehealth: Payer: Self-pay | Admitting: Nurse Practitioner

## 2017-10-23 NOTE — Telephone Encounter (Signed)
Patient didn't go to work today but will go back tomorrow- note fixed.

## 2017-10-23 NOTE — Telephone Encounter (Signed)
Pt is wanting to speak to [facility] about her doctors note from yesterday

## 2017-10-28 ENCOUNTER — Other Ambulatory Visit: Payer: Self-pay | Admitting: Nurse Practitioner

## 2017-10-28 DIAGNOSIS — F411 Generalized anxiety disorder: Secondary | ICD-10-CM

## 2017-11-01 ENCOUNTER — Other Ambulatory Visit: Payer: Self-pay | Admitting: Nurse Practitioner

## 2017-11-01 ENCOUNTER — Telehealth: Payer: Self-pay

## 2017-11-01 DIAGNOSIS — N6320 Unspecified lump in the left breast, unspecified quadrant: Secondary | ICD-10-CM

## 2017-11-01 NOTE — Telephone Encounter (Signed)
Patient [place] mammo on bus and they found a spot on L breast  Needs Diagnostic at [facility] set up

## 2017-11-19 ENCOUNTER — Encounter (HOSPITAL_COMMUNITY): Payer: Self-pay

## 2017-12-03 ENCOUNTER — Other Ambulatory Visit: Payer: Self-pay | Admitting: Nurse Practitioner

## 2017-12-03 DIAGNOSIS — F3341 Major depressive disorder, recurrent, in partial remission: Secondary | ICD-10-CM

## 2018-01-31 ENCOUNTER — Other Ambulatory Visit: Payer: Self-pay | Admitting: Nurse Practitioner

## 2018-01-31 DIAGNOSIS — I10 Essential (primary) hypertension: Secondary | ICD-10-CM

## 2018-01-31 DIAGNOSIS — E1142 Type 2 diabetes mellitus with diabetic polyneuropathy: Secondary | ICD-10-CM

## 2018-01-31 DIAGNOSIS — G43711 Chronic migraine without aura, intractable, with status migrainosus: Secondary | ICD-10-CM

## 2018-02-03 ENCOUNTER — Other Ambulatory Visit: Payer: Self-pay | Admitting: Nurse Practitioner

## 2018-02-03 DIAGNOSIS — E1142 Type 2 diabetes mellitus with diabetic polyneuropathy: Secondary | ICD-10-CM

## 2018-02-03 DIAGNOSIS — I10 Essential (primary) hypertension: Secondary | ICD-10-CM

## 2018-02-03 DIAGNOSIS — G43711 Chronic migraine without aura, intractable, with status migrainosus: Secondary | ICD-10-CM

## 2018-02-03 NOTE — Telephone Encounter (Signed)
Last seen 10/22/17  MMM 

## 2018-02-22 ENCOUNTER — Other Ambulatory Visit: Payer: Self-pay | Admitting: Nurse Practitioner

## 2018-02-22 DIAGNOSIS — E782 Mixed hyperlipidemia: Secondary | ICD-10-CM

## 2018-03-05 ENCOUNTER — Other Ambulatory Visit: Payer: Self-pay | Admitting: Nurse Practitioner

## 2018-03-05 DIAGNOSIS — G43711 Chronic migraine without aura, intractable, with status migrainosus: Secondary | ICD-10-CM

## 2018-03-05 DIAGNOSIS — F3341 Major depressive disorder, recurrent, in partial remission: Secondary | ICD-10-CM

## 2018-03-06 NOTE — Telephone Encounter (Signed)
Last seen 10/22/17  MMM 

## 2018-03-23 ENCOUNTER — Other Ambulatory Visit: Payer: Self-pay | Admitting: Nurse Practitioner

## 2018-03-23 DIAGNOSIS — E782 Mixed hyperlipidemia: Secondary | ICD-10-CM

## 2018-04-08 ENCOUNTER — Other Ambulatory Visit: Payer: Self-pay

## 2018-04-08 MED ORDER — ALBUTEROL SULFATE HFA 108 (90 BASE) MCG/ACT IN AERS: 2.0000 | INHALATION_SPRAY | RESPIRATORY_TRACT | 0 refills | Status: DC | PRN

## 2018-04-20 ENCOUNTER — Other Ambulatory Visit: Payer: Self-pay | Admitting: Nurse Practitioner

## 2018-04-29 ENCOUNTER — Other Ambulatory Visit: Payer: Self-pay | Admitting: Nurse Practitioner

## 2018-04-29 DIAGNOSIS — E782 Mixed hyperlipidemia: Secondary | ICD-10-CM

## 2018-04-29 DIAGNOSIS — G43711 Chronic migraine without aura, intractable, with status migrainosus: Secondary | ICD-10-CM

## 2018-04-29 DIAGNOSIS — F411 Generalized anxiety disorder: Secondary | ICD-10-CM

## 2018-04-30 NOTE — Telephone Encounter (Signed)
Last seen 10/22/17

## 2018-04-30 NOTE — Telephone Encounter (Signed)
Last seen 10/22/17 and last lipid 07/16/17

## 2018-05-15 ENCOUNTER — Other Ambulatory Visit: Payer: Self-pay | Admitting: Nurse Practitioner

## 2018-05-15 DIAGNOSIS — G43711 Chronic migraine without aura, intractable, with status migrainosus: Secondary | ICD-10-CM

## 2018-05-16 MED ORDER — TOPIRAMATE 50 MG PO TABS: 50.0000 mg | ORAL_TABLET | Freq: Two times a day (BID) | ORAL | 0 refills | Status: DC

## 2018-05-16 NOTE — Telephone Encounter (Signed)
Last seen 10/22/17  MMM

## 2018-05-16 NOTE — Telephone Encounter (Signed)
Refill failed. resent °

## 2018-05-16 NOTE — Addendum Note (Signed)
Addended by: Antonietta Barcelona D on: 05/16/2018 03:36 PM   Modules accepted: Orders

## 2018-06-03 IMAGING — DX DG CHEST 2V
2 series · 2 of 2 positions shown · non-contrast
Comparison: 12/03/2012

CLINICAL DATA: Cough, dyspnea, wheezing x4 days

EXAM:
CHEST  2 VIEW

[chest pa]
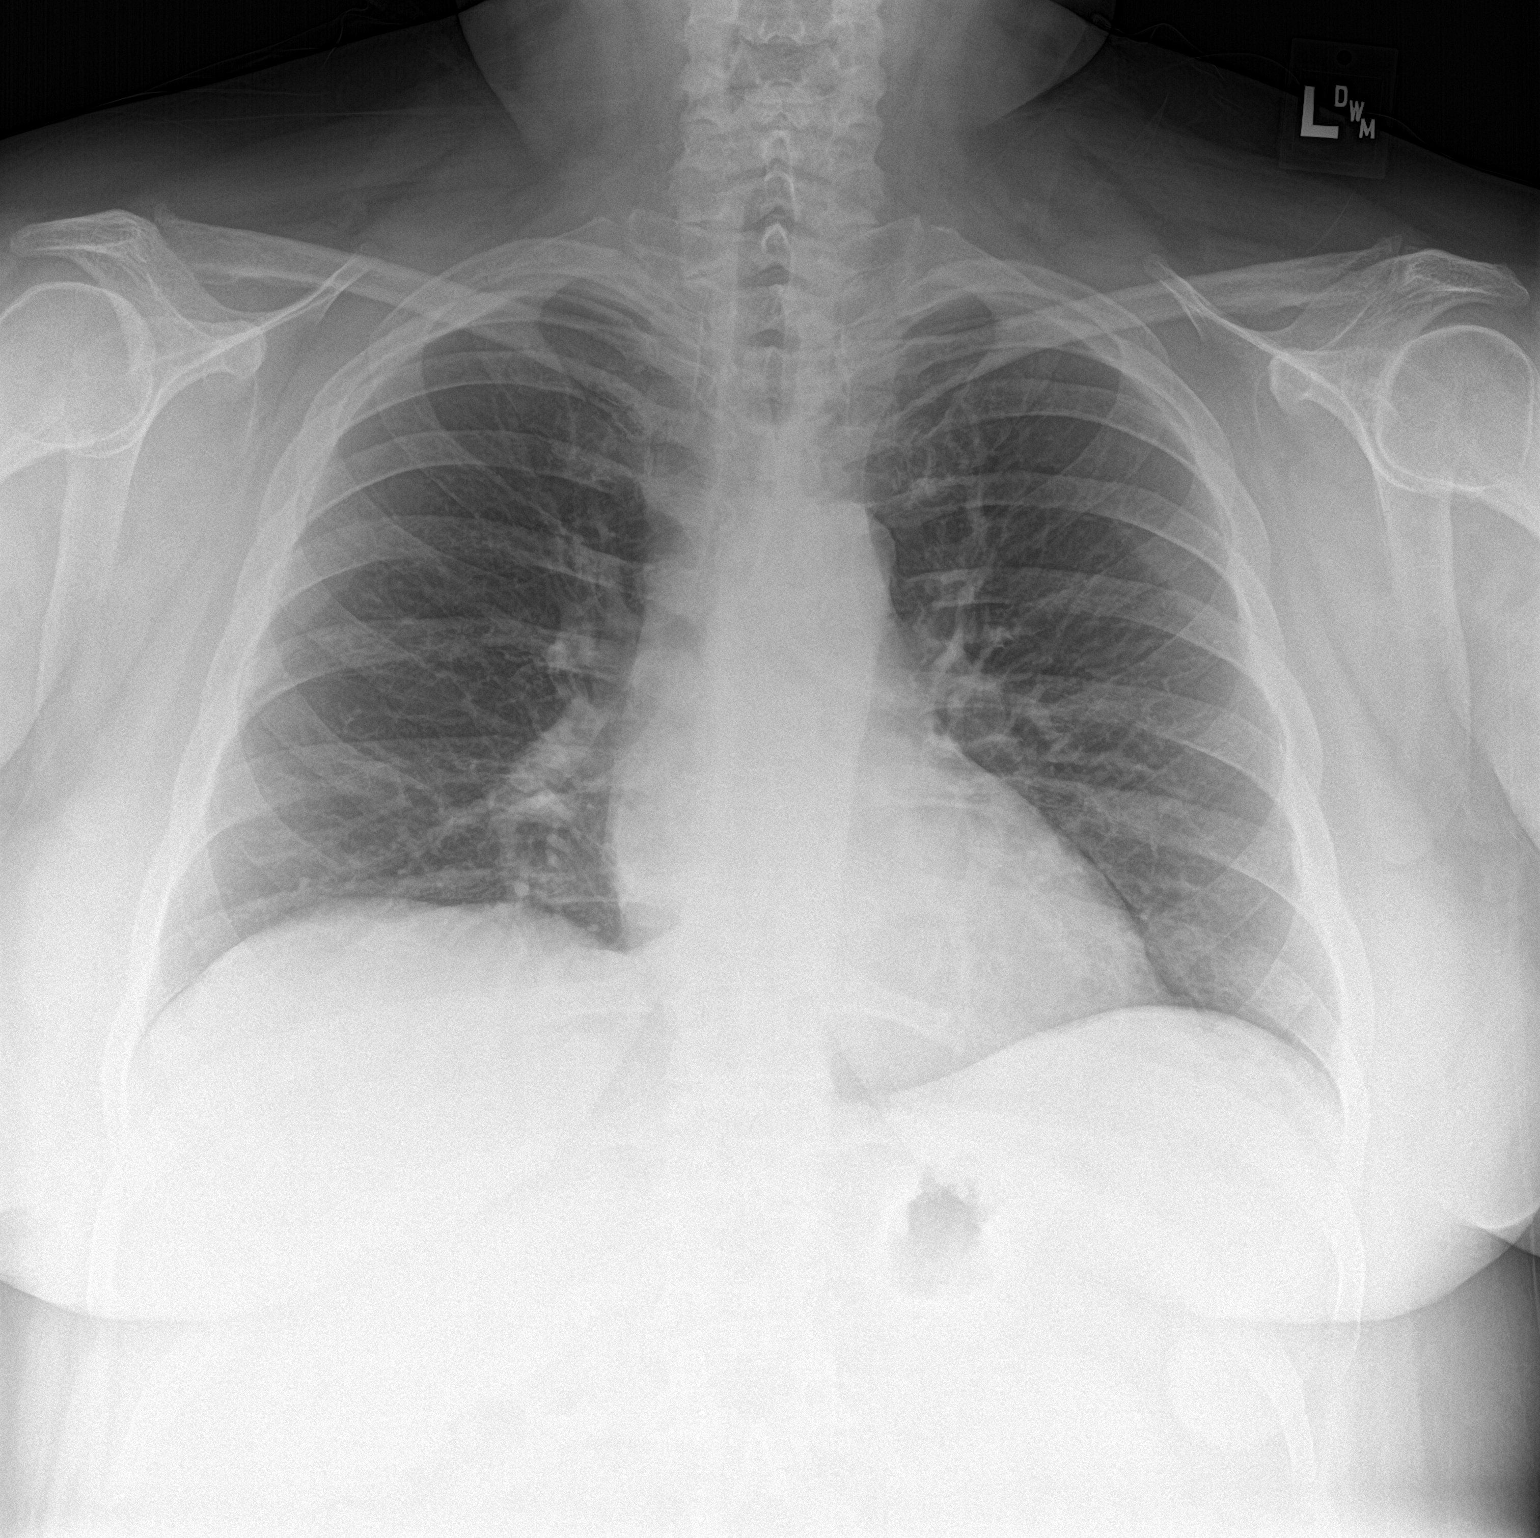

[chest lat]
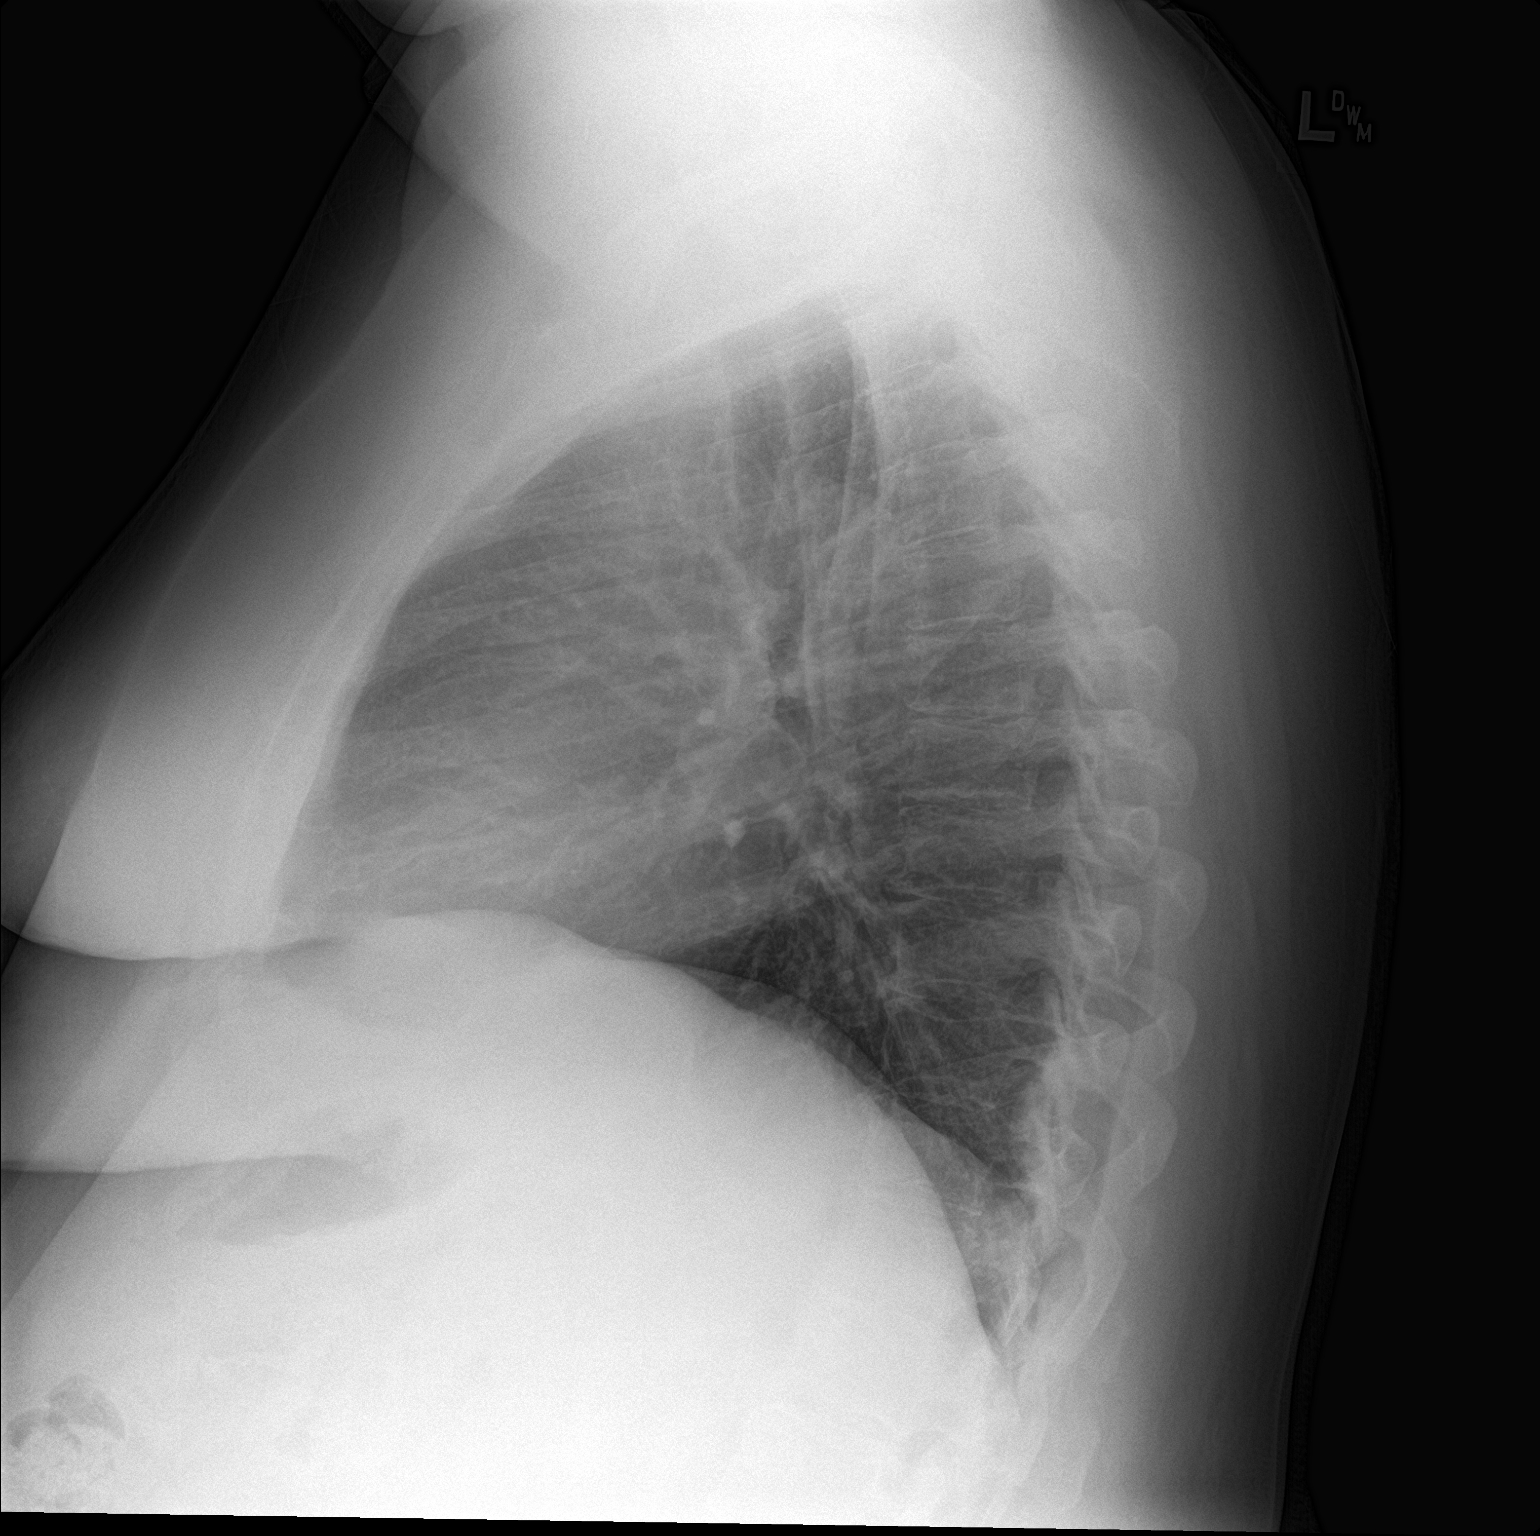

[2 of 2 positions shown; findings below may reference images not displayed]

FINDINGS: Lungs are clear.  No pleural effusion or pneumothorax.

The heart is normal in size.

Mild degenerative changes of the visualized thoracolumbar spine.
IMPRESSION: No evidence of acute cardiopulmonary disease.

## 2018-06-16 ENCOUNTER — Other Ambulatory Visit: Payer: Self-pay | Admitting: Nurse Practitioner

## 2018-06-16 DIAGNOSIS — E782 Mixed hyperlipidemia: Secondary | ICD-10-CM

## 2018-06-16 DIAGNOSIS — I10 Essential (primary) hypertension: Secondary | ICD-10-CM

## 2018-06-16 DIAGNOSIS — G43711 Chronic migraine without aura, intractable, with status migrainosus: Secondary | ICD-10-CM

## 2018-06-17 ENCOUNTER — Emergency Department (HOSPITAL_COMMUNITY): Payer: Self-pay

## 2018-06-17 ENCOUNTER — Other Ambulatory Visit: Payer: Self-pay

## 2018-06-17 ENCOUNTER — Emergency Department (HOSPITAL_COMMUNITY)
Admission: EM | Admit: 2018-06-17 | Discharge: 2018-06-17 | Disposition: A | Discharge status: Home / Self Care | Payer: Self-pay | Attending: Emergency Medicine | Admitting: Emergency Medicine

## 2018-06-17 DIAGNOSIS — Z7984 Long term (current) use of oral hypoglycemic drugs: Secondary | ICD-10-CM | POA: Insufficient documentation

## 2018-06-17 DIAGNOSIS — J45909 Unspecified asthma, uncomplicated: Secondary | ICD-10-CM | POA: Insufficient documentation

## 2018-06-17 DIAGNOSIS — I10 Essential (primary) hypertension: Secondary | ICD-10-CM | POA: Insufficient documentation

## 2018-06-17 DIAGNOSIS — R42 Dizziness and giddiness: Secondary | ICD-10-CM | POA: Insufficient documentation

## 2018-06-17 DIAGNOSIS — R51 Headache: Secondary | ICD-10-CM | POA: Insufficient documentation

## 2018-06-17 DIAGNOSIS — Z79899 Other long term (current) drug therapy: Secondary | ICD-10-CM | POA: Insufficient documentation

## 2018-06-17 DIAGNOSIS — R2689 Other abnormalities of gait and mobility: Secondary | ICD-10-CM | POA: Insufficient documentation

## 2018-06-17 DIAGNOSIS — E039 Hypothyroidism, unspecified: Secondary | ICD-10-CM | POA: Insufficient documentation

## 2018-06-17 DIAGNOSIS — E119 Type 2 diabetes mellitus without complications: Secondary | ICD-10-CM | POA: Insufficient documentation

## 2018-06-17 LAB — CBC
HCT: 33.2 % — ABNORMAL LOW (ref 36.0–46.0)
Hemoglobin: 10.3 g/dL — ABNORMAL LOW (ref 12.0–15.0)
MCH: 28.9 pg (ref 26.0–34.0)
MCHC: 31 g/dL (ref 30.0–36.0)
MCV: 93 fL (ref 80.0–100.0)
Platelets: 174 10*3/uL (ref 150–400)
RBC: 3.57 MIL/uL — AB (ref 3.87–5.11)
RDW: 13.1 % (ref 11.5–15.5)
WBC: 6.7 10*3/uL (ref 4.0–10.5)
nRBC: 0 % (ref 0.0–0.2)

## 2018-06-17 LAB — URINALYSIS, ROUTINE W REFLEX MICROSCOPIC
Bilirubin Urine: NEGATIVE
Glucose, UA: NEGATIVE mg/dL
Hgb urine dipstick: NEGATIVE
Ketones, ur: 5 mg/dL — AB
Leukocytes, UA: NEGATIVE
Nitrite: NEGATIVE
Protein, ur: NEGATIVE mg/dL
Specific Gravity, Urine: 1.027 (ref 1.005–1.030)
pH: 5 (ref 5.0–8.0)

## 2018-06-17 LAB — RAPID URINE DRUG SCREEN, HOSP PERFORMED
AMPHETAMINES: NOT DETECTED
Barbiturates: NOT DETECTED
Benzodiazepines: NOT DETECTED
Cocaine: NOT DETECTED
Opiates: NOT DETECTED
Tetrahydrocannabinol: NOT DETECTED

## 2018-06-17 LAB — COMPREHENSIVE METABOLIC PANEL
ALT: 51 U/L — ABNORMAL HIGH (ref 0–44)
AST: 23 U/L (ref 15–41)
Albumin: 3.4 g/dL — ABNORMAL LOW (ref 3.5–5.0)
Alkaline Phosphatase: 33 U/L — ABNORMAL LOW (ref 38–126)
Anion gap: 8 (ref 5–15)
BUN: 19 mg/dL (ref 6–20)
CO2: 18 mmol/L — ABNORMAL LOW (ref 22–32)
Calcium: 8.4 mg/dL — ABNORMAL LOW (ref 8.9–10.3)
Chloride: 112 mmol/L — ABNORMAL HIGH (ref 98–111)
Creatinine, Ser: 1.18 mg/dL — ABNORMAL HIGH (ref 0.44–1.00)
GFR calc Af Amer: 59 mL/min — ABNORMAL LOW (ref >60)
GFR calc non Af Amer: 51 mL/min — ABNORMAL LOW (ref >60)
Glucose, Bld: 174 mg/dL — ABNORMAL HIGH (ref 70–99)
Potassium: 3.9 mmol/L (ref 3.5–5.1)
Sodium: 138 mmol/L (ref 135–145)
Total Bilirubin: 0.6 mg/dL (ref 0.3–1.2)
Total Protein: 5.9 g/dL — ABNORMAL LOW (ref 6.5–8.1)

## 2018-06-17 LAB — DIFFERENTIAL
Abs Immature Granulocytes: 0.11 10*3/uL — ABNORMAL HIGH (ref 0.00–0.07)
BASOS ABS: 0 10*3/uL (ref 0.0–0.1)
Basophils Relative: 0 %
Eosinophils Absolute: 0.1 10*3/uL (ref 0.0–0.5)
Eosinophils Relative: 2 %
Immature Granulocytes: 2 %
Lymphocytes Relative: 26 %
Lymphs Abs: 1.8 10*3/uL (ref 0.7–4.0)
Monocytes Absolute: 0.4 10*3/uL (ref 0.1–1.0)
Monocytes Relative: 6 %
Neutro Abs: 4.3 10*3/uL (ref 1.7–7.7)
Neutrophils Relative %: 64 %

## 2018-06-17 LAB — I-STAT BETA HCG BLOOD, ED (MC, WL, AP ONLY): I-stat hCG, quantitative: <5 m[IU]/mL (ref <5)

## 2018-06-17 LAB — PROTIME-INR
INR: 1.04
Prothrombin Time: 13.5 seconds (ref 11.4–15.2)

## 2018-06-17 LAB — I-STAT CHEM 8, ED
BUN: 19 mg/dL (ref 6–20)
CREATININE: 1.2 mg/dL — AB (ref 0.44–1.00)
Calcium, Ion: 1.19 mmol/L (ref 1.15–1.40)
Chloride: 112 mmol/L — ABNORMAL HIGH (ref 98–111)
Glucose, Bld: 169 mg/dL — ABNORMAL HIGH (ref 70–99)
HCT: 33 % — ABNORMAL LOW (ref 36.0–46.0)
Hemoglobin: 11.2 g/dL — ABNORMAL LOW (ref 12.0–15.0)
Potassium: 4 mmol/L (ref 3.5–5.1)
Sodium: 142 mmol/L (ref 135–145)
TCO2: 18 mmol/L — ABNORMAL LOW (ref 22–32)

## 2018-06-17 LAB — I-STAT TROPONIN, ED: Troponin i, poc: 0 ng/mL (ref 0.00–0.08)

## 2018-06-17 LAB — APTT: aPTT: 28 seconds (ref 24–36)

## 2018-06-17 LAB — ETHANOL: Alcohol, Ethyl (B): <10 mg/dL (ref <10)

## 2018-06-17 MED ORDER — MECLIZINE HCL 25 MG PO TABS
25.0000 mg | ORAL_TABLET | Freq: Once | ORAL | Status: AC
  Administered 2018-06-17: 25 mg via ORAL
  Filled 2018-06-17: qty 1

## 2018-06-17 MED ORDER — DIAZEPAM 5 MG PO TABS: 5.0000 mg | ORAL_TABLET | Freq: Two times a day (BID) | ORAL | 0 refills | Status: DC

## 2018-06-17 MED ORDER — DIAZEPAM 5 MG PO TABS: 5.0000 mg | ORAL_TABLET | Freq: Two times a day (BID) | ORAL | 0 refills | Status: AC

## 2018-06-17 MED ORDER — MECLIZINE HCL 25 MG PO TABS: 25.0000 mg | ORAL_TABLET | Freq: Two times a day (BID) | ORAL | 0 refills | Status: DC | PRN

## 2018-06-17 MED ORDER — LORAZEPAM 1 MG PO TABS
1.0000 mg | ORAL_TABLET | Freq: Once | ORAL | Status: AC | PRN
  Administered 2018-06-17: 1 mg via ORAL
  Filled 2018-06-17: qty 1

## 2018-06-17 MED ORDER — DIAZEPAM 5 MG PO TABS
10.0000 mg | ORAL_TABLET | Freq: Once | ORAL | Status: AC
  Administered 2018-06-17: 10 mg via ORAL
  Filled 2018-06-17: qty 2

## 2018-06-17 NOTE — ED Notes (Signed)
Patient unable to stand for orthostatics.  

## 2018-06-17 NOTE — ED Provider Notes (Signed)
  Physical Exam  BP (!) 103/43   Pulse (!) 52   Temp (!) 97.3 F (36.3 C) (Oral)   Resp 15   Ht 5\' 4"  (1.626 m)   Wt 99.8 kg   SpO2 97%   BMI 37.76 kg/m   Physical Exam  ED Course/Procedures     Procedures  MDM   Patient received from Montine Circle, PA at shift change.  Please see his note for a full HPI in brief patient with sudden onset of dizziness after waking up this morning.  Previous history of vertigo.  Labs were reassuring, patient received meclizine by my colleague, her CT was normal.  Plan is MRI and ambulation.  10:26 AM MRI brain: Normal MRI the brain. No acute or focal lesion to explain the  patient's vertigo.  At this time will try to ambulate patient.  11:09 AM the to ambulate patient by myself and nurse Caren Griffins RN, patient states slightly improvement in her symptoms, she continues to be dizzy and not very steady on gait will attempt some Valium and ambulate prior to discharge.Valium ordered.   1:02 PM was ambulated by myself and nurse tech Alexa, patient's has improved however she still very unbalanced.  Decision conversation with patient and husband at the bedside, patient would like to go home at this time.  She reports Valium worked better for her symptoms over [place].  Colleague had prescribed patient antivert will change this to Valium, will however try Antivert versus Valium prescription at home.  She will follow-up with PCP as needed.  1:12 PM to [facility] from case management, patient will be provided with a walker to help ambulate at home as has been currently has his right arm on a sling.  Patient stable for discharge.     Janeece Fitting, PA-C 06/17/18 1314    Ward, Delice Bison, DO 06/17/18 2337

## 2018-06-17 NOTE — ED Notes (Signed)
Patient states feeling better since admin of antivert.

## 2018-06-17 NOTE — Discharge Instructions (Addendum)
We have provided 2 sets of medications for you: 1) Antivert, take this medication when you are dizziness is mild to moderate. 2) Valium, take this medication when your dizziness is moderate to severe.  DO NOT take both medications at the same time. Please follow up with PCP as needed.

## 2018-06-17 NOTE — ED Provider Notes (Signed)
[facility] EMERGENCY DEPARTMENT Provider Note   CSN: [id number] Arrival date & time: 06/17/18  0248     History   Chief Complaint Chief Complaint  Patient presents with  . Dizziness  . Gait problems    HPI [name] is a 58 y.o. female.  Patient presents emergency department with a chief complaint of dizziness.  She awoke early this morning with headache and dizziness.  She states that she is unable to walk because of the dizziness.  She denies having had vertigo before.  Denies having had symptoms like this in the past.  She has had migraines before, but states that this is atypical.  She denies any fevers or chills.  States that she did have some nausea, but denies any vomiting.  She has not taken any thing for her symptoms.  The history is provided by the patient. No language interpreter was used.    Past Medical History:  Diagnosis Date  . Abdominal discomfort    Chronic abdominal and pelvic pain resulting in significant loss of time from work  . Anxiety    with depression  . Asthma   . Chest pain   . Diabetes mellitus   . Drug overdose 2009   60 Naprosyn, psychiatric admission  . Hyperlipidemia    Severe  . Hypertension   . Hypothyroidism   . Migraines     Patient Active Problem List   Diagnosis Date Noted  . Diverticulosis 07/13/2016  . Morbid obesity ([place]) 03/31/2015  . Hyperlipidemia   . Hypothyroidism   . Hypertension   . POLYP, COLON 04/13/2010  . Diabetes ([place]) 04/13/2010  . Anxiety state 04/13/2010  . Depression 04/13/2010  . Asthma 04/13/2010  . Migraines 04/13/2010    Past Surgical History:  Procedure Laterality Date  . ABDOMINAL HYSTERECTOMY  02/2007   + lysis of adhesions  . APPENDECTOMY  1990s   ruptured, late 90s  . BILATERAL OOPHORECTOMY      in 2 surgeries prior to 9/08  . COLONOSCOPY  02/2010    normal upper endoscopy, single colonic polyp,  . VENTRAL HERNIA REPAIR     x3     OB History   No  obstetric history on file.      Home Medications    Prior to Admission medications   Medication Sig Start Date End Date Taking? Authorizing Provider  albuterol (PROVENTIL HFA;VENTOLIN HFA) 108 (90 Base) MCG/ACT inhaler Inhale 2 puffs into the lungs every 4 (four) hours as needed for wheezing or shortness of breath. (NEEDS TO BE SEEN) 04/21/18   Hassell Done, Mary-Margaret, FNP  atorvastatin (LIPITOR) 40 MG tablet Take 1 tablet (40 mg total) by mouth daily. 07/16/17   Hassell Done, Mary-Margaret, FNP  benazepril (LOTENSIN) 20 MG tablet TAKE 1 TABLET BY MOUTH EVERY DAY 02/03/18   Hassell Done, Mary-Margaret, FNP  benazepril (LOTENSIN) 20 MG tablet TAKE 1 TABLET BY MOUTH EVERY DAY 02/03/18   Hassell Done, Mary-Margaret, FNP  buPROPion (WELLBUTRIN XL) 150 MG 24 hr tablet TAKE 3 TABLETS (450 MG TOTAL) BY MOUTH DAILY. 05/01/18   Hassell Done Mary-Margaret, FNP  citalopram (CELEXA) 40 MG tablet TAKE 1 TABLET BY MOUTH EVERY DAY 03/06/18   Hassell Done, Mary-Margaret, FNP  EPINEPHrine 0.3 mg/0.3 mL IJ SOAJ injection INJECT 0.3 ML (0.3 MG TOTAL) INTO THE SHOULDER, THIGH, OR BUTTOCKS ONCE FOR 1 DOSE. 11/12/16   [provider]  fenofibrate 160 MG tablet TAKE 1 TABLET (160 MG TOTAL) BY MOUTH DAILY. (NEED TO BE SEEN) 05/01/18  Hassell Done, Mary-Margaret, FNP  glipiZIDE (GLUCOTROL) 10 MG tablet TAKE ONE TABLET BY MOUTH EVERY DAY BEFORE BREAKFAST 02/03/18   Hassell Done, Mary-Margaret, FNP  glipiZIDE (GLUCOTROL) 10 MG tablet TAKE ONE TABLET BY MOUTH EVERY DAY BEFORE BREAKFAST 02/03/18   Hassell Done, Mary-Margaret, FNP  propranolol (INDERAL) 40 MG tablet TAKE 1 TABLET BY MOUTH TWICE A DAY 03/06/18   Hassell Done, Mary-Margaret, FNP  sitaGLIPtin-metformin (JANUMET) 50-1000 MG tablet Take 1 tablet by mouth 2 (two) times daily with a meal. 07/23/17   Hassell Done, Mary-Margaret, FNP  SUMAtriptan (IMITREX) 50 MG tablet Take 1 tablet (50 mg total) by mouth every 2 (two) hours as needed for migraine. May repeat in 2 hours if headache persists or recurs. 10/22/17   Hassell Done  Mary-Margaret, FNP  topiramate (TOPAMAX) 50 MG tablet TAKE 1 TABLET BY MOUTH TWICE A DAY 02/03/18   Hassell Done, Mary-Margaret, FNP  topiramate (TOPAMAX) 50 MG tablet Take 1 tablet (50 mg total) by mouth 2 (two) times daily. 05/16/18   Baruch Gouty, FNP    Family History No family history on file.  Social History Social History   Tobacco Use  . Smoking status: Never Smoker  . Smokeless tobacco: Never Used  Substance Use Topics  . Alcohol use: No  . Drug use: No     Allergies   Aspirin; Bee venom; Benadryl [diphenhydramine hcl]; Morphine and related; and Vicks formula 44 cough-cold pm [dm-apap-cpm]   Review of Systems Review of Systems  All other systems reviewed and are negative.    Physical Exam Updated Vital Signs BP (!) 109/44   Pulse 60   Temp (!) 97.3 F (36.3 C) (Oral)   Resp 20   Ht 5\' 4"  (1.626 m)   Wt 99.8 kg   SpO2 100%   BMI 37.76 kg/m   Physical Exam Vitals signs and nursing note reviewed.  Constitutional:      Appearance: She is well-developed.  HENT:     Head: Normocephalic and atraumatic.  Eyes:     Conjunctiva/sclera: Conjunctivae normal.     Pupils: Pupils are equal, round, and reactive to light.     Comments: No nystagmus noted  Neck:     Musculoskeletal: Normal range of motion and neck supple.  Cardiovascular:     Rate and Rhythm: Normal rate and regular rhythm.     Heart sounds: No murmur. No friction rub. No gallop.   Pulmonary:     Effort: Pulmonary effort is normal. No respiratory distress.     Breath sounds: Normal breath sounds. No wheezing or rales.  Chest:     Chest wall: No tenderness.  Abdominal:     General: Bowel sounds are normal. There is no distension.     Palpations: Abdomen is soft. There is no mass.     Tenderness: There is no abdominal tenderness. There is no guarding or rebound.  Musculoskeletal: Normal range of motion.        General: No tenderness.  Skin:    General: Skin is warm and dry.  Neurological:      Mental Status: She is alert and oriented to person, place, and time.     Comments: CN II-XII intact, speech is clear, movements are goal oriented, normal finger-nose, no pronator drift, unable to stand due to dizziness, but no lower extremity weakness  Psychiatric:        Behavior: Behavior normal.        Thought Content: Thought content normal.        Judgment: Judgment normal.  ED Treatments / Results  Labs (all labs ordered are listed, but only abnormal results are displayed) Labs Reviewed  CBC - Abnormal; Notable for the following components:      Result Value   RBC 3.57 (*)    Hemoglobin 10.3 (*)    HCT 33.2 (*)    All other components within normal limits  DIFFERENTIAL - Abnormal; Notable for the following components:   Abs Immature Granulocytes 0.11 (*)    All other components within normal limits  COMPREHENSIVE METABOLIC PANEL - Abnormal; Notable for the following components:   Chloride 112 (*)    CO2 18 (*)    Glucose, Bld 174 (*)    Creatinine, Ser 1.18 (*)    Calcium 8.4 (*)    Total Protein 5.9 (*)    Albumin 3.4 (*)    ALT 51 (*)    Alkaline Phosphatase 33 (*)    GFR calc non Af Amer 51 (*)    GFR calc Af Amer 59 (*)    All other components within normal limits  URINALYSIS, ROUTINE W REFLEX MICROSCOPIC - Abnormal; Notable for the following components:   APPearance HAZY (*)    Ketones, ur 5 (*)    Bacteria, UA RARE (*)    All other components within normal limits  I-STAT CHEM 8, ED - Abnormal; Notable for the following components:   Chloride 112 (*)    Creatinine, Ser 1.20 (*)    Glucose, Bld 169 (*)    TCO2 18 (*)    Hemoglobin 11.2 (*)    HCT 33.0 (*)    All other components within normal limits  ETHANOL  PROTIME-INR  APTT  RAPID URINE DRUG SCREEN, HOSP PERFORMED  I-STAT TROPONIN, ED  I-STAT BETA HCG BLOOD, ED ([facility], [facility], [facility] ONLY)    EKG EKG Interpretation  Date/Time:  Tuesday June 17 2018 02:52:23 EST Ventricular Rate:  62 PR  Interval:    QRS Duration: 83 QT Interval:  475 QTC Calculation: 483 R Axis:   65 Text Interpretation:  Sinus rhythm Low voltage, precordial leads No significant change since last tracing Confirmed by Ward, Cyril Mourning [phone]) on 06/17/2018 3:00:00 AM   Radiology Ct Head Wo Contrast  Result Date: 06/17/2018 CLINICAL DATA:  Initial evaluation for acute headache, nausea, vertigo. EXAM: CT HEAD WITHOUT CONTRAST TECHNIQUE: Contiguous axial images were obtained from the base of the skull through the vertex without intravenous contrast. COMPARISON:  Prior MRI from 12/04/2012. FINDINGS: Brain: Cerebral volume within normal limits for patient age. No evidence for acute intracranial hemorrhage. No findings to suggest acute large vessel territory infarct. No mass lesion, midline shift, or mass effect. Ventricles are normal in size without evidence for hydrocephalus. No extra-axial fluid collection identified. Vascular: No hyperdense vessel identified. Skull: Scalp soft tissues demonstrate no acute abnormality. Calvarium intact. Sinuses/Orbits: Globes and orbital soft tissues within normal limits. Visualized paranasal sinuses are clear. No mastoid effusion. IMPRESSION: Negative head CT.  No acute intracranial abnormality identified. Electronically Signed   By: Jeannine Boga M.D.   On: 06/17/2018 04:28    Procedures Procedures (including critical care time)  Medications Ordered in ED Medications  LORazepam (ATIVAN) tablet 1 mg (has no administration in time range)  meclizine (ANTIVERT) tablet 25 mg (25 mg Oral Given 06/17/18 0506)     Initial Impression / Assessment and Plan / ED Course  I have reviewed the triage vital signs and the nursing notes.  Pertinent labs & imaging results that were available during my  care of the patient were reviewed by me and considered in my medical decision making (see chart for details).     Patient with sudden onset headache and dizziness.  This awakened her from  sleep.  Will check CT had.  CT head is negative.  As CT was performed within 4 hours, I do not believe that patient has occult subarachnoid hemorrhage, no LP will be performed.  Patient will be given some meclizine and will need an MRI to rule out cerebellar stroke.  Laboratory work-up is reassuring.  Patient signed out to [place], [place], who will continue care and follow-up on MRI.    Anticipate discharge if MRI is negative and patient able to walk.  Final Clinical Impressions(s) / ED Diagnoses   Final diagnoses:  Vertigo    ED Discharge Orders         Ordered    meclizine (ANTIVERT) 25 MG tablet  2 times daily PRN     06/17/18 0601           Montine Circle, PA-C 06/17/18 0601    Ward, Delice Bison, DO 06/17/18 0602

## 2018-06-17 NOTE — ED Notes (Signed)
ekg done

## 2018-06-17 NOTE — ED Notes (Signed)
Pt ambulated to door and back from bed with this tech and [place], [place]. Pt stated "I am still feeling weak, but not as bad as before." Stated the same about her dizziness, adding "it's less constant, it comes and goes but it is better than before." Balance issues noted, gait was slow and steady. PA at bedside with Pt and husband at this time.

## 2018-06-17 NOTE — ED Notes (Signed)
Patient transported to MRI 

## 2018-06-17 NOTE — Progress Notes (Signed)
Discharge instructions (including medications) discussed with and copy provided to patient/caregiver.  Pt given a walker and escorted out via wheelchair.

## 2018-06-17 NOTE — ED Triage Notes (Addendum)
Patient states that she awoke with a headache, nausea and dizziness. A&O x4. LSN @ 01:30. Zofran 4mg , 1300 ml fluid - FSBS was 153.

## 2018-06-17 NOTE — Progress Notes (Signed)
Ambulated pt in room with EDP.  Pt stated she felt dizzy.  Pt returned to stretcher and placed on monitor.

## 2018-06-20 ENCOUNTER — Other Ambulatory Visit: Payer: Self-pay | Admitting: Nurse Practitioner

## 2018-06-20 DIAGNOSIS — E1142 Type 2 diabetes mellitus with diabetic polyneuropathy: Secondary | ICD-10-CM

## 2018-06-20 DIAGNOSIS — F3341 Major depressive disorder, recurrent, in partial remission: Secondary | ICD-10-CM

## 2018-06-22 ENCOUNTER — Other Ambulatory Visit: Payer: Self-pay | Admitting: Nurse Practitioner

## 2018-06-22 DIAGNOSIS — F3341 Major depressive disorder, recurrent, in partial remission: Secondary | ICD-10-CM

## 2018-06-23 NOTE — Telephone Encounter (Signed)
Last seen 10/22/17  MMM

## 2018-07-09 ENCOUNTER — Other Ambulatory Visit: Payer: Self-pay | Admitting: Nurse Practitioner

## 2018-07-09 DIAGNOSIS — I10 Essential (primary) hypertension: Secondary | ICD-10-CM

## 2018-07-09 DIAGNOSIS — E782 Mixed hyperlipidemia: Secondary | ICD-10-CM

## 2018-07-09 DIAGNOSIS — G43711 Chronic migraine without aura, intractable, with status migrainosus: Secondary | ICD-10-CM

## 2018-07-10 ENCOUNTER — Other Ambulatory Visit: Payer: Self-pay | Admitting: Nurse Practitioner

## 2018-07-10 DIAGNOSIS — G43711 Chronic migraine without aura, intractable, with status migrainosus: Secondary | ICD-10-CM

## 2018-07-10 NOTE — Telephone Encounter (Signed)
MMM. NTBS 30 days given 06/17/18

## 2018-07-10 NOTE — Telephone Encounter (Signed)
Apt made

## 2018-07-15 ENCOUNTER — Encounter: Payer: Self-pay | Admitting: Nurse Practitioner

## 2018-07-15 ENCOUNTER — Ambulatory Visit (INDEPENDENT_AMBULATORY_CARE_PROVIDER_SITE_OTHER): Payer: Self-pay | Admitting: Nurse Practitioner

## 2018-07-15 ENCOUNTER — Other Ambulatory Visit: Payer: Self-pay | Admitting: Nurse Practitioner

## 2018-07-15 VITALS — BP 117/57 | HR 55 | Temp 97.3°F | Ht 64.0 in | Wt 224.0 lb

## 2018-07-15 DIAGNOSIS — F411 Generalized anxiety disorder: Secondary | ICD-10-CM

## 2018-07-15 DIAGNOSIS — G43711 Chronic migraine without aura, intractable, with status migrainosus: Secondary | ICD-10-CM

## 2018-07-15 DIAGNOSIS — E1142 Type 2 diabetes mellitus with diabetic polyneuropathy: Secondary | ICD-10-CM

## 2018-07-15 DIAGNOSIS — I10 Essential (primary) hypertension: Secondary | ICD-10-CM

## 2018-07-15 DIAGNOSIS — E039 Hypothyroidism, unspecified: Secondary | ICD-10-CM

## 2018-07-15 DIAGNOSIS — E782 Mixed hyperlipidemia: Secondary | ICD-10-CM

## 2018-07-15 DIAGNOSIS — F3341 Major depressive disorder, recurrent, in partial remission: Secondary | ICD-10-CM

## 2018-07-15 DIAGNOSIS — G43411 Hemiplegic migraine, intractable, with status migrainosus: Secondary | ICD-10-CM

## 2018-07-15 MED ORDER — BUPROPION HCL ER (XL) 150 MG PO TB24: 450.0000 mg | ORAL_TABLET | Freq: Every day | ORAL | 0 refills | Status: DC

## 2018-07-15 MED ORDER — METFORMIN HCL 1000 MG PO TABS: 1000.0000 mg | ORAL_TABLET | Freq: Two times a day (BID) | ORAL | 1 refills | Status: DC

## 2018-07-15 MED ORDER — ATORVASTATIN CALCIUM 40 MG PO TABS: 40.0000 mg | ORAL_TABLET | Freq: Every day | ORAL | 1 refills | Status: DC

## 2018-07-15 MED ORDER — BENAZEPRIL HCL 20 MG PO TABS: 20.0000 mg | ORAL_TABLET | Freq: Every day | ORAL | 0 refills | Status: DC

## 2018-07-15 MED ORDER — FENOFIBRATE 160 MG PO TABS: 160.0000 mg | ORAL_TABLET | Freq: Every day | ORAL | 0 refills | Status: DC

## 2018-07-15 MED ORDER — CITALOPRAM HYDROBROMIDE 40 MG PO TABS: 40.0000 mg | ORAL_TABLET | Freq: Every day | ORAL | 0 refills | Status: DC

## 2018-07-15 MED ORDER — PROPRANOLOL HCL 40 MG PO TABS: 40.0000 mg | ORAL_TABLET | Freq: Two times a day (BID) | ORAL | 0 refills | Status: DC

## 2018-07-15 MED ORDER — GLIPIZIDE 10 MG PO TABS: ORAL_TABLET | ORAL | 0 refills | Status: DC

## 2018-07-15 NOTE — [place] Notes (Signed)
Subjective:    Patient ID: [name], female    DOB: May 23, 1961, 58 y.o.   MRN: [id number]   Chief Complaint: Medical Management of Chronic Issues (Last took [place] first of Jan. Can't afford)   HPI:  1. Type 2 diabetes mellitus with diabetic polyneuropathy, without long-term current use of insulin ([place]) last hgba1c was 5.7. she has not had janumet since January because she does not have insurance.  2. Mixed hyperlipidemia  Tries to avoid all fried foods  3. Essential hypertension  No c/o chest pain, sob or headache.  4. Intractable hemiplegic migraine with status migrainosus Has not had any recent migraines  5. Hypothyroidism, unspecified type  Not having any problems that aware of.  6. Morbid obesity ([place])  Weight is up 7lbs since last visit  7. Recurrent major depressive disorder, in partial remission ([place])  still taking celexa and is working well for her,\. Depression screen [facility] 2/9 07/15/2018 10/22/2017 07/16/2017  Decreased Interest 1 0 0  Down, Depressed, Hopeless 0 0 0  PHQ - 2 Score 1 0 0  Altered sleeping - - -  Tired, decreased energy - - -  Change in appetite - - -  Feeling bad or failure about yourself  - - -  Trouble concentrating - - -  Moving slowly or fidgety/restless - - -  Suicidal thoughts - - -  PHQ-9 Score - - -     8. Anxiety state  Is on wellutrin which works well for her anxiety    Outpatient Encounter Medications as of 07/15/2018  Medication Sig  . albuterol (PROVENTIL HFA;VENTOLIN HFA) 108 (90 Base) MCG/ACT inhaler Inhale 2 puffs into the lungs every 4 (four) hours as needed for wheezing or shortness of breath. (NEEDS TO BE SEEN)  . atorvastatin (LIPITOR) 40 MG tablet Take 1 tablet (40 mg total) by mouth daily.  . benazepril (LOTENSIN) 20 MG tablet Take 1 tablet (20 mg total) by mouth daily. (Needs to be seen before next refill)  . buPROPion (WELLBUTRIN XL) 150 MG 24 hr tablet TAKE 3 TABLETS (450 MG TOTAL) BY MOUTH DAILY.  . citalopram  (CELEXA) 40 MG tablet TAKE 1 TABLET (40 MG TOTAL) BY MOUTH DAILY. (NEEDS TO BE SEEN BEFORE NEXT REFILL)  . EPINEPHrine 0.3 mg/0.3 mL IJ SOAJ injection INJECT 0.3 ML (0.3 MG TOTAL) INTO THE SHOULDER, THIGH, OR BUTTOCKS ONCE FOR 1 DOSE.  . fenofibrate 160 MG tablet TAKE 1 TABLET (160 MG TOTAL) BY MOUTH DAILY. (NEED TO BE SEEN)  . glipiZIDE (GLUCOTROL) 10 MG tablet TAKE ONE TABLET BY MOUTH EVERY DAY BEFORE BREAKFAST (Needs to be seen before next refill)  . meclizine (ANTIVERT) 25 MG tablet Take 1 tablet (25 mg total) by mouth 2 (two) times daily as needed for dizziness.  . propranolol (INDERAL) 40 MG tablet Take 1 tablet (40 mg total) by mouth 2 (two) times daily. (Needs to be seen before next refill)  . sitaGLIPtin-metformin (JANUMET) 50-1000 MG tablet Take 1 tablet by mouth 2 (two) times daily with a meal. (Patient not taking: Reported on 07/15/2018)  . SUMAtriptan (IMITREX) 50 MG tablet Take 1 tablet (50 mg total) by mouth every 2 (two) hours as needed for migraine. May repeat in 2 hours if headache persists or recurs.  . topiramate (TOPAMAX) 50 MG tablet Take 1 tablet (50 mg total) by mouth 2 (two) times daily.  . [DISCONTINUED] topiramate (TOPAMAX) 50 MG tablet TAKE 1 TABLET BY MOUTH TWICE A DAY (Patient not taking: Reported  on 06/17/2018)       New complaints: None today  Social history: Lives with her husband who is on disability. She is curently looking for a job  Review of Systems  Constitutional: Negative for activity change and appetite change.  HENT: Negative.   Eyes: Negative for pain.  Respiratory: Negative for shortness of breath.   Cardiovascular: Negative for chest pain, palpitations and leg swelling.  Gastrointestinal: Negative for abdominal pain.  Endocrine: Negative for polydipsia.  Genitourinary: Negative.   Skin: Negative for rash.  Neurological: Negative for dizziness, weakness and headaches.  Hematological: Does not bruise/bleed easily.  Psychiatric/Behavioral:  Negative.   All other systems reviewed and are negative.      Objective:   Physical Exam Vitals signs and nursing note reviewed.  Constitutional:      General: She is not in acute distress.    Appearance: Normal appearance. She is well-developed.  HENT:     Head: Normocephalic.     Nose: Nose normal.  Eyes:     Pupils: Pupils are equal, round, and reactive to light.  Neck:     Musculoskeletal: Normal range of motion and neck supple.     Vascular: No carotid bruit or JVD.  Cardiovascular:     Rate and Rhythm: Normal rate and regular rhythm.     Heart sounds: Normal heart sounds.  Pulmonary:     Effort: Pulmonary effort is normal. No respiratory distress.     Breath sounds: Normal breath sounds. No wheezing or rales.  Chest:     Chest wall: No tenderness.  Abdominal:     General: Bowel sounds are normal. There is no distension or abdominal bruit.     Palpations: Abdomen is soft. There is no hepatomegaly, splenomegaly, mass or pulsatile mass.     Tenderness: There is no abdominal tenderness.  Musculoskeletal: Normal range of motion.  Lymphadenopathy:     Cervical: No cervical adenopathy.  Skin:    General: Skin is warm and dry.  Neurological:     Mental Status: She is alert and oriented to person, place, and time.     Deep Tendon Reflexes: Reflexes are normal and symmetric.  Psychiatric:        Behavior: Behavior normal.        Thought Content: Thought content normal.        Judgment: Judgment normal.    BP (!) 117/57   Pulse (!) 55   Temp (!) 97.3 F (36.3 C) (Oral)   Ht 5' 4"  (1.626 m)   Wt 224 lb (101.6 kg)   BMI 38.45 kg/m      Assessment & Plan:  [name] comes in today with chief complaint of Medical Management of Chronic Issues (Last took Janumet first of Jan. Can't afford)   Diagnosis and orders addressed:  1. Type 2 diabetes mellitus with diabetic polyneuropathy, without long-term current use of insulin (HCC) Continue to watch carbs in  diet Stopped janumet- changed to metformin 1024m BID - Bayer DCA Hb A1c Waived - glipiZIDE (GLUCOTROL) 10 MG tablet; TAKE ONE TABLET BY MOUTH EVERY DAY BEFORE BREAKFAST (Needs to be seen before next refill)  Dispense: 30 tablet; Refill: 0 - metFORMIN (GLUCOPHAGE) 1000 MG tablet; Take 1 tablet (1,000 mg total) by mouth 2 (two) times daily with a meal.  Dispense: 180 tablet; Refill: 1  2. Mixed hyperlipidemia Low fat diet - atorvastatin (LIPITOR) 40 MG tablet; Take 1 tablet (40 mg total) by mouth daily.  Dispense: 90 tablet;  Refill: 1 - fenofibrate 160 MG tablet; Take 1 tablet (160 mg total) by mouth daily. (Need to be seen)  Dispense: 30 tablet; Refill: 0  3. Essential hypertension Low sodium diet - benazepril (LOTENSIN) 20 MG tablet; Take 1 tablet (20 mg total) by mouth daily. (Needs to be seen before next refill)  Dispense: 30 tablet; Refill: 0 - CMP14+EGFR  4. Intractable hemiplegic migraine with status migrainosus Avoid caffeine  5. Hypothyroidism, unspecified type - Thyroid Panel With TSH  6. Morbid obesity ([place]) Discussed diet and exercise for person with BMI >25 Will recheck weight in 3-6 months  7. Recurrent major depressive disorder, in partial remission (HCC) Stress management - citalopram (CELEXA) 40 MG tablet; Take 1 tablet (40 mg total) by mouth daily. (Needs to be seen before next refill)  Dispense: 90 tablet; Refill: 0  8. Anxiety state - buPROPion (WELLBUTRIN XL) 150 MG 24 hr tablet; Take 3 tablets (450 mg total) by mouth daily.  Dispense: 270 tablet; Refill: 0  9. Intractable chronic migraine without aura and with status migrainosus - propranolol (INDERAL) 40 MG tablet; Take 1 tablet (40 mg total) by mouth 2 (two) times daily. (Needs to be seen before next refill)  Dispense: 60 tablet; Refill: 0   Labs pending Health Maintenance reviewed Diet and exercise encouraged  Follow up plan: 6 months   Mary-Margaret Hassell Done, FNP

## 2018-07-15 NOTE — Addendum Note (Signed)
Addended by: Chevis Pretty on: 07/15/2018 09:46 AM   Modules accepted: Level of Service

## 2018-07-15 NOTE — Patient Instructions (Signed)

## 2018-07-16 LAB — CMP14+EGFR
ALT: 67 IU/L — AB (ref 0–32)
AST: 35 IU/L (ref 0–40)
Albumin/Globulin Ratio: 1.8 (ref 1.2–2.2)
Albumin: 4.2 g/dL (ref 3.8–4.9)
Alkaline Phosphatase: 53 IU/L (ref 39–117)
BUN/Creatinine Ratio: 20 (ref 9–23)
BUN: 24 mg/dL (ref 6–24)
Bilirubin Total: 0.2 mg/dL (ref 0.0–1.2)
CO2: 20 mmol/L (ref 20–29)
Calcium: 9.3 mg/dL (ref 8.7–10.2)
Chloride: 103 mmol/L (ref 96–106)
Creatinine, Ser: 1.21 mg/dL — ABNORMAL HIGH (ref 0.57–1.00)
GFR calc non Af Amer: 50 mL/min/{1.73_m2} — ABNORMAL LOW (ref >59)
GFR, EST AFRICAN AMERICAN: 57 mL/min/{1.73_m2} — AB (ref >59)
GLUCOSE: 213 mg/dL — AB (ref 65–99)
Globulin, Total: 2.3 g/dL (ref 1.5–4.5)
Potassium: 4.6 mmol/L (ref 3.5–5.2)
Sodium: 136 mmol/L (ref 134–144)
Total Protein: 6.5 g/dL (ref 6.0–8.5)

## 2018-07-16 LAB — THYROID PANEL WITH TSH
Free Thyroxine Index: 1.5 (ref 1.2–4.9)
T3 Uptake Ratio: 24 % (ref 24–39)
T4, Total: 6.1 ug/dL (ref 4.5–12.0)
TSH: 3.7 u[IU]/mL (ref 0.450–4.500)

## 2018-07-16 LAB — BAYER DCA HB A1C WAIVED: HB A1C: 7 % — AB (ref <7.0)

## 2018-08-13 ENCOUNTER — Other Ambulatory Visit: Payer: Self-pay | Admitting: Nurse Practitioner

## 2018-08-13 DIAGNOSIS — G43711 Chronic migraine without aura, intractable, with status migrainosus: Secondary | ICD-10-CM

## 2018-08-13 DIAGNOSIS — I10 Essential (primary) hypertension: Secondary | ICD-10-CM

## 2018-09-27 ENCOUNTER — Other Ambulatory Visit: Payer: Self-pay | Admitting: Family Medicine

## 2018-09-27 DIAGNOSIS — G43711 Chronic migraine without aura, intractable, with status migrainosus: Secondary | ICD-10-CM

## 2018-10-12 ENCOUNTER — Other Ambulatory Visit: Payer: Self-pay | Admitting: Nurse Practitioner

## 2018-10-12 DIAGNOSIS — F411 Generalized anxiety disorder: Secondary | ICD-10-CM

## 2018-11-07 ENCOUNTER — Other Ambulatory Visit: Payer: Self-pay | Admitting: Nurse Practitioner

## 2018-11-07 DIAGNOSIS — F3341 Major depressive disorder, recurrent, in partial remission: Secondary | ICD-10-CM

## 2018-12-11 ENCOUNTER — Encounter: Payer: Self-pay | Admitting: Nurse Practitioner

## 2018-12-11 ENCOUNTER — Ambulatory Visit (INDEPENDENT_AMBULATORY_CARE_PROVIDER_SITE_OTHER): Payer: Self-pay | Admitting: Nurse Practitioner

## 2018-12-11 ENCOUNTER — Other Ambulatory Visit: Payer: Self-pay

## 2018-12-11 ENCOUNTER — Telehealth: Payer: Self-pay | Admitting: Nurse Practitioner

## 2018-12-11 DIAGNOSIS — R059 Cough, unspecified: Secondary | ICD-10-CM

## 2018-12-11 DIAGNOSIS — K219 Gastro-esophageal reflux disease without esophagitis: Secondary | ICD-10-CM

## 2018-12-11 DIAGNOSIS — R05 Cough: Secondary | ICD-10-CM

## 2018-12-11 MED ORDER — PANTOPRAZOLE SODIUM 40 MG PO TBEC: 40.0000 mg | DELAYED_RELEASE_TABLET | Freq: Every day | ORAL | 3 refills | Status: DC

## 2018-12-11 NOTE — [place] Notes (Signed)
   Virtual Visit via telephone Note  I connected with [name] on 12/11/18 at 11:20 by telephone and verified that I am speaking with the correct person using two identifiers. [name] is currently located at home and her husband is currently with her during visit. The provider, Mary-Margaret Hassell Done, FNP is located in their office at time of visit.  I discussed the limitations, risks, security and privacy concerns of performing an evaluation and management service by telephone and the availability of in person appointments. I also discussed with the patient that there may be a patient responsible charge related to this service. The patient expressed understanding and agreed to proceed.   History and Present Illness:   Chief Complaint: Allergies   HPI Patient calls in today stating that she has been having problems with allergies. Causes her to cough every morning. Has some blood in her phlegm at times. Denies fever. She has runny nose . She has occasional heartburn and just takes tums when she needs it.   Review of Systems  Constitutional: Negative for diaphoresis, fever and weight loss.  HENT: Negative for congestion, hearing loss, sinus pain and sore throat.   Eyes: Negative for blurred vision, double vision and pain.  Respiratory: Positive for cough (only in mornings). Negative for shortness of breath.   Cardiovascular: Negative for chest pain, palpitations, orthopnea and leg swelling.  Gastrointestinal: Negative for abdominal pain.  Skin: Negative for rash.  Neurological: Negative for dizziness, sensory change, loss of consciousness, weakness and headaches.  Endo/Heme/Allergies: Negative for polydipsia. Does not bruise/bleed easily.  Psychiatric/Behavioral: Negative for memory loss. The patient does not have insomnia.   All other systems reviewed and are negative.    Observations/Objective: Alert and oriented- answers all questions apropriately No  distress  Assessment and Plan: [name] in today with chief complaint of Allergies   1. Gastroesophageal reflux disease without esophagitis Meds ordered this encounter  Medications  . pantoprazole (PROTONIX) 40 MG tablet    Sig: Take 1 tablet (40 mg total) by mouth daily.    Dispense:  30 tablet    Refill:  3    Order Specific Question:   Supervising Provider    Answer:   Caryl Pina A [[id number]]   Avoid spicy foods Do not eat 2 hours prior to bedtime   2. Cough mucinex for several days to see if helps   Follow Up Instructions: prn    I discussed the assessment and treatment plan with the patient. The patient was provided an opportunity to ask questions and all were answered. The patient agreed with the plan and demonstrated an understanding of the instructions.   The patient was advised to call back or seek an in-person evaluation if the symptoms worsen or if the condition fails to improve as anticipated.  The above assessment and management plan was discussed with the patient. The patient verbalized understanding of and has agreed to the management plan. Patient is aware to call the clinic if symptoms persist or worsen. Patient is aware when to return to the clinic for a follow-up visit. Patient educated on when it is appropriate to go to the emergency department.   Time call ended:  11:30  I provided 10 minutes of non-face-to-face time during this encounter.    Mary-Margaret Hassell Done, FNP

## 2018-12-11 NOTE — Telephone Encounter (Signed)
Patient has been coughing up phlegm for a few months. Televisit made for today

## 2018-12-29 ENCOUNTER — Other Ambulatory Visit: Payer: Self-pay | Admitting: Family Medicine

## 2018-12-29 DIAGNOSIS — G43711 Chronic migraine without aura, intractable, with status migrainosus: Secondary | ICD-10-CM

## 2019-01-14 ENCOUNTER — Other Ambulatory Visit: Payer: Self-pay

## 2019-01-15 ENCOUNTER — Encounter: Payer: Self-pay | Admitting: Nurse Practitioner

## 2019-01-15 ENCOUNTER — Ambulatory Visit (INDEPENDENT_AMBULATORY_CARE_PROVIDER_SITE_OTHER): Payer: Self-pay | Admitting: Nurse Practitioner

## 2019-01-15 VITALS — BP 144/94 | HR 54 | Temp 96.4°F | Ht 64.0 in | Wt 228.0 lb

## 2019-01-15 DIAGNOSIS — F411 Generalized anxiety disorder: Secondary | ICD-10-CM

## 2019-01-15 DIAGNOSIS — G43711 Chronic migraine without aura, intractable, with status migrainosus: Secondary | ICD-10-CM

## 2019-01-15 DIAGNOSIS — E039 Hypothyroidism, unspecified: Secondary | ICD-10-CM

## 2019-01-15 DIAGNOSIS — I10 Essential (primary) hypertension: Secondary | ICD-10-CM

## 2019-01-15 DIAGNOSIS — E782 Mixed hyperlipidemia: Secondary | ICD-10-CM

## 2019-01-15 DIAGNOSIS — G43411 Hemiplegic migraine, intractable, with status migrainosus: Secondary | ICD-10-CM

## 2019-01-15 DIAGNOSIS — E1142 Type 2 diabetes mellitus with diabetic polyneuropathy: Secondary | ICD-10-CM

## 2019-01-15 DIAGNOSIS — K5790 Diverticulosis of intestine, part unspecified, without perforation or abscess without bleeding: Secondary | ICD-10-CM

## 2019-01-15 DIAGNOSIS — F3341 Major depressive disorder, recurrent, in partial remission: Secondary | ICD-10-CM

## 2019-01-15 LAB — BAYER DCA HB A1C WAIVED: HB A1C (BAYER DCA - WAIVED): 8.9 % — ABNORMAL HIGH (ref <7.0)

## 2019-01-15 MED ORDER — PROPRANOLOL HCL 40 MG PO TABS: 40.0000 mg | ORAL_TABLET | Freq: Two times a day (BID) | ORAL | 1 refills | Status: DC

## 2019-01-15 MED ORDER — GLIPIZIDE 10 MG PO TABS: ORAL_TABLET | ORAL | 1 refills | Status: DC

## 2019-01-15 MED ORDER — METFORMIN HCL 1000 MG PO TABS: 1000.0000 mg | ORAL_TABLET | Freq: Two times a day (BID) | ORAL | 1 refills | Status: DC

## 2019-01-15 MED ORDER — PANTOPRAZOLE SODIUM 40 MG PO TBEC: 40.0000 mg | DELAYED_RELEASE_TABLET | Freq: Every day | ORAL | 3 refills | Status: DC

## 2019-01-15 MED ORDER — ATORVASTATIN CALCIUM 40 MG PO TABS: 40.0000 mg | ORAL_TABLET | Freq: Every day | ORAL | 1 refills | Status: DC

## 2019-01-15 MED ORDER — FENOFIBRATE 160 MG PO TABS: 160.0000 mg | ORAL_TABLET | Freq: Every day | ORAL | 0 refills | Status: DC

## 2019-01-15 MED ORDER — CITALOPRAM HYDROBROMIDE 40 MG PO TABS: 40.0000 mg | ORAL_TABLET | Freq: Every day | ORAL | 0 refills | Status: DC

## 2019-01-15 MED ORDER — BENAZEPRIL HCL 20 MG PO TABS: 20.0000 mg | ORAL_TABLET | Freq: Every day | ORAL | 1 refills | Status: DC

## 2019-01-15 MED ORDER — SITAGLIPTIN PHOSPHATE 100 MG PO TABS: 100.0000 mg | ORAL_TABLET | Freq: Every day | ORAL | 1 refills | Status: DC

## 2019-01-15 MED ORDER — TOPIRAMATE 50 MG PO TABS: 50.0000 mg | ORAL_TABLET | Freq: Two times a day (BID) | ORAL | 0 refills | Status: DC

## 2019-01-15 NOTE — [place] Notes (Signed)
Subjective:    Patient ID: [name], female    DOB: 1960-07-16, 58 y.o.   MRN: [id number]   Chief Complaint: medical management of chronic issues   HPI:  1. Essential hypertension No c/o chest pain, or SOB. Does not check blood pressure at home very often. BP Readings from Last 3 Encounters:  07/15/18 (!) 117/57  06/17/18 (!) 110/59  10/22/17 119/72     2. Mixed hyperlipidemia Does not watch diet and does very little exercise. Does take lipitor and fenofibrate when she can afford to get it.   3. Type 2 diabetes mellitus with diabetic polyneuropathy, without long-term current use of insulin (HCC) Last hgab1c was 7.0. she doe snot check blood sugars daily because of the expense of test strips. Patient stopped her metformin because of diarrhea and diarrhea has stopped.  4. Intractable hemiplegic migraine with status migrainosus Avoid caffeine  5. Hypothyroidism, unspecified type No problems that she is aware of.  6. Diverticulosis no recent diverticulitis- she tries to avoid foods that cause exacerbation.  7. Anxiety state celexa and wellbutrin work well for her.  8. Recurrent major depressive disorder, in partial remission ([place]) Again she is on celexa and wellbutrin and is doing well. She dneis any medication side effects.  9. Morbid obesity ([place]) No recent weight chnages    Outpatient Encounter Medications as of 01/15/2019  Medication Sig   albuterol (PROVENTIL HFA;VENTOLIN HFA) 108 (90 Base) MCG/ACT inhaler Inhale 2 puffs into the lungs every 4 (four) hours as needed for wheezing or shortness of breath. (NEEDS TO BE SEEN)   atorvastatin (LIPITOR) 40 MG tablet Take 1 tablet (40 mg total) by mouth daily.   benazepril (LOTENSIN) 20 MG tablet Take 1 tablet (20 mg total) by mouth daily.   buPROPion (WELLBUTRIN XL) 150 MG 24 hr tablet TAKE 3 TABLETS (450 MG TOTAL) BY MOUTH DAILY.   citalopram (CELEXA) 40 MG tablet TAKE 1 TABLET (40 MG TOTAL) BY MOUTH  DAILY. (NEEDS TO BE SEEN BEFORE NEXT REFILL)   EPINEPHrine 0.3 mg/0.3 mL IJ SOAJ injection INJECT 0.3 ML (0.3 MG TOTAL) INTO THE SHOULDER, THIGH, OR BUTTOCKS ONCE FOR 1 DOSE.   fenofibrate 160 MG tablet Take 1 tablet (160 mg total) by mouth daily. (Need to be seen)   glipiZIDE (GLUCOTROL) 10 MG tablet TAKE ONE TABLET BY MOUTH EVERY DAY BEFORE BREAKFAST   meclizine (ANTIVERT) 25 MG tablet Take 1 tablet (25 mg total) by mouth 2 (two) times daily as needed for dizziness.   metFORMIN (GLUCOPHAGE) 1000 MG tablet Take 1 tablet (1,000 mg total) by mouth 2 (two) times daily with a meal.   pantoprazole (PROTONIX) 40 MG tablet Take 1 tablet (40 mg total) by mouth daily.   propranolol (INDERAL) 40 MG tablet Take 1 tablet (40 mg total) by mouth 2 (two) times daily.   SUMAtriptan (IMITREX) 50 MG tablet Take 1 tablet (50 mg total) by mouth every 2 (two) hours as needed for migraine. May repeat in 2 hours if headache persists or recurs.   topiramate (TOPAMAX) 50 MG tablet TAKE 1 TABLET BY MOUTH TWICE A DAY    Past Surgical History:  Procedure Laterality Date   ABDOMINAL HYSTERECTOMY  02/2007   + lysis of adhesions   APPENDECTOMY  1990s   ruptured, late 90s   BILATERAL OOPHORECTOMY      in 2 surgeries prior to 9/08   COLONOSCOPY  02/2010    normal upper endoscopy, single colonic polyp,   VENTRAL HERNIA  REPAIR     x3    History reviewed. No pertinent family history.  New complaints: None today  Social history: Is currently unemployed and is has been looking for a job for several months.  Controlled substance contract: N/A    Review of Systems  Constitutional: Negative for activity change and appetite change.  HENT: Negative.   Eyes: Negative for pain.  Respiratory: Negative for shortness of breath.   Cardiovascular: Negative for chest pain, palpitations and leg swelling.  Gastrointestinal: Negative for abdominal pain.  Endocrine: Negative for polydipsia.  Genitourinary:  Negative.   Skin: Negative for rash.  Neurological: Positive for headaches. Negative for dizziness and weakness.  Hematological: Does not bruise/bleed easily.  Psychiatric/Behavioral: Negative.   All other systems reviewed and are negative.      Objective:   Physical Exam Vitals signs and nursing note reviewed.  Constitutional:      General: She is not in acute distress.    Appearance: Normal appearance. She is well-developed.  HENT:     Head: Normocephalic.     Nose: Nose normal.  Eyes:     Pupils: Pupils are equal, round, and reactive to light.  Neck:     Musculoskeletal: Normal range of motion and neck supple.     Vascular: No carotid bruit or JVD.  Cardiovascular:     Rate and Rhythm: Normal rate and regular rhythm.     Heart sounds: Normal heart sounds.  Pulmonary:     Effort: Pulmonary effort is normal. No respiratory distress.     Breath sounds: Normal breath sounds. No wheezing or rales.  Chest:     Chest wall: No tenderness.  Abdominal:     General: Bowel sounds are normal. There is no distension or abdominal bruit.     Palpations: Abdomen is soft. There is no hepatomegaly, splenomegaly, mass or pulsatile mass.     Tenderness: There is no abdominal tenderness.  Musculoskeletal: Normal range of motion.  Lymphadenopathy:     Cervical: No cervical adenopathy.  Skin:    General: Skin is warm and dry.  Neurological:     Mental Status: She is alert and oriented to person, place, and time.     Deep Tendon Reflexes: Reflexes are normal and symmetric.  Psychiatric:        Behavior: Behavior normal.        Thought Content: Thought content normal.        Judgment: Judgment normal.    BP (!) 144/94    Pulse (!) 54    Temp (!) 96.4 F (35.8 C) (Oral)    Ht 5' 4"  (1.626 m)    Wt 228 lb (103.4 kg)    BMI 39.14 kg/m    HGBA1c 8.9      Assessment & Plan:  [name] comes in today with chief complaint of Medical Management of Chronic Issues   Diagnosis  and orders addressed:  1. Essential hypertension Low sodium diet - CMP14+EGFR - benazepril (LOTENSIN) 20 MG tablet; Take 1 tablet (20 mg total) by mouth daily.  Dispense: 90 tablet; Refill: 1  2. Mixed hyperlipidemia Low fat diet - Lipid panel - atorvastatin (LIPITOR) 40 MG tablet; Take 1 tablet (40 mg total) by mouth daily. (Patient not taking: Reported on 01/15/2019)  Dispense: 90 tablet; Refill: 1 - fenofibrate 160 MG tablet; Take 1 tablet (160 mg total) by mouth daily. (Need to be seen) (Patient not taking: Reported on 01/15/2019)  Dispense: 30 tablet; Refill: 0  3. Type 2 diabetes mellitus with diabetic polyneuropathy, without long-term current use of insulin (HCC) Stricter carb counting Added januvia 125m back to meds Continue to hold metformin - Bayer DCA Hb A1c Waived - glipiZIDE (GLUCOTROL) 10 MG tablet; TAKE ONE TABLET BY MOUTH EVERY DAY BEFORE BREAKFAST  Dispense: 90 tablet; Refill: 1  4. Intractable hemiplegic migraine with status migrainosus  5. Hypothyroidism, unspecified type - Thyroid Panel With TSH  6. Diverticulosis Watch diet  7. Anxiety state Stress management  8. Recurrent major depressive disorder, in partial remission (HCC) - citalopram (CELEXA) 40 MG tablet; Take 1 tablet (40 mg total) by mouth daily. (Needs to be seen before next refill)  Dispense: 90 tablet; Refill: 0  9. Morbid obesity ([place] Discussed diet and exercise for person with BMI >25 Will recheck weight in 3-6 months  10. Intractable chronic migraine without aura and with status migrainosus  topiramate (TOPAMAX) 50 MG tablet; Take 1 tablet (50 mg total) by mouth 2 (two) times daily.  Dispense: 180 tablet; Refill: 0 - propranolol (INDERAL) 40 MG tablet; Take 1 tablet (40 mg total) by mouth 2 (two) times daily.  Dispense: 180 tablet; Refill: 1   Labs pending Health Maintenance reviewed Diet and exercise encouraged  Follow up plan: 3 months   [name] Done FNP

## 2019-01-15 NOTE — Patient Instructions (Signed)
Diabetes Mellitus and Foot Care Foot care is an important part of your health, especially when you have diabetes. Diabetes may cause you to have problems because of poor blood flow (circulation) to your feet and legs, which can cause your skin to:  Become thinner and drier.  Break more easily.  Heal more slowly.  Peel and crack. You may also have nerve damage (neuropathy) in your legs and feet, causing decreased feeling in them. This means that you may not notice minor injuries to your feet that could lead to more serious problems. Noticing and addressing any potential problems early is the best way to prevent future foot problems. How to care for your feet Foot hygiene  Wash your feet daily with warm water and mild soap. Do not use hot water. Then, pat your feet and the areas between your toes until they are completely dry. Do not soak your feet as this can dry your skin.  Trim your toenails straight across. Do not dig under them or around the cuticle. File the edges of your nails with an emery board or nail file.  Apply a moisturizing lotion or petroleum jelly to the skin on your feet and to dry, brittle toenails. Use lotion that does not contain alcohol and is unscented. Do not apply lotion between your toes. Shoes and socks  Wear clean socks or stockings every day. Make sure they are not too tight. Do not wear knee-high stockings since they may decrease blood flow to your legs.  Wear shoes that fit properly and have enough cushioning. Always look in your shoes before you put them on to be sure there are no objects inside.  To break in new shoes, wear them for just a few hours a day. This prevents injuries on your feet. Wounds, scrapes, corns, and calluses  Check your feet daily for blisters, cuts, bruises, sores, and redness. If you cannot see the bottom of your feet, use a mirror or ask someone for help.  Do not cut corns or calluses or try to remove them with medicine.  If you  find a minor scrape, cut, or break in the skin on your feet, keep it and the skin around it clean and dry. You may clean these areas with mild soap and water. Do not clean the area with peroxide, alcohol, or iodine.  If you have a wound, scrape, corn, or callus on your foot, look at it several times a day to make sure it is healing and not infected. Check for: ? Redness, swelling, or pain. ? Fluid or blood. ? Warmth. ? Pus or a bad smell. General instructions  Do not cross your legs. This may decrease blood flow to your feet.  Do not use heating pads or hot water bottles on your feet. They may burn your skin. If you have lost feeling in your feet or legs, you may not know this is happening until it is too late.  Protect your feet from hot and cold by wearing shoes, such as at the beach or on hot pavement.  Schedule a complete foot exam at least once a year (annually) or more often if you have foot problems. If you have foot problems, report any cuts, sores, or bruises to your health care provider immediately. Contact a health care provider if:  You have a medical condition that increases your risk of infection and you have any cuts, sores, or bruises on your feet.  You have an injury that is not   healing.  You have redness on your legs or feet.  You feel burning or tingling in your legs or feet.  You have pain or cramps in your legs and feet.  Your legs or feet are numb.  Your feet always feel cold.  You have pain around a toenail. Get help right away if:  You have a wound, scrape, corn, or callus on your foot and: ? You have pain, swelling, or redness that gets worse. ? You have fluid or blood coming from the wound, scrape, corn, or callus. ? Your wound, scrape, corn, or callus feels warm to the touch. ? You have pus or a bad smell coming from the wound, scrape, corn, or callus. ? You have a fever. ? You have a red line going up your leg. Summary  Check your feet every day  for cuts, sores, red spots, swelling, and blisters.  Moisturize feet and legs daily.  Wear shoes that fit properly and have enough cushioning.  If you have foot problems, report any cuts, sores, or bruises to your health care provider immediately.  Schedule a complete foot exam at least once a year (annually) or more often if you have foot problems. This information is not intended to replace advice given to you by your health care provider. Make sure you discuss any questions you have with your health care provider. Document Released: 05/25/2000 Document Revised: 07/10/2017 Document Reviewed: 06/29/2016 Elsevier Patient Education  2020 [employer]  

## 2019-01-16 LAB — THYROID PANEL WITH TSH
Free Thyroxine Index: 1.4 (ref 1.2–4.9)
T3 Uptake Ratio: 23 % — ABNORMAL LOW (ref 24–39)
T4, Total: 6.1 ug/dL (ref 4.5–12.0)
TSH: 3.53 u[IU]/mL (ref 0.450–4.500)

## 2019-01-16 LAB — CMP14+EGFR
ALT: 47 IU/L — ABNORMAL HIGH (ref 0–32)
AST: 24 IU/L (ref 0–40)
Albumin/Globulin Ratio: 2 (ref 1.2–2.2)
Albumin: 4.4 g/dL (ref 3.8–4.9)
Alkaline Phosphatase: 77 IU/L (ref 39–117)
BUN/Creatinine Ratio: 20 (ref 9–23)
BUN: 22 mg/dL (ref 6–24)
Bilirubin Total: 0.3 mg/dL (ref 0.0–1.2)
CO2: 22 mmol/L (ref 20–29)
Calcium: 9.8 mg/dL (ref 8.7–10.2)
Chloride: 100 mmol/L (ref 96–106)
Creatinine, Ser: 1.1 mg/dL — ABNORMAL HIGH (ref 0.57–1.00)
GFR calc Af Amer: 64 mL/min/{1.73_m2} (ref >59)
GFR calc non Af Amer: 55 mL/min/{1.73_m2} — ABNORMAL LOW (ref >59)
Globulin, Total: 2.2 g/dL (ref 1.5–4.5)
Glucose: 260 mg/dL — ABNORMAL HIGH (ref 65–99)
Potassium: 4.8 mmol/L (ref 3.5–5.2)
Sodium: 136 mmol/L (ref 134–144)
Total Protein: 6.6 g/dL (ref 6.0–8.5)

## 2019-01-16 LAB — LIPID PANEL
Chol/HDL Ratio: 9.9 ratio — ABNORMAL HIGH (ref 0.0–4.4)
Cholesterol, Total: 388 mg/dL — ABNORMAL HIGH (ref 100–199)
HDL: 39 mg/dL — ABNORMAL LOW (ref >39)
Triglycerides: 474 mg/dL — ABNORMAL HIGH (ref 0–149)

## 2019-02-09 ENCOUNTER — Other Ambulatory Visit: Payer: Self-pay | Admitting: Nurse Practitioner

## 2019-02-09 DIAGNOSIS — F3341 Major depressive disorder, recurrent, in partial remission: Secondary | ICD-10-CM

## 2019-02-12 ENCOUNTER — Other Ambulatory Visit: Payer: Self-pay | Admitting: Nurse Practitioner

## 2019-02-12 DIAGNOSIS — E782 Mixed hyperlipidemia: Secondary | ICD-10-CM

## 2019-02-12 DIAGNOSIS — F411 Generalized anxiety disorder: Secondary | ICD-10-CM

## 2019-04-15 ENCOUNTER — Other Ambulatory Visit: Payer: Self-pay | Admitting: Nurse Practitioner

## 2019-04-15 DIAGNOSIS — G43711 Chronic migraine without aura, intractable, with status migrainosus: Secondary | ICD-10-CM

## 2019-04-15 DIAGNOSIS — F411 Generalized anxiety disorder: Secondary | ICD-10-CM

## 2019-04-20 ENCOUNTER — Ambulatory Visit: Payer: Self-pay | Admitting: Nurse Practitioner

## 2019-04-25 ENCOUNTER — Emergency Department (HOSPITAL_COMMUNITY): Payer: No Typology Code available for payment source

## 2019-04-25 ENCOUNTER — Encounter (HOSPITAL_COMMUNITY): Payer: Self-pay | Admitting: Emergency Medicine

## 2019-04-25 ENCOUNTER — Emergency Department (HOSPITAL_COMMUNITY)
Admission: EM | Admit: 2019-04-25 | Discharge: 2019-04-25 | Disposition: A | Discharge status: Home / Self Care | Payer: No Typology Code available for payment source | Attending: Emergency Medicine | Admitting: Emergency Medicine

## 2019-04-25 ENCOUNTER — Other Ambulatory Visit: Payer: Self-pay

## 2019-04-25 DIAGNOSIS — S8002XA Contusion of left knee, initial encounter: Secondary | ICD-10-CM | POA: Diagnosis not present

## 2019-04-25 DIAGNOSIS — S161XXA Strain of muscle, fascia and tendon at neck level, initial encounter: Secondary | ICD-10-CM | POA: Diagnosis not present

## 2019-04-25 DIAGNOSIS — I10 Essential (primary) hypertension: Secondary | ICD-10-CM | POA: Diagnosis not present

## 2019-04-25 DIAGNOSIS — Z79899 Other long term (current) drug therapy: Secondary | ICD-10-CM | POA: Insufficient documentation

## 2019-04-25 DIAGNOSIS — Y999 Unspecified external cause status: Secondary | ICD-10-CM | POA: Diagnosis not present

## 2019-04-25 DIAGNOSIS — Z7984 Long term (current) use of oral hypoglycemic drugs: Secondary | ICD-10-CM | POA: Insufficient documentation

## 2019-04-25 DIAGNOSIS — Y93I9 Activity, other involving external motion: Secondary | ICD-10-CM | POA: Insufficient documentation

## 2019-04-25 DIAGNOSIS — S5012XA Contusion of left forearm, initial encounter: Secondary | ICD-10-CM | POA: Diagnosis not present

## 2019-04-25 DIAGNOSIS — R109 Unspecified abdominal pain: Secondary | ICD-10-CM | POA: Diagnosis present

## 2019-04-25 DIAGNOSIS — E119 Type 2 diabetes mellitus without complications: Secondary | ICD-10-CM | POA: Insufficient documentation

## 2019-04-25 DIAGNOSIS — E039 Hypothyroidism, unspecified: Secondary | ICD-10-CM | POA: Insufficient documentation

## 2019-04-25 DIAGNOSIS — Y92410 Unspecified street and highway as the place of occurrence of the external cause: Secondary | ICD-10-CM | POA: Insufficient documentation

## 2019-04-25 DIAGNOSIS — R410 Disorientation, unspecified: Secondary | ICD-10-CM | POA: Diagnosis not present

## 2019-04-25 LAB — COMPREHENSIVE METABOLIC PANEL
ALT: 55 U/L — ABNORMAL HIGH (ref 0–44)
AST: 45 U/L — ABNORMAL HIGH (ref 15–41)
Albumin: 4 g/dL (ref 3.5–5.0)
Alkaline Phosphatase: 59 U/L (ref 38–126)
Anion gap: 10 (ref 5–15)
BUN: 20 mg/dL (ref 6–20)
CO2: 22 mmol/L (ref 22–32)
Calcium: 9.6 mg/dL (ref 8.9–10.3)
Chloride: 104 mmol/L (ref 98–111)
Creatinine, Ser: 0.99 mg/dL (ref 0.44–1.00)
GFR calc Af Amer: >60 mL/min (ref >60)
GFR calc non Af Amer: >60 mL/min (ref >60)
Glucose, Bld: 310 mg/dL — ABNORMAL HIGH (ref 70–99)
Potassium: 4.5 mmol/L (ref 3.5–5.1)
Sodium: 136 mmol/L (ref 135–145)
Total Bilirubin: 0.4 mg/dL (ref 0.3–1.2)
Total Protein: 7.6 g/dL (ref 6.5–8.1)

## 2019-04-25 LAB — CBC WITH DIFFERENTIAL/PLATELET
Abs Immature Granulocytes: 0.08 10*3/uL — ABNORMAL HIGH (ref 0.00–0.07)
Basophils Absolute: 0 10*3/uL (ref 0.0–0.1)
Basophils Relative: 1 %
Eosinophils Absolute: 0.1 10*3/uL (ref 0.0–0.5)
Eosinophils Relative: 2 %
HCT: 36.8 % (ref 36.0–46.0)
Hemoglobin: 11.9 g/dL — ABNORMAL LOW (ref 12.0–15.0)
Immature Granulocytes: 1 %
Lymphocytes Relative: 17 %
Lymphs Abs: 1.2 10*3/uL (ref 0.7–4.0)
MCH: 29.5 pg (ref 26.0–34.0)
MCHC: 32.3 g/dL (ref 30.0–36.0)
MCV: 91.1 fL (ref 80.0–100.0)
Monocytes Absolute: 0.4 10*3/uL (ref 0.1–1.0)
Monocytes Relative: 6 %
Neutro Abs: 5.2 10*3/uL (ref 1.7–7.7)
Neutrophils Relative %: 73 %
Platelets: 206 10*3/uL (ref 150–400)
RBC: 4.04 MIL/uL (ref 3.87–5.11)
RDW: 12.9 % (ref 11.5–15.5)
WBC: 7.1 10*3/uL (ref 4.0–10.5)
nRBC: 0 % (ref 0.0–0.2)

## 2019-04-25 LAB — ETHANOL: Alcohol, Ethyl (B): <10 mg/dL (ref <10)

## 2019-04-25 MED ORDER — IOHEXOL 300 MG/ML  SOLN
100.0000 mL | Freq: Once | INTRAMUSCULAR | Status: AC | PRN
  Administered 2019-04-25: 100 mL via INTRAVENOUS

## 2019-04-25 MED ORDER — FENTANYL CITRATE (PF) 100 MCG/2ML IJ SOLN
50.0000 ug | INTRAMUSCULAR | Status: DC | PRN
  Administered 2019-04-25: 50 ug via INTRAVENOUS
  Filled 2019-04-25: qty 2

## 2019-04-25 MED ORDER — HYDROCODONE-ACETAMINOPHEN 5-325 MG PO TABS: 2.0000 | ORAL_TABLET | ORAL | 0 refills | Status: DC | PRN
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
## 2019-04-25 NOTE — Discharge Instructions (Addendum)
Use Tylenol and ice as needed for pain. Follow-up with your doctor as needed

## 2019-04-27 ENCOUNTER — Other Ambulatory Visit: Payer: Self-pay

## 2019-04-28 ENCOUNTER — Encounter: Payer: Self-pay | Admitting: Nurse Practitioner

## 2019-04-28 ENCOUNTER — Ambulatory Visit (INDEPENDENT_AMBULATORY_CARE_PROVIDER_SITE_OTHER): Payer: Self-pay | Admitting: Nurse Practitioner

## 2019-04-28 VITALS — BP 114/63 | HR 61 | Temp 98.0°F | Resp 20 | Ht 64.0 in | Wt 231.0 lb

## 2019-04-28 DIAGNOSIS — E1142 Type 2 diabetes mellitus with diabetic polyneuropathy: Secondary | ICD-10-CM

## 2019-04-28 DIAGNOSIS — K5790 Diverticulosis of intestine, part unspecified, without perforation or abscess without bleeding: Secondary | ICD-10-CM

## 2019-04-28 DIAGNOSIS — E782 Mixed hyperlipidemia: Secondary | ICD-10-CM

## 2019-04-28 DIAGNOSIS — Z23 Encounter for immunization: Secondary | ICD-10-CM

## 2019-04-28 DIAGNOSIS — F3341 Major depressive disorder, recurrent, in partial remission: Secondary | ICD-10-CM

## 2019-04-28 DIAGNOSIS — G43711 Chronic migraine without aura, intractable, with status migrainosus: Secondary | ICD-10-CM

## 2019-04-28 DIAGNOSIS — I10 Essential (primary) hypertension: Secondary | ICD-10-CM

## 2019-04-28 DIAGNOSIS — G43411 Hemiplegic migraine, intractable, with status migrainosus: Secondary | ICD-10-CM

## 2019-04-28 DIAGNOSIS — F411 Generalized anxiety disorder: Secondary | ICD-10-CM

## 2019-04-28 DIAGNOSIS — E039 Hypothyroidism, unspecified: Secondary | ICD-10-CM

## 2019-04-28 LAB — BAYER DCA HB A1C WAIVED: HB A1C (BAYER DCA - WAIVED): 9.5 % — ABNORMAL HIGH (ref <7.0)

## 2019-04-28 MED ORDER — TOPIRAMATE 50 MG PO TABS: 50.0000 mg | ORAL_TABLET | Freq: Two times a day (BID) | ORAL | 1 refills | Status: DC

## 2019-04-28 MED ORDER — GLIPIZIDE ER 10 MG PO TB24: 10.0000 mg | ORAL_TABLET | Freq: Every day | ORAL | 1 refills | Status: DC

## 2019-04-28 MED ORDER — BUPROPION HCL ER (XL) 150 MG PO TB24: 450.0000 mg | ORAL_TABLET | Freq: Every day | ORAL | 1 refills | Status: DC

## 2019-04-28 MED ORDER — CITALOPRAM HYDROBROMIDE 40 MG PO TABS: 40.0000 mg | ORAL_TABLET | Freq: Every day | ORAL | 1 refills | Status: DC

## 2019-04-28 NOTE — Patient Instructions (Signed)
Diabetes Mellitus and Foot Care Foot care is an important part of your health, especially when you have diabetes. Diabetes may cause you to have problems because of poor blood flow (circulation) to your feet and legs, which can cause your skin to:  Become thinner and drier.  Break more easily.  Heal more slowly.  Peel and crack. You may also have nerve damage (neuropathy) in your legs and feet, causing decreased feeling in them. This means that you may not notice minor injuries to your feet that could lead to more serious problems. Noticing and addressing any potential problems early is the best way to prevent future foot problems. How to care for your feet Foot hygiene  Wash your feet daily with warm water and mild soap. Do not use hot water. Then, pat your feet and the areas between your toes until they are completely dry. Do not soak your feet as this can dry your skin.  Trim your toenails straight across. Do not dig under them or around the cuticle. File the edges of your nails with an emery board or nail file.  Apply a moisturizing lotion or petroleum jelly to the skin on your feet and to dry, brittle toenails. Use lotion that does not contain alcohol and is unscented. Do not apply lotion between your toes. Shoes and socks  Wear clean socks or stockings every day. Make sure they are not too tight. Do not wear knee-high stockings since they may decrease blood flow to your legs.  Wear shoes that fit properly and have enough cushioning. Always look in your shoes before you put them on to be sure there are no objects inside.  To break in new shoes, wear them for just a few hours a day. This prevents injuries on your feet. Wounds, scrapes, corns, and calluses  Check your feet daily for blisters, cuts, bruises, sores, and redness. If you cannot see the bottom of your feet, use a mirror or ask someone for help.  Do not cut corns or calluses or try to remove them with medicine.  If you  find a minor scrape, cut, or break in the skin on your feet, keep it and the skin around it clean and dry. You may clean these areas with mild soap and water. Do not clean the area with peroxide, alcohol, or iodine.  If you have a wound, scrape, corn, or callus on your foot, look at it several times a day to make sure it is healing and not infected. Check for: ? Redness, swelling, or pain. ? Fluid or blood. ? Warmth. ? Pus or a bad smell. General instructions  Do not cross your legs. This may decrease blood flow to your feet.  Do not use heating pads or hot water bottles on your feet. They may burn your skin. If you have lost feeling in your feet or legs, you may not know this is happening until it is too late.  Protect your feet from hot and cold by wearing shoes, such as at the beach or on hot pavement.  Schedule a complete foot exam at least once a year (annually) or more often if you have foot problems. If you have foot problems, report any cuts, sores, or bruises to your health care provider immediately. Contact a health care provider if:  You have a medical condition that increases your risk of infection and you have any cuts, sores, or bruises on your feet.  You have an injury that is not   healing.  You have redness on your legs or feet.  You feel burning or tingling in your legs or feet.  You have pain or cramps in your legs and feet.  Your legs or feet are numb.  Your feet always feel cold.  You have pain around a toenail. Get help right away if:  You have a wound, scrape, corn, or callus on your foot and: ? You have pain, swelling, or redness that gets worse. ? You have fluid or blood coming from the wound, scrape, corn, or callus. ? Your wound, scrape, corn, or callus feels warm to the touch. ? You have pus or a bad smell coming from the wound, scrape, corn, or callus. ? You have a fever. ? You have a red line going up your leg. Summary  Check your feet every day  for cuts, sores, red spots, swelling, and blisters.  Moisturize feet and legs daily.  Wear shoes that fit properly and have enough cushioning.  If you have foot problems, report any cuts, sores, or bruises to your health care provider immediately.  Schedule a complete foot exam at least once a year (annually) or more often if you have foot problems. This information is not intended to replace advice given to you by your health care provider. Make sure you discuss any questions you have with your health care provider. Document Released: 05/25/2000 Document Revised: 07/10/2017 Document Reviewed: 06/29/2016 Elsevier Patient Education  2020 [employer]  
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
## 2019-04-29 LAB — LDL CHOLESTEROL, DIRECT: LDL Direct: 211 mg/dL — ABNORMAL HIGH (ref 0–99)

## 2019-06-22 LAB — HM DIABETES EYE EXAM

## 2019-07-03 ENCOUNTER — Other Ambulatory Visit: Payer: Self-pay | Admitting: Nurse Practitioner

## 2019-07-03 DIAGNOSIS — E782 Mixed hyperlipidemia: Secondary | ICD-10-CM

## 2019-07-31 ENCOUNTER — Ambulatory Visit: Payer: Self-pay | Admitting: Nurse Practitioner

## 2019-08-04 ENCOUNTER — Other Ambulatory Visit: Payer: Self-pay | Admitting: Nurse Practitioner

## 2019-08-04 DIAGNOSIS — E782 Mixed hyperlipidemia: Secondary | ICD-10-CM

## 2019-08-20 ENCOUNTER — Ambulatory Visit: Payer: Self-pay | Admitting: Nurse Practitioner

## 2019-09-19 ENCOUNTER — Other Ambulatory Visit: Payer: Self-pay | Admitting: Nurse Practitioner

## 2019-09-19 DIAGNOSIS — I10 Essential (primary) hypertension: Secondary | ICD-10-CM

## 2019-10-15 ENCOUNTER — Other Ambulatory Visit: Payer: Self-pay | Admitting: Nurse Practitioner

## 2019-10-15 DIAGNOSIS — G43711 Chronic migraine without aura, intractable, with status migrainosus: Secondary | ICD-10-CM

## 2019-10-28 ENCOUNTER — Other Ambulatory Visit: Payer: Self-pay | Admitting: Nurse Practitioner

## 2019-10-28 DIAGNOSIS — E782 Mixed hyperlipidemia: Secondary | ICD-10-CM

## 2019-11-11 ENCOUNTER — Other Ambulatory Visit: Payer: Self-pay | Admitting: Nurse Practitioner

## 2019-11-11 DIAGNOSIS — E782 Mixed hyperlipidemia: Secondary | ICD-10-CM

## 2019-12-23 ENCOUNTER — Encounter: Payer: Self-pay | Admitting: Nurse Practitioner

## 2019-12-23 ENCOUNTER — Other Ambulatory Visit: Payer: Self-pay

## 2019-12-23 ENCOUNTER — Ambulatory Visit (INDEPENDENT_AMBULATORY_CARE_PROVIDER_SITE_OTHER): Payer: Self-pay | Admitting: Nurse Practitioner

## 2019-12-23 VITALS — BP 121/65 | HR 67 | Temp 98.1°F | Resp 20 | Ht 64.0 in | Wt 227.0 lb

## 2019-12-23 DIAGNOSIS — E782 Mixed hyperlipidemia: Secondary | ICD-10-CM

## 2019-12-23 DIAGNOSIS — G43411 Hemiplegic migraine, intractable, with status migrainosus: Secondary | ICD-10-CM

## 2019-12-23 DIAGNOSIS — F411 Generalized anxiety disorder: Secondary | ICD-10-CM

## 2019-12-23 DIAGNOSIS — G43711 Chronic migraine without aura, intractable, with status migrainosus: Secondary | ICD-10-CM

## 2019-12-23 DIAGNOSIS — E1142 Type 2 diabetes mellitus with diabetic polyneuropathy: Secondary | ICD-10-CM

## 2019-12-23 DIAGNOSIS — K5790 Diverticulosis of intestine, part unspecified, without perforation or abscess without bleeding: Secondary | ICD-10-CM

## 2019-12-23 DIAGNOSIS — F3341 Major depressive disorder, recurrent, in partial remission: Secondary | ICD-10-CM

## 2019-12-23 DIAGNOSIS — J45909 Unspecified asthma, uncomplicated: Secondary | ICD-10-CM

## 2019-12-23 DIAGNOSIS — I10 Essential (primary) hypertension: Secondary | ICD-10-CM

## 2019-12-23 DIAGNOSIS — E039 Hypothyroidism, unspecified: Secondary | ICD-10-CM

## 2019-12-23 LAB — BAYER DCA HB A1C WAIVED: HB A1C (BAYER DCA - WAIVED): 10.6 % — ABNORMAL HIGH (ref <7.0)

## 2019-12-23 MED ORDER — BUPROPION HCL ER (XL) 150 MG PO TB24: 450.0000 mg | ORAL_TABLET | Freq: Every day | ORAL | 1 refills | Status: DC

## 2019-12-23 MED ORDER — PROPRANOLOL HCL 40 MG PO TABS: 40.0000 mg | ORAL_TABLET | Freq: Two times a day (BID) | ORAL | 1 refills | Status: DC

## 2019-12-23 MED ORDER — BENAZEPRIL HCL 20 MG PO TABS: 20.0000 mg | ORAL_TABLET | Freq: Every day | ORAL | 1 refills | Status: DC

## 2019-12-23 MED ORDER — GLIPIZIDE ER 10 MG PO TB24: 10.0000 mg | ORAL_TABLET | Freq: Every day | ORAL | 1 refills | Status: DC

## 2019-12-23 MED ORDER — CITALOPRAM HYDROBROMIDE 40 MG PO TABS: 40.0000 mg | ORAL_TABLET | Freq: Every day | ORAL | 1 refills | Status: DC

## 2019-12-23 NOTE — Progress Notes (Addendum)
Subjective:    Patient ID: [name], female    DOB: 22-Nov-1960, 59 y.o.   MRN: [id number]   Chief Complaint: Medical Management of Chronic Issues    HPI:  1. Essential hypertension Does not check BP at home, does not add salt to anything.  BP Readings from Last 3 Encounters:  12/23/19 121/65  04/28/19 114/63  04/25/19 124/85     2. Mixed hyperlipidemia Does watch fat intake. Eats a lot of fish and chicken. Does not exercise.  Lab Results  Component Value Date   CHOL 388 (H) 01/15/2019   HDL 39 (L) 01/15/2019   LDLCALC Comment 01/15/2019   LDLDIRECT 211 (H) 04/28/2019   TRIG 474 (H) 01/15/2019   CHOLHDL 9.9 (H) 01/15/2019     3. Type 2 diabetes mellitus with diabetic polyneuropathy, without long-term current use of insulin (HCC) Does not check CBG at all. Does watch carb intake. She has been out of janumet for over 2 months because she had no insurance Lab Results  Component Value Date   HGBA1C 9.5 (H) 04/28/2019     4. Intractable hemiplegic migraine with status migrainosus Does have migraines often. Every couple of days. Does not usually see an aura.   5. Mild asthma without complication, unspecified whether persistent Does not have trouble breathing, avoids triggers like smoke.   6. Diverticulosis Does avoid seeded foods and nuts.   7. Acquired hypothyroidism Does not take thyroid medication.  Lab Results  Component Value Date   TSH 3.530 01/15/2019   T4TOTAL 6.1 01/15/2019     8. Anxiety state Reports having anxiety related to stress in her life. Husband is disabled and she is the bread winner.  GAD 7 : Generalized Anxiety Score 04/28/2019  Nervous, Anxious, on Edge 1  Control/stop worrying 1  Worry too much - different things 1  Trouble relaxing 1  Restless 0  Easily annoyed or irritable 0  Afraid - awful might happen 1  Total GAD 7 Score 5     9. Recurrent major depressive disorder, in partial remission ([place]) Reports not  having depression as of lately, but did when she lost her mother a year ago. Report staying busy to avoid depression and takes medicine as she is directed.  Depression screen [facility] 2/9 12/23/2019 04/28/2019 01/15/2019  Decreased Interest 0 0 0  Down, Depressed, Hopeless 0 0 0  PHQ - 2 Score 0 0 0  Altered sleeping - - -  Tired, decreased energy - - -  Change in appetite - - -  Feeling bad or failure about yourself  - - -  Trouble concentrating - - -  Moving slowly or fidgety/restless - - -  Suicidal thoughts - - -  PHQ-9 Score - - -     10. Morbid obesity ([place]) Does weigh self at home. Limits fat intake and carb intake. Does not exercise.  Wt Readings from Last 3 Encounters:  12/23/19 227 lb (103 kg)  04/28/19 231 lb (104.8 kg)  04/25/19 225 lb (102.1 kg)       Outpatient Encounter Medications as of 12/23/2019  Medication Sig  . albuterol (PROVENTIL HFA;VENTOLIN HFA) 108 (90 Base) MCG/ACT inhaler Inhale 2 puffs into the lungs every 4 (four) hours as needed for wheezing or shortness of breath. (NEEDS TO BE SEEN)  . atorvastatin (LIPITOR) 40 MG tablet Take 1 tablet (40 mg total) by mouth daily. Needs to be seen for further refills.  . benazepril (LOTENSIN) 20 MG tablet TAKE  1 TABLET BY MOUTH DAILY  . buPROPion (WELLBUTRIN XL) 150 MG 24 hr tablet Take 3 tablets (450 mg total) by mouth daily.  . citalopram (CELEXA) 40 MG tablet Take 1 tablet (40 mg total) by mouth daily.  Marland Kitchen EPINEPHrine 0.3 mg/0.3 mL IJ SOAJ injection INJECT 0.3 ML (0.3 MG TOTAL) INTO THE SHOULDER, THIGH, OR BUTTOCKS ONCE FOR 1 DOSE.  . fenofibrate 160 MG tablet Take 1 tablet (160 mg total) by mouth daily. (Needs to be seen before next refill)  . glipiZIDE (GLUCOTROL XL) 10 MG 24 hr tablet Take 1 tablet (10 mg total) by mouth daily.  . meclizine (ANTIVERT) 25 MG tablet Take 1 tablet (25 mg total) by mouth 2 (two) times daily as needed for dizziness.  . propranolol (INDERAL) 40 MG tablet Take 1 tablet (40 mg total) by mouth  2 (two) times daily. (Needs to be seen before next refill)  . sitaGLIPtin-metformin (JANUMET) 50-1000 MG tablet Take 1 tablet by mouth 2 (two) times daily with breakfast and lunch.  . SUMAtriptan (IMITREX) 50 MG tablet Take 1 tablet (50 mg total) by mouth every 2 (two) hours as needed for migraine. May repeat in 2 hours if headache persists or recurs.  . topiramate (TOPAMAX) 50 MG tablet Take 1 tablet (50 mg total) by mouth 2 (two) times daily.   No facility-administered encounter medications on file as of 12/23/2019.    Past Surgical History:  Procedure Laterality Date  . ABDOMINAL HYSTERECTOMY  02/2007   + lysis of adhesions  . APPENDECTOMY  1990s   ruptured, late 90s  . BILATERAL OOPHORECTOMY      in 2 surgeries prior to 9/08  . COLONOSCOPY  02/2010    normal upper endoscopy, single colonic polyp,  . VENTRAL HERNIA REPAIR     x3    History reviewed. No pertinent family history.  New complaints: No new complaints. Wants to discuss migraine therapy.   Social history: Lives at home with her husband and is the breadwinner as her husband is disabled.   Controlled substance contract: n/a     Review of Systems  Constitutional: Negative.   HENT: Negative.   Eyes: Negative.   Respiratory: Negative.   Cardiovascular: Negative.   Gastrointestinal: Negative.   Endocrine: Negative.   Genitourinary: Negative.   Musculoskeletal: Negative.   Skin: Negative.   Allergic/Immunologic: Negative.   Neurological: Negative.   Hematological: Negative.   Psychiatric/Behavioral: Negative.        Objective:   Physical Exam Vitals reviewed.  Constitutional:      Appearance: Normal appearance. She is obese.  HENT:     Head: Normocephalic and atraumatic.     Right Ear: Tympanic membrane, ear canal and external ear normal. There is impacted cerumen.     Left Ear: Tympanic membrane, ear canal and external ear normal.     Nose: Nose normal.     Mouth/Throat:     Mouth: Mucous membranes  are moist.     Pharynx: Oropharynx is clear.  Eyes:     Extraocular Movements: Extraocular movements intact.     Conjunctiva/sclera: Conjunctivae normal.     Pupils: Pupils are equal, round, and reactive to light.  Cardiovascular:     Rate and Rhythm: Normal rate and regular rhythm.     Pulses: Normal pulses.     Heart sounds: Normal heart sounds.  Pulmonary:     Effort: Pulmonary effort is normal.     Breath sounds: Normal breath sounds.  Abdominal:  General: Abdomen is flat. Bowel sounds are normal.     Palpations: Abdomen is soft.  Genitourinary:    General: Normal vulva.     Rectum: Normal.  Musculoskeletal:        General: Normal range of motion.     Cervical back: Normal range of motion and neck supple.  Skin:    General: Skin is warm and dry.     Capillary Refill: Capillary refill takes less than 2 seconds.  Neurological:     General: No focal deficit present.     Mental Status: She is alert and oriented to person, place, and time. Mental status is at baseline.  Psychiatric:        Mood and Affect: Mood normal.        Behavior: Behavior normal.        Thought Content: Thought content normal.        Judgment: Judgment normal.    BP 121/65   Pulse 67   Temp 98.1 F (36.7 C) (Temporal)   Resp 20   Ht 5' 4"  (1.626 m)   Wt 227 lb (103 kg)   SpO2 98%   BMI 38.96 kg/m      Assessment & Plan:  BEBE MONCURE comes in today with chief complaint of Medical Management of Chronic Issues   Diagnosis and orders addressed:  1. Essential hypertension Patient encouraged to reduce sodium intake and check BP regularly at home.  - CBC with Differential/Platelet - CMP14+EGFR  2. Mixed hyperlipidemia Patient encouraged to reduce fat intake and exercise as tolerated. Also reduce carb intake.  - Lipid panel  3. Type 2 diabetes mellitus with diabetic polyneuropathy, without long-term current use of insulin ([place]) Patient encouraged to reduce carb-intake and check  fasting CBGs daily.  Keep diary of carbs Back on janumet and glipizide - Bayer DCA Hb A1c Waived - Microalbumin / creatinine urine ratio  4. Intractable hemiplegic migraine with status migrainosus Patient encouraged to avoid triggers and take medication as directed.   5. Mild asthma without complication, unspecified whether persistent Patient encouraged to avoid triggers and maintain therapeutic regimen.  6. Diverticulosis Patient encouraged to avoid seeded foods and nuts.   7. Acquired hypothyroidism Patient encouraged to continue taking medication at the same time daily.   8. Anxiety state Patient encouraged to channel anxiety, relieve stress, and consider counseling.   9. Recurrent major depressive disorder, in partial remission [facility]) Patient encouraged to journal depressive episodes and report thoughts of self-harm.   10. Morbid obesity ([place]) Patient encouraged to reduce fat intake and exercise as tolerated. Encouraged to weigh self regularly.   Meds ordered this encounter  Medications  . buPROPion (WELLBUTRIN XL) 150 MG 24 hr tablet    Sig: Take 3 tablets (450 mg total) by mouth daily.    Dispense:  270 tablet    Refill:  1    Order Specific Question:   Supervising Provider    Answer:   Caryl Pina A [id number]  . benazepril (LOTENSIN) 20 MG tablet    Sig: Take 1 tablet (20 mg total) by mouth daily.    Dispense:  90 tablet    Refill:  1    Order Specific Question:   Supervising Provider    Answer:   Caryl Pina A [id number]  . propranolol (INDERAL) 40 MG tablet    Sig: Take 1 tablet (40 mg total) by mouth 2 (two) times daily. (Needs to be seen before next refill)    Dispense:  180 tablet    Refill:  1    Order Specific Question:   Supervising Provider    Answer:   Caryl Pina A [id number]  . citalopram (CELEXA) 40 MG tablet    Sig: Take 1 tablet (40 mg total) by mouth daily.    Dispense:  90 tablet    Refill:  1    Order Specific Question:    Supervising Provider    Answer:   Caryl Pina A [id number]  . glipiZIDE (GLUCOTROL XL) 10 MG 24 hr tablet    Sig: Take 1 tablet (10 mg total) by mouth daily.    Dispense:  90 tablet    Refill:  1    Order Specific Question:   Supervising Provider    Answer:   Caryl Pina A [[id number]]  only filled meds she can afford- has no insurance and cannot afford lipitor, fenofibrate , tpopamax and imitrex- will talk with clinical pharmacist.  Labs pending Health Maintenance reviewed Diet and exercise encouraged  Follow up plan: Follow-up in 6 months    Mary-Margaret Hassell Done, [place], [place] Student

## 2019-12-23 NOTE — Patient Instructions (Signed)
Diabetes Mellitus and Foot Care Foot care is an important part of your health, especially when you have diabetes. Diabetes may cause you to have problems because of poor blood flow (circulation) to your feet and legs, which can cause your skin to:  Become thinner and drier.  Break more easily.  Heal more slowly.  Peel and crack. You may also have nerve damage (neuropathy) in your legs and feet, causing decreased feeling in them. This means that you may not notice minor injuries to your feet that could lead to more serious problems. Noticing and addressing any potential problems early is the best way to prevent future foot problems. How to care for your feet Foot hygiene  Wash your feet daily with warm water and mild soap. Do not use hot water. Then, pat your feet and the areas between your toes until they are completely dry. Do not soak your feet as this can dry your skin.  Trim your toenails straight across. Do not dig under them or around the cuticle. File the edges of your nails with an emery board or nail file.  Apply a moisturizing lotion or petroleum jelly to the skin on your feet and to dry, brittle toenails. Use lotion that does not contain alcohol and is unscented. Do not apply lotion between your toes. Shoes and socks  Wear clean socks or stockings every day. Make sure they are not too tight. Do not wear knee-high stockings since they may decrease blood flow to your legs.  Wear shoes that fit properly and have enough cushioning. Always look in your shoes before you put them on to be sure there are no objects inside.  To break in new shoes, wear them for just a few hours a day. This prevents injuries on your feet. Wounds, scrapes, corns, and calluses  Check your feet daily for blisters, cuts, bruises, sores, and redness. If you cannot see the bottom of your feet, use a mirror or ask someone for help.  Do not cut corns or calluses or try to remove them with medicine.  If you  find a minor scrape, cut, or break in the skin on your feet, keep it and the skin around it clean and dry. You may clean these areas with mild soap and water. Do not clean the area with peroxide, alcohol, or iodine.  If you have a wound, scrape, corn, or callus on your foot, look at it several times a day to make sure it is healing and not infected. Check for: ? Redness, swelling, or pain. ? Fluid or blood. ? Warmth. ? Pus or a bad smell. General instructions  Do not cross your legs. This may decrease blood flow to your feet.  Do not use heating pads or hot water bottles on your feet. They may burn your skin. If you have lost feeling in your feet or legs, you may not know this is happening until it is too late.  Protect your feet from hot and cold by wearing shoes, such as at the beach or on hot pavement.  Schedule a complete foot exam at least once a year (annually) or more often if you have foot problems. If you have foot problems, report any cuts, sores, or bruises to your health care provider immediately. Contact a health care provider if:  You have a medical condition that increases your risk of infection and you have any cuts, sores, or bruises on your feet.  You have an injury that is not   healing.  You have redness on your legs or feet.  You feel burning or tingling in your legs or feet.  You have pain or cramps in your legs and feet.  Your legs or feet are numb.  Your feet always feel cold.  You have pain around a toenail. Get help right away if:  You have a wound, scrape, corn, or callus on your foot and: ? You have pain, swelling, or redness that gets worse. ? You have fluid or blood coming from the wound, scrape, corn, or callus. ? Your wound, scrape, corn, or callus feels warm to the touch. ? You have pus or a bad smell coming from the wound, scrape, corn, or callus. ? You have a fever. ? You have a red line going up your leg. Summary  Check your feet every day  for cuts, sores, red spots, swelling, and blisters.  Moisturize feet and legs daily.  Wear shoes that fit properly and have enough cushioning.  If you have foot problems, report any cuts, sores, or bruises to your health care provider immediately.  Schedule a complete foot exam at least once a year (annually) or more often if you have foot problems. This information is not intended to replace advice given to you by your health care provider. Make sure you discuss any questions you have with your health care provider. Document Revised: 02/18/2019 Document Reviewed: 06/29/2016 Elsevier Patient Education  2020 [employer]  

## 2019-12-24 LAB — LIPID PANEL
Chol/HDL Ratio: 7.6 ratio — ABNORMAL HIGH (ref 0.0–4.4)
Cholesterol, Total: 288 mg/dL — ABNORMAL HIGH (ref 100–199)
HDL: 38 mg/dL — ABNORMAL LOW (ref >39)
LDL Chol Calc (NIH): 172 mg/dL — ABNORMAL HIGH (ref 0–99)
Triglycerides: 401 mg/dL — ABNORMAL HIGH (ref 0–149)
VLDL Cholesterol Cal: 78 mg/dL — ABNORMAL HIGH (ref 5–40)

## 2019-12-24 LAB — CBC WITH DIFFERENTIAL/PLATELET
Basophils Absolute: 0.1 10*3/uL (ref 0.0–0.2)
Basos: 1 %
EOS (ABSOLUTE): 0.2 10*3/uL (ref 0.0–0.4)
Eos: 3 %
Hematocrit: 34.8 % (ref 34.0–46.6)
Hemoglobin: 11.4 g/dL (ref 11.1–15.9)
Immature Grans (Abs): 0.1 10*3/uL (ref 0.0–0.1)
Immature Granulocytes: 1 %
Lymphocytes Absolute: 2.3 10*3/uL (ref 0.7–3.1)
Lymphs: 34 %
MCH: 28.6 pg (ref 26.6–33.0)
MCHC: 32.8 g/dL (ref 31.5–35.7)
MCV: 87 fL (ref 79–97)
Monocytes Absolute: 0.4 10*3/uL (ref 0.1–0.9)
Monocytes: 7 %
Neutrophils Absolute: 3.7 10*3/uL (ref 1.4–7.0)
Neutrophils: 54 %
Platelets: 173 10*3/uL (ref 150–450)
RBC: 3.98 x10E6/uL (ref 3.77–5.28)
RDW: 13.4 % (ref 11.7–15.4)
WBC: 6.7 10*3/uL (ref 3.4–10.8)

## 2019-12-24 LAB — CMP14+EGFR
ALT: 55 IU/L — ABNORMAL HIGH (ref 0–32)
AST: 31 IU/L (ref 0–40)
Albumin/Globulin Ratio: 2 (ref 1.2–2.2)
Albumin: 4.4 g/dL (ref 3.8–4.9)
Alkaline Phosphatase: 83 IU/L (ref 48–121)
BUN/Creatinine Ratio: 16 (ref 9–23)
BUN: 16 mg/dL (ref 6–24)
Bilirubin Total: <0.2 mg/dL (ref 0.0–1.2)
CO2: 17 mmol/L — ABNORMAL LOW (ref 20–29)
Calcium: 9.4 mg/dL (ref 8.7–10.2)
Chloride: 104 mmol/L (ref 96–106)
Creatinine, Ser: 0.99 mg/dL (ref 0.57–1.00)
GFR calc Af Amer: 72 mL/min/{1.73_m2} (ref >59)
GFR calc non Af Amer: 63 mL/min/{1.73_m2} (ref >59)
Globulin, Total: 2.2 g/dL (ref 1.5–4.5)
Glucose: 305 mg/dL — ABNORMAL HIGH (ref 65–99)
Potassium: 4.3 mmol/L (ref 3.5–5.2)
Sodium: 137 mmol/L (ref 134–144)
Total Protein: 6.6 g/dL (ref 6.0–8.5)

## 2019-12-27 ENCOUNTER — Other Ambulatory Visit: Payer: Self-pay | Admitting: Nurse Practitioner

## 2019-12-27 DIAGNOSIS — E782 Mixed hyperlipidemia: Secondary | ICD-10-CM

## 2019-12-28 ENCOUNTER — Other Ambulatory Visit: Payer: Self-pay | Admitting: Nurse Practitioner

## 2019-12-28 DIAGNOSIS — I10 Essential (primary) hypertension: Secondary | ICD-10-CM

## 2019-12-28 DIAGNOSIS — G43711 Chronic migraine without aura, intractable, with status migrainosus: Secondary | ICD-10-CM

## 2019-12-28 DIAGNOSIS — F411 Generalized anxiety disorder: Secondary | ICD-10-CM

## 2019-12-28 DIAGNOSIS — F3341 Major depressive disorder, recurrent, in partial remission: Secondary | ICD-10-CM

## 2019-12-28 DIAGNOSIS — E1142 Type 2 diabetes mellitus with diabetic polyneuropathy: Secondary | ICD-10-CM

## 2019-12-28 DIAGNOSIS — E782 Mixed hyperlipidemia: Secondary | ICD-10-CM

## 2020-01-24 ENCOUNTER — Other Ambulatory Visit: Payer: Self-pay | Admitting: Nurse Practitioner

## 2020-01-24 DIAGNOSIS — G43711 Chronic migraine without aura, intractable, with status migrainosus: Secondary | ICD-10-CM

## 2020-02-02 ENCOUNTER — Other Ambulatory Visit: Payer: Self-pay

## 2020-02-05 ENCOUNTER — Telehealth: Payer: Self-pay | Admitting: Nurse Practitioner

## 2020-02-05 MED ORDER — TRAZODONE HCL 50 MG PO TABS: 25.0000 mg | ORAL_TABLET | Freq: Every evening | ORAL | 3 refills | Status: DC | PRN

## 2020-02-05 NOTE — Telephone Encounter (Signed)
trazadone rx sent to pharmacy

## 2020-02-05 NOTE — Telephone Encounter (Signed)
Husband was [name] patient passed away on 14-Feb-2023

## 2020-02-09 ENCOUNTER — Telehealth: Payer: Self-pay | Admitting: Nurse Practitioner

## 2020-02-09 NOTE — Telephone Encounter (Signed)
E-Visit for Corona Virus Screening  We are sorry you are not feeling well. We are here to help!  You have tested positive for COVID-19, meaning that you were infected with the novel coronavirus and could give the germ to others.    You have been enrolled in [facility] for COVID-19. Daily you will receive a questionnaire within the [facility] website. Our COVID-19 response team will be monitoring your responses daily.  Please continue isolation at home, for at least 10 days since the start of your symptoms and until you have had 24 hours with no fever (without taking a fever reducer) and with improving of symptoms.  Please continue good preventive care measures, including:  frequent hand-washing, avoid touching your face, cover coughs/sneezes, stay out of crowds and keep a 6 foot distance from others.  Follow up with your provider or go to the nearest hospital ED for re-assessment if fever/cough/breathlessness return.  The following symptoms may appear 2-14 days after exposure: Fever Cough Shortness of breath or difficulty breathing Chills Repeated shaking with chills Muscle pain Headache Sore throat New loss of taste or smell Fatigue Congestion or runny nose Nausea or vomiting Diarrhea  Go to the nearest hospital ED for assessment if fever/cough/breathlessness are severe or illness seems like a threat to life.  It is vitally important that if you feel that you have an infection such as this virus or any other virus that you stay home and away from places where you may spread it to others.  You should avoid contact with people age 9 and older.   You may also take acetaminophen (Tylenol) as needed for fever.  Reduce your risk of any infection by using the same precautions used for avoiding the common cold or flu:  Wash your hands often with soap and warm water for at least 20 seconds.  If soap and water are not readily available, use an alcohol-based hand sanitizer with at  least 60% alcohol.  If coughing or sneezing, cover your mouth and nose by coughing or sneezing into the elbow areas of your shirt or coat, into a tissue or into your sleeve (not your hands). Avoid shaking hands with others and consider head nods or verbal greetings only. Avoid touching your eyes, nose, or mouth with unwashed hands.  Avoid close contact with people who are sick. Avoid places or events with large numbers of people in one location, like concerts or sporting events. Carefully consider travel plans you have or are making. If you are planning any travel outside or inside the [place], visit the CDC's Travelers' Health webpage for the latest health notices. If you have some symptoms but not all symptoms, continue to monitor at home and seek medical attention if your symptoms worsen. If you are having a medical emergency, call 911.  HOME CARE Only take medications as instructed by your medical team. Drink plenty of fluids and get plenty of rest. A steam or ultrasonic humidifier can help if you have congestion.   GET HELP RIGHT AWAY IF YOU HAVE EMERGENCY WARNING SIGNS** FOR COVID-19. If you or someone is showing any of these signs seek emergency medical care immediately. Call 911 or proceed to your closest emergency facility if: You develop worsening high fever. Trouble breathing Bluish lips or face Persistent pain or pressure in the chest New confusion Inability to wake or stay awake You cough up blood. Your symptoms become more severe  **This list is not all possible symptoms. Contact your medical provider  for any symptoms that are sever or concerning to you.  MAKE SURE YOU  Understand these instructions. Will watch your condition. Will get help right away if you are not doing well or get worse.  Your e-visit answers were reviewed by a board certified advanced clinical practitioner to complete your personal care plan.  Depending on the condition, your plan could have included  both over the counter or prescription medications.  If there is a problem please reply once you have received a response from your provider.  Your safety is important to [place].  If you have drug allergies check your prescription carefully.    You can use MyChart to ask questions about today's visit, request a non-urgent call back, or ask for a work or school excuse for 24 hours related to this e-Visit. If it has been greater than 24 hours you will need to follow up with your provider, or enter a new e-Visit to address those concerns. You will get an e-mail in the next two days asking about your experience.  I hope that your e-visit has been valuable and will speed your recovery. Thank you for using e-visits.

## 2020-02-09 NOTE — Telephone Encounter (Signed)
[name]'s husband passed on Wed with Covid. She tested positive last Saturday. Informed pt that she should quarantine for 5-6 more days or until she is symptom free.  Pt is having to postpone all funeral arrangements at this time. She denies ever having any covid symptoms.

## 2020-02-09 NOTE — Telephone Encounter (Signed)
Pt called stating that she tested positive for COVID and wants advise on what she needs to do/not do while she has COVID.

## 2020-03-30 ENCOUNTER — Encounter: Payer: Self-pay | Admitting: Family Medicine

## 2020-03-30 ENCOUNTER — Ambulatory Visit (INDEPENDENT_AMBULATORY_CARE_PROVIDER_SITE_OTHER): Payer: Self-pay | Admitting: Family Medicine

## 2020-03-30 DIAGNOSIS — J4521 Mild intermittent asthma with (acute) exacerbation: Secondary | ICD-10-CM

## 2020-03-30 MED ORDER — PREDNISONE 20 MG PO TABS: ORAL_TABLET | ORAL | 0 refills | Status: DC

## 2020-03-30 MED ORDER — AZITHROMYCIN 250 MG PO TABS: ORAL_TABLET | ORAL | 0 refills | Status: DC

## 2020-03-30 MED ORDER — ALBUTEROL SULFATE HFA 108 (90 BASE) MCG/ACT IN AERS: 2.0000 | INHALATION_SPRAY | RESPIRATORY_TRACT | 1 refills | Status: AC | PRN

## 2020-03-30 NOTE — Progress Notes (Signed)
Virtual Visit via telephone Note  I connected with Marylene Land on 03/30/20 at 1744 by telephone and verified that I am speaking with the correct person using two identifiers. MARAJADE LEI is currently located at home and patient are currently with her during visit. The provider, Fransisca Kaufmann Rayni Nemitz, MD is located in their office at time of visit.  Call ended at 1753  I discussed the limitations, risks, security and privacy concerns of performing an evaluation and management service by telephone and the availability of in person appointments. I also discussed with the patient that there may be a patient responsible charge related to this service. The patient expressed understanding and agreed to proceed.   History and Present Illness: Patient is calling in for cough and congestion for the past 4 days.  She is having major coughing fits keeping her up at night.  She is having headaches from it.  She had Covid in 02-12-23 and her husband died from covid. She is having some wheezing. She is using her albuterol. She is having trouble breathing as well.   No diagnosis found.  Outpatient Encounter Medications as of 03/30/2020  Medication Sig  . albuterol (PROVENTIL HFA;VENTOLIN HFA) 108 (90 Base) MCG/ACT inhaler Inhale 2 puffs into the lungs every 4 (four) hours as needed for wheezing or shortness of breath. (NEEDS TO BE SEEN)  . atorvastatin (LIPITOR) 40 MG tablet Take 1 tablet (40 mg total) by mouth daily.  . benazepril (LOTENSIN) 20 MG tablet Take 1 tablet (20 mg total) by mouth daily.  Marland Kitchen buPROPion (WELLBUTRIN XL) 150 MG 24 hr tablet Take 3 tablets (450 mg total) by mouth daily.  . citalopram (CELEXA) 40 MG tablet Take 1 tablet (40 mg total) by mouth daily.  Marland Kitchen EPINEPHrine 0.3 mg/0.3 mL IJ SOAJ injection INJECT 0.3 ML (0.3 MG TOTAL) INTO THE SHOULDER, THIGH, OR BUTTOCKS ONCE FOR 1 DOSE.  . fenofibrate 160 MG tablet TAKE ONE TABLET BY MOUTH DAILY  . glipiZIDE (GLUCOTROL XL) 10 MG  24 hr tablet Take 1 tablet (10 mg total) by mouth daily.  . meclizine (ANTIVERT) 25 MG tablet Take 1 tablet (25 mg total) by mouth 2 (two) times daily as needed for dizziness. (Patient not taking: Reported on 12/23/2019)  . propranolol (INDERAL) 40 MG tablet Take 1 tablet (40 mg total) by mouth 2 (two) times daily. (Needs to be seen before next refill)  . sitaGLIPtin-metformin (JANUMET) 50-1000 MG tablet Take 1 tablet by mouth 2 (two) times daily with breakfast and lunch.  . SUMAtriptan (IMITREX) 50 MG tablet Take 1 tablet (50 mg total) by mouth every 2 (two) hours as needed for migraine. May repeat in 2 hours if headache persists or recurs. (Patient not taking: Reported on 12/23/2019)  . topiramate (TOPAMAX) 50 MG tablet TAKE 1 TABLET BY MOUTH TWO TIMES A DAY  . traZODone (DESYREL) 50 MG tablet Take 0.5-1 tablets (25-50 mg total) by mouth at bedtime as needed for sleep.   No facility-administered encounter medications on file as of 03/30/2020.    Review of Systems  Constitutional: Negative for chills and fever.  HENT: Positive for congestion, ear pain, postnasal drip, rhinorrhea, sinus pressure and sneezing. Negative for ear discharge and sore throat.   Eyes: Negative for visual disturbance.  Respiratory: Positive for cough, shortness of breath and wheezing. Negative for chest tightness.   Cardiovascular: Negative for chest pain and leg swelling.  Musculoskeletal: Negative for back pain and gait problem.  Skin: Negative for rash.  Neurological: Negative for light-headedness and headaches.  Psychiatric/Behavioral: Negative for agitation and behavioral problems.  All other systems reviewed and are negative.   Observations/Objective: Patient sounds wheezy and tight and trouble breathing.  Assessment and Plan: Problem List Items Addressed This Visit    None    Visit Diagnoses    Mild intermittent asthma with exacerbation    -  Primary   Relevant Medications   predniSONE (DELTASONE) 20 MG  tablet   azithromycin (ZITHROMAX) 250 MG tablet   albuterol (VENTOLIN HFA) 108 (90 Base) MCG/ACT inhaler   Other Relevant Orders   Novel Coronavirus, NAA ([facility])      Patient will come for covid testing and go to hospital if worsens  Sounds like her asthma is acting up a lot, recommended for her to go to the hospital if anything worsens at all Follow up plan: Return if symptoms worsen or fail to improve.     I discussed the assessment and treatment plan with the patient. The patient was provided an opportunity to ask questions and all were answered. The patient agreed with the plan and demonstrated an understanding of the instructions.   The patient was advised to call back or seek an in-person evaluation if the symptoms worsen or if the condition fails to improve as anticipated.  The above assessment and management plan was discussed with the patient. The patient verbalized understanding of and has agreed to the management plan. Patient is aware to call the clinic if symptoms persist or worsen. Patient is aware when to return to the clinic for a follow-up visit. Patient educated on when it is appropriate to go to the emergency department.    I provided 9 minutes of non-face-to-face time during this encounter.    Worthy Rancher, MD

## 2020-03-31 NOTE — Addendum Note (Signed)
Addended by: Liliane Bade on: 03/31/2020 09:09 AM   Modules accepted: Orders

## 2020-04-01 ENCOUNTER — Ambulatory Visit: Payer: Self-pay | Admitting: Nurse Practitioner

## 2020-04-01 LAB — SARS-COV-2, NAA 2 DAY TAT

## 2020-04-01 LAB — NOVEL CORONAVIRUS, NAA: SARS-CoV-2, NAA: NOT DETECTED

## 2020-04-04 ENCOUNTER — Telehealth: Payer: Self-pay

## 2020-04-04 NOTE — Telephone Encounter (Signed)
Patient done phone call with you done 10/22

## 2020-04-04 NOTE — Telephone Encounter (Signed)
Patient was seen on 03/30/2020 and finished medications yesterday, 5 days.  She still continues to have wheezing, cough, shortness of breath and congestion.  I advised patient she would need to be seen and evaluated but that because of her symptoms we would need to see her in our Covid clinic and we do not have an appointment available until Wednesday.  I advised patient that she either go on to the urgent care or to the ER but she states she does not have insurance and cannot afford to go to those places.  She requested appointment be made here on Wednesday, so I did so.  I advised patient if she had any worsening shortness of breath, wheezing or coughing she should either call 911 or go on to the nearest ER, patient voices understanding.

## 2020-04-04 NOTE — Telephone Encounter (Signed)
I gave her 5 days of medicines 3 days ago so I do not know how she could be out of her medicines already, if she is not feeling better after finishing the full course of it then I think she needs to schedule another appointment because I treated her with 5 days of prednisone and 5 days of azithromycin and it is only been 3 days

## 2020-04-06 ENCOUNTER — Encounter: Payer: Self-pay | Admitting: Family Medicine

## 2020-04-06 ENCOUNTER — Encounter: Payer: Self-pay | Admitting: Nurse Practitioner

## 2020-04-06 ENCOUNTER — Ambulatory Visit (INDEPENDENT_AMBULATORY_CARE_PROVIDER_SITE_OTHER): Payer: Self-pay

## 2020-04-06 ENCOUNTER — Ambulatory Visit (INDEPENDENT_AMBULATORY_CARE_PROVIDER_SITE_OTHER): Payer: Self-pay | Admitting: Family Medicine

## 2020-04-06 VITALS — BP 118/66 | HR 70 | Temp 97.4°F

## 2020-04-06 DIAGNOSIS — R059 Cough, unspecified: Secondary | ICD-10-CM

## 2020-04-06 DIAGNOSIS — J4541 Moderate persistent asthma with (acute) exacerbation: Secondary | ICD-10-CM

## 2020-04-06 DIAGNOSIS — R0989 Other specified symptoms and signs involving the circulatory and respiratory systems: Secondary | ICD-10-CM

## 2020-04-06 DIAGNOSIS — J4521 Mild intermittent asthma with (acute) exacerbation: Secondary | ICD-10-CM

## 2020-04-06 MED ORDER — BENZONATATE 100 MG PO CAPS: 100.0000 mg | ORAL_CAPSULE | Freq: Three times a day (TID) | ORAL | 0 refills | Status: DC | PRN

## 2020-04-06 MED ORDER — PROMETHAZINE-DM 6.25-15 MG/5ML PO SYRP: 2.5000 mL | ORAL_SOLUTION | Freq: Four times a day (QID) | ORAL | 0 refills | Status: DC | PRN

## 2020-04-06 MED ORDER — PREDNISONE 20 MG PO TABS: ORAL_TABLET | ORAL | 0 refills | Status: DC

## 2020-04-06 MED ORDER — METHYLPREDNISOLONE ACETATE 40 MG/ML IJ SUSP
40.0000 mg | Freq: Once | INTRAMUSCULAR | Status: AC
  Administered 2020-04-06: 40 mg via INTRAMUSCULAR

## 2020-04-06 NOTE — Patient Instructions (Signed)
It appears that you have a viral upper respiratory infection (cold).  Cold symptoms can last up to 2 weeks.   ? ?- Get plenty of rest and drink plenty of fluids. ?- Try to breathe moist air. Use a cold mist humidifier. ?- Consume warm fluids (soup or tea) to provide relief for a stuffy nose and to loosen phlegm. ?- For nasal stuffiness, try saline nasal spray or a Neti Pot. Afrin nasal spray can also be used but this product should not be used longer than 3 days or it will cause rebound nasal stuffiness (worsening nasal congestion). ?- For sore throat pain relief: use chloraseptic spray, suck on throat lozenges, hard candy or popsicles; gargle with warm salt water (1/4 tsp. salt per 8 oz. of water); and eat soft, bland foods. ?- Eat a well-balanced diet. If you cannot, ensure you are getting enough nutrients by taking a daily multivitamin. ?- Avoid dairy products, as they can thicken phlegm. ?- Avoid alcohol, as it impairs your body?s immune system. ? ?CONTACT YOUR DOCTOR IF YOU EXPERIENCE ANY OF THE FOLLOWING: ?- High fever ?- Ear pain ?- Sinus-type headache ?- Unusually severe cold symptoms ?- Cough that gets worse while other cold symptoms improve ?- Flare up of any chronic lung problem, such as asthma ?- Your symptoms persist longer than 2 weeks ? ? ?

## 2020-04-06 NOTE — Progress Notes (Signed)
Subjective: CC: Cough, shortness of breath PCP: Chevis Pretty, FNP SAY:[id number] K Para is a 59 y.o. female presenting to clinic today for:  1.  Cough, shortness of breath Patient was seen on 03/30/2020 for same.  She was treated with antibiotics, prednisone burst.  She notes that symptoms did seem to temporarily improve during that time but returned.  She has reports of wheezing, cough, dyspnea.  No hemoptysis.  No fevers or chills.  Symptoms do seem to respond to the albuterol but are short-lived.  Covid testing has been negative.   ROS: Per HPI  Allergies  Allergen Reactions   Aspirin Anaphylaxis   Bee Venom Anaphylaxis   Benadryl [Diphenhydramine Hcl] Anaphylaxis   Morphine And Related Shortness Of Breath and Itching   Vicks Formula 44 Cough-Cold Pm [Dm-Apap-Cpm] Shortness Of Breath and Rash    Not anaphylaxis   Past Medical History:  Diagnosis Date   Abdominal discomfort    Chronic abdominal and pelvic pain resulting in significant loss of time from work   Anxiety    with depression   Asthma    Chest pain    Diabetes mellitus    Drug overdose 2009   60 Naprosyn, psychiatric admission   Hyperlipidemia    Severe   Hypertension    Hypothyroidism    Migraines     Current Outpatient Medications:    albuterol (VENTOLIN HFA) 108 (90 Base) MCG/ACT inhaler, Inhale 2 puffs into the lungs every 4 (four) hours as needed for wheezing or shortness of breath. (NEEDS TO BE SEEN), Disp: 18 g, Rfl: 1   atorvastatin (LIPITOR) 40 MG tablet, Take 1 tablet (40 mg total) by mouth daily., Disp: 90 tablet, Rfl: 1   azithromycin (ZITHROMAX) 250 MG tablet, Take 2 the first day and then one each day after., Disp: 6 tablet, Rfl: 0   benazepril (LOTENSIN) 20 MG tablet, Take 1 tablet (20 mg total) by mouth daily., Disp: 90 tablet, Rfl: 1   buPROPion (WELLBUTRIN XL) 150 MG 24 hr tablet, Take 3 tablets (450 mg total) by mouth daily., Disp: 270 tablet, Rfl: 1    citalopram (CELEXA) 40 MG tablet, Take 1 tablet (40 mg total) by mouth daily., Disp: 90 tablet, Rfl: 1   EPINEPHrine 0.3 mg/0.3 mL IJ SOAJ injection, INJECT 0.3 ML (0.3 MG TOTAL) INTO THE SHOULDER, THIGH, OR BUTTOCKS ONCE FOR 1 DOSE., Disp: , Rfl: 0   fenofibrate 160 MG tablet, TAKE ONE TABLET BY MOUTH DAILY, Disp: 90 tablet, Rfl: 1   glipiZIDE (GLUCOTROL XL) 10 MG 24 hr tablet, Take 1 tablet (10 mg total) by mouth daily., Disp: 90 tablet, Rfl: 1   meclizine (ANTIVERT) 25 MG tablet, Take 1 tablet (25 mg total) by mouth 2 (two) times daily as needed for dizziness., Disp: 30 tablet, Rfl: 0   predniSONE (DELTASONE) 20 MG tablet, 2 po at same time daily for 5 days, Disp: 10 tablet, Rfl: 0   propranolol (INDERAL) 40 MG tablet, Take 1 tablet (40 mg total) by mouth 2 (two) times daily. (Needs to be seen before next refill), Disp: 180 tablet, Rfl: 1   sitaGLIPtin-metformin (JANUMET) 50-1000 MG tablet, Take 1 tablet by mouth 2 (two) times daily with breakfast and lunch., Disp: , Rfl:    SUMAtriptan (IMITREX) 50 MG tablet, Take 1 tablet (50 mg total) by mouth every 2 (two) hours as needed for migraine. May repeat in 2 hours if headache persists or recurs., Disp: 10 tablet, Rfl: 5   topiramate (TOPAMAX) 50  MG tablet, TAKE 1 TABLET BY MOUTH TWO TIMES A DAY, Disp: 180 tablet, Rfl: 0   traZODone (DESYREL) 50 MG tablet, Take 0.5-1 tablets (25-50 mg total) by mouth at bedtime as needed for sleep., Disp: 30 tablet, Rfl: 3 Social History   Socioeconomic History   Marital status: Married    Spouse name: Not on file   Number of children: 0   Years of education: Not on file   Highest education level: Not on file  Occupational History   Occupation: healthcare insurer claims processor  Tobacco Use   Smoking status: Never Smoker   Smokeless tobacco: Never Used  Scientific laboratory technician Use: Never assessed  Substance and Sexual Activity   Alcohol use: No   Drug use: No   Sexual activity: Not  Currently  Other Topics Concern   Not on file  Social History Narrative   Not on file   Social Determinants of Health   Financial Resource Strain:    Difficulty of Paying Living Expenses: Not on file  Food Insecurity:    Worried About Charity fundraiser in the Last Year: Not on file   [employer] of Food in the Last Year: Not on file  Transportation Needs:    Lack of Transportation (Medical): Not on file   Lack of Transportation (Non-Medical): Not on file  Physical Activity:    Days of Exercise per Week: Not on file   Minutes of Exercise per Session: Not on file  Stress:    Feeling of Stress : Not on file  Social Connections:    Frequency of Communication with Friends and Family: Not on file   Frequency of Social Gatherings with Friends and Family: Not on file   Attends Religious Services: Not on file   Active Member of Clubs or Organizations: Not on file   Attends Archivist Meetings: Not on file   Marital Status: Not on file  Intimate Partner Violence:    Fear of Current or Ex-Partner: Not on file   Emotionally Abused: Not on file   Physically Abused: Not on file   Sexually Abused: Not on file   History reviewed. No pertinent family history.  Objective: Office vital signs reviewed. BP 118/66    Pulse 70    Temp (!) 97.4 F (36.3 C)    SpO2 97%   Physical Examination:  General: Awake, alert, well nourished, nontoxic. No acute distress HEENT: Normal, sclera white Cardio: regular rate and rhythm, S1S2 heard, no murmurs appreciated Pulm: Global expiratory wheezes.  Good air movement.  No rhonchi or rales.  Normal work of breathing on room air  Assessment/ Plan: 59 y.o. female   1. Moderate persistent asthma with exacerbation Ongoing asthma exacerbation.  She was given a corticosteroid injection here.  Chest x-ray was obtained and upon personal review demonstrated no acute pulmonary infiltrates.  She is to start prednisone burst again  tomorrow.  Continue albuterol inhaler.  We discussed reasons for return.  She voiced understanding.  She will follow-up as needed - DG Chest 2 View; Future - Novel Coronavirus, NAA ([facility]) - methylPREDNISolone acetate (DEPO-MEDROL) injection 40 mg - predniSONE (DELTASONE) 20 MG tablet; 2 po at same time daily for 3 days  Dispense: 6 tablet; Refill: 0  2. Chest congestion - DG Chest 2 View; Future - Novel Coronavirus, NAA ([facility])   Orders Placed This Encounter  Procedures   DG Chest 2 View    Standing Status:   Future  Number of Occurrences:   1    Standing Expiration Date:   04/06/2021    Order Specific Question:   Reason for Exam (SYMPTOM  OR DIAGNOSIS REQUIRED)    Answer:   cough and congestion    Order Specific Question:   Is patient pregnant?    Answer:   No    Order Specific Question:   Preferred imaging location?    Answer:   Internal   Meds ordered this encounter  Medications   methylPREDNISolone acetate (DEPO-MEDROL) injection 40 mg   predniSONE (DELTASONE) 20 MG tablet    Sig: 2 po at same time daily for 3 days    Dispense:  6 tablet    Refill:  0   benzonatate (TESSALON PERLES) 100 MG capsule    Sig: Take 1 capsule (100 mg total) by mouth 3 (three) times daily as needed.    Dispense:  20 capsule    Refill:  0   promethazine-dextromethorphan (PROMETHAZINE-DM) 6.25-15 MG/5ML syrup    Sig: Take 2.5 mLs by mouth 4 (four) times daily as needed for cough.    Dispense:  118 mL    Refill:  [place], DO [place] [phone]

## 2020-04-07 LAB — SARS-COV-2, NAA 2 DAY TAT

## 2020-04-07 LAB — NOVEL CORONAVIRUS, NAA: SARS-CoV-2, NAA: NOT DETECTED

## 2020-05-03 ENCOUNTER — Ambulatory Visit (INDEPENDENT_AMBULATORY_CARE_PROVIDER_SITE_OTHER): Payer: Self-pay | Admitting: Family

## 2020-05-03 ENCOUNTER — Encounter: Payer: Self-pay | Admitting: Family

## 2020-05-03 DIAGNOSIS — R197 Diarrhea, unspecified: Secondary | ICD-10-CM

## 2020-05-03 DIAGNOSIS — K529 Noninfective gastroenteritis and colitis, unspecified: Secondary | ICD-10-CM

## 2020-05-03 MED ORDER — ONDANSETRON HCL 4 MG PO TABS: 4.0000 mg | ORAL_TABLET | Freq: Three times a day (TID) | ORAL | 0 refills | Status: DC | PRN

## 2020-05-03 NOTE — Progress Notes (Signed)
   Virtual Visit via telephone Note Due to COVID-19 pandemic this visit was conducted virtually. This visit type was conducted due to national recommendations for restrictions regarding the COVID-19 Pandemic (e.g. social distancing, sheltering in place) in an effort to limit this patient's exposure and mitigate transmission in our community. All issues noted in this document were discussed and addressed.  A physical exam was not performed with this format.  I connected with [name] on 05/03/20 at 12:36 pm  by telephone and verified that I am speaking with the correct person using two identifiers. [name] is currently located at home and  No one  is currently with her during visit. The provider, Evelina Dun, FNP is located in their office at time of visit.  I discussed the limitations, risks, security and privacy concerns of performing an evaluation and management service by telephone and the availability of in person appointments. I also discussed with the patient that there may be a patient responsible charge related to this service. The patient expressed understanding and agreed to proceed.   History and Present Illness:  Diarrhea  This is a new problem. The current episode started yesterday. The problem occurs 5 to 10 times per day. The problem has been unchanged. Associated symptoms include abdominal pain, bloating and a fever (99 F). Pertinent negatives include no chills, coughing, increased  flatus, myalgias, sweats or vomiting. Associated symptoms comments: nausea. Nothing aggravates the symptoms. There are no known risk factors. She has tried increased fluids and anti-motility drug for the symptoms. The treatment provided mild relief.      Review of Systems  Constitutional: Positive for fever (99 F). Negative for chills.  Respiratory: Negative for cough.   Gastrointestinal: Positive for abdominal pain, bloating and diarrhea. Negative for flatus and vomiting.    Musculoskeletal: Negative for myalgias.  All other systems reviewed and are negative.    Observations/Objective: No SOB or distress noted   Assessment and Plan: 1. Diarrhea, unspecified type  2. Gastroenteritis Bland diet Force fluids Tylenol as needed  Work note given  Zofran as needed  Call if symptoms worsen or do not improve  - ondansetron (ZOFRAN) 4 MG tablet; Take 1 tablet (4 mg total) by mouth every 8 (eight) hours as needed for nausea or vomiting.  Dispense: 20 tablet; Refill: 0    I discussed the assessment and treatment plan with the patient. The patient was provided an opportunity to ask questions and all were answered. The patient agreed with the plan and demonstrated an understanding of the instructions.   The patient was advised to call back or seek an in-person evaluation if the symptoms worsen or if the condition fails to improve as anticipated.  The above assessment and management plan was discussed with the patient. The patient verbalized understanding of and has agreed to the management plan. Patient is aware to call the clinic if symptoms persist or worsen. Patient is aware when to return to the clinic for a follow-up visit. Patient educated on when it is appropriate to go to the emergency department.   Time call ended:  12:47 pm  I provided 11 minutes of non-face-to-face time during this encounter.    Evelina Dun, FNP

## 2020-05-04 ENCOUNTER — Telehealth: Payer: Self-pay

## 2020-05-04 NOTE — Telephone Encounter (Signed)
janumet XR samples given to patient

## 2020-05-04 NOTE — Telephone Encounter (Signed)
We have no samples of that right now patient asking if there is a substituent that can be made

## 2020-06-23 ENCOUNTER — Other Ambulatory Visit: Payer: Self-pay | Admitting: Nurse Practitioner

## 2020-06-23 DIAGNOSIS — F3341 Major depressive disorder, recurrent, in partial remission: Secondary | ICD-10-CM

## 2020-06-23 DIAGNOSIS — F411 Generalized anxiety disorder: Secondary | ICD-10-CM

## 2020-06-23 DIAGNOSIS — I10 Essential (primary) hypertension: Secondary | ICD-10-CM

## 2020-06-23 DIAGNOSIS — E1142 Type 2 diabetes mellitus with diabetic polyneuropathy: Secondary | ICD-10-CM

## 2020-06-23 DIAGNOSIS — G43711 Chronic migraine without aura, intractable, with status migrainosus: Secondary | ICD-10-CM

## 2020-06-23 DIAGNOSIS — E782 Mixed hyperlipidemia: Secondary | ICD-10-CM

## 2020-06-23 MED ORDER — TOPIRAMATE 50 MG PO TABS: 50.0000 mg | ORAL_TABLET | Freq: Two times a day (BID) | ORAL | 0 refills | Status: DC

## 2020-06-23 MED ORDER — PROPRANOLOL HCL 40 MG PO TABS: 40.0000 mg | ORAL_TABLET | Freq: Two times a day (BID) | ORAL | 0 refills | Status: DC

## 2020-06-23 MED ORDER — FENOFIBRATE 160 MG PO TABS: 160.0000 mg | ORAL_TABLET | Freq: Every day | ORAL | 0 refills | Status: DC

## 2020-06-23 MED ORDER — BENAZEPRIL HCL 20 MG PO TABS: 20.0000 mg | ORAL_TABLET | Freq: Every day | ORAL | 0 refills | Status: DC

## 2020-07-01 ENCOUNTER — Ambulatory Visit: Payer: Self-pay | Admitting: Family Medicine

## 2020-07-13 ENCOUNTER — Ambulatory Visit: Payer: Self-pay | Admitting: Family Medicine

## 2020-07-14 ENCOUNTER — Ambulatory Visit: Payer: Self-pay | Admitting: Family Medicine

## 2020-08-02 ENCOUNTER — Other Ambulatory Visit: Payer: Self-pay | Admitting: Nurse Practitioner

## 2020-08-02 DIAGNOSIS — E1142 Type 2 diabetes mellitus with diabetic polyneuropathy: Secondary | ICD-10-CM

## 2020-08-02 NOTE — Telephone Encounter (Signed)
MMM NTBS 30 days given 06/23/20

## 2020-08-03 NOTE — Telephone Encounter (Signed)
Appt made for tomorrow.

## 2020-08-04 ENCOUNTER — Encounter: Payer: Self-pay | Admitting: Nurse Practitioner

## 2020-08-04 ENCOUNTER — Other Ambulatory Visit: Payer: Self-pay

## 2020-08-04 ENCOUNTER — Ambulatory Visit (INDEPENDENT_AMBULATORY_CARE_PROVIDER_SITE_OTHER): Payer: 59 | Admitting: Nurse Practitioner

## 2020-08-04 VITALS — BP 106/61 | HR 81 | Temp 97.5°F | Resp 20 | Ht 64.0 in | Wt 216.0 lb

## 2020-08-04 DIAGNOSIS — G43711 Chronic migraine without aura, intractable, with status migrainosus: Secondary | ICD-10-CM

## 2020-08-04 DIAGNOSIS — K5790 Diverticulosis of intestine, part unspecified, without perforation or abscess without bleeding: Secondary | ICD-10-CM

## 2020-08-04 DIAGNOSIS — E782 Mixed hyperlipidemia: Secondary | ICD-10-CM

## 2020-08-04 DIAGNOSIS — I10 Essential (primary) hypertension: Secondary | ICD-10-CM | POA: Diagnosis not present

## 2020-08-04 DIAGNOSIS — F3341 Major depressive disorder, recurrent, in partial remission: Secondary | ICD-10-CM

## 2020-08-04 DIAGNOSIS — S90852A Superficial foreign body, left foot, initial encounter: Secondary | ICD-10-CM

## 2020-08-04 DIAGNOSIS — E039 Hypothyroidism, unspecified: Secondary | ICD-10-CM | POA: Diagnosis not present

## 2020-08-04 DIAGNOSIS — E1142 Type 2 diabetes mellitus with diabetic polyneuropathy: Secondary | ICD-10-CM | POA: Diagnosis not present

## 2020-08-04 DIAGNOSIS — F411 Generalized anxiety disorder: Secondary | ICD-10-CM

## 2020-08-04 LAB — BAYER DCA HB A1C WAIVED: HB A1C (BAYER DCA - WAIVED): 7 % — ABNORMAL HIGH (ref <7.0)

## 2020-08-04 MED ORDER — BUPROPION HCL ER (XL) 150 MG PO TB24: 450.0000 mg | ORAL_TABLET | Freq: Every day | ORAL | 0 refills | Status: DC

## 2020-08-04 MED ORDER — CITALOPRAM HYDROBROMIDE 40 MG PO TABS: 40.0000 mg | ORAL_TABLET | Freq: Every day | ORAL | 0 refills | Status: DC

## 2020-08-04 MED ORDER — TOPIRAMATE 50 MG PO TABS: 50.0000 mg | ORAL_TABLET | Freq: Two times a day (BID) | ORAL | 0 refills | Status: DC

## 2020-08-04 MED ORDER — GLIPIZIDE ER 10 MG PO TB24: 10.0000 mg | ORAL_TABLET | Freq: Every day | ORAL | 0 refills | Status: DC

## 2020-08-04 MED ORDER — SULFAMETHOXAZOLE-TRIMETHOPRIM 800-160 MG PO TABS: 1.0000 | ORAL_TABLET | Freq: Two times a day (BID) | ORAL | 0 refills | Status: DC

## 2020-08-04 MED ORDER — FENOFIBRATE 160 MG PO TABS: 160.0000 mg | ORAL_TABLET | Freq: Every day | ORAL | 0 refills | Status: DC

## 2020-08-04 MED ORDER — BENAZEPRIL HCL 20 MG PO TABS: 20.0000 mg | ORAL_TABLET | Freq: Every day | ORAL | 0 refills | Status: DC

## 2020-08-04 MED ORDER — ATORVASTATIN CALCIUM 40 MG PO TABS: 40.0000 mg | ORAL_TABLET | Freq: Every day | ORAL | 0 refills | Status: DC

## 2020-08-04 NOTE — Progress Notes (Signed)
Subjective:    Patient ID: [name], female    DOB: [birth year], 60 y.o.   MRN: [id number]   Chief Complaint: medical management of chronic issues     HPI:  1. Primary hypertension No c/o chest pain, sob or headache.doesnot check blood presure at home. BP Readings from Last 3 Encounters:  08/04/20 106/61  04/06/20 118/66  12/23/19 121/65      2. Mixed hyperlipidemia Does not watch diet and does no dedicated exercise Lab Results  Component Value Date   CHOL 288 (H) 12/23/2019   HDL 38 (L) 12/23/2019   LDLCALC 172 (H) 12/23/2019   LDLDIRECT 211 (H) 04/28/2019   TRIG 401 (H) 12/23/2019   CHOLHDL 7.6 (H) 12/23/2019   The 10-year ASCVD risk score Mikey Bussing DC Jr., et al., 2013) is: 11.3%   3. Acquired hypothyroidism No problems that she is aware of. Lab Results  Component Value Date   TSH 3.530 01/15/2019     4. Type 2 diabetes mellitus with diabetic polyneuropathy, without long-term current use of insulin (HCC) She has not been checking her blood sugars. She has bene trying to eat better. Denies any symptoms of low blood sugar. We put her back on janumet and glipizide at last appointment. Lab Results  Component Value Date   HGBA1C 10.6 (H) 12/23/2019     5. Diverticulosis No recent flare ups.  6. Anxiety state Is on wellbutrin daily and is doing well. GAD 7 : Generalized Anxiety Score 08/04/2020 04/28/2019  Nervous, Anxious, on Edge 1 1  Control/stop worrying 1 1  Worry too much - different things 1 1  Trouble relaxing 1 1  Restless 0 0  Easily annoyed or irritable 0 0  Afraid - awful might happen 1 1  Total GAD 7 Score 5 5  Anxiety Difficulty Not difficult at all -      7. Recurrent major depressive disorder, in partial remission ([place]) Is on celexa daily and is doing well. She denies any medication side effects. Depression screen [facility] 2/9 08/04/2020 04/06/2020 12/23/2019  Decreased Interest 0 0 0  Down, Depressed, Hopeless 0 0 0  PHQ - 2  Score 0 0 0  Altered sleeping 2 - -  Tired, decreased energy 2 - -  Change in appetite 1 - -  Feeling bad or failure about yourself  1 - -  Trouble concentrating 0 - -  Moving slowly or fidgety/restless 0 - -  Suicidal thoughts 0 - -  PHQ-9 Score 6 - -  Difficult doing work/chores Not difficult at all - -     8. Morbid obesity ([place]) Weight is down 11lbs  Wt Readings from Last 3 Encounters:  08/04/20 216 lb (98 kg)  12/23/19 227 lb (103 kg)  04/28/19 231 lb (104.8 kg)   BMI Readings from Last 3 Encounters:  08/04/20 37.08 kg/m  12/23/19 38.96 kg/m  04/28/19 39.65 kg/m   9. Migraines Is on topamax daily and is doing well. Still has occasional migraines, but are tolerable.    Outpatient Encounter Medications as of 08/04/2020  Medication Sig  . albuterol (VENTOLIN HFA) 108 (90 Base) MCG/ACT inhaler Inhale 2 puffs into the lungs every 4 (four) hours as needed for wheezing or shortness of breath. (NEEDS TO BE SEEN)  . atorvastatin (LIPITOR) 40 MG tablet Take 1 tablet (40 mg total) by mouth daily. (Needs to be seen before next refill)  . azithromycin (ZITHROMAX) 250 MG tablet Take 2 the first day and then one  each day after.  . benazepril (LOTENSIN) 20 MG tablet Take 1 tablet (20 mg total) by mouth daily. (Needs to be seen before next refill)  . benzonatate (TESSALON PERLES) 100 MG capsule Take 1 capsule (100 mg total) by mouth 3 (three) times daily as needed.  Marland Kitchen buPROPion (WELLBUTRIN XL) 150 MG 24 hr tablet Take 3 tablets (450 mg total) by mouth daily. (Needs to be seen before next refill)  . citalopram (CELEXA) 40 MG tablet Take 1 tablet (40 mg total) by mouth daily. (Needs to be seen before next refill)  . EPINEPHrine 0.3 mg/0.3 mL IJ SOAJ injection INJECT 0.3 ML (0.3 MG TOTAL) INTO THE SHOULDER, THIGH, OR BUTTOCKS ONCE FOR 1 DOSE.  . fenofibrate 160 MG tablet Take 1 tablet (160 mg total) by mouth daily. (Needs to be seen before next refill)  . glipiZIDE (GLUCOTROL XL) 10 MG 24  hr tablet Take 1 tablet (10 mg total) by mouth daily. (Needs to be seen before next refill)  . meclizine (ANTIVERT) 25 MG tablet Take 1 tablet (25 mg total) by mouth 2 (two) times daily as needed for dizziness.  . ondansetron (ZOFRAN) 4 MG tablet Take 1 tablet (4 mg total) by mouth every 8 (eight) hours as needed for nausea or vomiting.  . predniSONE (DELTASONE) 20 MG tablet 2 po at same time daily for 3 days  . promethazine-dextromethorphan (PROMETHAZINE-DM) 6.25-15 MG/5ML syrup Take 2.5 mLs by mouth 4 (four) times daily as needed for cough.  . propranolol (INDERAL) 40 MG tablet Take 1 tablet (40 mg total) by mouth 2 (two) times daily. (Needs to be seen before next refill)  . sitaGLIPtin-metformin (JANUMET) 50-1000 MG tablet Take 1 tablet by mouth 2 (two) times daily with breakfast and lunch.  . SUMAtriptan (IMITREX) 50 MG tablet Take 1 tablet (50 mg total) by mouth every 2 (two) hours as needed for migraine. May repeat in 2 hours if headache persists or recurs.  . topiramate (TOPAMAX) 50 MG tablet Take 1 tablet (50 mg total) by mouth 2 (two) times daily. (Needs to be seen before next refill)  . traZODone (DESYREL) 50 MG tablet Take 0.5-1 tablets (25-50 mg total) by mouth at bedtime as needed for sleep.     Past Surgical History:  Procedure Laterality Date  . ABDOMINAL HYSTERECTOMY  02/2007   + lysis of adhesions  . APPENDECTOMY  1990s   ruptured, late 90s  . BILATERAL OOPHORECTOMY      in 2 surgeries prior to 9/08  . COLONOSCOPY  02/2010    normal upper endoscopy, single colonic polyp,  . VENTRAL HERNIA REPAIR     x3    No family history on file.  New complaints: Went outside barefooted and got a splinterin her left foot from her porch. She does not think she got the entire splinter out. Area is red and sore to touch.  Social history: Husband passed away last year. 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  Eyes: Negative for pain.  Respiratory: Negative for shortness of breath.   Cardiovascular: Negative for chest pain, palpitations and leg swelling.  Gastrointestinal: Negative for abdominal pain.  Endocrine: Negative for polydipsia.  Skin: Negative for rash.  Neurological: Negative for dizziness, weakness and headaches.  Hematological: Does not bruise/bleed easily.  All other systems reviewed and are negative.      Objective:   Physical Exam Vitals and  nursing note reviewed.  Constitutional:      General: She is not in acute distress.    Appearance: Normal appearance. She is well-developed and well-nourished.  HENT:     Head: Normocephalic.     Nose: Nose normal.     Mouth/Throat:     Mouth: Oropharynx is clear and moist.  Eyes:     Extraocular Movements: EOM normal.     Pupils: Pupils are equal, round, and reactive to light.  Neck:     Vascular: No carotid bruit or JVD.  Cardiovascular:     Rate and Rhythm: Normal rate and regular rhythm.     Pulses: Intact distal pulses.     Heart sounds: Normal heart sounds.  Pulmonary:     Effort: Pulmonary effort is normal. No respiratory distress.     Breath sounds: Normal breath sounds. No wheezing or rales.  Chest:     Chest wall: No tenderness.  Abdominal:     General: Bowel sounds are normal. There is no distension or abdominal bruit. Aorta is normal.     Palpations: Abdomen is soft. There is no hepatomegaly, splenomegaly, mass or pulsatile mass.     Tenderness: There is no abdominal tenderness.  Musculoskeletal:        General: No edema. Normal range of motion.     Cervical back: Normal range of motion and neck supple.  Lymphadenopathy:     Cervical: No cervical adenopathy.  Skin:    General: Skin is warm and dry.     Comments: Tender black area on ball of left foot. No foreign body seen  Neurological:     Mental Status: She is alert and oriented to person, place, and time.     Deep Tendon  Reflexes: Reflexes are normal and symmetric.  Psychiatric:        Mood and Affect: Mood and affect normal.        Behavior: Behavior normal.        Thought Content: Thought content normal.        Judgment: Judgment normal.     BP 106/61   Pulse 81   Temp (!) 97.5 F (36.4 C) (Temporal)   Resp 20   Ht 5' 4"  (1.626 m)   Wt 216 lb (98 kg)   SpO2 97%   BMI 37.08 kg/m   hgba1c 7.0%       Assessment & Plan:  BARB SHEAR comes in today with chief complaint of Medical Management of Chronic Issues   Diagnosis and orders addressed:  1. Primary hypertension Low sodium diet - CBC with Differential/Platelet - CMP14+EGFR  2. Mixed hyperlipidemia Low fat diet - Lipid panel  3. Acquired hypothyroidism Labs oending  4. Type 2 diabetes mellitus with diabetic polyneuropathy, without long-term current use of insulin (HCC) contniue to wtahc carbsin diet - Bayer DCA Hb A1c Waived - Microalbumin / creatinine urine ratio  5. Diverticulosis Watch diet to prevent flare up  6. Anxiety state Stress managment  7. Recurrent major depressive disorder, in partial remission ([place])   8. Morbid obesity ([place]) Discussed diet and exercise for person with BMI >25 Will recheck weight in 3-6 months  9. Foreign body in left foot, initial encounter Contacted DR. DRake for appointmnet - sulfamethoxazole-trimethoprim (BACTRIM DS) 800-160 MG tablet; Take 1 tablet by mouth 2 (two) times daily.  Dispense: 20 tablet; Refill: 0  10. Intractable chronic migraine without aura and with status migrainosus Avoid caffeine   Labs pending Health Maintenance reviewed-  will make mammogram appointment Diet and exercise encouraged  Follow up plan: 3 months   Mary-Margaret Hassell Done, FNP

## 2020-08-04 NOTE — Patient Instructions (Signed)
Diabetes Mellitus and Foot Care Foot care is an important part of your health, especially when you have diabetes. Diabetes may cause you to have problems because of poor blood flow (circulation) to your feet and legs, which can cause your skin to:  Become thinner and drier.  Break more easily.  Heal more slowly.  Peel and crack. You may also have nerve damage (neuropathy) in your legs and feet, causing decreased feeling in them. This means that you may not notice minor injuries to your feet that could lead to more serious problems. Noticing and addressing any potential problems early is the best way to prevent future foot problems. How to care for your feet Foot hygiene  Wash your feet daily with warm water and mild soap. Do not use hot water. Then, pat your feet and the areas between your toes until they are completely dry. Do not soak your feet as this can dry your skin.  Trim your toenails straight across. Do not dig under them or around the cuticle. File the edges of your nails with an emery board or nail file.  Apply a moisturizing lotion or petroleum jelly to the skin on your feet and to dry, brittle toenails. Use lotion that does not contain alcohol and is unscented. Do not apply lotion between your toes.   Shoes and socks  Wear clean socks or stockings every day. Make sure they are not too tight. Do not wear knee-high stockings since they may decrease blood flow to your legs.  Wear shoes that fit properly and have enough cushioning. Always look in your shoes before you put them on to be sure there are no objects inside.  To break in new shoes, wear them for just a few hours a day. This prevents injuries on your feet. Wounds, scrapes, corns, and calluses  Check your feet daily for blisters, cuts, bruises, sores, and redness. If you cannot see the bottom of your feet, use a mirror or ask someone for help.  Do not cut corns or calluses or try to remove them with medicine.  If you  find a minor scrape, cut, or break in the skin on your feet, keep it and the skin around it clean and dry. You may clean these areas with mild soap and water. Do not clean the area with peroxide, alcohol, or iodine.  If you have a wound, scrape, corn, or callus on your foot, look at it several times a day to make sure it is healing and not infected. Check for: ? Redness, swelling, or pain. ? Fluid or blood. ? Warmth. ? Pus or a bad smell.   General tips  Do not cross your legs. This may decrease blood flow to your feet.  Do not use heating pads or hot water bottles on your feet. They may burn your skin. If you have lost feeling in your feet or legs, you may not know this is happening until it is too late.  Protect your feet from hot and cold by wearing shoes, such as at the beach or on hot pavement.  Schedule a complete foot exam at least once a year (annually) or more often if you have foot problems. Report any cuts, sores, or bruises to your health care provider immediately. Where to find more information  American Diabetes Association: www.diabetes.org  Association of Diabetes Care & Education Specialists: www.diabeteseducator.org Contact a health care provider if:  You have a medical condition that increases your risk of infection and   you have any cuts, sores, or bruises on your feet.  You have an injury that is not healing.  You have redness on your legs or feet.  You feel burning or tingling in your legs or feet.  You have pain or cramps in your legs and feet.  Your legs or feet are numb.  Your feet always feel cold.  You have pain around any toenails. Get help right away if:  You have a wound, scrape, corn, or callus on your foot and: ? You have pain, swelling, or redness that gets worse. ? You have fluid or blood coming from the wound, scrape, corn, or callus. ? Your wound, scrape, corn, or callus feels warm to the touch. ? You have pus or a bad smell coming from  the wound, scrape, corn, or callus. ? You have a fever. ? You have a red line going up your leg. Summary  Check your feet every day for blisters, cuts, bruises, sores, and redness.  Apply a moisturizing lotion or petroleum jelly to the skin on your feet and to dry, brittle toenails.  Wear shoes that fit properly and have enough cushioning.  If you have foot problems, report any cuts, sores, or bruises to your health care provider immediately.  Schedule a complete foot exam at least once a year (annually) or more often if you have foot problems. This information is not intended to replace advice given to you by your health care provider. Make sure you discuss any questions you have with your health care provider. Document Revised: 12/17/2019 Document Reviewed: 12/17/2019 Elsevier Patient Education  2021 [employer]  

## 2020-08-05 LAB — CBC WITH DIFFERENTIAL/PLATELET
Basophils Absolute: 0 10*3/uL (ref 0.0–0.2)
Basos: 1 %
EOS (ABSOLUTE): 0.1 10*3/uL (ref 0.0–0.4)
Eos: 2 %
Hematocrit: 35.5 % (ref 34.0–46.6)
Hemoglobin: 11.9 g/dL (ref 11.1–15.9)
Immature Grans (Abs): 0.1 10*3/uL (ref 0.0–0.1)
Immature Granulocytes: 1 %
Lymphocytes Absolute: 1.8 10*3/uL (ref 0.7–3.1)
Lymphs: 26 %
MCH: 29.6 pg (ref 26.6–33.0)
MCHC: 33.5 g/dL (ref 31.5–35.7)
MCV: 88 fL (ref 79–97)
Monocytes Absolute: 0.5 10*3/uL (ref 0.1–0.9)
Monocytes: 6 %
Neutrophils Absolute: 4.7 10*3/uL (ref 1.4–7.0)
Neutrophils: 64 %
Platelets: 205 10*3/uL (ref 150–450)
RBC: 4.02 x10E6/uL (ref 3.77–5.28)
RDW: 12.8 % (ref 11.7–15.4)
WBC: 7.2 10*3/uL (ref 3.4–10.8)

## 2020-08-05 LAB — CMP14+EGFR
ALT: 51 IU/L — ABNORMAL HIGH (ref 0–32)
AST: 25 IU/L (ref 0–40)
Albumin/Globulin Ratio: 1.8 (ref 1.2–2.2)
Albumin: 4.4 g/dL (ref 3.8–4.9)
Alkaline Phosphatase: 63 IU/L (ref 44–121)
BUN/Creatinine Ratio: 22 (ref 9–23)
BUN: 24 mg/dL (ref 6–24)
Bilirubin Total: 0.2 mg/dL (ref 0.0–1.2)
CO2: 19 mmol/L — ABNORMAL LOW (ref 20–29)
Calcium: 9.8 mg/dL (ref 8.7–10.2)
Chloride: 102 mmol/L (ref 96–106)
Creatinine, Ser: 1.11 mg/dL — ABNORMAL HIGH (ref 0.57–1.00)
GFR calc Af Amer: 63 mL/min/{1.73_m2} (ref >59)
GFR calc non Af Amer: 54 mL/min/{1.73_m2} — ABNORMAL LOW (ref >59)
Globulin, Total: 2.5 g/dL (ref 1.5–4.5)
Glucose: 220 mg/dL — ABNORMAL HIGH (ref 65–99)
Potassium: 4.9 mmol/L (ref 3.5–5.2)
Sodium: 137 mmol/L (ref 134–144)
Total Protein: 6.9 g/dL (ref 6.0–8.5)

## 2020-08-05 LAB — MICROALBUMIN / CREATININE URINE RATIO
Creatinine, Urine: 103.7 mg/dL
Microalb/Creat Ratio: 7 mg/g creat (ref 0–29)
Microalbumin, Urine: 7.4 ug/mL

## 2020-08-05 LAB — LIPID PANEL
Chol/HDL Ratio: 6.6 ratio — ABNORMAL HIGH (ref 0.0–4.4)
Cholesterol, Total: 322 mg/dL — ABNORMAL HIGH (ref 100–199)
HDL: 49 mg/dL (ref >39)
LDL Chol Calc (NIH): 217 mg/dL — ABNORMAL HIGH (ref 0–99)
Triglycerides: 276 mg/dL — ABNORMAL HIGH (ref 0–149)
VLDL Cholesterol Cal: 56 mg/dL — ABNORMAL HIGH (ref 5–40)

## 2020-08-12 IMAGING — CT CT HEAD W/O CM
4 series · 16 of 47 positions shown, 18 images · non-contrast
Comparison: Prior MRI from 12/04/2012.

CLINICAL DATA: Initial evaluation for acute headache, nausea,
vertigo.

EXAM:
CT HEAD WITHOUT CONTRAST
TECHNIQUE: Contiguous axial images were obtained from the base of the skull
through the vertex without intravenous contrast.

[Series 3: head wo · axial · 0.46mm/px · z∈[-106,+14]mm · 7 of 33 slices shown, 9 images]
[im 5/33  brain]
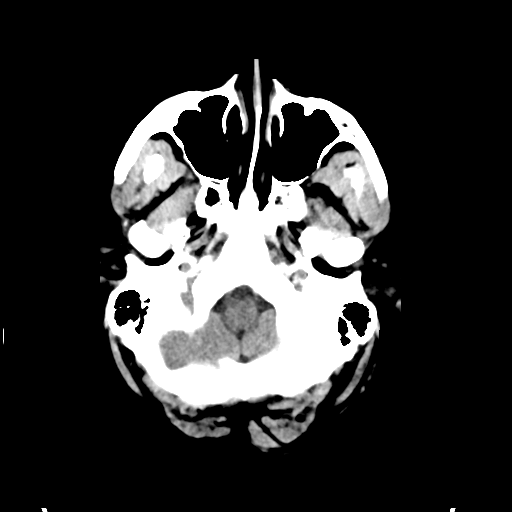
[im 5/33  bone]
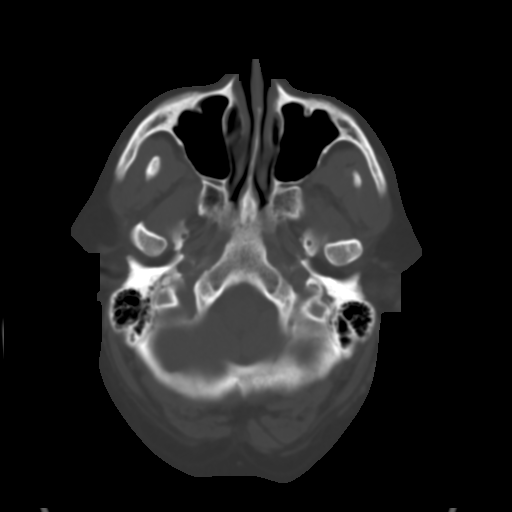
[im 9/33  brain]
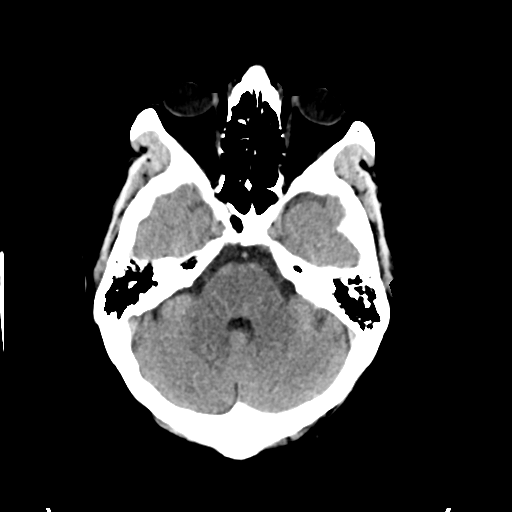
[im 13/33  brain]
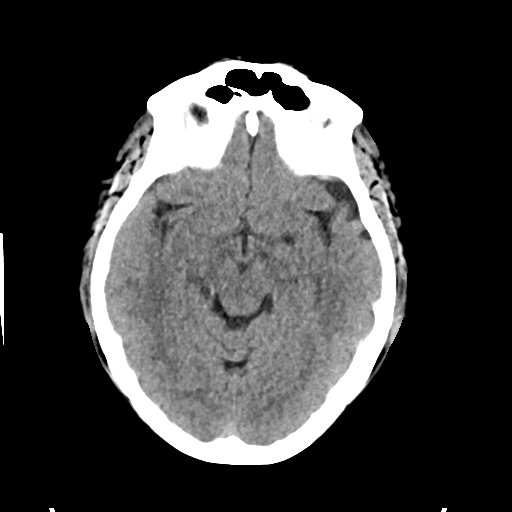
[im 17/33  brain]
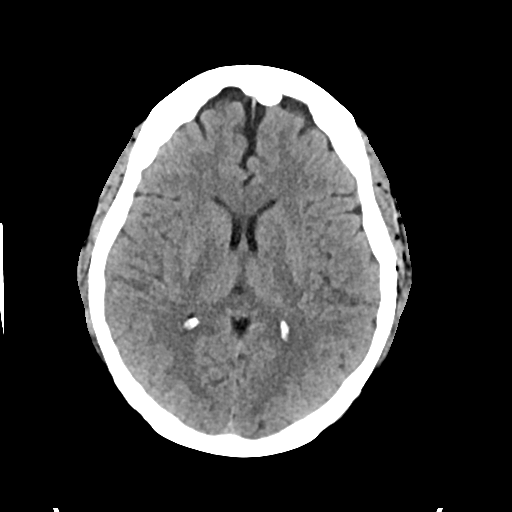
[im 21/33  brain]
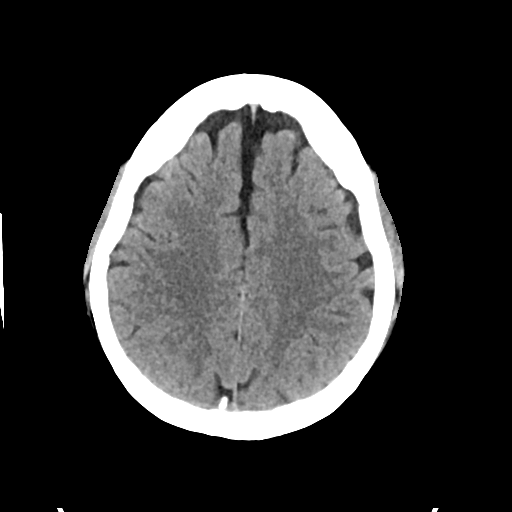
[im 21/33  bone]
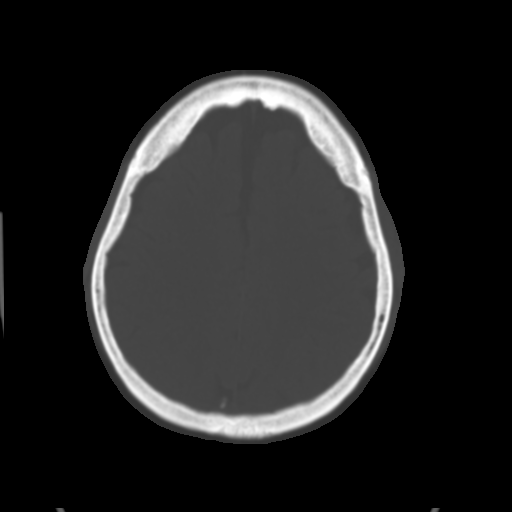
[im 25/33  brain]
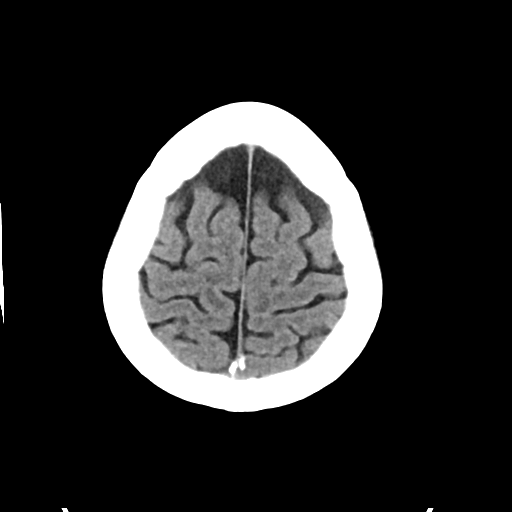
[im 29/33  brain]
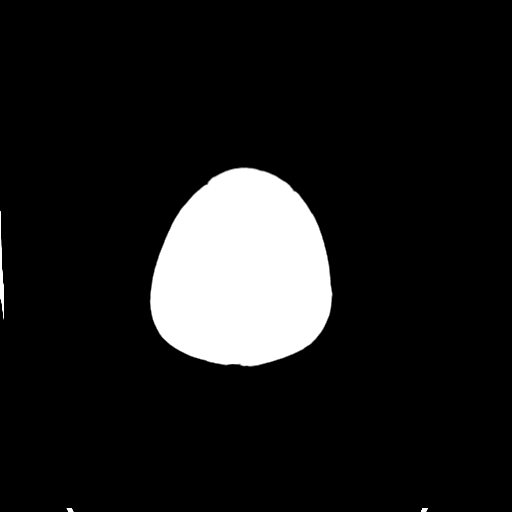

[Series 4: head bone · axial · 0.46mm/px · z∈[-110,-78]mm · 3 of 83 slices shown]
[im 9/83  bone]
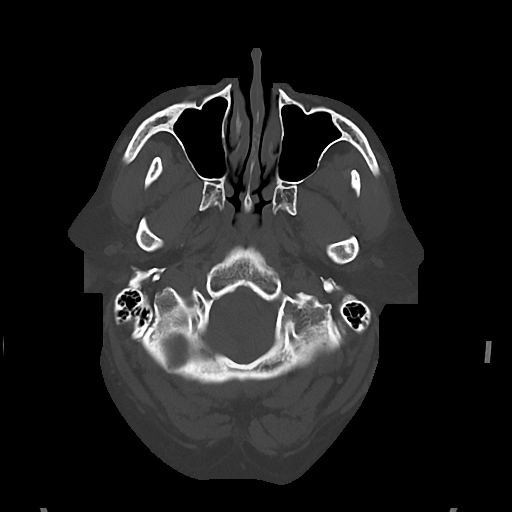
[im 17/83  bone]
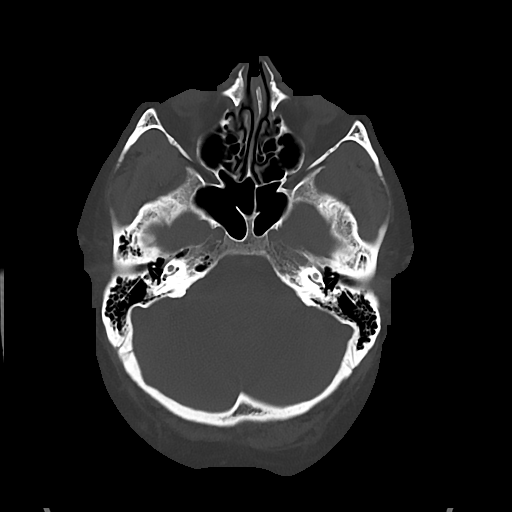
[im 25/83  bone]
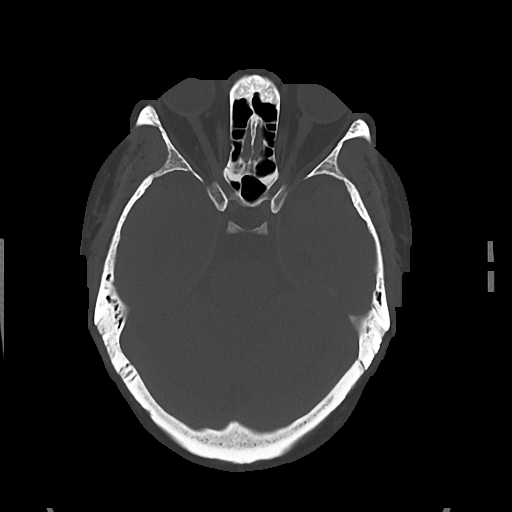

[Series 5: cor soft · coronal · 0.33mm/px · 3 of 67 slices shown]
[im 23/67  brain]
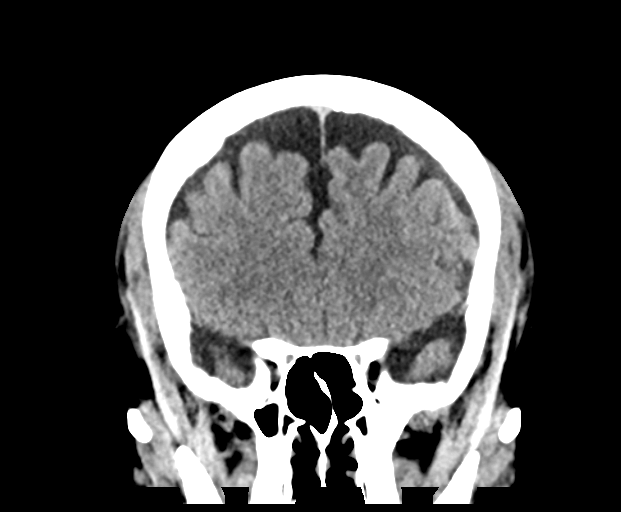
[im 30/67  brain]
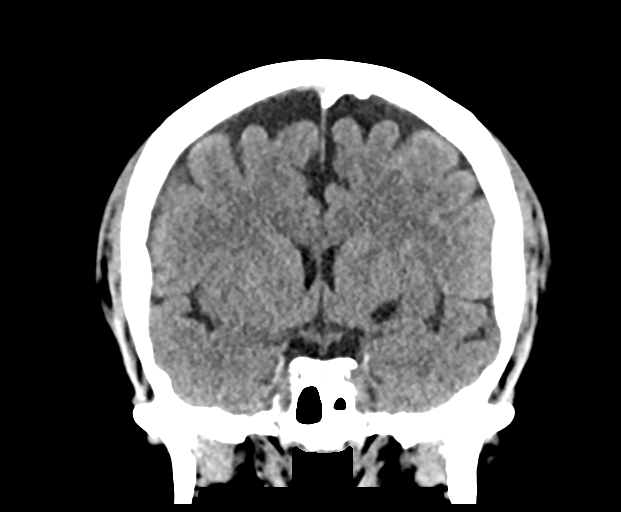
[im 37/67  brain]
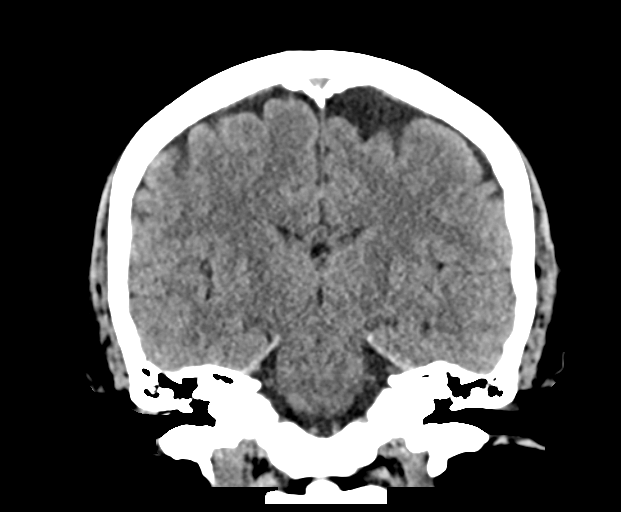

[Series 6: sag soft · sagittal · 0.32mm/px · 3 of 64 slices shown]
[im 22/64  brain]
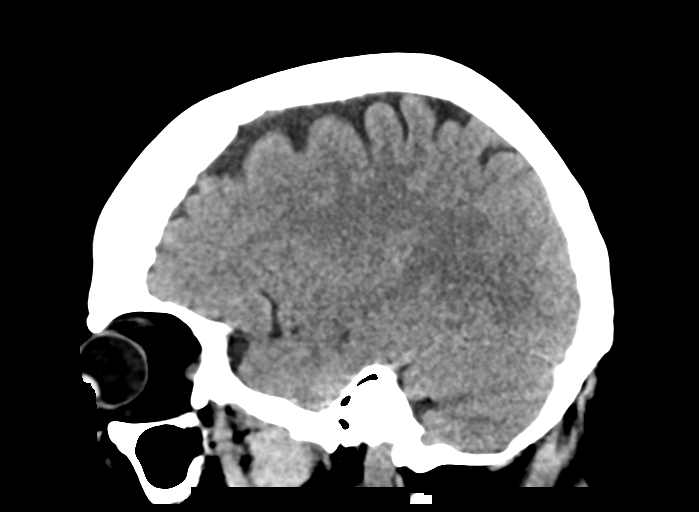
[im 32/64  brain]
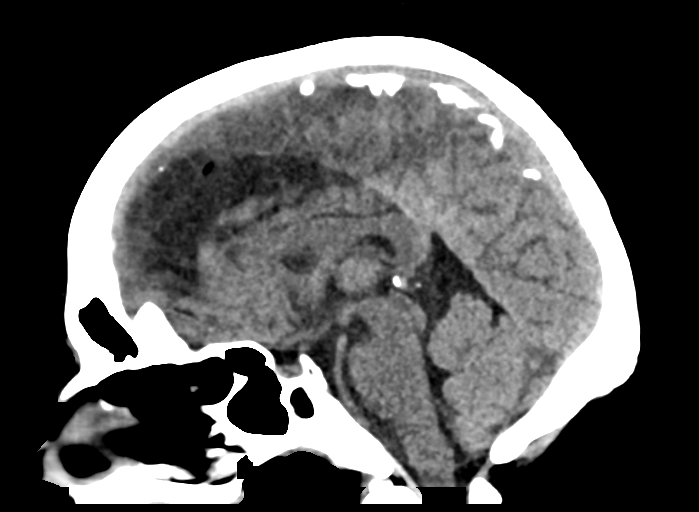
[im 43/64  brain]
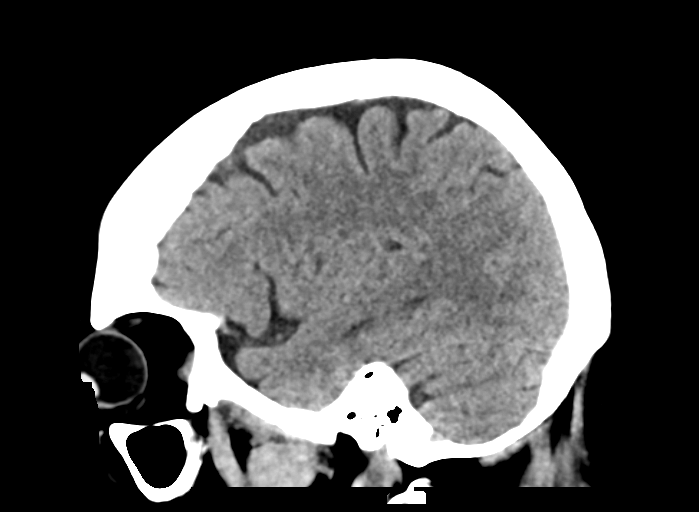

[16 of 47 positions shown; findings below may reference images not displayed]

FINDINGS: Brain: Cerebral volume within normal limits for patient age.

No evidence for acute intracranial hemorrhage. No findings to
suggest acute large vessel territory infarct. No mass lesion,
midline shift, or mass effect. Ventricles are normal in size without
evidence for hydrocephalus. No extra-axial fluid collection
identified.

Vascular: No hyperdense vessel identified.

Skull: Scalp soft tissues demonstrate no acute abnormality.
Calvarium intact.

Sinuses/Orbits: Globes and orbital soft tissues within normal
limits.

Visualized paranasal sinuses are clear. No mastoid effusion.
IMPRESSION: Negative head CT.  No acute intracranial abnormality identified.

## 2020-08-31 ENCOUNTER — Other Ambulatory Visit: Payer: Self-pay | Admitting: Nurse Practitioner

## 2020-08-31 DIAGNOSIS — G43711 Chronic migraine without aura, intractable, with status migrainosus: Secondary | ICD-10-CM

## 2020-08-31 DIAGNOSIS — F411 Generalized anxiety disorder: Secondary | ICD-10-CM

## 2020-09-01 ENCOUNTER — Other Ambulatory Visit: Payer: Self-pay | Admitting: *Deleted

## 2020-09-01 DIAGNOSIS — F3341 Major depressive disorder, recurrent, in partial remission: Secondary | ICD-10-CM

## 2020-09-01 DIAGNOSIS — E1142 Type 2 diabetes mellitus with diabetic polyneuropathy: Secondary | ICD-10-CM

## 2020-09-01 DIAGNOSIS — E782 Mixed hyperlipidemia: Secondary | ICD-10-CM

## 2020-09-01 MED ORDER — CITALOPRAM HYDROBROMIDE 40 MG PO TABS: 40.0000 mg | ORAL_TABLET | Freq: Every day | ORAL | 1 refills | Status: DC

## 2020-09-01 MED ORDER — ATORVASTATIN CALCIUM 40 MG PO TABS: 40.0000 mg | ORAL_TABLET | Freq: Every day | ORAL | 4 refills | Status: DC

## 2020-09-01 MED ORDER — GLIPIZIDE ER 10 MG PO TB24: 10.0000 mg | ORAL_TABLET | Freq: Every day | ORAL | 1 refills | Status: DC

## 2020-09-01 MED ORDER — FENOFIBRATE 160 MG PO TABS: 160.0000 mg | ORAL_TABLET | Freq: Every day | ORAL | 4 refills | Status: DC

## 2020-09-30 ENCOUNTER — Other Ambulatory Visit: Payer: Self-pay | Admitting: Nurse Practitioner

## 2020-09-30 DIAGNOSIS — I10 Essential (primary) hypertension: Secondary | ICD-10-CM

## 2020-10-05 ENCOUNTER — Other Ambulatory Visit: Payer: Self-pay | Admitting: Nurse Practitioner

## 2020-10-05 DIAGNOSIS — G43711 Chronic migraine without aura, intractable, with status migrainosus: Secondary | ICD-10-CM

## 2020-10-06 ENCOUNTER — Other Ambulatory Visit: Payer: Self-pay | Admitting: Nurse Practitioner

## 2020-10-06 DIAGNOSIS — Z1231 Encounter for screening mammogram for malignant neoplasm of breast: Secondary | ICD-10-CM

## 2020-10-30 ENCOUNTER — Other Ambulatory Visit: Payer: Self-pay | Admitting: Nurse Practitioner

## 2020-10-30 DIAGNOSIS — E1142 Type 2 diabetes mellitus with diabetic polyneuropathy: Secondary | ICD-10-CM

## 2020-10-30 DIAGNOSIS — F3341 Major depressive disorder, recurrent, in partial remission: Secondary | ICD-10-CM

## 2020-11-05 ENCOUNTER — Other Ambulatory Visit: Payer: Self-pay | Admitting: Nurse Practitioner

## 2020-11-05 DIAGNOSIS — G43711 Chronic migraine without aura, intractable, with status migrainosus: Secondary | ICD-10-CM

## 2020-11-11 ENCOUNTER — Ambulatory Visit: Payer: Self-pay | Admitting: Nurse Practitioner

## 2020-11-22 ENCOUNTER — Ambulatory Visit (INDEPENDENT_AMBULATORY_CARE_PROVIDER_SITE_OTHER): Payer: 59 | Admitting: Nurse Practitioner

## 2020-11-22 ENCOUNTER — Other Ambulatory Visit: Payer: Self-pay

## 2020-11-22 ENCOUNTER — Encounter: Payer: Self-pay | Admitting: Nurse Practitioner

## 2020-11-22 VITALS — BP 143/74 | HR 77 | Temp 97.6°F | Resp 20 | Ht 64.0 in | Wt 210.0 lb

## 2020-11-22 DIAGNOSIS — G43711 Chronic migraine without aura, intractable, with status migrainosus: Secondary | ICD-10-CM

## 2020-11-22 DIAGNOSIS — E039 Hypothyroidism, unspecified: Secondary | ICD-10-CM

## 2020-11-22 DIAGNOSIS — E1142 Type 2 diabetes mellitus with diabetic polyneuropathy: Secondary | ICD-10-CM | POA: Diagnosis not present

## 2020-11-22 DIAGNOSIS — E782 Mixed hyperlipidemia: Secondary | ICD-10-CM

## 2020-11-22 DIAGNOSIS — I10 Essential (primary) hypertension: Secondary | ICD-10-CM

## 2020-11-22 DIAGNOSIS — F411 Generalized anxiety disorder: Secondary | ICD-10-CM

## 2020-11-22 DIAGNOSIS — F3341 Major depressive disorder, recurrent, in partial remission: Secondary | ICD-10-CM

## 2020-11-22 DIAGNOSIS — G250 Essential tremor: Secondary | ICD-10-CM

## 2020-11-22 DIAGNOSIS — K5790 Diverticulosis of intestine, part unspecified, without perforation or abscess without bleeding: Secondary | ICD-10-CM

## 2020-11-22 LAB — BAYER DCA HB A1C WAIVED: HB A1C (BAYER DCA - WAIVED): 7 % — ABNORMAL HIGH (ref <7.0)

## 2020-11-22 MED ORDER — EPINEPHRINE 0.3 MG/0.3ML IJ SOAJ: 0.3000 mg | INTRAMUSCULAR | 2 refills | Status: AC | PRN

## 2020-11-22 MED ORDER — GLIPIZIDE ER 10 MG PO TB24
10.0000 mg | ORAL_TABLET | Freq: Every day | ORAL | 1 refills | Status: DC
Start: 2020-11-22 — End: 2021-03-06

## 2020-11-22 MED ORDER — CITALOPRAM HYDROBROMIDE 40 MG PO TABS: 40.0000 mg | ORAL_TABLET | Freq: Every day | ORAL | 1 refills | Status: DC

## 2020-11-22 MED ORDER — PROPRANOLOL HCL 40 MG PO TABS: 40.0000 mg | ORAL_TABLET | Freq: Two times a day (BID) | ORAL | 1 refills | Status: DC

## 2020-11-22 MED ORDER — GABAPENTIN 100 MG PO CAPS: 100.0000 mg | ORAL_CAPSULE | Freq: Every day | ORAL | 1 refills | Status: DC

## 2020-11-22 MED ORDER — ATORVASTATIN CALCIUM 40 MG PO TABS
40.0000 mg | ORAL_TABLET | Freq: Every day | ORAL | 4 refills | Status: DC
Start: 2020-11-22 — End: 2021-03-06

## 2020-11-22 MED ORDER — TOPIRAMATE 50 MG PO TABS: 50.0000 mg | ORAL_TABLET | Freq: Two times a day (BID) | ORAL | 2 refills | Status: DC

## 2020-11-22 MED ORDER — BENAZEPRIL HCL 20 MG PO TABS: 20.0000 mg | ORAL_TABLET | Freq: Every day | ORAL | 1 refills | Status: DC

## 2020-11-22 MED ORDER — SITAGLIPTIN-METFORMIN HCL 50-1000 MG PO TABS
1.0000 | ORAL_TABLET | Freq: Two times a day (BID) | ORAL | 1 refills | Status: DC
Start: 2020-11-22 — End: 2021-06-30

## 2020-11-22 MED ORDER — FENOFIBRATE 160 MG PO TABS: 160.0000 mg | ORAL_TABLET | Freq: Every day | ORAL | 4 refills | Status: DC

## 2020-11-22 MED ORDER — BUPROPION HCL ER (XL) 150 MG PO TB24: 450.0000 mg | ORAL_TABLET | Freq: Every day | ORAL | 2 refills | Status: DC

## 2020-11-22 NOTE — Addendum Note (Signed)
Addended by: Chevis Pretty on: 11/22/2020 04:29 PM   Modules accepted: Orders

## 2020-11-22 NOTE — Progress Notes (Signed)
Subjective:    Patient ID: [name], female    DOB: June 25, 1960, 60 y.o.   MRN: [id number]   Chief Complaint: Medical Management of Chronic Issues    HPI:  1. Primary hypertension No c/o chest pain, sob or headache. Does not check blood pressure at home. BP Readings from Last 3 Encounters:  11/22/20 (!) 143/74  08/04/20 106/61  04/06/20 118/66     2. Mixed hyperlipidemia Does not watch diet and does little to no exercise. Is on lipitor. Lab Results  Component Value Date   CHOL 322 (H) 08/04/2020   HDL 49 08/04/2020   LDLCALC 217 (H) 08/04/2020   LDLDIRECT 211 (H) 04/28/2019   TRIG 276 (H) 08/04/2020   CHOLHDL 6.6 (H) 08/04/2020     3. Type 2 diabetes mellitus with diabetic polyneuropathy, without long-term current use of insulin (HCC) She has not been checking her blood sugars. She is living with her niece and she does not know were her machine is. Lab Results  Component Value Date   HGBA1C 7.0 (H) 08/04/2020     4. Acquired hypothyroidism No problems that she is aware of Lab Results  Component Value Date   TSH 3.530 01/15/2019     5. Diverticulosis No recent flare ups  6. Anxiety state Stays stressed. GAD 7 : Generalized Anxiety Score 11/22/2020 08/04/2020 04/28/2019  Nervous, Anxious, on Edge 0 1 1  Control/stop worrying 0 1 1  Worry too much - different things _0 Trouble relaxing 0 1 1  Restless 0 0 0  Easily annoyed or irritable 0 0 0  Afraid - awful might happen 0 1 1  Total GAD 7 Score _1 Anxiety Difficulty Not difficult at all Not difficult at all -      7. Recurrent major depressive disorder, in partial remission ([place]) Stays anxious. Her husband died last year, since then she has lost her house, was living in her car for awhile. Now lives with her niece. Depression screen [facility] 2/9 11/22/2020 08/04/2020 04/06/2020  Decreased Interest 0 0 0  Down, Depressed, Hopeless 0 0 0  PHQ - 2 Score 0 0 0  Altered sleeping 2 2 -   Tired, decreased energy 0 2 -  Change in appetite 0 1 -  Feeling bad or failure about yourself  0 1 -  Trouble concentrating 0 0 -  Moving slowly or fidgety/restless 0 0 -  Suicidal thoughts 0 0 -  PHQ-9 Score 2 6 -  Difficult doing work/chores Somewhat difficult Not difficult at all -  Some recent data might be hidden     8. Morbid obesity ([place]) Weight is down 6 lbs Wt Readings from Last 3 Encounters:  11/22/20 210 lb (95.3 kg)  08/04/20 216 lb (98 kg)  12/23/19 227 lb (103 kg)   BMI Readings from Last 3 Encounters:  11/22/20 36.05 kg/m  08/04/20 37.08 kg/m  12/23/19 38.96 kg/m   9. Essential tremor Use to be on propranolol. Has been out and hands have really been shaking   Outpatient Encounter Medications as of 11/22/2020  Medication Sig   albuterol (VENTOLIN HFA) 108 (90 Base) MCG/ACT inhaler Inhale 2 puffs into the lungs every 4 (four) hours as needed for wheezing or shortness of breath. (NEEDS TO BE SEEN)   atorvastatin (LIPITOR) 40 MG tablet Take 1 tablet (40 mg total) by mouth daily.   benazepril (LOTENSIN) 20 MG tablet TAKE ONE TABLET BY MOUTH DAILY  buPROPion (WELLBUTRIN XL) 150 MG 24 hr tablet Take 3 tablets (450 mg total) by mouth daily.   citalopram (CELEXA) 40 MG tablet TAKE ONE TABLET BY MOUTH DAILY   EPINEPHrine 0.3 mg/0.3 mL IJ SOAJ injection INJECT 0.3 ML (0.3 MG TOTAL) INTO THE SHOULDER, THIGH, OR BUTTOCKS ONCE FOR 1 DOSE.   fenofibrate 160 MG tablet Take 1 tablet (160 mg total) by mouth daily.   glipiZIDE (GLUCOTROL XL) 10 MG 24 hr tablet TAKE ONE TABLET BY MOUTH DAILY   meclizine (ANTIVERT) 25 MG tablet Take 1 tablet (25 mg total) by mouth 2 (two) times daily as needed for dizziness.   ondansetron (ZOFRAN) 4 MG tablet Take 1 tablet (4 mg total) by mouth every 8 (eight) hours as needed for nausea or vomiting.   sitaGLIPtin-metformin (JANUMET) 50-1000 MG tablet Take 1 tablet by mouth 2 (two) times daily with breakfast and lunch.   topiramate (TOPAMAX)  50 MG tablet Take 1 tablet (50 mg total) by mouth 2 (two) times daily.   [DISCONTINUED] sulfamethoxazole-trimethoprim (BACTRIM DS) 800-160 MG tablet Take 1 tablet by mouth 2 (two) times daily.   [DISCONTINUED] SUMAtriptan (IMITREX) 50 MG tablet Take 1 tablet (50 mg total) by mouth every 2 (two) hours as needed for migraine. May repeat in 2 hours if headache persists or recurs.   No facility-administered encounter medications on file as of 11/22/2020.    Past Surgical History:  Procedure Laterality Date   ABDOMINAL HYSTERECTOMY  02/2007   + lysis of adhesions   APPENDECTOMY  1990s   ruptured, late 90s   BILATERAL OOPHORECTOMY      in 2 surgeries prior to 9/08   COLONOSCOPY  02/2010    normal upper endoscopy, single colonic polyp,   VENTRAL HERNIA REPAIR     x3    History reviewed. No pertinent family history.  New complaints: None today  Social history: Lives with her niece and her 2 teenagers  Controlled substance contract: n/a     Review of Systems  Constitutional:  Negative for diaphoresis.  Eyes:  Negative for pain.  Respiratory:  Negative for shortness of breath.   Cardiovascular:  Negative for chest pain, palpitations and leg swelling.  Gastrointestinal:  Negative for abdominal pain.  Endocrine: Negative for polydipsia.  Skin:  Negative for rash.  Neurological:  Negative for dizziness, weakness and headaches.  Hematological:  Does not bruise/bleed easily.  All other systems reviewed and are negative.     Objective:   Physical Exam Vitals and nursing note reviewed.  Constitutional:      General: She is not in acute distress.    Appearance: Normal appearance. She is well-developed.  HENT:     Head: Normocephalic.     Right Ear: Tympanic membrane normal.     Left Ear: Tympanic membrane normal.     Nose: Nose normal.     Mouth/Throat:     Mouth: Mucous membranes are moist.  Eyes:     Pupils: Pupils are equal, round, and reactive to light.  Neck:      Vascular: No carotid bruit or JVD.  Cardiovascular:     Rate and Rhythm: Normal rate and regular rhythm.     Heart sounds: Normal heart sounds.  Pulmonary:     Effort: Pulmonary effort is normal. No respiratory distress.     Breath sounds: Normal breath sounds. No wheezing or rales.  Chest:     Chest wall: No tenderness.  Abdominal:     General: Bowel sounds  are normal. There is no distension or abdominal bruit.     Palpations: Abdomen is soft. There is no hepatomegaly, splenomegaly, mass or pulsatile mass.     Tenderness: There is no abdominal tenderness.  Musculoskeletal:        General: Normal range of motion.     Cervical back: Normal range of motion and neck supple.  Lymphadenopathy:     Cervical: No cervical adenopathy.  Skin:    General: Skin is warm and dry.  Neurological:     Mental Status: She is alert and oriented to person, place, and time.     Deep Tendon Reflexes: Reflexes are normal and symmetric.  Psychiatric:        Behavior: Behavior normal.        Thought Content: Thought content normal.        Judgment: Judgment normal.   BP (!) 143/74   Pulse 77   Temp 97.6 F (36.4 C) (Temporal)   Resp 20   Ht _0  (1.626 m)   Wt 210 lb (95.3 kg)   SpO2 97%   BMI 36.05 kg/m   HGBA1c 7.0%       Assessment & Plan:   NERY KALISZ comes in today with chief complaint of Medical Management of Chronic Issues   Diagnosis and orders addressed:  1. Primary hypertension Low sodium diet - CBC with Differential/Platelet - CMP14+EGFR - benazepril (LOTENSIN) 20 MG tablet; Take 1 tablet (20 mg total) by mouth daily.  Dispense: 90 tablet; Refill: 1 - CBC with Differential/Platelet - CMP14+EGFR  2. Mixed hyperlipidemia Low fat diet - Lipid panel - atorvastatin (LIPITOR) 40 MG tablet; Take 1 tablet (40 mg total) by mouth daily.  Dispense: 30 tablet; Refill: 4 - fenofibrate 160 MG tablet; Take 1 tablet (160 mg total) by mouth daily.  Dispense: 30 tablet;  Refill: 4 - Lipid panel  3. Type 2 diabetes mellitus with diabetic polyneuropathy, without long-term current use of insulin (HCC) Contniue to watch carbs in h=diet - Bayer DCA Hb A1c Waived - glipiZIDE (GLUCOTROL XL) 10 MG 24 hr tablet; Take 1 tablet (10 mg total) by mouth daily.  Dispense: 180 tablet; Refill: 1 - sitaGLIPtin-metformin (JANUMET) 50-1000 MG tablet; Take 1 tablet by mouth 2 (two) times daily with breakfast and lunch.  Dispense: 90 tablet; Refill: 1  4. Acquired hypothyroidism Lbas pending - Thyroid Panel With TSH  5. Diverticulosis Watch diet  to prevent flare up  6. Anxiety state Stress management - buPROPion (WELLBUTRIN XL) 150 MG 24 hr tablet; Take 3 tablets (450 mg total) by mouth daily.  Dispense: 90 tablet; Refill: 2  7. Recurrent major depressive disorder, in partial remission (HCC) - citalopram (CELEXA) 40 MG tablet; Take 1 tablet (40 mg total) by mouth daily.  Dispense: 180 tablet; Refill: 1  8. Morbid obesity ([place]) Discussed diet and exercise for person with BMI >25 Will recheck weight in 3-6 months  9. Intractable chronic migraine without aura and with status migrainosus Avoid caffeine - topiramate (TOPAMAX) 50 MG tablet; Take 1 tablet (50 mg total) by mouth 2 (two) times daily.  Dispense: 60 tablet; Refill: 2  10. Essential tremor - propranolol (INDERAL) 40 MG tablet; Take 1 tablet (40 mg total) by mouth 2 (two) times daily. (Needs to be seen before next refill)  Dispense: 180 tablet; Refill: 1  11. Diabetic neuropathy Neurontin100 mg qhs  Labs pending Health Maintenance reviewed Diet and exercise encouraged  Follow up plan: 6 months   Mary-Margaret  Hassell Done, [place]

## 2020-11-22 NOTE — Patient Instructions (Signed)
Diabetic Neuropathy Diabetic neuropathy refers to nerve damage that is caused by diabetes. Over time, people with diabetes can develop nerve damage throughout the body. There are several types of diabetic neuropathy: Peripheral neuropathy. This is the most common type of diabetic neuropathy. It damages the nerves that carry signals between the spinal cord and other parts of the body (peripheral nerves). This usually affects nerves in the feet, legs, hands, and arms. Autonomic neuropathy. This type causes damage to nerves that control involuntary functions (autonomic nerves). Involuntary functions are functions of the body that you do not control. They include heartbeat, body temperature, blood pressure, urination, digestion, sweating, sexual function, or response to changes in blood glucose. Focal neuropathy. This type of nerve damage affects one area of the body, such as an arm, a leg, or the face. The injury may involve one nerve or a small group of nerves. Focal neuropathy can be painful and unpredictable. It occurs most often in older adults with diabetes. This often develops suddenly, but usually improves over time and does not cause long-term problems. Proximal neuropathy. This type of nerve damage affects the nerves of the thighs, hips, buttocks, or legs. It causes severe pain, weakness, and muscle death (atrophy), usually in the thigh muscles. It is more common among older men and people who have type 2 diabetes. The length of recovery time may vary. What are the causes? Peripheral, autonomic, and focal neuropathies are caused by diabetes that is not well controlled with treatment. The cause of proximal neuropathy is not known, but it may be caused by inflammation related to uncontrolled blood glucose levels. What are the signs or symptoms? Peripheral neuropathy Peripheral neuropathy develops slowly over time. When the nerves of the feet and legs no longer work, you may experience: Burning,  stabbing, or aching pain in the legs or feet. Pain or cramping in the legs or feet. Loss of feeling (numbness) and inability to feel pressure or pain in the feet. This can lead to: Thick calluses or sores on areas of constant pressure. Ulcers. Reduced ability to feel temperature changes. Foot deformities. Muscle weakness. Loss of balance or coordination. Autonomic neuropathy The symptoms of autonomic neuropathy vary depending on which nerves are affected. Symptoms may include: Problems with digestion, such as: Nausea or vomiting. Poor appetite. Bloating. Diarrhea or constipation. Trouble swallowing. Losing weight without trying to. Problems with the heart, blood, and lungs, such as: Dizziness, especially when standing up. Fainting. Shortness of breath. Irregular heartbeat. Bladder problems, such as: Trouble starting or stopping urination. Leaking urine. Trouble emptying the bladder. Urinary tract infections (UTIs). Problems with other body functions, such as: Sweat. You may sweat too much or too little. Temperature. You might get hot easily. Or, you might feel cold more than usual. Sexual function. Men may not be able to get or maintain an erection. Women may have vaginal dryness and difficulty with arousal. Focal neuropathy Symptoms affect only one area of the body. Common symptoms include: Numbness. Tingling. Burning pain. Prickling feeling. Very sensitive skin. Weakness. Inability to move (paralysis). Muscle twitching. Muscles getting smaller (wasting). Poor coordination. Double or blurred vision. Proximal neuropathy Sudden, severe pain in the hip, thigh, or buttocks. Pain may spread from the back into the legs (sciatica). Pain and numbness in the arms and legs. Tingling. Loss of bladder control or bowel control. Weakness and wasting of thigh muscles. Difficulty getting up from a seated position. Abdominal swelling. Unexplained weight loss. How is this  diagnosed? Diagnosis varies depending on the type   of neuropathy your health care provider suspects. Peripheral neuropathy Your health care provider will do a neurologic exam. This exam checks your reflexes, how you move, and what you can feel. You may have other tests, such as: Blood tests. Tests of the fluid that surrounds the spinal cord (lumbar puncture). CT scan. MRI. Checking the nerves that control muscles (electromyogram, or EMG). Checking how quickly signals pass through your nerves (nerve conduction study). Checking a small piece of a nerve using a microscope (biopsy). Autonomic neuropathy You may have tests, such as: Tests to measure your blood pressure and heart rate. You may be secured to an exam table that moves you from a lying position to an upright position (table tilt test). Breathing tests to check your lungs. Tests to check how food moves through the digestive system (gastric emptying tests). Blood, sweat, or urine tests. Ultrasound of your bladder. Spinal fluid tests. Focal neuropathy This condition may be diagnosed with: A neurologic exam. CT scan. MRI. EMG. Nerve conduction study. Proximal neuropathy There is no test to diagnose this type of neuropathy. You may have tests to rule out other possible causes of this type of neuropathy. Tests may include: X-rays of your spine and lumbar region. Lumbar puncture. MRI. How is this treated? The goal of treatment is to keep nerve damage from getting worse. Treatment may include: Following your diabetes management plan. This will help keep your blood glucose level and your A1C level within your target range. This is the most important treatment. Using prescription pain medicine. Follow these instructions at home: Diabetes management Follow your diabetes management plan as told by your health care provider. Check your blood glucose levels. Keep your blood glucose in your target range. Have your A1C level checked at  least two times a year, or as often as told. Take over the counter and prescription medicines only as told by your health care provider. This includes insulin and diabetes medicine.  Lifestyle  Do not use any products that contain nicotine or tobacco, such as cigarettes, e-cigarettes, and chewing tobacco. If you need help quitting, ask your health care provider. Be physically active every day. Include strength training and balance exercises. Follow a healthy meal plan. Work with your health care provider to manage your blood pressure. General instructions Ask your health care provider if the medicine prescribed to you requires you to avoid driving or using machinery. Check your skin and feet every day for cuts, bruises, redness, blisters, or sores. Keep all follow-up visits. This is important. Contact a health care provider if: You have burning, stabbing, or aching pain in your legs or feet. You are unable to feel pressure or pain in your feet. You develop problems with digestion, such as: Nausea. Vomiting. Bloating. Constipation. Diarrhea. Abdominal pain. You have difficulty with urination, such as: Inability to control when you urinate (incontinence). Inability to completely empty the bladder (retention). You feel as if your heart is racing (palpitations). You feel dizzy, weak, or faint when you stand up. Get help right away if: You cannot urinate. You have sudden weakness or loss of coordination. You have trouble speaking. You have pain or pressure in your chest. You have an irregular heartbeat. You have sudden inability to move a part of your body. These symptoms may represent a serious problem that is an emergency. Do not wait to see if the symptoms will go away. Get medical help right away. Call your local emergency services (911 in the [place] Do not drive yourself to   the hospital. Summary Diabetic neuropathy is nerve damage that is caused by diabetes. It can cause numbness  and pain in the arms, legs, digestive tract, heart, and other body systems. This condition is treated by keeping your blood glucose level and your A1C level within your target range. This can help prevent neuropathy from getting worse. Check your skin and feet every day for cuts, bruises, redness, blisters, or sores. Do not use any products that contain nicotine or tobacco, such as cigarettes, e-cigarettes, and chewing tobacco. This information is not intended to replace advice given to you by your health care provider. Make sure you discuss any questions you have with your health care provider. Document Revised: 10/08/2019 Document Reviewed: 10/08/2019 Elsevier Patient Education  2022 [employer]  

## 2020-11-23 LAB — CBC WITH DIFFERENTIAL/PLATELET
Basophils Absolute: 0 10*3/uL (ref 0.0–0.2)
Basos: 1 %
EOS (ABSOLUTE): 0.2 10*3/uL (ref 0.0–0.4)
Eos: 2 %
Hematocrit: 34.9 % (ref 34.0–46.6)
Hemoglobin: 11.5 g/dL (ref 11.1–15.9)
Immature Grans (Abs): 0 10*3/uL (ref 0.0–0.1)
Immature Granulocytes: 0 %
Lymphocytes Absolute: 2.4 10*3/uL (ref 0.7–3.1)
Lymphs: 37 %
MCH: 28.9 pg (ref 26.6–33.0)
MCHC: 33 g/dL (ref 31.5–35.7)
MCV: 88 fL (ref 79–97)
Monocytes Absolute: 0.4 10*3/uL (ref 0.1–0.9)
Monocytes: 7 %
Neutrophils Absolute: 3.5 10*3/uL (ref 1.4–7.0)
Neutrophils: 53 %
Platelets: 201 10*3/uL (ref 150–450)
RBC: 3.98 x10E6/uL (ref 3.77–5.28)
RDW: 12.7 % (ref 11.7–15.4)
WBC: 6.6 10*3/uL (ref 3.4–10.8)

## 2020-11-23 LAB — CMP14+EGFR
ALT: 27 IU/L (ref 0–32)
AST: 16 IU/L (ref 0–40)
Albumin/Globulin Ratio: 1.8 (ref 1.2–2.2)
Albumin: 4.4 g/dL (ref 3.8–4.9)
Alkaline Phosphatase: 67 IU/L (ref 44–121)
BUN/Creatinine Ratio: 18 (ref 12–28)
BUN: 21 mg/dL (ref 8–27)
Bilirubin Total: 0.2 mg/dL (ref 0.0–1.2)
CO2: 20 mmol/L (ref 20–29)
Calcium: 9.9 mg/dL (ref 8.7–10.3)
Chloride: 102 mmol/L (ref 96–106)
Creatinine, Ser: 1.16 mg/dL — ABNORMAL HIGH (ref 0.57–1.00)
Globulin, Total: 2.5 g/dL (ref 1.5–4.5)
Glucose: 155 mg/dL — ABNORMAL HIGH (ref 65–99)
Potassium: 4.4 mmol/L (ref 3.5–5.2)
Sodium: 138 mmol/L (ref 134–144)
Total Protein: 6.9 g/dL (ref 6.0–8.5)
eGFR: 54 mL/min/{1.73_m2} — ABNORMAL LOW (ref >59)

## 2020-11-23 LAB — LIPID PANEL
Chol/HDL Ratio: 5.6 ratio — ABNORMAL HIGH (ref 0.0–4.4)
Cholesterol, Total: 270 mg/dL — ABNORMAL HIGH (ref 100–199)
HDL: 48 mg/dL (ref >39)
LDL Chol Calc (NIH): 172 mg/dL — ABNORMAL HIGH (ref 0–99)
Triglycerides: 264 mg/dL — ABNORMAL HIGH (ref 0–149)
VLDL Cholesterol Cal: 50 mg/dL — ABNORMAL HIGH (ref 5–40)

## 2020-11-23 LAB — THYROID PANEL WITH TSH
Free Thyroxine Index: 1.4 (ref 1.2–4.9)
T3 Uptake Ratio: 23 % — ABNORMAL LOW (ref 24–39)
T4, Total: 6.1 ug/dL (ref 4.5–12.0)
TSH: 1.78 u[IU]/mL (ref 0.450–4.500)

## 2020-12-27 ENCOUNTER — Telehealth: Payer: Self-pay | Admitting: Nurse Practitioner

## 2020-12-27 DIAGNOSIS — M25549 Pain in joints of unspecified hand: Secondary | ICD-10-CM

## 2020-12-27 NOTE — Telephone Encounter (Signed)
Pt returned missed call stating "what makes MMM think it is her back that is causing her issues" and "in the meantime, while im waiting on referral, this pain medicine that im taking is not working and I have to work. What am I supposed to do?"  Please advise and call patient.

## 2020-12-27 NOTE — Telephone Encounter (Signed)
Cannot do anything stronger without being seen. Has to be seen in person for pan medication.

## 2020-12-27 NOTE — Telephone Encounter (Signed)
Pain may be coming from hr back. Needs referral to dr. Nelva Bush please

## 2020-12-27 NOTE — Telephone Encounter (Signed)
Please review and advise.

## 2020-12-27 NOTE — Telephone Encounter (Signed)
Left detailed message on patients voicemail that referral was being made and we would contact her with an appointment. Referral placed

## 2020-12-27 NOTE — Telephone Encounter (Signed)
Pt called stating that she has been taking her neuropathy medicine for almost a month and says its not working. Says she is unable to sleep at night, has pain in her arms, legs, and hands, and pt cant deal with it anymore.  Please advise and call patient.

## 2020-12-28 ENCOUNTER — Telehealth: Payer: Self-pay | Admitting: Nurse Practitioner

## 2020-12-28 NOTE — Telephone Encounter (Signed)
Patient aware no recent labs

## 2020-12-28 NOTE — Telephone Encounter (Signed)
Lmtcb   When patient calls back let her know what MMM said

## 2020-12-28 NOTE — Telephone Encounter (Signed)
Please  call pt to discuss labs

## 2020-12-28 NOTE — Telephone Encounter (Signed)
Lmtcb.

## 2020-12-29 ENCOUNTER — Ambulatory Visit: Payer: 59 | Admitting: Family Medicine

## 2020-12-30 ENCOUNTER — Encounter: Payer: Self-pay | Admitting: Nurse Practitioner

## 2020-12-30 ENCOUNTER — Ambulatory Visit (INDEPENDENT_AMBULATORY_CARE_PROVIDER_SITE_OTHER): Payer: 59 | Admitting: Nurse Practitioner

## 2020-12-30 ENCOUNTER — Other Ambulatory Visit: Payer: Self-pay

## 2020-12-30 VITALS — BP 104/66 | HR 48 | Temp 97.5°F | Resp 20 | Ht 64.0 in | Wt 208.0 lb

## 2020-12-30 DIAGNOSIS — E1140 Type 2 diabetes mellitus with diabetic neuropathy, unspecified: Secondary | ICD-10-CM

## 2020-12-30 MED ORDER — GABAPENTIN 300 MG PO CAPS: 300.0000 mg | ORAL_CAPSULE | Freq: Three times a day (TID) | ORAL | 3 refills | Status: DC

## 2020-12-30 NOTE — Patient Instructions (Signed)
Neuropathic Pain Neuropathic pain is pain caused by damage to the nerves that are responsible for certain sensations in your body (sensory nerves). The pain can be caused by: Damage to the sensory nerves that send signals to your spinal cord and brain (peripheral nervous system). Damage to the sensory nerves in your brain or spinal cord (central nervous system). Neuropathic pain can make you more sensitive to pain. Even a minor sensation can feel very painful. This is usually a long-term condition that can be difficult to treat. The type of pain differs from person to person. It may: Start suddenly (acute), or it may develop slowly and last for a long time (chronic). Come and go as damaged nerves heal, or it may stay at the same level for years. Cause emotional distress, loss of sleep, and a lower quality of life. What are the causes? The most common cause of this condition is diabetes. Many other diseases and conditions can also cause neuropathic pain. Causes of neuropathic pain can be classified as: Toxic. This is caused by medicines and chemicals. The most common cause of toxic neuropathic pain is damage from cancer treatments (chemotherapy). Metabolic. This can be caused by: Diabetes. This is the most common disease that damages the nerves. Lack of vitamin B from long-term alcohol abuse. Traumatic. Any injury that cuts, crushes, or stretches a nerve can cause damage and pain. A common example is feeling pain after losing an arm or leg (phantom limb pain). Compression-related. If a sensory nerve gets trapped or compressed for a long period of time, the blood supply to the nerve can be cut off. Vascular. Many blood vessel diseases can cause neuropathic pain by decreasing blood supply and oxygen to nerves. Autoimmune. This type of pain results from diseases in which the body's defense system (immune system) mistakenly attacks sensory nerves. Examples of autoimmune diseases that can cause  neuropathic pain include lupus and multiple sclerosis. Infectious. Many types of viral infections can damage sensory nerves and cause pain. Shingles infection is a common cause of this type of pain. Inherited. Neuropathic pain can be a symptom of many diseases that are passed down through families (genetic). What increases the risk? You are more likely to develop this condition if: You have diabetes. You smoke. You drink too much alcohol. You are taking certain medicines, including medicines that kill cancer cells (chemotherapy) or that treat immune system disorders. What are the signs or symptoms? The main symptom is pain. Neuropathic pain is often described as: Burning. Shock-like. Stinging. Hot or cold. Itching. How is this diagnosed? No single test can diagnose neuropathic pain. It is diagnosed based on: Physical exam and your symptoms. Your health care provider will ask you about your pain. You may be asked to use a pain scale to describe how bad your pain is. Tests. These may be done to see if you have a high sensitivity to pain and to help find the cause and location of any sensory nerve damage. They include: Nerve conduction studies to test how well nerve signals travel through your sensory nerves (electrodiagnostic testing). Stimulating your sensory nerves through electrodes on your skin and measuring the response in your spinal cord and brain (somatosensory evoked potential). Imaging studies, such as: X-rays. CT scan. MRI. How is this treated? Treatment for neuropathic pain may change over time. You may need to try different treatment options or a combination of treatments. Some options include: Treating the underlying cause of the neuropathy, such as diabetes, kidney disease, or vitamin   deficiencies. Stopping medicines that can cause neuropathy, such as chemotherapy. Medicine to relieve pain. Medicines may include: Prescription or over-the-counter pain  medicine. Anti-seizure medicine. Antidepressant medicines. Pain-relieving patches that are applied to painful areas of skin. A medicine to numb the area (local anesthetic), which can be injected as a nerve block. Transcutaneous nerve stimulation. This uses electrical currents to block painful nerve signals. The treatment is painless. Alternative treatments, such as: Acupuncture. Meditation. Massage. Physical therapy. Pain management programs. Counseling. Follow these instructions at home: Medicines  Take over-the-counter and prescription medicines only as told by your health care provider. Do not drive or use heavy machinery while taking prescription pain medicine. If you are taking prescription pain medicine, take actions to prevent or treat constipation. Your health care provider may recommend that you: Drink enough fluid to keep your urine pale yellow. Eat foods that are high in fiber, such as fresh fruits and vegetables, whole grains, and beans. Limit foods that are high in fat and processed sugars, such as fried or sweet foods. Take an over-the-counter or prescription medicine for constipation. Lifestyle  Have a good support system at home. Consider joining a chronic pain support group. Do not use any products that contain nicotine or tobacco, such as cigarettes and e-cigarettes. If you need help quitting, ask your health care provider. Do not drink alcohol. General instructions Learn as much as you can about your condition. Work closely with all your health care providers to find the treatment plan that works best for you. Ask your health care provider what activities are safe for you. Keep all follow-up visits as told by your health care provider. This is important. Contact a health care provider if: Your pain treatments are not working. You are having side effects from your medicines. You are struggling with tiredness (fatigue), mood changes, depression, or  anxiety. Summary Neuropathic pain is pain caused by damage to the nerves that are responsible for certain sensations in your body (sensory nerves). Neuropathic pain may come and go as damaged nerves heal, or it may stay at the same level for years. Neuropathic pain is usually a long-term condition that can be difficult to treat. Consider joining a chronic pain support group. This information is not intended to replace advice given to you by your health care provider. Make sure you discuss any questions you have with your health care provider. Document Revised: 09/18/2018 Document Reviewed: 06/14/2017 Elsevier Patient Education  2022 [employer] Inc.  

## 2020-12-30 NOTE — Progress Notes (Signed)
   Subjective:    Patient ID: [name], female    DOB: December 13, 1960, 60 y.o.   MRN: [id number]  Chief Complaint: hands painful and going numb and Bilateral leg pain   HPI Patient comes in today c/o hands going numb and leg pain. We started her on neurontin '100mg'$  at night. Was not helping so she increased it to 2 tablets at night and that helped a little. She says that her hands are really numb at work . She has to grip a sander and with hands being numb that is difficult. Work wants her to stay but she needs to get under control.     Review of Systems  Constitutional:  Negative for diaphoresis.  Eyes:  Negative for pain.  Respiratory:  Negative for shortness of breath.   Cardiovascular:  Negative for chest pain, palpitations and leg swelling.  Gastrointestinal:  Negative for abdominal pain.  Endocrine: Negative for polydipsia.  Skin:  Negative for rash.  Neurological:  Negative for dizziness, weakness and headaches.  Hematological:  Does not bruise/bleed easily.  All other systems reviewed and are negative.     Objective:   Physical Exam Vitals and nursing note reviewed.  Constitutional:      Appearance: Normal appearance.  Cardiovascular:     Rate and Rhythm: Normal rate and regular rhythm.     Heart sounds: Normal heart sounds.  Pulmonary:     Effort: Pulmonary effort is normal.     Breath sounds: Normal breath sounds.  Musculoskeletal:     Comments: grips equal bil Positive monofilament bil fingers  Neurological:     General: No focal deficit present.     Mental Status: She is oriented to person, place, and time.  Psychiatric:        Mood and Affect: Mood normal.        Behavior: Behavior normal.  .  BP 104/66   Pulse (!) 48   Temp (!) 97.5 F (36.4 C) (Temporal)   Resp 20   Ht '5\' 4"'$  (1.626 m)   Wt 208 lb (94.3 kg)   SpO2 98%   BMI 35.70 kg/m        Assessment & Plan:   [name] in today with chief complaint of hands painful and  going numb and Bilateral leg pain   1. Type 2 diabetes mellitus with diabetic neuropathy, without long-term current use of insulin ([place]) Patient will let me know if neurontin does not help - gabapentin (NEURONTIN) 300 MG capsule; Take 1 capsule (300 mg total) by mouth 3 (three) times daily.  Dispense: 90 capsule; Refill: 3    The above assessment and management plan was discussed with the patient. The patient verbalized understanding of and has agreed to the management plan. Patient is aware to call the clinic if symptoms persist or worsen. Patient is aware when to return to the clinic for a follow-up visit. Patient educated on when it is appropriate to go to the emergency department.   Mary-Margaret Hassell Done, FNP

## 2021-01-02 NOTE — Telephone Encounter (Signed)
Patient seen in office for visit

## 2021-03-06 ENCOUNTER — Ambulatory Visit (INDEPENDENT_AMBULATORY_CARE_PROVIDER_SITE_OTHER): Payer: Self-pay | Admitting: Nurse Practitioner

## 2021-03-06 ENCOUNTER — Other Ambulatory Visit: Payer: Self-pay

## 2021-03-06 ENCOUNTER — Encounter: Payer: Self-pay | Admitting: Nurse Practitioner

## 2021-03-06 VITALS — BP 119/66 | HR 63 | Temp 97.6°F | Wt 201.2 lb

## 2021-03-06 DIAGNOSIS — F3341 Major depressive disorder, recurrent, in partial remission: Secondary | ICD-10-CM

## 2021-03-06 DIAGNOSIS — E1140 Type 2 diabetes mellitus with diabetic neuropathy, unspecified: Secondary | ICD-10-CM

## 2021-03-06 DIAGNOSIS — F411 Generalized anxiety disorder: Secondary | ICD-10-CM

## 2021-03-06 DIAGNOSIS — G43109 Migraine with aura, not intractable, without status migrainosus: Secondary | ICD-10-CM

## 2021-03-06 DIAGNOSIS — E782 Mixed hyperlipidemia: Secondary | ICD-10-CM

## 2021-03-06 DIAGNOSIS — E1142 Type 2 diabetes mellitus with diabetic polyneuropathy: Secondary | ICD-10-CM

## 2021-03-06 DIAGNOSIS — G250 Essential tremor: Secondary | ICD-10-CM

## 2021-03-06 DIAGNOSIS — G43711 Chronic migraine without aura, intractable, with status migrainosus: Secondary | ICD-10-CM

## 2021-03-06 MED ORDER — BUPROPION HCL ER (XL) 150 MG PO TB24: 450.0000 mg | ORAL_TABLET | Freq: Every day | ORAL | 2 refills | Status: DC

## 2021-03-06 MED ORDER — PROPRANOLOL HCL 40 MG PO TABS: 40.0000 mg | ORAL_TABLET | Freq: Two times a day (BID) | ORAL | 1 refills | Status: DC

## 2021-03-06 MED ORDER — FENOFIBRATE 160 MG PO TABS: 160.0000 mg | ORAL_TABLET | Freq: Every day | ORAL | 4 refills | Status: DC

## 2021-03-06 MED ORDER — ATORVASTATIN CALCIUM 40 MG PO TABS
40.0000 mg | ORAL_TABLET | Freq: Every day | ORAL | 4 refills | Status: DC
Start: 2021-03-06 — End: 2021-06-30

## 2021-03-06 MED ORDER — GLIPIZIDE ER 10 MG PO TB24: 10.0000 mg | ORAL_TABLET | Freq: Every day | ORAL | 1 refills | Status: DC

## 2021-03-06 MED ORDER — TOPIRAMATE 50 MG PO TABS: 50.0000 mg | ORAL_TABLET | Freq: Two times a day (BID) | ORAL | 2 refills | Status: DC

## 2021-03-06 MED ORDER — KETOROLAC TROMETHAMINE 60 MG/2ML IM SOLN
60.0000 mg | Freq: Once | INTRAMUSCULAR | Status: AC
  Administered 2021-03-06: 60 mg via INTRAMUSCULAR

## 2021-03-06 MED ORDER — CITALOPRAM HYDROBROMIDE 40 MG PO TABS: 40.0000 mg | ORAL_TABLET | Freq: Every day | ORAL | 1 refills | Status: DC

## 2021-03-06 MED ORDER — GABAPENTIN 300 MG PO CAPS: 300.0000 mg | ORAL_CAPSULE | Freq: Three times a day (TID) | ORAL | 3 refills | Status: DC

## 2021-03-06 NOTE — Progress Notes (Signed)
Subjective:    Patient ID: [name], female    DOB: 13-Feb-1961, 60 y.o.   MRN: [id number]  Chief Complaint: Migraine   HPI Patient comes in today c/o migraine. She has a history of migraines and she takes topamax daily. This mirgaine started 4 days ago. Rates 10/10. Nothing seems to help it . She has had to miss work because of migraine.     Review of Systems  Constitutional:  Negative for diaphoresis.  Eyes:  Negative for photophobia, pain and visual disturbance.  Respiratory:  Negative for shortness of breath.   Cardiovascular:  Negative for chest pain, palpitations and leg swelling.  Gastrointestinal:  Positive for nausea (slight). Negative for abdominal pain.  Endocrine: Negative for polydipsia.  Skin:  Negative for rash.  Neurological:  Negative for dizziness, weakness and headaches.  Hematological:  Does not bruise/bleed easily.  All other systems reviewed and are negative.     Objective:   Physical Exam Vitals and nursing note reviewed.  Constitutional:      General: She is not in acute distress.    Appearance: Normal appearance. She is well-developed.  HENT:     Head: Normocephalic.     Right Ear: Tympanic membrane normal.     Left Ear: Tympanic membrane normal.     Nose: Nose normal.     Mouth/Throat:     Mouth: Mucous membranes are moist.  Eyes:     Pupils: Pupils are equal, round, and reactive to light.  Neck:     Vascular: No carotid bruit or JVD.  Cardiovascular:     Rate and Rhythm: Normal rate and regular rhythm.     Heart sounds: Normal heart sounds.  Pulmonary:     Effort: Pulmonary effort is normal. No respiratory distress.     Breath sounds: Normal breath sounds. No wheezing or rales.  Chest:     Chest wall: No tenderness.  Abdominal:     General: Bowel sounds are normal. There is no distension or abdominal bruit.     Palpations: Abdomen is soft. There is no hepatomegaly, splenomegaly, mass or pulsatile mass.     Tenderness: There  is no abdominal tenderness.  Musculoskeletal:        General: Normal range of motion.     Cervical back: Normal range of motion and neck supple.  Lymphadenopathy:     Cervical: No cervical adenopathy.  Skin:    General: Skin is warm and dry.  Neurological:     Mental Status: She is alert and oriented to person, place, and time.     Deep Tendon Reflexes: Reflexes are normal and symmetric.  Psychiatric:        Behavior: Behavior normal.        Thought Content: Thought content normal.        Judgment: Judgment normal.    BP 119/66   Pulse 63   Temp 97.6 F (36.4 C) (Temporal)   Wt 201 lb 3.2 oz (91.3 kg)   SpO2 100%   BMI 34.54 kg/m        Assessment & Plan:  [name] in today with chief complaint of Migraine   1. Migraine with aura and without status migrainosus, not intractable rest - ketorolac (TORADOL) injection 60 mg   Meds refilled due to relocation of pharmacy 2. Anxiety state - buPROPion (WELLBUTRIN XL) 150 MG 24 hr tablet; Take 3 tablets (450 mg total) by mouth daily.  Dispense: 90 tablet; Refill: 2  3.  Essential tremor - propranolol (INDERAL) 40 MG tablet; Take 1 tablet (40 mg total) by mouth 2 (two) times daily. (Needs to be seen before next refill)  Dispense: 180 tablet; Refill: 1  4. Intractable chronic migraine without aura and with status migrainosus -topiramate (TOPAMAX) 50 MG tablet; Take 1 tablet (50 mg total) by mouth 2 (two) times daily.  Dispense: 60 tablet; Refill: 2  5. Mixed hyperlipidemia - atorvastatin (LIPITOR) 40 MG tablet; Take 1 tablet (40 mg total) by mouth daily.  Dispense: 30 tablet; Refill: 4 - fenofibrate 160 MG tablet; Take 1 tablet (160 mg total) by mouth daily.  Dispense: 30 tablet; Refill: 4  6. Recurrent major depressive disorder, in partial remission (HCC) - citalopram (CELEXA) 40 MG tablet; Take 1 tablet (40 mg total) by mouth daily.  Dispense: 180 tablet; Refill: 1  7. Type 2 diabetes mellitus with diabetic  neuropathy, without long-term current use of insulin (HCC) - gabapentin (NEURONTIN) 300 MG capsule; Take 1 capsule (300 mg total) by mouth 3 (three) times daily.  Dispense: 90 capsule; Refill: 3 - glipiZIDE (GLUCOTROL XL) 10 MG 24 hr tablet; Take 1 tablet (10 mg total) by mouth daily.  Dispense: 180 tablet; Refill: 1    The above assessment and management plan was discussed with the patient. The patient verbalized understanding of and has agreed to the management plan. Patient is aware to call the clinic if symptoms persist or worsen. Patient is aware when to return to the clinic for a follow-up visit. Patient educated on when it is appropriate to go to the emergency department.   Mary-Margaret Hassell Done, FNP

## 2021-03-06 NOTE — Patient Instructions (Signed)
Migraine Headache A migraine headache is an intense, throbbing pain on one side or both sides of the head. Migraine headaches may also cause other symptoms, such as nausea, vomiting, and sensitivity to light and noise. A migraine headache can last from 4 hours to 3 days. Talk with your doctor about what things may bring on (trigger) your migraine headaches. What are the causes? The exact cause of this condition is not known. However, a migraine may be caused when nerves in the brain become irritated and release chemicals that cause inflammation of blood vessels. This inflammation causes pain. This condition may be triggered or caused by: Drinking alcohol. Smoking. Taking medicines, such as: Medicine used to treat chest pain (nitroglycerin). Birth control pills. Estrogen. Certain blood pressure medicines. Eating or drinking products that contain nitrates, glutamate, aspartame, or tyramine. Aged cheeses, chocolate, or caffeine may also be triggers. Doing physical activity. Other things that may trigger a migraine headache include: Menstruation. Pregnancy. Hunger. Stress. Lack of sleep or too much sleep. Weather changes. Fatigue. What increases the risk? The following factors may make you more likely to experience migraine headaches: Being a certain age. This condition is more common in people who are 25-55 years old. Being female. Having a family history of migraine headaches. Being Caucasian. Having a mental health condition, such as depression or anxiety. Being obese. What are the signs or symptoms? The main symptom of this condition is pulsating or throbbing pain. This pain may: Happen in any area of the head, such as on one side or both sides. Interfere with daily activities. Get worse with physical activity. Get worse with exposure to bright lights or loud noises. Other symptoms may include: Nausea. Vomiting. Dizziness. General sensitivity to bright lights, loud noises, or  smells. Before you get a migraine headache, you may get warning signs (an aura). An aura may include: Seeing flashing lights or having blind spots. Seeing bright spots, halos, or zigzag lines. Having tunnel vision or blurred vision. Having numbness or a tingling feeling. Having trouble talking. Having muscle weakness. Some people have symptoms after a migraine headache (postdromal phase), such as: Feeling tired. Difficulty concentrating. How is this diagnosed? A migraine headache can be diagnosed based on: Your symptoms. A physical exam. Tests, such as: CT scan or an MRI of the head. These imaging tests can help rule out other causes of headaches. Taking fluid from the spine (lumbar puncture) and analyzing it (cerebrospinal fluid analysis, or CSF analysis). How is this treated? This condition may be treated with medicines that: Relieve pain. Relieve nausea. Prevent migraine headaches. Treatment for this condition may also include: Acupuncture. Lifestyle changes like avoiding foods that trigger migraine headaches. Biofeedback. Cognitive behavioral therapy. Follow these instructions at home: Medicines Take over-the-counter and prescription medicines only as told by your health care provider. Ask your health care provider if the medicine prescribed to you: Requires you to avoid driving or using heavy machinery. Can cause constipation. You may need to take these actions to prevent or treat constipation: Drink enough fluid to keep your urine pale yellow. Take over-the-counter or prescription medicines. Eat foods that are high in fiber, such as beans, whole grains, and fresh fruits and vegetables. Limit foods that are high in fat and processed sugars, such as fried or sweet foods. Lifestyle Do not drink alcohol. Do not use any products that contain nicotine or tobacco, such as cigarettes, e-cigarettes, and chewing tobacco. If you need help quitting, ask your health care  provider. Get at least 8   hours of sleep every night. Find ways to manage stress, such as meditation, deep breathing, or yoga. General instructions   Keep a journal to find out what may trigger your migraine headaches. For example, write down: What you eat and drink. How much sleep you get. Any change to your diet or medicines. If you have a migraine headache: Avoid things that make your symptoms worse, such as bright lights. It may help to lie down in a dark, quiet room. Do not drive or use heavy machinery. Ask your health care provider what activities are safe for you while you are experiencing symptoms. Keep all follow-up visits as told by your health care provider. This is important. Contact a health care provider if: You develop symptoms that are different or more severe than your usual migraine headache symptoms. You have more than 15 headache days in one month. Get help right away if: Your migraine headache becomes severe. Your migraine headache lasts longer than 72 hours. You have a fever. You have a stiff neck. You have vision loss. Your muscles feel weak or like you cannot control them. You start to lose your balance often. You have trouble walking. You faint. You have a seizure. Summary A migraine headache is an intense, throbbing pain on one side or both sides of the head. Migraines may also cause other symptoms, such as nausea, vomiting, and sensitivity to light and noise. This condition may be treated with medicines and lifestyle changes. You may also need to avoid certain things that trigger a migraine headache. Keep a journal to find out what may trigger your migraine headaches. Contact your health care provider if you have more than 15 headache days in a month or you develop symptoms that are different or more severe than your usual migraine headache symptoms. This information is not intended to replace advice given to you by your health care provider. Make sure you  discuss any questions you have with your health care provider. Document Revised: 09/19/2018 Document Reviewed: 07/10/2018 Elsevier Patient Education  2022 [employer] Inc.  

## 2021-03-07 ENCOUNTER — Other Ambulatory Visit: Payer: Self-pay | Admitting: Nurse Practitioner

## 2021-03-07 DIAGNOSIS — E782 Mixed hyperlipidemia: Secondary | ICD-10-CM

## 2021-03-17 ENCOUNTER — Telehealth: Payer: Self-pay

## 2021-03-17 NOTE — Telephone Encounter (Signed)
Called patient to schedule mobile mammogram lmtcb

## 2021-05-19 ENCOUNTER — Encounter: Payer: Self-pay | Admitting: Nurse Practitioner

## 2021-05-19 ENCOUNTER — Ambulatory Visit (INDEPENDENT_AMBULATORY_CARE_PROVIDER_SITE_OTHER): Payer: Commercial Managed Care - PPO | Admitting: Nurse Practitioner

## 2021-05-19 VITALS — BP 97/58 | HR 50 | Temp 97.1°F | Resp 20 | Ht 64.0 in | Wt 206.0 lb

## 2021-05-19 DIAGNOSIS — G43109 Migraine with aura, not intractable, without status migrainosus: Secondary | ICD-10-CM | POA: Diagnosis not present

## 2021-05-19 DIAGNOSIS — Z23 Encounter for immunization: Secondary | ICD-10-CM | POA: Diagnosis not present

## 2021-05-19 MED ORDER — KETOROLAC TROMETHAMINE 60 MG/2ML IM SOLN
60.0000 mg | Freq: Once | INTRAMUSCULAR | Status: AC
  Administered 2021-05-19: 60 mg via INTRAMUSCULAR

## 2021-05-19 MED ORDER — RIZATRIPTAN BENZOATE 10 MG PO TABS: 10.0000 mg | ORAL_TABLET | ORAL | 2 refills | Status: DC | PRN

## 2021-05-19 NOTE — Patient Instructions (Signed)
Migraine Headache A migraine headache is an intense, throbbing pain on one side or both sides of the head. Migraine headaches may also cause other symptoms, such as nausea, vomiting, and sensitivity to light and noise. A migraine headache can last from 4 hours to 3 days. Talk with your doctor about what things may bring on (trigger) your migraine headaches. What are the causes? The exact cause of this condition is not known. However, a migraine may be caused when nerves in the brain become irritated and release chemicals that cause inflammation of blood vessels. This inflammation causes pain. This condition may be triggered or caused by: Drinking alcohol. Smoking. Taking medicines, such as: Medicine used to treat chest pain (nitroglycerin). Birth control pills. Estrogen. Certain blood pressure medicines. Eating or drinking products that contain nitrates, glutamate, aspartame, or tyramine. Aged cheeses, chocolate, or caffeine may also be triggers. Doing physical activity. Other things that may trigger a migraine headache include: Menstruation. Pregnancy. Hunger. Stress. Lack of sleep or too much sleep. Weather changes. Fatigue. What increases the risk? The following factors may make you more likely to experience migraine headaches: Being a certain age. This condition is more common in people who are 25-55 years old. Being female. Having a family history of migraine headaches. Being Caucasian. Having a mental health condition, such as depression or anxiety. Being obese. What are the signs or symptoms? The main symptom of this condition is pulsating or throbbing pain. This pain may: Happen in any area of the head, such as on one side or both sides. Interfere with daily activities. Get worse with physical activity. Get worse with exposure to bright lights or loud noises. Other symptoms may include: Nausea. Vomiting. Dizziness. General sensitivity to bright lights, loud noises, or  smells. Before you get a migraine headache, you may get warning signs (an aura). An aura may include: Seeing flashing lights or having blind spots. Seeing bright spots, halos, or zigzag lines. Having tunnel vision or blurred vision. Having numbness or a tingling feeling. Having trouble talking. Having muscle weakness. Some people have symptoms after a migraine headache (postdromal phase), such as: Feeling tired. Difficulty concentrating. How is this diagnosed? A migraine headache can be diagnosed based on: Your symptoms. A physical exam. Tests, such as: CT scan or an MRI of the head. These imaging tests can help rule out other causes of headaches. Taking fluid from the spine (lumbar puncture) and analyzing it (cerebrospinal fluid analysis, or CSF analysis). How is this treated? This condition may be treated with medicines that: Relieve pain. Relieve nausea. Prevent migraine headaches. Treatment for this condition may also include: Acupuncture. Lifestyle changes like avoiding foods that trigger migraine headaches. Biofeedback. Cognitive behavioral therapy. Follow these instructions at home: Medicines Take over-the-counter and prescription medicines only as told by your health care provider. Ask your health care provider if the medicine prescribed to you: Requires you to avoid driving or using heavy machinery. Can cause constipation. You may need to take these actions to prevent or treat constipation: Drink enough fluid to keep your urine pale yellow. Take over-the-counter or prescription medicines. Eat foods that are high in fiber, such as beans, whole grains, and fresh fruits and vegetables. Limit foods that are high in fat and processed sugars, such as fried or sweet foods. Lifestyle Do not drink alcohol. Do not use any products that contain nicotine or tobacco, such as cigarettes, e-cigarettes, and chewing tobacco. If you need help quitting, ask your health care  provider. Get at least 8   hours of sleep every night. Find ways to manage stress, such as meditation, deep breathing, or yoga. General instructions   Keep a journal to find out what may trigger your migraine headaches. For example, write down: What you eat and drink. How much sleep you get. Any change to your diet or medicines. If you have a migraine headache: Avoid things that make your symptoms worse, such as bright lights. It may help to lie down in a dark, quiet room. Do not drive or use heavy machinery. Ask your health care provider what activities are safe for you while you are experiencing symptoms. Keep all follow-up visits as told by your health care provider. This is important. Contact a health care provider if: You develop symptoms that are different or more severe than your usual migraine headache symptoms. You have more than 15 headache days in one month. Get help right away if: Your migraine headache becomes severe. Your migraine headache lasts longer than 72 hours. You have a fever. You have a stiff neck. You have vision loss. Your muscles feel weak or like you cannot control them. You start to lose your balance often. You have trouble walking. You faint. You have a seizure. Summary A migraine headache is an intense, throbbing pain on one side or both sides of the head. Migraines may also cause other symptoms, such as nausea, vomiting, and sensitivity to light and noise. This condition may be treated with medicines and lifestyle changes. You may also need to avoid certain things that trigger a migraine headache. Keep a journal to find out what may trigger your migraine headaches. Contact your health care provider if you have more than 15 headache days in a month or you develop symptoms that are different or more severe than your usual migraine headache symptoms. This information is not intended to replace advice given to you by your health care provider. Make sure you  discuss any questions you have with your health care provider. Document Revised: 09/19/2018 Document Reviewed: 07/10/2018 Elsevier Patient Education  2022 [employer] Inc.  

## 2021-05-19 NOTE — Progress Notes (Signed)
   Subjective:    Patient ID: [name], female    DOB: 1960-09-04, 60 y.o.   MRN: [id number]   Chief Complaint: migraine  HPI Patient comes in  today c/o migraine. She has a long history of migraines and is on inderal and topamax for prevention. This migraine started Wednesday. Has nausea and visual disturbances.     Review of Systems  Constitutional: Negative.   HENT: Negative.    Eyes:  Positive for photophobia. Negative for pain, discharge and visual disturbance.  Gastrointestinal:  Positive for nausea.  Neurological:  Positive for headaches. Negative for dizziness and facial asymmetry.  All other systems reviewed and are negative.     Objective:   Physical Exam Vitals and nursing note reviewed.  Constitutional:      Appearance: Normal appearance. She is obese.  Cardiovascular:     Rate and Rhythm: Normal rate and regular rhythm.     Heart sounds: Normal heart sounds.  Pulmonary:     Effort: Pulmonary effort is normal.     Breath sounds: Normal breath sounds.  Skin:    General: Skin is warm.  Neurological:     General: No focal deficit present.     Mental Status: She is alert and oriented to person, place, and time.     Cranial Nerves: No cranial nerve deficit.     Sensory: No sensory deficit.  Psychiatric:        Mood and Affect: Mood normal.   BP (!) 97/58   Pulse (!) 50   Temp (!) 97.1 F (36.2 C) (Temporal)   Resp 20   Ht 5\' 4"  (1.626 m)   Wt 206 lb (93.4 kg)   SpO2 98%   BMI 35.36 kg/m         Assessment & Plan:  [name] in today with chief complaint of Migraine   1. Migraine with aura and without status migrainosus, not intractable Rest Ice to head Maxalt as needed RTO prn - rizatriptan (MAXALT) 10 MG tablet; Take 1 tablet (10 mg total) by mouth as needed for migraine. May repeat in 2 hours if needed  Dispense: 10 tablet; Refill: 2 - ketorolac (TORADOL) injection 60 mg    The above assessment and management plan  was discussed with the patient. The patient verbalized understanding of and has agreed to the management plan. Patient is aware to call the clinic if symptoms persist or worsen. Patient is aware when to return to the clinic for a follow-up visit. Patient educated on when it is appropriate to go to the emergency department.   Mary-Margaret Hassell Done, FNP

## 2021-06-07 ENCOUNTER — Ambulatory Visit
Admission: RE | Admit: 2021-06-07 | Discharge: 2021-06-07 | Disposition: A | Discharge status: Home / Self Care | Payer: Self-pay | Source: Ambulatory Visit | Attending: Nurse Practitioner | Admitting: Nurse Practitioner

## 2021-06-07 ENCOUNTER — Other Ambulatory Visit: Payer: Self-pay | Admitting: Nurse Practitioner

## 2021-06-07 DIAGNOSIS — Z139 Encounter for screening, unspecified: Secondary | ICD-10-CM

## 2021-06-30 ENCOUNTER — Encounter: Payer: Self-pay | Admitting: Nurse Practitioner

## 2021-06-30 ENCOUNTER — Ambulatory Visit: Payer: Commercial Managed Care - PPO | Admitting: Nurse Practitioner

## 2021-06-30 VITALS — BP 115/63 | HR 54 | Temp 97.6°F | Resp 20 | Ht 64.0 in | Wt 204.0 lb

## 2021-06-30 DIAGNOSIS — E1140 Type 2 diabetes mellitus with diabetic neuropathy, unspecified: Secondary | ICD-10-CM

## 2021-06-30 DIAGNOSIS — E039 Hypothyroidism, unspecified: Secondary | ICD-10-CM | POA: Diagnosis not present

## 2021-06-30 DIAGNOSIS — G43109 Migraine with aura, not intractable, without status migrainosus: Secondary | ICD-10-CM

## 2021-06-30 DIAGNOSIS — G43411 Hemiplegic migraine, intractable, with status migrainosus: Secondary | ICD-10-CM

## 2021-06-30 DIAGNOSIS — I10 Essential (primary) hypertension: Secondary | ICD-10-CM | POA: Diagnosis not present

## 2021-06-30 DIAGNOSIS — E782 Mixed hyperlipidemia: Secondary | ICD-10-CM | POA: Diagnosis not present

## 2021-06-30 DIAGNOSIS — G43711 Chronic migraine without aura, intractable, with status migrainosus: Secondary | ICD-10-CM

## 2021-06-30 DIAGNOSIS — F411 Generalized anxiety disorder: Secondary | ICD-10-CM

## 2021-06-30 DIAGNOSIS — G250 Essential tremor: Secondary | ICD-10-CM

## 2021-06-30 DIAGNOSIS — E1142 Type 2 diabetes mellitus with diabetic polyneuropathy: Secondary | ICD-10-CM

## 2021-06-30 DIAGNOSIS — M5431 Sciatica, right side: Secondary | ICD-10-CM

## 2021-06-30 DIAGNOSIS — F3341 Major depressive disorder, recurrent, in partial remission: Secondary | ICD-10-CM

## 2021-06-30 DIAGNOSIS — G5601 Carpal tunnel syndrome, right upper limb: Secondary | ICD-10-CM

## 2021-06-30 LAB — BAYER DCA HB A1C WAIVED: HB A1C (BAYER DCA - WAIVED): 6.8 % — ABNORMAL HIGH (ref 4.8–5.6)

## 2021-06-30 MED ORDER — TOPIRAMATE 50 MG PO TABS: 50.0000 mg | ORAL_TABLET | Freq: Two times a day (BID) | ORAL | 2 refills | Status: DC

## 2021-06-30 MED ORDER — GABAPENTIN 300 MG PO CAPS: 300.0000 mg | ORAL_CAPSULE | Freq: Three times a day (TID) | ORAL | 3 refills | Status: DC

## 2021-06-30 MED ORDER — BUPROPION HCL ER (XL) 150 MG PO TB24: 450.0000 mg | ORAL_TABLET | Freq: Every day | ORAL | 2 refills | Status: DC

## 2021-06-30 MED ORDER — PREDNISONE 20 MG PO TABS
40.0000 mg | ORAL_TABLET | Freq: Every day | ORAL | 0 refills | Status: AC
Start: 2021-06-30 — End: 2021-07-05

## 2021-06-30 MED ORDER — BENAZEPRIL HCL 20 MG PO TABS: 20.0000 mg | ORAL_TABLET | Freq: Every day | ORAL | 1 refills | Status: DC

## 2021-06-30 MED ORDER — ATORVASTATIN CALCIUM 40 MG PO TABS: 40.0000 mg | ORAL_TABLET | Freq: Every day | ORAL | 4 refills | Status: DC

## 2021-06-30 MED ORDER — RIZATRIPTAN BENZOATE 10 MG PO TABS: 10.0000 mg | ORAL_TABLET | ORAL | 2 refills | Status: DC | PRN

## 2021-06-30 MED ORDER — PROPRANOLOL HCL 40 MG PO TABS: 40.0000 mg | ORAL_TABLET | Freq: Two times a day (BID) | ORAL | 1 refills | Status: DC

## 2021-06-30 MED ORDER — FENOFIBRATE 160 MG PO TABS: 160.0000 mg | ORAL_TABLET | Freq: Every day | ORAL | 4 refills | Status: DC

## 2021-06-30 MED ORDER — SITAGLIPTIN PHOS-METFORMIN HCL 50-1000 MG PO TABS: 1.0000 | ORAL_TABLET | Freq: Two times a day (BID) | ORAL | 1 refills | Status: DC

## 2021-06-30 MED ORDER — GLIPIZIDE ER 10 MG PO TB24: 10.0000 mg | ORAL_TABLET | Freq: Every day | ORAL | 1 refills | Status: DC

## 2021-06-30 MED ORDER — CITALOPRAM HYDROBROMIDE 40 MG PO TABS: 40.0000 mg | ORAL_TABLET | Freq: Every day | ORAL | 1 refills | Status: DC

## 2021-06-30 NOTE — Patient Instructions (Signed)
Carpal Tunnel Syndrome Carpal tunnel syndrome is a condition that causes pain, weakness, and numbness in your hand and arm. Numbness is when you cannot feel an area in your body. The carpal tunnel is a narrow area that is on the palm side of your wrist. Repeated wrist motion or certain diseases may cause swelling in the tunnel. This swelling can pinch the main nerve in the wrist. This nerve is called the median nerve. What are the causes? This condition may be caused by: Moving your hand and wrist over and over again while doing a task. Injury to the wrist. Arthritis. A sac of fluid (cyst) or abnormal growth (tumor) in the carpal tunnel. Fluid buildup during pregnancy. Use of tools that vibrate. Sometimes the cause is not known. What increases the risk? The following factors may make you more likely to have this condition: Having a job that makes you do these things: Move your hand over and over again. Work with tools that vibrate, such as drills or sanders. Being a woman. Having diabetes, obesity, thyroid problems, or kidney failure. What are the signs or symptoms? Symptoms of this condition include: A tingling feeling in your fingers. Tingling or loss of feeling in your hand. Pain in your entire arm. This pain may get worse when you bend your wrist and elbow for a long time. Pain in your wrist that goes up your arm to your shoulder. Pain that goes down into your palm or fingers. Weakness in your hands. You may find it hard to grab and hold items. You may feel worse at night. How is this treated? This condition may be treated with: Lifestyle changes. You will be asked to stop or change the activity that caused your problem. Doing exercises and activities that make bones, muscles, and tendons stronger (physical therapy). Learning how to use your hand again (occupational therapy). Medicines for pain and swelling. You may have injections in your wrist. A wrist splint or  brace. Surgery. Follow these instructions at home: If you have a splint or brace: Wear the splint or brace as told by your doctor. Take it off only as told by your doctor. Loosen the splint if your fingers: Tingle. Become numb. Turn cold and blue. Keep the splint or brace clean. If the splint or brace is not waterproof: Do not let it get wet. Cover it with a watertight covering when you take a bath or a shower. Managing pain, stiffness, and swelling If told, put ice on the painful area: If you have a removable splint or brace, remove it as told by your doctor. Put ice in a plastic bag. Place a towel between your skin and the bag. Leave the ice on for 20 minutes, 2-3 times per day. Do not fall asleep with the cold pack on your skin. Take off the ice if your skin turns bright red. This is very important. If you cannot feel pain, heat, or cold, you have a greater risk of damage to the area. Move your fingers often to reduce stiffness and swelling. General instructions Take over-the-counter and prescription medicines only as told by your doctor. Rest your wrist from any activity that may cause pain. If needed, talk with your boss at work about changes that can help your wrist heal. Do exercises as told by your doctor, physical therapist, or occupational therapist. Keep all follow-up visits. Contact a doctor if: You have new symptoms. Medicine does not help your pain. Your symptoms get worse. Get help right away  if: You have very bad numbness or tingling in your wrist or hand. Summary Carpal tunnel syndrome is a condition that causes pain in your hand and arm. It is often caused by repeated wrist motions. Lifestyle changes and medicines are used to treat this problem. Surgery may help in very bad cases. Follow your doctor's instructions about wearing a splint, resting your wrist, keeping follow-up visits, and calling for help. This information is not intended to replace advice given  to you by your health care provider. Make sure you discuss any questions you have with your health care provider. Document Revised: 10/08/2019 Document Reviewed: 10/08/2019 Elsevier Patient Education  [place].

## 2021-06-30 NOTE — Progress Notes (Signed)
Subjective:    Patient ID: [name], female    DOB: [birth year], 61 y.o.   MRN: [id number]   Chief Complaint: medical management of chronic issues     HPI:  AFRA Castro is a 61 y.o. who identifies as a female who was assigned female at birth.   Social history: Lives with: by herself   Comes in today for follow up of the following chronic medical issues:  1. Primary hypertension No c/o chest pain, sob or headache. Does not check blood pressure at home. BP Readings from Last 3 Encounters:  05/19/21 (!) 97/58  03/06/21 119/66  12/30/20 104/66     2. Mixed hyperlipidemia Does not watch diet and does no dedicated exercise. Wt Readings from Last 3 Encounters:  05/19/21 206 lb (93.4 kg)  03/06/21 201 lb 3.2 oz (91.3 kg)  12/30/20 208 lb (94.3 kg)     3. Acquired hypothyroidism No problems that aware of. Lab Results  Component Value Date   TSH 1.780 11/22/2020     4. Type 2 diabetes mellitus with diabetic polyneuropathy, without long-term current use of insulin (HCC) She has not been checking er blood sugars at home. She thinks they have been running low. Lab Results  Component Value Date   HGBA1C 7.0 (H) 11/22/2020     5. Intractable hemiplegic migraine with status migrainosus Has migraine about 1-2 times a month. Topamax is good at preventing and maxalt works well for treatment.  6. Anxiety state Celexa helps keep her calm. GAD 7 : Generalized Anxiety Score 05/19/2021 03/06/2021 12/30/2020 11/22/2020  Nervous, Anxious, on Edge 0 0 0 0  Control/stop worrying 1 0 0 0  Worry too much - different things 0 1 - 1  Trouble relaxing 0 1 0 0  Restless 0 0 0 0  Easily annoyed or irritable 0 0 0 0  Afraid - awful might happen 0 0 0 0  Total GAD 7 Score 1 2 - 1  Anxiety Difficulty Not difficult at all Somewhat difficult Not difficult at all Not difficult at all      7. Recurrent major depressive disorder, in partial remission ([place]) Again is on  celexa and is doing well right now. Depression screen [facility] 2/9 05/19/2021 03/06/2021 12/30/2020  Decreased Interest 0 0 0  Down, Depressed, Hopeless 0 0 0  PHQ - 2 Score 0 0 0  Altered sleeping 1 1 1   Tired, decreased energy 0 0 0  Change in appetite 0 0 0  Feeling bad or failure about yourself  0 0 0  Trouble concentrating 0 0 0  Moving slowly or fidgety/restless 0 0 0  Suicidal thoughts 0 1 0  PHQ-9 Score 1 2 1   Difficult doing work/chores Not difficult at all Somewhat difficult Not difficult at all  Some recent data might be hidden      8. Morbid obesity ([place]) No recent weight changes Wt Readings from Last 3 Encounters:  06/30/21 204 lb (92.5 kg)  05/19/21 206 lb (93.4 kg)  03/06/21 201 lb 3.2 oz (91.3 kg)   BMI Readings from Last 3 Encounters:  06/30/21 35.02 kg/m  05/19/21 35.36 kg/m  03/06/21 34.54 kg/m     New complaints: Numbness and tingling on right side- arm and leg. Cannot pick up anything with right hand. Started 3 days ago. No speech problem , no swallowing difficulity. [Pain starts in right hip and radiates down back of right leg. Is intermittent. Her right hand has numbness in  thumb index and ring finger now and she has history of carpal tunnel int that hand. She is on neurontin and that helps.  Allergies  Allergen Reactions   Aspirin Anaphylaxis   Bee Venom Anaphylaxis   Benadryl [Diphenhydramine Hcl] Anaphylaxis   Morphine And Related Shortness Of Breath and Itching   Vicks Formula 44 Cough-Cold Pm [Dm-Apap-Cpm] Shortness Of Breath and Rash    Not anaphylaxis   Outpatient Encounter Medications as of 06/30/2021  Medication Sig   albuterol (VENTOLIN HFA) 108 (90 Base) MCG/ACT inhaler Inhale 2 puffs into the lungs every 4 (four) hours as needed for wheezing or shortness of breath. (NEEDS TO BE SEEN)   atorvastatin (LIPITOR) 40 MG tablet Take 1 tablet (40 mg total) by mouth daily.   benazepril (LOTENSIN) 20 MG tablet Take 1 tablet (20 mg total) by mouth  daily.   buPROPion (WELLBUTRIN XL) 150 MG 24 hr tablet Take 3 tablets (450 mg total) by mouth daily.   citalopram (CELEXA) 40 MG tablet Take 1 tablet (40 mg total) by mouth daily.   EPINEPHrine 0.3 mg/0.3 mL IJ SOAJ injection Inject 0.3 mg into the muscle as needed for anaphylaxis.   fenofibrate 160 MG tablet Take 1 tablet (160 mg total) by mouth daily.   gabapentin (NEURONTIN) 300 MG capsule Take 1 capsule (300 mg total) by mouth 3 (three) times daily.   glipiZIDE (GLUCOTROL XL) 10 MG 24 hr tablet Take 1 tablet (10 mg total) by mouth daily.   meclizine (ANTIVERT) 25 MG tablet Take 1 tablet (25 mg total) by mouth 2 (two) times daily as needed for dizziness.   ondansetron (ZOFRAN) 4 MG tablet Take 1 tablet (4 mg total) by mouth every 8 (eight) hours as needed for nausea or vomiting.   propranolol (INDERAL) 40 MG tablet Take 1 tablet (40 mg total) by mouth 2 (two) times daily. (Needs to be seen before next refill)   rizatriptan (MAXALT) 10 MG tablet Take 1 tablet (10 mg total) by mouth as needed for migraine. May repeat in 2 hours if needed   sitaGLIPtin-metformin (JANUMET) 50-1000 MG tablet Take 1 tablet by mouth 2 (two) times daily with breakfast and lunch.   topiramate (TOPAMAX) 50 MG tablet Take 1 tablet (50 mg total) by mouth 2 (two) times daily.   No facility-administered encounter medications on file as of 06/30/2021.    Past Surgical History:  Procedure Laterality Date   ABDOMINAL HYSTERECTOMY  02/2007   + lysis of adhesions   APPENDECTOMY  1990s   ruptured, late 94s   BILATERAL OOPHORECTOMY      in 2 surgeries prior to 9/08   COLONOSCOPY  02/2010    normal upper endoscopy, single colonic polyp,   VENTRAL HERNIA REPAIR     x3    No family history on file.    Controlled substance contract: n/a     Review of Systems  Constitutional:  Negative for diaphoresis.  Eyes:  Negative for pain.  Respiratory:  Negative for shortness of breath.   Cardiovascular:  Negative for chest  pain, palpitations and leg swelling.  Gastrointestinal:  Negative for abdominal pain.  Endocrine: Negative for polydipsia.  Skin:  Negative for rash.  Neurological:  Negative for dizziness, weakness and headaches.  Hematological:  Does not bruise/bleed easily.  All other systems reviewed and are negative.     Objective:   Physical Exam Vitals and nursing note reviewed.  Constitutional:      General: She is not in acute distress.  Appearance: Normal appearance. She is well-developed.  HENT:     Head: Normocephalic.     Right Ear: Tympanic membrane normal.     Left Ear: Tympanic membrane normal.     Nose: Nose normal.     Mouth/Throat:     Mouth: Mucous membranes are moist.  Eyes:     Pupils: Pupils are equal, round, and reactive to light.  Neck:     Vascular: No carotid bruit or JVD.  Cardiovascular:     Rate and Rhythm: Normal rate and regular rhythm.     Heart sounds: Normal heart sounds.  Pulmonary:     Effort: Pulmonary effort is normal. No respiratory distress.     Breath sounds: Normal breath sounds. No wheezing or rales.  Chest:     Chest wall: No tenderness.  Abdominal:     General: Bowel sounds are normal. There is no distension or abdominal bruit.     Palpations: Abdomen is soft. There is no hepatomegaly, splenomegaly, mass or pulsatile mass.     Tenderness: There is no abdominal tenderness.  Musculoskeletal:        General: Normal range of motion.     Cervical back: Normal range of motion and neck supple.     Comments: SLR (-) bil Motor strength and sensation intact upperand lower ext.  Lymphadenopathy:     Cervical: No cervical adenopathy.  Skin:    General: Skin is warm and dry.  Neurological:     Mental Status: She is alert and oriented to person, place, and time.     Deep Tendon Reflexes: Reflexes are normal and symmetric.  Psychiatric:        Behavior: Behavior normal.        Thought Content: Thought content normal.        Judgment: Judgment  normal.    BP 115/63    Pulse (!) 54    Temp 97.6 F (36.4 C) (Temporal)    Resp 20    Ht 5' 4"  (1.626 m)    Wt 204 lb (92.5 kg)    SpO2 98%    BMI 35.02 kg/m   HGBA1c 6.8%     Assessment & Plan:   KARALYNN COTTONE comes in today with chief complaint of Pain and numbness in right hand and Medical Management of Chronic Issues   Diagnosis and orders addressed:  1. Primary hypertension Low sodium diet - CBC with Differential/Platelet - CMP14+EGFR - benazepril (LOTENSIN) 20 MG tablet; Take 1 tablet (20 mg total) by mouth daily.  Dispense: 90 tablet; Refill: 1  2. Mixed hyperlipidemia Low fat diet - Lipid panel - atorvastatin (LIPITOR) 40 MG tablet; Take 1 tablet (40 mg total) by mouth daily.  Dispense: 30 tablet; Refill: 4 - fenofibrate 160 MG tablet; Take 1 tablet (160 mg total) by mouth daily.  Dispense: 30 tablet; Refill: 4  3. Acquired hypothyroidism Labs pending - Thyroid Panel With TSH  4. Type 2 diabetes mellitus with diabetic polyneuropathy, without long-term current use of insulin (HCC) Continue to wtahc carbs in diet - Bayer DCA Hb A1c Waived - glipiZIDE (GLUCOTROL XL) 10 MG 24 hr tablet; Take 1 tablet (10 mg total) by mouth daily.  Dispense: 180 tablet; Refill: 1 - sitaGLIPtin-metformin (JANUMET) 50-1000 MG tablet; Take 1 tablet by mouth 2 (two) times daily with breakfast and lunch.  Dispense: 90 tablet; Refill: 1  5. Intractable hemiplegic migraine with status migrainosus  6. Anxiety state Stress management - buPROPion (WELLBUTRIN XL) 150 MG  24 hr tablet; Take 3 tablets (450 mg total) by mouth daily.  Dispense: 90 tablet; Refill: 2  7. Recurrent major depressive disorder, in partial remission (HCC) - citalopram (CELEXA) 40 MG tablet; Take 1 tablet (40 mg total) by mouth daily.  Dispense: 180 tablet; Refill: 1  8. Morbid obesity ([place]) Discussed diet and exercise for person with BMI >25 Will recheck weight in 3-6 months   9. Carpal tunnel syndrome of  right wrist Wear wrist brace - predniSONE (DELTASONE) 20 MG tablet; Take 2 tablets (40 mg total) by mouth daily with breakfast for 5 days. 2 po daily for 5 days  Dispense: 10 tablet; Refill: 0  10. Sciatica of right side stretches - predniSONE (DELTASONE) 20 MG tablet; Take 2 tablets (40 mg total) by mouth daily with breakfast for 5 days. 2 po daily for 5 days  Dispense: 10 tablet; Refill: 0  11. Essential tremor - propranolol (INDERAL) 40 MG tablet; Take 1 tablet (40 mg total) by mouth 2 (two) times daily. (Needs to be seen before next refill)  Dispense: 180 tablet; Refill: 1  12. Intractable chronic migraine without aura and with status migrainosus - topiramate (TOPAMAX) 50 MG tablet; Take 1 tablet (50 mg total) by mouth 2 (two) times daily.  Dispense: 60 tablet; Refill: 2  13. Migraine with aura and without status migrainosus, not intractable - rizatriptan (MAXALT) 10 MG tablet; Take 1 tablet (10 mg total) by mouth as needed for migraine. May repeat in 2 hours if needed  Dispense: 10 tablet; Refill: 2  14. Type 2 diabetes mellitus with diabetic neuropathy, without long-term current use of insulin (HCC) - gabapentin (NEURONTIN) 300 MG capsule; Take 1 capsule (300 mg total) by mouth 3 (three) times daily.  Dispense: 90 capsule; Refill: 3   Labs pending Health Maintenance reviewed Diet and exercise encouraged  Follow up plan: 3 months   Mary-Margaret Hassell Done, FNP

## 2021-07-01 LAB — CMP14+EGFR
ALT: 16 IU/L (ref 0–32)
AST: 12 IU/L (ref 0–40)
Albumin/Globulin Ratio: 1.8 (ref 1.2–2.2)
Albumin: 4.5 g/dL (ref 3.8–4.9)
Alkaline Phosphatase: 64 IU/L (ref 44–121)
BUN/Creatinine Ratio: 21 (ref 12–28)
BUN: 24 mg/dL (ref 8–27)
Bilirubin Total: 0.2 mg/dL (ref 0.0–1.2)
CO2: 20 mmol/L (ref 20–29)
Calcium: 9.9 mg/dL (ref 8.7–10.3)
Chloride: 103 mmol/L (ref 96–106)
Creatinine, Ser: 1.15 mg/dL — ABNORMAL HIGH (ref 0.57–1.00)
Globulin, Total: 2.5 g/dL (ref 1.5–4.5)
Glucose: 146 mg/dL — ABNORMAL HIGH (ref 70–99)
Potassium: 5 mmol/L (ref 3.5–5.2)
Sodium: 138 mmol/L (ref 134–144)
Total Protein: 7 g/dL (ref 6.0–8.5)
eGFR: 55 mL/min/{1.73_m2} — ABNORMAL LOW (ref >59)

## 2021-07-01 LAB — THYROID PANEL WITH TSH
Free Thyroxine Index: 1.5 (ref 1.2–4.9)
T3 Uptake Ratio: 24 % (ref 24–39)
T4, Total: 6.2 ug/dL (ref 4.5–12.0)
TSH: 2.2 u[IU]/mL (ref 0.450–4.500)

## 2021-07-01 LAB — CBC WITH DIFFERENTIAL/PLATELET
Basophils Absolute: 0 10*3/uL (ref 0.0–0.2)
Basos: 1 %
EOS (ABSOLUTE): 0.1 10*3/uL (ref 0.0–0.4)
Eos: 1 %
Hematocrit: 37.3 % (ref 34.0–46.6)
Hemoglobin: 12.3 g/dL (ref 11.1–15.9)
Immature Grans (Abs): 0 10*3/uL (ref 0.0–0.1)
Immature Granulocytes: 0 %
Lymphocytes Absolute: 2.5 10*3/uL (ref 0.7–3.1)
Lymphs: 28 %
MCH: 28.2 pg (ref 26.6–33.0)
MCHC: 33 g/dL (ref 31.5–35.7)
MCV: 86 fL (ref 79–97)
Monocytes Absolute: 0.4 10*3/uL (ref 0.1–0.9)
Monocytes: 5 %
Neutrophils Absolute: 5.7 10*3/uL (ref 1.4–7.0)
Neutrophils: 65 %
Platelets: 231 10*3/uL (ref 150–450)
RBC: 4.36 x10E6/uL (ref 3.77–5.28)
RDW: 12.8 % (ref 11.7–15.4)
WBC: 8.7 10*3/uL (ref 3.4–10.8)

## 2021-07-01 LAB — LIPID PANEL
Chol/HDL Ratio: 4.9 ratio — ABNORMAL HIGH (ref 0.0–4.4)
Cholesterol, Total: 304 mg/dL — ABNORMAL HIGH (ref 100–199)
HDL: 62 mg/dL (ref >39)
LDL Chol Calc (NIH): 216 mg/dL — ABNORMAL HIGH (ref 0–99)
Triglycerides: 144 mg/dL (ref 0–149)
VLDL Cholesterol Cal: 26 mg/dL (ref 5–40)

## 2021-07-03 ENCOUNTER — Telehealth: Payer: Self-pay | Admitting: Nurse Practitioner

## 2021-07-03 DIAGNOSIS — R200 Anesthesia of skin: Secondary | ICD-10-CM

## 2021-07-03 NOTE — Telephone Encounter (Signed)
Pt calling back about this message 

## 2021-07-03 NOTE — Telephone Encounter (Signed)
Called and spoke with patient and she verbalized understanding. Advised we would order STAT referral and write her a work note for the week and leave up front for her to pick up.

## 2021-07-03 NOTE — Telephone Encounter (Signed)
Needs referral to ortho stat for right hand numbness

## 2021-07-09 ENCOUNTER — Other Ambulatory Visit: Payer: Self-pay | Admitting: Nurse Practitioner

## 2021-07-09 DIAGNOSIS — E782 Mixed hyperlipidemia: Secondary | ICD-10-CM

## 2021-07-11 ENCOUNTER — Other Ambulatory Visit: Payer: Self-pay

## 2021-07-11 ENCOUNTER — Encounter: Payer: Self-pay | Admitting: Orthopedic Surgery

## 2021-07-11 ENCOUNTER — Ambulatory Visit: Payer: Commercial Managed Care - PPO | Admitting: Orthopedic Surgery

## 2021-07-11 DIAGNOSIS — R202 Paresthesia of skin: Secondary | ICD-10-CM

## 2021-07-11 DIAGNOSIS — R200 Anesthesia of skin: Secondary | ICD-10-CM

## 2021-07-11 NOTE — Progress Notes (Signed)
Office Visit Note   Patient: [name]           Date of Birth: 1960/09/08           MRN: [id number] Visit Date: 07/11/2021              Requested by: Chevis Pretty, [address] [place],  [place] [address] PCP: Chevis Pretty, FNP   Assessment & Plan: Visit Diagnoses:  1. Numbness and tingling in right hand     Plan: Discussed with patient that her history and exam seem consistent with carpal and cubital tunnel syndrome.  I would like to get a EMG/nerve conduction study of the right upper extremity to further evaluate her symptoms.  I can see her back in the office once the study is completed to discuss the results.  In the meantime, she can continue wearing her brace at night.    Follow-Up Instructions: No follow-ups on file.   Orders:  No orders of the defined types were placed in this encounter.  No orders of the defined types were placed in this encounter.     Procedures: No procedures performed   Clinical Data: No additional findings.   Subjective: Chief Complaint  Patient presents with   Right Hand - Numbness, Weakness    Onset x 1 month, + weakness, swelling, dropping things, PCP thought it could be related to neuropathy, works as a Teacher, early years/pre, Haematologist,     This is a 61 year old right-hand-dominant female who works as a Teacher, early years/pre at [employer] and presents with numbness and tingling in the right hand.  She had a left carpal tunnel release done in 2003 and feels that her symptoms in the right hand are exactly how her left hand felt back then.  She describes numbness and tingling in all of her fingers.  Is been worsening over the last month or so.  She is noted that she is dropping things at work and has some subjective hand weakness.  She previously had nocturnal symptoms 7 nights a week but this is improved wearing her brace.  When she has nocturnal symptoms she props her hand up on a pillow which provides slight  relief.  Risk Factors Diabetes: Yes Hypothyroid: No C-spine: No Inflammatory: No History of trauma: No  Nocturnal Symptoms: Yes  Electrodiagnostic studies: None on R  Treatment to date: Night splint   Weakness Associated symptoms include weakness.   Review of Systems  Neurological:  Positive for weakness.    Objective: Vital Signs: BP 131/82 (BP Location: Left Arm, Patient Position: Sitting)    Pulse 64    Ht 5\' 4"  (1.626 m)    Wt 204 lb (92.5 kg)    BMI 35.02 kg/m   Physical Exam Constitutional:      Appearance: Normal appearance.  Cardiovascular:     Rate and Rhythm: Normal rate.     Pulses: Normal pulses.  Pulmonary:     Effort: Pulmonary effort is normal.  Skin:    General: Skin is warm and dry.     Capillary Refill: Capillary refill takes less than 2 seconds.  Neurological:     Mental Status: She is alert.    Right Hand Exam   Tenderness  The patient is experiencing no tenderness.   Range of Motion  The patient has normal right wrist ROM.   Other  Erythema: absent Sensation: decreased Pulse: present  Comments:  + CT compression test.  Equivocal Tinel at wrist.  Negative Phalen test.  + Tinel at elbow without ulnar nerve subluxation/instability. 2PD >106mm in all fingers.  Thenar motor strength 4/5 without obvious thenar atrophy.      Specialty Comments:  No specialty comments available.  Imaging: No results found.   PMFS History: Patient Active Problem List   Diagnosis Date Noted   Numbness and tingling in right hand 07/11/2021   Diverticulosis 07/13/2016   Morbid obesity ([place]) 03/31/2015   Hyperlipidemia    Hypothyroidism    Hypertension    POLYP, COLON 04/13/2010   Diabetes ([place]) 04/13/2010   Anxiety state 04/13/2010   Depression 04/13/2010   Asthma 04/13/2010   Migraines 04/13/2010   Past Medical History:  Diagnosis Date   Abdominal discomfort    Chronic abdominal and pelvic pain resulting in significant loss of time from  work   Anxiety    with depression   Asthma    Chest pain    Diabetes mellitus    Drug overdose 2009   60 Naprosyn, psychiatric admission   Hyperlipidemia    Severe   Hypertension    Hypothyroidism    Migraines     No family history on file.  Past Surgical History:  Procedure Laterality Date   ABDOMINAL HYSTERECTOMY  02/2007   + lysis of adhesions   APPENDECTOMY  1990s   ruptured, late 14s   BILATERAL OOPHORECTOMY      in 2 surgeries prior to 9/08   COLONOSCOPY  02/2010    normal upper endoscopy, single colonic polyp,   VENTRAL HERNIA REPAIR     x3   Social History   Occupational History   Occupation: Tourist information centre manager  Tobacco Use   Smoking status: Never   Smokeless tobacco: Never  Vaping Use   Vaping Use: Not on file  Substance and Sexual Activity   Alcohol use: No   Drug use: No   Sexual activity: Not Currently

## 2021-07-26 ENCOUNTER — Encounter: Payer: 59 | Admitting: Physical Medicine and Rehabilitation

## 2021-07-26 ENCOUNTER — Encounter: Payer: Self-pay | Admitting: Physical Medicine and Rehabilitation

## 2021-07-26 ENCOUNTER — Other Ambulatory Visit: Payer: Self-pay

## 2021-07-26 ENCOUNTER — Ambulatory Visit (INDEPENDENT_AMBULATORY_CARE_PROVIDER_SITE_OTHER): Payer: 59 | Admitting: Physical Medicine and Rehabilitation

## 2021-07-26 DIAGNOSIS — R202 Paresthesia of skin: Secondary | ICD-10-CM | POA: Diagnosis not present

## 2021-07-26 NOTE — Progress Notes (Signed)
Pt state right hand pain, numbness and tingling. Pt state she feels a lot of pain and numbness that she has been dropping things. Pt state don't take anything for the pain. Pt state she is left handed.   Numeric Pain Rating Scale and Functional Assessment Average Pain 6   In the last MONTH (on 0-10 scale) has pain interfered with the following?  1. General activity like being  able to carry out your everyday physical activities such as walking, climbing stairs, carrying groceries, or moving a chair?  Rating(10)

## 2021-07-29 NOTE — Progress Notes (Signed)
[name] - 61 y.o. female MRN [id number]  Date of birth: June 11, 1961  Office Visit Note: Visit Date: 07/26/2021 PCP: Chevis Pretty, FNP Referred by: Mary Cooter, MD  Subjective: Chief Complaint  Patient presents with   Right Hand - Pain, Numbness, Tingling   HPI:  [name] is a 61 y.o. female who comes in today at the request of Dr. Sherilyn Castro for electrodiagnostic study of the Right upper extremities.  Patient is Left hand dominant. She had a left carpal tunnel release in 2003 and feels that symptoms in the right hand are exactly how her left hand felt back then.  She reports a lot of pain numbness and tingling particular in the radial digits on the right.  She does report dropping objects at times.  She rates her pain as a 6 out of 10.  Her case is complicated by diabetes.  No frank radicular symptoms.  ROS Otherwise per HPI.  Assessment & Plan: Visit Diagnoses:    ICD-10-CM   1. Paresthesia of skin  R20.2 NCV with EMG (electromyography)      Plan: mpression: The above electrodiagnostic study is ABNORMAL and reveals evidence of a moderate to severe right median nerve entrapment at the wrist (carpal tunnel syndrome) affecting sensory and motor components.   There is no significant electrodiagnostic evidence of any other focal nerve entrapment, brachial plexopathy or lumbosacral plexopathy.   Recommendations: 1.  Follow-up with referring physician. 2.  Continue current management of symptoms. 3.  Continue use of resting splint at night-time and as needed during the day. 4.  Suggest surgical evaluation.  Meds & Orders: No orders of the defined types were placed in this encounter.   Orders Placed This Encounter  Procedures   NCV with EMG (electromyography)    Follow-up: Return in about 2 weeks (around 08/09/2021) for Mary Cooter, MD.   Procedures: No procedures performed  EMG & NCV Findings: Evaluation of the right median motor  nerve showed prolonged distal onset latency (7.0 ms) and decreased conduction velocity (Elbow-Wrist, 41 m/s).  The right median (across palm) sensory nerve showed prolonged distal peak latency (Wrist, 12.5 ms), reduced amplitude (6.4 V), and prolonged distal peak latency (Palm, 4.5 ms).  The right ulnar sensory nerve showed reduced amplitude (9.7 V).  All remaining nerves (as indicated in the following tables) were within normal limits.    All examined muscles (as indicated in the following table) showed no evidence of electrical instability.    Impression: The above electrodiagnostic study is ABNORMAL and reveals evidence of a moderate to severe right median nerve entrapment at the wrist (carpal tunnel syndrome) affecting sensory and motor components.   There is no significant electrodiagnostic evidence of any other focal nerve entrapment, brachial plexopathy or lumbosacral plexopathy.   Recommendations: 1.  Follow-up with referring physician. 2.  Continue current management of symptoms. 3.  Continue use of resting splint at night-time and as needed during the day. 4.  Suggest surgical evaluation.  ___________________________ Laurence Spates FAAPMR Board Certified, American Board of Physical Medicine and Rehabilitation    Nerve Conduction Studies Anti Sensory Summary Table   Stim Site NR Peak (ms) Norm Peak (ms) P-T Amp (V) Norm P-T Amp Site1 Site2 Delta-P (ms) Dist (cm) Vel (m/s) Norm Vel (m/s)  Right Median Acr Palm Anti Sensory (2nd Digit)  30.6C  Wrist    *12.5 <3.6 *6.4 >10 Wrist Palm 8.0 0.0    Palm    *4.5 <2.0 1.7  Right Radial Anti Sensory (Base 1st Digit)  30.9C  Wrist    2.1 <3.1 22.8  Wrist Base 1st Digit 2.1 0.0    Right Ulnar Anti Sensory (5th Digit)  30.9C  Wrist    3.2 <3.7 *9.7 >15.0 Wrist 5th Digit 3.2 14.0 44 >38   Motor Summary Table   Stim Site NR Onset (ms) Norm Onset (ms) O-P Amp (mV) Norm O-P Amp Site1 Site2 Delta-0 (ms) Dist (cm) Vel (m/s) Norm  Vel (m/s)  Right Median Motor (Abd Poll Brev)  31C  Wrist    *7.0 <4.2 7.7 >5 Elbow Wrist 4.6 19.0 *41 >50  Elbow    11.6  1.8  Axilla Elbow 2.2 0.0    Axilla    13.8  0.1         Right Ulnar Motor (Abd Dig Min)  30.7C  Wrist    3.0 <4.2 7.8 >3 B Elbow Wrist 3.3 18.0 55 >53  B Elbow    6.3  6.9  A Elbow B Elbow 1.9 10.0 53 >53  A Elbow    8.2  4.8          EMG   Side Muscle Nerve Root Ins Act Fibs Psw Amp Dur Poly Recrt Int Fraser Din Comment  Right Abd Poll Brev Median C8-T1 Nml Nml Nml Nml Nml 0 Nml Nml   Right 1stDorInt Ulnar C8-T1 Nml Nml Nml Nml Nml 0 Nml Nml   Right PronatorTeres Median C6-7 Nml Nml Nml Nml Nml 0 Nml Nml   Right Biceps Musculocut C5-6 Nml Nml Nml Nml Nml 0 Nml Nml   Right Deltoid Axillary C5-6 Nml Nml Nml Nml Nml 0 Nml Nml     Nerve Conduction Studies Anti Sensory Left/Right Comparison   Stim Site L Lat (ms) R Lat (ms) L-R Lat (ms) L Amp (V) R Amp (V) L-R Amp (%) Site1 Site2 L Vel (m/s) R Vel (m/s) L-R Vel (m/s)  Median Acr Palm Anti Sensory (2nd Digit)  30.6C  Wrist  *12.5   *6.4  Wrist Palm     Palm  *4.5   1.7        Radial Anti Sensory (Base 1st Digit)  30.9C  Wrist  2.1   22.8  Wrist Base 1st Digit     Ulnar Anti Sensory (5th Digit)  30.9C  Wrist  3.2   *9.7  Wrist 5th Digit  44    Motor Left/Right Comparison   Stim Site L Lat (ms) R Lat (ms) L-R Lat (ms) L Amp (mV) R Amp (mV) L-R Amp (%) Site1 Site2 L Vel (m/s) R Vel (m/s) L-R Vel (m/s)  Median Motor (Abd Poll Brev)  31C  Wrist  *7.0   7.7  Elbow Wrist  *41   Elbow  11.6   1.8  Axilla Elbow     Axilla  13.8   0.1        Ulnar Motor (Abd Dig Min)  30.7C  Wrist  3.0   7.8  B Elbow Wrist  55   B Elbow  6.3   6.9  A Elbow B Elbow  53   A Elbow  8.2   4.8           Waveforms:             Clinical History: No specialty comments available.     Objective:  VS:  HT:     WT:    BMI:      BP:  HR: bpm   TEMP: ( )   RESP:  Physical Exam Musculoskeletal:        General: No swelling,  tenderness or deformity.     Comments: Inspection reveals no atrophy of the bilateral APB or FDI or hand intrinsics. There is no swelling, color changes, allodynia or dystrophic changes. There is 5 out of 5 strength in the bilateral wrist extension, finger abduction and long finger flexion. There is intact sensation to light touch in all dermatomal and peripheral nerve distributions.  There is a positive Phalen's test on the right. There is a negative Hoffmann's test bilaterally.  Skin:    General: Skin is warm and dry.     Findings: No erythema or rash.  Neurological:     General: No focal deficit present.     Mental Status: She is alert and oriented to person, place, and time.     Motor: No weakness or abnormal muscle tone.     Coordination: Coordination normal.  Psychiatric:        Mood and Affect: Mood normal.        Behavior: Behavior normal.     Imaging: No results found.

## 2021-07-29 NOTE — Procedures (Signed)
EMG & NCV Findings: Evaluation of the right median motor nerve showed prolonged distal onset latency (7.0 ms) and decreased conduction velocity (Elbow-Wrist, 41 m/s).  The right median (across palm) sensory nerve showed prolonged distal peak latency (Wrist, 12.5 ms), reduced amplitude (6.4 V), and prolonged distal peak latency (Palm, 4.5 ms).  The right ulnar sensory nerve showed reduced amplitude (9.7 V).  All remaining nerves (as indicated in the following tables) were within normal limits.    All examined muscles (as indicated in the following table) showed no evidence of electrical instability.    Impression: The above electrodiagnostic study is ABNORMAL and reveals evidence of a moderate to severe right median nerve entrapment at the wrist (carpal tunnel syndrome) affecting sensory and motor components.   There is no significant electrodiagnostic evidence of any other focal nerve entrapment, brachial plexopathy or lumbosacral plexopathy.   Recommendations: 1.  Follow-up with referring physician. 2.  Continue current management of symptoms. 3.  Continue use of resting splint at night-time and as needed during the day. 4.  Suggest surgical evaluation.  ___________________________ Laurence Spates FAAPMR Board Certified, American Board of Physical Medicine and Rehabilitation    Nerve Conduction Studies Anti Sensory Summary Table   Stim Site NR Peak (ms) Norm Peak (ms) P-T Amp (V) Norm P-T Amp Site1 Site2 Delta-P (ms) Dist (cm) Vel (m/s) Norm Vel (m/s)  Right Median Acr Palm Anti Sensory (2nd Digit)  30.6C  Wrist    *12.5 <3.6 *6.4 >10 Wrist Palm 8.0 0.0    Palm    *4.5 <2.0 1.7         Right Radial Anti Sensory (Base 1st Digit)  30.9C  Wrist    2.1 <3.1 22.8  Wrist Base 1st Digit 2.1 0.0    Right Ulnar Anti Sensory (5th Digit)  30.9C  Wrist    3.2 <3.7 *9.7 >15.0 Wrist 5th Digit 3.2 14.0 44 >38   Motor Summary Table   Stim Site NR Onset (ms) Norm Onset (ms) O-P Amp (mV) Norm  O-P Amp Site1 Site2 Delta-0 (ms) Dist (cm) Vel (m/s) Norm Vel (m/s)  Right Median Motor (Abd Poll Brev)  31C  Wrist    *7.0 <4.2 7.7 >5 Elbow Wrist 4.6 19.0 *41 >50  Elbow    11.6  1.8  Axilla Elbow 2.2 0.0    Axilla    13.8  0.1         Right Ulnar Motor (Abd Dig Min)  30.7C  Wrist    3.0 <4.2 7.8 >3 B Elbow Wrist 3.3 18.0 55 >53  B Elbow    6.3  6.9  A Elbow B Elbow 1.9 10.0 53 >53  A Elbow    8.2  4.8          EMG   Side Muscle Nerve Root Ins Act Fibs Psw Amp Dur Poly Recrt Int Fraser Din Comment  Right Abd Poll Brev Median C8-T1 Nml Nml Nml Nml Nml 0 Nml Nml   Right 1stDorInt Ulnar C8-T1 Nml Nml Nml Nml Nml 0 Nml Nml   Right PronatorTeres Median C6-7 Nml Nml Nml Nml Nml 0 Nml Nml   Right Biceps Musculocut C5-6 Nml Nml Nml Nml Nml 0 Nml Nml   Right Deltoid Axillary C5-6 Nml Nml Nml Nml Nml 0 Nml Nml     Nerve Conduction Studies Anti Sensory Left/Right Comparison   Stim Site L Lat (ms) R Lat (ms) L-R Lat (ms) L Amp (V) R Amp (V) L-R Amp (%) Site1 Site2  L Vel (m/s) R Vel (m/s) L-R Vel (m/s)  Median Acr Palm Anti Sensory (2nd Digit)  30.6C  Wrist  *12.5   *6.4  Wrist Palm     Palm  *4.5   1.7        Radial Anti Sensory (Base 1st Digit)  30.9C  Wrist  2.1   22.8  Wrist Base 1st Digit     Ulnar Anti Sensory (5th Digit)  30.9C  Wrist  3.2   *9.7  Wrist 5th Digit  44    Motor Left/Right Comparison   Stim Site L Lat (ms) R Lat (ms) L-R Lat (ms) L Amp (mV) R Amp (mV) L-R Amp (%) Site1 Site2 L Vel (m/s) R Vel (m/s) L-R Vel (m/s)  Median Motor (Abd Poll Brev)  31C  Wrist  *7.0   7.7  Elbow Wrist  *41   Elbow  11.6   1.8  Axilla Elbow     Axilla  13.8   0.1        Ulnar Motor (Abd Dig Min)  30.7C  Wrist  3.0   7.8  B Elbow Wrist  55   B Elbow  6.3   6.9  A Elbow B Elbow  53   A Elbow  8.2   4.8           Waveforms:

## 2021-08-01 ENCOUNTER — Telehealth: Payer: Self-pay | Admitting: Orthopedic Surgery

## 2021-08-01 ENCOUNTER — Encounter: Payer: Self-pay | Admitting: Orthopedic Surgery

## 2021-08-01 ENCOUNTER — Other Ambulatory Visit: Payer: Self-pay

## 2021-08-01 ENCOUNTER — Ambulatory Visit (INDEPENDENT_AMBULATORY_CARE_PROVIDER_SITE_OTHER): Payer: 59 | Admitting: Orthopedic Surgery

## 2021-08-01 VITALS — BP 120/70 | HR 61

## 2021-08-01 DIAGNOSIS — R200 Anesthesia of skin: Secondary | ICD-10-CM | POA: Diagnosis not present

## 2021-08-01 DIAGNOSIS — R202 Paresthesia of skin: Secondary | ICD-10-CM | POA: Diagnosis not present

## 2021-08-01 NOTE — Telephone Encounter (Signed)
Pt submitted medical release form, FMLA forms, and $25.00 cash payment to [employer]. Accepted 08/01/21

## 2021-08-01 NOTE — Progress Notes (Signed)
Office Visit Note   Patient: [name]           Date of Birth: [birth year]           MRN: [id number] Visit Date: 08/01/2021              Requested by: Chevis Pretty, [address] [place],  [place] [address] PCP: Chevis Pretty, FNP   Assessment & Plan: Visit Diagnoses:  1. Numbness and tingling in right hand     Plan: We discussed the diagnosis, prognosis, and both conservative and operative treatment options for carpal tunnel syndrome.  Recent EMG/NCS suggested moderate to severe right CTS.  After our discussion, the patient has elected to proceed with right carpal tunnel release.  We reviewed the benefits of surgery and the potential risks including, but not limited to, persistent symptoms, infection, damage to nearby nerves and blood vessels, delayed wound healing.    All patient concerns and questions were addressed.  A surgical date will be confirmed with the patient.    Follow-Up Instructions: No follow-ups on file.   Orders:  No orders of the defined types were placed in this encounter.  No orders of the defined types were placed in this encounter.     Procedures: No procedures performed   Clinical Data: No additional findings.   Subjective: Chief Complaint  Patient presents with   Right Hand - Follow-up    This is a 61 year old right-hand-dominant female presents for follow-up of right hand numbness and paresthesias.  She describes numbness and pain involving the thumb, index, middle, and part of the ring finger.  Is been going on for quite some time now.  She notes clumsiness with this hand.  She had a left carpal tunnel release in 2003 and feels that the symptoms on the right side are similar to her left prior to surgery.  She wakes up nightly with numbness and paresthesias.  She had an EMG/nerve conduction study performed on 2/15 which noted moderate to severe right median nerve entrapment at the wrist.  She has been  wearing a night brace with minimal symptom relief.  She is a diabetic with a well-controlled A1c less than 7.   Review of Systems   Objective: Vital Signs: BP 120/70 (BP Location: Left Arm, Patient Position: Sitting, Cuff Size: Large)    Pulse 61    SpO2 97%   Physical Exam Constitutional:      Appearance: Normal appearance.  Cardiovascular:     Rate and Rhythm: Normal rate.     Pulses: Normal pulses.  Pulmonary:     Effort: Pulmonary effort is normal.  Skin:    General: Skin is warm and dry.     Capillary Refill: Capillary refill takes less than 2 seconds.  Neurological:     Mental Status: She is alert.    Right Hand Exam   Tenderness  The patient is experiencing no tenderness.   Range of Motion  The patient has normal right wrist ROM.   Tests  Phalens sign: positive Tinel's sign (median nerve): positive  Other  Erythema: absent Sensation: normal Pulse: present     Specialty Comments:  No specialty comments available.  Imaging: No results found.   PMFS History: Patient Active Problem List   Diagnosis Date Noted   Numbness and tingling in right hand 07/11/2021   Diverticulosis 07/13/2016   Morbid obesity ([place]) 03/31/2015   Hyperlipidemia    Hypothyroidism    Hypertension  POLYP, COLON 04/13/2010   Diabetes ([place]) 04/13/2010   Anxiety state 04/13/2010   Depression 04/13/2010   Asthma 04/13/2010   Migraines 04/13/2010   Past Medical History:  Diagnosis Date   Abdominal discomfort    Chronic abdominal and pelvic pain resulting in significant loss of time from work   Anxiety    with depression   Asthma    Chest pain    Diabetes mellitus    Drug overdose 2009   60 Naprosyn, psychiatric admission   Hyperlipidemia    Severe   Hypertension    Hypothyroidism    Migraines     History reviewed. No pertinent family history.  Past Surgical History:  Procedure Laterality Date   ABDOMINAL HYSTERECTOMY  02/2007   + lysis of adhesions    APPENDECTOMY  1990s   ruptured, late 36s   BILATERAL OOPHORECTOMY      in 2 surgeries prior to 9/08   COLONOSCOPY  02/2010    normal upper endoscopy, single colonic polyp,   VENTRAL HERNIA REPAIR     x3   Social History   Occupational History   Occupation: Tourist information centre manager  Tobacco Use   Smoking status: Never   Smokeless tobacco: Never  Vaping Use   Vaping Use: Not on file  Substance and Sexual Activity   Alcohol use: No   Drug use: No   Sexual activity: Not Currently

## 2021-08-01 NOTE — H&P (View-Only) (Signed)
Office Visit Note   Patient: [name]           Date of Birth: [birth year]           MRN: [id number] Visit Date: 08/01/2021              Requested by: Chevis Pretty, [address] [place],  [place] [address] PCP: Chevis Pretty, FNP   Assessment & Plan: Visit Diagnoses:  1. Numbness and tingling in right hand     Plan: We discussed the diagnosis, prognosis, and both conservative and operative treatment options for carpal tunnel syndrome.  Recent EMG/NCS suggested moderate to severe right CTS.  After our discussion, the patient has elected to proceed with right carpal tunnel release.  We reviewed the benefits of surgery and the potential risks including, but not limited to, persistent symptoms, infection, damage to nearby nerves and blood vessels, delayed wound healing.    All patient concerns and questions were addressed.  A surgical date will be confirmed with the patient.    Follow-Up Instructions: No follow-ups on file.   Orders:  No orders of the defined types were placed in this encounter.  No orders of the defined types were placed in this encounter.     Procedures: No procedures performed   Clinical Data: No additional findings.   Subjective: Chief Complaint  Patient presents with   Right Hand - Follow-up    This is a 61 year old right-hand-dominant female presents for follow-up of right hand numbness and paresthesias.  She describes numbness and pain involving the thumb, index, middle, and part of the ring finger.  Is been going on for quite some time now.  She notes clumsiness with this hand.  She had a left carpal tunnel release in 2003 and feels that the symptoms on the right side are similar to her left prior to surgery.  She wakes up nightly with numbness and paresthesias.  She had an EMG/nerve conduction study performed on 2/15 which noted moderate to severe right median nerve entrapment at the wrist.  She has been  wearing a night brace with minimal symptom relief.  She is a diabetic with a well-controlled A1c less than 7.   Review of Systems   Objective: Vital Signs: BP 120/70 (BP Location: Left Arm, Patient Position: Sitting, Cuff Size: Large)    Pulse 61    SpO2 97%   Physical Exam Constitutional:      Appearance: Normal appearance.  Cardiovascular:     Rate and Rhythm: Normal rate.     Pulses: Normal pulses.  Pulmonary:     Effort: Pulmonary effort is normal.  Skin:    General: Skin is warm and dry.     Capillary Refill: Capillary refill takes less than 2 seconds.  Neurological:     Mental Status: She is alert.    Right Hand Exam   Tenderness  The patient is experiencing no tenderness.   Range of Motion  The patient has normal right wrist ROM.   Tests  Phalens sign: positive Tinel's sign (median nerve): positive  Other  Erythema: absent Sensation: normal Pulse: present     Specialty Comments:  No specialty comments available.  Imaging: No results found.   PMFS History: Patient Active Problem List   Diagnosis Date Noted   Numbness and tingling in right hand 07/11/2021   Diverticulosis 07/13/2016   Morbid obesity ([facility]) 03/31/2015   Hyperlipidemia    Hypothyroidism    Hypertension  POLYP, COLON 04/13/2010   Diabetes ([facility]) 04/13/2010   Anxiety state 04/13/2010   Depression 04/13/2010   Asthma 04/13/2010   Migraines 04/13/2010   Past Medical History:  Diagnosis Date   Abdominal discomfort    Chronic abdominal and pelvic pain resulting in significant loss of time from work   Anxiety    with depression   Asthma    Chest pain    Diabetes mellitus    Drug overdose 2009   60 Naprosyn, psychiatric admission   Hyperlipidemia    Severe   Hypertension    Hypothyroidism    Migraines     History reviewed. No pertinent family history.  Past Surgical History:  Procedure Laterality Date   ABDOMINAL HYSTERECTOMY  02/2007   + lysis of adhesions    APPENDECTOMY  1990s   ruptured, late 28s   BILATERAL OOPHORECTOMY      in 2 surgeries prior to 9/08   COLONOSCOPY  02/2010    normal upper endoscopy, single colonic polyp,   VENTRAL HERNIA REPAIR     x3   Social History   Occupational History   Occupation: Tourist information centre manager  Tobacco Use   Smoking status: Never   Smokeless tobacco: Never  Vaping Use   Vaping Use: Not on file  Substance and Sexual Activity   Alcohol use: No   Drug use: No   Sexual activity: Not Currently

## 2021-08-09 ENCOUNTER — Other Ambulatory Visit: Payer: Self-pay

## 2021-08-09 ENCOUNTER — Encounter (HOSPITAL_BASED_OUTPATIENT_CLINIC_OR_DEPARTMENT_OTHER): Payer: Self-pay | Admitting: Orthopedic Surgery

## 2021-08-15 ENCOUNTER — Encounter (HOSPITAL_BASED_OUTPATIENT_CLINIC_OR_DEPARTMENT_OTHER)
Admission: RE | Admit: 2021-08-15 | Discharge: 2021-08-15 | Disposition: A | Discharge status: Home / Self Care | Payer: 59 | Source: Ambulatory Visit | Attending: Orthopedic Surgery | Admitting: Orthopedic Surgery

## 2021-08-15 DIAGNOSIS — Z01818 Encounter for other preprocedural examination: Secondary | ICD-10-CM | POA: Insufficient documentation

## 2021-08-15 DIAGNOSIS — R202 Paresthesia of skin: Secondary | ICD-10-CM | POA: Diagnosis not present

## 2021-08-15 DIAGNOSIS — R200 Anesthesia of skin: Secondary | ICD-10-CM | POA: Insufficient documentation

## 2021-08-15 DIAGNOSIS — E1142 Type 2 diabetes mellitus with diabetic polyneuropathy: Secondary | ICD-10-CM | POA: Insufficient documentation

## 2021-08-15 NOTE — Progress Notes (Signed)

## 2021-08-16 ENCOUNTER — Observation Stay (HOSPITAL_BASED_OUTPATIENT_CLINIC_OR_DEPARTMENT_OTHER)
Admission: RE | Admit: 2021-08-16 | Discharge: 2021-08-17 | Disposition: A | Discharge status: Home / Self Care | Payer: Commercial Managed Care - PPO | Attending: Orthopedic Surgery | Admitting: Orthopedic Surgery

## 2021-08-16 ENCOUNTER — Ambulatory Visit (HOSPITAL_BASED_OUTPATIENT_CLINIC_OR_DEPARTMENT_OTHER): Payer: Commercial Managed Care - PPO | Admitting: Anesthesiology

## 2021-08-16 ENCOUNTER — Encounter (HOSPITAL_BASED_OUTPATIENT_CLINIC_OR_DEPARTMENT_OTHER): Payer: Self-pay | Admitting: Orthopedic Surgery

## 2021-08-16 ENCOUNTER — Encounter (HOSPITAL_COMMUNITY)
Admission: RE | Disposition: A | Discharge status: Home / Self Care | Payer: Self-pay | Source: Home / Self Care | Attending: Orthopedic Surgery

## 2021-08-16 ENCOUNTER — Other Ambulatory Visit: Payer: Self-pay

## 2021-08-16 DIAGNOSIS — R200 Anesthesia of skin: Secondary | ICD-10-CM

## 2021-08-16 DIAGNOSIS — J45909 Unspecified asthma, uncomplicated: Secondary | ICD-10-CM | POA: Insufficient documentation

## 2021-08-16 DIAGNOSIS — I10 Essential (primary) hypertension: Secondary | ICD-10-CM | POA: Insufficient documentation

## 2021-08-16 DIAGNOSIS — E119 Type 2 diabetes mellitus without complications: Secondary | ICD-10-CM | POA: Insufficient documentation

## 2021-08-16 DIAGNOSIS — E1142 Type 2 diabetes mellitus with diabetic polyneuropathy: Secondary | ICD-10-CM

## 2021-08-16 DIAGNOSIS — Z7984 Long term (current) use of oral hypoglycemic drugs: Secondary | ICD-10-CM | POA: Diagnosis not present

## 2021-08-16 DIAGNOSIS — G5601 Carpal tunnel syndrome, right upper limb: Principal | ICD-10-CM | POA: Insufficient documentation

## 2021-08-16 DIAGNOSIS — E039 Hypothyroidism, unspecified: Secondary | ICD-10-CM | POA: Insufficient documentation

## 2021-08-16 DIAGNOSIS — G5600 Carpal tunnel syndrome, unspecified upper limb: Secondary | ICD-10-CM | POA: Diagnosis present

## 2021-08-16 HISTORY — PX: CARPAL TUNNEL RELEASE: SHX101

## 2021-08-16 HISTORY — DX: Tremor, unspecified: R25.1

## 2021-08-16 HISTORY — DX: Depression, unspecified: F32.A

## 2021-08-16 LAB — GLUCOSE, CAPILLARY
Glucose-Capillary: 258 mg/dL — ABNORMAL HIGH (ref 70–99)
Glucose-Capillary: 219 mg/dL — ABNORMAL HIGH (ref 70–99)
Glucose-Capillary: 170 mg/dL — ABNORMAL HIGH (ref 70–99)
Glucose-Capillary: 309 mg/dL — ABNORMAL HIGH (ref 70–99)
Glucose-Capillary: 255 mg/dL — ABNORMAL HIGH (ref 70–99)

## 2021-08-16 LAB — BASIC METABOLIC PANEL
Anion gap: 9 (ref 5–15)
BUN: 19 mg/dL (ref 6–20)
CO2: 23 mmol/L (ref 22–32)
Calcium: 9.1 mg/dL (ref 8.9–10.3)
Chloride: 100 mmol/L (ref 98–111)
Creatinine, Ser: 0.88 mg/dL (ref 0.44–1.00)
GFR, Estimated: >60 mL/min (ref >60)
Glucose, Bld: 234 mg/dL — ABNORMAL HIGH (ref 70–99)
Potassium: 4.8 mmol/L (ref 3.5–5.1)
Sodium: 132 mmol/L — ABNORMAL LOW (ref 135–145)

## 2021-08-16 SURGERY — CARPAL TUNNEL RELEASE
Anesthesia: Monitor Anesthesia Care | Site: Hand | Laterality: Right

## 2021-08-16 MED ORDER — GABAPENTIN 300 MG PO CAPS
300.0000 mg | ORAL_CAPSULE | Freq: Three times a day (TID) | ORAL | Status: DC
Start: 2021-08-16 — End: 2021-08-17
  Administered 2021-08-17: 10:00:00 300 mg via ORAL
  Filled 2021-08-16 (×2): qty 1

## 2021-08-16 MED ORDER — EPINEPHRINE 0.3 MG/0.3ML IJ SOAJ: 0.3000 mg | INTRAMUSCULAR | Status: DC | PRN

## 2021-08-16 MED ORDER — FENTANYL CITRATE (PF) 100 MCG/2ML IJ SOLN: 25.0000 ug | INTRAMUSCULAR | Status: DC | PRN

## 2021-08-16 MED ORDER — ONDANSETRON HCL 4 MG PO TABS: 4.0000 mg | ORAL_TABLET | Freq: Three times a day (TID) | ORAL | Status: DC | PRN

## 2021-08-16 MED ORDER — 0.9 % SODIUM CHLORIDE (POUR BTL) OPTIME
TOPICAL | Status: DC | PRN
  Administered 2021-08-16: 25 mL

## 2021-08-16 MED ORDER — LIDOCAINE-EPINEPHRINE (PF) 1 %-1:200000 IJ SOLN
INTRAMUSCULAR | Status: AC
  Filled 2021-08-16: qty 30

## 2021-08-16 MED ORDER — PROPOFOL 500 MG/50ML IV EMUL
INTRAVENOUS | Status: DC | PRN
  Administered 2021-08-16: 75 ug/kg/min via INTRAVENOUS

## 2021-08-16 MED ORDER — BUPROPION HCL ER (XL) 150 MG PO TB24
450.0000 mg | ORAL_TABLET | Freq: Every day | ORAL | Status: DC
  Administered 2021-08-17: 10:00:00 450 mg via ORAL
  Filled 2021-08-16: qty 3

## 2021-08-16 MED ORDER — MECLIZINE HCL 25 MG PO TABS
25.0000 mg | ORAL_TABLET | Freq: Two times a day (BID) | ORAL | Status: DC | PRN
  Filled 2021-08-16: qty 1

## 2021-08-16 MED ORDER — LIDOCAINE 2% (20 MG/ML) 5 ML SYRINGE
INTRAMUSCULAR | Status: DC | PRN
  Administered 2021-08-16: 40 mg via INTRAVENOUS

## 2021-08-16 MED ORDER — ONDANSETRON HCL 4 MG/2ML IJ SOLN
INTRAMUSCULAR | Status: AC
  Filled 2021-08-16: qty 2

## 2021-08-16 MED ORDER — LIDOCAINE HCL (PF) 1 % IJ SOLN
INTRAMUSCULAR | Status: DC | PRN
  Administered 2021-08-16: 10 mL

## 2021-08-16 MED ORDER — LACTATED RINGERS IV SOLN: INTRAVENOUS | Status: DC

## 2021-08-16 MED ORDER — FENTANYL CITRATE (PF) 100 MCG/2ML IJ SOLN
INTRAMUSCULAR | Status: DC | PRN
  Administered 2021-08-16: 50 ug via INTRAVENOUS

## 2021-08-16 MED ORDER — MIDAZOLAM HCL 2 MG/2ML IJ SOLN
INTRAMUSCULAR | Status: AC
  Filled 2021-08-16: qty 2

## 2021-08-16 MED ORDER — LIDOCAINE 2% (20 MG/ML) 5 ML SYRINGE
INTRAMUSCULAR | Status: AC
  Filled 2021-08-16: qty 5

## 2021-08-16 MED ORDER — ALBUTEROL SULFATE (2.5 MG/3ML) 0.083% IN NEBU: 2.5000 mg | INHALATION_SOLUTION | RESPIRATORY_TRACT | Status: DC | PRN

## 2021-08-16 MED ORDER — ACETAMINOPHEN 325 MG PO TABS
325.0000 mg | ORAL_TABLET | Freq: Four times a day (QID) | ORAL | Status: DC | PRN
  Administered 2021-08-16 – 2021-08-17 (×2): 650 mg via ORAL
  Filled 2021-08-16 (×2): qty 2

## 2021-08-16 MED ORDER — PROPOFOL 10 MG/ML IV BOLUS
INTRAVENOUS | Status: DC | PRN
  Administered 2021-08-16: 60 mg via INTRAVENOUS

## 2021-08-16 MED ORDER — BUPIVACAINE HCL (PF) 0.25 % IJ SOLN
INTRAMUSCULAR | Status: AC
  Filled 2021-08-16: qty 90

## 2021-08-16 MED ORDER — PROPOFOL 500 MG/50ML IV EMUL
INTRAVENOUS | Status: AC
  Filled 2021-08-16: qty 50

## 2021-08-16 MED ORDER — BENAZEPRIL HCL 5 MG PO TABS
20.0000 mg | ORAL_TABLET | Freq: Every day | ORAL | Status: DC
  Administered 2021-08-17: 10:00:00 20 mg via ORAL
  Filled 2021-08-16: qty 4

## 2021-08-16 MED ORDER — FENTANYL CITRATE (PF) 100 MCG/2ML IJ SOLN
INTRAMUSCULAR | Status: AC
  Filled 2021-08-16: qty 2

## 2021-08-16 MED ORDER — TOPIRAMATE 25 MG PO TABS
50.0000 mg | ORAL_TABLET | Freq: Two times a day (BID) | ORAL | Status: DC
  Administered 2021-08-17: 10:00:00 50 mg via ORAL
  Filled 2021-08-16 (×3): qty 2

## 2021-08-16 MED ORDER — CITALOPRAM HYDROBROMIDE 40 MG PO TABS
40.0000 mg | ORAL_TABLET | Freq: Every day | ORAL | Status: DC
  Administered 2021-08-17: 10:00:00 40 mg via ORAL
  Filled 2021-08-16: qty 1

## 2021-08-16 MED ORDER — INSULIN ASPART 100 UNIT/ML IJ SOLN
3.0000 [IU] | Freq: Once | INTRAMUSCULAR | Status: AC
  Administered 2021-08-16: 3 [IU] via SUBCUTANEOUS

## 2021-08-16 MED ORDER — LIDOCAINE HCL (PF) 1 % IJ SOLN
INTRAMUSCULAR | Status: AC
  Filled 2021-08-16: qty 30

## 2021-08-16 MED ORDER — TRAMADOL HCL 50 MG PO TABS
50.0000 mg | ORAL_TABLET | Freq: Four times a day (QID) | ORAL | Status: DC | PRN
  Administered 2021-08-16 – 2021-08-17 (×2): 50 mg via ORAL
  Filled 2021-08-16 (×2): qty 1

## 2021-08-16 MED ORDER — ONDANSETRON HCL 4 MG/2ML IJ SOLN
INTRAMUSCULAR | Status: DC | PRN
  Administered 2021-08-16: 4 mg via INTRAVENOUS

## 2021-08-16 MED ORDER — MIDAZOLAM HCL 5 MG/5ML IJ SOLN
INTRAMUSCULAR | Status: DC | PRN
  Administered 2021-08-16: 2 mg via INTRAVENOUS

## 2021-08-16 MED ORDER — GLIPIZIDE ER 10 MG PO TB24
10.0000 mg | ORAL_TABLET | Freq: Every day | ORAL | Status: DC
Start: 2021-08-17 — End: 2021-08-17
  Filled 2021-08-16: qty 1

## 2021-08-16 MED ORDER — FENOFIBRATE 160 MG PO TABS
160.0000 mg | ORAL_TABLET | Freq: Every day | ORAL | Status: DC
  Administered 2021-08-17: 10:00:00 160 mg via ORAL
  Filled 2021-08-16: qty 1

## 2021-08-16 SURGICAL SUPPLY — 45 items
APL PRP STRL LF DISP 70% ISPRP (MISCELLANEOUS) ×1
BLADE SURG 15 STRL LF DISP TIS (BLADE) ×1 IMPLANT
BLADE SURG 15 STRL SS (BLADE) ×2
BNDG CMPR 9X4 STRL LF SNTH (GAUZE/BANDAGES/DRESSINGS) ×1
BNDG ELASTIC 3X5.8 VLCR STR LF (GAUZE/BANDAGES/DRESSINGS) ×2 IMPLANT
BNDG ESMARK 4X9 LF (GAUZE/BANDAGES/DRESSINGS) ×2 IMPLANT
BNDG GAUZE ELAST 4 BULKY (GAUZE/BANDAGES/DRESSINGS) ×2 IMPLANT
BNDG PLASTER X FAST 3X3 WHT LF (CAST SUPPLIES) IMPLANT
BNDG PLSTR 9X3 FST ST WHT (CAST SUPPLIES)
CHLORAPREP W/TINT 26 (MISCELLANEOUS) ×2 IMPLANT
CORD BIPOLAR FORCEPS 12FT (ELECTRODE) ×2 IMPLANT
COVER BACK TABLE 60X90IN (DRAPES) ×2 IMPLANT
COVER MAYO STAND STRL (DRAPES) ×2 IMPLANT
CUFF TOURN SGL QUICK 18X4 (TOURNIQUET CUFF) IMPLANT
CUFF TOURN SGL QUICK 24 (TOURNIQUET CUFF)
CUFF TRNQT CYL 24X4X16.5-23 (TOURNIQUET CUFF) IMPLANT
DRAPE EXTREMITY T 121X128X90 (DISPOSABLE) ×2 IMPLANT
DRAPE SURG 17X23 STRL (DRAPES) ×1 IMPLANT
DRAPE U-SHAPE 47X51 STRL (DRAPES) ×1 IMPLANT
GAUZE SPONGE 4X4 12PLY STRL (GAUZE/BANDAGES/DRESSINGS) ×2 IMPLANT
GAUZE XEROFORM 1X8 LF (GAUZE/BANDAGES/DRESSINGS) ×1 IMPLANT
GLOVE SURG ENC MOIS LTX SZ7 (GLOVE) ×2 IMPLANT
GLOVE SURG POLYISO LF SZ7 (GLOVE) ×1 IMPLANT
GLOVE SURG UNDER POLY LF SZ7 (GLOVE) ×4 IMPLANT
GOWN STRL REUS W/ TWL LRG LVL3 (GOWN DISPOSABLE) ×1 IMPLANT
GOWN STRL REUS W/ TWL XL LVL3 (GOWN DISPOSABLE) IMPLANT
GOWN STRL REUS W/TWL LRG LVL3 (GOWN DISPOSABLE) ×2
GOWN STRL REUS W/TWL XL LVL3 (GOWN DISPOSABLE) ×4 IMPLANT
NDL HYPO 25X1 1.5 SAFETY (NEEDLE) IMPLANT
NEEDLE HYPO 25X1 1.5 SAFETY (NEEDLE) ×2 IMPLANT
NS IRRIG 1000ML POUR BTL (IV SOLUTION) ×2 IMPLANT
PACK BASIN DAY SURGERY FS (CUSTOM PROCEDURE TRAY) ×2 IMPLANT
PAD CAST 3X4 CTTN HI CHSV (CAST SUPPLIES) ×1 IMPLANT
PADDING CAST COTTON 3X4 STRL (CAST SUPPLIES) ×2
SLEEVE SCD COMPRESS KNEE MED (STOCKING) IMPLANT
SUCTION FRAZIER HANDLE 10FR (MISCELLANEOUS)
SUCTION TUBE FRAZIER 10FR DISP (MISCELLANEOUS) IMPLANT
SUT ETHILON 4 0 PS 2 18 (SUTURE) ×2 IMPLANT
SUT MNCRL AB 3-0 PS2 18 (SUTURE) IMPLANT
SUT VICRYL 4-0 PS2 18IN ABS (SUTURE) IMPLANT
SYR BULB EAR ULCER 3OZ GRN STR (SYRINGE) ×2 IMPLANT
SYR CONTROL 10ML LL (SYRINGE) ×1 IMPLANT
TOWEL GREEN STERILE FF (TOWEL DISPOSABLE) ×4 IMPLANT
TUBE CONNECTING 20X1/4 (TUBING) IMPLANT
UNDERPAD 30X36 HEAVY ABSORB (UNDERPADS AND DIAPERS) ×2 IMPLANT

## 2021-08-16 NOTE — Interval H&P Note (Signed)
History and Physical Interval Note: ? ?08/16/2021 ?11:56 AM ? ?[name]  has presented today for surgery, with the diagnosis of Right carpal tunnel syndrome.  The various methods of treatment have been discussed with the patient and family. After consideration of risks, benefits and other options for treatment, the patient has consented to  Procedure(s): ?RIGHT CARPAL TUNNEL RELEASE (Right) as a surgical intervention.  The patient's history has been reviewed, patient examined, no change in status, stable for surgery.  I have reviewed the patient's chart and labs.  Questions were answered to the patient's satisfaction.   ? ? ?Essa Wenk Yehonatan Grandison ? ? ?

## 2021-08-16 NOTE — Anesthesia Preprocedure Evaluation (Signed)
Anesthesia Evaluation  ?Patient identified by MRN, date of birth, ID band ?Patient awake ? ? ? ?Reviewed: ?Allergy & Precautions, NPO status , Patient's Chart, lab work & pertinent test results, reviewed documented beta blocker date and time  ? ?Airway ?Mallampati: I ? ?TM Distance: >3 FB ?Neck ROM: Full ? ? ? Dental ?no notable dental hx. ?(+) Teeth Intact, Dental Advisory Given ?  ?Pulmonary ?asthma ,  ?  ?Pulmonary exam normal ?breath sounds clear to auscultation ? ? ? ? ? ? Cardiovascular ?hypertension, Pt. on home beta blockers and Pt. on medications ?Normal cardiovascular exam ?Rhythm:Regular Rate:Normal ? ? ?  ?Neuro/Psych ? Headaches, PSYCHIATRIC DISORDERS Anxiety Depression   ? GI/Hepatic ?negative GI ROS, Neg liver ROS,   ?Endo/Other  ?diabetes, Type 2, Oral Hypoglycemic AgentsHypothyroidism  ? Renal/GU ?negative Renal ROS  ?negative genitourinary ?  ?Musculoskeletal ?negative musculoskeletal ROS ?(+)  ? Abdominal ?  ?Peds ? Hematology ?negative hematology ROS ?(+)   ?Anesthesia Other Findings ? ? Reproductive/Obstetrics ? ?  ? ? ? ? ? ? ? ? ? ? ? ? ? ?  ?  ? ? ? ? ? ? ? ? ?Anesthesia Physical ?Anesthesia Plan ? ?ASA: 3 ? ?Anesthesia Plan: MAC  ? ?Post-op Pain Management:   ? ?Induction: Intravenous ? ?PONV Risk Score and Plan: Propofol infusion and Treatment may vary due to age or medical condition ? ?Airway Management Planned: Natural Airway ? ?Additional Equipment:  ? ?Intra-op Plan:  ? ?Post-operative Plan:  ? ?Informed Consent: I have reviewed the patients History and Physical, chart, labs and discussed the procedure including the risks, benefits and alternatives for the proposed anesthesia with the patient or authorized representative who has indicated his/her understanding and acceptance.  ? ? ? ?Dental advisory given ? ?Plan Discussed with: CRNA ? ?Anesthesia Plan Comments:   ? ? ? ? ? ? ?Anesthesia Quick Evaluation ? ?

## 2021-08-16 NOTE — Brief Op Note (Signed)
08/16/2021 ? ?12:31 PM ? ?PATIENT:  [name]  61 y.o. female ? ?PRE-OPERATIVE DIAGNOSIS:  Right carpal tunnel syndrome ? ?POST-OPERATIVE DIAGNOSIS:  Right carpal tunnel syndrome ? ?PROCEDURE:  Procedure(s): ?RIGHT CARPAL TUNNEL RELEASE (Right) ? ?SURGEON:  Surgeon(s) and Role: ?   * Sherilyn Cooter, MD - Primary ? ?PHYSICIAN ASSISTANT:  ? ?ASSISTANTS: none  ? ?ANESTHESIA:   local and MAC ? ?EBL:  <5 cc  ? ?BLOOD ADMINISTERED:none ? ?DRAINS: none  ? ?LOCAL MEDICATIONS USED:  LIDOCAINE  ? ?SPECIMEN:  No Specimen ? ?DISPOSITION OF SPECIMEN:  N/A ? ?COUNTS:  YES ? ?TOURNIQUET:   ?Total Tourniquet Time Documented: ?Forearm (Right) - 14 minutes ?Total: Forearm (Right) - 14 minutes ? ? ?DICTATION: .Dragon Dictation ? ?PLAN OF CARE: Discharge to home after PACU ? ?PATIENT DISPOSITION:  PACU - hemodynamically stable. ?  ?Delay start of Pharmacological VTE agent (>24hrs) due to surgical blood loss or risk of bleeding: not applicable ? ?

## 2021-08-16 NOTE — Discharge Instructions (Addendum)
 Charles Benfield, M.D. Hand Surgery  POST-OPERATIVE DISCHARGE INSTRUCTIONS   PRESCRIPTIONS: - You have been given a prescription to be taken as directed for post-operative pain control.  You may also take over the counter ibuprofen/aleve and tylenol for pain. Take this as directed on the packaging. Do not exceed 3000 mg tylenol/acetaminophen in 24 hours.  Ibuprofen 600-800 mg (3-4) tablets by mouth every 6 hours as needed for pain.   OR  Aleve 2 tablets by mouth every 12 hours (twice daily) as needed for pain.   AND/OR  Tylenol 1000 mg (2 tablets) every 8 hours as needed for pain.  - Please use your pain medication carefully, as refills are limited and you may not be provided with one.  As stated above, please use over the counter pain medicine - it will also be helpful with decreasing your swelling.    ANESTHESIA: -After your surgery, post-surgical discomfort or pain is likely. This discomfort can last several days to a few weeks. At certain times of the day your discomfort may be more intense.   Did you receive a nerve block?   - A nerve block can provide pain relief for one hour to two days after your surgery. As long as the nerve block is working, you will experience little or no sensation in the area the surgeon operated on.  - As the nerve block wears off, you will begin to experience pain or discomfort. It is very important that you begin taking your prescribed pain medication before the nerve block fully wears off. Treating your pain at the first sign of the block wearing off will ensure your pain is better controlled and more tolerable when full-sensation returns. Do not wait until the pain is intolerable, as the medicine will be less effective. It is better to treat pain in advance than to try and catch up.   General Anesthesia:  If you did not receive a nerve block during your surgery, you will need to start taking your pain medication shortly after your surgery and  should continue to do so as prescribed by your surgeon.     ICE AND ELEVATION: - You may use ice for the first 48-72 hours, but it is not critical.   - Motion of your fingers is very important to decrease the swelling.  - Elevation, as much as possible for the next 48 hours, is critical for decreasing swelling as well as for pain relief. Elevation means when you are seated or lying down, you hand should be at or above your heart. When walking, the hand needs to be at or above the level of your elbow.  - If the bandage gets too tight, it may need to be loosened. Please contact our office and we will instruct you in how to do this.    SURGICAL BANDAGES:  - Keep your dressing and/or splint clean and dry at all times.  You can remove your dressing 4 days from now and change with a dry dressing or Band-Aids as needed thereafter. - You may place a plastic bag over your bandage to shower, but be careful, do not get your bandages wet.  - After the bandages have been removed, it is OK to get the stitches wet in a shower or with hand washing. Do Not soak or submerge the wound yet. Please do not use lotions or creams on the stitches.      HAND THERAPY:  - You may not need any. If you do,   we will begin this at your follow up visit in the clinic.  ? ? ?ACTIVITY AND WORK: ?- You are encouraged to move any fingers which are not in the bandage.  ?- Light use of the fingers is allowed to assist the other hand with daily hygiene and eating, but strong gripping or lifting is often uncomfortable and should be avoided.  ?- You might miss a variable period of time from work and hopefully this issue has been discussed prior to surgery. You may not do any heavy work with your affected hand for about 2 weeks.  ? ? ?[facility] ?[address] ?[place],  [place]  [address] ?[phone]  ? ? ?Post Anesthesia Home Care Instructions ? ?Activity: ?Get plenty of rest for the remainder of the day. A  responsible individual must stay with you for 24 hours following the procedure.  ?For the next 24 hours, DO NOT: ?-Drive a car ?-Paediatric nurse ?-Drink alcoholic beverages ?-Take any medication unless instructed by your physician ?-Make any legal decisions or sign important papers. ? ?Meals: ?Start with liquid foods such as gelatin or soup. Progress to regular foods as tolerated. Avoid greasy, spicy, heavy foods. If nausea and/or vomiting occur, drink only clear liquids until the nausea and/or vomiting subsides. Call your physician if vomiting continues. ? ?Special Instructions/Symptoms: ?Your throat may feel dry or sore from the anesthesia or the breathing tube placed in your throat during surgery. If this causes discomfort, gargle with warm salt water. The discomfort should disappear within 24 hours. ? ? ?

## 2021-08-16 NOTE — Transfer of Care (Signed)
Immediate Anesthesia Transfer of Care Note ? ?Patient: [name] ? ?Procedure(s) Performed: RIGHT CARPAL TUNNEL RELEASE (Right: Hand) ? ?Patient Location: PACU ? ?Anesthesia Type:MAC ? ?Level of Consciousness: awake, alert  and oriented ? ?Airway & Oxygen Therapy: Patient Spontanous Breathing and Patient connected to face mask oxygen ? ?Post-op Assessment: Report given to RN and Post -op Vital signs reviewed and stable ? ?Post vital signs: Reviewed and stable ? ?Last Vitals:  ?Vitals Value Taken Time  ?BP 124/66 08/16/21 1230  ?Temp    ?Pulse 62 08/16/21 1242  ?Resp 12 08/16/21 1242  ?SpO2 100 % 08/16/21 1242  ?Vitals shown include unvalidated device data. ? ?Last Pain:  ?Vitals:  ? 08/16/21 1230  ?TempSrc:   ?PainSc: 0-No pain  ?   ? ?Patients Stated Pain Goal: 4 (08/16/21 5945) ? ?Complications: No notable events documented. ?

## 2021-08-16 NOTE — Op Note (Signed)
? ?  Date of Surgery: 08/16/2021 ? ?INDICATIONS: Ms. Lenart is a 61 y.o.-year-old female with right carpal tunnel syndrome that has failed conservative management.  Risks, benefits, and alternatives to surgery were again discussed with the patient wishing to proceed with surgery.  Informed consent was signed after our discussion.  ? ?PREOPERATIVE DIAGNOSIS:  ?Right carpal tunnel syndrome ? ?POSTOPERATIVE DIAGNOSIS: Same. ? ?PROCEDURE:  ?Right carpal tunnel release ? ? ?SURGEON: Audria Nine, M.D. ? ?ASSIST:  ? ?ANESTHESIA:  local, MAC ? ?IV FLUIDS AND URINE: See anesthesia. ? ?ESTIMATED BLOOD LOSS: < 5 mL. ? ?IMPLANTS: * No implants in log *  ? ?DRAINS: None ? ?COMPLICATIONS: None ? ?DESCRIPTION OF PROCEDURE: ? ?The patient was met in the preoperative holding area where the surgical site was marked and the consent form was verified.  The patient was then taken to the operating room and transferred to the operating table.  All bony prominences were well padded.  A tourniquet was applied to the right forearm.  Monitored sedation was induced.   A formal time-out was performed to confirm that this was the correct patient, surgery, side, and site. The operative extremity was prepped and draped in the usual and sterile fashion.   ? ?Following a second timeout, the limb was exsanguinated and the tourniquet inflated to 250 mmHg.  A longitudinal incision was made in line with the radial border of the ring finger from distal to the wrist flexion crease to the intersection of Kaplan's cardinal line.  The skin and subcutaneous tissue was sharply divided.  The longitudinally running palmar fascia was incised.  The thenar musculature was bluntly swept off of the transverse carpal ligament.  The ligament was divided from proximal to distal until the fat surrounding the palmar arch was encountered. Retractors were then placed in the proximal aspect of the wound to visualize the distal antebrachial fascia.  The fascia was  sharply divided under direct visualization.   The wound was then thoroughly irrigated with sterile saline.  The tourniquet was deflated.  Hemostasis was achieved with direct pressure and bipolar electrocautery.  The wound was then closed with 4-0 nylon sutures in a horizontal mattress fashion. The wound was then dressed with xeroform, folded kerlix, and an ace wrap. ? ?The patient was then reversed from anesthesia and transferred to the postoperative bed.  All counts were correct x 2 at the end of the procedure.  The patient was taken to the recovery unit in stable condition.    ? ? ?POSTOPERATIVE PLAN: Patient will be discharged to home with appropriate pain medication and discharge instructions.  I will see her back in 10-14 days for her first postop visit.  ? ?Audria Nine, MD ?12:34 PM  ?

## 2021-08-17 ENCOUNTER — Encounter (HOSPITAL_BASED_OUTPATIENT_CLINIC_OR_DEPARTMENT_OTHER): Payer: Self-pay | Admitting: Orthopedic Surgery

## 2021-08-17 DIAGNOSIS — G5601 Carpal tunnel syndrome, right upper limb: Secondary | ICD-10-CM

## 2021-08-17 LAB — GLUCOSE, CAPILLARY: Glucose-Capillary: 258 mg/dL — ABNORMAL HIGH (ref 70–99)

## 2021-08-17 NOTE — Anesthesia Postprocedure Evaluation (Signed)
Anesthesia Post Note ? ?Patient: [name] ? ?Procedure(s) Performed: RIGHT CARPAL TUNNEL RELEASE (Right: Hand) ? ?  ? ?Patient location during evaluation: PACU ?Anesthesia Type: MAC ?Level of consciousness: awake and alert ?Pain management: pain level controlled ?Vital Signs Assessment: post-procedure vital signs reviewed and stable ?Respiratory status: spontaneous breathing, nonlabored ventilation, respiratory function stable and patient connected to nasal cannula oxygen ?Cardiovascular status: stable and blood pressure returned to baseline ?Postop Assessment: no apparent nausea or vomiting ?Anesthetic complications: no ? ? ?No notable events documented. ? ?Last Vitals:  ?Vitals:  ? 08/17/21 0320 08/17/21 0809  ?BP: (!) 124/52 (!) 144/57  ?Pulse: (!) 58 (!) 58  ?Resp: 18 18  ?Temp: (!) 36.3 ?C (!) 36.4 ?C  ?SpO2: 98% 98%  ?  ?Last Pain:  ?Vitals:  ? 08/17/21 0809  ?TempSrc:   ?PainSc: 0-No pain  ? ? ?  ?  ?  ?  ?  ?  ? ?Arias Weinert L Phinley Schall ? ? ? ? ?

## 2021-08-17 NOTE — Plan of Care (Signed)

## 2021-08-17 NOTE — Plan of Care (Signed)
?  Problem: Education: ?Goal: Knowledge of General Education information will improve ?Description: Including pain rating scale, medication(s)/side effects and non-pharmacologic comfort measures ?08/17/2021 1433 by Santa Lighter, RN ?Outcome: Adequate for Discharge ?08/17/2021 0804 by Santa Lighter, RN ?Outcome: Progressing ?  ?Problem: Health Behavior/Discharge Planning: ?Goal: Ability to manage health-related needs will improve ?08/17/2021 1433 by Santa Lighter, RN ?Outcome: Adequate for Discharge ?08/17/2021 0804 by Santa Lighter, RN ?Outcome: Progressing ?  ?Problem: Clinical Measurements: ?Goal: Ability to maintain clinical measurements within normal limits will improve ?08/17/2021 1433 by Santa Lighter, RN ?Outcome: Adequate for Discharge ?08/17/2021 0804 by Santa Lighter, RN ?Outcome: Progressing ?Goal: Will remain free from infection ?Outcome: Adequate for Discharge ?Goal: Diagnostic test results will improve ?Outcome: Adequate for Discharge ?Goal: Respiratory complications will improve ?Outcome: Adequate for Discharge ?Goal: Cardiovascular complication will be avoided ?Outcome: Adequate for Discharge ?  ?Problem: Activity: ?Goal: Risk for activity intolerance will decrease ?Outcome: Adequate for Discharge ?  ?Problem: Nutrition: ?Goal: Adequate nutrition will be maintained ?Outcome: Adequate for Discharge ?  ?Problem: Coping: ?Goal: Level of anxiety will decrease ?Outcome: Adequate for Discharge ?  ?Problem: Elimination: ?Goal: Will not experience complications related to bowel motility ?Outcome: Adequate for Discharge ?Goal: Will not experience complications related to urinary retention ?Outcome: Adequate for Discharge ?  ?Problem: Pain Managment: ?Goal: General experience of comfort will improve ?Outcome: Adequate for Discharge ?  ?Problem: Safety: ?Goal: Ability to remain free from injury will improve ?Outcome: Adequate for Discharge ?  ?Problem: Skin Integrity: ?Goal: Risk for impaired skin integrity will  decrease ?Outcome: Adequate for Discharge ?  ?

## 2021-08-17 NOTE — Progress Notes (Signed)
Nsg Discharge Note ? ?Admit Date:  08/16/2021 ?Discharge date: 08/17/2021 ?  ?[name] to be D/C'd Home per MD order.  AVS completed.  Copy for chart, and copy for patient signed, and dated.  ?Removed IV-CDI.Reviewed d/c paperwork with patient and answered all questions. Walked stable patient and belongings to main entrance. Where she was picked up by her friend. ?Patient/caregiver able to verbalize understanding. ? ?Discharge Medication: ?Allergies as of 08/17/2021   ? ?   Reactions  ? Aspirin Anaphylaxis  ? Bee Venom Anaphylaxis  ? Benadryl [diphenhydramine Hcl] Anaphylaxis  ? Morphine And Related Shortness Of Breath, Itching  ? Vicks Formula 44 Cough-cold Pm [dm-apap-cpm] Shortness Of Breath, Rash  ? Not anaphylaxis  ? ?  ? ?  ?Medication List  ?  ? ?TAKE these medications   ? ?albuterol 108 (90 Base) MCG/ACT inhaler ?Commonly known as: VENTOLIN HFA ?Inhale 2 puffs into the lungs every 4 (four) hours as needed for wheezing or shortness of breath. (NEEDS TO BE SEEN) ?  ?atorvastatin 40 MG tablet ?Commonly known as: LIPITOR ?Take 1 tablet (40 mg total) by mouth daily. ?  ?benazepril 20 MG tablet ?Commonly known as: LOTENSIN ?Take 1 tablet (20 mg total) by mouth daily. ?  ?buPROPion 150 MG 24 hr tablet ?Commonly known as: WELLBUTRIN XL ?Take 3 tablets (450 mg total) by mouth daily. ?  ?citalopram 40 MG tablet ?Commonly known as: CELEXA ?Take 1 tablet (40 mg total) by mouth daily. ?  ?EPINEPHrine 0.3 mg/0.3 mL Soaj injection ?Commonly known as: EPI-PEN ?Inject 0.3 mg into the muscle as needed for anaphylaxis. ?  ?fenofibrate 160 MG tablet ?Take 1 tablet (160 mg total) by mouth daily. ?  ?gabapentin 300 MG capsule ?Commonly known as: NEURONTIN ?Take 1 capsule (300 mg total) by mouth 3 (three) times daily. ?  ?glipiZIDE 10 MG 24 hr tablet ?Commonly known as: GLUCOTROL XL ?Take 1 tablet (10 mg total) by mouth daily. ?  ?meclizine 25 MG tablet ?Commonly known as: ANTIVERT ?Take 1 tablet (25 mg total) by mouth 2  (two) times daily as needed for dizziness. ?  ?ondansetron 4 MG tablet ?Commonly known as: Zofran ?Take 1 tablet (4 mg total) by mouth every 8 (eight) hours as needed for nausea or vomiting. ?  ?propranolol 40 MG tablet ?Commonly known as: INDERAL ?Take 1 tablet (40 mg total) by mouth 2 (two) times daily. (Needs to be seen before next refill) ?  ?rizatriptan 10 MG tablet ?Commonly known as: Maxalt ?Take 1 tablet (10 mg total) by mouth as needed for migraine. May repeat in 2 hours if needed ?  ?sitaGLIPtin-metformin 50-1000 MG tablet ?Commonly known as: JANUMET ?Take 1 tablet by mouth 2 (two) times daily with breakfast and lunch. ?What changed: when to take this ?  ?topiramate 50 MG tablet ?Commonly known as: TOPAMAX ?Take 1 tablet (50 mg total) by mouth 2 (two) times daily. ?  ? ?  ? ? ?Discharge Assessment: ?Vitals:  ? 08/17/21 0320 08/17/21 0809  ?BP: (!) 124/52 (!) 144/57  ?Pulse: (!) 58 (!) 58  ?Resp: 18 18  ?Temp: (!) 97.4 ?F (36.3 ?C) (!) 97.5 ?F (36.4 ?C)  ?SpO2: 98% 98%  ? Skin clean, dry and intact without evidence of skin break down, no evidence of skin tears noted. ?IV catheter discontinued intact. Site without signs and symptoms of complications - no redness or edema noted at insertion site, patient denies c/o pain - only slight tenderness at site.  Dressing with slight  pressure applied. ? ?D/c Instructions-Education: ?Discharge instructions given to patient/family with verbalized understanding. ?D/c education completed with patient/family including follow up instructions, medication list, d/c activities limitations if indicated, with other d/c instructions as indicated by MD - patient able to verbalize understanding, all questions fully answered. ?Patient instructed to return to ED, call 911, or call MD for any changes in condition.  ?Patient escorted via [place], and D/C home via private auto. ? ?Santa Lighter, RN ?08/17/2021 3:15 PM  ?

## 2021-08-21 NOTE — Discharge Summary (Signed)
Physician Discharge Summary   Patient: [name] MRN: [id number] DOB: 04-09-1961  Admit date:     08/16/2021  Discharge date: 08/21/21  Discharge Physician: Sherilyn Cooter   PCP: Chevis Pretty, FNP   Recommendations at discharge:    Follow up in 10-14 days for postop visit  Discharge Diagnoses: Principal Problem:   Carpal tunnel syndrome    Hospital Course: No notes on file  Assessment and Plan: No notes have been filed under this hospital service. Service: Hospitalist    Pain control - [employer] Controlled Substance Reporting System database was reviewed. and patient was instructed, not to drive, operate heavy machinery, perform activities at heights, swimming or participation in water activities or provide baby-sitting services while on Pain, Sleep and Anxiety Medications; until their outpatient Physician has advised to do so again. Also recommended to not to take more than prescribed Pain, Sleep and Anxiety Medications.   Consultants: None Procedures performed: Carpal tunnel release  Disposition: Home Diet recommendation:  Regular diet  DISCHARGE MEDICATION: Allergies as of 08/17/2021       Reactions   Aspirin Anaphylaxis   Bee Venom Anaphylaxis   Benadryl [diphenhydramine Hcl] Anaphylaxis   Morphine And Related Shortness Of Breath, Itching   Vicks Formula 44 Cough-cold Pm [dm-apap-cpm] Shortness Of Breath, Rash   Not anaphylaxis        Medication List     TAKE these medications    albuterol 108 (90 Base) MCG/ACT inhaler Commonly known as: VENTOLIN HFA Inhale 2 puffs into the lungs every 4 (four) hours as needed for wheezing or shortness of breath. (NEEDS TO BE SEEN)   atorvastatin 40 MG tablet Commonly known as: LIPITOR Take 1 tablet (40 mg total) by mouth daily.   benazepril 20 MG tablet Commonly known as: LOTENSIN Take 1 tablet (20 mg total) by mouth daily.   buPROPion 150 MG 24 hr tablet Commonly known as: WELLBUTRIN  XL Take 3 tablets (450 mg total) by mouth daily.   citalopram 40 MG tablet Commonly known as: CELEXA Take 1 tablet (40 mg total) by mouth daily.   EPINEPHrine 0.3 mg/0.3 mL Soaj injection Commonly known as: EPI-PEN Inject 0.3 mg into the muscle as needed for anaphylaxis.   fenofibrate 160 MG tablet Take 1 tablet (160 mg total) by mouth daily.   gabapentin 300 MG capsule Commonly known as: NEURONTIN Take 1 capsule (300 mg total) by mouth 3 (three) times daily.   glipiZIDE 10 MG 24 hr tablet Commonly known as: GLUCOTROL XL Take 1 tablet (10 mg total) by mouth daily.   meclizine 25 MG tablet Commonly known as: ANTIVERT Take 1 tablet (25 mg total) by mouth 2 (two) times daily as needed for dizziness.   ondansetron 4 MG tablet Commonly known as: Zofran Take 1 tablet (4 mg total) by mouth every 8 (eight) hours as needed for nausea or vomiting.   propranolol 40 MG tablet Commonly known as: INDERAL Take 1 tablet (40 mg total) by mouth 2 (two) times daily. (Needs to be seen before next refill)   rizatriptan 10 MG tablet Commonly known as: Maxalt Take 1 tablet (10 mg total) by mouth as needed for migraine. May repeat in 2 hours if needed   sitaGLIPtin-metformin 50-1000 MG tablet Commonly known as: JANUMET Take 1 tablet by mouth 2 (two) times daily with breakfast and lunch. What changed: when to take this   topiramate 50 MG tablet Commonly known as: TOPAMAX Take 1 tablet (50 mg total) by mouth 2 (  two) times daily.         Discharge Exam: Filed Weights   08/09/21 1451 08/16/21 0924  Weight: 92.5 kg 96.6 kg   Incision clean and dry.   Condition at discharge: good  The results of significant diagnostics from this hospitalization (including imaging, microbiology, ancillary and laboratory) are listed below for reference.   Imaging Studies: NCV with EMG (electromyography)  Result Date: 07/26/2021 Magnus Sinning, MD     07/29/2021 11:39 AM EMG & NCV Findings:  Evaluation of the right median motor nerve showed prolonged distal onset latency (7.0 ms) and decreased conduction velocity (Elbow-Wrist, 41 m/s).  The right median (across palm) sensory nerve showed prolonged distal peak latency (Wrist, 12.5 ms), reduced amplitude (6.4 V), and prolonged distal peak latency (Palm, 4.5 ms).  The right ulnar sensory nerve showed reduced amplitude (9.7 V).  All remaining nerves (as indicated in the following tables) were within normal limits.  All examined muscles (as indicated in the following table) showed no evidence of electrical instability.  Impression: The above electrodiagnostic study is ABNORMAL and reveals evidence of a moderate to severe right median nerve entrapment at the wrist (carpal tunnel syndrome) affecting sensory and motor components. There is no significant electrodiagnostic evidence of any other focal nerve entrapment, brachial plexopathy or lumbosacral plexopathy. Recommendations: 1.  Follow-up with referring physician. 2.  Continue current management of symptoms. 3.  Continue use of resting splint at night-time and as needed during the day. 4.  Suggest surgical evaluation. ___________________________ Laurence Spates FAAPMR Board Certified, American Board of Physical Medicine and Rehabilitation Nerve Conduction Studies Anti Sensory Summary Table  Stim Site NR Peak (ms) Norm Peak (ms) P-T Amp (V) Norm P-T Amp Site1 Site2 Delta-P (ms) Dist (cm) Vel (m/s) Norm Vel (m/s) Right Median Acr Palm Anti Sensory (2nd Digit)  30.6C Wrist    *12.5 <3.6 *6.4 >10 Wrist Palm 8.0 0.0   Palm    *4.5 <2.0 1.7        Right Radial Anti Sensory (Base 1st Digit)  30.9C Wrist    2.1 <3.1 22.8  Wrist Base 1st Digit 2.1 0.0   Right Ulnar Anti Sensory (5th Digit)  30.9C Wrist    3.2 <3.7 *9.7 >15.0 Wrist 5th Digit 3.2 14.0 44 >38 Motor Summary Table  Stim Site NR Onset (ms) Norm Onset (ms) O-P Amp (mV) Norm O-P Amp Site1 Site2 Delta-0 (ms) Dist (cm) Vel (m/s) Norm Vel (m/s) Right Median  Motor (Abd Poll Brev)  31C Wrist    *7.0 <4.2 7.7 >5 Elbow Wrist 4.6 19.0 *41 >50 Elbow    11.6  1.8  Axilla Elbow 2.2 0.0   Axilla    13.8  0.1        Right Ulnar Motor (Abd Dig Min)  30.7C Wrist    3.0 <4.2 7.8 >3 B Elbow Wrist 3.3 18.0 55 >53 B Elbow    6.3  6.9  A Elbow B Elbow 1.9 10.0 53 >53 A Elbow    8.2  4.8        EMG  Side Muscle Nerve Root Ins Act Fibs Psw Amp Dur Poly Recrt Int Fraser Din Comment Right Abd Poll Brev Median C8-T1 Nml Nml Nml Nml Nml 0 Nml Nml  Right 1stDorInt Ulnar C8-T1 Nml Nml Nml Nml Nml 0 Nml Nml  Right PronatorTeres Median C6-7 Nml Nml Nml Nml Nml 0 Nml Nml  Right Biceps Musculocut C5-6 Nml Nml Nml Nml Nml 0 Nml Nml  Right Deltoid Axillary C5-6  Nml Nml Nml Nml Nml 0 Nml Nml  Nerve Conduction Studies Anti Sensory Left/Right Comparison  Stim Site L Lat (ms) R Lat (ms) L-R Lat (ms) L Amp (V) R Amp (V) L-R Amp (%) Site1 Site2 L Vel (m/s) R Vel (m/s) L-R Vel (m/s) Median Acr Palm Anti Sensory (2nd Digit)  30.6C Wrist  *12.5   *6.4  Wrist Palm    Palm  *4.5   1.7       Radial Anti Sensory (Base 1st Digit)  30.9C Wrist  2.1   22.8  Wrist Base 1st Digit    Ulnar Anti Sensory (5th Digit)  30.9C Wrist  3.2   *9.7  Wrist 5th Digit  44  Motor Left/Right Comparison  Stim Site L Lat (ms) R Lat (ms) L-R Lat (ms) L Amp (mV) R Amp (mV) L-R Amp (%) Site1 Site2 L Vel (m/s) R Vel (m/s) L-R Vel (m/s) Median Motor (Abd Poll Brev)  31C Wrist  *7.0   7.7  Elbow Wrist  *41  Elbow  11.6   1.8  Axilla Elbow    Axilla  13.8   0.1       Ulnar Motor (Abd Dig Min)  30.7C Wrist  3.0   7.8  B Elbow Wrist  55  B Elbow  6.3   6.9  A Elbow B Elbow  53  A Elbow  8.2   4.8       Waveforms:         Microbiology: Results for orders placed or performed in visit on 04/06/20  Novel Coronavirus, NAA ([facility])     Status: None   Collection Time: 04/06/20  4:56 PM   Specimen: Nasopharyngeal(NP) swabs in vial transport medium  Result Value Ref Range Status   SARS-CoV-2, NAA Not Detected Not Detected Final    Comment:  This nucleic acid amplification test was developed and its performance characteristics determined by [employer]. Nucleic acid amplification tests include RT-PCR and TMA. This test has not been FDA cleared or approved. This test has been authorized by FDA under an Emergency Use Authorization (EUA). This test is only authorized for the duration of time the declaration that circumstances exist justifying the authorization of the emergency use of in vitro diagnostic tests for detection of SARS-CoV-2 virus and/or diagnosis of COVID-19 infection under section 564(b)(1) of the Act, 21 U.S.C. 761PJK-9(T) (1), unless the authorization is terminated or revoked sooner. When diagnostic testing is negative, the possibility of a false negative result should be considered in the context of a patient's recent exposures and the presence of clinical signs and symptoms consistent with COVID-19. An individual without symptoms of COVID-19 and who is not shedding SARS-CoV-2 virus wo uld expect to have a negative (not detected) result in this assay.   SARS-COV-2, NAA 2 DAY TAT     Status: None   Collection Time: 04/06/20  4:56 PM  Result Value Ref Range Status   SARS-CoV-2, NAA 2 DAY TAT Performed  Final    Labs: CBC: No results for input(s): WBC, NEUTROABS, HGB, HCT, MCV, PLT in the last 168 hours. Basic Metabolic Panel: Recent Labs  Lab 08/15/21 1341  NA 132*  K 4.8  CL 100  CO2 23  GLUCOSE 234*  BUN 19  CREATININE 0.88  CALCIUM 9.1   Liver Function Tests: No results for input(s): AST, ALT, ALKPHOS, BILITOT, PROT, ALBUMIN in the last 168 hours. CBG: Recent Labs  Lab 08/16/21 1042 08/16/21 1235 08/16/21 1810 08/16/21 2012  08/17/21 0809  GLUCAP 219* 170* 309* 255* 258*    Discharge time spent: less than 30 minutes.  Signed: Sherilyn Cooter, MD [facility] 08/21/2021

## 2021-08-28 ENCOUNTER — Encounter: Payer: Self-pay | Admitting: Orthopedic Surgery

## 2021-08-28 ENCOUNTER — Ambulatory Visit (INDEPENDENT_AMBULATORY_CARE_PROVIDER_SITE_OTHER): Payer: Commercial Managed Care - PPO | Admitting: Orthopedic Surgery

## 2021-08-28 ENCOUNTER — Other Ambulatory Visit: Payer: Self-pay

## 2021-08-28 DIAGNOSIS — G5601 Carpal tunnel syndrome, right upper limb: Secondary | ICD-10-CM

## 2021-08-28 NOTE — Progress Notes (Signed)
? ?  Post-Op Visit Note ?  ?Patient: [name]           ?Date of Birth: 1960-09-12           ?MRN: [id number] ?Visit Date: 08/28/2021 ?PCP: Chevis Pretty, FNP ? ? ?Assessment & Plan: ? ?Chief Complaint:  ?Chief Complaint  ?Patient presents with  ? Right Hand - Routine Post Op  ? ?Visit Diagnoses:  ?1. Carpal tunnel syndrome of right wrist   ? ? ?Plan: Patient is now 12 days s/p R CTR.  She is doing very well postoperatively.  Her numbness and paresthesias have resolved.  She denies any continued nocturnal symptoms.  Her incision is clean, dry, and well approximated.  The nylon sutures were removed today. Discussed scar massage.  She can see me again in another month if she has any issues.  ? ?Follow-Up Instructions: No follow-ups on file.  ? ?Orders:  ?No orders of the defined types were placed in this encounter. ? ?No orders of the defined types were placed in this encounter. ? ? ?Imaging: ?No results found. ? ?PMFS History: ?Patient Active Problem List  ? Diagnosis Date Noted  ? Carpal tunnel syndrome 08/16/2021  ? Numbness and tingling in right hand 07/11/2021  ? Diverticulosis 07/13/2016  ? Morbid obesity ([place]) 03/31/2015  ? Hyperlipidemia   ? Hypothyroidism   ? Hypertension   ? POLYP, COLON 04/13/2010  ? Diabetes ([employer]) 04/13/2010  ? Anxiety state 04/13/2010  ? Depression 04/13/2010  ? Asthma 04/13/2010  ? Migraines 04/13/2010  ? ?Past Medical History:  ?Diagnosis Date  ? Abdominal discomfort   ? Chronic abdominal and pelvic pain resulting in significant loss of time from work  ? Anxiety   ? with depression  ? Asthma   ? Chest pain   ? Depression   ? Diabetes mellitus   ? Drug overdose 06/12/2007  ? 60 Naprosyn, psychiatric admission  ? Hyperlipidemia   ? Severe  ? Hypertension   ? Migraines   ? Tremor of both hands   ? on propranolol  ?  ?History reviewed. No pertinent family history.  ?Past Surgical History:  ?Procedure Laterality Date  ? ABDOMINAL HYSTERECTOMY  02/10/2007  ? + lysis of  adhesions  ? APPENDECTOMY  1990s  ? ruptured, late 90s  ? BILATERAL OOPHORECTOMY    ?  in 2 surgeries prior to 9/08  ? CARPAL TUNNEL RELEASE Right 08/16/2021  ? Procedure: RIGHT CARPAL TUNNEL RELEASE;  Surgeon: Sherilyn Cooter, MD;  Location: [facility];  Service: Orthopedics;  Laterality: Right;  ? COLONOSCOPY  02/09/2010  ?  normal upper endoscopy, single colonic polyp,  ? HERNIA REPAIR    ? VENTRAL HERNIA REPAIR    ? x3  ? ?Social History  ? ?Occupational History  ? Occupation: Tourist information centre manager  ?Tobacco Use  ? Smoking status: Never  ? Smokeless tobacco: Never  ?Vaping Use  ? Vaping Use: Not on file  ?Substance and Sexual Activity  ? Alcohol use: No  ? Drug use: No  ? Sexual activity: Not Currently  ? ? ? ?

## 2021-09-06 ENCOUNTER — Telehealth: Payer: Self-pay | Admitting: Orthopedic Surgery

## 2021-09-06 ENCOUNTER — Encounter: Payer: Self-pay | Admitting: Radiology

## 2021-09-06 NOTE — Telephone Encounter (Signed)
I tried to call her but there was no answer--I will try again. I will write the note for her to be out of work for 2 weeks. ?

## 2021-09-06 NOTE — Telephone Encounter (Signed)
Patient called advised she still can not close her hand.  Patient said her employer said she can not come back to work unless she can do her job without restrictions. 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 Patient asked if she can get a note from Dr. Tempie Donning stating how long she will be out of work. The number to contact patient is [phone] ?

## 2021-09-07 NOTE — Telephone Encounter (Signed)
Patient called back and she would like to try therapy for her hand ?

## 2021-09-07 NOTE — Telephone Encounter (Signed)
Lmom for pt to let her know that we have wrote her a note to be out of work for 2 weeks and I have asked her if she would like to do hand therapy. I have asked her to call back and let us know about hand therapy. ?

## 2021-09-08 ENCOUNTER — Other Ambulatory Visit: Payer: Self-pay | Admitting: Orthopedic Surgery

## 2021-09-08 DIAGNOSIS — G5601 Carpal tunnel syndrome, right upper limb: Secondary | ICD-10-CM

## 2021-09-14 LAB — HM DIABETES EYE EXAM

## 2021-09-18 ENCOUNTER — Telehealth: Payer: Self-pay | Admitting: Orthopedic Surgery

## 2021-09-18 NOTE — Telephone Encounter (Signed)
Pt called requesting to send her employer extended time off work due to surgery 3/8. Pt has no started physical therapy. Please call pt about this matter at [phone]. ?

## 2021-09-19 NOTE — Telephone Encounter (Signed)
Patient had right carpal tunnel release preformed on 08/16/2021. Do we have a estimated time on out of work status. Attempted to contact patient to find out job description however was unable to leave voicemail.  ?

## 2021-09-20 ENCOUNTER — Telehealth: Payer: Self-pay | Admitting: Orthopedic Surgery

## 2021-09-20 ENCOUNTER — Encounter: Payer: Self-pay | Admitting: Radiology

## 2021-09-20 NOTE — Telephone Encounter (Signed)
Tried to call but there was no answer-states "your call can not be completed at this time, try your call again later". ?

## 2021-09-20 NOTE — Telephone Encounter (Signed)
She needs to OT here at our office to get scheduled. ?

## 2021-09-20 NOTE — Telephone Encounter (Signed)
Pt called requesting a extended letter out of work. 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 Please call this pt as soon as the letter is drawn up and ready for pick up. Pt phone number is [phone]. ?

## 2021-09-20 NOTE — Telephone Encounter (Signed)
Tried again to call but there was no answer-states "your call can not be completed at this time, try your call again later". ?---1) her note is ready at the front desk for pick up downstairs ?---2) She needs to call the PT line and get scheduled for OT ?

## 2021-09-20 NOTE — Telephone Encounter (Signed)
Pt's letter is ready fro pick up for extension to be out of work. Also pt needs to call physical therapy upstairs to set appt. No answer and no vm to leave pt a message ?

## 2021-09-20 NOTE — Telephone Encounter (Signed)
Note is ready for pick up ?

## 2021-09-25 ENCOUNTER — Ambulatory Visit (INDEPENDENT_AMBULATORY_CARE_PROVIDER_SITE_OTHER): Payer: Commercial Managed Care - PPO | Admitting: Rehabilitative and Restorative Service Providers"

## 2021-09-25 ENCOUNTER — Encounter: Payer: Self-pay | Admitting: Rehabilitative and Restorative Service Providers"

## 2021-09-25 ENCOUNTER — Other Ambulatory Visit: Payer: Self-pay

## 2021-09-25 ENCOUNTER — Encounter: Payer: Self-pay | Admitting: Radiology

## 2021-09-25 DIAGNOSIS — M79641 Pain in right hand: Secondary | ICD-10-CM

## 2021-09-25 DIAGNOSIS — M25631 Stiffness of right wrist, not elsewhere classified: Secondary | ICD-10-CM

## 2021-09-25 DIAGNOSIS — M6281 Muscle weakness (generalized): Secondary | ICD-10-CM

## 2021-09-25 DIAGNOSIS — R278 Other lack of coordination: Secondary | ICD-10-CM | POA: Diagnosis not present

## 2021-09-25 NOTE — Therapy (Signed)
?OUTPATIENT OCCUPATIONAL THERAPY ORTHO EVALUATION ? ?Patient Name: [name] ?MRN: [id number] ?DOB:15-May-1961, 61 y.o., female ?Today's Date: 09/25/2021 ? ?PCP: Chevis Pretty, FNP ?REFERRING PROVIDER: Sherilyn Cooter, MD ? ? OT End of Session - 09/25/21 0802   ? ? Visit Number 1   ? Number of Visits 10   ? Date for OT Re-Evaluation 11/03/21   ? OT Start Time [phone]   ? OT Stop Time [id number]   ? OT Time Calculation (min) 49 min   ? Activity Tolerance Patient tolerated treatment well;No increased pain;Patient limited by pain   ? Behavior During Therapy [facility] for tasks assessed/performed   ? ?  ?  ? ?  ? ? ?Past Medical History:  ?Diagnosis Date  ? Abdominal discomfort   ? Chronic abdominal and pelvic pain resulting in significant loss of time from work  ? Anxiety   ? with depression  ? Asthma   ? Chest pain   ? Depression   ? Diabetes mellitus   ? Drug overdose 06/12/2007  ? 60 Naprosyn, psychiatric admission  ? Hyperlipidemia   ? Severe  ? Hypertension   ? Migraines   ? Tremor of both hands   ? on propranolol  ? ?Past Surgical History:  ?Procedure Laterality Date  ? ABDOMINAL HYSTERECTOMY  02/10/2007  ? + lysis of adhesions  ? APPENDECTOMY  1990s  ? ruptured, late 90s  ? BILATERAL OOPHORECTOMY    ?  in 2 surgeries prior to 9/08  ? CARPAL TUNNEL RELEASE Right 08/16/2021  ? Procedure: RIGHT CARPAL TUNNEL RELEASE;  Surgeon: Sherilyn Cooter, MD;  Location: [place];  Service: Orthopedics;  Laterality: Right;  ? COLONOSCOPY  02/09/2010  ?  normal upper endoscopy, single colonic polyp,  ? HERNIA REPAIR    ? VENTRAL HERNIA REPAIR    ? x3  ? ?Patient Active Problem List  ? Diagnosis Date Noted  ? Carpal tunnel syndrome 08/16/2021  ? Numbness and tingling in right hand 07/11/2021  ? Diverticulosis 07/13/2016  ? Morbid obesity ([place]) 03/31/2015  ? Hyperlipidemia   ? Hypothyroidism   ? Hypertension   ? POLYP, COLON 04/13/2010  ? Diabetes ([place]) 04/13/2010  ? Anxiety state 04/13/2010  ? Depression  04/13/2010  ? Asthma 04/13/2010  ? Migraines 04/13/2010  ? ? ?ONSET DATE: 08/16/21 DOS ? ?REFERRING DIAG: G56.01 (ICD-10-CM) - Carpal tunnel syndrome of right wrist ? ?THERAPY DIAG:  ?Stiffness of right wrist, not elsewhere classified ? ?Muscle weakness (generalized) ? ?Other lack of coordination ? ?SUBJECTIVE:  ? 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 She states not being 100% yet to return to work. She states having L CTR 20 years ago (and was out of work for 10 weeks after that) and R hand likely began to have problems around that time. She states still having numbness, tenderness at scar, difficulty putting pressure on palm and holding/lifting objects. She does have DM II and neuropathy issues in her feet. She also has hx of essential tremor for which she takes medications.  ? ?PRECAUTIONS: ~6 weeks from sx now, PRE ok, recommend to avoid repetitive or heavy lifting/grasping for 4-6 more weeks or as tolerated.  ? ?PAIN:  ?Are you having pain? Yes ?Rating: 5/10 at rest today ? ?FALLS: Has patient fallen in last 6 months? No ? ?LIVING ENVIRONMENT: ?Lives with: lives alone (widowed) ? ?PLOF: Independent ? ?PATIENT GOALS She states she'd like to be able to grip with  her hand again, she thinks it would be fun to play tennis (she hasn't played in a long time).  ? ?OBJECTIVE:  ? ?HAND DOMINANCE: Left ? ?ADLs: ?Overall ADLs: Having trouble and pain opening things, holding/using silverware, sleeping, clothing fasteners, etc., as well as significant problems with IADLs.  ? ? ?FUNCTIONAL OUTCOME MEASURES: ?Quick Dash: 61% impairment today  ? ?UE ROM   Rt hand had ~3cm gap to full fist with AROM at eval but passively was ~1cm gap or less.  ? ?AROM Right ?09/25/2021  ?Wrist flexion (-26*)  ?Wrist extension 48*  ?Wrist ulnar deviation   ?Wrist radial deviation   ?Wrist pronation 70  ?Wrist supination 80  ?(Blank rows = not tested) ? ? ?UE MMT:   NT at eval due to pain but  overly weak and scores 3-/5MMT in right hand grossly ? ?MMT Right ?09/25/2021  ?Shoulder flexion   ?Shoulder abduction   ?Shoulder adduction   ?Shoulder extension   ?Shoulder internal rotation   ?Shoulder external rotation   ?Middle trapezius   ?Lower trapezius   ?Elbow flexion   ?Elbow extension   ?Wrist flexion   ?Wrist extension   ?Wrist ulnar deviation   ?Wrist radial deviation   ?Wrist pronation   ?Wrist supination   ?(Blank rows = not tested) ? ?HAND FUNCTION: ?Grip strength: Right: 17 lbs; Left: 62 lbs and 3 point pinch: Right: TBD lbs, Left: TBD lbs ? ?COORDINATION: ?9 Hole Peg test: Right: 34 sec; Left: 27 sec ? ?SENSATION: ?SWMFT: 3.61 in Rt hand Ulnar and Median Nerves (2.83 in Lt hand globally)  ? ?EDEMA: very mild swelling about right hand/palm now ? ?COGNITION: ?Overall cognitive status: Within functional limits for tasks assessed ? ? ?OBSERVATIONS: She was flinching in pain and yelling out with only slight wrist motion today, also not able to tolerate direct light touch to sx are without significant pain/flinching today  ? ? ?TODAY'S TREATMENT:  ?09/25/21 Eval: OT educates on nerve issues and ways to avoid exacerbations in daily life (sleep, habits, driving, etc.). OT supplies her with built up foam tubing and edu to use with silverware to help with fnl use at home (due to pain/decreased motion/grip). OT also edu on how to get better through neuro desensitization, and exercises like shoulder, elbow, forearm, wrist and hand motion for now in tolerable limited 4-6xday. Also given thumb opposition activity to be performing at home for HEP as well. She demo's all back in tolerable planes of motion. She was barely able to tolerate wrist motion today without high nervous pains/yelling out. It may become necessary to provide night/resting orthotics if pain remains high. She states understanding POC and education so far.  ? ? ?PATIENT EDUCATION: ?Education details: see tx section above for details  ?Person  educated: Patient ?Education method: Explanation, Demonstration, Verbal cues, and Handouts ?Education comprehension: verbalized understanding, returned demonstration, verbal cues required, and needs further education ? ? ?HOME EXERCISE PROGRAM: ?see tx section above for details  ? ?GOALS: ?Goals reviewed with patient? Yes ? ?SHORT TERM GOALS: (STG required if POC>30 days) ? ?Pt will obtain protective, custom orthotic. ?Target date: As Needed for continued pains in next 1-2 weeks ?Goal status: INITIAL ? ?2.  Pt will demo/state understanding of initial HEP to improve pain levels and prerequisite motion. ?Target date: 10/06/21 ?Goal status: INITIAL ? ? ?LONG TERM GOALS: ? ?Pt will improve functional ability by decreased impairment per Quick DASH assessment from 61% to 15% or better, for better quality of life. ?  Target date: 11/03/21 ?Goal status: INITIAL ? ?2.  Pt will improve grip strength in right hand from 17lbs to at least 45lbs for functional use at home and in IADLs. ?Target date: 11/03/21 ?Goal status: INITIAL ? ?3.  Pt will improve A/ROM in right wrist flexion from (-26*) to at least 45*, to have functional motion for tasks like reach and grasp.  ?Target date: 11/03/21 ?Goal status: INITIAL ? ?4.  Pt will improve strength in right wrist from 3-/5 MMT to at least 4+/5 MMT to have increased functional ability to carry out selfcare and higher-level homecare tasks with no difficulty. ?Target date: 11/03/21 ?Goal status: INITIAL ? ?5.  Pt will decrease pain at rest from 5/10 to 1/10 or better to have better sleep and occupational participation in daily roles. ?Target date: 11/03/21 ?Goal status: INITIAL ? ? ? ?ASSESSMENT: ? ?CLINICAL IMPRESSION: ?Patient is a 61 y.o. female who was seen today for occupational therapy evaluation for right non-dom hand pain, stiffness, decreased functional ability, etc.  ? ?PERFORMANCE DEFICITS in functional skills including ADLs, IADLs, coordination, dexterity, sensation, edema, tone, ROM,  strength, pain, fascial restrictions, flexibility, FMC, GMC, body mechanics, endurance, decreased knowledge of precautions, and UE functional use, cognitive skills including problem solving and safety awareness,

## 2021-09-27 ENCOUNTER — Encounter: Payer: Commercial Managed Care - PPO | Admitting: Rehabilitative and Restorative Service Providers"

## 2021-09-27 NOTE — Therapy (Incomplete)
?OUTPATIENT OCCUPATIONAL THERAPY TREATMENT NOTE ? ? ?Patient Name: [name] ?MRN: [id number] ?DOB:25-May-1961, 61 y.o., female ?Today's Date: 09/27/2021 ? ?PCP: Chevis Pretty, FNP ?REFERRING PROVIDER: Sherilyn Cooter, MD ? ?END OF SESSION:  ? ? ?Past Medical History:  ?Diagnosis Date  ? Abdominal discomfort   ? Chronic abdominal and pelvic pain resulting in significant loss of time from work  ? Anxiety   ? with depression  ? Asthma   ? Chest pain   ? Depression   ? Diabetes mellitus   ? Drug overdose 06/12/2007  ? 60 Naprosyn, psychiatric admission  ? Hyperlipidemia   ? Severe  ? Hypertension   ? Migraines   ? Tremor of both hands   ? on propranolol  ? ?Past Surgical History:  ?Procedure Laterality Date  ? ABDOMINAL HYSTERECTOMY  02/10/2007  ? + lysis of adhesions  ? APPENDECTOMY  1990s  ? ruptured, late 90s  ? BILATERAL OOPHORECTOMY    ?  in 2 surgeries prior to 9/08  ? CARPAL TUNNEL RELEASE Right 08/16/2021  ? Procedure: RIGHT CARPAL TUNNEL RELEASE;  Surgeon: Sherilyn Cooter, MD;  Location: [place];  Service: Orthopedics;  Laterality: Right;  ? COLONOSCOPY  02/09/2010  ?  normal upper endoscopy, single colonic polyp,  ? HERNIA REPAIR    ? VENTRAL HERNIA REPAIR    ? x3  ? ?Patient Active Problem List  ? Diagnosis Date Noted  ? Carpal tunnel syndrome 08/16/2021  ? Numbness and tingling in right hand 07/11/2021  ? Diverticulosis 07/13/2016  ? Morbid obesity ([place]) 03/31/2015  ? Hyperlipidemia   ? Hypothyroidism   ? Hypertension   ? POLYP, COLON 04/13/2010  ? Diabetes ([place]) 04/13/2010  ? Anxiety state 04/13/2010  ? Depression 04/13/2010  ? Asthma 04/13/2010  ? Migraines 04/13/2010  ? ? ?ONSET DATE: 08/16/21 DOS ?  ?REFERRING DIAG: G56.01 (ICD-10-CM) - Carpal tunnel syndrome of right wrist ? ?THERAPY DIAG:  ?No diagnosis found. ? ? 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 She states not being 100% yet to return to work. She states having L CTR 20  years ago (and was out of work for 10 weeks after that) and R hand likely began to have problems around that time. She states still having numbness, tenderness at scar, difficulty putting pressure on palm and holding/lifting objects. She does have DM II and neuropathy issues in her feet. She also has hx of essential tremor for which she takes medications.  ? ?PRECAUTIONS: ~6 weeks from sx now, PRE ok, recommend to avoid repetitive or heavy lifting/grasping for 4-6 more weeks or as tolerated.  ? ?SUBJECTIVE:  ?*** ? ?PAIN:  ?Are you having pain? *** ?Rating: ***/10 at rest now  ? ? ? ?OBJECTIVE: (All objective assessments below are from initial evaluation on: 09/25/21 unless otherwise specified.)  ? ? ?HAND DOMINANCE: Left ?  ?ADLs: ?Overall ADLs: Having trouble and pain opening things, holding/using silverware, sleeping, clothing fasteners, etc., as well as significant problems with IADLs.  ?  ?  ?FUNCTIONAL OUTCOME MEASURES: ?Quick Dash: 61% impairment today  ?  ?UE ROM   Rt hand had ~3cm gap to full fist with AROM at eval but passively was ~1cm gap or less.  ?  ?AROM Right ?09/25/2021  ?Wrist flexion (-26*)  ?Wrist extension 48*  ?Wrist ulnar deviation    ?Wrist radial deviation    ?Wrist pronation 70  ?Wrist supination 80  ?(Blank rows = not tested) ?  ?  ?  UE MMT:   NT at eval due to pain but overly weak and scores 3-/5MMT in right hand grossly ?  ?MMT Right ?09/25/2021  ?Shoulder flexion    ?Shoulder abduction    ?Shoulder adduction    ?Shoulder extension    ?Shoulder internal rotation    ?Shoulder external rotation    ?Middle trapezius    ?Lower trapezius    ?Elbow flexion    ?Elbow extension    ?Wrist flexion    ?Wrist extension    ?Wrist ulnar deviation    ?Wrist radial deviation    ?Wrist pronation    ?Wrist supination    ?(Blank rows = not tested) ?  ?HAND FUNCTION: ?Grip strength: Right: 17 lbs; Left: 62 lbs and 3 point pinch: Right: TBD lbs, Left: TBD lbs ?  ?COORDINATION: ?9 Hole Peg test: Right: 34 sec;  Left: 27 sec ?  ?SENSATION: ?SWMFT: 3.61 in Rt hand Ulnar and Median Nerves (2.83 in Lt hand globally)  ?  ?EDEMA: very mild swelling about right hand/palm now ?  ?COGNITION: ?Overall cognitive status: Within functional limits for tasks assessed ?  ?  ?OBSERVATIONS: She was flinching in pain and yelling out with only slight wrist motion today, also not able to tolerate direct light touch to sx are without significant pain/flinching today  ?  ?  ?TODAY'S TREATMENT:  ?09/27/21: *** ? ?09/25/21 Eval: OT educates on nerve issues and ways to avoid exacerbations in daily life (sleep, habits, driving, etc.). OT supplies her with built up foam tubing and edu to use with silverware to help with fnl use at home (due to pain/decreased motion/grip). OT also edu on how to get better through neuro desensitization, and exercises like shoulder, elbow, forearm, wrist and hand motion for now in tolerable limited 4-6xday. Also given thumb opposition activity to be performing at home for HEP as well. She demo's all back in tolerable planes of motion. She was barely able to tolerate wrist motion today without high nervous pains/yelling out. It may become necessary to provide night/resting orthotics if pain remains high. She states understanding POC and education so far.  ?  ?  ?PATIENT EDUCATION: ?Education details: see tx section above for details  ?Person educated: Patient ?Education method: Explanation, Demonstration, Verbal cues, and Handouts ?Education comprehension: verbalized understanding, returned demonstration, verbal cues required, and needs further education ?  ?  ?HOME EXERCISE PROGRAM: ?see tx section above for details  ?  ?GOALS: ?Goals reviewed with patient? Yes ?  ?SHORT TERM GOALS: (STG required if POC>30 days) ?  ?Pt will obtain protective, custom orthotic. ?Target date: As Needed for continued pains in next 1-2 weeks ?Goal status: INITIAL ?  ?2.  Pt will demo/state understanding of initial HEP to improve pain levels  and prerequisite motion. ?Target date: 10/06/21 ?Goal status: INITIAL ?  ?  ?LONG TERM GOALS: ?  ?Pt will improve functional ability by decreased impairment per Quick DASH assessment from 61% to 15% or better, for better quality of life. ?Target date: 11/03/21 ?Goal status: INITIAL ?  ?2.  Pt will improve grip strength in right hand from 17lbs to at least 45lbs for functional use at home and in IADLs. ?Target date: 11/03/21 ?Goal status: INITIAL ?  ?3.  Pt will improve A/ROM in right wrist flexion from (-26*) to at least 45*, to have functional motion for tasks like reach and grasp.  ?Target date: 11/03/21 ?Goal status: INITIAL ?  ?4.  Pt will improve strength in right wrist from  3-/5 MMT to at least 4+/5 MMT to have increased functional ability to carry out selfcare and higher-level homecare tasks with no difficulty. ?Target date: 11/03/21 ?Goal status: INITIAL ?  ?5.  Pt will decrease pain at rest from 5/10 to 1/10 or better to have better sleep and occupational participation in daily roles. ?Target date: 11/03/21 ?Goal status: INITIAL ?  ?  ?  ?ASSESSMENT: ?  ?CLINICAL IMPRESSION: ?09/27/21: *** ?  ?  ?PLAN: ?OT FREQUENCY: 2x/week ?  ?OT DURATION: 6 weeks ?  ?PLANNED INTERVENTIONS: self care/ADL training, therapeutic exercise, therapeutic activity, neuromuscular re-education, manual therapy, scar mobilization, passive range of motion, splinting, electrical stimulation, ultrasound, fluidotherapy, compression bandaging, moist heat, cryotherapy, contrast bath, patient/family education, coping strategies training, and DME and/or AE instructions ?  ?RECOMMENDED OTHER SERVICES: none now ?  ?CONSULTED AND AGREED WITH PLAN OF CARE: Patient ?  ?PLAN FOR NEXT SESSION:  ?*** ?review initial HEP and education to avoid painful/numbing positions/habits, progress desensitizations and when tolerated add PROM/stretches to hand/wrist and hand strength, functional training.  ?  ? ? ?Benito Mccreedy, OTR/L, CHT ?09/27/2021, 7:59 AM ? ?  ? ?   ?

## 2021-09-29 ENCOUNTER — Encounter: Payer: Self-pay | Admitting: Rehabilitative and Restorative Service Providers"

## 2021-09-29 ENCOUNTER — Ambulatory Visit: Payer: Commercial Managed Care - PPO | Admitting: Rehabilitative and Restorative Service Providers"

## 2021-09-29 DIAGNOSIS — R278 Other lack of coordination: Secondary | ICD-10-CM

## 2021-09-29 DIAGNOSIS — M6281 Muscle weakness (generalized): Secondary | ICD-10-CM | POA: Diagnosis not present

## 2021-09-29 DIAGNOSIS — M25631 Stiffness of right wrist, not elsewhere classified: Secondary | ICD-10-CM

## 2021-09-29 DIAGNOSIS — M79641 Pain in right hand: Secondary | ICD-10-CM | POA: Diagnosis not present

## 2021-09-29 NOTE — Therapy (Signed)
?OUTPATIENT OCCUPATIONAL THERAPY TREATMENT NOTE ? ? ?Patient Name: [name] ?MRN: [id number] ?DOB:05-15-1961, 61 y.o., female ?Today's Date: 09/29/2021 ? ?PCP: Chevis Pretty, FNP ?REFERRING PROVIDER: Sherilyn Cooter, MD ? ?END OF SESSION:  ? OT End of Session - 09/29/21 0801   ? ? Visit Number 2   ? Number of Visits 10   ? Date for OT Re-Evaluation 11/03/21   ? OT Start Time 0801   ? OT Stop Time 0846   ? OT Time Calculation (min) 45 min   ? Activity Tolerance Patient tolerated treatment well;No increased pain;Patient limited by pain   ? Behavior During Therapy [facility] for tasks assessed/performed   ? ?  ?  ? ?  ? ? ?Past Medical History:  ?Diagnosis Date  ? Abdominal discomfort   ? Chronic abdominal and pelvic pain resulting in significant loss of time from work  ? Anxiety   ? with depression  ? Asthma   ? Chest pain   ? Depression   ? Diabetes mellitus   ? Drug overdose 06/12/2007  ? 60 Naprosyn, psychiatric admission  ? Hyperlipidemia   ? Severe  ? Hypertension   ? Migraines   ? Tremor of both hands   ? on propranolol  ? ?Past Surgical History:  ?Procedure Laterality Date  ? ABDOMINAL HYSTERECTOMY  02/10/2007  ? + lysis of adhesions  ? APPENDECTOMY  1990s  ? ruptured, late 90s  ? BILATERAL OOPHORECTOMY    ?  in 2 surgeries prior to 9/08  ? CARPAL TUNNEL RELEASE Right 08/16/2021  ? Procedure: RIGHT CARPAL TUNNEL RELEASE;  Surgeon: Sherilyn Cooter, MD;  Location: [place];  Service: Orthopedics;  Laterality: Right;  ? COLONOSCOPY  02/09/2010  ?  normal upper endoscopy, single colonic polyp,  ? HERNIA REPAIR    ? VENTRAL HERNIA REPAIR    ? x3  ? ?Patient Active Problem List  ? Diagnosis Date Noted  ? Carpal tunnel syndrome 08/16/2021  ? Numbness and tingling in right hand 07/11/2021  ? Diverticulosis 07/13/2016  ? Morbid obesity ([place]) 03/31/2015  ? Hyperlipidemia   ? Hypothyroidism   ? Hypertension   ? POLYP, COLON 04/13/2010  ? Diabetes ([place]) 04/13/2010  ? Anxiety state 04/13/2010   ? Depression 04/13/2010  ? Asthma 04/13/2010  ? Migraines 04/13/2010  ? ? ?ONSET DATE: 08/16/21 DOS ?  ?REFERRING DIAG: G56.01 (ICD-10-CM) - Carpal tunnel syndrome of right wrist ? ?THERAPY DIAG:  ?Stiffness of right wrist, not elsewhere classified ? ?Muscle weakness (generalized) ? ?Other lack of coordination ? ?Pain in right hand ? ? ?PERTINENT HISTORY: She states still having numbness, tenderness at scar, difficulty putting pressure on palm and holding/lifting objects. She does have DM II and neuropathy issues in her feet. She also has hx of essential tremor for which she takes medications ? ?PRECAUTIONS: ~6 weeks from sx now, PRE ok, recommend to avoid repetitive or heavy lifting/grasping for 4-6 more weeks or as tolerated.  ? ?SUBJECTIVE: She states accidentally sleeping on hand last night. She has been doing HEP and feeling better.  ? ?PAIN:  ?Are you having pain? Yes  ?Rating: 6/10 at ret now  ? ?OBJECTIVE: (All objective assessments below are from initial evaluation on: 09/25/21 unless otherwise specified.)  ? ?HAND DOMINANCE: Left ?  ?ADLs: ?Overall ADLs: Having trouble and pain opening things, holding/using silverware, sleeping, clothing fasteners, etc., as well as significant problems with IADLs.  ?  ?  ?FUNCTIONAL OUTCOME MEASURES: ?Quick Dash: 61% impairment today  ?  ?  UE ROM   Rt hand had ~3cm gap to full fist with AROM at eval but passively was ~1cm gap or less.  ?  ?AROM Right ?09/25/2021  ?Wrist flexion (-26*)  ?Wrist extension 48*  ?Wrist ulnar deviation    ?Wrist radial deviation    ?Wrist pronation 70  ?Wrist supination 80  ?(Blank rows = not tested) ?  ?  ?UE MMT:   NT at eval due to pain but overly weak and scores 3-/5MMT in right hand grossly ?  ?MMT Right ?09/25/2021  ?Shoulder flexion    ?Shoulder abduction    ?Shoulder adduction    ?Shoulder extension    ?Shoulder internal rotation    ?Shoulder external rotation    ?Middle trapezius    ?Lower trapezius    ?Elbow flexion    ?Elbow extension     ?Wrist flexion    ?Wrist extension    ?Wrist ulnar deviation    ?Wrist radial deviation    ?Wrist pronation    ?Wrist supination    ?(Blank rows = not tested) ?  ?HAND FUNCTION: ?Grip strength: Right: 17 lbs; Left: 62 lbs and 3 point pinch: Right: TBD lbs, Left: TBD lbs ?  ?COORDINATION: ?9 Hole Peg test: Right: 34 sec; Left: 27 sec ?  ?SENSATION: ?SWMFT: 3.61 in Rt hand Ulnar and Median Nerves (2.83 in Lt hand globally)  ?  ?EDEMA: very mild swelling about right hand/palm now ?  ?COGNITION: ?Overall cognitive status: Within functional limits for tasks assessed ?  ?  ?OBSERVATIONS: She was flinching in pain and yelling out with only slight wrist motion today, also not able to tolerate direct light touch to sx are without significant pain/flinching today  ?  ?  ?TODAY'S TREATMENT:  ?09/29/21: OT reviews initial HEP while pt in fluidotherapy for desensitization today 5 mins.  She tolerates LT directly over scar and motion much better- no flinching pain now. She is edu on new, upgraded HEP as below and tolerates light stretches and nerve glides today.  ? ?Exercises ?- Seated Forearm Pronation and Supination AROM  - 4-6 x daily - 1 sets - 10-15 reps ?- Wrist AROM Flexion Extension  - 4-6 x daily - 10-15 reps ?- Seated Wrist Flexion Stretch  - 3-4 x daily - 3 reps - 10 second  hold ?- Wrist Prayer Stretch  - 3-4 x daily - 3-5 reps - 15 sec hold ?- Tendon Glides  - 3-4 x daily - 5- 10 reps - 2-3 seconds hold ?- Median Nerve Glides (2 ways) 3-4 x day  ? ? ?09/25/21 Eval: OT educates on nerve issues and ways to avoid exacerbations in daily life (sleep, habits, driving, etc.). OT supplies her with built up foam tubing and edu to use with silverware to help with fnl use at home (due to pain/decreased motion/grip). OT also edu on how to get better through neuro desensitization, and exercises like shoulder, elbow, forearm, wrist and hand motion for now in tolerable limited 4-6xday. Also given thumb opposition activity to be  performing at home for HEP as well. She demo's all back in tolerable planes of motion. She was barely able to tolerate wrist motion today without high nervous pains/yelling out. It may become necessary to provide night/resting orthotics if pain remains high. She states understanding POC and education so far.  ?  ?  ?PATIENT EDUCATION: ?Education details: see tx section above for details  ?Person educated: Patient ?Education method: Explanation, Demonstration, Verbal cues, and Handouts ?Education comprehension:  verbalized understanding, returned demonstration, verbal cues required, and needs further education ?  ?  ?HOME EXERCISE PROGRAM: ?Access Code: [email or web address] ?URL: [email or web address] ?Date: 09/29/2021 ?Prepared by: Benito Mccreedy ?  ?GOALS: ?Goals reviewed with patient? Yes ?  ?SHORT TERM GOALS: (STG required if POC>30 days) ?  ?Pt will obtain protective, custom orthotic. ?Target date: As Needed for continued pains in next 1-2 weeks ?Goal status: INITIAL ?  ?2.  Pt will demo/state understanding of initial HEP to improve pain levels and prerequisite motion. ?Target date: 10/06/21 ?Goal status: INITIAL ?  ?  ?LONG TERM GOALS: ?  ?Pt will improve functional ability by decreased impairment per Quick DASH assessment from 61% to 15% or better, for better quality of life. ?Target date: 11/03/21 ?Goal status: INITIAL ?  ?2.  Pt will improve grip strength in right hand from 17lbs to at least 45lbs for functional use at home and in IADLs. ?Target date: 11/03/21 ?Goal status: INITIAL ?  ?3.  Pt will improve A/ROM in right wrist flexion from (-26*) to at least 45*, to have functional motion for tasks like reach and grasp.  ?Target date: 11/03/21 ?Goal status: INITIAL ?  ?4.  Pt will improve strength in right wrist from 3-/5 MMT to at least 4+/5 MMT to have increased functional ability to carry out selfcare and higher-level homecare tasks with no difficulty. ?Target date: 11/03/21 ?Goal status: INITIAL ?  ?5.  Pt  will decrease pain at rest from 5/10 to 1/10 or better to have better sleep and occupational participation in daily roles. ?Target date: 11/03/21 ?Goal status: INITIAL ?  ?  ?  ?ASSESSMENT: ?  ?CLINICAL IMPRES

## 2021-10-02 ENCOUNTER — Ambulatory Visit (INDEPENDENT_AMBULATORY_CARE_PROVIDER_SITE_OTHER): Payer: Commercial Managed Care - PPO | Admitting: Rehabilitative and Restorative Service Providers"

## 2021-10-02 ENCOUNTER — Encounter: Payer: Self-pay | Admitting: Rehabilitative and Restorative Service Providers"

## 2021-10-02 DIAGNOSIS — M25631 Stiffness of right wrist, not elsewhere classified: Secondary | ICD-10-CM

## 2021-10-02 DIAGNOSIS — R278 Other lack of coordination: Secondary | ICD-10-CM

## 2021-10-02 DIAGNOSIS — M6281 Muscle weakness (generalized): Secondary | ICD-10-CM

## 2021-10-02 DIAGNOSIS — M79641 Pain in right hand: Secondary | ICD-10-CM

## 2021-10-02 NOTE — Therapy (Signed)
?OUTPATIENT OCCUPATIONAL THERAPY TREATMENT NOTE ? ? ?Patient Name: [name] ?MRN: [id number] ?DOB:04/22/1961, 61 y.o., female ?Today's Date: 10/02/2021 ? ?PCP: Chevis Pretty, FNP ?REFERRING PROVIDER: Sherilyn Cooter, MD ? ?END OF SESSION:  ? OT End of Session - 10/02/21 0800   ? ? Visit Number 3   ? Number of Visits 10   ? Date for OT Re-Evaluation 11/03/21   ? OT Start Time 0801   ? OT Stop Time 0844   ? OT Time Calculation (min) 43 min   ? Activity Tolerance Patient tolerated treatment well;No increased pain;Patient limited by pain   ? Behavior During Therapy [facility] for tasks assessed/performed   ? ?  ?  ? ?  ? ? ? ?Past Medical History:  ?Diagnosis Date  ? Abdominal discomfort   ? Chronic abdominal and pelvic pain resulting in significant loss of time from work  ? Anxiety   ? with depression  ? Asthma   ? Chest pain   ? Depression   ? Diabetes mellitus   ? Drug overdose 06/12/2007  ? 60 Naprosyn, psychiatric admission  ? Hyperlipidemia   ? Severe  ? Hypertension   ? Migraines   ? Tremor of both hands   ? on propranolol  ? ?Past Surgical History:  ?Procedure Laterality Date  ? ABDOMINAL HYSTERECTOMY  02/10/2007  ? + lysis of adhesions  ? APPENDECTOMY  1990s  ? ruptured, late 90s  ? BILATERAL OOPHORECTOMY    ?  in 2 surgeries prior to 9/08  ? CARPAL TUNNEL RELEASE Right 08/16/2021  ? Procedure: RIGHT CARPAL TUNNEL RELEASE;  Surgeon: Sherilyn Cooter, MD;  Location: [place];  Service: Orthopedics;  Laterality: Right;  ? COLONOSCOPY  02/09/2010  ?  normal upper endoscopy, single colonic polyp,  ? HERNIA REPAIR    ? VENTRAL HERNIA REPAIR    ? x3  ? ?Patient Active Problem List  ? Diagnosis Date Noted  ? Carpal tunnel syndrome 08/16/2021  ? Numbness and tingling in right hand 07/11/2021  ? Diverticulosis 07/13/2016  ? Morbid obesity ([place]) 03/31/2015  ? Hyperlipidemia   ? Hypothyroidism   ? Hypertension   ? POLYP, COLON 04/13/2010  ? Diabetes ([place]) 04/13/2010  ? Anxiety state 04/13/2010   ? Depression 04/13/2010  ? Asthma 04/13/2010  ? Migraines 04/13/2010  ? ? ?ONSET DATE: 08/16/21 DOS ?  ?REFERRING DIAG: G56.01 (ICD-10-CM) - Carpal tunnel syndrome of right wrist ? ?THERAPY DIAG:  ?Muscle weakness (generalized) ? ?Other lack of coordination ? ?Pain in right hand ? ?Stiffness of right wrist, not elsewhere classified ? ? ?PERTINENT HISTORY: She states still having numbness, tenderness at scar, difficulty putting pressure on palm and holding/lifting objects. She does have DM II and neuropathy issues in her feet. She also has hx of essential tremor for which she takes medications ? ?PRECAUTIONS: ~7 weeks from sx now, PRE ok, recommend to avoid repetitive or heavy lifting/grasping for 4-6 more weeks or as tolerated.  ? ?SUBJECTIVE:  ?She states falling by accident over weekend (yesterday) and her pain spiked. It has come down since then but still exacerbated.  ? ?PAIN:  ?Are you having pain? Yes  ?Rating: 8/10 at ret now  ? ?OBJECTIVE: (All objective assessments below are from initial evaluation on: 09/25/21 unless otherwise specified.)  ? ?HAND DOMINANCE: Left ?  ?ADLs: ?Overall ADLs: Having trouble and pain opening things, holding/using silverware, sleeping, clothing fasteners, etc., as well as significant problems with IADLs.  ?  ?  ?FUNCTIONAL  OUTCOME MEASURES: ?Quick Dash: 61% impairment today  ?  ?UE ROM   Rt hand had ~3cm gap to full fist with AROM at eval but passively was ~1cm gap or less.  ?  ?AROM Right ?09/25/2021  ?Wrist flexion (-26*)  ?Wrist extension 48*  ?Wrist ulnar deviation    ?Wrist radial deviation    ?Wrist pronation 70  ?Wrist supination 80  ?(Blank rows = not tested) ?  ?  ?UE MMT:   NT at eval due to pain but overly weak and scores 3-/5MMT in right hand grossly ?  ?MMT Right ?09/25/2021  ?Shoulder flexion    ?Shoulder abduction    ?Shoulder adduction    ?Shoulder extension    ?Shoulder internal rotation    ?Shoulder external rotation    ?Middle trapezius    ?Lower trapezius     ?Elbow flexion    ?Elbow extension    ?Wrist flexion    ?Wrist extension    ?Wrist ulnar deviation    ?Wrist radial deviation    ?Wrist pronation    ?Wrist supination    ?(Blank rows = not tested) ?  ?HAND FUNCTION: ?Grip strength: Right: 17 lbs; Left: 62 lbs and 3 point pinch: Right: TBD lbs, Left: TBD lbs ?  ?COORDINATION: ?9 Hole Peg test: Right: 34 sec; Left: 27 sec ?  ?SENSATION: ?SWMFT: 3.61 in Rt hand Ulnar and Median Nerves (2.83 in Lt hand globally)  ?  ?EDEMA: very mild swelling about right hand/palm now ?  ?COGNITION: ?Overall cognitive status: Within functional limits for tasks assessed ?  ?  ?OBSERVATIONS: She was flinching in pain and yelling out with only slight wrist motion today, also not able to tolerate direct light touch to sx are without significant pain/flinching today  ?  ?  ?TODAY'S TREATMENT:  ?10/02/21: She starts with desensitization in fluidotherapy for 6 mins due to exacerbation of pain. OT then goes over HEP with her reviews which exercises to do on more painful days. OT also does manual therapy with her, STM, light IASTM about scar and forearm. OT also edu on FMS / in-hand manipulation.  She does with some struggle and dropping the pen, but is challenged. She states feeling better at end of session than when we started.  ? ?09/29/21: OT reviews initial HEP while pt in fluidotherapy for desensitization today 5 mins.  She tolerates LT directly over scar and motion much better- no flinching pain now. She is edu on new, upgraded HEP as below and tolerates light stretches and nerve glides today.  ? ?Exercises ?- Seated Forearm Pronation and Supination AROM  - 4-6 x daily - 1 sets - 10-15 reps ?- Wrist AROM Flexion Extension  - 4-6 x daily - 10-15 reps ?- Seated Wrist Flexion Stretch  - 3-4 x daily - 3 reps - 10 second  hold ?- Wrist Prayer Stretch  - 3-4 x daily - 3-5 reps - 15 sec hold ?- Tendon Glides  - 3-4 x daily - 5- 10 reps - 2-3 seconds hold ?- Median Nerve Glides (2 ways) 3-4 x day    ?  ?PATIENT EDUCATION: ?Education details: see tx section above for details  ?Person educated: Patient ?Education method: Explanation, Demonstration, Verbal cues, and Handouts ?Education comprehension: verbalized understanding, returned demonstration, verbal cues required, and needs further education ?  ?HOME EXERCISE PROGRAM: ?Access Code: [email or web address] ?URL: [email or web address] ?Prepared by: Benito Mccreedy ?  ?GOALS: ?Goals reviewed with patient? Yes ?  ?SHORT TERM GOALS: (STG required if POC>30  days) ?  ?Pt will obtain protective, custom orthotic. ?Target date: As Needed for continued pains in next 1-2 weeks ?Goal status: INITIAL ?  ?2.  Pt will demo/state understanding of initial HEP to improve pain levels and prerequisite motion. ?Target date: 10/06/21 ?Goal status: INITIAL ?  ?  ?LONG TERM GOALS: ?  ?Pt will improve functional ability by decreased impairment per Quick DASH assessment from 61% to 15% or better, for better quality of life. ?Target date: 11/03/21 ?Goal status: INITIAL ?  ?2.  Pt will improve grip strength in right hand from 17lbs to at least 45lbs for functional use at home and in IADLs. ?Target date: 11/03/21 ?Goal status: INITIAL ?  ?3.  Pt will improve A/ROM in right wrist flexion from (-26*) to at least 45*, to have functional motion for tasks like reach and grasp.  ?Target date: 11/03/21 ?Goal status: INITIAL ?  ?4.  Pt will improve strength in right wrist from 3-/5 MMT to at least 4+/5 MMT to have increased functional ability to carry out selfcare and higher-level homecare tasks with no difficulty. ?Target date: 11/03/21 ?Goal status: INITIAL ?  ?5.  Pt will decrease pain at rest from 5/10 to 1/10 or better to have better sleep and occupational participation in daily roles. ?Target date: 11/03/21 ?Goal status: INITIAL ?  ?  ?ASSESSMENT: ?  ?CLINICAL IMPRESSION: ?10/02/21: She had a fall with no apparent major injury, come abrasion to knees, hand largely non-tender, just a bit of a  pain exacerbation and increased guarding. Encouraged to go "light" for 1-2 days, then get back to stretches, etc. ? ?09/29/21: She is doing much bette rafter just a few days of starting HEP. Less tender/painf

## 2021-10-04 ENCOUNTER — Encounter: Payer: Self-pay | Admitting: Rehabilitative and Restorative Service Providers"

## 2021-10-04 ENCOUNTER — Ambulatory Visit (INDEPENDENT_AMBULATORY_CARE_PROVIDER_SITE_OTHER): Payer: Commercial Managed Care - PPO | Admitting: Rehabilitative and Restorative Service Providers"

## 2021-10-04 DIAGNOSIS — M79641 Pain in right hand: Secondary | ICD-10-CM

## 2021-10-04 DIAGNOSIS — M25631 Stiffness of right wrist, not elsewhere classified: Secondary | ICD-10-CM | POA: Diagnosis not present

## 2021-10-04 DIAGNOSIS — R278 Other lack of coordination: Secondary | ICD-10-CM | POA: Diagnosis not present

## 2021-10-04 DIAGNOSIS — M6281 Muscle weakness (generalized): Secondary | ICD-10-CM | POA: Diagnosis not present

## 2021-10-04 NOTE — Therapy (Signed)
?OUTPATIENT OCCUPATIONAL THERAPY TREATMENT NOTE ? ? ?Patient Name: [name] ?MRN: [id number] ?DOB:Nov 03, 1960, 61 y.o., female ?Today's Date: 10/04/2021 ? ?PCP: Chevis Pretty, FNP ?REFERRING PROVIDER: Sherilyn Cooter, MD ? ?END OF SESSION:  ? OT End of Session - 10/04/21 1023   ? ? Visit Number 4   ? Number of Visits 10   ? Date for OT Re-Evaluation 11/03/21   ? OT Start Time 1023   ? OT Stop Time 1101   ? OT Time Calculation (min) 38 min   ? Activity Tolerance Patient tolerated treatment well;No increased pain   ? Behavior During Therapy [facility] for tasks assessed/performed   ? ?  ?  ? ?  ? ? ? ? ?Past Medical History:  ?Diagnosis Date  ? Abdominal discomfort   ? Chronic abdominal and pelvic pain resulting in significant loss of time from work  ? Anxiety   ? with depression  ? Asthma   ? Chest pain   ? Depression   ? Diabetes mellitus   ? Drug overdose 06/12/2007  ? 60 Naprosyn, psychiatric admission  ? Hyperlipidemia   ? Severe  ? Hypertension   ? Migraines   ? Tremor of both hands   ? on propranolol  ? ?Past Surgical History:  ?Procedure Laterality Date  ? ABDOMINAL HYSTERECTOMY  02/10/2007  ? + lysis of adhesions  ? APPENDECTOMY  1990s  ? ruptured, late 90s  ? BILATERAL OOPHORECTOMY    ?  in 2 surgeries prior to 9/08  ? CARPAL TUNNEL RELEASE Right 08/16/2021  ? Procedure: RIGHT CARPAL TUNNEL RELEASE;  Surgeon: Sherilyn Cooter, MD;  Location: [place];  Service: Orthopedics;  Laterality: Right;  ? COLONOSCOPY  02/09/2010  ?  normal upper endoscopy, single colonic polyp,  ? HERNIA REPAIR    ? VENTRAL HERNIA REPAIR    ? x3  ? ?Patient Active Problem List  ? Diagnosis Date Noted  ? Carpal tunnel syndrome 08/16/2021  ? Numbness and tingling in right hand 07/11/2021  ? Diverticulosis 07/13/2016  ? Morbid obesity ([place]) 03/31/2015  ? Hyperlipidemia   ? Hypothyroidism   ? Hypertension   ? POLYP, COLON 04/13/2010  ? Diabetes ([place]) 04/13/2010  ? Anxiety state 04/13/2010  ? Depression  04/13/2010  ? Asthma 04/13/2010  ? Migraines 04/13/2010  ? ? ?ONSET DATE: 08/16/21 DOS ?  ?REFERRING DIAG: G56.01 (ICD-10-CM) - Carpal tunnel syndrome of right wrist ? ?THERAPY DIAG:  ?Muscle weakness (generalized) ? ?Other lack of coordination ? ?Pain in right hand ? ?Stiffness of right wrist, not elsewhere classified ? ? ?PERTINENT HISTORY: She states still having numbness, tenderness at scar, difficulty putting pressure on palm and holding/lifting objects. She does have DM II and neuropathy issues in her feet. She also has hx of essential tremor for which she takes medications ? ?PRECAUTIONS: 7 weeks from sx now, PRE ok, recommend to avoid repetitive or heavy lifting/grasping for 4-6 more weeks or as tolerated.  ? ?SUBJECTIVE:  ?She arrives a bit late today, states still tender to palpation from fall but a little better.  ? ?PAIN:  ?Are you having pain? Yes  ?Rating: 6/10 at ret now  ? ?OBJECTIVE: (All objective assessments below are from initial evaluation on: 09/25/21 unless otherwise specified.)  ? ?HAND DOMINANCE: Left ?  ?ADLs: ?Overall ADLs: Having trouble and pain opening things, holding/using silverware, sleeping, clothing fasteners, etc., as well as significant problems with IADLs.  ?  ?  ?FUNCTIONAL OUTCOME MEASURES: ?Quick Dash: 61% impairment  today  ?  ?UE ROM   Rt hand had ~3cm gap to full fist with AROM at eval but passively was ~1cm gap or less.  ?  ?AROM Right ?09/25/2021  ?Wrist flexion (-26*)  ?Wrist extension 48*  ?Wrist ulnar deviation    ?Wrist radial deviation    ?Wrist pronation 70  ?Wrist supination 80  ?(Blank rows = not tested) ?  ?  ?UE MMT:   NT at eval due to pain but overly weak and scores 3-/5MMT in right hand grossly ?  ?MMT Right ?09/25/2021  ?Shoulder flexion    ?Shoulder abduction    ?Shoulder adduction    ?Shoulder extension    ?Shoulder internal rotation    ?Shoulder external rotation    ?Middle trapezius    ?Lower trapezius    ?Elbow flexion    ?Elbow extension    ?Wrist flexion     ?Wrist extension    ?Wrist ulnar deviation    ?Wrist radial deviation    ?Wrist pronation    ?Wrist supination    ?(Blank rows = not tested) ?  ?HAND FUNCTION: ?Grip strength: Right: 17 lbs; Left: 62 lbs and 3 point pinch: Right: TBD lbs, Left: TBD lbs ?  ?COORDINATION: ?9 Hole Peg test: Right: 34 sec; Left: 27 sec ?  ?SENSATION: ?SWMFT: 3.61 in Rt hand Ulnar and Median Nerves (2.83 in Lt hand globally)  ?  ?EDEMA: very mild swelling about right hand/palm now ?  ?COGNITION: ?Overall cognitive status: Within functional limits for tasks assessed ?  ?  ?OBSERVATIONS: She was flinching in pain and yelling out with only slight wrist motion today, also not able to tolerate direct light touch to sx are without significant pain/flinching today  ?  ?  ?TODAY'S TREATMENT:  ?10/04/21: OT edu on and performs self-care scar mobilizations with dycem material for somewhat adherent scar. She states understanding to add to HEP, tolerates well. OT also does manual stretches for hand and wrist with review of HEP- she tolerates all well. She also upgrades HEP to include new t-putty hand strength in multiple pinch and grip as listed below. She does well with this stating less pain at end!  ? ?Exercises ?- Seated Forearm Pronation and Supination AROM  - 4-6 x daily - 1 sets - 10-15 reps ?- Wrist AROM Flexion Extension  - 4-6 x daily - 10-15 reps ?- Seated Wrist Flexion Stretch  - 3-4 x daily - 3 reps - 10 second  hold ?- Wrist Prayer Stretch  - 3-4 x daily - 3-5 reps - 15 sec hold ?- Tendon Glides  - 3-4 x daily - 5- 10 reps - 2-3 seconds hold ?- Seated Median Nerve Glide  - 3-4 x daily - 5 reps ?- Median Nerve Glide  - 3-4 x daily - 5 reps ?- Full Fist  - 2-3 x daily - 5 reps ?- Seated Claw Fist with Putty  - 2-3 x daily - 5 reps ?- "Duck Mouth" Strength  - 2-3 x daily - 5 reps ?- Finger Extension "Pizza!"   - 2-3 x daily - 5 reps ?- Finger Key Grip with Putty  - 2-3 x daily - 5 reps ?- Thumb Press  - 2-3 x daily - 5 reps ?- Thumb  Opposition with Putty  - 2-3 x daily - 5 reps ?- Finger Pinch and Pull with Putty  - 2-3 x daily - 5 reps ? ?10/02/21: She starts with desensitization in fluidotherapy for 6 mins due to  exacerbation of pain. OT then goes over HEP with her reviews which exercises to do on more painful days. OT also does manual therapy with her, STM, light IASTM about scar and forearm. OT also edu on FMS / in-hand manipulation.  She does with some struggle and dropping the pen, but is challenged. She states feeling better at end of session than when we started.  ? ?09/29/21: OT reviews initial HEP while pt in fluidotherapy for desensitization today 5 mins.  She tolerates LT directly over scar and motion much better- no flinching pain now. She is edu on new, upgraded HEP as below and tolerates light stretches and nerve glides today.  ? ?Exercises ?- Seated Forearm Pronation and Supination AROM  - 4-6 x daily - 1 sets - 10-15 reps ?- Wrist AROM Flexion Extension  - 4-6 x daily - 10-15 reps ?- Seated Wrist Flexion Stretch  - 3-4 x daily - 3 reps - 10 second  hold ?- Wrist Prayer Stretch  - 3-4 x daily - 3-5 reps - 15 sec hold ?- Tendon Glides  - 3-4 x daily - 5- 10 reps - 2-3 seconds hold ?- Median Nerve Glides (2 ways) 3-4 x day   ?  ?PATIENT EDUCATION: ?Education details: see tx section above for details  ?Person educated: Patient ?Education method: Explanation, Demonstration, Verbal cues, and Handouts ?Education comprehension: verbalized understanding, returned demonstration, verbal cues required, and needs further education ?  ?HOME EXERCISE PROGRAM: ?Access Code: [email or web address] ?URL: [email or web address] ?Prepared by: Benito Mccreedy ?  ?GOALS: ?Goals reviewed with patient? Yes ?  ?SHORT TERM GOALS: (STG required if POC>30 days) ?  ?Pt will obtain protective, custom orthotic. ?Target date: As Needed for continued pains in next 1-2 weeks ?Goal status: INITIAL ?  ?2.  Pt will demo/state understanding of initial HEP to improve  pain levels and prerequisite motion. ?Target date: 10/06/21 ?Goal status: INITIAL ?  ?  ?LONG TERM GOALS: ?  ?Pt will improve functional ability by decreased impairment per Quick DASH assessment from 61% to 15% or

## 2021-10-06 ENCOUNTER — Encounter: Payer: Commercial Managed Care - PPO | Admitting: Rehabilitative and Restorative Service Providers"

## 2021-10-06 ENCOUNTER — Ambulatory Visit: Payer: Commercial Managed Care - PPO | Admitting: Nurse Practitioner

## 2021-10-06 ENCOUNTER — Encounter: Payer: Self-pay | Admitting: Nurse Practitioner

## 2021-10-06 VITALS — BP 131/65 | HR 58 | Temp 97.8°F | Resp 20 | Ht 64.0 in | Wt 217.0 lb

## 2021-10-06 DIAGNOSIS — F3341 Major depressive disorder, recurrent, in partial remission: Secondary | ICD-10-CM

## 2021-10-06 DIAGNOSIS — E1140 Type 2 diabetes mellitus with diabetic neuropathy, unspecified: Secondary | ICD-10-CM

## 2021-10-06 DIAGNOSIS — J45909 Unspecified asthma, uncomplicated: Secondary | ICD-10-CM

## 2021-10-06 DIAGNOSIS — Z23 Encounter for immunization: Secondary | ICD-10-CM

## 2021-10-06 DIAGNOSIS — G43411 Hemiplegic migraine, intractable, with status migrainosus: Secondary | ICD-10-CM | POA: Diagnosis not present

## 2021-10-06 DIAGNOSIS — E782 Mixed hyperlipidemia: Secondary | ICD-10-CM

## 2021-10-06 DIAGNOSIS — I10 Essential (primary) hypertension: Secondary | ICD-10-CM

## 2021-10-06 DIAGNOSIS — G43711 Chronic migraine without aura, intractable, with status migrainosus: Secondary | ICD-10-CM

## 2021-10-06 DIAGNOSIS — E1142 Type 2 diabetes mellitus with diabetic polyneuropathy: Secondary | ICD-10-CM

## 2021-10-06 DIAGNOSIS — G43109 Migraine with aura, not intractable, without status migrainosus: Secondary | ICD-10-CM

## 2021-10-06 DIAGNOSIS — F411 Generalized anxiety disorder: Secondary | ICD-10-CM

## 2021-10-06 DIAGNOSIS — E039 Hypothyroidism, unspecified: Secondary | ICD-10-CM

## 2021-10-06 DIAGNOSIS — K5790 Diverticulosis of intestine, part unspecified, without perforation or abscess without bleeding: Secondary | ICD-10-CM

## 2021-10-06 LAB — BAYER DCA HB A1C WAIVED: HB A1C (BAYER DCA - WAIVED): 8.8 % — ABNORMAL HIGH (ref 4.8–5.6)

## 2021-10-06 MED ORDER — BENAZEPRIL HCL 20 MG PO TABS: 20.0000 mg | ORAL_TABLET | Freq: Every day | ORAL | 1 refills | Status: DC

## 2021-10-06 MED ORDER — SITAGLIPTIN PHOS-METFORMIN HCL 50-1000 MG PO TABS: 1.0000 | ORAL_TABLET | Freq: Two times a day (BID) | ORAL | 1 refills | Status: DC

## 2021-10-06 MED ORDER — RIZATRIPTAN BENZOATE 10 MG PO TABS: 10.0000 mg | ORAL_TABLET | ORAL | 2 refills | Status: DC | PRN

## 2021-10-06 MED ORDER — CITALOPRAM HYDROBROMIDE 40 MG PO TABS: 40.0000 mg | ORAL_TABLET | Freq: Every day | ORAL | 1 refills | Status: DC

## 2021-10-06 MED ORDER — TOPIRAMATE 50 MG PO TABS: 50.0000 mg | ORAL_TABLET | Freq: Two times a day (BID) | ORAL | 2 refills | Status: DC

## 2021-10-06 MED ORDER — GLIPIZIDE ER 10 MG PO TB24: 10.0000 mg | ORAL_TABLET | Freq: Every day | ORAL | 1 refills | Status: DC

## 2021-10-06 MED ORDER — FENOFIBRATE 160 MG PO TABS: 160.0000 mg | ORAL_TABLET | Freq: Every day | ORAL | 4 refills | Status: DC

## 2021-10-06 MED ORDER — ATORVASTATIN CALCIUM 40 MG PO TABS: 40.0000 mg | ORAL_TABLET | Freq: Every day | ORAL | 4 refills | Status: DC

## 2021-10-06 MED ORDER — GABAPENTIN 300 MG PO CAPS: 300.0000 mg | ORAL_CAPSULE | Freq: Three times a day (TID) | ORAL | 3 refills | Status: DC

## 2021-10-06 NOTE — Addendum Note (Signed)
Addended by: Rolena Infante on: 10/06/2021 01:58 PM ? ? Modules accepted: Orders ? ?

## 2021-10-06 NOTE — Patient Instructions (Addendum)
Migraine Headache ?A migraine headache is a very strong throbbing pain on one side or both sides of your head. This type of headache can also cause other symptoms. It can last from 4 hours to 3 days. Talk with your doctor about what things may bring on (trigger) this condition. ?What are the causes? ?The exact cause of this condition is not known. This condition may be triggered or caused by: ?Drinking alcohol. ?Smoking. ?Taking medicines, such as: ?Medicine used to treat chest pain (nitroglycerin). ?Birth control pills. ?Estrogen. ?Some blood pressure medicines. ?Eating or drinking certain products. ?Doing physical activity. ?Other things that may trigger a migraine headache include: ?Having a menstrual period. ?Pregnancy. ?Hunger. ?Stress. ?Not getting enough sleep or getting too much sleep. ?Weather changes. ?Tiredness (fatigue). ?What increases the risk? ?Being 25-55 years old. ?Being female. ?Having a family history of migraine headaches. ?Being Caucasian. ?Having depression or anxiety. ?Being very overweight. ?What are the signs or symptoms? ?A throbbing pain. This pain may: ?Happen in any area of the head, such as on one side or both sides. ?Make it hard to do daily activities. ?Get worse with physical activity. ?Get worse around bright lights or loud noises. ?Other symptoms may include: ?Feeling sick to your stomach (nauseous). ?Vomiting. ?Dizziness. ?Being sensitive to bright lights, loud noises, or smells. ?Before you get a migraine headache, you may get warning signs (an aura). An aura may include: ?Seeing flashing lights or having blind spots. ?Seeing bright spots, halos, or zigzag lines. ?Having tunnel vision or blurred vision. ?Having numbness or a tingling feeling. ?Having trouble talking. ?Having weak muscles. ?Some people have symptoms after a migraine headache (postdromal phase), such as: ?Tiredness. ?Trouble thinking (concentrating). ?How is this treated? ?Taking medicines that: ?Relieve  pain. ?Relieve the feeling of being sick to your stomach. ?Prevent migraine headaches. ?Treatment may also include: ?Having acupuncture. ?Avoiding foods that bring on migraine headaches. ?Learning ways to control your body functions (biofeedback). ?Therapy to help you know and deal with negative thoughts (cognitive behavioral therapy). ?Follow these instructions at home: ?Medicines ?Take over-the-counter and prescription medicines only as told by your doctor. ?Ask your doctor if the medicine prescribed to you: ?Requires you to avoid driving or using heavy machinery. ?Can cause trouble pooping (constipation). You may need to take these steps to prevent or treat trouble pooping: ?Drink enough fluid to keep your pee (urine) pale yellow. ?Take over-the-counter or prescription medicines. ?Eat foods that are high in fiber. These include beans, whole grains, and fresh fruits and vegetables. ?Limit foods that are high in fat and sugar. These include fried or sweet foods. ?Lifestyle ?Do not drink alcohol. ?Do not use any products that contain nicotine or tobacco, such as cigarettes, e-cigarettes, and chewing tobacco. If you need help quitting, ask your doctor. ?Get at least 8 hours of sleep every night. ?Limit and deal with stress. ?General instructions ? ?  ? ?Keep a journal to find out what may bring on your migraine headaches. For example, write down: ?What you eat and drink. ?How much sleep you get. ?Any change in what you eat or drink. ?Any change in your medicines. ?If you have a migraine headache: ?Avoid things that make your symptoms worse, such as bright lights. ?It may help to lie down in a dark, quiet room. ?Do not drive or use heavy machinery. ?Ask your doctor what activities are safe for you. ?Keep all follow-up visits as told by your doctor. This is important. ?Contact a doctor if: ?You get a migraine   headache that is different or worse than others you have had. ?You have more than 15 headache days in one  month. ?Get help right away if: ?Your migraine headache gets very bad. ?Your migraine headache lasts longer than 72 hours. ?You have a fever. ?You have a stiff neck. ?You have trouble seeing. ?Your muscles feel weak or like you cannot control them. ?You start to lose your balance a lot. ?You start to have trouble walking. ?You pass out (faint). ?You have a seizure. ?Summary ?A migraine headache is a very strong throbbing pain on one side or both sides of your head. These headaches can also cause other symptoms. ?This condition may be treated with medicines and changes to your lifestyle. ?Keep a journal to find out what may bring on your migraine headaches. ?Contact a doctor if you get a migraine headache that is different or worse than others you have had. ?Contact your doctor if you have more than 15 headache days in a month. ?This information is not intended to replace advice given to you by your health care provider. Make sure you discuss any questions you have with your health care provider. ?Document Revised: 09/19/2018 Document Reviewed: 07/10/2018 ?Elsevier Patient Education ? 2023 Elsevier Inc. ? ?

## 2021-10-06 NOTE — Progress Notes (Signed)
? ?Subjective:  ? ? Patient ID: [name], female    DOB: 1961-06-08, 61 y.o.   MRN: [id number] ? ? ?Chief Complaint: medical management of chronic issues  ?  ? ?HPI: ? ?[name] is a 61 y.o. who identifies as a female who was assigned female at birth.  ? ?Social history: ?Lives with: by herself ?Work history: works at [employer]- is going to start working at [employer]. ? ? ?Comes in today for follow up of the following chronic medical issues: ? ?1. Primary hypertension ?No c/o chest pain, sob or headache.s not check blood pressure at home. ?BP Readings from Last 3 Encounters:  ?08/17/21 (!) 144/57  ?08/01/21 120/70  ?07/11/21 131/82  ? ? ? ?2. Mixed hyperlipidemia ?Does try  to watch diet but does no dedicated exercise. ?Lab Results  ?Component Value Date  ? CHOL 304 (H) 06/30/2021  ? HDL 62 06/30/2021  ? LDLCALC 216 (H) 06/30/2021  ? LDLDIRECT 211 (H) 04/28/2019  ? TRIG 144 06/30/2021  ? CHOLHDL 4.9 (H) 06/30/2021  ? ? ? ?3. Intractable hemiplegic migraine with status migrainosus ?Is on topamax which has really helped cut down how often she has migraine. Uses maxalt when has migraine and that will usually resolve it. She averages around 1-2 a month.  ? ?4. Mild asthma without complication, unspecified whether persistent ?Has not had any issues ? ?5. Diverticulosis ?Does try to watch diet to prevent flare up ? ?6. Type 2 diabetes mellitus with diabetic polyneuropathy, without long-term current use of insulin ([place]) ?Fasting blood sugars are running around 160. She has not been watching her diat. She is on neurontin fo rher neuropathy and that helps a lot. ?Lab Results  ?Component Value Date  ? HGBA1C 6.8 (H) 06/30/2021  ? ? ? ?7. Acquired hypothyroidism ?No problems that she is awareof. ?Lab Results  ?Component Value Date  ? TSH 2.200 06/30/2021  ? ? ? ?8. Anxiety state ?9. Recurrent major depressive disorder, in partial remission ([place]) ?Is on combination of celexa and wellbutrin.  She has been doing really well as of late. ? ?10. Morbid obesity ([place]) ?Weight is up 5lbs ?Wt Readings from Last 3 Encounters:  ?10/06/21 217 lb (98.4 kg)  ?08/16/21 212 lb 15.4 oz (96.6 kg)  ?07/11/21 204 lb (92.5 kg)  ? ?BMI Readings from Last 3 Encounters:  ?10/06/21 37.25 kg/m?  ?08/16/21 36.56 kg/m?  ?07/11/21 35.02 kg/m?  ? ? ? ?New complaints: ?None today ? ?Allergies  ?Allergen Reactions  ? Aspirin Anaphylaxis  ? Bee Venom Anaphylaxis  ? Benadryl [Diphenhydramine Hcl] Anaphylaxis  ? Morphine And Related Shortness Of Breath and Itching  ? Vicks Formula 44 Cough-Cold Pm [Dm-Apap-Cpm] Shortness Of Breath and Rash  ?  Not anaphylaxis  ? ?Outpatient Encounter Medications as of 10/06/2021  ?Medication Sig  ? albuterol (VENTOLIN HFA) 108 (90 Base) MCG/ACT inhaler Inhale 2 puffs into the lungs every 4 (four) hours as needed for wheezing or shortness of breath. (NEEDS TO BE SEEN)  ? atorvastatin (LIPITOR) 40 MG tablet Take 1 tablet (40 mg total) by mouth daily.  ? benazepril (LOTENSIN) 20 MG tablet Take 1 tablet (20 mg total) by mouth daily.  ? buPROPion (WELLBUTRIN XL) 150 MG 24 hr tablet Take 3 tablets (450 mg total) by mouth daily.  ? citalopram (CELEXA) 40 MG tablet Take 1 tablet (40 mg total) by mouth daily.  ? EPINEPHrine 0.3 mg/0.3 mL IJ SOAJ injection Inject 0.3 mg into the muscle as needed  for anaphylaxis.  ? fenofibrate 160 MG tablet Take 1 tablet (160 mg total) by mouth daily.  ? gabapentin (NEURONTIN) 300 MG capsule Take 1 capsule (300 mg total) by mouth 3 (three) times daily.  ? glipiZIDE (GLUCOTROL XL) 10 MG 24 hr tablet Take 1 tablet (10 mg total) by mouth daily.  ? meclizine (ANTIVERT) 25 MG tablet Take 1 tablet (25 mg total) by mouth 2 (two) times daily as needed for dizziness.  ? ondansetron (ZOFRAN) 4 MG tablet Take 1 tablet (4 mg total) by mouth every 8 (eight) hours as needed for nausea or vomiting.  ? propranolol (INDERAL) 40 MG tablet Take 1 tablet (40 mg total) by mouth 2 (two) times daily.  (Needs to be seen before next refill)  ? rizatriptan (MAXALT) 10 MG tablet Take 1 tablet (10 mg total) by mouth as needed for migraine. May repeat in 2 hours if needed  ? sitaGLIPtin-metformin (JANUMET) 50-1000 MG tablet Take 1 tablet by mouth 2 (two) times daily with breakfast and lunch. (Patient taking differently: Take 1 tablet by mouth 1 day or 1 dose.)  ? topiramate (TOPAMAX) 50 MG tablet Take 1 tablet (50 mg total) by mouth 2 (two) times daily.  ? ?No facility-administered encounter medications on file as of 10/06/2021.  ? ? ?Past Surgical History:  ?Procedure Laterality Date  ? ABDOMINAL HYSTERECTOMY  02/10/2007  ? + lysis of adhesions  ? APPENDECTOMY  1990s  ? ruptured, late 90s  ? BILATERAL OOPHORECTOMY    ?  in 2 surgeries prior to 9/08  ? CARPAL TUNNEL RELEASE Right 08/16/2021  ? Procedure: RIGHT CARPAL TUNNEL RELEASE;  Surgeon: Sherilyn Cooter, MD;  Location: [place];  Service: Orthopedics;  Laterality: Right;  ? COLONOSCOPY  02/09/2010  ?  normal upper endoscopy, single colonic polyp,  ? HERNIA REPAIR    ? VENTRAL HERNIA REPAIR    ? x3  ? ? ?No family history on file. ? ? ? ?Controlled substance contract: n/a ? ? ? ? ?Review of Systems  ?Constitutional:  Negative for diaphoresis.  ?Eyes:  Negative for pain.  ?Respiratory:  Negative for shortness of breath.   ?Cardiovascular:  Negative for chest pain, palpitations and leg swelling.  ?Gastrointestinal:  Negative for abdominal pain.  ?Endocrine: Negative for polydipsia.  ?Skin:  Negative for rash.  ?Neurological:  Negative for dizziness, weakness and headaches.  ?Hematological:  Does not bruise/bleed easily.  ?All other systems reviewed and are negative. ? ?   ?Objective:  ? Physical Exam ?Vitals and nursing note reviewed.  ?Constitutional:   ?   General: She is not in acute distress. ?   Appearance: Normal appearance. She is well-developed.  ?HENT:  ?   Head: Normocephalic.  ?   Right Ear: Tympanic membrane normal.  ?   Left Ear: Tympanic  membrane normal.  ?   Nose: Nose normal.  ?   Mouth/Throat:  ?   Mouth: Mucous membranes are moist.  ?Eyes:  ?   Pupils: Pupils are equal, round, and reactive to light.  ?Neck:  ?   Vascular: No carotid bruit or JVD.  ?Cardiovascular:  ?   Rate and Rhythm: Normal rate and regular rhythm.  ?   Heart sounds: Normal heart sounds.  ?Pulmonary:  ?   Effort: Pulmonary effort is normal. No respiratory distress.  ?   Breath sounds: Normal breath sounds. No wheezing or rales.  ?Chest:  ?   Chest wall: No tenderness.  ?Abdominal:  ?  General: Bowel sounds are normal. There is no distension or abdominal bruit.  ?   Palpations: Abdomen is soft. There is no hepatomegaly, splenomegaly, mass or pulsatile mass.  ?   Tenderness: There is no abdominal tenderness.  ?Musculoskeletal:     ?   General: Normal range of motion.  ?   Cervical back: Normal range of motion and neck supple.  ?Lymphadenopathy:  ?   Cervical: No cervical adenopathy.  ?Skin: ?   General: Skin is warm and dry.  ?Neurological:  ?   Mental Status: She is alert and oriented to person, place, and time.  ?   Deep Tendon Reflexes: Reflexes are normal and symmetric.  ?Psychiatric:     ?   Behavior: Behavior normal.     ?   Thought Content: Thought content normal.     ?   Judgment: Judgment normal.  ? ? ?BP 131/65   Pulse (!) 58   Temp 97.8 ?F (36.6 ?C) (Temporal)   Resp 20   Ht _0  (1.626 m)   Wt 217 lb (98.4 kg)   SpO2 98%   BMI 37.25 kg/m?  ? ?Hgba1c 8.8% ? ? ?   ?Assessment & Plan:  ? ?Marylene Land comes in today with chief complaint of Medical Management of Chronic Issues ? ? ?Diagnosis and orders addressed: ? ?1. Primary hypertension ?Low sodiu diet ?- CBC with Differential/Platelet ?- CMP14+EGFR ?- benazepril (LOTENSIN) 20 MG tablet; Take 1 tablet (20 mg total) by mouth daily.  Dispense: 90 tablet; Refill: 1 ? ?2. Mixed hyperlipidemia ?Low fat diet ?- Lipid panel ?- atorvastatin (LIPITOR) 40 MG tablet; Take 1 tablet (40 mg total) by mouth daily.   Dispense: 30 tablet; Refill: 4 ?- fenofibrate 160 MG tablet; Take 1 tablet (160 mg total) by mouth daily.  Dispense: 30 tablet; Refill: 4 ? ?3. Intractable hemiplegic migraine with status migrainosus ?Keep [employer]

## 2021-10-07 LAB — CBC WITH DIFFERENTIAL/PLATELET
Basophils Absolute: 0.1 10*3/uL (ref 0.0–0.2)
Basos: 1 %
EOS (ABSOLUTE): 0.1 10*3/uL (ref 0.0–0.4)
Eos: 2 %
Hematocrit: 36.6 % (ref 34.0–46.6)
Hemoglobin: 12 g/dL (ref 11.1–15.9)
Immature Grans (Abs): 0 10*3/uL (ref 0.0–0.1)
Immature Granulocytes: 1 %
Lymphocytes Absolute: 2.3 10*3/uL (ref 0.7–3.1)
Lymphs: 36 %
MCH: 28 pg (ref 26.6–33.0)
MCHC: 32.8 g/dL (ref 31.5–35.7)
MCV: 86 fL (ref 79–97)
Monocytes Absolute: 0.4 10*3/uL (ref 0.1–0.9)
Monocytes: 6 %
Neutrophils Absolute: 3.5 10*3/uL (ref 1.4–7.0)
Neutrophils: 54 %
Platelets: 222 10*3/uL (ref 150–450)
RBC: 4.28 x10E6/uL (ref 3.77–5.28)
RDW: 13.2 % (ref 11.7–15.4)
WBC: 6.4 10*3/uL (ref 3.4–10.8)

## 2021-10-07 LAB — CMP14+EGFR
ALT: 21 IU/L (ref 0–32)
AST: 14 IU/L (ref 0–40)
Albumin/Globulin Ratio: 1.6 (ref 1.2–2.2)
Albumin: 4.4 g/dL (ref 3.8–4.8)
Alkaline Phosphatase: 76 IU/L (ref 44–121)
BUN/Creatinine Ratio: 21 (ref 12–28)
BUN: 20 mg/dL (ref 8–27)
Bilirubin Total: 0.3 mg/dL (ref 0.0–1.2)
CO2: 24 mmol/L (ref 20–29)
Calcium: 10 mg/dL (ref 8.7–10.3)
Chloride: 100 mmol/L (ref 96–106)
Creatinine, Ser: 0.94 mg/dL (ref 0.57–1.00)
Globulin, Total: 2.7 g/dL (ref 1.5–4.5)
Glucose: 229 mg/dL — ABNORMAL HIGH (ref 70–99)
Potassium: 5.1 mmol/L (ref 3.5–5.2)
Sodium: 136 mmol/L (ref 134–144)
Total Protein: 7.1 g/dL (ref 6.0–8.5)
eGFR: 69 mL/min/{1.73_m2} (ref >59)

## 2021-10-07 LAB — LIPID PANEL
Chol/HDL Ratio: 7.3 ratio — ABNORMAL HIGH (ref 0.0–4.4)
Cholesterol, Total: 382 mg/dL — ABNORMAL HIGH (ref 100–199)
HDL: 52 mg/dL (ref >39)
LDL Chol Calc (NIH): 270 mg/dL — ABNORMAL HIGH (ref 0–99)
Triglycerides: 275 mg/dL — ABNORMAL HIGH (ref 0–149)
VLDL Cholesterol Cal: 60 mg/dL — ABNORMAL HIGH (ref 5–40)

## 2021-10-07 LAB — MICROALBUMIN / CREATININE URINE RATIO
Creatinine, Urine: 77.6 mg/dL
Microalb/Creat Ratio: 27 mg/g creat (ref 0–29)
Microalbumin, Urine: 21 ug/mL

## 2021-10-09 ENCOUNTER — Encounter: Payer: Commercial Managed Care - PPO | Admitting: Rehabilitative and Restorative Service Providers"

## 2021-10-09 NOTE — Therapy (Incomplete)
?OUTPATIENT OCCUPATIONAL THERAPY TREATMENT NOTE ? ? ?Patient Name: [name] ?MRN: [id number] ?DOB:July 01, 1960, 61 y.o., female ?Today's Date: 10/09/2021 ? ?PCP: Chevis Pretty, FNP ?REFERRING PROVIDER: Sherilyn Cooter, MD ? ?END OF SESSION:  ? ? ? ? ? ?Past Medical History:  ?Diagnosis Date  ? Abdominal discomfort   ? Chronic abdominal and pelvic pain resulting in significant loss of time from work  ? Anxiety   ? with depression  ? Asthma   ? Chest pain   ? Depression   ? Diabetes mellitus   ? Drug overdose 06/12/2007  ? 60 Naprosyn, psychiatric admission  ? Hyperlipidemia   ? Severe  ? Hypertension   ? Migraines   ? Tremor of both hands   ? on propranolol  ? ?Past Surgical History:  ?Procedure Laterality Date  ? ABDOMINAL HYSTERECTOMY  02/10/2007  ? + lysis of adhesions  ? APPENDECTOMY  1990s  ? ruptured, late 90s  ? BILATERAL OOPHORECTOMY    ?  in 2 surgeries prior to 9/08  ? CARPAL TUNNEL RELEASE Right 08/16/2021  ? Procedure: RIGHT CARPAL TUNNEL RELEASE;  Surgeon: Sherilyn Cooter, MD;  Location: [place];  Service: Orthopedics;  Laterality: Right;  ? COLONOSCOPY  02/09/2010  ?  normal upper endoscopy, single colonic polyp,  ? HERNIA REPAIR    ? VENTRAL HERNIA REPAIR    ? x3  ? ?Patient Active Problem List  ? Diagnosis Date Noted  ? Carpal tunnel syndrome 08/16/2021  ? Numbness and tingling in right hand 07/11/2021  ? Diverticulosis 07/13/2016  ? Morbid obesity ([place]) 03/31/2015  ? Hyperlipidemia   ? Hypothyroidism   ? Hypertension   ? POLYP, COLON 04/13/2010  ? Diabetes ([place]) 04/13/2010  ? Anxiety state 04/13/2010  ? Depression 04/13/2010  ? Asthma 04/13/2010  ? Migraines 04/13/2010  ? ? ?ONSET DATE: 08/16/21 DOS ?  ?REFERRING DIAG: G56.01 (ICD-10-CM) - Carpal tunnel syndrome of right wrist ? ?THERAPY DIAG:  ?No diagnosis found. ? ? ?PERTINENT HISTORY: She states still having numbness, tenderness at scar, difficulty putting pressure on palm and holding/lifting objects. She does  have DM II and neuropathy issues in her feet. She also has hx of essential tremor for which she takes medications ? ?PRECAUTIONS: 7 weeks from sx now, PRE ok, recommend to avoid repetitive or heavy lifting/grasping for 4-6 more weeks or as tolerated.  ? ?SUBJECTIVE:  ?*** ? ?She arrives a bit late today, states still tender to palpation from fall but a little better.  ? ?PAIN:  ?Are you having pain? Yes *** ?Rating: 6/10 at ret now  ? ?OBJECTIVE: (All objective assessments below are from initial evaluation on: 09/25/21 unless otherwise specified.)  ? ?HAND DOMINANCE: Left ?  ?ADLs: ?Overall ADLs: Having trouble and pain opening things, holding/using silverware, sleeping, clothing fasteners, etc., as well as significant problems with IADLs.  ?  ?  ?FUNCTIONAL OUTCOME MEASURES: ?Quick Dash: 61% impairment today  ?  ?UE ROM   Rt hand had ~3cm gap to full fist with AROM at eval but passively was ~1cm gap or less.  ?  ?AROM Right ?09/25/2021  ?Wrist flexion (-26*)  ?Wrist extension 48*  ?Wrist ulnar deviation    ?Wrist radial deviation    ?Wrist pronation 70  ?Wrist supination 80  ?(Blank rows = not tested) ?  ?  ?UE MMT:   NT at eval due to pain but overly weak and scores 3-/5MMT in right hand grossly ?  ?MMT Right ?09/25/2021  ?Shoulder flexion    ?  Shoulder abduction    ?Shoulder adduction    ?Shoulder extension    ?Shoulder internal rotation    ?Shoulder external rotation    ?Middle trapezius    ?Lower trapezius    ?Elbow flexion    ?Elbow extension    ?Wrist flexion    ?Wrist extension    ?Wrist ulnar deviation    ?Wrist radial deviation    ?Wrist pronation    ?Wrist supination    ?(Blank rows = not tested) ?  ?HAND FUNCTION: ?Grip strength: Right: 17 lbs; Left: 62 lbs and 3 point pinch: Right: TBD lbs, Left: TBD lbs ?  ?COORDINATION: ?9 Hole Peg test: Right: 34 sec; Left: 27 sec ?  ?SENSATION: ?SWMFT: 3.61 in Rt hand Ulnar and Median Nerves (2.83 in Lt hand globally)  ?  ?EDEMA: very mild swelling about right hand/palm  now ?  ?COGNITION: ?Overall cognitive status: Within functional limits for tasks assessed ?  ?  ?OBSERVATIONS: She was flinching in pain and yelling out with only slight wrist motion today, also not able to tolerate direct light touch to sx are without significant pain/flinching today  ?  ?  ?TODAY'S TREATMENT:  ?10/09/21: *** ? ?10/04/21: OT edu on and performs self-care scar mobilizations with dycem material for somewhat adherent scar. She states understanding to add to HEP, tolerates well. OT also does manual stretches for hand and wrist with review of HEP- she tolerates all well. She also upgrades HEP to include new t-putty hand strength in multiple pinch and grip as listed below. She does well with this stating less pain at end!  ? ?Exercises ?- Seated Forearm Pronation and Supination AROM  - 4-6 x daily - 1 sets - 10-15 reps ?- Wrist AROM Flexion Extension  - 4-6 x daily - 10-15 reps ?- Seated Wrist Flexion Stretch  - 3-4 x daily - 3 reps - 10 second  hold ?- Wrist Prayer Stretch  - 3-4 x daily - 3-5 reps - 15 sec hold ?- Tendon Glides  - 3-4 x daily - 5- 10 reps - 2-3 seconds hold ?- Seated Median Nerve Glide  - 3-4 x daily - 5 reps ?- Median Nerve Glide  - 3-4 x daily - 5 reps ?- Full Fist  - 2-3 x daily - 5 reps ?- Seated Claw Fist with Putty  - 2-3 x daily - 5 reps ?- "Duck Mouth" Strength  - 2-3 x daily - 5 reps ?- Finger Extension "Pizza!"   - 2-3 x daily - 5 reps ?- Finger Key Grip with Putty  - 2-3 x daily - 5 reps ?- Thumb Press  - 2-3 x daily - 5 reps ?- Thumb Opposition with Putty  - 2-3 x daily - 5 reps ?- Finger Pinch and Pull with Putty  - 2-3 x daily - 5 reps ? ?10/02/21: She starts with desensitization in fluidotherapy for 6 mins due to exacerbation of pain. OT then goes over HEP with her reviews which exercises to do on more painful days. OT also does manual therapy with her, STM, light IASTM about scar and forearm. OT also edu on FMS / in-hand manipulation.  She does with some struggle and  dropping the pen, but is challenged. She states feeling better at end of session than when we started.  ? ?09/29/21: OT reviews initial HEP while pt in fluidotherapy for desensitization today 5 mins.  She tolerates LT directly over scar and motion much better- no flinching pain now. She is  edu on new, upgraded HEP as below and tolerates light stretches and nerve glides today.  ? ?Exercises ?- Seated Forearm Pronation and Supination AROM  - 4-6 x daily - 1 sets - 10-15 reps ?- Wrist AROM Flexion Extension  - 4-6 x daily - 10-15 reps ?- Seated Wrist Flexion Stretch  - 3-4 x daily - 3 reps - 10 second  hold ?- Wrist Prayer Stretch  - 3-4 x daily - 3-5 reps - 15 sec hold ?- Tendon Glides  - 3-4 x daily - 5- 10 reps - 2-3 seconds hold ?- Median Nerve Glides (2 ways) 3-4 x day   ?  ?PATIENT EDUCATION: ?Education details: see tx section above for details  ?Person educated: Patient ?Education method: Explanation, Demonstration, Verbal cues, and Handouts ?Education comprehension: verbalized understanding, returned demonstration, verbal cues required, and needs further education ?  ?HOME EXERCISE PROGRAM: ?Access Code: [email or web address] ?URL: [email or web address] ?Prepared by: Benito Mccreedy ?  ?GOALS: ?Goals reviewed with patient? Yes ?  ?SHORT TERM GOALS: (STG required if POC>30 days) ?  ?Pt will obtain protective, custom orthotic. ?Target date: As Needed for continued pains in next 1-2 weeks ?Goal status: INITIAL ?  ?2.  Pt will demo/state understanding of initial HEP to improve pain levels and prerequisite motion. ?Target date: 10/06/21 ?Goal status: INITIAL ?  ?  ?LONG TERM GOALS: ?  ?Pt will improve functional ability by decreased impairment per Quick DASH assessment from 61% to 15% or better, for better quality of life. ?Target date: 11/03/21 ?Goal status: INITIAL ?  ?2.  Pt will improve grip strength in right hand from 17lbs to at least 45lbs for functional use at home and in IADLs. ?Target date: 11/03/21 ?Goal  status: INITIAL ?  ?3.  Pt will improve A/ROM in right wrist flexion from (-26*) to at least 45*, to have functional motion for tasks like reach and grasp.  ?Target date: 11/03/21 ?Goal status: INITIAL ?  ?4.  Pt

## 2021-10-12 ENCOUNTER — Encounter: Payer: Self-pay | Admitting: Rehabilitative and Restorative Service Providers"

## 2021-10-12 ENCOUNTER — Other Ambulatory Visit: Payer: Self-pay

## 2021-10-12 ENCOUNTER — Ambulatory Visit (INDEPENDENT_AMBULATORY_CARE_PROVIDER_SITE_OTHER): Payer: Commercial Managed Care - PPO | Admitting: Rehabilitative and Restorative Service Providers"

## 2021-10-12 DIAGNOSIS — M25631 Stiffness of right wrist, not elsewhere classified: Secondary | ICD-10-CM

## 2021-10-12 DIAGNOSIS — M6281 Muscle weakness (generalized): Secondary | ICD-10-CM | POA: Diagnosis not present

## 2021-10-12 DIAGNOSIS — R278 Other lack of coordination: Secondary | ICD-10-CM

## 2021-10-12 DIAGNOSIS — M79641 Pain in right hand: Secondary | ICD-10-CM | POA: Diagnosis not present

## 2021-10-12 NOTE — Therapy (Signed)
?OUTPATIENT OCCUPATIONAL THERAPY TREATMENT NOTE ? ? ?Patient Name: [name] ?MRN: [id number] ?DOB:04-Jul-1960, 61 y.o., female ?Today's Date: 10/12/2021 ? ?PCP: Chevis Pretty, FNP ?REFERRING PROVIDER: Sherilyn Cooter, MD ? ?END OF SESSION:  ? OT End of Session - 10/12/21 0759   ? ? Visit Number [id number]   ? Number of Visits 10   ? Date for OT Re-Evaluation 11/03/21   ? OT Start Time 0800   ? OT Stop Time 0845   ? OT Time Calculation (min) 45 min   ? Activity Tolerance Patient tolerated treatment well;No increased pain;Patient limited by pain   ? Behavior During Therapy [facility] for tasks assessed/performed   ? ?  ?  ? ?  ? ? ? ? ? ?Past Medical History:  ?Diagnosis Date  ? Abdominal discomfort   ? Chronic abdominal and pelvic pain resulting in significant loss of time from work  ? Anxiety   ? with depression  ? Asthma   ? Chest pain   ? Depression   ? Diabetes mellitus   ? Drug overdose 06/12/2007  ? 60 Naprosyn, psychiatric admission  ? Hyperlipidemia   ? Severe  ? Hypertension   ? Migraines   ? Tremor of both hands   ? on propranolol  ? ?Past Surgical History:  ?Procedure Laterality Date  ? ABDOMINAL HYSTERECTOMY  02/10/2007  ? + lysis of adhesions  ? APPENDECTOMY  1990s  ? ruptured, late 90s  ? BILATERAL OOPHORECTOMY    ?  in 2 surgeries prior to 9/08  ? CARPAL TUNNEL RELEASE Right 08/16/2021  ? Procedure: RIGHT CARPAL TUNNEL RELEASE;  Surgeon: Sherilyn Cooter, MD;  Location: [place];  Service: Orthopedics;  Laterality: Right;  ? COLONOSCOPY  02/09/2010  ?  normal upper endoscopy, single colonic polyp,  ? HERNIA REPAIR    ? VENTRAL HERNIA REPAIR    ? x3  ? ?Patient Active Problem List  ? Diagnosis Date Noted  ? Carpal tunnel syndrome 08/16/2021  ? Numbness and tingling in right hand 07/11/2021  ? Diverticulosis 07/13/2016  ? Morbid obesity ([place]) 03/31/2015  ? Hyperlipidemia   ? Hypothyroidism   ? Hypertension   ? POLYP, COLON 04/13/2010  ? Diabetes ([place]) 04/13/2010  ? Anxiety state  04/13/2010  ? Depression 04/13/2010  ? Asthma 04/13/2010  ? Migraines 04/13/2010  ? ? ?ONSET DATE: 08/16/21 DOS ?  ?REFERRING DIAG: G56.01 (ICD-10-CM) - Carpal tunnel syndrome of right wrist ? ?THERAPY DIAG:  ?Other lack of coordination ? ?Pain in right hand ? ?Muscle weakness (generalized) ? ?Stiffness of right wrist, not elsewhere classified ? ? ?PERTINENT HISTORY: She states still having numbness, tenderness at scar, difficulty putting pressure on palm and holding/lifting objects. She does have DM II and neuropathy issues in her feet. She also has hx of essential tremor for which she takes medications ? ?PRECAUTIONS: 8 weeks from sx now, PRE ok, recommend to avoid repetitive or heavy lifting/grasping for 4-6 more weeks or as tolerated.  ? ?SUBJECTIVE:  ?She states missing last appt bc feeling sick after a shingles vaccine. She has been doing well otherwise. She states primarily working with putty now and feeling a little tingle in median N distribution.  ? ?PAIN:  ?Are you having pain? Yes, mild tingling /numbness in finger tips reported today  ?Rating: 1-2/10 at ret now  ? ?OBJECTIVE: (All objective assessments below are from initial evaluation on: 09/25/21 unless otherwise specified.)  ? ?HAND DOMINANCE: Left ?  ?ADLs: ?Overall ADLs: Having trouble  and pain opening things, holding/using silverware, sleeping, clothing fasteners, etc., as well as significant problems with IADLs.  ?  ?  ?FUNCTIONAL OUTCOME MEASURES: ?Quick Dash: 61% impairment today  ?  ?UE ROM   Rt hand had ~3cm gap to full fist with AROM at eval but passively was ~1cm gap or less.  ?  ?AROM Right ?09/25/2021 Right ?10/12/21  ?Wrist flexion (-26*) 50* (57* on left side)  ?Wrist extension 48* 58*  ?Wrist ulnar deviation     ?Wrist radial deviation     ?Wrist pronation 70   ?Wrist supination 80   ?(Blank rows = not tested) ?  ?  ?UE MMT:   NT at eval due to pain but overly weak and scores 3-/5MMT in right hand grossly ?  ?MMT Right ?10/12/2021  ?Elbow  flexion  5/5  ?Elbow extension 4+/5   ?Wrist flexion 4/5 pain   ?Wrist extension 5/5   ?Wrist ulnar deviation    ?Wrist radial deviation    ?Wrist pronation 4/5 pain   ?Wrist supination 4+/5 no pain   ?(Blank rows = not tested) ?  ?HAND FUNCTION: ?Grip strength: Right: 17 lbs; Left: 62 lbs and  ? ?10/12/21: Grip strength: Right: 28 lbs; Left: 70 lbs; 3 point pinch: Right: 7 lbs, Left: 12 lbs ?  ?COORDINATION: ?9 Hole Peg test: Right: 34 sec; Left: 27 sec ?  ?SENSATION: ?09/25/21: SWMFT: 3.61 in Rt hand Ulnar and Median Nerves (2.83 in Lt hand globally)  ?10/12/21: SWMFT: 3.61 in Rt hand Ulnar and Median Nerves (thumb is 2.83 today though- better)  (2.83 in Lt hand globally)  ?  ?EDEMA: very mild swelling about right hand/palm now ?  ?COGNITION: ?Overall cognitive status: Within functional limits for tasks assessed ?  ?  ?OBSERVATIONS: She was flinching in pain and yelling out with only slight wrist motion today, also not able to tolerate direct light touch to sx are without significant pain/flinching today  ?  ?  ?TODAY'S TREATMENT:  ?10/12/21: She does AROM and strength for measures and is doing much better but has weakness and pain in wrist flexion and supination. She is re-edu to stretch these again (as she states stopping), she also reviews tendon glides, nerve health with nerve glides, and putty pinches and grips (upgrades to green now). She also upgrades to wrist and FA strength with hammer in wrist flexion and pronation strength. She does well with all no added pain. OT also does manual stretches for her at wrist and FA today, she states feeling better.  ? ?10/04/21: OT edu on and performs self-care scar mobilizations with dycem material for somewhat adherent scar. She states understanding to add to HEP, tolerates well. OT also does manual stretches for hand and wrist with review of HEP- she tolerates all well. She also upgrades HEP to include new t-putty hand strength in multiple pinch and grip as listed below. She  does well with this stating less pain at end!  ? ?Exercises ?- Seated Forearm Pronation and Supination AROM  - 4-6 x daily - 1 sets - 10-15 reps ?- Wrist AROM Flexion Extension  - 4-6 x daily - 10-15 reps ?- Seated Wrist Flexion Stretch  - 3-4 x daily - 3 reps - 10 second  hold ?- Wrist Prayer Stretch  - 3-4 x daily - 3-5 reps - 15 sec hold ?- Tendon Glides  - 3-4 x daily - 5- 10 reps - 2-3 seconds hold ?- Seated Median Nerve Glide  - 3-4  x daily - 5 reps ?- Median Nerve Glide  - 3-4 x daily - 5 reps ?- Full Fist  - 2-3 x daily - 5 reps ?- Seated Claw Fist with Putty  - 2-3 x daily - 5 reps ?- "Duck Mouth" Strength  - 2-3 x daily - 5 reps ?- Finger Extension "Pizza!"   - 2-3 x daily - 5 reps ?- Finger Key Grip with Putty  - 2-3 x daily - 5 reps ?- Thumb Press  - 2-3 x daily - 5 reps ?- Thumb Opposition with Putty  - 2-3 x daily - 5 reps ?- Finger Pinch and Pull with Putty  - 2-3 x daily - 5 reps ? ?10/02/21: She starts with desensitization in fluidotherapy for 6 mins due to exacerbation of pain. OT then goes over HEP with her reviews which exercises to do on more painful days. OT also does manual therapy with her, STM, light IASTM about scar and forearm. OT also edu on FMS / in-hand manipulation.  She does with some struggle and dropping the pen, but is challenged. She states feeling better at end of session than when we started.  ? ?09/29/21: OT reviews initial HEP while pt in fluidotherapy for desensitization today 5 mins.  She tolerates LT directly over scar and motion much better- no flinching pain now. She is edu on new, upgraded HEP as below and tolerates light stretches and nerve glides today.  ? ?Exercises ?- Seated Forearm Pronation and Supination AROM  - 4-6 x daily - 1 sets - 10-15 reps ?- Wrist AROM Flexion Extension  - 4-6 x daily - 10-15 reps ?- Seated Wrist Flexion Stretch  - 3-4 x daily - 3 reps - 10 second  hold ?- Wrist Prayer Stretch  - 3-4 x daily - 3-5 reps - 15 sec hold ?- Tendon Glides  - 3-4 x  daily - 5- 10 reps - 2-3 seconds hold ?- Median Nerve Glides (2 ways) 3-4 x day   ?  ?PATIENT EDUCATION: ?Education details: see tx section above for details  ?Person educated: Patient ?Education method: [employer]

## 2021-10-17 ENCOUNTER — Encounter: Payer: Self-pay | Admitting: Rehabilitative and Restorative Service Providers"

## 2021-10-17 ENCOUNTER — Ambulatory Visit (INDEPENDENT_AMBULATORY_CARE_PROVIDER_SITE_OTHER): Payer: Commercial Managed Care - PPO | Admitting: Rehabilitative and Restorative Service Providers"

## 2021-10-17 DIAGNOSIS — M79641 Pain in right hand: Secondary | ICD-10-CM

## 2021-10-17 DIAGNOSIS — M25631 Stiffness of right wrist, not elsewhere classified: Secondary | ICD-10-CM | POA: Diagnosis not present

## 2021-10-17 DIAGNOSIS — R278 Other lack of coordination: Secondary | ICD-10-CM | POA: Diagnosis not present

## 2021-10-17 DIAGNOSIS — M6281 Muscle weakness (generalized): Secondary | ICD-10-CM

## 2021-10-17 NOTE — Therapy (Signed)
?OUTPATIENT OCCUPATIONAL THERAPY TREATMENT NOTE ? ? ?Patient Name: [name] ?MRN: [id number] ?DOB:09-14-1960, 61 y.o., female ?Today's Date: 10/17/2021 ? ?PCP: Chevis Pretty, FNP ?REFERRING PROVIDER: Sherilyn Cooter, MD ? ?END OF SESSION:  ? OT End of Session - 10/17/21 0847   ? ? Visit Number 6   ? Number of Visits 10   ? Date for OT Re-Evaluation 11/03/21   ? OT Start Time [phone]   ? OT Stop Time 0930   ? OT Time Calculation (min) 43 min   ? Activity Tolerance Patient tolerated treatment well;No increased pain;Patient limited by pain   ? Behavior During Therapy [facility] for tasks assessed/performed   ? ?  ?  ? ?  ? ? ? ? ? ? ?Past Medical History:  ?Diagnosis Date  ? Abdominal discomfort   ? Chronic abdominal and pelvic pain resulting in significant loss of time from work  ? Anxiety   ? with depression  ? Asthma   ? Chest pain   ? Depression   ? Diabetes mellitus   ? Drug overdose 06/12/2007  ? 60 Naprosyn, psychiatric admission  ? Hyperlipidemia   ? Severe  ? Hypertension   ? Migraines   ? Tremor of both hands   ? on propranolol  ? ?Past Surgical History:  ?Procedure Laterality Date  ? ABDOMINAL HYSTERECTOMY  02/10/2007  ? + lysis of adhesions  ? APPENDECTOMY  1990s  ? ruptured, late 90s  ? BILATERAL OOPHORECTOMY    ?  in 2 surgeries prior to 9/08  ? CARPAL TUNNEL RELEASE Right 08/16/2021  ? Procedure: RIGHT CARPAL TUNNEL RELEASE;  Surgeon: Sherilyn Cooter, MD;  Location: [place];  Service: Orthopedics;  Laterality: Right;  ? COLONOSCOPY  02/09/2010  ?  normal upper endoscopy, single colonic polyp,  ? HERNIA REPAIR    ? VENTRAL HERNIA REPAIR    ? x3  ? ?Patient Active Problem List  ? Diagnosis Date Noted  ? Carpal tunnel syndrome 08/16/2021  ? Numbness and tingling in right hand 07/11/2021  ? Diverticulosis 07/13/2016  ? Morbid obesity ([place]) 03/31/2015  ? Hyperlipidemia   ? Hypothyroidism   ? Hypertension   ? POLYP, COLON 04/13/2010  ? Diabetes ([place]) 04/13/2010  ? Anxiety state  04/13/2010  ? Depression 04/13/2010  ? Asthma 04/13/2010  ? Migraines 04/13/2010  ? ? ?ONSET DATE: 08/16/21 DOS ?  ?REFERRING DIAG: G56.01 (ICD-10-CM) - Carpal tunnel syndrome of right wrist ? ?THERAPY DIAG:  ?Other lack of coordination ? ?Pain in right hand ? ?Muscle weakness (generalized) ? ?Stiffness of right wrist, not elsewhere classified ? ? ?PERTINENT HISTORY: She states still having numbness, tenderness at scar, difficulty putting pressure on palm and holding/lifting objects. She does have DM II and neuropathy issues in her feet. She also has hx of essential tremor for which she takes medications ? ?PRECAUTIONS: 9 weeks from sx now, PRE ok, recommend to avoid repetitive or heavy lifting/grasping for 4-6 more weeks or as tolerated.  ? ?SUBJECTIVE:  ?She states having increased sharp pains upon touch in wrist/fingers now. 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  ? ?PAIN:  ?Are you having pain? Yes, still mild at rest  ?Rating: 1-2/10 at rest now, up to 7/10 with using hand in past few days ? ?OBJECTIVE: (All objective assessments below are from initial evaluation on: 09/25/21  unless otherwise specified.)  ? ?HAND DOMINANCE: Left ?  ?ADLs: ?Overall ADLs: Having trouble and pain opening things, holding/using silverware, sleeping, clothing fasteners, etc., as well as significant problems with IADLs.  ?  ?  ?FUNCTIONAL OUTCOME MEASURES: ?Quick Dash: 61% impairment today  ?  ?UE ROM   Rt hand had ~3cm gap to full fist with AROM at eval but passively was ~1cm gap or less.  ?  ?AROM Right ?09/25/2021 Right ?10/12/21  ?Wrist flexion (-26*) 50* (57* on left side)  ?Wrist extension 48* 58*  ?Wrist ulnar deviation     ?Wrist radial deviation     ?Wrist pronation 70   ?Wrist supination 80   ?(Blank rows = not tested) ?  ?  ?UE MMT:   NT at eval due to pain but overly weak and scores 3-/5MMT in  right hand grossly ?  ?MMT Right ?10/12/2021  ?Elbow flexion  5/5  ?Elbow extension 4+/5   ?Wrist flexion 4/5 pain   ?Wrist extension 5/5   ?Wrist ulnar deviation    ?Wrist radial deviation    ?Wrist pronation 4/5 pain   ?Wrist supination 4+/5 no pain   ?(Blank rows = not tested) ?  ?HAND FUNCTION: ?Grip strength: Right: 17 lbs; Left: 62 lbs and  ? ?10/12/21: Grip strength: Right: 28 lbs; Left: 70 lbs; 3 point pinch: Right: 7 lbs, Left: 12 lbs ?  ?COORDINATION: ?9 Hole Peg test: Right: 34 sec; Left: 27 sec ?  ?SENSATION: ?09/25/21: SWMFT: 3.61 in Rt hand Ulnar and Median Nerves (2.83 in Lt hand globally)  ? ?10/12/21: SWMFT: 3.61 in Rt hand Ulnar and Median Nerves (thumb is 2.83 today though- better)  (2.83 in Lt hand globally)  ? ?10/17/21: SWMFT Rt hand: 3.61 in whole hand today.  ?  ?EDEMA: very mild swelling about right hand/palm now ?  ?COGNITION: ?Overall cognitive status: Within functional limits for tasks assessed ?  ?  ?OBSERVATIONS: She was flinching in pain and yelling out with only slight wrist motion today, also not able to tolerate direct light touch to sx are without significant pain/flinching today  ?  ?  ?TODAY'S TREATMENT:  ?10/17/21: Due to high anxiety and increasing pain reports, OT educates on nerves and pain perception. We discussed the close link between stress and pain perception for neuromuscular re-education, and OT encourages her to find some stress relief. OT also talks about daily planning skills as she has been out of work for a while now and should have been planning for this eventuality. She was encouraged to do work that doesn't hurt her, and make plans financially, etc.  OT also educates on specific HEP to perform when pain is increased (for whatever reason) including "backing off" to light AROM, desensitization, and possibly light putty (yellow), light stretches at wrist and nerve glides as tolerated.  She was also edu to step back up to more aggressive program after 1-2 of decreased pain  from exacerbation. Additionally she asks about safety during moving, and OT recommends to warmup and stretch first, avoid carrying palm up and putting pressure over CTR, pace herself, etc. She states understanding.  OT also does manual therapy: IASTM, manual desensitization and light stretches at wrist today in flex, ext. She states feeling a little better at end of session.  ? ? ?10/12/21: She does AROM and strength for measures and is doing much better but has weakness and pain in wrist flexion and supination. She is re-edu to stretch these again (as she states  stopping), she also reviews tendon glides, nerve health with nerve glides, and putty pinches and grips (upgrades to green now). She also upgrades to wrist and FA strength with hammer in wrist flexion and pronation strength. She does well with all no added pain. OT also does manual stretches for her at wrist and FA today, she states feeling better.  ?  ?PATIENT EDUCATION: ?Education details: see tx section above for details  ?Person educated: Patient ?Education method: Explanation, Demonstration, Verbal cues, and Handouts ?Education comprehension: verbalized understanding, returned demonstration, verbal cues required, and needs further education ?  ?HOME EXERCISE PROGRAM: ?Access Code: [email or web address] ?URL: [email or web address] ?Prepared by: Benito Mccreedy ?  ?GOALS: ?Goals reviewed with patient? Yes ?  ?SHORT TERM GOALS: (STG required if POC>30 days) ?  ?Pt will obtain protective, custom orthotic. ?Target date: As Needed for continued pains in next 1-2 weeks ?Goal status: INITIAL ?  ?2.  Pt will demo/state understanding of initial HEP to improve pain levels and prerequisite motion. ?Target date: 10/06/21 ?Goal status: INITIAL ?  ?  ?LONG TERM GOALS: ?  ?Pt will improve functional ability by decreased impairment per Quick DASH assessment from 61% to 15% or better, for better quality of life. ?Target date: 11/03/21 ?Goal status: INITIAL ?  ?2.  Pt will  improve grip strength in right hand from 17lbs to at least 45lbs for functional use at home and in IADLs. ?Target date: 11/03/21 ?Goal status: INITIAL ?  ?3.  Pt will improve A/ROM in right wrist flexion from (-

## 2021-10-19 ENCOUNTER — Encounter: Payer: Self-pay | Admitting: Rehabilitative and Restorative Service Providers"

## 2021-10-19 ENCOUNTER — Ambulatory Visit (INDEPENDENT_AMBULATORY_CARE_PROVIDER_SITE_OTHER): Payer: Commercial Managed Care - PPO | Admitting: Rehabilitative and Restorative Service Providers"

## 2021-10-19 DIAGNOSIS — M6281 Muscle weakness (generalized): Secondary | ICD-10-CM | POA: Diagnosis not present

## 2021-10-19 DIAGNOSIS — R278 Other lack of coordination: Secondary | ICD-10-CM | POA: Diagnosis not present

## 2021-10-19 DIAGNOSIS — M25631 Stiffness of right wrist, not elsewhere classified: Secondary | ICD-10-CM | POA: Diagnosis not present

## 2021-10-19 DIAGNOSIS — M79641 Pain in right hand: Secondary | ICD-10-CM

## 2021-10-19 NOTE — Therapy (Signed)
?OUTPATIENT OCCUPATIONAL THERAPY TREATMENT & discharge NOTE ? ? ?Patient Name: [name] ?MRN: [id number] ?DOB:1960-11-04, 61 y.o., female ?Today's Date: 10/19/2021 ? ?PCP: Chevis Pretty, FNP ?REFERRING PROVIDER: Sherilyn Cooter, MD ? ?END OF SESSION:  ? OT End of Session - 10/19/21 0801   ? ? Visit Number 7   ? Number of Visits 10   ? Date for OT Re-Evaluation 11/03/21   ? OT Start Time 0801   ? OT Stop Time 0840   ? OT Time Calculation (min) 39 min   ? Activity Tolerance Patient tolerated treatment well;No increased pain;Patient limited by pain   ? Behavior During Therapy [facility] for tasks assessed/performed   ? ?  ?  ? ?  ? ? ? ?Past Medical History:  ?Diagnosis Date  ? Abdominal discomfort   ? Chronic abdominal and pelvic pain resulting in significant loss of time from work  ? Anxiety   ? with depression  ? Asthma   ? Chest pain   ? Depression   ? Diabetes mellitus   ? Drug overdose 06/12/2007  ? 60 Naprosyn, psychiatric admission  ? Hyperlipidemia   ? Severe  ? Hypertension   ? Migraines   ? Tremor of both hands   ? on propranolol  ? ?Past Surgical History:  ?Procedure Laterality Date  ? ABDOMINAL HYSTERECTOMY  02/10/2007  ? + lysis of adhesions  ? APPENDECTOMY  1990s  ? ruptured, late 90s  ? BILATERAL OOPHORECTOMY    ?  in 2 surgeries prior to 9/08  ? CARPAL TUNNEL RELEASE Right 08/16/2021  ? Procedure: RIGHT CARPAL TUNNEL RELEASE;  Surgeon: Sherilyn Cooter, MD;  Location: [place];  Service: Orthopedics;  Laterality: Right;  ? COLONOSCOPY  02/09/2010  ?  normal upper endoscopy, single colonic polyp,  ? HERNIA REPAIR    ? VENTRAL HERNIA REPAIR    ? x3  ? ?Patient Active Problem List  ? Diagnosis Date Noted  ? Carpal tunnel syndrome 08/16/2021  ? Numbness and tingling in right hand 07/11/2021  ? Diverticulosis 07/13/2016  ? Morbid obesity ([place]) 03/31/2015  ? Hyperlipidemia   ? Hypothyroidism   ? Hypertension   ? POLYP, COLON 04/13/2010  ? Diabetes ([place]) 04/13/2010  ? Anxiety  state 04/13/2010  ? Depression 04/13/2010  ? Asthma 04/13/2010  ? Migraines 04/13/2010  ? ? ?ONSET DATE: 08/16/21 DOS ?  ?REFERRING DIAG: G56.01 (ICD-10-CM) - Carpal tunnel syndrome of right wrist ? ?THERAPY DIAG:  ?Other lack of coordination ? ?Pain in right hand ? ?Muscle weakness (generalized) ? ?Stiffness of right wrist, not elsewhere classified ? ? ?PERTINENT HISTORY: She states still having numbness, tenderness at scar, difficulty putting pressure on palm and holding/lifting objects. She does have DM II and neuropathy issues in her feet. She also has hx of essential tremor for which she takes medications ? ?PRECAUTIONS: 9 weeks from sx now, PRE ok, recommend to avoid repetitive or heavy lifting/grasping for 4-6 more weeks or as tolerated.  ? ?SUBJECTIVE:  ?She states doing better since last visit, back to light HEP protocol. She states she'll need to stop therapy as she is quitting her last job, starting a new one and won't have insurance.   ? ?PAIN:  ?Are you having pain?  Yes, much better today ?Rating: 0-1/10 at rest now, up to 4/10 with using hand in past 2 days ? ?OBJECTIVE: (All objective assessments below are from initial evaluation on: 09/25/21 unless otherwise specified.)  ? ?HAND DOMINANCE: Left ?  ?  ADLs: ?Overall ADLs: Having trouble and pain opening things, holding/using silverware, sleeping, clothing fasteners, etc., as well as significant problems with IADLs.  ?  ?  ?FUNCTIONAL OUTCOME MEASURES: ?Eval: Quick Dash: 61% impairment today  ?10/19/21: Quick Dash: 25% impairment today  ?  ?UE ROM   Eval: Rt hand had ~3cm gap to full fist with AROM at eval but passively was ~1cm gap or less.  ? ?10/19/21: Rt hand makes tight full fist full opposition now as well  ?  ?AROM Right ?09/25/2021 Right ?10/12/21 Right ?10/19/21  ?Wrist flexion (-26*) 50* (57* on left side) 60*  ?Wrist extension 48* 58* 58*  ?Wrist ulnar deviation      ?Wrist radial deviation      ?Wrist pronation 70  81  ?Wrist supination 80  76   ?(Blank rows = not tested) ?  ?  ?UE MMT:   Eval: NT at eval due to pain but overly weak and scores 3-/5MMT in right hand grossly ?  ?MMT Right ?10/12/2021 Right ?10/19/21  ?Elbow flexion  5/5   ?Elbow extension 4+/5  5/5  ?Wrist flexion 4/5 pain  4/5 MMT  ?Wrist extension 5/5    ?Wrist ulnar deviation     ?Wrist radial deviation     ?Wrist pronation 4/5 pain  5/5  ?Wrist supination 4+/5 no pain  5/5  ?(Blank rows = not tested) ?  ?HAND FUNCTION: ?Grip strength: Right: 17 lbs; Left: 62 lbs ? ?10/12/21: Grip strength: Right: 28 lbs; Left: 70 lbs; 3 point pinch: Right: 7 lbs, Left: 12 lbs ? ?10/19/21: Grip strength: Right: 35 lbs; 3 point pinch: Right: 13 lbs ?  ?COORDINATION: ?9 Hole Peg test: Right: 34 sec; Left: 27 sec ? ?10/19/21: 9 Hole Peg test: Right: 24 sec (greatly improved WFL) ?  ?SENSATION: ?09/25/21: SWMFT: 3.61 in Rt hand Ulnar and Median Nerves (2.83 in Lt hand globally)  ? ?10/12/21: SWMFT: 3.61 in Rt hand Ulnar and Median Nerves (thumb is 2.83 today though- better)  (2.83 in Lt hand globally)  ? ?10/17/21: SWMFT Rt hand: 3.61 in whole hand today.  ?  ?EDEMA: very mild swelling about right hand/palm now ?  ?COGNITION: ?Overall cognitive status: Within functional limits for tasks assessed ?  ?OBSERVATIONS: She was flinching in pain and yelling out with only slight wrist motion today, also not able to tolerate direct light touch to sx are without significant pain/flinching today  ?  ?  ?TODAY'S TREATMENT:  ?10/19/21: She performs gripping, ROM and strength for there ex as well as new measures. Everything looks quite improved from eval and recent exacerbation.  OT reviews HEP with her as well as nerve pain management, good nutrition to support nerve health and gives examples of adapting daily and work activities to prevent problems/give her support. She also performs functional coordination activities. OT recommends light compression glove or palm protector as needed for the tough job of moving. She states  understanding all and no more questions/concerns.  ? ?10/17/21: Due to high anxiety and increasing pain reports, OT educates on nerves and pain perception. We discussed the close link between stress and pain perception for neuromuscular re-education, and OT encourages her to find some stress relief. OT also talks about daily planning skills as she has been out of work for a while now and should have been planning for this eventuality. She was encouraged to do work that doesn't hurt her, and make plans financially, etc.  OT also educates on specific HEP to perform  when pain is increased (for whatever reason) including "backing off" to light AROM, desensitization, and possibly light putty (yellow), light stretches at wrist and nerve glides as tolerated.  She was also edu to step back up to more aggressive program after 1-2 of decreased pain from exacerbation. Additionally she asks about safety during moving, and OT recommends to warmup and stretch first, avoid carrying palm up and putting pressure over CTR, pace herself, etc. She states understanding.  OT also does manual therapy: IASTM, manual desensitization and light stretches at wrist today in flex, ext. She states feeling a little better at end of session.  ? ? ?10/12/21: She does AROM and strength for measures and is doing much better but has weakness and pain in wrist flexion and supination. She is re-edu to stretch these again (as she states stopping), she also reviews tendon glides, nerve health with nerve glides, and putty pinches and grips (upgrades to green now). She also upgrades to wrist and FA strength with hammer in wrist flexion and pronation strength. She does well with all no added pain. OT also does manual stretches for her at wrist and FA today, she states feeling better.  ?  ?PATIENT EDUCATION: ?Education details: see tx section above for details  ?Person educated: Patient ?Education method: Explanation, Demonstration, Verbal cues, and  Handouts ?Education comprehension: verbalized understanding, returned demonstration, verbal cues required, and needs further education ?  ?HOME EXERCISE PROGRAM: ?Access Code: [email or web address] ?URL: [email or web address] ?Prepar

## 2021-10-20 ENCOUNTER — Encounter: Payer: Commercial Managed Care - PPO | Admitting: Rehabilitative and Restorative Service Providers"

## 2022-01-05 ENCOUNTER — Ambulatory Visit: Payer: Self-pay | Admitting: Nurse Practitioner

## 2022-01-08 ENCOUNTER — Ambulatory Visit: Payer: Self-pay | Admitting: Nurse Practitioner

## 2022-04-19 ENCOUNTER — Ambulatory Visit: Payer: Self-pay | Admitting: Nurse Practitioner

## 2022-04-20 ENCOUNTER — Encounter: Payer: Self-pay | Admitting: Nurse Practitioner

## 2022-04-20 ENCOUNTER — Ambulatory Visit: Payer: Self-pay | Admitting: Nurse Practitioner

## 2022-04-20 VITALS — BP 109/64 | HR 64 | Temp 97.4°F | Resp 20 | Ht 64.0 in | Wt 215.0 lb

## 2022-04-20 DIAGNOSIS — G250 Essential tremor: Secondary | ICD-10-CM

## 2022-04-20 DIAGNOSIS — I10 Essential (primary) hypertension: Secondary | ICD-10-CM

## 2022-04-20 DIAGNOSIS — E782 Mixed hyperlipidemia: Secondary | ICD-10-CM

## 2022-04-20 DIAGNOSIS — F3341 Major depressive disorder, recurrent, in partial remission: Secondary | ICD-10-CM

## 2022-04-20 DIAGNOSIS — K5790 Diverticulosis of intestine, part unspecified, without perforation or abscess without bleeding: Secondary | ICD-10-CM

## 2022-04-20 DIAGNOSIS — E039 Hypothyroidism, unspecified: Secondary | ICD-10-CM

## 2022-04-20 DIAGNOSIS — E1142 Type 2 diabetes mellitus with diabetic polyneuropathy: Secondary | ICD-10-CM

## 2022-04-20 DIAGNOSIS — F411 Generalized anxiety disorder: Secondary | ICD-10-CM

## 2022-04-20 LAB — BAYER DCA HB A1C WAIVED: HB A1C (BAYER DCA - WAIVED): 10.1 % — ABNORMAL HIGH (ref 4.8–5.6)

## 2022-04-20 MED ORDER — CITALOPRAM HYDROBROMIDE 40 MG PO TABS: 40.0000 mg | ORAL_TABLET | Freq: Every day | ORAL | 1 refills | Status: DC

## 2022-04-20 MED ORDER — GLIPIZIDE ER 10 MG PO TB24: 10.0000 mg | ORAL_TABLET | Freq: Every day | ORAL | 1 refills | Status: DC

## 2022-04-20 MED ORDER — BUPROPION HCL ER (XL) 150 MG PO TB24: 450.0000 mg | ORAL_TABLET | Freq: Every day | ORAL | 2 refills | Status: DC

## 2022-04-20 MED ORDER — ATORVASTATIN CALCIUM 40 MG PO TABS: 40.0000 mg | ORAL_TABLET | Freq: Every day | ORAL | 4 refills | Status: DC

## 2022-04-20 MED ORDER — FENOFIBRATE 160 MG PO TABS: 160.0000 mg | ORAL_TABLET | Freq: Every day | ORAL | 4 refills | Status: DC

## 2022-04-20 MED ORDER — PROPRANOLOL HCL 40 MG PO TABS: 40.0000 mg | ORAL_TABLET | Freq: Two times a day (BID) | ORAL | 1 refills | Status: DC

## 2022-04-20 MED ORDER — BENAZEPRIL HCL 20 MG PO TABS: 20.0000 mg | ORAL_TABLET | Freq: Every day | ORAL | 1 refills | Status: DC

## 2022-04-20 NOTE — Patient Instructions (Signed)

## 2022-04-20 NOTE — Progress Notes (Signed)
Subjective:    Patient ID: [name], female    DOB: 01/15/1961, 61 y.o.   MRN: [id number]   Chief Complaint: medical management of chronic issues     HPI:  [name] is a 61 y.o. who identifies as a female who was assigned female at birth.   Social history: Lives with: by herself Work history: daycare- getting ready to start   Comes in today for follow up of the following chronic medical issues:  1. Primary hypertension No c/o chest pain, sob or headache. Does not check blood pressure at home. BP Readings from Last 3 Encounters:  10/06/21 131/65  08/17/21 (!) 144/57  08/01/21 120/70     2. Mixed hyperlipidemia Does not really watch diet and does no dedicated exercise. Is on liptior daily Lab Results  Component Value Date   CHOL 382 (H) 10/06/2021   HDL 52 10/06/2021   LDLCALC 270 (H) 10/06/2021   LDLDIRECT 211 (H) 04/28/2019   TRIG 275 (H) 10/06/2021   CHOLHDL 7.3 (H) 10/06/2021     3. Type 2 diabetes mellitus with diabetic polyneuropathy, without long-term current use of insulin ([place]) She does not check her blood suagrs at home. She has only been taking glipizide. Sh ecould not afford the others so has not been taking.NO low blood sugars. She has not been seen by me since hgba1c increase. Last time I saw patient her hgba1c was 6.3% Lab Results  Component Value Date   HGBA1C 8.8 (H) 10/06/2021     4. Acquired hypothyroidism No issues that she is aware of Lab Results  Component Value Date   TSH 2.200 06/30/2021     5. Diverticulosis No recent flare ups  6. Anxiety state    04/20/2022   10:17 AM 10/06/2021    9:51 AM 05/19/2021    2:45 PM 03/06/2021   11:29 AM  GAD 7 : Generalized Anxiety Score  Nervous, Anxious, on Edge 1 0 0 0  Control/stop worrying 0 0 1 0  Worry too much - different things 0 1 0 1  Trouble relaxing 0 1 0 1  Restless 0 0 0 0  Easily annoyed or irritable 0 0 0 0  Afraid - awful might happen 0 0 0 0   Total GAD 7 Score _0 Anxiety Difficulty Not difficult at all Somewhat difficult Not difficult at all Somewhat difficult      7. Recurrent major depressive disorder, in partial remission [facility]) I son wellbutrin and is doing well.    04/20/2022   10:16 AM 10/06/2021    9:51 AM 05/19/2021    2:44 PM  Depression screen PHQ 2/9  Decreased Interest 0 0 0  Down, Depressed, Hopeless 0 1 0  PHQ - 2 Score 0 1 0  Altered sleeping 0 1 1  Tired, decreased energy 0 0 0  Change in appetite 0 0 0  Feeling bad or failure about yourself  0 0 0  Trouble concentrating 0 0 0  Moving slowly or fidgety/restless 0 0 0  Suicidal thoughts 0 0 0  PHQ-9 Score 0 2 1  Difficult doing work/chores Not difficult at all Somewhat difficult Not difficult at all     8. Morbid obesity ([place]) Weight is down 2 lbs Wt Readings from Last 3 Encounters:  04/20/22 215 lb (97.5 kg)  10/06/21 217 lb (98.4 kg)  08/16/21 212 lb 15.4 oz (96.6 kg)   BMI Readings from Last 3 Encounters:  04/20/22 36.90 kg/m  10/06/21 37.25 kg/m  08/16/21 36.56 kg/m      New complaints: None otday  Allergies  Allergen Reactions   Aspirin Anaphylaxis   Bee Venom Anaphylaxis   Benadryl [Diphenhydramine Hcl] Anaphylaxis   Morphine And Related Shortness Of Breath and Itching   Vicks Formula 44 Cough-Cold Pm [Dm-Apap-Cpm] Shortness Of Breath and Rash    Not anaphylaxis   Outpatient Encounter Medications as of 04/20/2022  Medication Sig   albuterol (VENTOLIN HFA) 108 (90 Base) MCG/ACT inhaler Inhale 2 puffs into the lungs every 4 (four) hours as needed for wheezing or shortness of breath. (NEEDS TO BE SEEN)   atorvastatin (LIPITOR) 40 MG tablet Take 1 tablet (40 mg total) by mouth daily.   benazepril (LOTENSIN) 20 MG tablet Take 1 tablet (20 mg total) by mouth daily.   buPROPion (WELLBUTRIN XL) 150 MG 24 hr tablet Take 3 tablets (450 mg total) by mouth daily.   citalopram (CELEXA) 40 MG tablet Take 1 tablet (40 mg total)  by mouth daily.   EPINEPHrine 0.3 mg/0.3 mL IJ SOAJ injection Inject 0.3 mg into the muscle as needed for anaphylaxis.   fenofibrate 160 MG tablet Take 1 tablet (160 mg total) by mouth daily.   gabapentin (NEURONTIN) 300 MG capsule Take 1 capsule (300 mg total) by mouth 3 (three) times daily.   glipiZIDE (GLUCOTROL XL) 10 MG 24 hr tablet Take 1 tablet (10 mg total) by mouth daily.   meclizine (ANTIVERT) 25 MG tablet Take 1 tablet (25 mg total) by mouth 2 (two) times daily as needed for dizziness.   ondansetron (ZOFRAN) 4 MG tablet Take 1 tablet (4 mg total) by mouth every 8 (eight) hours as needed for nausea or vomiting.   propranolol (INDERAL) 40 MG tablet Take 1 tablet (40 mg total) by mouth 2 (two) times daily. (Needs to be seen before next refill)   rizatriptan (MAXALT) 10 MG tablet Take 1 tablet (10 mg total) by mouth as needed for migraine. May repeat in 2 hours if needed   sitaGLIPtin-metformin (JANUMET) 50-1000 MG tablet Take 1 tablet by mouth 2 (two) times daily with breakfast and lunch.   topiramate (TOPAMAX) 50 MG tablet Take 1 tablet (50 mg total) by mouth 2 (two) times daily.   No facility-administered encounter medications on file as of 04/20/2022.    Past Surgical History:  Procedure Laterality Date   ABDOMINAL HYSTERECTOMY  02/10/2007   + lysis of adhesions   APPENDECTOMY  1990s   ruptured, late 90s   BILATERAL OOPHORECTOMY      in 2 surgeries prior to 9/08   CARPAL TUNNEL RELEASE Right 08/16/2021   Procedure: RIGHT CARPAL TUNNEL RELEASE;  Surgeon: Sherilyn Cooter, MD;  Location: [facility];  Service: Orthopedics;  Laterality: Right;   COLONOSCOPY  02/09/2010    normal upper endoscopy, single colonic polyp,   HERNIA REPAIR     VENTRAL HERNIA REPAIR     x3    History reviewed. No pertinent family history.    Controlled substance contract: n/a     Review of Systems  Constitutional:  Negative for diaphoresis.  Eyes:  Negative for pain.   Respiratory:  Negative for shortness of breath.   Cardiovascular:  Negative for chest pain, palpitations and leg swelling.  Gastrointestinal:  Negative for abdominal pain.  Endocrine: Negative for polydipsia.  Skin:  Negative for rash.  Neurological:  Negative for dizziness, weakness and headaches.  Hematological:  Does not bruise/bleed easily.  All other systems reviewed and are negative.      Objective:   Physical Exam Vitals and nursing note reviewed.  Constitutional:      General: She is not in acute distress.    Appearance: Normal appearance. She is well-developed.  HENT:     Head: Normocephalic.     Right Ear: Tympanic membrane normal.     Left Ear: Tympanic membrane normal.     Nose: Nose normal.     Mouth/Throat:     Mouth: Mucous membranes are moist.  Eyes:     Pupils: Pupils are equal, round, and reactive to light.  Neck:     Vascular: No carotid bruit or JVD.  Cardiovascular:     Rate and Rhythm: Normal rate and regular rhythm.     Heart sounds: Normal heart sounds.  Pulmonary:     Effort: Pulmonary effort is normal. No respiratory distress.     Breath sounds: Normal breath sounds. No wheezing or rales.  Chest:     Chest wall: No tenderness.  Abdominal:     General: Bowel sounds are normal. There is no distension or abdominal bruit.     Palpations: Abdomen is soft. There is no hepatomegaly, splenomegaly, mass or pulsatile mass.     Tenderness: There is no abdominal tenderness.  Musculoskeletal:        General: Normal range of motion.     Cervical back: Normal range of motion and neck supple.  Lymphadenopathy:     Cervical: No cervical adenopathy.  Skin:    General: Skin is warm and dry.  Neurological:     Mental Status: She is alert and oriented to person, place, and time.     Deep Tendon Reflexes: Reflexes are normal and symmetric.  Psychiatric:        Behavior: Behavior normal.        Thought Content: Thought content normal.        Judgment:  Judgment normal.    BP 109/64   Pulse 64   Temp (!) 97.4 F (36.3 C) (Temporal)   Resp 20   Ht 5' 4" (1.626 m)   Wt 215 lb (97.5 kg)   SpO2 95%   BMI 36.90 kg/m         Assessment & Plan:   DEANDREA VANPELT comes in today with chief complaint of Medical Management of Chronic Issues   Diagnosis and orders addressed:  1. Primary hypertension Low oddium diet - CBC with Differential/Platelet - CMP14+EGFR - benazepril (LOTENSIN) 20 MG tablet; Take 1 tablet (20 mg total) by mouth daily.  Dispense: 90 tablet; Refill: 1  2. Mixed hyperlipidemia Low fat diet - Lipid panel - atorvastatin (LIPITOR) 40 MG tablet; Take 1 tablet (40 mg total) by mouth daily.  Dispense: 30 tablet; Refill: 4 - fenofibrate 160 MG tablet; Take 1 tablet (160 mg total) by mouth daily.  Dispense: 30 tablet; Refill: 4  3. Type 2 diabetes mellitus with diabetic polyneuropathy, without long-term current use of insulin (HCC) Stricter carb counting Rybelsus 109m samples for 2 months until insurance kicks in- then we will switch back to janumet - Bayer DCA Hb A1c Waived - glipiZIDE (GLUCOTROL XL) 10 MG 24 hr tablet; Take 1 tablet (10 mg total) by mouth daily.  Dispense: 180 tablet; Refill: 1  4. Acquired hypothyroidism Lab spending  5. Diverticulosis Watch diet to prevent flare up  6. Anxiety state Stress manaegment - buPROPion (WELLBUTRIN XL) 150 MG 24 hr tablet; Take 3 tablets (  450 mg total) by mouth daily.  Dispense: 90 tablet; Refill: 2  7. Recurrent major depressive disorder, in partial remission (HCC) - citalopram (CELEXA) 40 MG tablet; Take 1 tablet (40 mg total) by mouth daily.  Dispense: 180 tablet; Refill: 1  8. Morbid obesity ([place]) Discussed diet and exercise for person with BMI >25 Will recheck weight in 3-6 months   9. Essential tremor - propranolol (INDERAL) 40 MG tablet; Take 1 tablet (40 mg total) by mouth 2 (two) times daily. (Needs to be seen before next refill)  Dispense: 180  tablet; Refill: 1   Labs pending Health Maintenance reviewed Diet and exercise encouraged  Follow up plan: 3 months   Mary-Margaret Hassell Done, FNP

## 2022-04-21 LAB — CBC WITH DIFFERENTIAL/PLATELET
Basophils Absolute: 0.1 10*3/uL (ref 0.0–0.2)
Basos: 1 %
EOS (ABSOLUTE): 0.1 10*3/uL (ref 0.0–0.4)
Eos: 1 %
Hematocrit: 39.2 % (ref 34.0–46.6)
Hemoglobin: 12.9 g/dL (ref 11.1–15.9)
Immature Grans (Abs): 0.1 10*3/uL (ref 0.0–0.1)
Immature Granulocytes: 1 %
Lymphocytes Absolute: 2.1 10*3/uL (ref 0.7–3.1)
Lymphs: 28 %
MCH: 27.4 pg (ref 26.6–33.0)
MCHC: 32.9 g/dL (ref 31.5–35.7)
MCV: 83 fL (ref 79–97)
Monocytes Absolute: 0.4 10*3/uL (ref 0.1–0.9)
Monocytes: 6 %
Neutrophils Absolute: 4.7 10*3/uL (ref 1.4–7.0)
Neutrophils: 63 %
Platelets: 207 10*3/uL (ref 150–450)
RBC: 4.71 x10E6/uL (ref 3.77–5.28)
RDW: 13.1 % (ref 11.7–15.4)
WBC: 7.4 10*3/uL (ref 3.4–10.8)

## 2022-04-21 LAB — CMP14+EGFR
ALT: 18 IU/L (ref 0–32)
AST: 13 IU/L (ref 0–40)
Albumin/Globulin Ratio: 1.7 (ref 1.2–2.2)
Albumin: 4.3 g/dL (ref 3.9–4.9)
Alkaline Phosphatase: 87 IU/L (ref 44–121)
BUN/Creatinine Ratio: 22 (ref 12–28)
BUN: 19 mg/dL (ref 8–27)
Bilirubin Total: 0.3 mg/dL (ref 0.0–1.2)
CO2: 22 mmol/L (ref 20–29)
Calcium: 9.8 mg/dL (ref 8.7–10.3)
Chloride: 93 mmol/L — ABNORMAL LOW (ref 96–106)
Creatinine, Ser: 0.85 mg/dL (ref 0.57–1.00)
Globulin, Total: 2.6 g/dL (ref 1.5–4.5)
Glucose: 345 mg/dL — ABNORMAL HIGH (ref 70–99)
Potassium: 4.7 mmol/L (ref 3.5–5.2)
Sodium: 133 mmol/L — ABNORMAL LOW (ref 134–144)
Total Protein: 6.9 g/dL (ref 6.0–8.5)
eGFR: 78 mL/min/{1.73_m2} (ref >59)

## 2022-04-21 LAB — LIPID PANEL
Chol/HDL Ratio: 9.7 ratio — ABNORMAL HIGH (ref 0.0–4.4)
Cholesterol, Total: 448 mg/dL — ABNORMAL HIGH (ref 100–199)
HDL: 46 mg/dL (ref >39)
LDL Chol Calc (NIH): 285 mg/dL — ABNORMAL HIGH (ref 0–99)
Triglycerides: 477 mg/dL — ABNORMAL HIGH (ref 0–149)
VLDL Cholesterol Cal: 117 mg/dL — ABNORMAL HIGH (ref 5–40)

## 2022-04-30 ENCOUNTER — Encounter: Payer: Self-pay | Admitting: Family Medicine

## 2022-04-30 ENCOUNTER — Ambulatory Visit (INDEPENDENT_AMBULATORY_CARE_PROVIDER_SITE_OTHER): Payer: Self-pay | Admitting: Family Medicine

## 2022-04-30 DIAGNOSIS — R051 Acute cough: Secondary | ICD-10-CM

## 2022-04-30 DIAGNOSIS — S098XXA Other specified injuries of head, initial encounter: Secondary | ICD-10-CM

## 2022-04-30 NOTE — Progress Notes (Signed)
Subjective:    Patient ID: [name], female    DOB: 07-12-1960, 61 y.o.   MRN: [id number]   HPI: [name] is a 61 y.o. female presenting for exposure to RSV. 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   3 days felt weird, vomited frequently for 2 days. Last was 24 hours ago. Started cough last night. Fever off and on for 3 days.   [place] one week ago. Hit head. Concerned about concussion. Has pain in head, but at the site where she hit when she fell. No photophobia      04/20/2022   10:16 AM 10/06/2021    9:51 AM 05/19/2021    2:44 PM 03/06/2021   11:28 AM 12/30/2020    9:59 AM  Depression screen PHQ 2/9  Decreased Interest 0 0 0 0 0  Down, Depressed, Hopeless 0 1 0 0 0  PHQ - 2 Score 0 1 0 0 0  Altered sleeping 0 '1 1 1 1  '$ Tired, decreased energy 0 0 0 0 0  Change in appetite 0 0 0 0 0  Feeling bad or failure about yourself  0 0 0 0 0  Trouble concentrating 0 0 0 0 0  Moving slowly or fidgety/restless 0 0 0 0 0  Suicidal thoughts 0 0 0 1 0  PHQ-9 Score 0 '2 1 2 1  '$ Difficult doing work/chores Not difficult at all Somewhat difficult Not difficult at all Somewhat difficult Not difficult at all     Relevant past medical, surgical, family and social history reviewed and updated as indicated.  Interim medical history since our last visit reviewed. Allergies and medications reviewed and updated.  ROS:  Review of Systems  Constitutional:  Positive for fatigue and fever.  HENT: Negative.    Eyes:  Negative for photophobia and visual disturbance.  Respiratory:  Positive for cough.   Gastrointestinal:  Positive for nausea and vomiting. Negative for diarrhea.     Social History   Tobacco Use  Smoking Status Never  Smokeless Tobacco Never       Objective:     Wt Readings from Last 3 Encounters:  04/20/22 215 lb (97.5 kg)  10/06/21 217 lb (98.4 kg)  08/16/21 212 lb 15.4 oz (96.6 kg)     Exam deferred. Pt. Harboring due to COVID  19. Phone visit performed.   Assessment & Plan:   1. Acute cough   2. Blunt head trauma, initial encounter     Pt. Advised to go to ED for dyspnea, intense HA, intractable vomiting and change of mental status. I told her she would need an in person visit for eval for possible concussion.  Orders Placed This Encounter  Procedures   COVID-19, Flu A+B and RSV    Order Specific Question:   Previously tested for COVID-19    Answer:   Yes    Order Specific Question:   Resident in a congregate (group) care setting    Answer:   No    Order Specific Question:   Is the patient student?    Answer:   No    Order Specific Question:   Employed in healthcare setting    Answer:   No    Order Specific Question:   Has patient completed COVID vaccination(s) (2 doses of Pfizer/Moderna 1 dose of [employer])    Answer:   Unknown    Order Specific Question:   Release to patient  Answer:   Immediate    Order Specific Question:   Pregnant    Answer:   No      Diagnoses and all orders for this visit:  Acute cough -     COVID-19, Flu A+B and RSV  Blunt head trauma, initial encounter    Virtual Visit via telephone Note  I discussed the limitations, risks, security and privacy concerns of performing an evaluation and management service by telephone and the availability of in person appointments. The patient was identified with two identifiers. Pt.expressed understanding and agreed to proceed. Pt. Is at home. Dr. Livia Snellen is in his office.  Follow Up Instructions:   I discussed the assessment and treatment plan with the patient. The patient was provided an opportunity to ask questions and all were answered. The patient agreed with the plan and demonstrated an understanding of the instructions.   The patient was advised to call back or seek an in-person evaluation if the symptoms worsen or if the condition fails to improve as anticipated.   Total minutes including chart review and phone  contact time: 14   Follow up plan: Return if symptoms worsen or fail to improve.  Claretta Fraise, MD [place]

## 2022-05-01 ENCOUNTER — Other Ambulatory Visit: Payer: Self-pay | Admitting: *Deleted

## 2022-05-01 DIAGNOSIS — J029 Acute pharyngitis, unspecified: Secondary | ICD-10-CM

## 2022-05-01 LAB — RAPID STREP SCREEN (MED CTR MEBANE ONLY): Strep Gp A Ag, IA W/Reflex: NEGATIVE

## 2022-05-01 LAB — CULTURE, GROUP A STREP

## 2022-05-02 ENCOUNTER — Telehealth: Payer: Self-pay

## 2022-05-02 LAB — COVID-19, FLU A+B AND RSV
Influenza A, NAA: NOT DETECTED
Influenza B, NAA: NOT DETECTED
RSV, NAA: DETECTED — AB
SARS-CoV-2, NAA: NOT DETECTED

## 2022-05-02 NOTE — Telephone Encounter (Signed)
Transition Care Management Follow-up Telephone Call Date of discharge and from where: 05/01/2022 [facility] How have you been since you were released from the hospital? Patient is not doing well - difficulty  Any questions or concerns? No  Items Reviewed: Did the pt receive and understand the discharge instructions provided? Yes  Medications obtained and verified? Yes  Other? Yes  Any new allergies since your discharge? No  Dietary orders reviewed? Yes Do you have support at home? Yes   Home Care and Equipment/Supplies: Were home health services ordered? not applicable If so, what is the name of the agency? na  Has the agency set up a time to come to the patient's home? not applicable Were any new equipment or medical supplies ordered?  No What is the name of the medical supply agency? na Were you able to get the supplies/equipment? not applicable Do you have any questions related to the use of the equipment or supplies? No  Functional Questionnaire: (I = Independent and D = Dependent) ADLs: I  Bathing/Dressing- i  Meal Prep- i  Eating- i  Maintaining continence- i  Transferring/Ambulation- i  Managing Meds- i  Follow up appointments reviewed:  PCP Hospital f/u appt confirmed?  Patient will call back Monday if no improvement due to office being closed for holiday  - if medical treatment need sooner patient advised to go to urgent care, [facility]center, or ED Specialist Hospital f/u appt confirmed? No  Scheduled  Are transportation arrangements needed? No  If their condition worsens, is the pt aware to call PCP or go to the Emergency Dept.? Yes Was the patient provided with contact information for the PCP's office or ED? Yes Was to pt encouraged to call back with questions or concerns? Yes

## 2022-05-07 ENCOUNTER — Ambulatory Visit (INDEPENDENT_AMBULATORY_CARE_PROVIDER_SITE_OTHER): Payer: Self-pay | Admitting: Nurse Practitioner

## 2022-05-07 ENCOUNTER — Encounter: Payer: Self-pay | Admitting: Nurse Practitioner

## 2022-05-07 DIAGNOSIS — Z20828 Contact with and (suspected) exposure to other viral communicable diseases: Secondary | ICD-10-CM

## 2022-05-07 DIAGNOSIS — R051 Acute cough: Secondary | ICD-10-CM

## 2022-05-07 MED ORDER — PREDNISONE 20 MG PO TABS: 40.0000 mg | ORAL_TABLET | Freq: Every day | ORAL | 0 refills | Status: AC

## 2022-05-07 NOTE — Progress Notes (Signed)
Virtual Visit  Note Due to COVID-19 pandemic this visit was conducted virtually. This visit type was conducted due to national recommendations for restrictions regarding the COVID-19 Pandemic (e.g. social distancing, sheltering in place) in an effort to limit this patient's exposure and mitigate transmission in our community. All issues noted in this document were discussed and addressed.  A physical exam was not performed with this format.  I connected with [name] on 05/07/22 at 9:39 by telephone and verified that I am speaking with the correct person using two identifiers. [name] is currently located at home and no one is currently with her during visit. The provider, Mary-Margaret Hassell Done, FNP is located in their office at time of visit.  I discussed the limitations, risks, security and privacy concerns of performing an evaluation and management service by telephone and the availability of in person appointments. I also discussed with the patient that there may be a patient responsible charge related to this service. The patient expressed understanding and agreed to proceed.   History and Present Illness:  STarted working in a day care. A week into job she became very sick. She has been sick for 1-2 weeks. Her cough is bothering her the most.  URI  This is a new problem. The current episode started in the past 7 days. The problem has been waxing and waning. There has been no fever. Associated symptoms include chest pain, congestion, headaches, rhinorrhea and a sore throat. Pertinent negatives include no sinus pain. She has tried acetaminophen for the symptoms. The treatment provided mild relief.      Review of Systems  HENT:  Positive for congestion, rhinorrhea and sore throat. Negative for sinus pain.   Cardiovascular:  Positive for chest pain.  Neurological:  Positive for headaches.     Observations/Objective: Alert and oriented- answers all questions  appropriately No distress Deep wet cough Raspy voice  Assessment and Plan: [name] in today with chief complaint of No chief complaint on file.   1. Acute cough 2. RSV exposure 1. Take meds as prescribed 2. Use a cool mist humidifier especially during the winter months and when heat has been humid. 3. Use saline nose sprays frequently 4. Saline irrigations of the nose can be very helpful if done frequently.  * 4X daily for 1 week*  * Use of a nettie pot can be helpful with this. Follow directions with this* 5. Drink plenty of fluids 6. Keep thermostat turn down low 7.For any cough or congestion- mucinex 8. For fever or aces or pains- take tylenol or ibuprofen appropriate for age and weight.  * for fevers greater than 101 orally you may alternate ibuprofen and tylenol every  3 hours.       Follow Up Instructions: prn    I discussed the assessment and treatment plan with the patient. The patient was provided an opportunity to ask questions and all were answered. The patient agreed with the plan and demonstrated an understanding of the instructions.   The patient was advised to call back or seek an in-person evaluation if the symptoms worsen or if the condition fails to improve as anticipated.  The above assessment and management plan was discussed with the patient. The patient verbalized understanding of and has agreed to the management plan. Patient is aware to call the clinic if symptoms persist or worsen. Patient is aware when to return to the clinic for a follow-up visit. Patient educated on when it is  appropriate to go to the emergency department.   Time call ended:  9:50  I provided 11 minutes of  non face-to-face time during this encounter.    Mary-Margaret Hassell Done, FNP

## 2022-05-07 NOTE — Patient Instructions (Signed)

## 2022-06-19 ENCOUNTER — Telehealth: Payer: Self-pay | Admitting: Nurse Practitioner

## 2022-06-19 NOTE — Telephone Encounter (Signed)
2 months worth of Rybelsus left up front for patient pick up. Left detailed message on patients voicemail that samples are up front and ready for pick up

## 2022-06-26 ENCOUNTER — Encounter: Payer: Self-pay | Admitting: Nurse Practitioner

## 2022-06-26 ENCOUNTER — Telehealth: Payer: 59 | Admitting: Nurse Practitioner

## 2022-06-26 DIAGNOSIS — R052 Subacute cough: Secondary | ICD-10-CM | POA: Diagnosis not present

## 2022-06-26 MED ORDER — HYDROCODONE BIT-HOMATROP MBR 5-1.5 MG/5ML PO SOLN: 5.0000 mL | Freq: Three times a day (TID) | ORAL | 0 refills | Status: DC | PRN

## 2022-06-26 MED ORDER — PREDNISONE 20 MG PO TABS: 40.0000 mg | ORAL_TABLET | Freq: Every day | ORAL | 0 refills | Status: AC

## 2022-06-26 MED ORDER — BENZONATATE 100 MG PO CAPS: 100.0000 mg | ORAL_CAPSULE | Freq: Three times a day (TID) | ORAL | 0 refills | Status: DC | PRN

## 2022-06-26 NOTE — Patient Instructions (Signed)

## 2022-06-26 NOTE — Progress Notes (Signed)
Virtual Visit Consent   [name], you are scheduled for a virtual visit with Mary-Margaret Hassell Done, [place], a [facility] provider, today.     Just as with appointments in the office, your consent must be obtained to participate.  Your consent will be active for this visit and any virtual visit you may have with one of our providers in the next 365 days.     If you have a MyChart account, a copy of this consent can be sent to you electronically.  All virtual visits are billed to your insurance company just like a traditional visit in the office.    As this is a virtual visit, video technology does not allow for your provider to perform a traditional examination.  This may limit your provider's ability to fully assess your condition.  If your provider identifies any concerns that need to be evaluated in person or the need to arrange testing (such as labs, EKG, etc.), we will make arrangements to do so.     Although advances in technology are sophisticated, we cannot ensure that it will always work on either your end or our end.  If the connection with a video visit is poor, the visit may have to be switched to a telephone visit.  With either a video or telephone visit, we are not always able to ensure that we have a secure connection.     I need to obtain your verbal consent now.   Are you willing to proceed with your visit today? YES   [name] has provided verbal consent on 06/26/2022 for a virtual visit (video or telephone).   Mary-Margaret Hassell Done, FNP   Date: 06/26/2022 9:03 AM   Virtual Visit via Video Note   I, Mary-Margaret Hassell Done, connected with [name] ([id number], 1961/06/05) on 06/26/22 at 10:45 AM EST by a video-enabled telemedicine application and verified that I am speaking with the correct person using two identifiers.  Location: Patient: Virtual Visit Location Patient: Home Provider: Virtual Visit Location Provider: Mobile   I discussed  the limitations of evaluation and management by telemedicine and the availability of in person appointments. The patient expressed understanding and agreed to proceed.    History of Present Illness: [name] is a 62 y.o. who identifies as a female who was assigned female at birth, and is being seen today for RSV.  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 She had RSV about 3 weeks ago and just can't seem to shake it.  URI  This is a new problem. The current episode started 1 to 4 weeks ago. The problem has been waxing and waning. There has been no fever. Associated symptoms include congestion, coughing and rhinorrhea. She has tried acetaminophen for the symptoms.    Review of Systems  HENT:  Positive for congestion and rhinorrhea.   Respiratory:  Positive for cough.     Problems:  Patient Active Problem List   Diagnosis Date Noted   Carpal tunnel syndrome 08/16/2021   Numbness and tingling in right hand 07/11/2021   Diverticulosis 07/13/2016   Morbid obesity ([place]) 03/31/2015   Hyperlipidemia    Hypothyroidism    Hypertension    POLYP, COLON 04/13/2010   Diabetes ([place]) 04/13/2010   Anxiety state 04/13/2010   Depression 04/13/2010   Asthma 04/13/2010   Migraines 04/13/2010    Allergies:  Allergies  Allergen Reactions   Aspirin Anaphylaxis   Bee Venom  Anaphylaxis   Benadryl [Diphenhydramine Hcl] Anaphylaxis   Morphine And Related Shortness Of Breath and Itching   Vicks Formula 44 Cough-Cold Pm [Dm-Apap-Cpm] Shortness Of Breath and Rash    Not anaphylaxis   Medications:  Current Outpatient Medications:    albuterol (VENTOLIN HFA) 108 (90 Base) MCG/ACT inhaler, Inhale 2 puffs into the lungs every 4 (four) hours as needed for wheezing or shortness of breath. (NEEDS TO BE SEEN), Disp: 18 g, Rfl: 1   atorvastatin (LIPITOR) 40 MG tablet, Take 1 tablet (40 mg total) by mouth daily., Disp: 30 tablet, Rfl: 4   benazepril (LOTENSIN) 20 MG  tablet, Take 1 tablet (20 mg total) by mouth daily., Disp: 90 tablet, Rfl: 1   buPROPion (WELLBUTRIN XL) 150 MG 24 hr tablet, Take 3 tablets (450 mg total) by mouth daily., Disp: 90 tablet, Rfl: 2   citalopram (CELEXA) 40 MG tablet, Take 1 tablet (40 mg total) by mouth daily., Disp: 180 tablet, Rfl: 1   EPINEPHrine 0.3 mg/0.3 mL IJ SOAJ injection, Inject 0.3 mg into the muscle as needed for anaphylaxis., Disp: 2 each, Rfl: 2   fenofibrate 160 MG tablet, Take 1 tablet (160 mg total) by mouth daily., Disp: 30 tablet, Rfl: 4   gabapentin (NEURONTIN) 300 MG capsule, Take 1 capsule (300 mg total) by mouth 3 (three) times daily., Disp: 90 capsule, Rfl: 3   glipiZIDE (GLUCOTROL XL) 10 MG 24 hr tablet, Take 1 tablet (10 mg total) by mouth daily., Disp: 180 tablet, Rfl: 1   meclizine (ANTIVERT) 25 MG tablet, Take 1 tablet (25 mg total) by mouth 2 (two) times daily as needed for dizziness., Disp: 30 tablet, Rfl: 0   ondansetron (ZOFRAN) 4 MG tablet, Take 1 tablet (4 mg total) by mouth every 8 (eight) hours as needed for nausea or vomiting., Disp: 20 tablet, Rfl: 0   propranolol (INDERAL) 40 MG tablet, Take 1 tablet (40 mg total) by mouth 2 (two) times daily. (Needs to be seen before next refill), Disp: 180 tablet, Rfl: 1   rizatriptan (MAXALT) 10 MG tablet, Take 1 tablet (10 mg total) by mouth as needed for migraine. May repeat in 2 hours if needed, Disp: 10 tablet, Rfl: 2   sitaGLIPtin-metformin (JANUMET) 50-1000 MG tablet, Take 1 tablet by mouth 2 (two) times daily with breakfast and lunch., Disp: 90 tablet, Rfl: 1   topiramate (TOPAMAX) 50 MG tablet, Take 1 tablet (50 mg total) by mouth 2 (two) times daily., Disp: 60 tablet, Rfl: 2  Observations/Objective: Patient is well-developed, well-nourished in no acute distress.  Resting comfortably  at home.  Head is normocephalic, atraumatic.  No labored breathing.  Speech is clear and coherent with logical content.  Patient is alert and oriented at baseline.   Deep dry cough  Assessment and Plan:  Marylene Land in today with chief complaint of respiratory virus and Anemia   1. Subacute cough 1. Take meds as prescribed 2. Use a cool mist humidifier especially during the winter months and when heat has been humid. 3. Use saline nose sprays frequently 4. Saline irrigations of the nose can be very helpful if done frequently.  * 4X daily for 1 week*  * Use of a nettie pot can be helpful with this. Follow directions with this* 5. Drink plenty of fluids 6. Keep thermostat turn down low 7.For any cough or congestion- tessalon perles during the day and hycodan at night 8. For fever or aces or pains- take tylenol or ibuprofen appropriate  for age and weight.  * for fevers greater than 101 orally you may alternate ibuprofen and tylenol every  3 hours.   Meds ordered this encounter  Medications   predniSONE (DELTASONE) 20 MG tablet    Sig: Take 2 tablets (40 mg total) by mouth daily with breakfast for 5 days. 2 po daily for 5 days    Dispense:  10 tablet    Refill:  0    Order Specific Question:   Supervising Provider    Answer:   Caryl Pina A [[id number]]   HYDROcodone bit-homatropine (HYCODAN) 5-1.5 MG/5ML syrup    Sig: Take 5 mLs by mouth every 8 (eight) hours as needed for cough.    Dispense:  120 mL    Refill:  0    Order Specific Question:   Supervising Provider    Answer:   Caryl Pina A [[id number]]   benzonatate (TESSALON PERLES) 100 MG capsule    Sig: Take 1 capsule (100 mg total) by mouth 3 (three) times daily as needed.    Dispense:  20 capsule    Refill:  0    Order Specific Question:   Supervising Provider    Answer:   Caryl Pina A [[id number]]       Follow Up Instructions: I discussed the assessment and treatment plan with the patient. The patient was provided an opportunity to ask questions and all were answered. The patient agreed with the plan and demonstrated an understanding of the instructions.  A  copy of instructions were sent to the patient via MyChart.  The patient was advised to call back or seek an in-person evaluation if the symptoms worsen or if the condition fails to improve as anticipated.  Time:  I spent 7 minutes with the patient via telehealth technology discussing the above problems/concerns.    Mary-Margaret Hassell Done, FNP

## 2022-07-19 ENCOUNTER — Telehealth: Payer: Self-pay | Admitting: Nurse Practitioner

## 2022-07-19 NOTE — Telephone Encounter (Signed)
Requesting tessalon pearls and cough medicine with codeine   Curly Rim

## 2022-07-19 NOTE — Telephone Encounter (Signed)
RVS is a virus and there is nothing  to give other then inhalers she currently has

## 2022-07-20 ENCOUNTER — Other Ambulatory Visit: Payer: Self-pay | Admitting: Nurse Practitioner

## 2022-07-20 MED ORDER — BENZONATATE 100 MG PO CAPS: 100.0000 mg | ORAL_CAPSULE | Freq: Three times a day (TID) | ORAL | 0 refills | Status: DC | PRN

## 2022-07-23 ENCOUNTER — Encounter: Payer: Self-pay | Admitting: Nurse Practitioner

## 2022-07-23 ENCOUNTER — Ambulatory Visit (INDEPENDENT_AMBULATORY_CARE_PROVIDER_SITE_OTHER): Payer: 59 | Admitting: Nurse Practitioner

## 2022-07-23 VITALS — BP 159/82 | HR 93 | Temp 98.0°F | Resp 20 | Ht 64.0 in | Wt 207.0 lb

## 2022-07-23 DIAGNOSIS — K5790 Diverticulosis of intestine, part unspecified, without perforation or abscess without bleeding: Secondary | ICD-10-CM

## 2022-07-23 DIAGNOSIS — I10 Essential (primary) hypertension: Secondary | ICD-10-CM

## 2022-07-23 DIAGNOSIS — F411 Generalized anxiety disorder: Secondary | ICD-10-CM | POA: Diagnosis not present

## 2022-07-23 DIAGNOSIS — G43711 Chronic migraine without aura, intractable, with status migrainosus: Secondary | ICD-10-CM | POA: Diagnosis not present

## 2022-07-23 DIAGNOSIS — E039 Hypothyroidism, unspecified: Secondary | ICD-10-CM | POA: Diagnosis not present

## 2022-07-23 DIAGNOSIS — F3341 Major depressive disorder, recurrent, in partial remission: Secondary | ICD-10-CM | POA: Diagnosis not present

## 2022-07-23 DIAGNOSIS — E1142 Type 2 diabetes mellitus with diabetic polyneuropathy: Secondary | ICD-10-CM

## 2022-07-23 DIAGNOSIS — G43411 Hemiplegic migraine, intractable, with status migrainosus: Secondary | ICD-10-CM | POA: Diagnosis not present

## 2022-07-23 DIAGNOSIS — H1032 Unspecified acute conjunctivitis, left eye: Secondary | ICD-10-CM | POA: Diagnosis not present

## 2022-07-23 DIAGNOSIS — G43109 Migraine with aura, not intractable, without status migrainosus: Secondary | ICD-10-CM

## 2022-07-23 DIAGNOSIS — E782 Mixed hyperlipidemia: Secondary | ICD-10-CM

## 2022-07-23 DIAGNOSIS — J45909 Unspecified asthma, uncomplicated: Secondary | ICD-10-CM | POA: Diagnosis not present

## 2022-07-23 DIAGNOSIS — R809 Proteinuria, unspecified: Secondary | ICD-10-CM

## 2022-07-23 DIAGNOSIS — E1140 Type 2 diabetes mellitus with diabetic neuropathy, unspecified: Secondary | ICD-10-CM

## 2022-07-23 MED ORDER — HYDROCODONE BIT-HOMATROP MBR 5-1.5 MG/5ML PO SOLN: 5.0000 mL | Freq: Three times a day (TID) | ORAL | 0 refills | Status: DC | PRN

## 2022-07-23 MED ORDER — GABAPENTIN 300 MG PO CAPS: 300.0000 mg | ORAL_CAPSULE | Freq: Three times a day (TID) | ORAL | 3 refills | Status: DC

## 2022-07-23 MED ORDER — POLYMYXIN B-TRIMETHOPRIM 10000-0.1 UNIT/ML-% OP SOLN: 2.0000 [drp] | OPHTHALMIC | 0 refills | Status: DC

## 2022-07-23 MED ORDER — FENOFIBRATE 160 MG PO TABS: 160.0000 mg | ORAL_TABLET | Freq: Every day | ORAL | 4 refills | Status: DC

## 2022-07-23 MED ORDER — BUPROPION HCL ER (XL) 150 MG PO TB24: 450.0000 mg | ORAL_TABLET | Freq: Every day | ORAL | 2 refills | Status: DC

## 2022-07-23 MED ORDER — TOPIRAMATE 50 MG PO TABS: 50.0000 mg | ORAL_TABLET | Freq: Two times a day (BID) | ORAL | 2 refills | Status: DC

## 2022-07-23 MED ORDER — GLIPIZIDE ER 10 MG PO TB24: 10.0000 mg | ORAL_TABLET | Freq: Every day | ORAL | 1 refills | Status: DC

## 2022-07-23 MED ORDER — RIZATRIPTAN BENZOATE 10 MG PO TABS: 10.0000 mg | ORAL_TABLET | ORAL | 2 refills | Status: DC | PRN

## 2022-07-23 MED ORDER — ATORVASTATIN CALCIUM 40 MG PO TABS: 40.0000 mg | ORAL_TABLET | Freq: Every day | ORAL | 4 refills | Status: DC

## 2022-07-23 MED ORDER — BENAZEPRIL HCL 20 MG PO TABS: 20.0000 mg | ORAL_TABLET | Freq: Every day | ORAL | 1 refills | Status: DC

## 2022-07-23 MED ORDER — SITAGLIPTIN PHOS-METFORMIN HCL 50-1000 MG PO TABS: 1.0000 | ORAL_TABLET | Freq: Two times a day (BID) | ORAL | 1 refills | Status: DC

## 2022-07-23 MED ORDER — BENZONATATE 100 MG PO CAPS: 100.0000 mg | ORAL_CAPSULE | Freq: Three times a day (TID) | ORAL | 0 refills | Status: DC | PRN

## 2022-07-23 MED ORDER — CITALOPRAM HYDROBROMIDE 40 MG PO TABS: 40.0000 mg | ORAL_TABLET | Freq: Every day | ORAL | 1 refills | Status: DC

## 2022-07-23 NOTE — Patient Instructions (Signed)

## 2022-07-23 NOTE — Progress Notes (Signed)
Subjective:    Patient ID: [name], female    DOB: Jul 25, 1960, 62 y.o.   MRN: [id number]  Chief Complaint: medical management of chronic issues     HPI:  [name] is a 62 y.o. who identifies as a female who was assigned female at birth.   Social history: Lives with: by herself Work history: works at [employer] in today for follow up of the following chronic medical issues:  1. Primary hypertension No c/o chest pain, sob or headache. Does not check blood pressure at home. BP Readings from Last 3 Encounters:  04/20/22 109/64  10/06/21 131/65  08/17/21 (!) 144/57     2. Mixed hyperlipidemia Tries to watch diet but does very little exercise Lab Results  Component Value Date   CHOL 448 (H) 04/20/2022   HDL 46 04/20/2022   LDLCALC 285 (H) 04/20/2022   LDLDIRECT 211 (H) 04/28/2019   TRIG 477 (H) 04/20/2022   CHOLHDL 9.7 (H) 04/20/2022     3. Acquired hypothyroidism No issues that she is aware of Lab Results  Component Value Date   TSH 2.200 06/30/2021     4. 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 She has had RSV and has had a lot of wheezing.  5. Diverticulosis No recent flare ups  6. Intractable hemiplegic migraine with status migrainosus Has not had any migraines recently. Is still on topamax daily and that works well for her.  7. Type 2 diabetes mellitus with diabetic polyneuropathy, without long-term current use of insulin ([place]) Does not check blood sugars at home very often. Tries to watch diet. We added rybellsus at last visit and samples were given. Has been over 200 at times. She has not been taking janumet. Lab Results  Component Value Date   HGBA1C 10.1 (H) 04/20/2022     8. Anxiety state Stays anxious    07/23/2022    4:23 PM 04/20/2022   10:17 AM 10/06/2021    9:51 AM 05/19/2021    2:45 PM  GAD 7 : Generalized Anxiety Score   Nervous, Anxious, on Edge 0 1 0 0  Control/stop worrying 0 0 0 1  Worry too much - different things 1 0 1 0  Trouble relaxing 1 0 1 0  Restless 0 0 0 0  Easily annoyed or irritable 0 0 0 0  Afraid - awful might happen 0 0 0 0  Total GAD 7 Score 2 1 2 1  $ Anxiety Difficulty Somewhat difficult Not difficult at all Somewhat difficult Not difficult at all       9. Recurrent major depressive disorder, in partial remission ([place]) Is on combination of wellbutrin and celexa. Has been doing better over the last year.    07/23/2022    4:23 PM 04/20/2022   10:16 AM 10/06/2021    9:51 AM 05/19/2021    2:44 PM 03/06/2021   11:28 AM  Depression screen PHQ 2/9  Decreased Interest 1 0 0 0 0  Down, Depressed, Hopeless 0 0 1 0 0  PHQ - 2 Score 1 0 1 0 0  Altered sleeping 1 0 1 1 1  $ Tired, decreased energy 1 0 0 0 0  Change in appetite 0 0 0 0 0  Feeling bad or failure about yourself  0 0 0 0 0  Trouble concentrating 0 0 0 0 0  Moving slowly or fidgety/restless 0 0  0 0 0  Suicidal thoughts 0 0 0 0 1  PHQ-9 Score 3 0 2 1 2  $ Difficult doing work/chores Somewhat difficult Not difficult at all Somewhat difficult Not difficult at all Somewhat difficult     10. Morbid obesity ([place]) No recent weight changes Wt Readings from Last 3 Encounters:  07/23/22 207 lb (93.9 kg)  04/20/22 215 lb (97.5 kg)  10/06/21 217 lb (98.4 kg)   BMI Readings from Last 3 Encounters:  07/23/22 35.53 kg/m  04/20/22 36.90 kg/m  10/06/21 37.25 kg/m     New complaints: Left eye crusty this morning  Allergies  Allergen Reactions   Aspirin Anaphylaxis   Bee Venom Anaphylaxis   Benadryl [Diphenhydramine Hcl] Anaphylaxis   Morphine And Related Shortness Of Breath and Itching   Vicks Formula 44 Cough-Cold Pm [Dm-Apap-Cpm] Shortness Of Breath and Rash    Not anaphylaxis   Outpatient Encounter Medications as of 07/23/2022  Medication Sig   albuterol (VENTOLIN HFA) 108 (90 Base) MCG/ACT inhaler Inhale 2 puffs  into the lungs every 4 (four) hours as needed for wheezing or shortness of breath. (NEEDS TO BE SEEN)   atorvastatin (LIPITOR) 40 MG tablet Take 1 tablet (40 mg total) by mouth daily.   benazepril (LOTENSIN) 20 MG tablet Take 1 tablet (20 mg total) by mouth daily.   benzonatate (TESSALON PERLES) 100 MG capsule Take 1 capsule (100 mg total) by mouth 3 (three) times daily as needed.   buPROPion (WELLBUTRIN XL) 150 MG 24 hr tablet Take 3 tablets (450 mg total) by mouth daily.   citalopram (CELEXA) 40 MG tablet Take 1 tablet (40 mg total) by mouth daily.   EPINEPHrine 0.3 mg/0.3 mL IJ SOAJ injection Inject 0.3 mg into the muscle as needed for anaphylaxis.   fenofibrate 160 MG tablet Take 1 tablet (160 mg total) by mouth daily.   gabapentin (NEURONTIN) 300 MG capsule Take 1 capsule (300 mg total) by mouth 3 (three) times daily.   glipiZIDE (GLUCOTROL XL) 10 MG 24 hr tablet Take 1 tablet (10 mg total) by mouth daily.   HYDROcodone bit-homatropine (HYCODAN) 5-1.5 MG/5ML syrup Take 5 mLs by mouth every 8 (eight) hours as needed for cough.   meclizine (ANTIVERT) 25 MG tablet Take 1 tablet (25 mg total) by mouth 2 (two) times daily as needed for dizziness.   ondansetron (ZOFRAN) 4 MG tablet Take 1 tablet (4 mg total) by mouth every 8 (eight) hours as needed for nausea or vomiting.   propranolol (INDERAL) 40 MG tablet Take 1 tablet (40 mg total) by mouth 2 (two) times daily. (Needs to be seen before next refill)   rizatriptan (MAXALT) 10 MG tablet Take 1 tablet (10 mg total) by mouth as needed for migraine. May repeat in 2 hours if needed   sitaGLIPtin-metformin (JANUMET) 50-1000 MG tablet Take 1 tablet by mouth 2 (two) times daily with breakfast and lunch.   topiramate (TOPAMAX) 50 MG tablet Take 1 tablet (50 mg total) by mouth 2 (two) times daily.   No facility-administered encounter medications on file as of 07/23/2022.    Past Surgical History:  Procedure Laterality Date   ABDOMINAL HYSTERECTOMY   02/10/2007   + lysis of adhesions   APPENDECTOMY  1990s   ruptured, late 90s   BILATERAL OOPHORECTOMY      in 2 surgeries prior to 9/08   CARPAL TUNNEL RELEASE Right 08/16/2021   Procedure: RIGHT CARPAL TUNNEL RELEASE;  Surgeon: Sherilyn Cooter, MD;  Location: [facility];  Service: Orthopedics;  Laterality: Right;   COLONOSCOPY  02/09/2010    normal upper endoscopy, single colonic polyp,   HERNIA REPAIR     VENTRAL HERNIA REPAIR     x3    No family history on file.    Controlled substance contract: n/a     Review of Systems  Constitutional:  Negative for diaphoresis.  Eyes:  Negative for pain.  Respiratory:  Negative for shortness of breath.   Cardiovascular:  Negative for chest pain, palpitations and leg swelling.  Gastrointestinal:  Negative for abdominal pain.  Endocrine: Negative for polydipsia.  Skin:  Negative for rash.  Neurological:  Negative for dizziness, weakness and headaches.  Hematological:  Does not bruise/bleed easily.  All other systems reviewed and are negative.      Objective:   Physical Exam Vitals and nursing note reviewed.  Constitutional:      General: She is not in acute distress.    Appearance: Normal appearance. She is well-developed.  HENT:     Head: Normocephalic.     Right Ear: Tympanic membrane normal.     Left Ear: Tympanic membrane normal.     Nose: Nose normal.     Mouth/Throat:     Mouth: Mucous membranes are moist.  Eyes:     Pupils: Pupils are equal, round, and reactive to light.  Neck:     Vascular: No carotid bruit or JVD.  Cardiovascular:     Rate and Rhythm: Normal rate and regular rhythm.     Heart sounds: Normal heart sounds.  Pulmonary:     Effort: Pulmonary effort is normal. No respiratory distress.     Breath sounds: Wheezing (faint exp wheezes) present. No rales.  Chest:     Chest wall: No tenderness.  Abdominal:     General: Bowel sounds are normal. There is no distension or abdominal bruit.      Palpations: Abdomen is soft. There is no hepatomegaly, splenomegaly, mass or pulsatile mass.     Tenderness: There is no abdominal tenderness.  Musculoskeletal:        General: Normal range of motion.     Cervical back: Normal range of motion and neck supple.  Lymphadenopathy:     Cervical: No cervical adenopathy.  Skin:    General: Skin is warm and dry.  Neurological:     Mental Status: She is alert and oriented to person, place, and time.     Deep Tendon Reflexes: Reflexes are normal and symmetric.  Psychiatric:        Behavior: Behavior normal.        Thought Content: Thought content normal.        Judgment: Judgment normal.    BP (!) 159/82   Pulse 93   Temp 98 F (36.7 C) (Temporal)   Resp 20   Ht 5' 4"$  (1.626 m)   Wt 207 lb (93.9 kg)   SpO2 95%   BMI 35.53 kg/m    Hgba1c 13.1     Assessment & Plan:   [name] in today with chief complaint of Medical Management of Chronic Issues (Left eye red)   1. Primary hypertension Low sodium diet - CBC with Differential/Platelet - CMP14+EGFR - benazepril (LOTENSIN) 20 MG tablet; Take 1 tablet (20 mg total) by mouth daily.  Dispense: 90 tablet; Refill: 1  2. Mixed hyperlipidemia Lo wfat diet - Lipid panel - atorvastatin (LIPITOR) 40 MG tablet; Take 1 tablet (40 mg total) by mouth daily.  Dispense: 30 tablet; Refill: 4 - fenofibrate 160 MG tablet; Take 1 tablet (160 mg total) by mouth daily.  Dispense: 30 tablet; Refill: 4  3. Acquired hypothyroidism Labs pending - Thyroid Panel With TSH  4. Mild asthma without complication, unspecified whether persistent - benzonatate (TESSALON PERLES) 100 MG capsule; Take 1 capsule (100 mg total) by mouth 3 (three) times daily as needed.  Dispense: 20 capsule; Refill: 0 - HYDROcodone bit-homatropine (HYCODAN) 5-1.5 MG/5ML syrup; Take 5 mLs by mouth every 8 (eight) hours as needed for cough.  Dispense: 120 mL; Refill: 0  5. Diverticulosis Watch diet to prevent flare  up  6. Intractable hemiplegic migraine with status migrainosus   7. Type 2 diabetes mellitus with diabetic polyneuropathy, without long-term current use of insulin (HCC) Back on janumet Strict low carb diet - Bayer DCA Hb A1c Waived - Microalbumin / creatinine urine ratio - sitaGLIPtin-metformin (JANUMET) 50-1000 MG tablet; Take 1 tablet by mouth 2 (two) times daily with breakfast and lunch.  Dispense: 90 tablet; Refill: 1 - glipiZIDE (GLUCOTROL XL) 10 MG 24 hr tablet; Take 1 tablet (10 mg total) by mouth daily.  Dispense: 180 tablet; Refill: 1  8. Anxiety state Stress management - buPROPion (WELLBUTRIN XL) 150 MG 24 hr tablet; Take 3 tablets (450 mg total) by mouth daily.  Dispense: 90 tablet; Refill: 2  9. Recurrent major depressive disorder, in partial remission (HCC) - citalopram (CELEXA) 40 MG tablet; Take 1 tablet (40 mg total) by mouth daily.  Dispense: 180 tablet; Refill: 1  10. Morbid obesity ([place]) Discussed diet and exercise for person with BMI >25 Will recheck weight in 3-6 months   11. Acute bacterial conjunctivitis of left eye Good hand washing Cool compresses - trimethoprim-polymyxin b (POLYTRIM) ophthalmic solution; Place 2 drops into both eyes every 4 (four) hours.  Dispense: 10 mL; Refill: 0  12. Intractable chronic migraine without aura and with status migrainosus - topiramate (TOPAMAX) 50 MG tablet; Take 1 tablet (50 mg total) by mouth 2 (two) times daily.  Dispense: 60 tablet; Refill: 2  13. Migraine with aura and without status migrainosus, not intractable - rizatriptan (MAXALT) 10 MG tablet; Take 1 tablet (10 mg total) by mouth as needed for migraine. May repeat in 2 hours if needed  Dispense: 10 tablet; Refill: 2  14. Type 2 diabetes mellitus with diabetic neuropathy, without long-term current use of insulin (HCC) - gabapentin (NEURONTIN) 300 MG capsule; Take 1 capsule (300 mg total) by mouth 3 (three) times daily.  Dispense: 90 capsule; Refill:  3    The above assessment and management plan was discussed with the patient. The patient verbalized understanding of and has agreed to the management plan. Patient is aware to call the clinic if symptoms persist or worsen. Patient is aware when to return to the clinic for a follow-up visit. Patient educated on when it is appropriate to go to the emergency department.   Mary-Margaret Hassell Done, FNP

## 2022-07-24 ENCOUNTER — Telehealth: Payer: Self-pay | Admitting: Nurse Practitioner

## 2022-07-24 LAB — CMP14+EGFR
ALT: 27 IU/L (ref 0–32)
AST: 17 IU/L (ref 0–40)
Albumin/Globulin Ratio: 1.6 (ref 1.2–2.2)
Albumin: 4.4 g/dL (ref 3.9–4.9)
Alkaline Phosphatase: 79 IU/L (ref 44–121)
BUN/Creatinine Ratio: 29 — ABNORMAL HIGH (ref 12–28)
BUN: 20 mg/dL (ref 8–27)
Bilirubin Total: <0.2 mg/dL (ref 0.0–1.2)
CO2: 21 mmol/L (ref 20–29)
Calcium: 10.1 mg/dL (ref 8.7–10.3)
Chloride: 97 mmol/L (ref 96–106)
Creatinine, Ser: 0.69 mg/dL (ref 0.57–1.00)
Globulin, Total: 2.7 g/dL (ref 1.5–4.5)
Glucose: 200 mg/dL — ABNORMAL HIGH (ref 70–99)
Potassium: 4.3 mmol/L (ref 3.5–5.2)
Sodium: 136 mmol/L (ref 134–144)
Total Protein: 7.1 g/dL (ref 6.0–8.5)
eGFR: 99 mL/min/{1.73_m2} (ref >59)

## 2022-07-24 LAB — LIPID PANEL
Chol/HDL Ratio: 9.2 ratio — ABNORMAL HIGH (ref 0.0–4.4)
Cholesterol, Total: 378 mg/dL — ABNORMAL HIGH (ref 100–199)
HDL: 41 mg/dL (ref >39)
LDL Chol Calc (NIH): 221 mg/dL — ABNORMAL HIGH (ref 0–99)
Triglycerides: 523 mg/dL — ABNORMAL HIGH (ref 0–149)
VLDL Cholesterol Cal: 116 mg/dL — ABNORMAL HIGH (ref 5–40)

## 2022-07-24 LAB — CBC WITH DIFFERENTIAL/PLATELET
Basophils Absolute: 0.1 10*3/uL (ref 0.0–0.2)
Basos: 1 %
EOS (ABSOLUTE): 0.2 10*3/uL (ref 0.0–0.4)
Eos: 2 %
Hematocrit: 37.3 % (ref 34.0–46.6)
Hemoglobin: 12.5 g/dL (ref 11.1–15.9)
Immature Grans (Abs): 0.1 10*3/uL (ref 0.0–0.1)
Immature Granulocytes: 1 %
Lymphocytes Absolute: 2.8 10*3/uL (ref 0.7–3.1)
Lymphs: 34 %
MCH: 27.8 pg (ref 26.6–33.0)
MCHC: 33.5 g/dL (ref 31.5–35.7)
MCV: 83 fL (ref 79–97)
Monocytes Absolute: 0.5 10*3/uL (ref 0.1–0.9)
Monocytes: 6 %
Neutrophils Absolute: 4.4 10*3/uL (ref 1.4–7.0)
Neutrophils: 56 %
Platelets: 292 10*3/uL (ref 150–450)
RBC: 4.5 x10E6/uL (ref 3.77–5.28)
RDW: 13.7 % (ref 11.7–15.4)
WBC: 8 10*3/uL (ref 3.4–10.8)

## 2022-07-24 LAB — THYROID PANEL WITH TSH
Free Thyroxine Index: 1.8 (ref 1.2–4.9)
T3 Uptake Ratio: 25 % (ref 24–39)
T4, Total: 7.2 ug/dL (ref 4.5–12.0)
TSH: 2.6 u[IU]/mL (ref 0.450–4.500)

## 2022-07-24 LAB — MICROALBUMIN / CREATININE URINE RATIO
Creatinine, Urine: 91.4 mg/dL
Microalb/Creat Ratio: 790 mg/g creat — ABNORMAL HIGH (ref 0–29)
Microalbumin, Urine: 721.7 ug/mL

## 2022-07-24 LAB — BAYER DCA HB A1C WAIVED: HB A1C (BAYER DCA - WAIVED): 13.1 % — ABNORMAL HIGH (ref 4.8–5.6)

## 2022-07-24 NOTE — Telephone Encounter (Signed)
Spoke with patient and she will continue with the medication that she has at home until the PA goes through.

## 2022-07-25 MED ORDER — ROSUVASTATIN CALCIUM 10 MG PO TABS: 10.0000 mg | ORAL_TABLET | Freq: Every day | ORAL | 3 refills | Status: DC

## 2022-07-25 MED ORDER — LISINOPRIL 10 MG PO TABS: 10.0000 mg | ORAL_TABLET | Freq: Every day | ORAL | 1 refills | Status: DC

## 2022-07-25 NOTE — Addendum Note (Signed)
Addended by: Chevis Pretty on: 07/25/2022 01:26 PM   Modules accepted: Orders

## 2022-07-25 NOTE — Addendum Note (Signed)
Addended by: Chevis Pretty on: 07/25/2022 04:32 PM   Modules accepted: Orders

## 2022-08-03 DIAGNOSIS — R296 Repeated falls: Secondary | ICD-10-CM | POA: Diagnosis not present

## 2022-08-03 DIAGNOSIS — S299XXA Unspecified injury of thorax, initial encounter: Secondary | ICD-10-CM | POA: Diagnosis not present

## 2022-08-03 DIAGNOSIS — R918 Other nonspecific abnormal finding of lung field: Secondary | ICD-10-CM | POA: Diagnosis not present

## 2022-08-03 DIAGNOSIS — J168 Pneumonia due to other specified infectious organisms: Secondary | ICD-10-CM | POA: Diagnosis not present

## 2022-08-03 DIAGNOSIS — R0602 Shortness of breath: Secondary | ICD-10-CM | POA: Diagnosis not present

## 2022-08-03 DIAGNOSIS — J0140 Acute pansinusitis, unspecified: Secondary | ICD-10-CM | POA: Diagnosis not present

## 2022-08-03 DIAGNOSIS — K449 Diaphragmatic hernia without obstruction or gangrene: Secondary | ICD-10-CM | POA: Diagnosis not present

## 2022-08-03 DIAGNOSIS — J4541 Moderate persistent asthma with (acute) exacerbation: Secondary | ICD-10-CM | POA: Diagnosis not present

## 2022-08-07 ENCOUNTER — Ambulatory Visit: Payer: 59 | Admitting: Nurse Practitioner

## 2022-08-07 ENCOUNTER — Encounter: Payer: Self-pay | Admitting: Nurse Practitioner

## 2022-08-07 VITALS — BP 142/67 | HR 63 | Temp 97.5°F | Resp 20 | Ht 64.0 in | Wt 203.0 lb

## 2022-08-07 DIAGNOSIS — R55 Syncope and collapse: Secondary | ICD-10-CM

## 2022-08-07 DIAGNOSIS — J189 Pneumonia, unspecified organism: Secondary | ICD-10-CM | POA: Diagnosis not present

## 2022-08-07 MED ORDER — HYDROCODONE BIT-HOMATROP MBR 5-1.5 MG/5ML PO SOLN: 5.0000 mL | Freq: Three times a day (TID) | ORAL | 0 refills | Status: DC | PRN

## 2022-08-07 NOTE — Progress Notes (Signed)
   Subjective:    Patient ID: [name], female    DOB: [birth year], 62 y.o.   MRN: [id number]   Chief Complaint: Hospitalization Follow-up   Patient went to the hospital on 08/03/22. Sh e was dx with pneumonia. Was given augmentin and zithromax.She is not much better yet. Still coughing a lot and wheezing.   * she thinks she is having seizures. Says she has had several episodes where she goes out and wakes up with scrapes on her. She says she just blacks out. She told ER doctor and they did hea CT which they told her was normal    Patient Active Problem List   Diagnosis Date Noted   Carpal tunnel syndrome 08/16/2021   Numbness and tingling in right hand 07/11/2021   Diverticulosis 07/13/2016   Morbid obesity ([place]) 03/31/2015   Hyperlipidemia    Hypothyroidism    Hypertension    POLYP, COLON 04/13/2010   Diabetes ([place]) 04/13/2010   Anxiety state 04/13/2010   Depression 04/13/2010   Asthma 04/13/2010   Migraines 04/13/2010       Review of Systems  Constitutional:  Positive for fatigue. Negative for chills and fever.  HENT:  Positive for congestion and rhinorrhea.   Respiratory:  Positive for cough, shortness of breath and wheezing.   Neurological:  Negative for dizziness and headaches.       Objective:   Physical Exam Vitals reviewed.  Constitutional:      Appearance: Normal appearance.  Cardiovascular:     Rate and Rhythm: Normal rate.     Heart sounds: Normal heart sounds.  Pulmonary:     Effort: Pulmonary effort is normal.     Breath sounds: Wheezing present.  Skin:    General: Skin is warm.  Neurological:     General: No focal deficit present.     Mental Status: She is alert and oriented to person, place, and time.  Psychiatric:        Mood and Affect: Mood normal.        Behavior: Behavior normal.    BP (!) 142/67   Pulse 63   Temp (!) 97.5 F (36.4 C) (Temporal)   Resp 20   Ht 5' 4"$  (1.626 m)   Wt 203 lb (92.1 kg)   SpO2 100%   BMI  34.84 kg/m         Assessment & Plan:   [name] in today with chief complaint of Hospitalization Follow-up   1. Syncope, unspecified syncope type Keep diary of episodes - Ambulatory referral to Neurology  2. Community acquired pneumonia of right middle lobe of lung Force fluids Continue antibiotics Follow up in 6 weeks to recheck pneumonia - HYDROcodone bit-homatropine (HYCODAN) 5-1.5 MG/5ML syrup; Take 5 mLs by mouth every 8 (eight) hours as needed for cough.  Dispense: 120 mL; Refill: 0    The above assessment and management plan was discussed with the patient. The patient verbalized understanding of and has agreed to the management plan. Patient is aware to call the clinic if symptoms persist or worsen. Patient is aware when to return to the clinic for a follow-up visit. Patient educated on when it is appropriate to go to the emergency department.   Mary-Margaret Hassell Done, FNP

## 2022-08-07 NOTE — Patient Instructions (Signed)
Syncope, Adult  Syncope is when you pass out or faint for a short time. It is caused by a sudden decrease in blood flow to the brain. This can happen for many reasons. It can sometimes happen when seeing blood, getting a shot (injection), or having pain or strong emotions. Most causes of fainting are not dangerous, but in some cases it can be a sign of a serious medical problem. If you faint, get help right away. Call your local emergency services (911 in the [place] Follow these instructions at home: Watch for any changes in your symptoms. Take these actions to stay safe and help with your symptoms: Knowing when you may be about to faint Signs that you may be about to faint include: Feeling dizzy or light-headed. It may feel like the room is spinning. Feeling weak. Feeling like you may vomit (nauseous). Seeing spots or seeing all white or all black. Having cold, clammy skin. Feeling warm and sweaty. Hearing ringing in the ears. If you start to feel like you might faint, sit or lie down right away. If sitting, lower your head down between your legs. If lying down, raise (elevate) your feet above the level of your heart. Breathe deeply and steadily. Wait until all of the symptoms are gone. Have someone stay with you until you feel better. Medicines Take over-the-counter and prescription medicines only as told by your doctor. If you are taking blood pressure or heart medicine, sit up and stand up slowly. Spend a few minutes getting ready to sit and then stand. This can help you feel less dizzy. Lifestyle Do not drive, use machinery, or play sports until your doctor says it is okay. Do not drink alcohol. Do not smoke or use any products that contain nicotine or tobacco. If you need help quitting, ask your doctor. Avoid hot tubs and saunas. General instructions Talk with your doctor about your symptoms. You may need to have testing to help find the cause. Drink enough fluid to keep your pee  (urine) pale yellow. Avoid standing for a long time. If you must stand for a long time, do movements such as: Moving your legs. Crossing your legs. Flexing and stretching your leg muscles. Squatting. Keep all follow-up visits. Contact a doctor if: You have episodes of near fainting. Get help right away if: You pass out or faint. You hit your head or are injured after fainting. You have any of these symptoms: Fast or uneven heartbeats (palpitations). Pain in your chest, belly, or back. Shortness of breath. You have jerky movements that you cannot control (seizure). You have a very bad headache. You are confused. You have problems with how you see (vision). You are very weak. You have trouble walking. You are bleeding from your mouth or your butt (rectum). You have black or tarry poop (stool). These symptoms may be an emergency. Get help right away. Call your local emergency services (911 in the [place] Do not wait to see if the symptoms will go away. Do not drive yourself to the hospital. Summary Syncope is when you pass out or faint for a short time. It is caused by a sudden decrease in blood flow to the brain. Signs that you may be about to faint include feeling dizzy or light-headed, feeling like you may vomit, seeing all white or all black, or having cold, clammy skin. If you start to feel like you might faint, sit or lie down right away. Lower your head if sitting, or raise (elevate)   your feet if lying down. Breathe deeply and steadily. Wait until all of the symptoms are gone. This information is not intended to replace advice given to you by your health care provider. Make sure you discuss any questions you have with your health care provider. Document Revised: 10/06/2020 Document Reviewed: 10/06/2020 Elsevier Patient Education  2023 Elsevier Inc.  

## 2022-08-09 ENCOUNTER — Encounter: Payer: Self-pay | Admitting: Neurology

## 2022-08-14 ENCOUNTER — Other Ambulatory Visit: Payer: Self-pay | Admitting: Nurse Practitioner

## 2022-08-14 DIAGNOSIS — F411 Generalized anxiety disorder: Secondary | ICD-10-CM

## 2022-08-21 ENCOUNTER — Other Ambulatory Visit: Payer: Self-pay | Admitting: Nurse Practitioner

## 2022-08-21 ENCOUNTER — Ambulatory Visit: Payer: 59 | Admitting: Nurse Practitioner

## 2022-08-21 ENCOUNTER — Encounter: Payer: Self-pay | Admitting: Nurse Practitioner

## 2022-08-21 VITALS — BP 129/77 | HR 70 | Temp 97.8°F | Resp 20 | Ht 64.0 in | Wt 201.0 lb

## 2022-08-21 DIAGNOSIS — E1142 Type 2 diabetes mellitus with diabetic polyneuropathy: Secondary | ICD-10-CM | POA: Diagnosis not present

## 2022-08-21 LAB — BAYER DCA HB A1C WAIVED: HB A1C (BAYER DCA - WAIVED): 9.2 % — ABNORMAL HIGH (ref 4.8–5.6)

## 2022-08-21 MED ORDER — RYBELSUS 7 MG PO TABS: 7.0000 mg | ORAL_TABLET | Freq: Every day | ORAL | 1 refills | Status: DC

## 2022-08-21 NOTE — Progress Notes (Signed)
Subjective:    Patient ID: [name], female    DOB: 1960/06/22, 62 y.o.   MRN: [id number]   Chief Complaint: diabetes  Diabetes Pertinent negatives for hypoglycemia include no dizziness or headaches. Pertinent negatives for diabetes include no chest pain, no polydipsia and no weakness.    Patient was seen on 07/23/22 for follow up of chronic medical issues. She had not been watching her diet, had been sick and on steroids. Her hgba1c was 13.1. we put her back on rybellisus and strict low carb diet. She has not been checking her blood sugars at home.     Was seen 08/03/22 with syncopal episodes. She said she thought she was having seizures, referral was made to neurology. She has an appointment with them on March 21,2024. Patient says she can't afford to go so she cancelled appointment. She says she has had only one near episode since last seen.    Patient Active Problem List   Diagnosis Date Noted   Carpal tunnel syndrome 08/16/2021   Numbness and tingling in right hand 07/11/2021   Diverticulosis 07/13/2016   Morbid obesity ([place]) 03/31/2015   Hyperlipidemia    Hypothyroidism    Hypertension    POLYP, COLON 04/13/2010   Diabetes ([place]) 04/13/2010   Anxiety state 04/13/2010   Depression 04/13/2010   Asthma 04/13/2010   Migraines 04/13/2010       Review of Systems  Constitutional:  Negative for diaphoresis.  Eyes:  Negative for pain.  Respiratory:  Negative for shortness of breath.   Cardiovascular:  Negative for chest pain, palpitations and leg swelling.  Gastrointestinal:  Negative for abdominal pain.  Endocrine: Negative for polydipsia.  Skin:  Negative for rash.  Neurological:  Negative for dizziness, weakness and headaches.  Hematological:  Does not bruise/bleed easily.  All other systems reviewed and are negative.      Objective:   Physical Exam Vitals and nursing note reviewed.  Constitutional:      General: She is not in acute distress.     Appearance: Normal appearance. She is well-developed.  Neck:     Vascular: No carotid bruit or JVD.  Cardiovascular:     Rate and Rhythm: Normal rate and regular rhythm.     Heart sounds: Normal heart sounds.  Pulmonary:     Effort: Pulmonary effort is normal. No respiratory distress.     Breath sounds: Normal breath sounds. No wheezing or rales.  Chest:     Chest wall: No tenderness.  Abdominal:     General: Bowel sounds are normal. There is no distension or abdominal bruit.     Palpations: Abdomen is soft. There is no hepatomegaly, splenomegaly, mass or pulsatile mass.     Tenderness: There is no abdominal tenderness.  Musculoskeletal:        General: Normal range of motion.     Cervical back: Normal range of motion and neck supple.  Lymphadenopathy:     Cervical: No cervical adenopathy.  Skin:    General: Skin is warm and dry.  Neurological:     Mental Status: She is alert and oriented to person, place, and time.     Deep Tendon Reflexes: Reflexes are normal and symmetric.  Psychiatric:        Behavior: Behavior normal.        Thought Content: Thought content normal.        Judgment: Judgment normal.     BP 129/77   Pulse 70   Temp 97.8  F (36.6 C) (Temporal)   Resp 20   Ht '5\' 4"'$  (1.626 m)   Wt 201 lb (91.2 kg)   SpO2 99%   BMI 34.50 kg/m   HGBA1c 9.2%      Assessment & Plan:   [name] in today with chief complaint of Diabetes   1. Type 2 diabetes mellitus with diabetic polyneuropathy, without long-term current use of insulin (HCC) Continue to watch carbs in diet Exercise encouraged Follow  up In 1 month - Bayer DCA Hb A1c Waived    The above assessment and management plan was discussed with the patient. The patient verbalized understanding of and has agreed to the management plan. Patient is aware to call the clinic if symptoms persist or worsen. Patient is aware when to return to the clinic for a follow-up visit. Patient educated on when  it is appropriate to go to the emergency department.   Mary Hassell Done, FNP

## 2022-08-22 NOTE — Telephone Encounter (Signed)
Rybelsus is not covered by patient's insurance.  Would you like to change or prefer [place] to try to get a prior authorization?

## 2022-08-23 NOTE — Telephone Encounter (Signed)
Please check on prior auth

## 2022-08-28 DIAGNOSIS — E113293 Type 2 diabetes mellitus with mild nonproliferative diabetic retinopathy without macular edema, bilateral: Secondary | ICD-10-CM | POA: Diagnosis not present

## 2022-08-28 DIAGNOSIS — H2513 Age-related nuclear cataract, bilateral: Secondary | ICD-10-CM | POA: Diagnosis not present

## 2022-08-28 DIAGNOSIS — H35363 Drusen (degenerative) of macula, bilateral: Secondary | ICD-10-CM | POA: Diagnosis not present

## 2022-08-28 LAB — HM DIABETES EYE EXAM

## 2022-08-30 ENCOUNTER — Ambulatory Visit: Payer: Self-pay | Admitting: Neurology

## 2022-10-10 ENCOUNTER — Encounter (INDEPENDENT_AMBULATORY_CARE_PROVIDER_SITE_OTHER): Payer: 59 | Admitting: Ophthalmology

## 2022-10-10 DIAGNOSIS — I10 Essential (primary) hypertension: Secondary | ICD-10-CM | POA: Diagnosis not present

## 2022-10-10 DIAGNOSIS — H34811 Central retinal vein occlusion, right eye, with macular edema: Secondary | ICD-10-CM

## 2022-10-10 DIAGNOSIS — H353132 Nonexudative age-related macular degeneration, bilateral, intermediate dry stage: Secondary | ICD-10-CM

## 2022-10-10 DIAGNOSIS — H43813 Vitreous degeneration, bilateral: Secondary | ICD-10-CM

## 2022-10-10 DIAGNOSIS — H35033 Hypertensive retinopathy, bilateral: Secondary | ICD-10-CM | POA: Diagnosis not present

## 2022-10-16 ENCOUNTER — Telehealth: Payer: Self-pay | Admitting: Nurse Practitioner

## 2022-10-16 DIAGNOSIS — G4730 Sleep apnea, unspecified: Secondary | ICD-10-CM

## 2022-10-16 NOTE — Telephone Encounter (Signed)
Patient said she had a missed call from our office questioning if she had a CPAP machine and she said she does not. She would need a sleep study done. Unable to find a telephone encounter about this.

## 2022-10-16 NOTE — Telephone Encounter (Signed)
Patient does not have a CPAP and would like a sleep study. Please refer

## 2022-11-05 ENCOUNTER — Other Ambulatory Visit: Payer: Self-pay | Admitting: Nurse Practitioner

## 2022-11-05 DIAGNOSIS — G43711 Chronic migraine without aura, intractable, with status migrainosus: Secondary | ICD-10-CM

## 2022-11-22 ENCOUNTER — Institutional Professional Consult (permissible substitution): Payer: 59 | Admitting: Neurology

## 2022-11-26 ENCOUNTER — Ambulatory Visit: Payer: 59 | Admitting: Nurse Practitioner

## 2022-11-26 ENCOUNTER — Encounter: Payer: Self-pay | Admitting: Nurse Practitioner

## 2022-11-26 VITALS — BP 115/77 | HR 98 | Temp 97.8°F | Resp 20 | Ht 64.0 in | Wt 205.0 lb

## 2022-11-26 DIAGNOSIS — E039 Hypothyroidism, unspecified: Secondary | ICD-10-CM | POA: Diagnosis not present

## 2022-11-26 DIAGNOSIS — G43411 Hemiplegic migraine, intractable, with status migrainosus: Secondary | ICD-10-CM | POA: Diagnosis not present

## 2022-11-26 DIAGNOSIS — I10 Essential (primary) hypertension: Secondary | ICD-10-CM

## 2022-11-26 DIAGNOSIS — E782 Mixed hyperlipidemia: Secondary | ICD-10-CM | POA: Diagnosis not present

## 2022-11-26 DIAGNOSIS — K5790 Diverticulosis of intestine, part unspecified, without perforation or abscess without bleeding: Secondary | ICD-10-CM

## 2022-11-26 DIAGNOSIS — F411 Generalized anxiety disorder: Secondary | ICD-10-CM

## 2022-11-26 DIAGNOSIS — J45909 Unspecified asthma, uncomplicated: Secondary | ICD-10-CM

## 2022-11-26 DIAGNOSIS — G250 Essential tremor: Secondary | ICD-10-CM

## 2022-11-26 DIAGNOSIS — Z7984 Long term (current) use of oral hypoglycemic drugs: Secondary | ICD-10-CM

## 2022-11-26 DIAGNOSIS — E119 Type 2 diabetes mellitus without complications: Secondary | ICD-10-CM | POA: Diagnosis not present

## 2022-11-26 DIAGNOSIS — E1142 Type 2 diabetes mellitus with diabetic polyneuropathy: Secondary | ICD-10-CM | POA: Diagnosis not present

## 2022-11-26 DIAGNOSIS — F3341 Major depressive disorder, recurrent, in partial remission: Secondary | ICD-10-CM

## 2022-11-26 MED ORDER — LISINOPRIL 10 MG PO TABS
10.0000 mg | ORAL_TABLET | Freq: Every day | ORAL | 1 refills | Status: DC
Start: 2022-11-26 — End: 2023-01-11

## 2022-11-26 MED ORDER — PROPRANOLOL HCL 40 MG PO TABS
40.0000 mg | ORAL_TABLET | Freq: Two times a day (BID) | ORAL | 1 refills | Status: DC
Start: 2022-11-26 — End: 2023-01-11

## 2022-11-26 MED ORDER — GLIPIZIDE ER 10 MG PO TB24: 10.0000 mg | ORAL_TABLET | Freq: Every day | ORAL | 1 refills | Status: DC

## 2022-11-26 MED ORDER — TOPIRAMATE 50 MG PO TABS
50.0000 mg | ORAL_TABLET | Freq: Two times a day (BID) | ORAL | 1 refills | Status: DC
Start: 2022-11-26 — End: 2023-04-04

## 2022-11-26 MED ORDER — BUPROPION HCL ER (XL) 150 MG PO TB24: 450.0000 mg | ORAL_TABLET | Freq: Every day | ORAL | 1 refills | Status: DC

## 2022-11-26 MED ORDER — BENAZEPRIL HCL 20 MG PO TABS: 20.0000 mg | ORAL_TABLET | Freq: Every day | ORAL | 1 refills | Status: DC

## 2022-11-26 MED ORDER — METFORMIN HCL 500 MG PO TABS: 500.0000 mg | ORAL_TABLET | Freq: Two times a day (BID) | ORAL | 1 refills | Status: DC

## 2022-11-26 MED ORDER — FENOFIBRATE 160 MG PO TABS: 160.0000 mg | ORAL_TABLET | Freq: Every day | ORAL | 1 refills | Status: DC

## 2022-11-26 MED ORDER — ROSUVASTATIN CALCIUM 10 MG PO TABS
10.0000 mg | ORAL_TABLET | Freq: Every day | ORAL | 3 refills | Status: DC
Start: 2022-11-26 — End: 2022-12-27

## 2022-11-26 NOTE — Progress Notes (Signed)
Subjective:    Patient ID: [name], female    DOB: February 04, 1961, 62 y.o.   MRN: [id number]   Chief Complaint: Medical Management of Chronic Issues (Arm pain that radiates across to other arm)    HPI:  [name] is a 62 y.o. who identifies as a female who was assigned female at birth.   Social history: Lives with: by herself Work history: [employer]   Comes in today for follow up of the following chronic medical issues:  1. Primary hypertension No c/o chest pain, sob or headache. Does not check blood pressure at home. BP Readings from Last 3 Encounters:  11/26/22 115/77  08/21/22 129/77  08/07/22 (!) 142/67     2. Mixed hyperlipidemia Has been watching diet but very seldom exercises.is on crestor daily Lab Results  Component Value Date   CHOL 378 (H) 07/23/2022   HDL 41 07/23/2022   LDLCALC 221 (H) 07/23/2022   LDLDIRECT 211 (H) 04/28/2019   TRIG 523 (H) 07/23/2022   CHOLHDL 9.2 (H) 07/23/2022     3. Diabetes mellitus treated with oral medication (HCC) She has not been checking er blood sugars at home. Lab Results  Component Value Date   HGBA1C 9.2 (H) 08/21/2022     4. Diverticulosis No recent flare ups  5. Acquired hypothyroidism No issues that aware of Lab Results  Component Value Date   TSH 2.600 07/23/2022     6. Intractable hemiplegic migraine with status migrainosus Has not had any recently  7. Mild asthma without complication, unspecified whether persistent No recent flare ups. Is on topamax daily and maxalt when needed  8. Anxiety state 9. Recurrent major depressive disorder, in partial remission (HCC) Is on wellbutrin and is doing well.    11/26/2022    4:20 PM 08/21/2022    3:47 PM 08/07/2022    2:27 PM 07/23/2022    4:23 PM  GAD 7 : Generalized Anxiety Score  Nervous, Anxious, on Edge 0 1 0 0  Control/stop worrying 0 1 0 0  Worry too much - different things 0 1 1 1   Trouble relaxing 0 1 1 1   Restless 0 0 0  0  Easily annoyed or irritable 0 0 0 0  Afraid - awful might happen 0 0 0 0  Total GAD 7 Score 0 4 2 2   Anxiety Difficulty Not difficult at all Very difficult Very difficult Somewhat difficult       11/26/2022    4:20 PM 08/21/2022    3:47 PM 08/07/2022    2:25 PM  Depression screen PHQ 2/9  Decreased Interest 0 1 1  Down, Depressed, Hopeless 0 1 0  PHQ - 2 Score 0 2 1  Altered sleeping 0 1 1  Tired, decreased energy 0 1 1  Change in appetite 0 1 0  Feeling bad or failure about yourself  0 1 0  Trouble concentrating 0 0 0  Moving slowly or fidgety/restless 0 0 0  Suicidal thoughts 0 0 0  PHQ-9 Score 0 6 3  Difficult doing work/chores Not difficult at all Very difficult Very difficult     10. Morbid obesity (HCC) No recent weight changes Wt Readings from Last 3 Encounters:  11/26/22 205 lb (93 kg)  08/21/22 201 lb (91.2 kg)  08/07/22 203 lb (92.1 kg)   BMI Readings from Last 3 Encounters:  11/26/22 35.19 kg/m  08/21/22 34.50 kg/m  08/07/22 34.84 kg/m      New complaints:  None  today  Allergies  Allergen Reactions   Aspirin Anaphylaxis   Bee Venom Anaphylaxis   Benadryl [Diphenhydramine Hcl] Anaphylaxis   Morphine And Codeine Shortness Of Breath and Itching   Vicks Formula 44 Cough-Cold Pm [Dm-Apap-Cpm] Shortness Of Breath and Rash    Not anaphylaxis   Outpatient Encounter Medications as of 11/26/2022  Medication Sig   albuterol (VENTOLIN HFA) 108 (90 Base) MCG/ACT inhaler Inhale 2 puffs into the lungs every 4 (four) hours as needed for wheezing or shortness of breath. (NEEDS TO BE SEEN)   benazepril (LOTENSIN) 20 MG tablet Take 1 tablet (20 mg total) by mouth daily.   buPROPion (WELLBUTRIN XL) 150 MG 24 hr tablet TAKE 3 TABLETS BY MOUTH DAILY.   EPINEPHrine 0.3 mg/0.3 mL IJ SOAJ injection Inject 0.3 mg into the muscle as needed for anaphylaxis.   fenofibrate 160 MG tablet Take 1 tablet (160 mg total) by mouth daily.   glipiZIDE (GLUCOTROL XL) 10 MG 24 hr  tablet Take 1 tablet (10 mg total) by mouth daily.   lisinopril (ZESTRIL) 10 MG tablet Take 1 tablet (10 mg total) by mouth daily.   meclizine (ANTIVERT) 25 MG tablet Take 1 tablet (25 mg total) by mouth 2 (two) times daily as needed for dizziness.   ondansetron (ZOFRAN) 4 MG tablet Take 1 tablet (4 mg total) by mouth every 8 (eight) hours as needed for nausea or vomiting.   propranolol (INDERAL) 40 MG tablet Take 1 tablet (40 mg total) by mouth 2 (two) times daily. (Needs to be seen before next refill)   rizatriptan (MAXALT) 10 MG tablet Take 1 tablet (10 mg total) by mouth as needed for migraine. May repeat in 2 hours if needed   rosuvastatin (CRESTOR) 10 MG tablet Take 1 tablet (10 mg total) by mouth daily.   RYBELSUS 7 MG TABS TAKE 1 TABLET (7 MG TOTAL) BY MOUTH DAILY   topiramate (TOPAMAX) 50 MG tablet TAKE 1 TABLET BY MOUTH TWICE A DAY   [DISCONTINUED] gabapentin (NEURONTIN) 300 MG capsule Take 1 capsule (300 mg total) by mouth 3 (three) times daily.   [DISCONTINUED] trimethoprim-polymyxin b (POLYTRIM) ophthalmic solution Place 2 drops into both eyes every 4 (four) hours.   No facility-administered encounter medications on file as of 11/26/2022.    Past Surgical History:  Procedure Laterality Date   ABDOMINAL HYSTERECTOMY  02/10/2007   + lysis of adhesions   APPENDECTOMY  1990s   ruptured, late 90s   BILATERAL OOPHORECTOMY      in 2 surgeries prior to 9/08   CARPAL TUNNEL RELEASE Right 08/16/2021   Procedure: RIGHT CARPAL TUNNEL RELEASE;  Surgeon: Marlyne Beards, MD;  Location: [facility];  Service: Orthopedics;  Laterality: Right;   COLONOSCOPY  02/09/2010    normal upper endoscopy, single colonic polyp,   HERNIA REPAIR     VENTRAL HERNIA REPAIR     x3    History reviewed. No pertinent family history.    Controlled substance contract: n/a     Review of Systems  Constitutional:  Negative for diaphoresis.  Eyes:  Negative for pain.  Respiratory:   Negative for shortness of breath.   Cardiovascular:  Negative for chest pain, palpitations and leg swelling.  Gastrointestinal:  Negative for abdominal pain.  Endocrine: Negative for polydipsia.  Skin:  Negative for rash.  Neurological:  Negative for dizziness, weakness and headaches.  Hematological:  Does not bruise/bleed easily.  All other systems reviewed and are negative.  Objective:   Physical Exam Vitals and nursing note reviewed.  Constitutional:      General: She is not in acute distress.    Appearance: Normal appearance. She is well-developed.  HENT:     Head: Normocephalic.     Right Ear: Tympanic membrane normal.     Left Ear: Tympanic membrane normal.     Nose: Nose normal.     Mouth/Throat:     Mouth: Mucous membranes are moist.  Eyes:     Pupils: Pupils are equal, round, and reactive to light.  Neck:     Vascular: No carotid bruit or JVD.  Cardiovascular:     Rate and Rhythm: Normal rate and regular rhythm.     Heart sounds: Normal heart sounds.  Pulmonary:     Effort: Pulmonary effort is normal. No respiratory distress.     Breath sounds: Normal breath sounds. No wheezing or rales.  Chest:     Chest wall: No tenderness.  Abdominal:     General: Bowel sounds are normal. There is no distension or abdominal bruit.     Palpations: Abdomen is soft. There is no hepatomegaly, splenomegaly, mass or pulsatile mass.     Tenderness: There is no abdominal tenderness.  Musculoskeletal:        General: Normal range of motion.     Cervical back: Normal range of motion and neck supple.  Lymphadenopathy:     Cervical: No cervical adenopathy.  Skin:    General: Skin is warm and dry.  Neurological:     Mental Status: She is alert and oriented to person, place, and time.     Deep Tendon Reflexes: Reflexes are normal and symmetric.  Psychiatric:        Behavior: Behavior normal.        Thought Content: Thought content normal.        Judgment: Judgment normal.     BP 115/77   Pulse 98   Temp 97.8 F (36.6 C) (Temporal)   Resp 20   Ht 5\' 4"  (1.626 m)   Wt 205 lb (93 kg)   SpO2 96%   BMI 35.19 kg/m   Hgba1c 9.1%       Assessment & Plan:   [name] comes in today with chief complaint of Medical Management of Chronic Issues (Arm pain that radiates across to other arm)   Diagnosis and orders addressed:  1. Primary hypertension Low sodium diet - CBC with Differential/Platelet - CMP14+EGFR - benazepril (LOTENSIN) 20 MG tablet; Take 1 tablet (20 mg total) by mouth daily.  Dispense: 90 tablet; Refill: 1  2. Mixed hyperlipidemia Low fat diet - Lipid panel - fenofibrate 160 MG tablet; Take 1 tablet (160 mg total) by mouth daily.  Dispense: 90 tablet; Refill: 1 - rosuvastatin (CRESTOR) 10 MG tablet; Take 1 tablet (10 mg total) by mouth daily.  Dispense: 90 tablet; Refill: 3  3. Diabetes mellitus treated with oral medication (HCC) Stricter carb counting Back on metformin - Bayer DCA Hb A1c Waived - lisinopril (ZESTRIL) 10 MG tablet; Take 1 tablet (10 mg total) by mouth daily.  Dispense: 90 tablet; Refill: 1 - glipiZIDE (GLUCOTROL XL) 10 MG 24 hr tablet; Take 1 tablet (10 mg total) by mouth daily.  Dispense: 180 tablet; Refill: 1  4. Diverticulosis Watch diet  5. Acquired hypothyroidism Labs pending  6. Intractable hemiplegic migraine with status migrainosus Avoid caffeine - topiramate (TOPAMAX) 50 MG tablet; Take 1 tablet (50 mg total) by mouth 2 (  two) times daily.  Dispense: 180 tablet; Refill: 1  7. Mild asthma without complication, unspecified whether persistent  8. Anxiety state Stress management - buPROPion (WELLBUTRIN XL) 150 MG 24 hr tablet; Take 3 tablets (450 mg total) by mouth daily.  Dispense: 270 tablet; Refill: 1  9. Recurrent major depressive disorder, in partial remission (HCC)  10. Morbid obesity (HCC) Discussed diet and exercise for person with BMI >25 Will recheck weight in 3-6  months   11. Essential tremor - propranolol (INDERAL) 40 MG tablet; Take 1 tablet (40 mg total) by mouth 2 (two) times daily. (Needs to be seen before next refill)  Dispense: 180 tablet; Refill: 1   Labs pending Health Maintenance reviewed Diet and exercise encouraged  Follow up plan: 3 months   Mary-Margaret Daphine Deutscher, FNP

## 2022-11-27 LAB — CBC WITH DIFFERENTIAL/PLATELET
Basophils Absolute: 0.1 10*3/uL (ref 0.0–0.2)
Basos: 1 %
EOS (ABSOLUTE): 0.1 10*3/uL (ref 0.0–0.4)
Eos: 1 %
Hematocrit: 37.6 % (ref 34.0–46.6)
Hemoglobin: 12.3 g/dL (ref 11.1–15.9)
Immature Grans (Abs): 0.1 10*3/uL (ref 0.0–0.1)
Immature Granulocytes: 1 %
Lymphocytes Absolute: 3.3 10*3/uL — ABNORMAL HIGH (ref 0.7–3.1)
Lymphs: 38 %
MCH: 28.7 pg (ref 26.6–33.0)
MCHC: 32.7 g/dL (ref 31.5–35.7)
MCV: 88 fL (ref 79–97)
Monocytes Absolute: 0.6 10*3/uL (ref 0.1–0.9)
Monocytes: 7 %
Neutrophils Absolute: 4.5 10*3/uL (ref 1.4–7.0)
Neutrophils: 52 %
Platelets: 216 10*3/uL (ref 150–450)
RBC: 4.29 x10E6/uL (ref 3.77–5.28)
RDW: 14.4 % (ref 11.7–15.4)
WBC: 8.6 10*3/uL (ref 3.4–10.8)

## 2022-11-27 LAB — CMP14+EGFR
ALT: 26 IU/L (ref 0–32)
AST: 16 IU/L (ref 0–40)
Albumin: 4.6 g/dL (ref 3.9–4.9)
Alkaline Phosphatase: 76 IU/L (ref 44–121)
BUN/Creatinine Ratio: 25 (ref 12–28)
BUN: 28 mg/dL — ABNORMAL HIGH (ref 8–27)
Bilirubin Total: <0.2 mg/dL (ref 0.0–1.2)
CO2: 21 mmol/L (ref 20–29)
Calcium: 10 mg/dL (ref 8.7–10.3)
Chloride: 102 mmol/L (ref 96–106)
Creatinine, Ser: 1.13 mg/dL — ABNORMAL HIGH (ref 0.57–1.00)
Globulin, Total: 2.5 g/dL (ref 1.5–4.5)
Glucose: 222 mg/dL — ABNORMAL HIGH (ref 70–99)
Potassium: 4.4 mmol/L (ref 3.5–5.2)
Sodium: 138 mmol/L (ref 134–144)
Total Protein: 7.1 g/dL (ref 6.0–8.5)
eGFR: 55 mL/min/{1.73_m2} — ABNORMAL LOW (ref >59)

## 2022-11-27 LAB — LIPID PANEL
Chol/HDL Ratio: 7.6 ratio — ABNORMAL HIGH (ref 0.0–4.4)
Cholesterol, Total: 365 mg/dL — ABNORMAL HIGH (ref 100–199)
HDL: 48 mg/dL (ref >39)
LDL Chol Calc (NIH): 175 mg/dL — ABNORMAL HIGH (ref 0–99)
Triglycerides: 681 mg/dL (ref 0–149)
VLDL Cholesterol Cal: 142 mg/dL — ABNORMAL HIGH (ref 5–40)

## 2022-11-27 LAB — BAYER DCA HB A1C WAIVED: HB A1C (BAYER DCA - WAIVED): 9.1 % — ABNORMAL HIGH (ref 4.8–5.6)

## 2022-12-05 ENCOUNTER — Institutional Professional Consult (permissible substitution): Payer: 59 | Admitting: Neurology

## 2022-12-10 ENCOUNTER — Other Ambulatory Visit: Payer: Self-pay | Admitting: Nurse Practitioner

## 2022-12-10 DIAGNOSIS — F411 Generalized anxiety disorder: Secondary | ICD-10-CM

## 2022-12-21 ENCOUNTER — Telehealth (INDEPENDENT_AMBULATORY_CARE_PROVIDER_SITE_OTHER): Payer: 59 | Admitting: Nurse Practitioner

## 2022-12-21 ENCOUNTER — Encounter: Payer: Self-pay | Admitting: Nurse Practitioner

## 2022-12-21 ENCOUNTER — Telehealth: Payer: Self-pay | Admitting: Nurse Practitioner

## 2022-12-21 DIAGNOSIS — M79602 Pain in left arm: Secondary | ICD-10-CM | POA: Diagnosis not present

## 2022-12-21 MED ORDER — PREDNISONE 10 MG (21) PO TBPK: ORAL_TABLET | ORAL | 0 refills | Status: DC

## 2022-12-21 NOTE — Patient Instructions (Signed)
[name], thank you for joining Bennie Pierini, FNP for today's virtual visit.  While this provider is not your primary care provider (PCP), if your PCP is located in our provider database this encounter information will be shared with them immediately following your visit.   A [facility] MyChart account gives you access to today's visit and all your visits, tests, and labs performed at [facility] " click here if you don't have a [facility] MyChart account or go to [email or web address]  Consent: (Patient) [name] provided verbal consent for this virtual visit at the beginning of the encounter.  Current Medications:  Current Outpatient Medications:    predniSONE (STERAPRED UNI-PAK 21 TAB) 10 MG (21) TBPK tablet, As directed x 6 days, Disp: 21 tablet, Rfl: 0   albuterol (VENTOLIN HFA) 108 (90 Base) MCG/ACT inhaler, Inhale 2 puffs into the lungs every 4 (four) hours as needed for wheezing or shortness of breath. (NEEDS TO BE SEEN), Disp: 18 g, Rfl: 1   benazepril (LOTENSIN) 20 MG tablet, Take 1 tablet (20 mg total) by mouth daily., Disp: 90 tablet, Rfl: 1   buPROPion (WELLBUTRIN XL) 150 MG 24 hr tablet, TAKE 3 TABLETS BY MOUTH DAILY., Disp: 270 tablet, Rfl: 0   EPINEPHrine 0.3 mg/0.3 mL IJ SOAJ injection, Inject 0.3 mg into the muscle as needed for anaphylaxis., Disp: 2 each, Rfl: 2   fenofibrate 160 MG tablet, Take 1 tablet (160 mg total) by mouth daily., Disp: 90 tablet, Rfl: 1   glipiZIDE (GLUCOTROL XL) 10 MG 24 hr tablet, Take 1 tablet (10 mg total) by mouth daily., Disp: 180 tablet, Rfl: 1   lisinopril (ZESTRIL) 10 MG tablet, Take 1 tablet (10 mg total) by mouth daily., Disp: 90 tablet, Rfl: 1   meclizine (ANTIVERT) 25 MG tablet, Take 1 tablet (25 mg total) by mouth 2 (two) times daily as needed for dizziness., Disp: 30 tablet, Rfl: 0   metFORMIN (GLUCOPHAGE) 500 MG tablet, Take 1 tablet (500 mg total) by mouth 2 (two) times daily with a  meal., Disp: 180 tablet, Rfl: 1   ondansetron (ZOFRAN) 4 MG tablet, Take 1 tablet (4 mg total) by mouth every 8 (eight) hours as needed for nausea or vomiting., Disp: 20 tablet, Rfl: 0   propranolol (INDERAL) 40 MG tablet, Take 1 tablet (40 mg total) by mouth 2 (two) times daily. (Needs to be seen before next refill), Disp: 180 tablet, Rfl: 1   rizatriptan (MAXALT) 10 MG tablet, Take 1 tablet (10 mg total) by mouth as needed for migraine. May repeat in 2 hours if needed, Disp: 10 tablet, Rfl: 2   rosuvastatin (CRESTOR) 10 MG tablet, Take 1 tablet (10 mg total) by mouth daily., Disp: 90 tablet, Rfl: 3   RYBELSUS 7 MG TABS, TAKE 1 TABLET (7 MG TOTAL) BY MOUTH DAILY, Disp: 90 tablet, Rfl: 1   topiramate (TOPAMAX) 50 MG tablet, Take 1 tablet (50 mg total) by mouth 2 (two) times daily., Disp: 180 tablet, Rfl: 1   Medications ordered in this encounter:  Meds ordered this encounter  Medications   predniSONE (STERAPRED UNI-PAK 21 TAB) 10 MG (21) TBPK tablet    Sig: As directed x 6 days    Dispense:  21 tablet    Refill:  0    Order Specific Question:   Supervising Provider    Answer:   Nils Pyle [[id number]]     *If you need refills on other medications prior to your next appointment,  please contact your pharmacy*  Follow-Up: Call back or seek an in-person evaluation if the symptoms worsen or if the condition fails to improve as anticipated.  [facility] [phone]  Other Instructions Moist heat Rest Steroids If is not improving by MOnday or develops SOB need to go to the ED.   If you have been instructed to have an in-person evaluation today at a local Urgent Care facility, please use the link below. It will take you to a list of all of our available [facility], including address, phone number and hours of operation. Please do not delay care.  [facility]  If you or a family member do not have a primary care provider, use the link below to  schedule a visit and establish care. When you choose a [facility] primary care physician or advanced practice provider, you gain a long-term partner in health. Find a Primary Care Provider  Learn more about [facility] in-office and virtual care options: [facility] - Get Care Now

## 2022-12-21 NOTE — Progress Notes (Signed)
Virtual Visit Consent   [name], you are scheduled for a virtual visit with Mary-Margaret Daphine Deutscher, FNP, a [facility] provider, today.     Just as with appointments in the office, your consent must be obtained to participate.  Your consent will be active for this visit and any virtual visit you may have with one of our providers in the next 365 days.     If you have a MyChart account, a copy of this consent can be sent to you electronically.  All virtual visits are billed to your insurance company just like a traditional visit in the office.    As this is a virtual visit, video technology does not allow for your provider to perform a traditional examination.  This may limit your provider's ability to fully assess your condition.  If your provider identifies any concerns that need to be evaluated in person or the need to arrange testing (such as labs, EKG, etc.), we will make arrangements to do so.     Although advances in technology are sophisticated, we cannot ensure that it will always work on either your end or our end.  If the connection with a video visit is poor, the visit may have to be switched to a telephone visit.  With either a video or telephone visit, we are not always able to ensure that we have a secure connection.     I need to obtain your verbal consent now.   Are you willing to proceed with your visit today? YES   [name] has provided verbal consent on 12/21/2022 for a virtual visit (video or telephone).   Mary-Margaret Daphine Deutscher, FNP   Date: 12/21/2022 1:30 PM   Virtual Visit via Video Note   I, Mary-Margaret Daphine Deutscher, connected with [name] ([id number], [birth year]) on 12/21/22 at  4:15 PM EDT by a video-enabled telemedicine application and verified that I am speaking with the correct person using two identifiers.  Location: Patient: Virtual Visit Location Patient: Home Provider: Virtual Visit Location Provider: Mobile   I discussed  the limitations of evaluation and management by telemedicine and the availability of in person appointments. The patient expressed understanding and agreed to proceed.    History of Present Illness: [name] is a 62 y.o. who identifies as a female who was assigned female at birth, and is being seen today for arm pain.  HPI: Patient c/o worsening left arm pain. Is intermittent but is daily. Pain radiates to left shoulder and across back and chest. Is a sharp pain. Rates pain 9-10/10 with worsening with movement or use.     Review of Systems  Constitutional: Negative.   HENT: Negative.    Respiratory:  Negative for shortness of breath.   Cardiovascular: Negative.  Negative for chest pain.  Genitourinary: Negative.   Neurological: Negative.   Psychiatric/Behavioral: Negative.      Problems:  Patient Active Problem List   Diagnosis Date Noted   Carpal tunnel syndrome 08/16/2021   Numbness and tingling in right hand 07/11/2021   Diverticulosis 07/13/2016   Morbid obesity (HCC) 03/31/2015   Hyperlipidemia    Hypothyroidism    Hypertension    POLYP, COLON 04/13/2010   Diabetes (HCC) 04/13/2010   Anxiety state 04/13/2010   Depression 04/13/2010   Asthma 04/13/2010   Migraines 04/13/2010    Allergies:  Allergies  Allergen Reactions   Aspirin Anaphylaxis   Bee Venom Anaphylaxis   Benadryl [Diphenhydramine Hcl] Anaphylaxis   Morphine  And Codeine Shortness Of Breath and Itching   Vicks Formula 44 Cough-Cold Pm [Dm-Apap-Cpm] Shortness Of Breath and Rash    Not anaphylaxis   Medications:  Current Outpatient Medications:    albuterol (VENTOLIN HFA) 108 (90 Base) MCG/ACT inhaler, Inhale 2 puffs into the lungs every 4 (four) hours as needed for wheezing or shortness of breath. (NEEDS TO BE SEEN), Disp: 18 g, Rfl: 1   benazepril (LOTENSIN) 20 MG tablet, Take 1 tablet (20 mg total) by mouth daily., Disp: 90 tablet, Rfl: 1   buPROPion (WELLBUTRIN XL) 150 MG 24 hr tablet, TAKE  3 TABLETS BY MOUTH DAILY., Disp: 270 tablet, Rfl: 0   EPINEPHrine 0.3 mg/0.3 mL IJ SOAJ injection, Inject 0.3 mg into the muscle as needed for anaphylaxis., Disp: 2 each, Rfl: 2   fenofibrate 160 MG tablet, Take 1 tablet (160 mg total) by mouth daily., Disp: 90 tablet, Rfl: 1   glipiZIDE (GLUCOTROL XL) 10 MG 24 hr tablet, Take 1 tablet (10 mg total) by mouth daily., Disp: 180 tablet, Rfl: 1   lisinopril (ZESTRIL) 10 MG tablet, Take 1 tablet (10 mg total) by mouth daily., Disp: 90 tablet, Rfl: 1   meclizine (ANTIVERT) 25 MG tablet, Take 1 tablet (25 mg total) by mouth 2 (two) times daily as needed for dizziness., Disp: 30 tablet, Rfl: 0   metFORMIN (GLUCOPHAGE) 500 MG tablet, Take 1 tablet (500 mg total) by mouth 2 (two) times daily with a meal., Disp: 180 tablet, Rfl: 1   ondansetron (ZOFRAN) 4 MG tablet, Take 1 tablet (4 mg total) by mouth every 8 (eight) hours as needed for nausea or vomiting., Disp: 20 tablet, Rfl: 0   propranolol (INDERAL) 40 MG tablet, Take 1 tablet (40 mg total) by mouth 2 (two) times daily. (Needs to be seen before next refill), Disp: 180 tablet, Rfl: 1   rizatriptan (MAXALT) 10 MG tablet, Take 1 tablet (10 mg total) by mouth as needed for migraine. May repeat in 2 hours if needed, Disp: 10 tablet, Rfl: 2   rosuvastatin (CRESTOR) 10 MG tablet, Take 1 tablet (10 mg total) by mouth daily., Disp: 90 tablet, Rfl: 3   RYBELSUS 7 MG TABS, TAKE 1 TABLET (7 MG TOTAL) BY MOUTH DAILY, Disp: 90 tablet, Rfl: 1   topiramate (TOPAMAX) 50 MG tablet, Take 1 tablet (50 mg total) by mouth 2 (two) times daily., Disp: 180 tablet, Rfl: 1  Observations/Objective: Patient is well-developed, well-nourished in no acute distress.  Resting comfortably at home.  Head is normocephalic, atraumatic.  No labored breathing. Speech is clear and coherent with logical content.  Patient is alert and oriented at baseline.  FROM of left arm with pain on extension and reaching upward  Assessment and  Plan:  [name] in today with chief complaint of Migraine   1. Left arm pain Moist heat Rest Steroids If is not improving by MOnday or develops SOB need to go to the ED.  Meds ordered this encounter  Medications   predniSONE (STERAPRED UNI-PAK 21 TAB) 10 MG (21) TBPK tablet    Sig: As directed x 6 days    Dispense:  21 tablet    Refill:  0    Order Specific Question:   Supervising Provider    Answer:   Arville Care A [[id number]]      Follow Up Instructions: I discussed the assessment and treatment plan with the patient. The patient was provided an opportunity to ask questions and all were answered. The  patient agreed with the plan and demonstrated an understanding of the instructions.  A copy of instructions were sent to the patient via MyChart.  The patient was advised to call back or seek an in-person evaluation if the symptoms worsen or if the condition fails to improve as anticipated.  Time:  I spent 20 minutes with the patient via telehealth technology discussing the above problems/concerns.    Mary-Margaret Daphine Deutscher, FNP

## 2022-12-21 NOTE — Telephone Encounter (Signed)
Called and spoke with patient. She wants to do a video visit instead of coming in. Appt scheduled for today

## 2022-12-21 NOTE — Telephone Encounter (Signed)
Patient calling because she has a severe migraine and would like a shot for it because no medication is helping. Offered appts with DOD since PCP was booked. She did not want to see anyone other then her PCP.   Stated that if she could not see her PCP she would go to the ER. Wanted nurse to call back. Made her aware that the appts offered may not be there if she called back or when the nurse called her back.

## 2022-12-23 DIAGNOSIS — R0789 Other chest pain: Secondary | ICD-10-CM | POA: Diagnosis not present

## 2022-12-23 DIAGNOSIS — R7989 Other specified abnormal findings of blood chemistry: Secondary | ICD-10-CM | POA: Diagnosis not present

## 2022-12-23 DIAGNOSIS — Z0000 Encounter for general adult medical examination without abnormal findings: Secondary | ICD-10-CM | POA: Diagnosis not present

## 2022-12-23 DIAGNOSIS — Z743 Need for continuous supervision: Secondary | ICD-10-CM | POA: Diagnosis not present

## 2022-12-23 DIAGNOSIS — R739 Hyperglycemia, unspecified: Secondary | ICD-10-CM | POA: Diagnosis not present

## 2022-12-23 DIAGNOSIS — M79603 Pain in arm, unspecified: Secondary | ICD-10-CM | POA: Diagnosis not present

## 2022-12-23 DIAGNOSIS — R079 Chest pain, unspecified: Secondary | ICD-10-CM | POA: Diagnosis not present

## 2022-12-23 DIAGNOSIS — I10 Essential (primary) hypertension: Secondary | ICD-10-CM | POA: Diagnosis not present

## 2022-12-24 ENCOUNTER — Telehealth: Payer: Self-pay | Admitting: Nurse Practitioner

## 2022-12-24 DIAGNOSIS — I10 Essential (primary) hypertension: Secondary | ICD-10-CM | POA: Diagnosis not present

## 2022-12-24 DIAGNOSIS — E785 Hyperlipidemia, unspecified: Secondary | ICD-10-CM | POA: Diagnosis not present

## 2022-12-24 DIAGNOSIS — E1169 Type 2 diabetes mellitus with other specified complication: Secondary | ICD-10-CM | POA: Diagnosis not present

## 2022-12-24 DIAGNOSIS — R079 Chest pain, unspecified: Secondary | ICD-10-CM | POA: Diagnosis not present

## 2022-12-24 DIAGNOSIS — Z1330 Encounter for screening examination for mental health and behavioral disorders, unspecified: Secondary | ICD-10-CM | POA: Diagnosis not present

## 2022-12-24 DIAGNOSIS — M79602 Pain in left arm: Secondary | ICD-10-CM

## 2022-12-24 NOTE — Telephone Encounter (Signed)
Needs to see cadrdiology for stress test slo they can order echo also. They may can do echo in their office.  Mary-Margaret Daphine Deutscher, FNP

## 2022-12-25 ENCOUNTER — Other Ambulatory Visit: Payer: Self-pay

## 2022-12-25 DIAGNOSIS — R079 Chest pain, unspecified: Secondary | ICD-10-CM

## 2022-12-25 NOTE — Telephone Encounter (Signed)
Urgent referral placed to cardiology and patient notified and verbalized understanding

## 2022-12-27 ENCOUNTER — Other Ambulatory Visit: Payer: Self-pay

## 2022-12-27 ENCOUNTER — Emergency Department (HOSPITAL_COMMUNITY): Payer: 59

## 2022-12-27 ENCOUNTER — Encounter (HOSPITAL_COMMUNITY): Payer: Self-pay

## 2022-12-27 ENCOUNTER — Inpatient Hospital Stay (HOSPITAL_COMMUNITY)
Admission: EM | Admit: 2022-12-27 | Discharge: 2023-01-11 | DRG: 233 | Disposition: A | Discharge status: Home / Self Care | Payer: 59 | Attending: Thoracic Surgery (Cardiothoracic Vascular Surgery) | Admitting: Thoracic Surgery (Cardiothoracic Vascular Surgery)

## 2022-12-27 DIAGNOSIS — R55 Syncope and collapse: Secondary | ICD-10-CM | POA: Diagnosis not present

## 2022-12-27 DIAGNOSIS — R202 Paresthesia of skin: Secondary | ICD-10-CM | POA: Diagnosis not present

## 2022-12-27 DIAGNOSIS — K439 Ventral hernia without obstruction or gangrene: Secondary | ICD-10-CM | POA: Diagnosis not present

## 2022-12-27 DIAGNOSIS — D6959 Other secondary thrombocytopenia: Secondary | ICD-10-CM | POA: Diagnosis not present

## 2022-12-27 DIAGNOSIS — I639 Cerebral infarction, unspecified: Secondary | ICD-10-CM | POA: Diagnosis not present

## 2022-12-27 DIAGNOSIS — I459 Conduction disorder, unspecified: Secondary | ICD-10-CM | POA: Diagnosis not present

## 2022-12-27 DIAGNOSIS — E1142 Type 2 diabetes mellitus with diabetic polyneuropathy: Secondary | ICD-10-CM | POA: Diagnosis not present

## 2022-12-27 DIAGNOSIS — Z01818 Encounter for other preprocedural examination: Secondary | ICD-10-CM | POA: Diagnosis not present

## 2022-12-27 DIAGNOSIS — Z91030 Bee allergy status: Secondary | ICD-10-CM

## 2022-12-27 DIAGNOSIS — Z7984 Long term (current) use of oral hypoglycemic drugs: Secondary | ICD-10-CM

## 2022-12-27 DIAGNOSIS — I442 Atrioventricular block, complete: Secondary | ICD-10-CM | POA: Diagnosis not present

## 2022-12-27 DIAGNOSIS — D72829 Elevated white blood cell count, unspecified: Secondary | ICD-10-CM | POA: Diagnosis not present

## 2022-12-27 DIAGNOSIS — J90 Pleural effusion, not elsewhere classified: Secondary | ICD-10-CM | POA: Diagnosis not present

## 2022-12-27 DIAGNOSIS — I4891 Unspecified atrial fibrillation: Secondary | ICD-10-CM | POA: Diagnosis not present

## 2022-12-27 DIAGNOSIS — I2583 Coronary atherosclerosis due to lipid rich plaque: Secondary | ICD-10-CM | POA: Diagnosis not present

## 2022-12-27 DIAGNOSIS — Z79899 Other long term (current) drug therapy: Secondary | ICD-10-CM

## 2022-12-27 DIAGNOSIS — I2510 Atherosclerotic heart disease of native coronary artery without angina pectoris: Secondary | ICD-10-CM | POA: Diagnosis not present

## 2022-12-27 DIAGNOSIS — Z886 Allergy status to analgesic agent status: Secondary | ICD-10-CM

## 2022-12-27 DIAGNOSIS — I6521 Occlusion and stenosis of right carotid artery: Secondary | ICD-10-CM | POA: Diagnosis present

## 2022-12-27 DIAGNOSIS — R29700 NIHSS score 0: Secondary | ICD-10-CM | POA: Diagnosis not present

## 2022-12-27 DIAGNOSIS — R0789 Other chest pain: Secondary | ICD-10-CM | POA: Diagnosis not present

## 2022-12-27 DIAGNOSIS — E1165 Type 2 diabetes mellitus with hyperglycemia: Secondary | ICD-10-CM | POA: Diagnosis present

## 2022-12-27 DIAGNOSIS — E119 Type 2 diabetes mellitus without complications: Secondary | ICD-10-CM

## 2022-12-27 DIAGNOSIS — I6340 Cerebral infarction due to embolism of unspecified cerebral artery: Secondary | ICD-10-CM | POA: Diagnosis not present

## 2022-12-27 DIAGNOSIS — I10 Essential (primary) hypertension: Secondary | ICD-10-CM | POA: Diagnosis present

## 2022-12-27 DIAGNOSIS — Z1152 Encounter for screening for COVID-19: Secondary | ICD-10-CM

## 2022-12-27 DIAGNOSIS — E1169 Type 2 diabetes mellitus with other specified complication: Secondary | ICD-10-CM | POA: Diagnosis present

## 2022-12-27 DIAGNOSIS — F32A Depression, unspecified: Secondary | ICD-10-CM | POA: Diagnosis not present

## 2022-12-27 DIAGNOSIS — E871 Hypo-osmolality and hyponatremia: Secondary | ICD-10-CM | POA: Diagnosis not present

## 2022-12-27 DIAGNOSIS — I700 Atherosclerosis of aorta: Secondary | ICD-10-CM | POA: Diagnosis not present

## 2022-12-27 DIAGNOSIS — I4892 Unspecified atrial flutter: Secondary | ICD-10-CM | POA: Diagnosis not present

## 2022-12-27 DIAGNOSIS — Z951 Presence of aortocoronary bypass graft: Secondary | ICD-10-CM | POA: Diagnosis not present

## 2022-12-27 DIAGNOSIS — J45909 Unspecified asthma, uncomplicated: Secondary | ICD-10-CM | POA: Diagnosis not present

## 2022-12-27 DIAGNOSIS — Z5986 Financial insecurity: Secondary | ICD-10-CM

## 2022-12-27 DIAGNOSIS — E1140 Type 2 diabetes mellitus with diabetic neuropathy, unspecified: Secondary | ICD-10-CM | POA: Diagnosis not present

## 2022-12-27 DIAGNOSIS — I70202 Unspecified atherosclerosis of native arteries of extremities, left leg: Secondary | ICD-10-CM | POA: Diagnosis not present

## 2022-12-27 DIAGNOSIS — E861 Hypovolemia: Secondary | ICD-10-CM | POA: Diagnosis not present

## 2022-12-27 DIAGNOSIS — N200 Calculus of kidney: Secondary | ICD-10-CM | POA: Diagnosis not present

## 2022-12-27 DIAGNOSIS — Z66 Do not resuscitate: Secondary | ICD-10-CM | POA: Diagnosis not present

## 2022-12-27 DIAGNOSIS — E039 Hypothyroidism, unspecified: Secondary | ICD-10-CM | POA: Diagnosis not present

## 2022-12-27 DIAGNOSIS — E785 Hyperlipidemia, unspecified: Secondary | ICD-10-CM | POA: Diagnosis not present

## 2022-12-27 DIAGNOSIS — Z8249 Family history of ischemic heart disease and other diseases of the circulatory system: Secondary | ICD-10-CM

## 2022-12-27 DIAGNOSIS — I214 Non-ST elevation (NSTEMI) myocardial infarction: Secondary | ICD-10-CM | POA: Diagnosis not present

## 2022-12-27 DIAGNOSIS — I358 Other nonrheumatic aortic valve disorders: Secondary | ICD-10-CM | POA: Diagnosis not present

## 2022-12-27 DIAGNOSIS — R0689 Other abnormalities of breathing: Secondary | ICD-10-CM | POA: Diagnosis not present

## 2022-12-27 DIAGNOSIS — I471 Supraventricular tachycardia, unspecified: Secondary | ICD-10-CM | POA: Diagnosis not present

## 2022-12-27 DIAGNOSIS — I959 Hypotension, unspecified: Secondary | ICD-10-CM | POA: Diagnosis not present

## 2022-12-27 DIAGNOSIS — R110 Nausea: Secondary | ICD-10-CM | POA: Diagnosis not present

## 2022-12-27 DIAGNOSIS — Z90710 Acquired absence of both cervix and uterus: Secondary | ICD-10-CM

## 2022-12-27 DIAGNOSIS — J9811 Atelectasis: Secondary | ICD-10-CM | POA: Diagnosis not present

## 2022-12-27 DIAGNOSIS — Z452 Encounter for adjustment and management of vascular access device: Secondary | ICD-10-CM | POA: Diagnosis not present

## 2022-12-27 DIAGNOSIS — I6523 Occlusion and stenosis of bilateral carotid arteries: Secondary | ICD-10-CM | POA: Diagnosis not present

## 2022-12-27 DIAGNOSIS — J984 Other disorders of lung: Secondary | ICD-10-CM | POA: Diagnosis not present

## 2022-12-27 DIAGNOSIS — R079 Chest pain, unspecified: Secondary | ICD-10-CM | POA: Diagnosis not present

## 2022-12-27 DIAGNOSIS — I25110 Atherosclerotic heart disease of native coronary artery with unstable angina pectoris: Secondary | ICD-10-CM | POA: Diagnosis not present

## 2022-12-27 DIAGNOSIS — F40240 Claustrophobia: Secondary | ICD-10-CM | POA: Diagnosis present

## 2022-12-27 DIAGNOSIS — E7800 Pure hypercholesterolemia, unspecified: Secondary | ICD-10-CM | POA: Diagnosis present

## 2022-12-27 DIAGNOSIS — N179 Acute kidney failure, unspecified: Secondary | ICD-10-CM

## 2022-12-27 DIAGNOSIS — Z4682 Encounter for fitting and adjustment of non-vascular catheter: Secondary | ICD-10-CM | POA: Diagnosis not present

## 2022-12-27 DIAGNOSIS — Z5941 Food insecurity: Secondary | ICD-10-CM

## 2022-12-27 DIAGNOSIS — Z90722 Acquired absence of ovaries, bilateral: Secondary | ICD-10-CM

## 2022-12-27 DIAGNOSIS — Z885 Allergy status to narcotic agent status: Secondary | ICD-10-CM

## 2022-12-27 DIAGNOSIS — I200 Unstable angina: Secondary | ICD-10-CM | POA: Diagnosis not present

## 2022-12-27 DIAGNOSIS — E669 Obesity, unspecified: Secondary | ICD-10-CM | POA: Diagnosis present

## 2022-12-27 DIAGNOSIS — R001 Bradycardia, unspecified: Secondary | ICD-10-CM | POA: Diagnosis not present

## 2022-12-27 DIAGNOSIS — R918 Other nonspecific abnormal finding of lung field: Secondary | ICD-10-CM | POA: Diagnosis not present

## 2022-12-27 DIAGNOSIS — Z86010 Personal history of colonic polyps: Secondary | ICD-10-CM

## 2022-12-27 DIAGNOSIS — Z01810 Encounter for preprocedural cardiovascular examination: Secondary | ICD-10-CM | POA: Diagnosis not present

## 2022-12-27 DIAGNOSIS — Z888 Allergy status to other drugs, medicaments and biological substances status: Secondary | ICD-10-CM

## 2022-12-27 DIAGNOSIS — K760 Fatty (change of) liver, not elsewhere classified: Secondary | ICD-10-CM | POA: Diagnosis not present

## 2022-12-27 DIAGNOSIS — R531 Weakness: Secondary | ICD-10-CM | POA: Diagnosis not present

## 2022-12-27 DIAGNOSIS — I252 Old myocardial infarction: Secondary | ICD-10-CM

## 2022-12-27 DIAGNOSIS — D62 Acute posthemorrhagic anemia: Secondary | ICD-10-CM | POA: Diagnosis not present

## 2022-12-27 DIAGNOSIS — Z743 Need for continuous supervision: Secondary | ICD-10-CM | POA: Diagnosis not present

## 2022-12-27 DIAGNOSIS — J9589 Other postprocedural complications and disorders of respiratory system, not elsewhere classified: Secondary | ICD-10-CM | POA: Diagnosis not present

## 2022-12-27 DIAGNOSIS — R61 Generalized hyperhidrosis: Secondary | ICD-10-CM | POA: Diagnosis not present

## 2022-12-27 DIAGNOSIS — R1084 Generalized abdominal pain: Secondary | ICD-10-CM | POA: Diagnosis not present

## 2022-12-27 DIAGNOSIS — M25512 Pain in left shoulder: Secondary | ICD-10-CM | POA: Diagnosis not present

## 2022-12-27 DIAGNOSIS — Z48812 Encounter for surgical aftercare following surgery on the circulatory system: Secondary | ICD-10-CM | POA: Diagnosis not present

## 2022-12-27 LAB — I-STAT CHEM 8, ED
BUN: 25 mg/dL — ABNORMAL HIGH (ref 8–23)
Calcium, Ion: 1.14 mmol/L — ABNORMAL LOW (ref 1.15–1.40)
Chloride: 110 mmol/L (ref 98–111)
Creatinine, Ser: 1.2 mg/dL — ABNORMAL HIGH (ref 0.44–1.00)
Glucose, Bld: 296 mg/dL — ABNORMAL HIGH (ref 70–99)
HCT: 34 % — ABNORMAL LOW (ref 36.0–46.0)
Hemoglobin: 11.6 g/dL — ABNORMAL LOW (ref 12.0–15.0)
Potassium: 3.9 mmol/L (ref 3.5–5.1)
Sodium: 138 mmol/L (ref 135–145)
TCO2: 16 mmol/L — ABNORMAL LOW (ref 22–32)

## 2022-12-27 LAB — BRAIN NATRIURETIC PEPTIDE: B Natriuretic Peptide: 46.8 pg/mL (ref 0.0–100.0)

## 2022-12-27 LAB — CBC WITH DIFFERENTIAL/PLATELET
Abs Immature Granulocytes: 0.24 10*3/uL — ABNORMAL HIGH (ref 0.00–0.07)
Basophils Absolute: 0.1 10*3/uL (ref 0.0–0.1)
Basophils Relative: 1 %
Eosinophils Absolute: 0.1 10*3/uL (ref 0.0–0.5)
Eosinophils Relative: 1 %
HCT: 36.4 % (ref 36.0–46.0)
Hemoglobin: 11.8 g/dL — ABNORMAL LOW (ref 12.0–15.0)
Immature Granulocytes: 2 %
Lymphocytes Relative: 20 %
Lymphs Abs: 2 10*3/uL (ref 0.7–4.0)
MCH: 30.2 pg (ref 26.0–34.0)
MCHC: 32.4 g/dL (ref 30.0–36.0)
MCV: 93.1 fL (ref 80.0–100.0)
Monocytes Absolute: 0.6 10*3/uL (ref 0.1–1.0)
Monocytes Relative: 6 %
Neutro Abs: 7.4 10*3/uL (ref 1.7–7.7)
Neutrophils Relative %: 70 %
Platelets: 204 10*3/uL (ref 150–400)
RBC: 3.91 MIL/uL (ref 3.87–5.11)
RDW: 13.6 % (ref 11.5–15.5)
WBC: 10.5 10*3/uL (ref 4.0–10.5)
nRBC: 0 % (ref 0.0–0.2)

## 2022-12-27 LAB — BASIC METABOLIC PANEL
Anion gap: 10 (ref 5–15)
BUN: 27 mg/dL — ABNORMAL HIGH (ref 8–23)
CO2: 17 mmol/L — ABNORMAL LOW (ref 22–32)
Calcium: 8.8 mg/dL — ABNORMAL LOW (ref 8.9–10.3)
Chloride: 108 mmol/L (ref 98–111)
Creatinine, Ser: 1.19 mg/dL — ABNORMAL HIGH (ref 0.44–1.00)
GFR, Estimated: 52 mL/min — ABNORMAL LOW (ref >60)
Glucose, Bld: 294 mg/dL — ABNORMAL HIGH (ref 70–99)
Potassium: 3.8 mmol/L (ref 3.5–5.1)
Sodium: 135 mmol/L (ref 135–145)

## 2022-12-27 LAB — D-DIMER, QUANTITATIVE: D-Dimer, Quant: <0.27 ug/mL-FEU (ref 0.00–0.50)

## 2022-12-27 LAB — SARS CORONAVIRUS 2 BY RT PCR: SARS Coronavirus 2 by RT PCR: NEGATIVE

## 2022-12-27 LAB — TROPONIN I (HIGH SENSITIVITY)
Troponin I (High Sensitivity): 38 ng/L — ABNORMAL HIGH (ref <18)
Troponin I (High Sensitivity): 34 ng/L — ABNORMAL HIGH (ref <18)

## 2022-12-27 LAB — CBG MONITORING, ED: Glucose-Capillary: 233 mg/dL — ABNORMAL HIGH (ref 70–99)

## 2022-12-27 MED ORDER — ACETAMINOPHEN 325 MG PO TABS
650.0000 mg | ORAL_TABLET | Freq: Four times a day (QID) | ORAL | Status: DC | PRN
  Administered 2022-12-30 – 2022-12-31 (×3): 650 mg via ORAL
  Filled 2022-12-27 (×4): qty 2

## 2022-12-27 MED ORDER — ALBUTEROL SULFATE HFA 108 (90 BASE) MCG/ACT IN AERS: 2.0000 | INHALATION_SPRAY | RESPIRATORY_TRACT | Status: DC | PRN

## 2022-12-27 MED ORDER — ALBUTEROL SULFATE (2.5 MG/3ML) 0.083% IN NEBU: 2.5000 mg | INHALATION_SOLUTION | RESPIRATORY_TRACT | Status: DC | PRN

## 2022-12-27 MED ORDER — ONDANSETRON HCL 4 MG/2ML IJ SOLN: 4.0000 mg | Freq: Four times a day (QID) | INTRAMUSCULAR | Status: DC | PRN

## 2022-12-27 MED ORDER — INSULIN ASPART 100 UNIT/ML IJ SOLN
0.0000 [IU] | Freq: Every day | INTRAMUSCULAR | Status: DC
  Administered 2022-12-27 – 2022-12-30 (×3): 2 [IU] via SUBCUTANEOUS
  Administered 2022-12-31: 3 [IU] via SUBCUTANEOUS
  Filled 2022-12-27: qty 0.05

## 2022-12-27 MED ORDER — HEPARIN SODIUM (PORCINE) 5000 UNIT/ML IJ SOLN
5000.0000 [IU] | Freq: Three times a day (TID) | INTRAMUSCULAR | Status: DC
  Administered 2022-12-27 – 2022-12-28 (×2): 5000 [IU] via SUBCUTANEOUS
  Filled 2022-12-27 (×2): qty 1

## 2022-12-27 MED ORDER — ATORVASTATIN CALCIUM 40 MG PO TABS
40.0000 mg | ORAL_TABLET | Freq: Every day | ORAL | Status: DC
  Administered 2022-12-28 – 2022-12-31 (×4): 40 mg via ORAL
  Filled 2022-12-27 (×4): qty 1

## 2022-12-27 MED ORDER — BUPROPION HCL ER (XL) 150 MG PO TB24
450.0000 mg | ORAL_TABLET | Freq: Every day | ORAL | Status: DC
  Administered 2022-12-28 – 2022-12-31 (×4): 450 mg via ORAL
  Filled 2022-12-27 (×5): qty 3

## 2022-12-27 MED ORDER — IOHEXOL 350 MG/ML SOLN
100.0000 mL | Freq: Once | INTRAVENOUS | Status: AC | PRN
  Administered 2022-12-27: 100 mL via INTRAVENOUS

## 2022-12-27 MED ORDER — LACTATED RINGERS IV SOLN: INTRAVENOUS | Status: AC

## 2022-12-27 MED ORDER — OXYCODONE HCL 5 MG PO TABS
5.0000 mg | ORAL_TABLET | ORAL | Status: DC | PRN
  Administered 2022-12-28 – 2022-12-31 (×7): 5 mg via ORAL
  Filled 2022-12-27 (×7): qty 1

## 2022-12-27 MED ORDER — ONDANSETRON HCL 4 MG PO TABS: 4.0000 mg | ORAL_TABLET | Freq: Four times a day (QID) | ORAL | Status: DC | PRN

## 2022-12-27 MED ORDER — SODIUM CHLORIDE 0.9% FLUSH
3.0000 mL | Freq: Two times a day (BID) | INTRAVENOUS | Status: DC
  Administered 2022-12-27 – 2022-12-31 (×4): 3 mL via INTRAVENOUS

## 2022-12-27 MED ORDER — INSULIN ASPART 100 UNIT/ML IJ SOLN
0.0000 [IU] | Freq: Three times a day (TID) | INTRAMUSCULAR | Status: DC
  Administered 2022-12-28: 2 [IU] via SUBCUTANEOUS
  Administered 2022-12-28: 1 [IU] via SUBCUTANEOUS
  Administered 2022-12-28 – 2022-12-29 (×2): 2 [IU] via SUBCUTANEOUS
  Administered 2022-12-29: 3 [IU] via SUBCUTANEOUS
  Administered 2022-12-29: 2 [IU] via SUBCUTANEOUS
  Administered 2022-12-30: 3 [IU] via SUBCUTANEOUS
  Administered 2022-12-30: 5 [IU] via SUBCUTANEOUS
  Administered 2022-12-31 (×2): 3 [IU] via SUBCUTANEOUS
  Administered 2022-12-31: 2 [IU] via SUBCUTANEOUS
  Administered 2023-01-01: 5 [IU] via SUBCUTANEOUS
  Filled 2022-12-27: qty 0.09

## 2022-12-27 MED ORDER — SODIUM CHLORIDE 0.9 % IV BOLUS
1000.0000 mL | Freq: Once | INTRAVENOUS | Status: AC
  Administered 2022-12-27: 1000 mL via INTRAVENOUS

## 2022-12-27 MED ORDER — SENNOSIDES-DOCUSATE SODIUM 8.6-50 MG PO TABS
1.0000 | ORAL_TABLET | Freq: Every evening | ORAL | Status: DC | PRN
  Filled 2022-12-27: qty 1

## 2022-12-27 MED ORDER — TOPIRAMATE 25 MG PO TABS
50.0000 mg | ORAL_TABLET | Freq: Two times a day (BID) | ORAL | Status: DC
  Administered 2022-12-27 – 2022-12-31 (×9): 50 mg via ORAL
  Filled 2022-12-27 (×11): qty 2

## 2022-12-27 MED ORDER — ACETAMINOPHEN 650 MG RE SUPP: 650.0000 mg | Freq: Four times a day (QID) | RECTAL | Status: DC | PRN

## 2022-12-27 NOTE — Hospital Course (Signed)
[name] is a 62 y.o. female with medical history significant for T2DM, HTN, HLD, depression/anxiety who is admitted for chest pain evaluation.

## 2022-12-27 NOTE — H&P (Signed)
History and Physical    SHANIK BROOKSHIRE QMV:[id number] DOB: 02-22-1961 DOA: 12/27/2022  PCP: Bennie Pierini, FNP  Patient coming from: Home  I have personally briefly reviewed patient's old medical records in [facility]  Chief Complaint: Chest pain, near syncope  HPI: [name] is a 62 y.o. female with medical history significant for T2DM, HTN, HLD, depression/anxiety who presented to the ED for evaluation of chest pain and near syncopal episode.  Patient states that for the last week she has been having intermittent chest pain.  This is described as pressure-like discomfort across her chest, to her back, and down her left arm.  This is worse with exertion.  She says normally walks her dogs 1.5 miles however since symptoms started she can barely walk a block before her symptoms occur.  Symptoms are associated with shortness of breath.  She has had the symptoms occasionally while at rest as well.  She was seen by [facility] cardiology 12/24/2022 who recommended outpatient stress testing and echocardiogram, not yet performed.  Today she was at a restaurant with friends when she suddenly became diaphoretic and nauseous while she was sitting down.  She thought her blood sugar was low however she became significantly lightheaded, developed chest pain, and felt as if she was going to pass out.  EMS were called and on their evaluation noted that her BP was 60/30.  She was given 1400 mL fluid,, 10 mcg of epi, and Zofran.  BP improved to 112/68 on arrival.  She denies any known personal history of heart disease.  She says both of her parents died of heart disease in their 70s.  She states that she has never used tobacco.  Of note, patient reports a severe allergy to aspirin described as throat closing anaphylaxis.  ED Course  Labs/Imaging on admission: I have personally reviewed following labs and imaging studies.  Initial vitals showed BP 112/68, pulse 86, RR 18, temp 98.0  F, SpO2 98% on room air.  Labs show troponin 38 > 34, BNP 46.8, WBC 10.5, hemoglobin 11.8, platelets 204,000, sodium 135, potassium 3.8, bicarb 17, BUN 27, creatinine 1.19, serum glucose 294, WBC 10.5, hemoglobin 11.8, platelets 204,000.  Portable chest x-ray negative for focal consolidation, edema, effusion.  CTA dissection study was negative for evidence of dissection.  Coronary artery disease, atherosclerotic plaques and calcifications in thoracic aorta and abdominal aorta noted.  Mild narrowing of lumen in infrarenal aorta and moderate narrowing of proximal course of left subclavian artery noted.  No evidence of PE.  No focal consolidation.  No evidence of intestinal obstruction, pneumoperitoneum, hydronephrosis.  Fatty liver and left renal calculus noted.  Patient was given 1 L normal saline.  EDP discussed case with on-call cardiology, Dr. Bjorn Pippin, who recommended medical admission to [facility] for ischemic workup.  The hospitalist service was consulted to admit for further evaluation and management.  Review of Systems: All systems reviewed and are negative except as documented in history of present illness above.   Past Medical History:  Diagnosis Date   Abdominal discomfort    Chronic abdominal and pelvic pain resulting in significant loss of time from work   Anxiety    with depression   Asthma    Chest pain    Depression    Diabetes mellitus    Drug overdose 06/12/2007   60 Naprosyn, psychiatric admission   Hyperlipidemia    Severe   Hypertension    Migraines    Tremor of both  hands    on propranolol    Past Surgical History:  Procedure Laterality Date   ABDOMINAL HYSTERECTOMY  02/10/2007   + lysis of adhesions   APPENDECTOMY  1990s   ruptured, late 90s   BILATERAL OOPHORECTOMY      in 2 surgeries prior to 9/08   CARPAL TUNNEL RELEASE Right 08/16/2021   Procedure: RIGHT CARPAL TUNNEL RELEASE;  Surgeon: Marlyne Beards, MD;  Location: [facility];  Service: Orthopedics;  Laterality: Right;   COLONOSCOPY  02/09/2010    normal upper endoscopy, single colonic polyp,   HERNIA REPAIR     VENTRAL HERNIA REPAIR     x3    Social History:  reports that she has never smoked. She has never used smokeless tobacco. She reports that she does not drink alcohol and does not use drugs.  Allergies  Allergen Reactions   Aspirin Anaphylaxis   Bee Venom Anaphylaxis   Benadryl [Diphenhydramine Hcl] Anaphylaxis   Morphine And Codeine Shortness Of Breath and Itching   Vicks Formula 44 Cough-Cold Pm [Dm-Apap-Cpm] Shortness Of Breath and Rash    Not anaphylaxis    History reviewed. No pertinent family history.   Prior to Admission medications   Medication Sig Start Date End Date Taking? Authorizing Provider  albuterol (VENTOLIN HFA) 108 (90 Base) MCG/ACT inhaler Inhale 2 puffs into the lungs every 4 (four) hours as needed for wheezing or shortness of breath. (NEEDS TO BE SEEN) 03/30/20   Dettinger, Elige Radon, MD  benazepril (LOTENSIN) 20 MG tablet Take 1 tablet (20 mg total) by mouth daily. 11/26/22   Daphine Deutscher, Mary-Margaret, FNP  buPROPion (WELLBUTRIN XL) 150 MG 24 hr tablet TAKE 3 TABLETS BY MOUTH DAILY. 12/10/22   Daphine Deutscher, Mary-Margaret, FNP  EPINEPHrine 0.3 mg/0.3 mL IJ SOAJ injection Inject 0.3 mg into the muscle as needed for anaphylaxis. 11/22/20   Daphine Deutscher, Mary-Margaret, FNP  fenofibrate 160 MG tablet Take 1 tablet (160 mg total) by mouth daily. 11/26/22   Daphine Deutscher, Mary-Margaret, FNP  glipiZIDE (GLUCOTROL XL) 10 MG 24 hr tablet Take 1 tablet (10 mg total) by mouth daily. 11/26/22   Daphine Deutscher, Mary-Margaret, FNP  lisinopril (ZESTRIL) 10 MG tablet Take 1 tablet (10 mg total) by mouth daily. 11/26/22   Daphine Deutscher, Mary-Margaret, FNP  meclizine (ANTIVERT) 25 MG tablet Take 1 tablet (25 mg total) by mouth 2 (two) times daily as needed for dizziness. 06/17/18   Roxy Horseman, PA-C  metFORMIN (GLUCOPHAGE) 500 MG tablet Take 1 tablet (500 mg total) by mouth 2  (two) times daily with a meal. 11/26/22   Daphine Deutscher, Mary-Margaret, FNP  ondansetron (ZOFRAN) 4 MG tablet Take 1 tablet (4 mg total) by mouth every 8 (eight) hours as needed for nausea or vomiting. 05/03/20   Junie Spencer, FNP  predniSONE (STERAPRED UNI-PAK 21 TAB) 10 MG (21) TBPK tablet As directed x 6 days 12/21/22   Daphine Deutscher, Mary-Margaret, FNP  propranolol (INDERAL) 40 MG tablet Take 1 tablet (40 mg total) by mouth 2 (two) times daily. (Needs to be seen before next refill) 11/26/22   Bennie Pierini, FNP  rizatriptan (MAXALT) 10 MG tablet Take 1 tablet (10 mg total) by mouth as needed for migraine. May repeat in 2 hours if needed 07/23/22   Bennie Pierini, FNP  rosuvastatin (CRESTOR) 10 MG tablet Take 1 tablet (10 mg total) by mouth daily. 11/26/22   Daphine Deutscher Mary-Margaret, FNP  RYBELSUS 7 MG TABS TAKE 1 TABLET (7 MG TOTAL) BY MOUTH DAILY 08/23/22   Daphine Deutscher, Mary-Margaret,  FNP  topiramate (TOPAMAX) 50 MG tablet Take 1 tablet (50 mg total) by mouth 2 (two) times daily. 11/26/22   Bennie Pierini, FNP    Physical Exam: Vitals:   12/27/22 1845 12/27/22 1945 12/27/22 2030 12/27/22 2054  BP: 122/63 (!) 130/112 (!) 100/56   Pulse: 83 87 79   Resp: 17 20 14    Temp:    98 F (36.7 C)  TempSrc:      SpO2: 95% 100% 97%   Weight:      Height:       Constitutional: Resting in bed, NAD, calm, comfortable Eyes: EOMI, lids and conjunctivae normal ENMT: Mucous membranes are moist. Posterior pharynx clear of any exudate or lesions.Normal dentition.  Neck: normal, supple, no masses. Respiratory: clear to auscultation bilaterally, no wheezing, no crackles. Normal respiratory effort. No accessory muscle use.  Cardiovascular: Regular rate and rhythm, soft systolic murmur. No extremity edema. 2+ pedal pulses. Abdomen: no tenderness, no masses palpated.  Musculoskeletal: no clubbing / cyanosis. No joint deformity upper and lower extremities. Good ROM, no contractures. Normal muscle tone.  Skin:  no rashes, lesions, ulcers. No induration Neurologic: Sensation intact. Strength 5/5 in all 4.  Psychiatric: Normal judgment and insight. Alert and oriented x 3.  Somewhat anxious mood.   EKG: Personally reviewed. Sinus rhythm, rate 85, no acute ischemic changes, rate is faster when compared to previous.  Assessment/Plan Principal Problem:   Chest pain Active Problems:   Hypotension   AKI (acute kidney injury) (HCC)   Type 2 diabetes mellitus (HCC)   Hyperlipidemia associated with type 2 diabetes mellitus (HCC)   MIYANNA WIERSMA is a 62 y.o. female with medical history significant for T2DM, HTN, HLD, depression/anxiety who is admitted for chest pain evaluation.  Assessment and Plan: Chest pain: Patient with concerning chest pain features ongoing for the last week.  Mild troponin elevation 38 >>34 noted.  EKG without acute ischemic changes.  CTA notable for coronary artery and aortic calcifications.  No dissection or PE seen.  EDP discussed with cardiology who recommended admission to [facility] for further ischemic evaluation. -Admit to [facility] -Cardiology to follow -Obtain echocardiogram -Aspirin not started at this time due to patient's report of severe allergic reaction -Will keep n.p.o. after midnight  Hypotension with history of hypertension: BP 60/30 in the field, improved with IV fluids. -Holding home antihypertensives -Of note patient has both lisinopril and benazepril listed on her home meds -Continue IV fluid hydration overnight  Acute kidney injury: Mild likely due to hypoperfusion from hypotensive episode.  -Holding metformin and ACE inhibitor -Continue IV fluid hydration overnight  Type 2 diabetes: Hgb A1c 9.1% 11/26/2022.  Holding home meds and placed on SSI.  Hyperlipidemia: Continue atorvastatin.  Depression/anxiety: Continue Wellbutrin.   DVT prophylaxis: heparin injection 5,000 Units Start: 12/27/22 2200 Code Status: DNR,  confirmed with patient on admission. Family Communication: Patient has discussed with close acquaintances, she states she does not have any family. Disposition Plan: From home and likely discharge to home pending clinical progress Consults called: Cardiology Severity of Illness: The appropriate patient status for this patient is INPATIENT. Inpatient status is judged to be reasonable and necessary in order to provide the required intensity of service to ensure the patient's safety. The patient's presenting symptoms, physical exam findings, and initial radiographic and laboratory data in the context of their chronic comorbidities is felt to place them at high risk for further clinical deterioration. Furthermore, it is not anticipated that the patient  will be medically stable for discharge from the hospital within 2 midnights of admission.   * I certify that at the point of admission it is my clinical judgment that the patient will require inpatient hospital care spanning beyond 2 midnights from the point of admission due to high intensity of service, high risk for further deterioration and high frequency of surveillance required.Darreld Mclean MD [facility]  If 7PM-7AM, please contact night-coverage [email or web address]  12/27/2022, 9:47 PM

## 2022-12-27 NOTE — ED Notes (Signed)
Pt ambulatory without staff assist, maintains steady and equal gait.

## 2022-12-27 NOTE — ED Triage Notes (Signed)
Patient BIB EMS for hypotension and chest pain. Patient reports she was eating lunch when she had sudden onset of abdominal pain. Patient became nauseous, diaphoretic, lightheaded, and had chest pain. On EMS arrival bp was 60/30. Patient received 1400 ml NS, 10 mcg of epi and 4 mg zofran with EMS. On arrival patient continues to have chest pain, but other symptoms have resolved. Patient's BP on arrival was 112/68.

## 2022-12-27 NOTE — ED Provider Notes (Signed)
[name] Provider Note   CSN: [id number] Arrival date & time: 12/27/22  1553     History  Chief Complaint  Patient presents with   Hypotension    [name] is a 62 y.o. female presented to ED with an episode of near syncope and hypotension.  Patient reports he has been feeling very weak, nauseated, with chest pain all week.  She says she was at [facility] 1 week ago in the ER for this, was told she needs an outpatient echocardiogram, but has not been able to schedule yet.  She has never had a history of MI or coronary disease or seen a cardiologist.  She reports that today she was having lunch she began to feel extremely weak, nauseated.  She is diabetic and thought her sugar might be low and tried to eat some food, but did not help her symptoms.  EMS reports her blood pressure was 60/30 initially on scene, the patient was pale and diaphoretic.  Her glucose was >200 on scene.  Patient was given 1400 cc of normal saline fluid by EMS, as well as "two push doses of epinephrine" for hypotension, with reported improvement of the patient's blood pressure and coloration.  Patient ports her typical blood pressure is around 120/80, normal.  She also reports she does have a history of syncope in the past, approximately for the past 6 or 7 months, and that this can happen about once a month.  It is not necessarily associated with exertion.  No black tarry stools.  Not on A/C.  Since arriving in the ER the patient reports she feels significantly better, only mild lightheadedness.  BP 112/68 on arrival.  She reports that for the past several days she is having pain between her shoulder blades, pain down her left arm, with any type of activity as well as at rest.  She was having lower abdominal pain as well today.  HPI     Home Medications Prior to Admission medications   Medication Sig Start Date End Date Taking? Authorizing Provider   albuterol (VENTOLIN HFA) 108 (90 Base) MCG/ACT inhaler Inhale 2 puffs into the lungs every 4 (four) hours as needed for wheezing or shortness of breath. (NEEDS TO BE SEEN) 03/30/20   Dettinger, Elige Radon, MD  benazepril (LOTENSIN) 20 MG tablet Take 1 tablet (20 mg total) by mouth daily. 11/26/22   Daphine Deutscher, Mary-Margaret, FNP  buPROPion (WELLBUTRIN XL) 150 MG 24 hr tablet TAKE 3 TABLETS BY MOUTH DAILY. 12/10/22   Daphine Deutscher, Mary-Margaret, FNP  EPINEPHrine 0.3 mg/0.3 mL IJ SOAJ injection Inject 0.3 mg into the muscle as needed for anaphylaxis. 11/22/20   Daphine Deutscher, Mary-Margaret, FNP  fenofibrate 160 MG tablet Take 1 tablet (160 mg total) by mouth daily. 11/26/22   Daphine Deutscher, Mary-Margaret, FNP  glipiZIDE (GLUCOTROL XL) 10 MG 24 hr tablet Take 1 tablet (10 mg total) by mouth daily. 11/26/22   Daphine Deutscher, Mary-Margaret, FNP  lisinopril (ZESTRIL) 10 MG tablet Take 1 tablet (10 mg total) by mouth daily. 11/26/22   Daphine Deutscher, Mary-Margaret, FNP  meclizine (ANTIVERT) 25 MG tablet Take 1 tablet (25 mg total) by mouth 2 (two) times daily as needed for dizziness. 06/17/18   Roxy Horseman, PA-C  metFORMIN (GLUCOPHAGE) 500 MG tablet Take 1 tablet (500 mg total) by mouth 2 (two) times daily with a meal. 11/26/22   Daphine Deutscher, Mary-Margaret, FNP  ondansetron (ZOFRAN) 4 MG tablet Take 1 tablet (4 mg total) by mouth every  8 (eight) hours as needed for nausea or vomiting. 05/03/20   Junie Spencer, FNP  predniSONE (STERAPRED UNI-PAK 21 TAB) 10 MG (21) TBPK tablet As directed x 6 days 12/21/22   Daphine Deutscher, Mary-Margaret, FNP  propranolol (INDERAL) 40 MG tablet Take 1 tablet (40 mg total) by mouth 2 (two) times daily. (Needs to be seen before next refill) 11/26/22   Bennie Pierini, FNP  rizatriptan (MAXALT) 10 MG tablet Take 1 tablet (10 mg total) by mouth as needed for migraine. May repeat in 2 hours if needed 07/23/22   Bennie Pierini, FNP  rosuvastatin (CRESTOR) 10 MG tablet Take 1 tablet (10 mg total) by mouth daily. 11/26/22   Daphine Deutscher,  Mary-Margaret, FNP  RYBELSUS 7 MG TABS TAKE 1 TABLET (7 MG TOTAL) BY MOUTH DAILY 08/23/22   Daphine Deutscher, Mary-Margaret, FNP  topiramate (TOPAMAX) 50 MG tablet Take 1 tablet (50 mg total) by mouth 2 (two) times daily. 11/26/22   Bennie Pierini, FNP      Allergies    Aspirin, Bee venom, Benadryl [diphenhydramine hcl], Morphine and codeine, and Vicks formula 44 cough-cold pm [dm-apap-cpm]    Review of Systems   Review of Systems  Physical Exam Updated Vital Signs BP (!) 100/56   Pulse 79   Temp 98 F (36.7 C) (Oral)   Resp 14   Ht 5\' 4"  (1.626 m)   Wt 91.2 kg   SpO2 97%   BMI 34.50 kg/m  Physical Exam Constitutional:      General: She is not in acute distress. HENT:     Head: Normocephalic and atraumatic.  Eyes:     Conjunctiva/sclera: Conjunctivae normal.     Pupils: Pupils are equal, round, and reactive to light.  Cardiovascular:     Rate and Rhythm: Normal rate and regular rhythm.  Pulmonary:     Effort: Pulmonary effort is normal. No respiratory distress.  Abdominal:     General: There is no distension.     Tenderness: There is no abdominal tenderness.  Skin:    General: Skin is warm and dry.  Neurological:     General: No focal deficit present.     Mental Status: She is alert and oriented to person, place, and time. Mental status is at baseline.  Psychiatric:        Mood and Affect: Mood normal.        Behavior: Behavior normal.     ED Results / Procedures / Treatments   Labs (all labs ordered are listed, but only abnormal results are displayed) Labs Reviewed  BASIC METABOLIC PANEL - Abnormal; Notable for the following components:      Result Value   CO2 17 (*)    Glucose, Bld 294 (*)    BUN 27 (*)    Creatinine, Ser 1.19 (*)    Calcium 8.8 (*)    GFR, Estimated 52 (*)    All other components within normal limits  CBC WITH DIFFERENTIAL/PLATELET - Abnormal; Notable for the following components:   Hemoglobin 11.8 (*)    Abs Immature Granulocytes 0.24  (*)    All other components within normal limits  I-STAT CHEM 8, ED - Abnormal; Notable for the following components:   BUN 25 (*)    Creatinine, Ser 1.20 (*)    Glucose, Bld 296 (*)    Calcium, Ion 1.14 (*)    TCO2 16 (*)    Hemoglobin 11.6 (*)    HCT 34.0 (*)    All other components within normal  limits  TROPONIN I (HIGH SENSITIVITY) - Abnormal; Notable for the following components:   Troponin I (High Sensitivity) 38 (*)    All other components within normal limits  TROPONIN I (HIGH SENSITIVITY) - Abnormal; Notable for the following components:   Troponin I (High Sensitivity) 34 (*)    All other components within normal limits  SARS CORONAVIRUS 2 BY RT PCR  BRAIN NATRIURETIC PEPTIDE  D-DIMER, QUANTITATIVE    EKG EKG Interpretation Date/Time:  Thursday December 27 2022 16:05:54 EDT Ventricular Rate:  85 PR Interval:  159 QRS Duration:  76 QT Interval:  386 QTC Calculation: 459 R Axis:   62  Text Interpretation: Sinus rhythm Low voltage, precordial leads Confirmed by Alvester Chou [phone]) on 12/27/2022 4:24:43 PM  Radiology CT Angio Chest/Abd/Pel for Dissection W and/or Wo Contrast  Result Date: 12/27/2022 CLINICAL DATA:  Acute aortic syndrome suspected, chest pain EXAM: CT ANGIOGRAPHY CHEST, ABDOMEN AND PELVIS TECHNIQUE: Non-contrast CT of the chest was initially obtained. Multidetector CT imaging through the chest, abdomen and pelvis was performed using the standard protocol during bolus administration of intravenous contrast. Multiplanar reconstructed images and MIPs were obtained and reviewed to evaluate the vascular anatomy. RADIATION DOSE REDUCTION: This exam was performed according to the departmental dose-optimization program which includes automated exposure control, adjustment of the mA and/or kV according to patient size and/or use of iterative reconstruction technique. CONTRAST:  OMNIPAQUE IOHEXOL 350 MG/ML SOLN COMPARISON:  04/25/2019 FINDINGS: CTA CHEST FINDINGS  Cardiovascular: There is no demonstrable mural hematoma in thoracic aorta in noncontrast images. Coronary artery calcifications are seen. There are scattered calcifications in thoracic aorta. There is homogeneous enhancement in thoracic aorta. There is no demonstrable intimal flap. Major branches of the thoracic aorta appear patent. Atherosclerotic plaques are noted in the proximal left subclavian artery with moderate narrowing of lumen. There are no intraluminal filling defects in pulmonary artery branches. Mediastinum/Nodes: No acute findings are seen. Lungs/Pleura: There is no focal pulmonary consolidation. There is no pleural effusion or pneumothorax. Musculoskeletal: No acute findings are seen. Review of the MIP images confirms the above findings. CTA ABDOMEN AND PELVIS FINDINGS VASCULAR Aorta: Atherosclerotic plaques and calcifications are seen. There is no demonstrable edema or flap. There is mild-to-moderate narrowing of lumen in infrarenal aorta. Celiac: Unremarkable. SMA: Unremarkable. Renals: There are 2 right renal arteries. There are scattered atherosclerotic plaques without high-grade stenosis. IMA: Patent. Inflow: Unremarkable. Veins: Unremarkable. Review of the MIP images confirms the above findings. NON-VASCULAR Hepatobiliary: There is fatty infiltration in liver. There is no dilation of bile ducts. Gallbladder is unremarkable. Pancreas: No focal abnormalities are seen. Spleen: Unremarkable. Adrenals/Urinary Tract: Adrenals are unremarkable. There is no hydronephrosis. There is 3 mm left renal calculus. Ureters are not dilated. Urinary bladder is not distended. Stomach/Bowel: Stomach is moderately distended. Small bowel loops are unremarkable. Appendix is not seen. There is no pericecal inflammation. There is no significant wall thickening in colon. Lymphatic: Unremarkable. Reproductive: Uterus is not seen.  Cervix is unremarkable. Other: There is no ascites or pneumoperitoneum. There is previous  ventral hernia repair. Musculoskeletal: No acute findings are seen. Review of the MIP images confirms the above findings. IMPRESSION: There is no evidence of dissection in thoracic aorta and abdominal aorta. Major branches of thoracic and abdominal aorta are patent. Coronary artery disease. Atherosclerotic plaques and calcifications are seen in thoracic aorta and abdominal aorta. There is mild narrowing of the lumen in infrarenal aorta. There is moderate narrowing of proximal course of the left subclavian artery.  There is no evidence of pulmonary artery embolism. There is no focal pulmonary consolidation. There is no evidence of intestinal obstruction or pneumoperitoneum. There is no hydronephrosis. Fatty liver.  Left renal calculus. Other findings as described in the body of the report. Electronically Signed   By: Ernie Avena M.D.   On: 12/27/2022 18:14   DG Chest Portable 1 View  Result Date: 12/27/2022 CLINICAL DATA:  Chest pain and near syncope. EXAM: PORTABLE CHEST 1 VIEW COMPARISON:  April 06, 2020 FINDINGS: The heart size and mediastinal contours are within normal limits. Both lungs are clear. The visualized skeletal structures are unremarkable. IMPRESSION: No active disease. Electronically Signed   By: Aram Candela M.D.   On: 12/27/2022 16:44    Procedures Procedures    Medications Ordered in ED Medications  sodium chloride 0.9 % bolus 1,000 mL (0 mLs Intravenous Stopped 12/27/22 1813)  iohexol (OMNIPAQUE) 350 MG/ML injection 100 mL (100 mLs Intravenous Contrast Given 12/27/22 1755)    ED Course/ Medical Decision Making/ A&P Clinical Course as of 12/27/22 2033  Thu Dec 27, 2022  1904 Delta troponin flat - BP has been stable - will discuss with cardiology [MT]    Clinical Course User Index [MT] Terald Sleeper, MD                             Medical Decision Making Amount and/or Complexity of Data Reviewed Labs: ordered. Radiology: ordered.  Risk Prescription  drug management. Decision regarding hospitalization.   This patient presents to the ED with concern for hypotension and syncope versus near syncope. This involves an extensive number of treatment options, and is a complaint that carries with it a high risk of complications and morbidity.  The differential diagnosis includes vasovagal episode versus aortic dissection vs arrhythmia vs ACS vs other  No hypoxia to suggest large PE  Pt afebrile on arrival with no localizing infection symptoms to suggest sepsis  Co-morbidities that complicate the patient evaluation: diabetes is CV risk factor  Additional history obtained from EMS  External records from outside source obtained and reviewed including CT PE study 08/03/22 at [facility] with no PE< lateral right upper lobe infiltrate noted at the time.  I ordered and personally interpreted labs.  The pertinent results include: Troponin mildly elevated but flat on repeat.  No other emergent findings noted  I ordered imaging studies including CT dissection study I independently visualized and interpreted imaging which showed no emergent findings Splane the patient's symptoms I agree with the radiologist interpretation  The patient was maintained on a cardiac monitor.  I personally viewed and interpreted the cardiac monitored which showed an underlying rhythm of: Sinus rhythm  Per my interpretation the patient's ECG shows sinus rhythm without acute ischemic findings  I ordered medication including IV fluid bolus for hypotension  I have reviewed the patients home medicines and have made adjustments as needed  Test Considered: Doubt acute PE clinically.  No indication for repeat CT angiogram at this time  I discussed the case with cardiologist on the phone, Dr. Bjorn Pippin, who agreed with plan for medical admission, echocardiogram, transfer to [facility] and potential ischemic testing or workup tomorrow.  Cardiology will consult on patient upon her arrival at  [facility].  No indication for heparin at this time as the patient is currently chest pain-free, with a normal EKG and flat troponins.  After the interventions noted above, I reevaluated the patient and found that  they have: stayed the same  Dispostion:  After consideration of the diagnostic results and the patients response to treatment, I feel that the patent would benefit from medical admission         Final Clinical Impression(s) / ED Diagnoses Final diagnoses:  Hypotension, unspecified hypotension type  Near syncope  Chest pain, unspecified type    Rx / DC Orders ED Discharge Orders     None         Terald Sleeper, MD 12/27/22 2033

## 2022-12-28 ENCOUNTER — Other Ambulatory Visit (HOSPITAL_COMMUNITY): Payer: 59

## 2022-12-28 ENCOUNTER — Encounter (HOSPITAL_COMMUNITY)
Admission: EM | Disposition: A | Discharge status: Home / Self Care | Payer: Self-pay | Source: Home / Self Care | Attending: Thoracic Surgery (Cardiothoracic Vascular Surgery)

## 2022-12-28 ENCOUNTER — Inpatient Hospital Stay (HOSPITAL_COMMUNITY): Payer: 59

## 2022-12-28 DIAGNOSIS — I2510 Atherosclerotic heart disease of native coronary artery without angina pectoris: Secondary | ICD-10-CM | POA: Diagnosis not present

## 2022-12-28 DIAGNOSIS — R079 Chest pain, unspecified: Secondary | ICD-10-CM | POA: Diagnosis not present

## 2022-12-28 DIAGNOSIS — I214 Non-ST elevation (NSTEMI) myocardial infarction: Secondary | ICD-10-CM | POA: Diagnosis not present

## 2022-12-28 HISTORY — PX: LEFT HEART CATH AND CORONARY ANGIOGRAPHY: CATH118249

## 2022-12-28 LAB — CBC
HCT: 32 % — ABNORMAL LOW (ref 36.0–46.0)
Hemoglobin: 10.4 g/dL — ABNORMAL LOW (ref 12.0–15.0)
MCH: 30.1 pg (ref 26.0–34.0)
MCHC: 32.5 g/dL (ref 30.0–36.0)
MCV: 92.8 fL (ref 80.0–100.0)
Platelets: 191 10*3/uL (ref 150–400)
RBC: 3.45 MIL/uL — ABNORMAL LOW (ref 3.87–5.11)
RDW: 13.8 % (ref 11.5–15.5)
WBC: 8.6 10*3/uL (ref 4.0–10.5)
nRBC: 0 % (ref 0.0–0.2)

## 2022-12-28 LAB — LIPID PANEL
Cholesterol: 261 mg/dL — ABNORMAL HIGH (ref 0–200)
HDL: 38 mg/dL — ABNORMAL LOW (ref >40)
LDL Cholesterol: UNDETERMINED mg/dL (ref 0–99)
Total CHOL/HDL Ratio: 6.9 RATIO
Triglycerides: 594 mg/dL — ABNORMAL HIGH (ref <150)
VLDL: UNDETERMINED mg/dL (ref 0–40)

## 2022-12-28 LAB — BASIC METABOLIC PANEL
Anion gap: 10 (ref 5–15)
BUN: 23 mg/dL (ref 8–23)
CO2: 16 mmol/L — ABNORMAL LOW (ref 22–32)
Calcium: 8.9 mg/dL (ref 8.9–10.3)
Chloride: 108 mmol/L (ref 98–111)
Creatinine, Ser: 0.96 mg/dL (ref 0.44–1.00)
GFR, Estimated: >60 mL/min (ref >60)
Glucose, Bld: 262 mg/dL — ABNORMAL HIGH (ref 70–99)
Potassium: 4 mmol/L (ref 3.5–5.1)
Sodium: 134 mmol/L — ABNORMAL LOW (ref 135–145)

## 2022-12-28 LAB — GLUCOSE, CAPILLARY
Glucose-Capillary: 173 mg/dL — ABNORMAL HIGH (ref 70–99)
Glucose-Capillary: 137 mg/dL — ABNORMAL HIGH (ref 70–99)
Glucose-Capillary: 197 mg/dL — ABNORMAL HIGH (ref 70–99)

## 2022-12-28 LAB — CBG MONITORING, ED: Glucose-Capillary: 217 mg/dL — ABNORMAL HIGH (ref 70–99)

## 2022-12-28 LAB — LDL CHOLESTEROL, DIRECT: Direct LDL: 136 mg/dL — ABNORMAL HIGH (ref 0–99)

## 2022-12-28 LAB — HIV ANTIBODY (ROUTINE TESTING W REFLEX): HIV Screen 4th Generation wRfx: NONREACTIVE

## 2022-12-28 SURGERY — LEFT HEART CATH AND CORONARY ANGIOGRAPHY
Anesthesia: LOCAL

## 2022-12-28 MED ORDER — SODIUM CHLORIDE 0.9 % IV SOLN: INTRAVENOUS | Status: AC

## 2022-12-28 MED ORDER — HEPARIN SODIUM (PORCINE) 1000 UNIT/ML IJ SOLN
INTRAMUSCULAR | Status: AC
  Filled 2022-12-28: qty 10

## 2022-12-28 MED ORDER — SODIUM CHLORIDE 0.9% FLUSH: 3.0000 mL | INTRAVENOUS | Status: DC | PRN

## 2022-12-28 MED ORDER — VERAPAMIL HCL 2.5 MG/ML IV SOLN
INTRAVENOUS | Status: DC | PRN
  Administered 2022-12-28: 10 mL via INTRA_ARTERIAL

## 2022-12-28 MED ORDER — LABETALOL HCL 5 MG/ML IV SOLN: 10.0000 mg | INTRAVENOUS | Status: AC | PRN

## 2022-12-28 MED ORDER — SODIUM CHLORIDE 0.9 % IV SOLN: 250.0000 mL | INTRAVENOUS | Status: DC | PRN

## 2022-12-28 MED ORDER — FENTANYL CITRATE (PF) 100 MCG/2ML IJ SOLN
INTRAMUSCULAR | Status: AC
  Filled 2022-12-28: qty 2

## 2022-12-28 MED ORDER — HYDRALAZINE HCL 20 MG/ML IJ SOLN: 10.0000 mg | INTRAMUSCULAR | Status: AC | PRN

## 2022-12-28 MED ORDER — HEPARIN (PORCINE) 25000 UT/250ML-% IV SOLN
1600.0000 [IU]/h | INTRAVENOUS | Status: DC
  Administered 2022-12-28: 1150 [IU]/h via INTRAVENOUS
  Administered 2022-12-29: 1500 [IU]/h via INTRAVENOUS
  Administered 2022-12-30: 1600 [IU]/h via INTRAVENOUS
  Filled 2022-12-28 (×3): qty 250

## 2022-12-28 MED ORDER — ONDANSETRON HCL 4 MG/2ML IJ SOLN: 4.0000 mg | Freq: Four times a day (QID) | INTRAMUSCULAR | Status: DC | PRN

## 2022-12-28 MED ORDER — HEPARIN (PORCINE) IN NACL 1000-0.9 UT/500ML-% IV SOLN
INTRAVENOUS | Status: DC | PRN
  Administered 2022-12-28 (×2): 500 mL

## 2022-12-28 MED ORDER — FENTANYL CITRATE (PF) 100 MCG/2ML IJ SOLN
INTRAMUSCULAR | Status: DC | PRN
  Administered 2022-12-28 (×2): 25 ug via INTRAVENOUS

## 2022-12-28 MED ORDER — SODIUM CHLORIDE 0.9 % WEIGHT BASED INFUSION: 1.0000 mL/kg/h | INTRAVENOUS | Status: DC

## 2022-12-28 MED ORDER — LIDOCAINE HCL (PF) 1 % IJ SOLN
INTRAMUSCULAR | Status: AC
  Filled 2022-12-28: qty 30

## 2022-12-28 MED ORDER — MIDAZOLAM HCL 2 MG/2ML IJ SOLN
INTRAMUSCULAR | Status: AC
  Filled 2022-12-28: qty 2

## 2022-12-28 MED ORDER — MIDAZOLAM HCL 2 MG/2ML IJ SOLN
INTRAMUSCULAR | Status: DC | PRN
  Administered 2022-12-28 (×2): 1 mg via INTRAVENOUS

## 2022-12-28 MED ORDER — NITROGLYCERIN 0.4 MG SL SUBL
0.4000 mg | SUBLINGUAL_TABLET | SUBLINGUAL | Status: AC | PRN
  Administered 2022-12-28 (×3): 0.4 mg via SUBLINGUAL
  Filled 2022-12-28: qty 1

## 2022-12-28 MED ORDER — VERAPAMIL HCL 2.5 MG/ML IV SOLN
INTRAVENOUS | Status: AC
  Filled 2022-12-28: qty 2

## 2022-12-28 MED ORDER — NITROGLYCERIN IN D5W 200-5 MCG/ML-% IV SOLN
2.0000 ug/min | INTRAVENOUS | Status: DC
  Administered 2022-12-28: 5 ug/min via INTRAVENOUS
  Administered 2022-12-30: 30 ug/min via INTRAVENOUS
  Administered 2022-12-31: 45 ug/min via INTRAVENOUS
  Filled 2022-12-28 (×3): qty 250

## 2022-12-28 MED ORDER — HEPARIN SODIUM (PORCINE) 1000 UNIT/ML IJ SOLN
INTRAMUSCULAR | Status: DC | PRN
  Administered 2022-12-28: 5000 [IU] via INTRAVENOUS

## 2022-12-28 MED ORDER — SODIUM CHLORIDE 0.9 % WEIGHT BASED INFUSION
3.0000 mL/kg/h | INTRAVENOUS | Status: DC
  Administered 2022-12-28: 3 mL/kg/h via INTRAVENOUS

## 2022-12-28 MED ORDER — LIDOCAINE HCL (PF) 1 % IJ SOLN
INTRAMUSCULAR | Status: DC | PRN
  Administered 2022-12-28: 5 mL via INTRADERMAL

## 2022-12-28 MED ORDER — SODIUM CHLORIDE 0.9% FLUSH
3.0000 mL | Freq: Two times a day (BID) | INTRAVENOUS | Status: DC
  Administered 2022-12-28 – 2022-12-31 (×2): 3 mL via INTRAVENOUS

## 2022-12-28 MED ORDER — IOHEXOL 350 MG/ML SOLN
INTRAVENOUS | Status: DC | PRN
  Administered 2022-12-28: 35 mL

## 2022-12-28 SURGICAL SUPPLY — 8 items
CATH INFINITI 5FR ANG PIGTAIL (CATHETERS) IMPLANT
CATH INFINITI AMBI 6FR TG (CATHETERS) IMPLANT
GLIDESHEATH SLEND SS 6F .021 (SHEATH) IMPLANT
KIT HEART LEFT (KITS) ×1 IMPLANT
PACK CARDIAC CATHETERIZATION (CUSTOM PROCEDURE TRAY) ×1 IMPLANT
TRANSDUCER W/STOPCOCK (MISCELLANEOUS) ×1 IMPLANT
TUBING CIL FLEX 10 FLL-RA (TUBING) ×1 IMPLANT
WIRE EMERALD 3MM-J .035X260CM (WIRE) IMPLANT

## 2022-12-28 NOTE — Progress Notes (Signed)
Patient received in room via [facility]. Patient stating she is having 10/10 chest pain on arrival to room. EKG obtained and 2 SL nitroglycerin given. Pain decreased to 3/10. BP stable: 121/64. Marjie Skiff PA came to bedside to evaluate patient.

## 2022-12-28 NOTE — Progress Notes (Signed)
   Upon arrival to [facility] from [facility], patient complained of 10/10 pain that radiated down her left arm. Went to bedside. Patient in no acute distress. She had already received 1 dose of sublingual Nitroglycerin and pain improved to 7/10. Repeat EKG showed no acute changes. After 2nd dose of sublingual Nitroglycerin, pain improved to 3/10 and she looked more comfortable. Discussed with Dr. Izora Ribas - we will go ahead and proceed with cardiac catheterization. Called and spoke with cath lab - she will be placed on add on board. The patient understands that risks include but are not limited to stroke (1 in 1000), death (1 in 1000), kidney failure [usually temporary] (1 in 500), bleeding (1 in 200), allergic reaction [possibly serious] (1 in 200), and agrees to proceed.   We can place her on an IV Nitroglycerin drip if needed. She did come in with hypotension but BP has now stablized and she tolerated the 2 doses of sublignual Nitroglycerin fine. Patient feels much better and would like to hold off on this for now. She is agreeable to cardiac catheterization.  The patient understands that risks include but are not limited to stroke (1 in 1000), death (1 in 1000), kidney failure [usually temporary] (1 in 500), bleeding (1 in 200), allergic reaction [possibly serious] (1 in 200), and agrees to proceed.   Will place pre-cath orders.  Corrin Parker, PA-C 12/28/2022 1:00 PM

## 2022-12-28 NOTE — Progress Notes (Signed)
PROGRESS NOTE  [name] OZH:[id number] DOB: Dec 15, 1960 DOA: 12/27/2022 PCP: Bennie Pierini, FNP   LOS: 1 day   Brief Narrative / Interim history: 62 y.o. female with medical history significant for T2DM, HTN, HLD, depression/anxiety who presented to the ED for evaluation of chest pain and near syncopal episode. Patient states that for the last week she has been having intermittent chest pain.  This is described as pressure-like discomfort across her chest, to her back, and down her left arm.  This is worse with exertion.  She says normally walks her dogs 1.5 miles however since symptoms started she can barely walk a block before her symptoms occur.  Symptoms are associated with shortness of breath.  She has had the symptoms occasionally while at rest as well. She also reports nausea. She was seen by [facility] cardiology 12/24/2022 who recommended outpatient stress testing and echocardiogram, not yet performed. On the day of admission she was at a restaurant with friends when she suddenly became diaphoretic and nauseous while she was sitting down.   Subjective / 24h Interval events: Seen in the ED, continues to experience chest discomfort, radiating to her left shoulder   Assesement and Plan: Principal Problem:   Chest pain Active Problems:   Hypotension   AKI (acute kidney injury) (HCC)   Type 2 diabetes mellitus (HCC)   Hyperlipidemia associated with type 2 diabetes mellitus (HCC)   Principal problem Chest pain, concern for UA  - Patient with concerning chest pain features ongoing for the last week.  Mild troponin elevation 38 >>34 noted.  EKG without acute ischemic changes.  CTA notable for coronary artery and aortic calcifications.  No dissection or PE seen.  EDP discussed with cardiology who recommended admission to [facility] for further ischemic evaluation. -aspirin not started due to reported severe allergy    Active problems Hypotension with history of  hypertension - BP 60/30 in the field, improved with IV fluids. Holding home antihypertensives. BO stable in the ER   Acute kidney injury - Mild likely due to hypoperfusion from hypotensive episode.  Holding metformin and ACE inhibitor -Continue IV fluid hydration   Type 2 diabetes, poorly controlled, with hyperglycemia - Holding home meds and placed on SSI.  Lab Results  Component Value Date   HGBA1C 9.1 (H) 11/26/2022   CBG (last 3)  Recent Labs    12/27/22 2152  GLUCAP 233*     Hyperlipidemia - Continue atorvastatin. Lipid panel shows poorly controled profile   Depression/anxiety - Continue Wellbutrin.  Scheduled Meds:  atorvastatin  40 mg Oral Daily   buPROPion  450 mg Oral Daily   heparin  5,000 Units Subcutaneous Q8H   insulin aspart  0-5 Units Subcutaneous QHS   insulin aspart  0-9 Units Subcutaneous TID WC   sodium chloride flush  3 mL Intravenous Q12H   topiramate  50 mg Oral BID   Continuous Infusions:  lactated ringers Stopped (12/28/22 0623)   PRN Meds:.acetaminophen **OR** acetaminophen, albuterol, ondansetron **OR** ondansetron (ZOFRAN) IV, oxyCODONE, senna-docusate  Current Outpatient Medications  Medication Instructions   albuterol (VENTOLIN HFA) 108 (90 Base) MCG/ACT inhaler 2 puffs, Inhalation, Every 4 hours PRN, (NEEDS TO BE SEEN)   atorvastatin (LIPITOR) 40 mg, Oral, Daily   benazepril (LOTENSIN) 20 mg, Oral, Daily   buPROPion (WELLBUTRIN XL) 450 mg, Oral, Daily   EPINEPHrine (EPI-PEN) 0.3 mg, Intramuscular, As needed   fenofibrate 160 mg, Oral, Daily   glipiZIDE (GLUCOTROL XL) 10 mg, Oral, Daily  lisinopril (ZESTRIL) 10 mg, Oral, Daily   metFORMIN (GLUCOPHAGE) 500 mg, Oral, 2 times daily with meals   propranolol (INDERAL) 40 mg, Oral, 2 times daily, (Needs to be seen before next refill)   rizatriptan (MAXALT) 10 mg, Oral, As needed, May repeat in 2 hours if needed   Rybelsus 7 mg, Oral, Daily   topiramate (TOPAMAX) 50 mg, Oral, 2 times daily     Diet Orders (From admission, onward)     Start     Ordered   12/28/22 0001  Diet NPO time specified Except for: Ice Chips, Sips with Meds  Diet effective midnight       Question Answer Comment  Except for Ice Chips   Except for Sips with Meds      12/27/22 2132            DVT prophylaxis: heparin injection 5,000 Units Start: 12/27/22 2200   Lab Results  Component Value Date   PLT 191 12/28/2022      Code Status: DNR  Family Communication: no family at bedside   Status is: Inpatient  Remains inpatient appropriate because: severity of illness  Level of care: Progressive  Consultants:  Cardiology   Objective: Vitals:   12/28/22 0300 12/28/22 0600 12/28/22 0623 12/28/22 0630  BP: (!) 136/104 114/70  110/62  Pulse: 79 63  (!) 58  Resp: 16 11  12   Temp:   (!) 97.5 F (36.4 C)   TempSrc:   Oral   SpO2: 100% 96%  98%  Weight:      Height:        Intake/Output Summary (Last 24 hours) at 12/28/2022 0649 Last data filed at 12/28/2022 1610 Gross per 24 hour  Intake 3299.08 ml  Output --  Net 3299.08 ml   Wt Readings from Last 3 Encounters:  12/27/22 91.2 kg  11/26/22 93 kg  08/21/22 91.2 kg    Examination:  Constitutional: NAD Eyes: no scleral icterus ENMT: Mucous membranes are moist.  Neck: normal, supple Respiratory: clear to auscultation bilaterally, no wheezing, no crackles.  Cardiovascular: Regular rate and rhythm, no murmurs / rubs / gallops.  Abdomen: non distended, no tenderness. Bowel sounds positive.  Musculoskeletal: no clubbing / cyanosis.   Data Reviewed: I have independently reviewed following labs and imaging studies   CBC Recent Labs  Lab 12/27/22 1618 12/27/22 1635 12/28/22 0453  WBC 10.5  --  8.6  HGB 11.8* 11.6* 10.4*  HCT 36.4 34.0* 32.0*  PLT 204  --  191  MCV 93.1  --  92.8  MCH 30.2  --  30.1  MCHC 32.4  --  32.5  RDW 13.6  --  13.8  LYMPHSABS 2.0  --   --   MONOABS 0.6  --   --   EOSABS 0.1  --   --    BASOSABS 0.1  --   --     Recent Labs  Lab 12/27/22 1618 12/27/22 1635  NA 135 138  K 3.8 3.9  CL 108 110  CO2 17*  --   GLUCOSE 294* 296*  BUN 27* 25*  CREATININE 1.19* 1.20*  CALCIUM 8.8*  --   DDIMER <0.27  --   BNP 46.8  --     ------------------------------------------------------------------------------------------------------------------ No results for input(s): "CHOL", "HDL", "LDLCALC", "TRIG", "CHOLHDL", "LDLDIRECT" in the last 72 hours.  Lab Results  Component Value Date   HGBA1C 9.1 (H) 11/26/2022   ------------------------------------------------------------------------------------------------------------------ No results for input(s): "TSH", "T4TOTAL", "T3FREE", "THYROIDAB" in the  last 72 hours.  Invalid input(s): "FREET3"  Cardiac Enzymes No results for input(s): "CKMB", "TROPONINI", "MYOGLOBIN" in the last 168 hours.  Invalid input(s): "CK" ------------------------------------------------------------------------------------------------------------------    Component Value Date/Time   BNP 46.8 12/27/2022 1618    CBG: Recent Labs  Lab 12/27/22 2152  GLUCAP 233*    Recent Results (from the past 240 hour(s))  SARS Coronavirus 2 by RT PCR (hospital order, performed in [facility] hospital lab) *cepheid single result test* Anterior Nasal Swab     Status: None   Collection Time: 12/27/22  4:18 PM   Specimen: Anterior Nasal Swab  Result Value Ref Range Status   SARS Coronavirus 2 by RT PCR NEGATIVE NEGATIVE Final    Comment: (NOTE) SARS-CoV-2 target nucleic acids are NOT DETECTED.  The SARS-CoV-2 RNA is generally detectable in upper and lower respiratory specimens during the acute phase of infection. The lowest concentration of SARS-CoV-2 viral copies this assay can detect is 250 copies / mL. A negative result does not preclude SARS-CoV-2 infection and should not be used as the sole basis for treatment or other patient management decisions.  A  negative result may occur with improper specimen collection / handling, submission of specimen other than nasopharyngeal swab, presence of viral mutation(s) within the areas targeted by this assay, and inadequate number of viral copies (<250 copies / mL). A negative result must be combined with clinical observations, patient history, and epidemiological information.  Fact Sheet for Patients:   RoadLapTop.co.za  Fact Sheet for Healthcare Providers: http://kim-miller.com/  This test is not yet approved or  cleared by the [place] FDA and has been authorized for detection and/or diagnosis of SARS-CoV-2 by FDA under an Emergency Use Authorization (EUA).  This EUA will remain in effect (meaning this test can be used) for the duration of the COVID-19 declaration under Section 564(b)(1) of the Act, 21 U.S.C. section 360bbb-3(b)(1), unless the authorization is terminated or revoked sooner.  Performed at [facility], [address], [place], [place] [address]      Radiology Studies: CT Angio Chest/Abd/Pel for Dissection W and/or Wo Contrast  Result Date: 12/27/2022 CLINICAL DATA:  Acute aortic syndrome suspected, chest pain EXAM: CT ANGIOGRAPHY CHEST, ABDOMEN AND PELVIS TECHNIQUE: Non-contrast CT of the chest was initially obtained. Multidetector CT imaging through the chest, abdomen and pelvis was performed using the standard protocol during bolus administration of intravenous contrast. Multiplanar reconstructed images and MIPs were obtained and reviewed to evaluate the vascular anatomy. RADIATION DOSE REDUCTION: This exam was performed according to the departmental dose-optimization program which includes automated exposure control, adjustment of the mA and/or kV according to patient size and/or use of iterative reconstruction technique. CONTRAST:  OMNIPAQUE IOHEXOL 350 MG/ML SOLN COMPARISON:  04/25/2019 FINDINGS: CTA  CHEST FINDINGS Cardiovascular: There is no demonstrable mural hematoma in thoracic aorta in noncontrast images. Coronary artery calcifications are seen. There are scattered calcifications in thoracic aorta. There is homogeneous enhancement in thoracic aorta. There is no demonstrable intimal flap. Major branches of the thoracic aorta appear patent. Atherosclerotic plaques are noted in the proximal left subclavian artery with moderate narrowing of lumen. There are no intraluminal filling defects in pulmonary artery branches. Mediastinum/Nodes: No acute findings are seen. Lungs/Pleura: There is no focal pulmonary consolidation. There is no pleural effusion or pneumothorax. Musculoskeletal: No acute findings are seen. Review of the MIP images confirms the above findings. CTA ABDOMEN AND PELVIS FINDINGS VASCULAR Aorta: Atherosclerotic plaques and calcifications are seen. There is no demonstrable  edema or flap. There is mild-to-moderate narrowing of lumen in infrarenal aorta. Celiac: Unremarkable. SMA: Unremarkable. Renals: There are 2 right renal arteries. There are scattered atherosclerotic plaques without high-grade stenosis. IMA: Patent. Inflow: Unremarkable. Veins: Unremarkable. Review of the MIP images confirms the above findings. NON-VASCULAR Hepatobiliary: There is fatty infiltration in liver. There is no dilation of bile ducts. Gallbladder is unremarkable. Pancreas: No focal abnormalities are seen. Spleen: Unremarkable. Adrenals/Urinary Tract: Adrenals are unremarkable. There is no hydronephrosis. There is 3 mm left renal calculus. Ureters are not dilated. Urinary bladder is not distended. Stomach/Bowel: Stomach is moderately distended. Small bowel loops are unremarkable. Appendix is not seen. There is no pericecal inflammation. There is no significant wall thickening in colon. Lymphatic: Unremarkable. Reproductive: Uterus is not seen.  Cervix is unremarkable. Other: There is no ascites or pneumoperitoneum. There  is previous ventral hernia repair. Musculoskeletal: No acute findings are seen. Review of the MIP images confirms the above findings. IMPRESSION: There is no evidence of dissection in thoracic aorta and abdominal aorta. Major branches of thoracic and abdominal aorta are patent. Coronary artery disease. Atherosclerotic plaques and calcifications are seen in thoracic aorta and abdominal aorta. There is mild narrowing of the lumen in infrarenal aorta. There is moderate narrowing of proximal course of the left subclavian artery. There is no evidence of pulmonary artery embolism. There is no focal pulmonary consolidation. There is no evidence of intestinal obstruction or pneumoperitoneum. There is no hydronephrosis. Fatty liver.  Left renal calculus. Other findings as described in the body of the report. Electronically Signed   By: Ernie Avena M.D.   On: 12/27/2022 18:14   DG Chest Portable 1 View  Result Date: 12/27/2022 CLINICAL DATA:  Chest pain and near syncope. EXAM: PORTABLE CHEST 1 VIEW COMPARISON:  April 06, 2020 FINDINGS: The heart size and mediastinal contours are within normal limits. Both lungs are clear. The visualized skeletal structures are unremarkable. IMPRESSION: No active disease. Electronically Signed   By: Aram Candela M.D.   On: 12/27/2022 16:44     Pamella Pert, MD, PhD [facility]  Between 7 am - 7 pm I am available, please contact me via Amion (for emergencies) or Securechat (non urgent messages)  Between 7 pm - 7 am I am not available, please contact night coverage MD/APP via Amion

## 2022-12-28 NOTE — Progress Notes (Signed)
Called by nurse Judeth Cornfield to inquire about having SL NTG rx on the chart in case it is needed. Patient is currently stable and not actively having chest pain but required NTG earlier on in admission so nurse wanted to have on MAR so that dosing + intervals were accessible by staff if needed. I have entered this order. Nurse knows to notify cardiology team of any recurrent CP.

## 2022-12-28 NOTE — ED Notes (Signed)
Pt currently denies any needs or complaints at this time, resting comfortably in bed, care ongoing.

## 2022-12-28 NOTE — Consult Note (Signed)
Cardiology Consult:   Patient ID: [name]; MRN: [id number]; DOB: Oct 31, 1960   Admission date: 12/27/2022  Primary Care Provider: Bennie Pierini, FNP Primary Cardiologist: Prior [facility]-. New to [facility]  Chief Complaint:  Chest pain  Patient Profile:   [name] is a 61 y.o. female with HTN, HLD and T2DM.    History of Present Illness:   Ms. Wayment is feeling poorly.    She notes that historically she has been reasonably active.  She has an echo in 2014 that was unremarkable.  She lost her husband during the COVID-19 pandemic and has been slightly less active since.  Could still walk ~ 1-1.5 miles with dogs.  This spring/summer she had had chest pain that radiates to her left arm with exertion (walking her dog).  This worsened on 7/4-7/5 when she moved 40 boxes herself as a part of a move into a new residence.  She was evaluated by [facility] Card[facility], was not able to afford the out of network copay for stress testing.  She had been referred to see me for 01/24/23.  Was at lunch with her friends on 12/27/22 when she had diaphoresis, nausea and felt like she was going to die.  Found to be significantly hypotensive.   Found to have BP 60/30.   - given IVF with improvement if BP and some symptoms - no ASA given in the setting of ASA anaphylaxis - HS troponin 38-> 34  No shortness of breath, DOE .  No PND or orthopnea.  No weight gain, leg swelling , or abdominal swelling.  Notes  no palpitations or funny heart beats.   CP has improved as she is resting.   Allergies:    Allergies  Allergen Reactions   Aspirin Anaphylaxis   Bee Venom Anaphylaxis   Benadryl [Diphenhydramine Hcl] Anaphylaxis   Morphine And Codeine Shortness Of Breath and Itching   Vicks Formula 44 Cough-Cold Pm [Dm-Apap-Cpm] Shortness Of Breath and Rash    Not anaphylaxis    Social History:   Social History   Socioeconomic History   Marital status: Widowed    Spouse  name: Not on file   Number of children: 0   Years of education: Not on file   Highest education level: Some college, no degree  Occupational History   Occupation: Theatre manager  Tobacco Use   Smoking status: Never   Smokeless tobacco: Never  Vaping Use   Vaping status: Not on file  Substance and Sexual Activity   Alcohol use: No   Drug use: No   Sexual activity: Not Currently  Other Topics Concern   Not on file  Social History Narrative   Not on file   Social Determinants of Health   Financial Resource Strain: Low Risk  (12/24/2022)   Received from [facility]   Overall Financial Resource Strain (CARDIA)    Difficulty of Paying Living Expenses: Not hard at all  Recent Concern: Financial Resource Strain - High Risk (11/23/2022)   Overall Financial Resource Strain (CARDIA)    Difficulty of Paying Living Expenses: Hard  Food Insecurity: No Food Insecurity (12/24/2022)   Received from [facility]   Hunger Vital Sign    Worried About Running Out of Food in the Last Year: Never true    Ran Out of Food in the Last Year: Never true  Recent Concern: Food Insecurity - Food Insecurity Present (11/23/2022)   Hunger Vital Sign    Worried About Running  Out of Food in the Last Year: Sometimes true    Ran Out of Food in the Last Year: Never true  Transportation Needs: No Transportation Needs (12/24/2022)   Received from [facility] - Transportation    Lack of Transportation (Medical): No    Lack of Transportation (Non-Medical): No  Physical Activity: Insufficiently Active (11/23/2022)   Exercise Vital Sign    Days of Exercise per Week: 3 days    Minutes of Exercise per Session: 30 min  Stress: No Stress Concern Present (11/23/2022)   [employer] of Occupational Health - Occupational Stress Questionnaire    Feeling of Stress : Only a little  Social Connections: Moderately Integrated (11/23/2022)   Social Connection and Isolation Panel [NHANES]     Frequency of Communication with Friends and Family: Three times a week    Frequency of Social Gatherings with Friends and Family: Once a week    Attends Religious Services: 1 to 4 times per year    Active Member of [employer] or Organizations: Yes    Attends Banker Meetings: 1 to 4 times per year    Marital Status: Widowed  Intimate Partner Violence: Not At Risk (12/23/2022)   Received from [facility]   HITS    Over the last 12 months how often did your partner physically hurt you?: 1    Over the last 12 months how often did your partner insult you or talk down to you?: 1    Over the last 12 months how often did your partner threaten you with physical harm?: 1    Over the last 12 months how often did your partner scream or curse at you?: 1    Family History:   Both parents died of non cardiac causes  ROS:  Please see the history of present illness.   Physical Exam/Data:   Vitals:   12/28/22 0600 12/28/22 0623 12/28/22 0630 12/28/22 0728  BP: 114/70  110/62 117/64  Pulse: 63  (!) 58 61  Resp: 11  12 15   Temp:  (!) 97.5 F (36.4 C)  97.8 F (36.6 C)  TempSrc:  Oral  Oral  SpO2: 96%  98% 99%  Weight:      Height:        Intake/Output Summary (Last 24 hours) at 12/28/2022 1037 Last data filed at 12/28/2022 6578 Gross per 24 hour  Intake 3299.08 ml  Output --  Net 3299.08 ml   Filed Weights   12/27/22 1611  Weight: 91.2 kg   Body mass index is 34.5 kg/m.   Gen: Mild distress, Morbid Obesity  Neck: No JVD Cardiac: No Rubs or Gallops, Systolic crescendo murmur, RRR, +2 radial pulses Respiratory: Clear to auscultation bilaterally, normal effort, normal  respiratory rate GI: Soft, nontender, non-distended  MS: No edema; moves all extremities Integument: Skin feels warm Neuro:  At time of evaluation, alert and oriented to person/place/time/situation  Psych: Normal affect, patient feels warm  EKG:  The ECG that was done  was personally reviewed and  demonstrates SR  Telemetry: SR with artifact  Laboratory Data:  Chemistry Recent Labs  Lab 12/27/22 1618 12/27/22 1635 12/28/22 0453  NA 135 138 134*  K 3.8 3.9 4.0  CL 108 110 108  CO2 17*  --  16*  GLUCOSE 294* 296* 262*  BUN 27* 25* 23  CREATININE 1.19* 1.20* 0.96  CALCIUM 8.8*  --  8.9  GFRNONAA 52*  --  >60  ANIONGAP 10  --  10    No results for input(s): "PROT", "ALBUMIN", "AST", "ALT", "ALKPHOS", "BILITOT" in the last 168 hours. Hematology Recent Labs  Lab 12/27/22 1618 12/27/22 1635 12/28/22 0453  WBC 10.5  --  8.6  RBC 3.91  --  3.45*  HGB 11.8* 11.6* 10.4*  HCT 36.4 34.0* 32.0*  MCV 93.1  --  92.8  MCH 30.2  --  30.1  MCHC 32.4  --  32.5  RDW 13.6  --  13.8  PLT 204  --  191   Cardiac EnzymesNo results for input(s): "TROPONINI" in the last 168 hours. No results for input(s): "TROPIPOC" in the last 168 hours.  BNP Recent Labs  Lab 12/27/22 1618  BNP 46.8    DDimer  Recent Labs  Lab 12/27/22 1618  DDIMER <0.27   Assessment and Plan:   Precordial Pain - The patient presents with cardiac chest pain - CT shows Aortic valve calcifications, 3V CAC more prominent in the LAD, aortic atherosclerosis - with risk factors of HTN, HLD, and DM - given her ASA allergy (anaphylaxis), and hypotension; will get echo today (will attempt despite global [employer] network outage - if WMA, will proceed with non urgent LHC likely Monday - if not, and if BP continues to improve, will pursue CCTA - fasting Lipids and Lp(a) in AM - continue atorvastatin 40 mg for now, holding home BB and ARB for BP    For questions or updates, please contact [facility] Please consult [email or web address] for contact info under Cardiology/STEMI.   Riley Lam, MD FASE [facility] Cardiologist [facility]  [address] [place], [address] [place], [place] [address] [phone]  10:37 AM

## 2022-12-28 NOTE — ED Notes (Signed)
Assumed care of patient. Patient resting comfortably in bed with no signs of acute distress noted. Waiting on ready bed at [facility].

## 2022-12-28 NOTE — Progress Notes (Signed)
ANTICOAGULATION CONSULT NOTE - Initial Consult  Pharmacy Consult for Heparin Indication:  severe multivessel CAD  Allergies  Allergen Reactions   Aspirin Anaphylaxis   Bee Venom Anaphylaxis   Benadryl [Diphenhydramine Hcl] Anaphylaxis   Morphine And Codeine Shortness Of Breath and Itching   Vicks Formula 44 Cough-Cold Pm [Dm-Apap-Cpm] Shortness Of Breath and Rash    Not anaphylaxis    Patient Measurements: Height: 5\' 4"  (162.6 cm) Weight: 91.2 kg (201 lb) IBW/kg (Calculated) : 54.7 Heparin Dosing Weight: 75 kg  Vital Signs: Temp: 97.9 F (36.6 C) (07/19 1949) Temp Source: Oral (07/19 1949) BP: 126/63 (07/19 2103) Pulse Rate: 81 (07/19 2103)  Labs: Recent Labs    12/27/22 1618 12/27/22 1635 12/27/22 1810 12/28/22 0453  HGB 11.8* 11.6*  --  10.4*  HCT 36.4 34.0*  --  32.0*  PLT 204  --   --  191  CREATININE 1.19* 1.20*  --  0.96  TROPONINIHS 38*  --  34*  --     Estimated Creatinine Clearance: 66.5 mL/min (by C-G formula based on SCr of 0.96 mg/dL).   Medical History: Past Medical History:  Diagnosis Date   Abdominal discomfort    Chronic abdominal and pelvic pain resulting in significant loss of time from work   Anxiety    with depression   Asthma    Chest pain    Depression    Diabetes mellitus    Drug overdose 06/12/2007   60 Naprosyn, psychiatric admission   Hyperlipidemia    Severe   Hypertension    Migraines    Tremor of both hands    on propranolol    Medications:  Medications Prior to Admission  Medication Sig Dispense Refill Last Dose   albuterol (VENTOLIN HFA) 108 (90 Base) MCG/ACT inhaler Inhale 2 puffs into the lungs every 4 (four) hours as needed for wheezing or shortness of breath. (NEEDS TO BE SEEN) 18 g 1 unk   atorvastatin (LIPITOR) 40 MG tablet Take 40 mg by mouth daily.   12/27/2022   benazepril (LOTENSIN) 20 MG tablet Take 1 tablet (20 mg total) by mouth daily. 90 tablet 1 12/27/2022   buPROPion (WELLBUTRIN XL) 150 MG 24 hr  tablet TAKE 3 TABLETS BY MOUTH DAILY. 270 tablet 0 12/27/2022   EPINEPHrine 0.3 mg/0.3 mL IJ SOAJ injection Inject 0.3 mg into the muscle as needed for anaphylaxis. 2 each 2 unk   fenofibrate 160 MG tablet Take 1 tablet (160 mg total) by mouth daily. 90 tablet 1 12/27/2022   glipiZIDE (GLUCOTROL XL) 10 MG 24 hr tablet Take 1 tablet (10 mg total) by mouth daily. 180 tablet 1 12/26/2022   lisinopril (ZESTRIL) 10 MG tablet Take 1 tablet (10 mg total) by mouth daily. 90 tablet 1 12/26/2022   metFORMIN (GLUCOPHAGE) 500 MG tablet Take 1 tablet (500 mg total) by mouth 2 (two) times daily with a meal. 180 tablet 1 12/27/2022 at am   propranolol (INDERAL) 40 MG tablet Take 1 tablet (40 mg total) by mouth 2 (two) times daily. (Needs to be seen before next refill) 180 tablet 1 Past Week   rizatriptan (MAXALT) 10 MG tablet Take 1 tablet (10 mg total) by mouth as needed for migraine. May repeat in 2 hours if needed 10 tablet 2 unk   RYBELSUS 7 MG TABS TAKE 1 TABLET (7 MG TOTAL) BY MOUTH DAILY 90 tablet 1 12/27/2022   topiramate (TOPAMAX) 50 MG tablet Take 1 tablet (50 mg total) by mouth 2 (two)  times daily. 180 tablet 1 12/27/2022    Assessment: 62 y.o. F presents with CP. Found to have severe multivessel disease on cath. To start heparin 2 hours post TR band removal. TR band removed 7/19 2100. For CVTS consult.  Goal of Therapy:  Heparin level 0.3-0.7 units/ml Monitor platelets by anticoagulation protocol: Yes   Plan:  D/c SQ heparin At 2300, start heparin gtt at 1150 units/hr. No bolus. Will f/u heparin level in 8 hours Daily heparin level and CBC  Christoper Fabian, PharmD, BCPS Please see amion for complete clinical pharmacist phone list 12/28/2022,9:11 PM

## 2022-12-28 NOTE — Interval H&P Note (Signed)
History and Physical Interval Note:  12/28/2022 4:22 PM  [name]  has presented today for surgery, with the diagnosis of unstable angina.  The various methods of treatment have been discussed with the patient and family. After consideration of risks, benefits and other options for treatment, the patient has consented to  Procedure(s): LEFT HEART CATH AND CORONARY ANGIOGRAPHY (N/A) as a surgical intervention.  The patient's history has been reviewed, patient examined, no change in status, stable for surgery.  I have reviewed the patient's chart and labs.  Questions were answered to the patient's satisfaction.     [name]

## 2022-12-28 NOTE — H&P (View-Only) (Signed)
Cardiology Consult:   Patient ID: [name]; MRN: [id number]; DOB: Oct 31, 1960   Admission date: 12/27/2022  Primary Care Provider: Bennie Pierini, FNP Primary Cardiologist: Prior [facility]-. New to [facility]  Chief Complaint:  Chest pain  Patient Profile:   [name] is a 61 y.o. female with HTN, HLD and T2DM.    History of Present Illness:   Ms. Wayment is feeling poorly.    She notes that historically she has been reasonably active.  She has an echo in 2014 that was unremarkable.  She lost her husband during the COVID-19 pandemic and has been slightly less active since.  Could still walk ~ 1-1.5 miles with dogs.  This spring/summer she had had chest pain that radiates to her left arm with exertion (walking her dog).  This worsened on 7/4-7/5 when she moved 40 boxes herself as a part of a move into a new residence.  She was evaluated by [facility], was not able to afford the out of network copay for stress testing.  She had been referred to see me for 01/24/23.  Was at lunch with her friends on 12/27/22 when she had diaphoresis, nausea and felt like she was going to die.  Found to be significantly hypotensive.   Found to have BP 60/30.   - given IVF with improvement if BP and some symptoms - no ASA given in the setting of ASA anaphylaxis - HS troponin 38-> 34  No shortness of breath, DOE .  No PND or orthopnea.  No weight gain, leg swelling , or abdominal swelling.  Notes  no palpitations or funny heart beats.   CP has improved as she is resting.   Allergies:    Allergies  Allergen Reactions   Aspirin Anaphylaxis   Bee Venom Anaphylaxis   Benadryl [Diphenhydramine Hcl] Anaphylaxis   Morphine And Codeine Shortness Of Breath and Itching   Vicks Formula 44 Cough-Cold Pm [Dm-Apap-Cpm] Shortness Of Breath and Rash    Not anaphylaxis    Social History:   Social History   Socioeconomic History   Marital status: Widowed    Spouse  name: Not on file   Number of children: 0   Years of education: Not on file   Highest education level: Some college, no degree  Occupational History   Occupation: Theatre manager  Tobacco Use   Smoking status: Never   Smokeless tobacco: Never  Vaping Use   Vaping status: Not on file  Substance and Sexual Activity   Alcohol use: No   Drug use: No   Sexual activity: Not Currently  Other Topics Concern   Not on file  Social History Narrative   Not on file   Social Determinants of Health   Financial Resource Strain: Low Risk  (12/24/2022)   Received from [facility]   Overall Financial Resource Strain (CARDIA)    Difficulty of Paying Living Expenses: Not hard at all  Recent Concern: Financial Resource Strain - High Risk (11/23/2022)   Overall Financial Resource Strain (CARDIA)    Difficulty of Paying Living Expenses: Hard  Food Insecurity: No Food Insecurity (12/24/2022)   Received from [facility]   Hunger Vital Sign    Worried About Running Out of Food in the Last Year: Never true    Ran Out of Food in the Last Year: Never true  Recent Concern: Food Insecurity - Food Insecurity Present (11/23/2022)   Hunger Vital Sign    Worried About Running  Out of Food in the Last Year: Sometimes true    Ran Out of Food in the Last Year: Never true  Transportation Needs: No Transportation Needs (12/24/2022)   Received from [facility] - Transportation    Lack of Transportation (Medical): No    Lack of Transportation (Non-Medical): No  Physical Activity: Insufficiently Active (11/23/2022)   Exercise Vital Sign    Days of Exercise per Week: 3 days    Minutes of Exercise per Session: 30 min  Stress: No Stress Concern Present (11/23/2022)   [employer] of Occupational Health - Occupational Stress Questionnaire    Feeling of Stress : Only a little  Social Connections: Moderately Integrated (11/23/2022)   Social Connection and Isolation Panel [NHANES]     Frequency of Communication with Friends and Family: Three times a week    Frequency of Social Gatherings with Friends and Family: Once a week    Attends Religious Services: 1 to 4 times per year    Active Member of [employer] or Organizations: Yes    Attends Banker Meetings: 1 to 4 times per year    Marital Status: Widowed  Intimate Partner Violence: Not At Risk (12/23/2022)   Received from [facility]   HITS    Over the last 12 months how often did your partner physically hurt you?: 1    Over the last 12 months how often did your partner insult you or talk down to you?: 1    Over the last 12 months how often did your partner threaten you with physical harm?: 1    Over the last 12 months how often did your partner scream or curse at you?: 1    Family History:   Both parents died of non cardiac causes  ROS:  Please see the history of present illness.   Physical Exam/Data:   Vitals:   12/28/22 0600 12/28/22 0623 12/28/22 0630 12/28/22 0728  BP: 114/70  110/62 117/64  Pulse: 63  (!) 58 61  Resp: 11  12 15   Temp:  (!) 97.5 F (36.4 C)  97.8 F (36.6 C)  TempSrc:  Oral  Oral  SpO2: 96%  98% 99%  Weight:      Height:        Intake/Output Summary (Last 24 hours) at 12/28/2022 1037 Last data filed at 12/28/2022 6578 Gross per 24 hour  Intake 3299.08 ml  Output --  Net 3299.08 ml   Filed Weights   12/27/22 1611  Weight: 91.2 kg   Body mass index is 34.5 kg/m.   Gen: Mild distress, Morbid Obesity  Neck: No JVD Cardiac: No Rubs or Gallops, Systolic crescendo murmur, RRR, +2 radial pulses Respiratory: Clear to auscultation bilaterally, normal effort, normal  respiratory rate GI: Soft, nontender, non-distended  MS: No edema; moves all extremities Integument: Skin feels warm Neuro:  At time of evaluation, alert and oriented to person/place/time/situation  Psych: Normal affect, patient feels warm  EKG:  The ECG that was done  was personally reviewed and  demonstrates SR  Telemetry: SR with artifact  Laboratory Data:  Chemistry Recent Labs  Lab 12/27/22 1618 12/27/22 1635 12/28/22 0453  NA 135 138 134*  K 3.8 3.9 4.0  CL 108 110 108  CO2 17*  --  16*  GLUCOSE 294* 296* 262*  BUN 27* 25* 23  CREATININE 1.19* 1.20* 0.96  CALCIUM 8.8*  --  8.9  GFRNONAA 52*  --  >60  ANIONGAP 10  --  10    No results for input(s): "PROT", "ALBUMIN", "AST", "ALT", "ALKPHOS", "BILITOT" in the last 168 hours. Hematology Recent Labs  Lab 12/27/22 1618 12/27/22 1635 12/28/22 0453  WBC 10.5  --  8.6  RBC 3.91  --  3.45*  HGB 11.8* 11.6* 10.4*  HCT 36.4 34.0* 32.0*  MCV 93.1  --  92.8  MCH 30.2  --  30.1  MCHC 32.4  --  32.5  RDW 13.6  --  13.8  PLT 204  --  191   Cardiac EnzymesNo results for input(s): "TROPONINI" in the last 168 hours. No results for input(s): "TROPIPOC" in the last 168 hours.  BNP Recent Labs  Lab 12/27/22 1618  BNP 46.8    DDimer  Recent Labs  Lab 12/27/22 1618  DDIMER <0.27   Assessment and Plan:   Precordial Pain - The patient presents with cardiac chest pain - CT shows Aortic valve calcifications, 3V CAC more prominent in the LAD, aortic atherosclerosis - with risk factors of HTN, HLD, and DM - given her ASA allergy (anaphylaxis), and hypotension; will get echo today (will attempt despite global [employer] network outage - if WMA, will proceed with non urgent LHC likely Monday - if not, and if BP continues to improve, will pursue CCTA - fasting Lipids and Lp(a) in AM - continue atorvastatin 40 mg for now, holding home BB and ARB for BP    For questions or updates, please contact [facility] Please consult [email or web address] for contact info under Cardiology/STEMI.   Riley Lam, MD FASE [facility] Cardiologist [facility]  [address] [place], [address] [place], [place] [address] [phone]  10:37 AM

## 2022-12-28 NOTE — Progress Notes (Signed)
2053 -Pt having CP 9/10 BP 151/67. EKG done and MD notified. Pt given a total of 3 nitroglycerin tablets. Last BP 124/61 and patient states pain is resolving. No new orders received.

## 2022-12-28 NOTE — Care Management (Signed)
Transition of Care [facility]) - Inpatient Brief Assessment   Patient Details  Name: [name] MRN: [id number] Date of Birth: [birth year]  [facility] CM/SW Contact:    Lockie Pares, RN Phone Number: 12/28/2022, 3:23 PM   Clinical Narrative: Patient with chest pain, history of DM heart disease in family. Had dizziness, chest pain when out to lunch. EMS called, hypotensive at 60/30 fluids given. Had re-occurring chest pain in house that decreased with SL NTG, however she will be going emergently to cath lab.  No needs ID at this time, however TOC will follow for needs, recommendations, and transitions of care.   Transition of Care Asessment: Insurance and Status: Insurance coverage has been reviewed Patient has primary care physician: Yes Home environment has been reviewed: yes Prior level of function:: independent Prior/Current Home Services: No current home services Social Determinants of Health Reivew: SDOH reviewed no interventions necessary Readmission risk has been reviewed: Yes Transition of care needs: no transition of care needs at this time

## 2022-12-28 NOTE — ED Notes (Signed)
ED TO INPATIENT HANDOFF REPORT  Name/Age/Gender [name] 62 y.o. female  Code Status    Code Status Orders  (From admission, onward)           Start     Ordered   12/27/22 2133  Do not attempt resuscitation (DNR)  Continuous       Question Answer Comment  If patient has no pulse and is not breathing Do Not Attempt Resuscitation   If patient has a pulse and/or is breathing: Medical Treatment Goals LIMITED ADDITIONAL INTERVENTIONS: Use medication/IV fluids and cardiac monitoring as indicated; Do not use intubation or mechanical ventilation (DNI), also provide comfort medications.  Transfer to Progressive/Stepdown as indicated, avoid Intensive Care.   Consent: Discussion documented in EHR or advanced directives reviewed      12/27/22 2134           Code Status History     Date Active Date Inactive Code Status Order ID Comments User Context   08/16/2021 1510 08/17/2021 1958 Full Code [id number]  Marlyne Beards, MD Inpatient   07/13/2016 1150 07/16/2016 1701 Full Code [id number]  Cleora Fleet, MD Inpatient   12/04/2012 0135 12/05/2012 1728 Full Code [id number]  Kathlen Mody, MD Inpatient       Home/SNF/Other Home  Chief Complaint Chest pain [R07.9]  Level of Care/Admitting Diagnosis ED Disposition     ED Disposition  Admit   Condition  --   Comment  Hospital Area: [facility] [[id number]]  Level of Care: Progressive [102]  Admit to Progressive based on following criteria: CARDIOVASCULAR & THORACIC of moderate stability with acute coronary syndrome symptoms/low risk myocardial infarction/hypertensive urgency/arrhythmias/heart failure potentially compromising stability and stable post cardiovascular intervention patients.  May admit patient to [facility] or [facility] if equivalent level of care is available:: No  Covid Evaluation: Confirmed COVID Negative  Diagnosis: Chest pain [744799]  Admitting Physician: Charlsie Quest [[id number]]   Attending Physician: Charlsie Quest [[id number]]  Certification:: I certify this patient will need inpatient services for at least 2 midnights  Estimated Length of Stay: 2          Medical History Past Medical History:  Diagnosis Date   Abdominal discomfort    Chronic abdominal and pelvic pain resulting in significant loss of time from work   Anxiety    with depression   Asthma    Chest pain    Depression    Diabetes mellitus    Drug overdose 06/12/2007   60 Naprosyn, psychiatric admission   Hyperlipidemia    Severe   Hypertension    Migraines    Tremor of both hands    on propranolol    Allergies Allergies  Allergen Reactions   Aspirin Anaphylaxis   Bee Venom Anaphylaxis   Benadryl [Diphenhydramine Hcl] Anaphylaxis   Morphine And Codeine Shortness Of Breath and Itching   Vicks Formula 44 Cough-Cold Pm [Dm-Apap-Cpm] Shortness Of Breath and Rash    Not anaphylaxis    IV Location/Drains/Wounds Patient Lines/Drains/Airways Status     Active Line/Drains/Airways     Name Placement date Placement time Site Days   Peripheral IV 12/27/22 20 G 1" Right Antecubital 12/27/22  1608  Antecubital  1   Incision (Closed) 08/16/21 Hand Right 08/16/21  1218  -- 499            Labs/Imaging Results for orders placed or performed during the hospital encounter of 12/27/22 (from the past 48 hour(s))  Basic metabolic panel  Status: Abnormal   Collection Time: 12/27/22  4:18 PM  Result Value Ref Range   Sodium 135 135 - 145 mmol/L   Potassium 3.8 3.5 - 5.1 mmol/L   Chloride 108 98 - 111 mmol/L   CO2 17 (L) 22 - 32 mmol/L   Glucose, Bld 294 (H) 70 - 99 mg/dL    Comment: Glucose reference range applies only to samples taken after fasting for at least 8 hours.   BUN 27 (H) 8 - 23 mg/dL   Creatinine, Ser 9.52 (H) 0.44 - 1.00 mg/dL   Calcium 8.8 (L) 8.9 - 10.3 mg/dL   GFR, Estimated 52 (L) >60 mL/min    Comment: (NOTE) Calculated using the CKD-EPI Creatinine Equation  (2021)    Anion gap 10 5 - 15    Comment: Performed at [facility], [address], [place], [place] [address]  CBC with Differential     Status: Abnormal   Collection Time: 12/27/22  4:18 PM  Result Value Ref Range   WBC 10.5 4.0 - 10.5 K/uL   RBC 3.91 3.87 - 5.11 MIL/uL   Hemoglobin 11.8 (L) 12.0 - 15.0 g/dL   HCT 44.0 10.2 - 72.5 %   MCV 93.1 80.0 - 100.0 fL   MCH 30.2 26.0 - 34.0 pg   MCHC 32.4 30.0 - 36.0 g/dL   RDW 36.6 44.0 - 34.7 %   Platelets 204 150 - 400 K/uL   nRBC 0.0 0.0 - 0.2 %   Neutrophils Relative % 70 %   Neutro Abs 7.4 1.7 - 7.7 K/uL   Lymphocytes Relative 20 %   Lymphs Abs 2.0 0.7 - 4.0 K/uL   Monocytes Relative 6 %   Monocytes Absolute 0.6 0.1 - 1.0 K/uL   Eosinophils Relative 1 %   Eosinophils Absolute 0.1 0.0 - 0.5 K/uL   Basophils Relative 1 %   Basophils Absolute 0.1 0.0 - 0.1 K/uL   Immature Granulocytes 2 %   Abs Immature Granulocytes 0.24 (H) 0.00 - 0.07 K/uL    Comment: Performed at [facility], [address], [place], [place] [address]  Troponin I (High Sensitivity)     Status: Abnormal   Collection Time: 12/27/22  4:18 PM  Result Value Ref Range   Troponin I (High Sensitivity) 38 (H) <18 ng/L    Comment: (NOTE) Elevated high sensitivity troponin I (hsTnI) values and significant  changes across serial measurements may suggest ACS but many other  chronic and acute conditions are known to elevate hsTnI results.  Refer to the "Links" section for chest pain algorithms and additional  guidance. Performed at [facility], [address], [place], [place] [address]   Brain natriuretic peptide     Status: None   Collection Time: 12/27/22  4:18 PM  Result Value Ref Range   B Natriuretic Peptide 46.8 0.0 - 100.0 pg/mL    Comment: Performed at [facility], [address], [place], [place] [address]  D-dimer, quantitative     Status: None   Collection Time: 12/27/22   4:18 PM  Result Value Ref Range   D-Dimer, Quant <0.27 0.00 - 0.50 ug/mL-FEU    Comment: (NOTE) At the manufacturer cut-off value of 0.5 g/mL FEU, this assay has a negative predictive value of 95-100%.This assay is intended for use in conjunction with a clinical pretest probability (PTP) assessment model to exclude pulmonary embolism (PE) and deep venous thrombosis (DVT) in outpatients suspected of PE or  DVT. Results should be correlated with clinical presentation. Performed at [facility], [address], [place], [place] [address]   SARS Coronavirus 2 by RT PCR (hospital order, performed in [facility] hospital lab) *cepheid single result test* Anterior Nasal Swab     Status: None   Collection Time: 12/27/22  4:18 PM   Specimen: Anterior Nasal Swab  Result Value Ref Range   SARS Coronavirus 2 by RT PCR NEGATIVE NEGATIVE    Comment: (NOTE) SARS-CoV-2 target nucleic acids are NOT DETECTED.  The SARS-CoV-2 RNA is generally detectable in upper and lower respiratory specimens during the acute phase of infection. The lowest concentration of SARS-CoV-2 viral copies this assay can detect is 250 copies / mL. A negative result does not preclude SARS-CoV-2 infection and should not be used as the sole basis for treatment or other patient management decisions.  A negative result may occur with improper specimen collection / handling, submission of specimen other than nasopharyngeal swab, presence of viral mutation(s) within the areas targeted by this assay, and inadequate number of viral copies (<250 copies / mL). A negative result must be combined with clinical observations, patient history, and epidemiological information.  Fact Sheet for Patients:   RoadLapTop.co.za  Fact Sheet for Healthcare Providers: http://kim-miller.com/  This test is not yet approved or  cleared by the [place] FDA and has been authorized for  detection and/or diagnosis of SARS-CoV-2 by FDA under an Emergency Use Authorization (EUA).  This EUA will remain in effect (meaning this test can be used) for the duration of the COVID-19 declaration under Section 564(b)(1) of the Act, 21 U.S.C. section 360bbb-3(b)(1), unless the authorization is terminated or revoked sooner.  Performed at [facility], [address], [place], [place] [address]   I-stat chem 8, ED (not at [facility], [facility] or [facility])     Status: Abnormal   Collection Time: 12/27/22  4:35 PM  Result Value Ref Range   Sodium 138 135 - 145 mmol/L   Potassium 3.9 3.5 - 5.1 mmol/L   Chloride 110 98 - 111 mmol/L   BUN 25 (H) 8 - 23 mg/dL   Creatinine, Ser 8.29 (H) 0.44 - 1.00 mg/dL   Glucose, Bld 562 (H) 70 - 99 mg/dL    Comment: Glucose reference range applies only to samples taken after fasting for at least 8 hours.   Calcium, Ion 1.14 (L) 1.15 - 1.40 mmol/L   TCO2 16 (L) 22 - 32 mmol/L   Hemoglobin 11.6 (L) 12.0 - 15.0 g/dL   HCT 13.0 (L) 86.5 - 78.4 %  Troponin I (High Sensitivity)     Status: Abnormal   Collection Time: 12/27/22  6:10 PM  Result Value Ref Range   Troponin I (High Sensitivity) 34 (H) <18 ng/L    Comment: (NOTE) Elevated high sensitivity troponin I (hsTnI) values and significant  changes across serial measurements may suggest ACS but many other  chronic and acute conditions are known to elevate hsTnI results.  Refer to the "Links" section for chest pain algorithms and additional  guidance. Performed at [facility], [address], [place], [place] [address]   CBG monitoring, ED     Status: Abnormal   Collection Time: 12/27/22  9:52 PM  Result Value Ref Range   Glucose-Capillary 233 (H) 70 - 99 mg/dL    Comment: Glucose reference range applies only to samples taken after fasting for at least 8 hours.  HIV Antibody (routine testing w  rflx)     Status: None   Collection Time: 12/28/22  4:53 AM  Result Value Ref  Range   HIV Screen 4th Generation wRfx Non Reactive Non Reactive    Comment: Performed at [facility], [address] [place], [place] [address]  Lipid panel     Status: Abnormal   Collection Time: 12/28/22  4:53 AM  Result Value Ref Range   Cholesterol 261 (H) 0 - 200 mg/dL    Comment:        ATP III CLASSIFICATION:  <200     mg/dL   Desirable  604-540  mg/dL   Borderline High  >=981    mg/dL   High           Triglycerides 594 (H) <150 mg/dL   HDL 38 (L) >19 mg/dL   Total CHOL/HDL Ratio 6.9 RATIO   VLDL UNABLE TO CALCULATE IF TRIGLYCERIDE OVER 400 mg/dL 0 - 40 mg/dL   LDL Cholesterol UNABLE TO CALCULATE IF TRIGLYCERIDE OVER 400 mg/dL 0 - 99 mg/dL    Comment:        Total Cholesterol/HDL:CHD Risk Coronary Heart Disease Risk Table                     Men   Women  1/2 Average Risk   3.4   3.3  Average Risk       5.0   4.4  2 X Average Risk   9.6   7.1  3 X Average Risk  23.4   11.0        Use the calculated Patient Ratio above and the CHD Risk Table to determine the patient's CHD Risk.        ATP III CLASSIFICATION (LDL):  <100     mg/dL   Optimal  147-829  mg/dL   Near or Above                    Optimal  130-159  mg/dL   Borderline  562-130  mg/dL   High  >865     mg/dL   Very High Performed at [facility], [address], [place], [place] [address]   Basic metabolic panel     Status: Abnormal   Collection Time: 12/28/22  4:53 AM  Result Value Ref Range   Sodium 134 (L) 135 - 145 mmol/L   Potassium 4.0 3.5 - 5.1 mmol/L   Chloride 108 98 - 111 mmol/L   CO2 16 (L) 22 - 32 mmol/L   Glucose, Bld 262 (H) 70 - 99 mg/dL    Comment: Glucose reference range applies only to samples taken after fasting for at least 8 hours.   BUN 23 8 - 23 mg/dL   Creatinine, Ser 6.29 0.44 - 1.00 mg/dL   Calcium 8.9 8.9 - 52.8 mg/dL   GFR, Estimated >41 >32 mL/min    Comment: (NOTE) Calculated using the CKD-EPI Creatinine Equation (2021)    Anion gap 10 5 - 15     Comment: Performed at [facility], [address] [place], [place] [address]  CBC     Status: Abnormal   Collection Time: 12/28/22  4:53 AM  Result Value Ref Range   WBC 8.6 4.0 - 10.5 K/uL   RBC 3.45 (L) 3.87 - 5.11 MIL/uL   Hemoglobin 10.4 (L) 12.0 - 15.0 g/dL   HCT 27.2 (L) 53.6 - 64.4 %   MCV 92.8 80.0 -  100.0 fL   MCH 30.1 26.0 - 34.0 pg   MCHC 32.5 30.0 - 36.0 g/dL   RDW 95.6 21.3 - 08.6 %   Platelets 191 150 - 400 K/uL   nRBC 0.0 0.0 - 0.2 %    Comment: Performed at [facility], [address], [place], [place] [address]  CBG monitoring, ED     Status: Abnormal   Collection Time: 12/28/22  8:31 AM  Result Value Ref Range   Glucose-Capillary 217 (H) 70 - 99 mg/dL    Comment: Glucose reference range applies only to samples taken after fasting for at least 8 hours.   CT Angio Chest/Abd/Pel for Dissection W and/or Wo Contrast  Result Date: 12/27/2022 CLINICAL DATA:  Acute aortic syndrome suspected, chest pain EXAM: CT ANGIOGRAPHY CHEST, ABDOMEN AND PELVIS TECHNIQUE: Non-contrast CT of the chest was initially obtained. Multidetector CT imaging through the chest, abdomen and pelvis was performed using the standard protocol during bolus administration of intravenous contrast. Multiplanar reconstructed images and MIPs were obtained and reviewed to evaluate the vascular anatomy. RADIATION DOSE REDUCTION: This exam was performed according to the departmental dose-optimization program which includes automated exposure control, adjustment of the mA and/or kV according to patient size and/or use of iterative reconstruction technique. CONTRAST:  OMNIPAQUE IOHEXOL 350 MG/ML SOLN COMPARISON:  04/25/2019 FINDINGS: CTA CHEST FINDINGS Cardiovascular: There is no demonstrable mural hematoma in thoracic aorta in noncontrast images. Coronary artery calcifications are seen. There are scattered calcifications in thoracic aorta. There is homogeneous enhancement  in thoracic aorta. There is no demonstrable intimal flap. Major branches of the thoracic aorta appear patent. Atherosclerotic plaques are noted in the proximal left subclavian artery with moderate narrowing of lumen. There are no intraluminal filling defects in pulmonary artery branches. Mediastinum/Nodes: No acute findings are seen. Lungs/Pleura: There is no focal pulmonary consolidation. There is no pleural effusion or pneumothorax. Musculoskeletal: No acute findings are seen. Review of the MIP images confirms the above findings. CTA ABDOMEN AND PELVIS FINDINGS VASCULAR Aorta: Atherosclerotic plaques and calcifications are seen. There is no demonstrable edema or flap. There is mild-to-moderate narrowing of lumen in infrarenal aorta. Celiac: Unremarkable. SMA: Unremarkable. Renals: There are 2 right renal arteries. There are scattered atherosclerotic plaques without high-grade stenosis. IMA: Patent. Inflow: Unremarkable. Veins: Unremarkable. Review of the MIP images confirms the above findings. NON-VASCULAR Hepatobiliary: There is fatty infiltration in liver. There is no dilation of bile ducts. Gallbladder is unremarkable. Pancreas: No focal abnormalities are seen. Spleen: Unremarkable. Adrenals/Urinary Tract: Adrenals are unremarkable. There is no hydronephrosis. There is 3 mm left renal calculus. Ureters are not dilated. Urinary bladder is not distended. Stomach/Bowel: Stomach is moderately distended. Small bowel loops are unremarkable. Appendix is not seen. There is no pericecal inflammation. There is no significant wall thickening in colon. Lymphatic: Unremarkable. Reproductive: Uterus is not seen.  Cervix is unremarkable. Other: There is no ascites or pneumoperitoneum. There is previous ventral hernia repair. Musculoskeletal: No acute findings are seen. Review of the MIP images confirms the above findings. IMPRESSION: There is no evidence of dissection in thoracic aorta and abdominal aorta. Major branches of  thoracic and abdominal aorta are patent. Coronary artery disease. Atherosclerotic plaques and calcifications are seen in thoracic aorta and abdominal aorta. There is mild narrowing of the lumen in infrarenal aorta. There is moderate narrowing of proximal course of the left subclavian artery. There is no evidence of pulmonary artery embolism. There is no focal pulmonary consolidation. There is no evidence of  intestinal obstruction or pneumoperitoneum. There is no hydronephrosis. Fatty liver.  Left renal calculus. Other findings as described in the body of the report. Electronically Signed   By: Ernie Avena M.D.   On: 12/27/2022 18:14   DG Chest Portable 1 View  Result Date: 12/27/2022 CLINICAL DATA:  Chest pain and near syncope. EXAM: PORTABLE CHEST 1 VIEW COMPARISON:  April 06, 2020 FINDINGS: The heart size and mediastinal contours are within normal limits. Both lungs are clear. The visualized skeletal structures are unremarkable. IMPRESSION: No active disease. Electronically Signed   By: Aram Candela M.D.   On: 12/27/2022 16:44    Pending Labs Unresulted Labs (From admission, onward)     Start     Ordered   12/29/22 0500  Comprehensive metabolic panel  Tomorrow morning,   R        12/28/22 0707   12/29/22 0500  CBC  Tomorrow morning,   R        12/28/22 0707   12/29/22 0500  Magnesium  Tomorrow morning,   R        12/28/22 0707   12/28/22 0453  LDL cholesterol, direct  Once,   AD        12/28/22 0453            Vitals/Pain Today's Vitals   12/28/22 0600 12/28/22 0623 12/28/22 0630 12/28/22 0728  BP: 114/70  110/62 117/64  Pulse: 63  (!) 58 61  Resp: 11  12 15   Temp:  (!) 97.5 F (36.4 C)  97.8 F (36.6 C)  TempSrc:  Oral  Oral  SpO2: 96%  98% 99%  Weight:      Height:      PainSc:        Isolation Precautions No active isolations  Medications Medications  heparin injection 5,000 Units (5,000 Units Subcutaneous Given 12/28/22 0611)  sodium chloride flush  (NS) 0.9 % injection 3 mL (3 mLs Intravenous Given 12/28/22 1034)  acetaminophen (TYLENOL) tablet 650 mg (has no administration in time range)    Or  acetaminophen (TYLENOL) suppository 650 mg (has no administration in time range)  lactated ringers infusion (0 mLs Intravenous Stopped 12/28/22 1034)  oxyCODONE (Oxy IR/ROXICODONE) immediate release tablet 5 mg (5 mg Oral Given During Downtime 12/28/22 0317)  ondansetron (ZOFRAN) tablet 4 mg (has no administration in time range)    Or  ondansetron (ZOFRAN) injection 4 mg (has no administration in time range)  senna-docusate (Senokot-S) tablet 1 tablet (has no administration in time range)  atorvastatin (LIPITOR) tablet 40 mg (40 mg Oral Given 12/28/22 1033)  buPROPion (WELLBUTRIN XL) 24 hr tablet 450 mg (450 mg Oral Given 12/28/22 1033)  topiramate (TOPAMAX) tablet 50 mg (50 mg Oral Given 12/28/22 1033)  insulin aspart (novoLOG) injection 0-9 Units (2 Units Subcutaneous Given 12/28/22 0900)  insulin aspart (novoLOG) injection 0-5 Units (2 Units Subcutaneous Given 12/27/22 2155)  albuterol (PROVENTIL) (2.5 MG/3ML) 0.083% nebulizer solution 2.5 mg (has no administration in time range)  sodium chloride 0.9 % bolus 1,000 mL (0 mLs Intravenous Stopped 12/27/22 1813)  iohexol (OMNIPAQUE) 350 MG/ML injection 100 mL (100 mLs Intravenous Contrast Given 12/27/22 1755)    Mobility walks

## 2022-12-29 ENCOUNTER — Inpatient Hospital Stay (HOSPITAL_COMMUNITY): Payer: 59

## 2022-12-29 DIAGNOSIS — R079 Chest pain, unspecified: Secondary | ICD-10-CM

## 2022-12-29 DIAGNOSIS — I200 Unstable angina: Secondary | ICD-10-CM | POA: Diagnosis not present

## 2022-12-29 DIAGNOSIS — N179 Acute kidney failure, unspecified: Secondary | ICD-10-CM | POA: Diagnosis not present

## 2022-12-29 DIAGNOSIS — E861 Hypovolemia: Secondary | ICD-10-CM | POA: Diagnosis not present

## 2022-12-29 LAB — CBC
HCT: 32.9 % — ABNORMAL LOW (ref 36.0–46.0)
Hemoglobin: 10.7 g/dL — ABNORMAL LOW (ref 12.0–15.0)
MCH: 29.5 pg (ref 26.0–34.0)
MCHC: 32.5 g/dL (ref 30.0–36.0)
MCV: 90.6 fL (ref 80.0–100.0)
Platelets: 175 10*3/uL (ref 150–400)
RBC: 3.63 MIL/uL — ABNORMAL LOW (ref 3.87–5.11)
RDW: 13.7 % (ref 11.5–15.5)
WBC: 7.5 10*3/uL (ref 4.0–10.5)
nRBC: 0 % (ref 0.0–0.2)

## 2022-12-29 LAB — COMPREHENSIVE METABOLIC PANEL
ALT: 23 U/L (ref 0–44)
AST: 19 U/L (ref 15–41)
Albumin: 3.3 g/dL — ABNORMAL LOW (ref 3.5–5.0)
Alkaline Phosphatase: 41 U/L (ref 38–126)
Anion gap: 8 (ref 5–15)
BUN: 12 mg/dL (ref 8–23)
CO2: 22 mmol/L (ref 22–32)
Calcium: 9 mg/dL (ref 8.9–10.3)
Chloride: 107 mmol/L (ref 98–111)
Creatinine, Ser: 0.88 mg/dL (ref 0.44–1.00)
GFR, Estimated: >60 mL/min (ref >60)
Glucose, Bld: 213 mg/dL — ABNORMAL HIGH (ref 70–99)
Potassium: 4.1 mmol/L (ref 3.5–5.1)
Sodium: 137 mmol/L (ref 135–145)
Total Bilirubin: 0.4 mg/dL (ref 0.3–1.2)
Total Protein: 5.8 g/dL — ABNORMAL LOW (ref 6.5–8.1)

## 2022-12-29 LAB — GLUCOSE, CAPILLARY
Glucose-Capillary: 248 mg/dL — ABNORMAL HIGH (ref 70–99)
Glucose-Capillary: 196 mg/dL — ABNORMAL HIGH (ref 70–99)
Glucose-Capillary: 172 mg/dL — ABNORMAL HIGH (ref 70–99)
Glucose-Capillary: 169 mg/dL — ABNORMAL HIGH (ref 70–99)
Glucose-Capillary: 239 mg/dL — ABNORMAL HIGH (ref 70–99)
Glucose-Capillary: 235 mg/dL — ABNORMAL HIGH (ref 70–99)

## 2022-12-29 LAB — MAGNESIUM: Magnesium: 1.3 mg/dL — ABNORMAL LOW (ref 1.7–2.4)

## 2022-12-29 LAB — ECHOCARDIOGRAM COMPLETE
AR max vel: 2.06 cm2
AV Area VTI: 2.1 cm2
AV Area mean vel: 2.09 cm2
AV Mean grad: 10.5 mmHg
AV Peak grad: 19.9 mmHg
Ao pk vel: 2.23 m/s
Area-P 1/2: 3.08 cm2
Height: 64 in
S' Lateral: 3 cm
Weight: 3236.8 oz

## 2022-12-29 LAB — HEPARIN LEVEL (UNFRACTIONATED)
Heparin Unfractionated: 0.11 IU/mL — ABNORMAL LOW (ref 0.30–0.70)
Heparin Unfractionated: 0.28 IU/mL — ABNORMAL LOW (ref 0.30–0.70)

## 2022-12-29 MED ORDER — MAGNESIUM SULFATE 4 GM/100ML IV SOLN
2.0000 g | Freq: Once | INTRAVENOUS | Status: AC
  Administered 2022-12-29: 2 g via INTRAVENOUS
  Filled 2022-12-29: qty 100

## 2022-12-29 NOTE — Progress Notes (Signed)
ANTICOAGULATION CONSULT NOTE  Pharmacy Consult for Heparin Indication:  severe multivessel CAD  Allergies  Allergen Reactions   Aspirin Anaphylaxis   Bee Venom Anaphylaxis   Benadryl [Diphenhydramine Hcl] Anaphylaxis   Morphine And Codeine Shortness Of Breath and Itching   Vicks Formula 44 Cough-Cold Pm [Dm-Apap-Cpm] Shortness Of Breath and Rash    Not anaphylaxis    Patient Measurements: Height: 5\' 4"  (162.6 cm) Weight: 91.8 kg (202 lb 4.8 oz) IBW/kg (Calculated) : 54.7 Heparin Dosing Weight: 75 kg  Vital Signs: Temp: 98 F (36.7 C) (07/20 0754) Temp Source: Oral (07/20 0754) BP: 117/66 (07/20 0754) Pulse Rate: 72 (07/20 0754)  Labs: Recent Labs    12/27/22 1618 12/27/22 1635 12/27/22 1810 12/28/22 0453 12/29/22 0516 12/29/22 0656  HGB 11.8* 11.6*  --  10.4* 10.7*  --   HCT 36.4 34.0*  --  32.0* 32.9*  --   PLT 204  --   --  191 175  --   HEPARINUNFRC  --   --   --   --   --  0.11*  CREATININE 1.19* 1.20*  --  0.96 0.88  --   TROPONINIHS 38*  --  34*  --   --   --     Estimated Creatinine Clearance: 72.7 mL/min (by C-G formula based on SCr of 0.88 mg/dL).   Medical History: Past Medical History:  Diagnosis Date   Abdominal discomfort    Chronic abdominal and pelvic pain resulting in significant loss of time from work   Anxiety    with depression   Asthma    Chest pain    Depression    Diabetes mellitus    Drug overdose 06/12/2007   60 Naprosyn, psychiatric admission   Hyperlipidemia    Severe   Hypertension    Migraines    Tremor of both hands    on propranolol    Medications:  Medications Prior to Admission  Medication Sig Dispense Refill Last Dose   albuterol (VENTOLIN HFA) 108 (90 Base) MCG/ACT inhaler Inhale 2 puffs into the lungs every 4 (four) hours as needed for wheezing or shortness of breath. (NEEDS TO BE SEEN) 18 g 1 unk   atorvastatin (LIPITOR) 40 MG tablet Take 40 mg by mouth daily.   12/27/2022   benazepril (LOTENSIN) 20 MG tablet  Take 1 tablet (20 mg total) by mouth daily. 90 tablet 1 12/27/2022   buPROPion (WELLBUTRIN XL) 150 MG 24 hr tablet TAKE 3 TABLETS BY MOUTH DAILY. 270 tablet 0 12/27/2022   EPINEPHrine 0.3 mg/0.3 mL IJ SOAJ injection Inject 0.3 mg into the muscle as needed for anaphylaxis. 2 each 2 unk   fenofibrate 160 MG tablet Take 1 tablet (160 mg total) by mouth daily. 90 tablet 1 12/27/2022   glipiZIDE (GLUCOTROL XL) 10 MG 24 hr tablet Take 1 tablet (10 mg total) by mouth daily. 180 tablet 1 12/26/2022   lisinopril (ZESTRIL) 10 MG tablet Take 1 tablet (10 mg total) by mouth daily. 90 tablet 1 12/26/2022   metFORMIN (GLUCOPHAGE) 500 MG tablet Take 1 tablet (500 mg total) by mouth 2 (two) times daily with a meal. 180 tablet 1 12/27/2022 at am   propranolol (INDERAL) 40 MG tablet Take 1 tablet (40 mg total) by mouth 2 (two) times daily. (Needs to be seen before next refill) 180 tablet 1 Past Week   rizatriptan (MAXALT) 10 MG tablet Take 1 tablet (10 mg total) by mouth as needed for migraine. May repeat in 2  hours if needed 10 tablet 2 unk   RYBELSUS 7 MG TABS TAKE 1 TABLET (7 MG TOTAL) BY MOUTH DAILY 90 tablet 1 12/27/2022   topiramate (TOPAMAX) 50 MG tablet Take 1 tablet (50 mg total) by mouth 2 (two) times daily. 180 tablet 1 12/27/2022    Assessment: 62 y.o. F presented with chest pain. Found to have severe multivessel disease on cath. Heparin was started 2 hours post TR band removal. TR band removed 7/19 2100, for CVTS consult.   On 7/20 at 0700 heparin level was 0.11 on 1150 units/hr, subtherapeutic. Heparin running okay overnight and no s/sx of bleeding per RN.    Goal of Therapy:  Heparin level 0.3-0.7 units/ml Monitor platelets by anticoagulation protocol: Yes   Plan:  Increase heparin gtt at 1400 units/hr. No bolus. Will f/u heparin level in 6 hours Daily heparin level and CBC; monitor for s/sx of bleeding  Enos Fling, PharmD PGY-1 Acute Care Pharmacy Resident 12/29/2022 8:24 AM

## 2022-12-29 NOTE — Progress Notes (Signed)
*  PRELIMINARY RESULTS* Echocardiogram 2D Echocardiogram has been performed.  [name] 12/29/2022, 8:49 AM

## 2022-12-29 NOTE — Progress Notes (Signed)
PROGRESS NOTE  [name] VWU:[id number] DOB: Sep 04, 1960 DOA: 12/27/2022 PCP: Bennie Pierini, FNP   LOS: 2 days   Brief Narrative / Interim history: 62 y.o. female with medical history significant for T2DM, HTN, HLD, depression/anxiety who presented to the ED for evaluation of chest pain and near syncopal episode. Patient states that for the last week she has been having intermittent chest pain.  This is described as pressure-like discomfort across her chest, to her back, and down her left arm.  This is worse with exertion.  She says normally walks her dogs 1.5 miles however since symptoms started she can barely walk a block before her symptoms occur.  Symptoms are associated with shortness of breath.  She has had the symptoms occasionally while at rest as well. She also reports nausea. She was seen by [facility] cardiology 12/24/2022 who recommended outpatient stress testing and echocardiogram, not yet performed. On the day of admission she was at a restaurant with friends when she suddenly became diaphoretic and nauseous while she was sitting down.   Subjective / 24h Interval events: No acute issues or events overnight, denies any further episodes of nausea vomiting diarrhea constipation any fevers chills chest pain or shortness of breath  Assesement and Plan: Principal Problem:   Chest pain Active Problems:   Hypotension   AKI (acute kidney injury) (HCC)   Type 2 diabetes mellitus (HCC)   Hyperlipidemia associated with type 2 diabetes mellitus (HCC)   Chest pain in the setting of unstable angina, severe multivessel coronary artery disease -Cardiology following -Cardiac cath 7/19 shows severe multivessel disease LVEDP 13 mmHg -Continue heparin drip, discussed with cardiothoracic surgery for CABG  Hypotension with history of hypertension -Questionably secondary to above   Acute kidney injury, resolving -Likely secondary to hypotension as above   Type 2 diabetes, un  controlled, with hyperglycemia  -Uncontrolled with A1c 9.1 -Continue sliding scale insulin, hypoglycemic protocol diabetic diet -Hold home glycemic medications   Hyperlipidemia - Continue atorvastatin. Lipid panel shows poorly controled profile   Depression/anxiety - Continue Wellbutrin.   Code Status: DNR  Family Communication: no family at bedside   Status is: Inpatient  Remains inpatient appropriate because: severity of illness  Level of care: Progressive  Consultants:  Cardiology   Objective: Vitals:   12/29/22 0330 12/29/22 0609 12/29/22 0618 12/29/22 0754  BP: (!) 118/54 (!) 128/58 131/70 117/66  Pulse: 74 (!) 58  72  Resp: 20   18  Temp: 98.3 F (36.8 C)   98 F (36.7 C)  TempSrc: Oral   Oral  SpO2: 100% 100%  100%  Weight: 91.8 kg     Height:        Intake/Output Summary (Last 24 hours) at 12/29/2022 0810 Last data filed at 12/28/2022 2200 Gross per 24 hour  Intake 559.96 ml  Output --  Net 559.96 ml   Wt Readings from Last 3 Encounters:  12/29/22 91.8 kg  11/26/22 93 kg  08/21/22 91.2 kg    Examination:  Constitutional: NAD Eyes: no scleral icterus ENMT: Mucous membranes are moist.  Neck: normal, supple Respiratory: clear to auscultation bilaterally, no wheezing, no crackles.  Cardiovascular: Regular rate and rhythm, no murmurs / rubs / gallops.  Abdomen: non distended, no tenderness. Bowel sounds positive.  Musculoskeletal: no clubbing / cyanosis.   Data Reviewed: I have independently reviewed following labs and imaging studies   CBC Recent Labs  Lab 12/27/22 1618 12/27/22 1635 12/28/22 0453 12/29/22 0516  WBC 10.5  --  8.6 7.5  HGB 11.8* 11.6* 10.4* 10.7*  HCT 36.4 34.0* 32.0* 32.9*  PLT 204  --  191 175  MCV 93.1  --  92.8 90.6  MCH 30.2  --  30.1 29.5  MCHC 32.4  --  32.5 32.5  RDW 13.6  --  13.8 13.7  LYMPHSABS 2.0  --   --   --   MONOABS 0.6  --   --   --   EOSABS 0.1  --   --   --   BASOSABS 0.1  --   --   --     Recent  Labs  Lab 12/27/22 1618 12/27/22 1635 12/28/22 0453 12/29/22 0516  NA 135 138 134* 137  K 3.8 3.9 4.0 4.1  CL 108 110 108 107  CO2 17*  --  16* 22  GLUCOSE 294* 296* 262* 213*  BUN 27* 25* 23 12  CREATININE 1.19* 1.20* 0.96 0.88  CALCIUM 8.8*  --  8.9 9.0  AST  --   --   --  19  ALT  --   --   --  23  ALKPHOS  --   --   --  41  BILITOT  --   --   --  0.4  ALBUMIN  --   --   --  3.3*  MG  --   --   --  1.3*  DDIMER <0.27  --   --   --   BNP 46.8  --   --   --     ------------------------------------------------------------------------------------------------------------------ Recent Labs    12/28/22 0453  CHOL 261*  HDL 38*  LDLCALC UNABLE TO CALCULATE IF TRIGLYCERIDE OVER 400 mg/dL  TRIG 259*  CHOLHDL 6.9  LDLDIRECT 136*    Lab Results  Component Value Date   HGBA1C 9.1 (H) 11/26/2022   ------------------------------------------------------------------------------------------------------------------ No results for input(s): "TSH", "T4TOTAL", "T3FREE", "THYROIDAB" in the last 72 hours.  Invalid input(s): "FREET3"  Cardiac Enzymes No results for input(s): "CKMB", "TROPONINI", "MYOGLOBIN" in the last 168 hours.  Invalid input(s): "CK" ------------------------------------------------------------------------------------------------------------------    Component Value Date/Time   BNP 46.8 12/27/2022 1618    CBG: Recent Labs  Lab 12/28/22 0831 12/28/22 1252 12/28/22 1753 12/28/22 2156 12/29/22 0753  GLUCAP 217* 173* 137* 197* 248*    Recent Results (from the past 240 hour(s))  SARS Coronavirus 2 by RT PCR (hospital order, performed in [facility] hospital lab) *cepheid single result test* Anterior Nasal Swab     Status: None   Collection Time: 12/27/22  4:18 PM   Specimen: Anterior Nasal Swab  Result Value Ref Range Status   SARS Coronavirus 2 by RT PCR NEGATIVE NEGATIVE Final    Comment: (NOTE) SARS-CoV-2 target nucleic acids are NOT  DETECTED.  The SARS-CoV-2 RNA is generally detectable in upper and lower respiratory specimens during the acute phase of infection. The lowest concentration of SARS-CoV-2 viral copies this assay can detect is 250 copies / mL. A negative result does not preclude SARS-CoV-2 infection and should not be used as the sole basis for treatment or other patient management decisions.  A negative result may occur with improper specimen collection / handling, submission of specimen other than nasopharyngeal swab, presence of viral mutation(s) within the areas targeted by this assay, and inadequate number of viral copies (<250 copies / mL). A negative result must be combined with clinical observations, patient history, and epidemiological information.  Fact Sheet for Patients:   RoadLapTop.co.za  Fact Sheet  for Healthcare Providers: http://kim-miller.com/  This test is not yet approved or  cleared by the [place] and has been authorized for detection and/or diagnosis of SARS-CoV-2 by FDA under an Emergency Use Authorization (EUA).  This EUA will remain in effect (meaning this test can be used) for the duration of the COVID-19 declaration under Section 564(b)(1) of the Act, 21 U.S.C. section 360bbb-3(b)(1), unless the authorization is terminated or revoked sooner.  Performed at [facility], [address], [place], [place] [address]      Radiology Studies: CARDIAC CATHETERIZATION  Result Date: 12/28/2022 1.  Severe multivessel disease. 2.  LVEDP of 13 mmHg Recommendation: Will start heparin infusion 2 hours after TR band is removed; obtain cardiothoracic surgical evaluation.    Carma Leaven DO [facility]  Between 7 pm - 7 am I am not available, please contact night coverage MD/APP via Amion

## 2022-12-29 NOTE — Progress Notes (Signed)
At 21:50 patient complained of 9/10 chest pain. EKG done, 2 L of oxygen applied  and cardiologist ([place]) contacted. Three does of PRN nitroglycerin were administered with pain level starting at 7 and ending at 4. Provider also put in an order for continuous nitroglycerin.   Contacted pharmacy at [phone] for infiltration R IV site, which was running heparin and nitroglycerin. Site is a little puffy, red, and painful. Patient declined pain meds.Pharmacist Chanetta Marshall) said a cold pack at site was an appropriate intervention.

## 2022-12-29 NOTE — Progress Notes (Signed)
ANTICOAGULATION CONSULT NOTE  Pharmacy Consult for Heparin Indication:  severe multivessel CAD  Allergies  Allergen Reactions   Aspirin Anaphylaxis   Bee Venom Anaphylaxis   Benadryl [Diphenhydramine Hcl] Anaphylaxis   Morphine And Codeine Shortness Of Breath and Itching   Vicks Formula 44 Cough-Cold Pm [Dm-Apap-Cpm] Shortness Of Breath and Rash    Not anaphylaxis    Patient Measurements: Height: 5\' 4"  (162.6 cm) Weight: 91.8 kg (202 lb 4.8 oz) IBW/kg (Calculated) : 54.7 Heparin Dosing Weight: 75 kg  Vital Signs: Temp: 98.2 F (36.8 C) (07/20 1210) Temp Source: Oral (07/20 1210) BP: 146/72 (07/20 1210) Pulse Rate: 74 (07/20 1210)  Labs: Recent Labs    12/27/22 1618 12/27/22 1635 12/27/22 1810 12/28/22 0453 12/29/22 0516 12/29/22 0656 12/29/22 1423  HGB 11.8* 11.6*  --  10.4* 10.7*  --   --   HCT 36.4 34.0*  --  32.0* 32.9*  --   --   PLT 204  --   --  191 175  --   --   HEPARINUNFRC  --   --   --   --   --  0.11* 0.28*  CREATININE 1.19* 1.20*  --  0.96 0.88  --   --   TROPONINIHS 38*  --  34*  --   --   --   --     Estimated Creatinine Clearance: 72.7 mL/min (by C-G formula based on SCr of 0.88 mg/dL).   Medical History: Past Medical History:  Diagnosis Date   Abdominal discomfort    Chronic abdominal and pelvic pain resulting in significant loss of time from work   Anxiety    with depression   Asthma    Chest pain    Depression    Diabetes mellitus    Drug overdose 06/12/2007   60 Naprosyn, psychiatric admission   Hyperlipidemia    Severe   Hypertension    Migraines    Tremor of both hands    on propranolol    Medications:  Medications Prior to Admission  Medication Sig Dispense Refill Last Dose   albuterol (VENTOLIN HFA) 108 (90 Base) MCG/ACT inhaler Inhale 2 puffs into the lungs every 4 (four) hours as needed for wheezing or shortness of breath. (NEEDS TO BE SEEN) 18 g 1 unk   atorvastatin (LIPITOR) 40 MG tablet Take 40 mg by mouth daily.    12/27/2022   benazepril (LOTENSIN) 20 MG tablet Take 1 tablet (20 mg total) by mouth daily. 90 tablet 1 12/27/2022   buPROPion (WELLBUTRIN XL) 150 MG 24 hr tablet TAKE 3 TABLETS BY MOUTH DAILY. 270 tablet 0 12/27/2022   EPINEPHrine 0.3 mg/0.3 mL IJ SOAJ injection Inject 0.3 mg into the muscle as needed for anaphylaxis. 2 each 2 unk   fenofibrate 160 MG tablet Take 1 tablet (160 mg total) by mouth daily. 90 tablet 1 12/27/2022   glipiZIDE (GLUCOTROL XL) 10 MG 24 hr tablet Take 1 tablet (10 mg total) by mouth daily. 180 tablet 1 12/26/2022   lisinopril (ZESTRIL) 10 MG tablet Take 1 tablet (10 mg total) by mouth daily. 90 tablet 1 12/26/2022   metFORMIN (GLUCOPHAGE) 500 MG tablet Take 1 tablet (500 mg total) by mouth 2 (two) times daily with a meal. 180 tablet 1 12/27/2022 at am   propranolol (INDERAL) 40 MG tablet Take 1 tablet (40 mg total) by mouth 2 (two) times daily. (Needs to be seen before next refill) 180 tablet 1 Past Week   rizatriptan (MAXALT) 10  MG tablet Take 1 tablet (10 mg total) by mouth as needed for migraine. May repeat in 2 hours if needed 10 tablet 2 unk   RYBELSUS 7 MG TABS TAKE 1 TABLET (7 MG TOTAL) BY MOUTH DAILY 90 tablet 1 12/27/2022   topiramate (TOPAMAX) 50 MG tablet Take 1 tablet (50 mg total) by mouth 2 (two) times daily. 180 tablet 1 12/27/2022    Assessment: 62 y.o. F presented with chest pain. Found to have severe multivessel disease on cath. Heparin was started 2 hours post TR band removal. TR band removed 7/19 2100, for CVTS consult.   On 7/20 at 1430 heparin level was 0.28 on 1400 units/hr, subtherapeutic. No issues with the heparin infusion running per RN.    Goal of Therapy:  Heparin level 0.3-0.7 units/ml Monitor platelets by anticoagulation protocol: Yes   Plan:  Increase heparin gtt to 1500 units/hr. No bolus. Will f/u heparin level with AM labs Daily heparin level and CBC; monitor for s/sx of bleeding  Enos Fling, PharmD PGY-1 Acute Care Pharmacy  Resident 12/29/2022 3:44 PM

## 2022-12-29 NOTE — Plan of Care (Signed)
  Problem: Coping: Goal: Ability to adjust to condition or change in health will improve Outcome: Progressing   Problem: Fluid Volume: Goal: Ability to maintain a balanced intake and output will improve Outcome: Progressing   Problem: Health Behavior/Discharge Planning: Goal: Ability to identify and utilize available resources and services will improve Outcome: Progressing   Problem: Health Behavior/Discharge Planning: Goal: Ability to manage health-related needs will improve Outcome: Progressing   Problem: Metabolic: Goal: Ability to maintain appropriate glucose levels will improve Outcome: Progressing   Problem: Nutritional: Goal: Maintenance of adequate nutrition will improve Outcome: Progressing   Problem: Nutritional: Goal: Progress toward achieving an optimal weight will improve Outcome: Progressing   Problem: Tissue Perfusion: Goal: Adequacy of tissue perfusion will improve Outcome: Progressing   Problem: Education: Goal: Knowledge of General Education information will improve Description: Including pain rating scale, medication(s)/side effects and non-pharmacologic comfort measures Outcome: Progressing   Problem: Health Behavior/Discharge Planning: Goal: Ability to manage health-related needs will improve Outcome: Progressing   Problem: Clinical Measurements: Goal: Ability to maintain clinical measurements within normal limits will improve Outcome: Progressing   Problem: Clinical Measurements: Goal: Will remain free from infection Outcome: Progressing   Problem: Activity: Goal: Risk for activity intolerance will decrease Outcome: Progressing   Problem: Clinical Measurements: Goal: Cardiovascular complication will be avoided Outcome: Progressing   Problem: Nutrition: Goal: Adequate nutrition will be maintained Outcome: Progressing   Problem: Coping: Goal: Level of anxiety will decrease Outcome: Progressing   Problem: Pain Managment: Goal:  General experience of comfort will improve Outcome: Progressing   Problem: Safety: Goal: Ability to remain free from injury will improve Outcome: Progressing   Problem: Education: Goal: Understanding of CV disease, CV risk reduction, and recovery process will improve Outcome: Progressing   Problem: Activity: Goal: Ability to return to baseline activity level will improve Outcome: Progressing   Problem: Cardiovascular: Goal: Ability to achieve and maintain adequate cardiovascular perfusion will improve Outcome: Progressing   Problem: Cardiovascular: Goal: Vascular access site(s) Level 0-1 will be maintained Outcome: Progressing   Problem: Health Behavior/Discharge Planning: Goal: Ability to safely manage health-related needs after discharge will improve Outcome: Progressing

## 2022-12-29 NOTE — Plan of Care (Signed)
  Problem: Coping: Goal: Ability to adjust to condition or change in health will improve Outcome: Progressing   Problem: Fluid Volume: Goal: Ability to maintain a balanced intake and output will improve Outcome: Progressing   Problem: Nutritional: Goal: Maintenance of adequate nutrition will improve Outcome: Progressing   Problem: Skin Integrity: Goal: Risk for impaired skin integrity will decrease Outcome: Progressing   Problem: Tissue Perfusion: Goal: Adequacy of tissue perfusion will improve Outcome: Progressing   Problem: Education: Goal: Knowledge of General Education information will improve Description: Including pain rating scale, medication(s)/side effects and non-pharmacologic comfort measures Outcome: Progressing

## 2022-12-29 NOTE — Progress Notes (Signed)
Progress Note  Patient Name: [name] Date of Encounter: 12/29/2022  Primary Cardiologist: None   Subjective   No chest pain or sob.   Inpatient Medications    Scheduled Meds:  atorvastatin  40 mg Oral Daily   buPROPion  450 mg Oral Daily   insulin aspart  0-5 Units Subcutaneous QHS   insulin aspart  0-9 Units Subcutaneous TID WC   sodium chloride flush  3 mL Intravenous Q12H   sodium chloride flush  3 mL Intravenous Q12H   topiramate  50 mg Oral BID   Continuous Infusions:  sodium chloride     heparin 1,400 Units/hr (12/29/22 0914)   nitroGLYCERIN 10 mcg/min (12/29/22 0719)   PRN Meds: sodium chloride, acetaminophen **OR** acetaminophen, albuterol, ondansetron **OR** ondansetron (ZOFRAN) IV, ondansetron (ZOFRAN) IV, oxyCODONE, senna-docusate, sodium chloride flush   Vital Signs    Vitals:   12/29/22 0330 12/29/22 0609 12/29/22 0618 12/29/22 0754  BP: (!) 118/54 (!) 128/58 131/70 117/66  Pulse: 74 (!) 58  72  Resp: 20   18  Temp: 98.3 F (36.8 C)   98 F (36.7 C)  TempSrc: Oral   Oral  SpO2: 100% 100%  100%  Weight: 91.8 kg     Height:        Intake/Output Summary (Last 24 hours) at 12/29/2022 1158 Last data filed at 12/28/2022 2200 Gross per 24 hour  Intake 559.96 ml  Output --  Net 559.96 ml   Filed Weights   12/27/22 1611 12/29/22 0330  Weight: 91.2 kg 91.8 kg    Telemetry    nsr - Personally Reviewed  ECG    nsr - Personally Reviewed  Physical Exam   GEN: morbidly obese, acute distress.   Neck: No JVD Cardiac: RRR, no murmurs, rubs, or gallops.  Respiratory: Clear to auscultation bilaterally. GI: Soft, nontender, non-distended  MS: No edema; No deformity. Neuro:  Nonfocal  Psych: Normal affect   Labs    Chemistry Recent Labs  Lab 12/27/22 1618 12/27/22 1635 12/28/22 0453 12/29/22 0516  NA 135 138 134* 137  K 3.8 3.9 4.0 4.1  CL 108 110 108 107  CO2 17*  --  16* 22  GLUCOSE 294* 296* 262* 213*  BUN 27* 25* 23  12  CREATININE 1.19* 1.20* 0.96 0.88  CALCIUM 8.8*  --  8.9 9.0  PROT  --   --   --  5.8*  ALBUMIN  --   --   --  3.3*  AST  --   --   --  19  ALT  --   --   --  23  ALKPHOS  --   --   --  41  BILITOT  --   --   --  0.4  GFRNONAA 52*  --  >60 >60  ANIONGAP 10  --  10 8     Hematology Recent Labs  Lab 12/27/22 1618 12/27/22 1635 12/28/22 0453 12/29/22 0516  WBC 10.5  --  8.6 7.5  RBC 3.91  --  3.45* 3.63*  HGB 11.8* 11.6* 10.4* 10.7*  HCT 36.4 34.0* 32.0* 32.9*  MCV 93.1  --  92.8 90.6  MCH 30.2  --  30.1 29.5  MCHC 32.4  --  32.5 32.5  RDW 13.6  --  13.8 13.7  PLT 204  --  191 175    Cardiac EnzymesNo results for input(s): "TROPONINI" in the last 168 hours. No results for input(s): "TROPIPOC" in the last  168 hours.   BNP Recent Labs  Lab 12/27/22 1618  BNP 46.8     DDimer  Recent Labs  Lab 12/27/22 1618  DDIMER <0.27     Radiology    ECHOCARDIOGRAM COMPLETE  Result Date: 12/29/2022    ECHOCARDIOGRAM REPORT   Patient Name:   [name] Date of Exam: 12/29/2022 Medical Rec #:  [id number]            Height:       64.0 in Accession #:    [id number]           Weight:       202.3 lb Date of Birth:  1961/05/02            BSA:          1.966 m Patient Age:    62 years             BP:           117/66 mmHg Patient Gender: F                    HR:           70 bpm. Exam Location:  Inpatient Procedure: 2D Echo, Cardiac Doppler and Color Doppler Indications:    Chest Pain R07.9  History:        Patient has prior history of Echocardiogram examinations. CAD;                 Risk Factors:Hypertension, Diabetes, Dyslipidemia and                 Non-Smoker.  Sonographer:    Dondra Prader RVT RCS Referring Phys: [id number] [facility] A CHANDRASEKHAR IMPRESSIONS  1. Left ventricular ejection fraction, by estimation, is 60 to 65%. The left ventricle has normal function. The left ventricle has no regional wall motion abnormalities. Left ventricular diastolic parameters were normal.  2.  Right ventricular systolic function is normal. The right ventricular size is normal.  3. Left atrial size was mildly dilated.  4. Trivial mitral valve regurgitation.  5. The aortic valve is tricuspid. Aortic valve regurgitation is not visualized. Aortic valve sclerosis is present, with no evidence of aortic valve stenosis.  6. The inferior vena cava is normal in size with greater than 50% respiratory variability, suggesting right atrial pressure of 3 mmHg. FINDINGS  Left Ventricle: Left ventricular ejection fraction, by estimation, is 60 to 65%. The left ventricle has normal function. The left ventricle has no regional wall motion abnormalities. The left ventricular internal cavity size was normal in size. There is  no left ventricular hypertrophy. Left ventricular diastolic parameters were normal. Right Ventricle: The right ventricular size is normal. Right vetricular wall thickness was not assessed. Right ventricular systolic function is normal. Left Atrium: Left atrial size was mildly dilated. Right Atrium: Right atrial size was normal in size. Pericardium: There is no evidence of pericardial effusion. Mitral Valve: Mild mitral annular calcification. Trivial mitral valve regurgitation. Tricuspid Valve: The tricuspid valve is normal in structure. Tricuspid valve regurgitation is trivial. Aortic Valve: The aortic valve is tricuspid. Aortic valve regurgitation is not visualized. Aortic valve sclerosis is present, with no evidence of aortic valve stenosis. Aortic valve mean gradient measures 10.5 mmHg. Aortic valve peak gradient measures 19.9 mmHg. Aortic valve area, by VTI measures 2.10 cm. Pulmonic Valve: The pulmonic valve was not well visualized. Pulmonic valve regurgitation is not visualized. Aorta: The aortic root and ascending aorta are structurally normal,  with no evidence of dilitation. Venous: The inferior vena cava is normal in size with greater than 50% respiratory variability, suggesting right atrial  pressure of 3 mmHg. IAS/Shunts: No atrial level shunt detected by color flow Doppler.  LEFT VENTRICLE PLAX 2D LVIDd:         4.70 cm   Diastology LVIDs:         3.00 cm   LV e' medial:    7.72 cm/s LV PW:         1.00 cm   LV E/e' medial:  14.4 LV IVS:        0.90 cm   LV e' lateral:   10.80 cm/s LVOT diam:     2.00 cm   LV E/e' lateral: 10.3 LV SV:         94 LV SV Index:   48 LVOT Area:     3.14 cm  RIGHT VENTRICLE             IVC RV Basal diam:  2.65 cm     IVC diam: 1.90 cm RV Mid diam:    2.40 cm RV S prime:     15.90 cm/s TAPSE (M-mode): 2.2 cm LEFT ATRIUM             Index        RIGHT ATRIUM           Index LA diam:        3.70 cm 1.88 cm/m   RA Area:     10.20 cm LA Vol (A2C):   62.2 ml 31.64 ml/m  RA Volume:   21.50 ml  10.94 ml/m LA Vol (A4C):   67.6 ml 34.39 ml/m LA Biplane Vol: 65.8 ml 33.47 ml/m  AORTIC VALVE                     PULMONIC VALVE AV Area (Vmax):    2.06 cm      PV Vmax:       1.21 m/s AV Area (Vmean):   2.09 cm      PV Peak grad:  5.9 mmHg AV Area (VTI):     2.10 cm AV Vmax:           223.00 cm/s AV Vmean:          151.500 cm/s AV VTI:            0.448 m AV Peak Grad:      19.9 mmHg AV Mean Grad:      10.5 mmHg LVOT Vmax:         146.00 cm/s LVOT Vmean:        101.000 cm/s LVOT VTI:          0.300 m LVOT/AV VTI ratio: 0.67  AORTA Ao Root diam: 2.70 cm Ao Asc diam:  3.40 cm MITRAL VALVE MV Area (PHT): 3.08 cm     SHUNTS MV Decel Time: 246 msec     Systemic VTI:  0.30 m MV E velocity: 111.00 cm/s  Systemic Diam: 2.00 cm MV A velocity: 112.00 cm/s MV E/A ratio:  0.99 Dietrich Pates MD Electronically signed by Dietrich Pates MD Signature Date/Time: 12/29/2022/11:50:02 AM    Final    CARDIAC CATHETERIZATION  Result Date: 12/28/2022 1.  Severe multivessel disease. 2.  LVEDP of 13 mmHg Recommendation: Will start heparin infusion 2 hours after TR band is removed; obtain cardiothoracic surgical evaluation.   CT Angio Chest/Abd/Pel for Dissection W and/or Wo Contrast  Result Date:  12/27/2022 CLINICAL DATA:  Acute aortic syndrome suspected, chest pain EXAM: CT ANGIOGRAPHY CHEST, ABDOMEN AND PELVIS TECHNIQUE: Non-contrast CT of the chest was initially obtained. Multidetector CT imaging through the chest, abdomen and pelvis was performed using the standard protocol during bolus administration of intravenous contrast. Multiplanar reconstructed images and MIPs were obtained and reviewed to evaluate the vascular anatomy. RADIATION DOSE REDUCTION: This exam was performed according to the departmental dose-optimization program which includes automated exposure control, adjustment of the mA and/or kV according to patient size and/or use of iterative reconstruction technique. CONTRAST:  OMNIPAQUE IOHEXOL 350 MG/ML SOLN COMPARISON:  04/25/2019 FINDINGS: CTA CHEST FINDINGS Cardiovascular: There is no demonstrable mural hematoma in thoracic aorta in noncontrast images. Coronary artery calcifications are seen. There are scattered calcifications in thoracic aorta. There is homogeneous enhancement in thoracic aorta. There is no demonstrable intimal flap. Major branches of the thoracic aorta appear patent. Atherosclerotic plaques are noted in the proximal left subclavian artery with moderate narrowing of lumen. There are no intraluminal filling defects in pulmonary artery branches. Mediastinum/Nodes: No acute findings are seen. Lungs/Pleura: There is no focal pulmonary consolidation. There is no pleural effusion or pneumothorax. Musculoskeletal: No acute findings are seen. Review of the MIP images confirms the above findings. CTA ABDOMEN AND PELVIS FINDINGS VASCULAR Aorta: Atherosclerotic plaques and calcifications are seen. There is no demonstrable edema or flap. There is mild-to-moderate narrowing of lumen in infrarenal aorta. Celiac: Unremarkable. SMA: Unremarkable. Renals: There are 2 right renal arteries. There are scattered atherosclerotic plaques without high-grade stenosis. IMA: Patent. Inflow:  Unremarkable. Veins: Unremarkable. Review of the MIP images confirms the above findings. NON-VASCULAR Hepatobiliary: There is fatty infiltration in liver. There is no dilation of bile ducts. Gallbladder is unremarkable. Pancreas: No focal abnormalities are seen. Spleen: Unremarkable. Adrenals/Urinary Tract: Adrenals are unremarkable. There is no hydronephrosis. There is 3 mm left renal calculus. Ureters are not dilated. Urinary bladder is not distended. Stomach/Bowel: Stomach is moderately distended. Small bowel loops are unremarkable. Appendix is not seen. There is no pericecal inflammation. There is no significant wall thickening in colon. Lymphatic: Unremarkable. Reproductive: Uterus is not seen.  Cervix is unremarkable. Other: There is no ascites or pneumoperitoneum. There is previous ventral hernia repair. Musculoskeletal: No acute findings are seen. Review of the MIP images confirms the above findings. IMPRESSION: There is no evidence of dissection in thoracic aorta and abdominal aorta. Major branches of thoracic and abdominal aorta are patent. Coronary artery disease. Atherosclerotic plaques and calcifications are seen in thoracic aorta and abdominal aorta. There is mild narrowing of the lumen in infrarenal aorta. There is moderate narrowing of proximal course of the left subclavian artery. There is no evidence of pulmonary artery embolism. There is no focal pulmonary consolidation. There is no evidence of intestinal obstruction or pneumoperitoneum. There is no hydronephrosis. Fatty liver.  Left renal calculus. Other findings as described in the body of the report. Electronically Signed   By: Ernie Avena M.D.   On: 12/27/2022 18:14   DG Chest Portable 1 View  Result Date: 12/27/2022 CLINICAL DATA:  Chest pain and near syncope. EXAM: PORTABLE CHEST 1 VIEW COMPARISON:  April 06, 2020 FINDINGS: The heart size and mediastinal contours are within normal limits. Both lungs are clear. The visualized  skeletal structures are unremarkable. IMPRESSION: No active disease. Electronically Signed   By: Aram Candela M.D.   On: 12/27/2022 16:44    Cardiac Studies   See above  Patient Profile  62 y.o. female admitted with [place], s/p cath wit severe 3 V CAD. Her EF is normal as are her kidneys. We will ask CVTS to see.   Assessment & Plan    [place] - she is now pain free. She will be referred to CVTS due to three vessel CAD. Continue current medical therapy.     For questions or updates, please contact [facility] Please consult [email or web address] for contact info under Cardiology/STEMI.      Signed, Lewayne Bunting, MD  12/29/2022, 11:58 AM

## 2022-12-30 DIAGNOSIS — I25110 Atherosclerotic heart disease of native coronary artery with unstable angina pectoris: Secondary | ICD-10-CM

## 2022-12-30 DIAGNOSIS — I214 Non-ST elevation (NSTEMI) myocardial infarction: Secondary | ICD-10-CM | POA: Diagnosis not present

## 2022-12-30 LAB — GLUCOSE, CAPILLARY
Glucose-Capillary: 193 mg/dL — ABNORMAL HIGH (ref 70–99)
Glucose-Capillary: 243 mg/dL — ABNORMAL HIGH (ref 70–99)
Glucose-Capillary: 275 mg/dL — ABNORMAL HIGH (ref 70–99)
Glucose-Capillary: 217 mg/dL — ABNORMAL HIGH (ref 70–99)
Glucose-Capillary: 274 mg/dL — ABNORMAL HIGH (ref 70–99)

## 2022-12-30 LAB — CBC
HCT: 33.6 % — ABNORMAL LOW (ref 36.0–46.0)
Hemoglobin: 11.1 g/dL — ABNORMAL LOW (ref 12.0–15.0)
MCH: 29.5 pg (ref 26.0–34.0)
MCHC: 33 g/dL (ref 30.0–36.0)
MCV: 89.4 fL (ref 80.0–100.0)
Platelets: 189 10*3/uL (ref 150–400)
RBC: 3.76 MIL/uL — ABNORMAL LOW (ref 3.87–5.11)
RDW: 13.4 % (ref 11.5–15.5)
WBC: 7.9 10*3/uL (ref 4.0–10.5)
nRBC: 0 % (ref 0.0–0.2)

## 2022-12-30 LAB — BASIC METABOLIC PANEL
Anion gap: 12 (ref 5–15)
BUN: 15 mg/dL (ref 8–23)
CO2: 18 mmol/L — ABNORMAL LOW (ref 22–32)
Calcium: 9 mg/dL (ref 8.9–10.3)
Chloride: 105 mmol/L (ref 98–111)
Creatinine, Ser: 0.95 mg/dL (ref 0.44–1.00)
GFR, Estimated: >60 mL/min (ref >60)
Glucose, Bld: 210 mg/dL — ABNORMAL HIGH (ref 70–99)
Potassium: 3.7 mmol/L (ref 3.5–5.1)
Sodium: 135 mmol/L (ref 135–145)

## 2022-12-30 LAB — HEPARIN LEVEL (UNFRACTIONATED): Heparin Unfractionated: 0.51 IU/mL (ref 0.30–0.70)

## 2022-12-30 LAB — APTT
aPTT: 63 seconds — ABNORMAL HIGH (ref 24–36)
aPTT: 63 seconds — ABNORMAL HIGH (ref 24–36)
aPTT: 84 seconds — ABNORMAL HIGH (ref 24–36)

## 2022-12-30 LAB — MAGNESIUM: Magnesium: 1.9 mg/dL (ref 1.7–2.4)

## 2022-12-30 MED ORDER — HEPARIN (PORCINE) 25000 UT/250ML-% IV SOLN
1650.0000 [IU]/h | INTRAVENOUS | Status: DC
  Administered 2022-12-31 (×2): 1650 [IU]/h via INTRAVENOUS
  Filled 2022-12-30 (×2): qty 250

## 2022-12-30 NOTE — Progress Notes (Signed)
ANTICOAGULATION CONSULT NOTE  Pharmacy Consult for Heparin Indication:  severe multivessel CAD  Allergies  Allergen Reactions   Aspirin Anaphylaxis   Bee Venom Anaphylaxis   Benadryl [Diphenhydramine Hcl] Anaphylaxis   Morphine And Codeine Shortness Of Breath and Itching   Vicks Formula 44 Cough-Cold Pm [Dm-Apap-Cpm] Shortness Of Breath and Rash    Not anaphylaxis    Patient Measurements: Height: 5\' 4"  (162.6 cm) Weight: 90.9 kg (200 lb 4.8 oz) IBW/kg (Calculated) : 54.7 Heparin Dosing Weight: 75 kg  Vital Signs: Temp: 98.2 F (36.8 C) (07/21 1117) Temp Source: Oral (07/21 1117) BP: 125/75 (07/21 1251) Pulse Rate: 80 (07/21 1117)  Labs: Recent Labs    12/27/22 1618 12/27/22 1635 12/27/22 1810 12/28/22 0453 12/29/22 0516 12/29/22 0656 12/29/22 1423 12/30/22 0407 12/30/22 1455  HGB 11.8*   < >  --  10.4* 10.7*  --   --  11.1*  --   HCT 36.4   < >  --  32.0* 32.9*  --   --  33.6*  --   PLT 204  --   --  191 175  --   --  189  --   APTT  --   --   --   --   --   --   --  63* 63*  HEPARINUNFRC  --   --   --   --   --  0.11* 0.28* 0.51  --   CREATININE 1.19*   < >  --  0.96 0.88  --   --  0.95  --   TROPONINIHS 38*  --  34*  --   --   --   --   --   --    < > = values in this interval not displayed.    Estimated Creatinine Clearance: 67.1 mL/min (by C-G formula based on SCr of 0.95 mg/dL).   Medical History: Past Medical History:  Diagnosis Date   Abdominal discomfort    Chronic abdominal and pelvic pain resulting in significant loss of time from work   Anxiety    with depression   Asthma    Chest pain    Depression    Diabetes mellitus    Drug overdose 06/12/2007   60 Naprosyn, psychiatric admission   Hyperlipidemia    Severe   Hypertension    Migraines    Tremor of both hands    on propranolol    Medications:  Medications Prior to Admission  Medication Sig Dispense Refill Last Dose   albuterol (VENTOLIN HFA) 108 (90 Base) MCG/ACT inhaler Inhale  2 puffs into the lungs every 4 (four) hours as needed for wheezing or shortness of breath. (NEEDS TO BE SEEN) 18 g 1 unk   atorvastatin (LIPITOR) 40 MG tablet Take 40 mg by mouth daily.   12/27/2022   benazepril (LOTENSIN) 20 MG tablet Take 1 tablet (20 mg total) by mouth daily. 90 tablet 1 12/27/2022   buPROPion (WELLBUTRIN XL) 150 MG 24 hr tablet TAKE 3 TABLETS BY MOUTH DAILY. 270 tablet 0 12/27/2022   EPINEPHrine 0.3 mg/0.3 mL IJ SOAJ injection Inject 0.3 mg into the muscle as needed for anaphylaxis. 2 each 2 unk   fenofibrate 160 MG tablet Take 1 tablet (160 mg total) by mouth daily. 90 tablet 1 12/27/2022   glipiZIDE (GLUCOTROL XL) 10 MG 24 hr tablet Take 1 tablet (10 mg total) by mouth daily. 180 tablet 1 12/26/2022   lisinopril (ZESTRIL) 10 MG tablet Take 1  tablet (10 mg total) by mouth daily. 90 tablet 1 12/26/2022   metFORMIN (GLUCOPHAGE) 500 MG tablet Take 1 tablet (500 mg total) by mouth 2 (two) times daily with a meal. 180 tablet 1 12/27/2022 at am   propranolol (INDERAL) 40 MG tablet Take 1 tablet (40 mg total) by mouth 2 (two) times daily. (Needs to be seen before next refill) 180 tablet 1 Past Week   rizatriptan (MAXALT) 10 MG tablet Take 1 tablet (10 mg total) by mouth as needed for migraine. May repeat in 2 hours if needed 10 tablet 2 unk   RYBELSUS 7 MG TABS TAKE 1 TABLET (7 MG TOTAL) BY MOUTH DAILY 90 tablet 1 12/27/2022   topiramate (TOPAMAX) 50 MG tablet Take 1 tablet (50 mg total) by mouth 2 (two) times daily. 180 tablet 1 12/27/2022    Assessment: 62 y.o. F presented with chest pain. Found to have severe multivessel disease on cath. Heparin was started 2 hours post TR band removal. TR band removed 7/19 2100, for CVTS consult. Noted that patient has elevated triglycerides, which could alter heparin anti-Xa level results. Assessed both levels on 7/21 to determine the correlation.    aPTT 63 (subtherapeutic) despite rate increase to 1600 units/hr. Will rely on aPTT results for dose  adjustments in the setting of elevated triglycerides. Infusion running continuously and no episodes of bleeding per RN.   Goal of Therapy:  aPTT 66-102 seconds Monitor platelets by anticoagulation protocol: Yes   Plan:  Increase heparin gtt to 1750 units/hr. No bolus. Will f/u aPTT level in 6 hours Monitor daily aPTT and CBC; monitor for s/sx of bleeding  Loralee Pacas, PharmD, BCPS 12/30/2022 3:45 PM   Please check AMION for all [facility] phone numbers After 10:00 PM, call Main Pharmacy [phone]

## 2022-12-30 NOTE — Progress Notes (Signed)
PROGRESS NOTE  [name]:[id number] DOB: [birth year] DOA: 12/27/2022 PCP: Bennie Pierini, FNP   LOS: 3 days   Brief Narrative / Interim history: 62 y.o. female with medical history significant for T2DM, HTN, HLD, depression/anxiety who presented to the ED for evaluation of chest pain and near syncopal episode. Patient states that for the last week she has been having intermittent chest pain.  This is described as pressure-like discomfort across her chest, to her back, and down her left arm.  This is worse with exertion.  She says normally walks her dogs 1.5 miles however since symptoms started she can barely walk a block before her symptoms occur.  Symptoms are associated with shortness of breath.  She has had the symptoms occasionally while at rest as well. She also reports nausea. She was seen by [facility] cardiology 12/24/2022 who recommended outpatient stress testing and echocardiogram, not yet performed. On the day of admission she was at a restaurant with friends when she suddenly became diaphoretic and nauseous while she was sitting down.   Subjective / 24h Interval events: No acute issues or events overnight, denies any further episodes of nausea vomiting diarrhea constipation any fevers chills chest pain or shortness of breath  Assesement and Plan: Principal Problem:   Chest pain Active Problems:   Hypotension   AKI (acute kidney injury) (HCC)   Type 2 diabetes mellitus (HCC)   Hyperlipidemia associated with type 2 diabetes mellitus (HCC)   Chest pain in the setting of unstable angina, severe multivessel coronary artery disease -Cardiology following -Cardiac cath 7/19 shows severe multivessel disease LVEDP 13 mmHg -Continue heparin drip, discussed with cardiothoracic surgery for CABG  Hypotension with history of hypertension -Questionably secondary to above   Acute kidney injury, resolving -Likely secondary to hypotension as above   Type 2 diabetes, un  controlled, with hyperglycemia  -Uncontrolled with A1c 9.1 -Continue sliding scale insulin, hypoglycemic protocol diabetic diet -Hold home glycemic medications   Hyperlipidemia - Continue atorvastatin. Lipid panel shows poorly controled profile   Depression/anxiety - Continue Wellbutrin.   Code Status: DNR  Family Communication: no family at bedside   Status is: Inpatient  Remains inpatient appropriate because: severity of illness  Level of care: Progressive  Consultants:  Cardiology   Objective: Vitals:   12/29/22 2349 12/30/22 0310 12/30/22 0318 12/30/22 0504  BP: (!) 150/67   126/67  Pulse: 61 69 66 74  Resp:    (!) 70  Temp:    98.3 F (36.8 C)  TempSrc:    Oral  SpO2: 100% 100% 100% 100%  Weight:    90.9 kg  Height:        Intake/Output Summary (Last 24 hours) at 12/30/2022 0815 Last data filed at 12/30/2022 0500 Gross per 24 hour  Intake 740.69 ml  Output --  Net 740.69 ml   Wt Readings from Last 3 Encounters:  12/30/22 90.9 kg  11/26/22 93 kg  08/21/22 91.2 kg    Examination:  Constitutional: NAD Eyes: no scleral icterus ENMT: Mucous membranes are moist.  Neck: normal, supple Respiratory: clear to auscultation bilaterally, no wheezing, no crackles.  Cardiovascular: Regular rate and rhythm, no murmurs / rubs / gallops.  Abdomen: non distended, no tenderness. Bowel sounds positive.  Musculoskeletal: no clubbing / cyanosis.   Data Reviewed: I have independently reviewed following labs and imaging studies   CBC Recent Labs  Lab 12/27/22 1618 12/27/22 1635 12/28/22 0453 12/29/22 0516 12/30/22 0407  WBC 10.5  --  8.6 7.5  7.9  HGB 11.8* 11.6* 10.4* 10.7* 11.1*  HCT 36.4 34.0* 32.0* 32.9* 33.6*  PLT 204  --  191 175 189  MCV 93.1  --  92.8 90.6 89.4  MCH 30.2  --  30.1 29.5 29.5  MCHC 32.4  --  32.5 32.5 33.0  RDW 13.6  --  13.8 13.7 13.4  LYMPHSABS 2.0  --   --   --   --   MONOABS 0.6  --   --   --   --   EOSABS 0.1  --   --   --   --    BASOSABS 0.1  --   --   --   --     Recent Labs  Lab 12/27/22 1618 12/27/22 1635 12/28/22 0453 12/29/22 0516 12/30/22 0407  NA 135 138 134* 137 135  K 3.8 3.9 4.0 4.1 3.7  CL 108 110 108 107 105  CO2 17*  --  16* 22 18*  GLUCOSE 294* 296* 262* 213* 210*  BUN 27* 25* 23 12 15   CREATININE 1.19* 1.20* 0.96 0.88 0.95  CALCIUM 8.8*  --  8.9 9.0 9.0  AST  --   --   --  19  --   ALT  --   --   --  23  --   ALKPHOS  --   --   --  41  --   BILITOT  --   --   --  0.4  --   ALBUMIN  --   --   --  3.3*  --   MG  --   --   --  1.3* 1.9  DDIMER <0.27  --   --   --   --   BNP 46.8  --   --   --   --     ------------------------------------------------------------------------------------------------------------------ Recent Labs    12/28/22 0453  CHOL 261*  HDL 38*  LDLCALC UNABLE TO CALCULATE IF TRIGLYCERIDE OVER 400 mg/dL  TRIG 161*  CHOLHDL 6.9  LDLDIRECT 136*    Lab Results  Component Value Date   HGBA1C 9.1 (H) 11/26/2022   ------------------------------------------------------------------------------------------------------------------ No results for input(s): "TSH", "T4TOTAL", "T3FREE", "THYROIDAB" in the last 72 hours.  Invalid input(s): "FREET3"  Cardiac Enzymes No results for input(s): "CKMB", "TROPONINI", "MYOGLOBIN" in the last 168 hours.  Invalid input(s): "CK" ------------------------------------------------------------------------------------------------------------------    Component Value Date/Time   BNP 46.8 12/27/2022 1618    CBG: Recent Labs  Lab 12/29/22 1724 12/29/22 2106 12/29/22 2242 12/30/22 0459 12/30/22 0721  GLUCAP 169* 239* 235* 193* 243*    Recent Results (from the past 240 hour(s))  SARS Coronavirus 2 by RT PCR (hospital order, performed in [facility] hospital lab) *cepheid single result test* Anterior Nasal Swab     Status: None   Collection Time: 12/27/22  4:18 PM   Specimen: Anterior Nasal Swab  Result Value Ref Range  Status   SARS Coronavirus 2 by RT PCR NEGATIVE NEGATIVE Final    Comment: (NOTE) SARS-CoV-2 target nucleic acids are NOT DETECTED.  The SARS-CoV-2 RNA is generally detectable in upper and lower respiratory specimens during the acute phase of infection. The lowest concentration of SARS-CoV-2 viral copies this assay can detect is 250 copies / mL. A negative result does not preclude SARS-CoV-2 infection and should not be used as the sole basis for treatment or other patient management decisions.  A negative result may occur with improper specimen collection / handling, submission of specimen  other than nasopharyngeal swab, presence of viral mutation(s) within the areas targeted by this assay, and inadequate number of viral copies (<250 copies / mL). A negative result must be combined with clinical observations, patient history, and epidemiological information.  Fact Sheet for Patients:   RoadLapTop.co.za  Fact Sheet for Healthcare Providers: http://kim-miller.com/  This test is not yet approved or  cleared by the [place] FDA and has been authorized for detection and/or diagnosis of SARS-CoV-2 by FDA under an Emergency Use Authorization (EUA).  This EUA will remain in effect (meaning this test can be used) for the duration of the COVID-19 declaration under Section 564(b)(1) of the Act, 21 U.S.C. section 360bbb-3(b)(1), unless the authorization is terminated or revoked sooner.  Performed at [facility], [address] [place], [place] [address]      Radiology Studies: ECHOCARDIOGRAM COMPLETE  Result Date: 12/29/2022    ECHOCARDIOGRAM REPORT   Patient Name:   [name] Date of Exam: 12/29/2022 Medical Rec #:  [id number]            Height:       64.0 in Accession #:    [id number]           Weight:       202.3 lb Date of Birth:  Dec 08, 1960            BSA:          1.966 m Patient Age:    62 years              BP:           117/66 mmHg Patient Gender: F                    HR:           70 bpm. Exam Location:  Inpatient Procedure: 2D Echo, Cardiac Doppler and Color Doppler Indications:    Chest Pain R07.9  History:        Patient has prior history of Echocardiogram examinations. CAD;                 Risk Factors:Hypertension, Diabetes, Dyslipidemia and                 Non-Smoker.  Sonographer:    Dondra Prader RVT RCS Referring Phys: [id number] [facility] A CHANDRASEKHAR IMPRESSIONS  1. Left ventricular ejection fraction, by estimation, is 60 to 65%. The left ventricle has normal function. The left ventricle has no regional wall motion abnormalities. Left ventricular diastolic parameters were normal.  2. Right ventricular systolic function is normal. The right ventricular size is normal.  3. Left atrial size was mildly dilated.  4. Trivial mitral valve regurgitation.  5. The aortic valve is tricuspid. Aortic valve regurgitation is not visualized. Aortic valve sclerosis is present, with no evidence of aortic valve stenosis.  6. The inferior vena cava is normal in size with greater than 50% respiratory variability, suggesting right atrial pressure of 3 mmHg. FINDINGS  Left Ventricle: Left ventricular ejection fraction, by estimation, is 60 to 65%. The left ventricle has normal function. The left ventricle has no regional wall motion abnormalities. The left ventricular internal cavity size was normal in size. There is  no left ventricular hypertrophy. Left ventricular diastolic parameters were normal. Right Ventricle: The right ventricular size is normal. Right vetricular wall thickness was not assessed. Right ventricular systolic function is normal. Left Atrium: Left atrial size was mildly dilated. Right Atrium: Right atrial  size was normal in size. Pericardium: There is no evidence of pericardial effusion. Mitral Valve: Mild mitral annular calcification. Trivial mitral valve regurgitation. Tricuspid Valve: The tricuspid valve is  normal in structure. Tricuspid valve regurgitation is trivial. Aortic Valve: The aortic valve is tricuspid. Aortic valve regurgitation is not visualized. Aortic valve sclerosis is present, with no evidence of aortic valve stenosis. Aortic valve mean gradient measures 10.5 mmHg. Aortic valve peak gradient measures 19.9 mmHg. Aortic valve area, by VTI measures 2.10 cm. Pulmonic Valve: The pulmonic valve was not well visualized. Pulmonic valve regurgitation is not visualized. Aorta: The aortic root and ascending aorta are structurally normal, with no evidence of dilitation. Venous: The inferior vena cava is normal in size with greater than 50% respiratory variability, suggesting right atrial pressure of 3 mmHg. IAS/Shunts: No atrial level shunt detected by color flow Doppler.  LEFT VENTRICLE PLAX 2D LVIDd:         4.70 cm   Diastology LVIDs:         3.00 cm   LV e' medial:    7.72 cm/s LV PW:         1.00 cm   LV E/e' medial:  14.4 LV IVS:        0.90 cm   LV e' lateral:   10.80 cm/s LVOT diam:     2.00 cm   LV E/e' lateral: 10.3 LV SV:         94 LV SV Index:   48 LVOT Area:     3.14 cm  RIGHT VENTRICLE             IVC RV Basal diam:  2.65 cm     IVC diam: 1.90 cm RV Mid diam:    2.40 cm RV S prime:     15.90 cm/s TAPSE (M-mode): 2.2 cm LEFT ATRIUM             Index        RIGHT ATRIUM           Index LA diam:        3.70 cm 1.88 cm/m   RA Area:     10.20 cm LA Vol (A2C):   62.2 ml 31.64 ml/m  RA Volume:   21.50 ml  10.94 ml/m LA Vol (A4C):   67.6 ml 34.39 ml/m LA Biplane Vol: 65.8 ml 33.47 ml/m  AORTIC VALVE                     PULMONIC VALVE AV Area (Vmax):    2.06 cm      PV Vmax:       1.21 m/s AV Area (Vmean):   2.09 cm      PV Peak grad:  5.9 mmHg AV Area (VTI):     2.10 cm AV Vmax:           223.00 cm/s AV Vmean:          151.500 cm/s AV VTI:            0.448 m AV Peak Grad:      19.9 mmHg AV Mean Grad:      10.5 mmHg LVOT Vmax:         146.00 cm/s LVOT Vmean:        101.000 cm/s LVOT VTI:           0.300 m LVOT/AV VTI ratio: 0.67  AORTA Ao Root diam: 2.70 cm Ao Asc diam:  3.40 cm  MITRAL VALVE MV Area (PHT): 3.08 cm     SHUNTS MV Decel Time: 246 msec     Systemic VTI:  0.30 m MV E velocity: 111.00 cm/s  Systemic Diam: 2.00 cm MV A velocity: 112.00 cm/s MV E/A ratio:  0.99 Dietrich Pates MD Electronically signed by Dietrich Pates MD Signature Date/Time: 12/29/2022/11:50:02 AM    Final     Carma Leaven DO [facility]  Between 7 pm - 7 am I am not available, please contact night coverage MD/APP via Amion

## 2022-12-30 NOTE — Consult Note (Addendum)
[address]       [place] [address]             [phone]        [name] [facility] Medical Record #[id number] Date of Birth: Oct 17, 1960  Referring: Sharrell Ku, MD Primary Care Provider: Bennie Pierini, FNP Primary Cardiologist: Prior [facility]-. New to [facility]  Reason for Consult:  Evaluation for potential coronary artery bypass grafting for multivessel coronary artery disease after presenting with unstable angina pectoris.   History of Present Illness:     [name] is a 62 year old female with a past medical history notable for hypertension, type 2 diabetes mellitus, dyslipidemia, and aspirin allergy.  She is a lifelong non-smoker but has a strong family history of coronary artery disease.  She presented to a [facility] emergency room on 12/23/2022 after experiencing a bout of chest pain and was told it was not related to her heart.  On 12/27/22, she had a more severe episode of chest pain associated with nausea and diaphoresis while she was dining at [employer] with friends.  EMS was called and she was transported to [facility] ED where she was found to to be hypotensive with a blood pressure of 60/30.  Initial high-sensitivity troponin was 38 and repeat assay was 34.  EKG showed sinus rhythm with no acute ischemic changes.  A CT scan of the chest showed significant calcification of the aortic valve and three-vessel coronary calcification as well. BP was stabilized with fluid resuscitation and she was admitted to the hospital with diagnosis of acute non-ST elevation myocardial infarction.  She was started on heparin and nitroglycerine infusions. She had an echocardiogram showing normal biventricular function with trivial MR and aortic valve sclerosis without significant stenosis.  Left heart catheterization was performed on 12/28/2022 revealing three-vessel coronary artery disease.  There is a 99% mid LAD stenosis.  The  first diagonal has a long 70% proximal stenosis.  The circumflex has a proximal 75% stenosis before the first obtuse marginal.  The RCA is totally occluded near its midpoint.  Our right to right and left to right collaterals.  CT surgery has been asked to evaluate [name] for consideration of coronary bypass grafting. [name] reports she has been having chest pain off and on throughout the spring and summer.  She noticed this discomfort while walking her dogs on several occasions.  She had a particularly severe episode of chest pain July 4 holiday when she was moving several boxes into a new residence. She has had a few episodes of chest pain since admission.    [name] was recently widowed when she lost her husband during the COVID epidemic. She does not have any children and does not have any family in the local area.  She has lost 40lbs in the past year by walking, swimming and being more careful with her diet. She had been walking her dog about 1 mile three times daily before developing exertional angina. She is left handed.     Current Activity/ Functional Status: Patient is independent with mobility/ambulation, transfers, ADL's, IADL's.   Zubrod Score: At the time of surgery this patient's most appropriate activity status/level should be described as: []     0    Normal activity, no symptoms [x]     1    Restricted in physical strenuous activity but ambulatory, able to do out light work []     2  Ambulatory and capable of self care, unable to do work activities, up and about                 more than 50%  Of the time                            []     3    Only limited self care, in bed greater than 50% of waking hours []     4    Completely disabled, no self care, confined to bed or chair []     5    Moribund  Past Medical History:  Diagnosis Date   Abdominal discomfort    Chronic abdominal and pelvic pain resulting in significant loss of time from work   Anxiety     with depression   Asthma    Chest pain    Depression    Diabetes mellitus    Drug overdose 06/12/2007   60 Naprosyn, psychiatric admission   Hyperlipidemia    Severe   Hypertension    Migraines    Tremor of both hands    on propranolol    Past Surgical History:  Procedure Laterality Date   ABDOMINAL HYSTERECTOMY  02/10/2007   + lysis of adhesions   APPENDECTOMY  1990s   ruptured, late 90s   BILATERAL OOPHORECTOMY      in 2 surgeries prior to 9/08   CARPAL TUNNEL RELEASE Right 08/16/2021   Procedure: RIGHT CARPAL TUNNEL RELEASE;  Surgeon: Marlyne Beards, MD;  Location: [facility];  Service: Orthopedics;  Laterality: Right;   COLONOSCOPY  02/09/2010    normal upper endoscopy, single colonic polyp,   HERNIA REPAIR     VENTRAL HERNIA REPAIR     x3    Social History   Tobacco Use  Smoking Status Never  Smokeless Tobacco Never    Social History   Substance and Sexual Activity  Alcohol Use No     Allergies  Allergen Reactions   Aspirin Anaphylaxis   Bee Venom Anaphylaxis   Benadryl [Diphenhydramine Hcl] Anaphylaxis   Morphine And Codeine Shortness Of Breath and Itching   Vicks Formula 44 Cough-Cold Pm [Dm-Apap-Cpm] Shortness Of Breath and Rash    Not anaphylaxis    Current Facility-Administered Medications  Medication Dose Route Frequency Provider Last Rate Last Admin   0.9 %  sodium chloride infusion  250 mL Intravenous PRN [name], MD       acetaminophen (TYLENOL) tablet 650 mg  650 mg Oral Q6H PRN Charlsie Quest, MD       Or   acetaminophen (TYLENOL) suppository 650 mg  650 mg Rectal Q6H PRN Darreld Mclean R, MD       albuterol (PROVENTIL) (2.5 MG/3ML) 0.083% nebulizer solution 2.5 mg  2.5 mg Nebulization Q4H PRN Lynden Ang, RPH       atorvastatin (LIPITOR) tablet 40 mg  40 mg Oral Daily Darreld Mclean R, MD   40 mg at 12/30/22 0816   buPROPion (WELLBUTRIN XL) 24 hr tablet 450 mg  450 mg Oral Daily Darreld Mclean R, MD   450 mg at  12/30/22 0816   heparin ADULT infusion 100 units/mL (25000 units/215mL)  1,600 Units/hr Intravenous Continuous Arma Heading, RPH 16 mL/hr at 12/30/22 0915 1,600 Units/hr at 12/30/22 0915   insulin aspart (novoLOG) injection 0-5 Units  0-5 Units Subcutaneous QHS Charlsie Quest, MD   2 Units at 12/29/22  2144   insulin aspart (novoLOG) injection 0-9 Units  0-9 Units Subcutaneous TID WC Charlsie Quest, MD   3 Units at 12/30/22 0814   nitroGLYCERIN 50 mg in dextrose 5 % 250 mL (0.2 mg/mL) infusion  2-200 mcg/min Intravenous Titrated Wilmot, Kobina A, MD 9 mL/hr at 12/30/22 0005 30 mcg/min at 12/30/22 0005   ondansetron (ZOFRAN) tablet 4 mg  4 mg Oral Q6H PRN Charlsie Quest, MD       Or   ondansetron (ZOFRAN) injection 4 mg  4 mg Intravenous Q6H PRN Charlsie Quest, MD       ondansetron (ZOFRAN) injection 4 mg  4 mg Intravenous Q6H PRN [name], MD       oxyCODONE (Oxy IR/ROXICODONE) immediate release tablet 5 mg  5 mg Oral Q4H PRN Darreld Mclean R, MD   5 mg at 12/29/22 2255   senna-docusate (Senokot-S) tablet 1 tablet  1 tablet Oral QHS PRN Darreld Mclean R, MD       sodium chloride flush (NS) 0.9 % injection 3 mL  3 mL Intravenous Q12H Patel, Eston Esters R, MD   3 mL at 12/28/22 2200   sodium chloride flush (NS) 0.9 % injection 3 mL  3 mL Intravenous Q12H [name], MD   3 mL at 12/28/22 2145   sodium chloride flush (NS) 0.9 % injection 3 mL  3 mL Intravenous PRN [name], MD       topiramate (TOPAMAX) tablet 50 mg  50 mg Oral BID Charlsie Quest, MD   50 mg at 12/30/22 0816    Medications Prior to Admission  Medication Sig Dispense Refill Last Dose   albuterol (VENTOLIN HFA) 108 (90 Base) MCG/ACT inhaler Inhale 2 puffs into the lungs every 4 (four) hours as needed for wheezing or shortness of breath. (NEEDS TO BE SEEN) 18 g 1 unk   atorvastatin (LIPITOR) 40 MG tablet Take 40 mg by mouth daily.   12/27/2022   benazepril (LOTENSIN) 20 MG tablet Take 1 tablet (20 mg total) by  mouth daily. 90 tablet 1 12/27/2022   buPROPion (WELLBUTRIN XL) 150 MG 24 hr tablet TAKE 3 TABLETS BY MOUTH DAILY. 270 tablet 0 12/27/2022   EPINEPHrine 0.3 mg/0.3 mL IJ SOAJ injection Inject 0.3 mg into the muscle as needed for anaphylaxis. 2 each 2 unk   fenofibrate 160 MG tablet Take 1 tablet (160 mg total) by mouth daily. 90 tablet 1 12/27/2022   glipiZIDE (GLUCOTROL XL) 10 MG 24 hr tablet Take 1 tablet (10 mg total) by mouth daily. 180 tablet 1 12/26/2022   lisinopril (ZESTRIL) 10 MG tablet Take 1 tablet (10 mg total) by mouth daily. 90 tablet 1 12/26/2022   metFORMIN (GLUCOPHAGE) 500 MG tablet Take 1 tablet (500 mg total) by mouth 2 (two) times daily with a meal. 180 tablet 1 12/27/2022 at am   propranolol (INDERAL) 40 MG tablet Take 1 tablet (40 mg total) by mouth 2 (two) times daily. (Needs to be seen before next refill) 180 tablet 1 Past Week   rizatriptan (MAXALT) 10 MG tablet Take 1 tablet (10 mg total) by mouth as needed for migraine. May repeat in 2 hours if needed 10 tablet 2 unk   RYBELSUS 7 MG TABS TAKE 1 TABLET (7 MG TOTAL) BY MOUTH DAILY 90 tablet 1 12/27/2022   topiramate (TOPAMAX) 50 MG tablet Take 1 tablet (50 mg total) by mouth 2 (two) times daily. 180 tablet 1 12/27/2022  History reviewed. No pertinent family history.   Review of Systems:      Cardiac Review of Systems: Y or  [    ]= no  Chest Pain [  x  ]  Resting SOB [   ] Exertional SOB  [x  ]  Orthopnea [  ]   Pedal Edema [   ]    Palpitations [  ] Syncope  [  ]   Presyncope [  x ]  General Review of Systems: [Y] = yes [  ]=no Constitional: recent weight change [  ]; anorexia [  ]; fatigue [  ]; nausea [ x ]; night sweats [  ]; fever [  ]; or chills [  ]                                                               Dental: Last Dentist visit:   Eye : blurred vision [  ]; diplopia [   ]; vision changes [  ];  Amaurosis fugax[  ]; Resp: cough [  ];  wheezing[  ];  hemoptysis[  ]; shortness of breath[ x ]; paroxysmal  nocturnal dyspnea[  ]; dyspnea on exertion[ x ]; or orthopnea[  ];  GI:  gallstones[  ], vomiting[  ];  dysphagia[  ]; melena[  ];  hematochezia [  ]; heartburn[  ];   Hx of  Colonoscopy[  ]; GU: kidney stones [  ]; hematuria[  ];   dysuria [  ];  nocturia[  ];  history of     obstruction [  ]; urinary frequency [  ]             Skin: rash, swelling[  ];, hair loss[  ];  peripheral edema[  ];  or itching[  ]; Musculosketetal: myalgias[  ];  joint swelling[  ];  joint erythema[  ];  joint pain[  ];  back pain[  ];  Heme/Lymph: bruising[  ];  bleeding[  ];  anemia[  ];  Neuro: TIA[  ];  headaches[  ];  stroke[  ];  vertigo[  ];  seizures[  ];   paresthesias[  ];  difficulty walking[  ];  Psych:depression[  ]; anxiety[  ];  Endocrine: diabetes[ x ];  thyroid dysfunction[  ];                 Physical Exam: BP 126/67 (BP Location: Left Arm)   Pulse 74   Temp 98.3 F (36.8 C) (Oral)   Resp (!) 70   Ht 5\' 4"  (1.626 m)   Wt 90.9 kg   SpO2 100%   BMI 34.38 kg/m    General appearance: alert, cooperative, and no distress Head: Normocephalic, without obvious abnormality, atraumatic Neck: no adenopathy, no carotid bruit, no JVD, and supple, symmetrical, trachea midline Lymph nodes: no cervical or clavicular adenopathy Resp: clear to auscultation bilaterally Cardio: RRR, monitor showing normal sinus rhythm GI: soft, NT, well-healed midline scar Extremities: well perfused with palpable distal pulses, no significant LE varicosities. Neurologic: Grossly normal   Recent Radiology Findings:   CLINICAL DATA:  Chest pain and near syncope.   EXAM: PORTABLE CHEST 1 VIEW   COMPARISON:  April 06, 2020   FINDINGS: The heart size and mediastinal contours  are within normal limits. Both lungs are clear. The visualized skeletal structures are unremarkable.   IMPRESSION: No active disease.     Electronically Signed   By: Aram Candela M.D.   On: 12/27/2022 16:44   Diagnostic  Studies & Laboratory data:    Result Date: 12/29/2022    ECHOCARDIOGRAM REPORT   Patient Name:   [name] Date of Exam: 12/29/2022 Medical Rec #:  [id number]            Height:       64.0 in Accession #:    [id number]           Weight:       202.3 lb Date of Birth:  [birth year]            BSA:          1.966 m Patient Age:    62 years             BP:           117/66 mmHg Patient Gender: F                    HR:           70 bpm. Exam Location:  Inpatient Procedure: 2D Echo, Cardiac Doppler and Color Doppler Indications:    Chest Pain R07.9  History:        Patient has prior history of Echocardiogram examinations. CAD;                 Risk Factors:Hypertension, Diabetes, Dyslipidemia and                 Non-Smoker.  Sonographer:    Dondra Prader RVT RCS Referring Phys: [id number] [facility] CHANDRASEKHAR IMPRESSIONS  1. Left ventricular ejection fraction, by estimation, is 60 to 65%. The left ventricle has normal function. The left ventricle has no regional wall motion abnormalities. Left ventricular diastolic parameters were normal.  2. Right ventricular systolic function is normal. The right ventricular size is normal.  3. Left atrial size was mildly dilated.  4. Trivial mitral valve regurgitation.  5. The aortic valve is tricuspid. Aortic valve regurgitation is not visualized. Aortic valve sclerosis is present, with no evidence of aortic valve stenosis.  6. The inferior vena cava is normal in size with greater than 50% respiratory variability, suggesting right atrial pressure of 3 mmHg. FINDINGS  Left Ventricle: Left ventricular ejection fraction, by estimation, is 60 to 65%. The left ventricle has normal function. The left ventricle has no regional wall motion abnormalities. The left ventricular internal cavity size was normal in size. There is  no left ventricular hypertrophy. Left ventricular diastolic parameters were normal. Right Ventricle: The right ventricular size is normal. Right vetricular wall  thickness was not assessed. Right ventricular systolic function is normal. Left Atrium: Left atrial size was mildly dilated. Right Atrium: Right atrial size was normal in size. Pericardium: There is no evidence of pericardial effusion. Mitral Valve: Mild mitral annular calcification. Trivial mitral valve regurgitation. Tricuspid Valve: The tricuspid valve is normal in structure. Tricuspid valve regurgitation is trivial. Aortic Valve: The aortic valve is tricuspid. Aortic valve regurgitation is not visualized. Aortic valve sclerosis is present, with no evidence of aortic valve stenosis. Aortic valve mean gradient measures 10.5 mmHg. Aortic valve peak gradient measures 19.9 mmHg. Aortic valve area, by VTI measures 2.10 cm. Pulmonic Valve: The pulmonic valve was not well visualized. Pulmonic valve regurgitation is not visualized. Aorta:  The aortic root and ascending aorta are structurally normal, with no evidence of dilitation. Venous: The inferior vena cava is normal in size with greater than 50% respiratory variability, suggesting right atrial pressure of 3 mmHg. IAS/Shunts: No atrial level shunt detected by color flow Doppler.  LEFT VENTRICLE PLAX 2D LVIDd:         4.70 cm   Diastology LVIDs:         3.00 cm   LV e' medial:    7.72 cm/s LV PW:         1.00 cm   LV E/e' medial:  14.4 LV IVS:        0.90 cm   LV e' lateral:   10.80 cm/s LVOT diam:     2.00 cm   LV E/e' lateral: 10.3 LV SV:         94 LV SV Index:   48 LVOT Area:     3.14 cm  RIGHT VENTRICLE             IVC RV Basal diam:  2.65 cm     IVC diam: 1.90 cm RV Mid diam:    2.40 cm RV S prime:     15.90 cm/s TAPSE (M-mode): 2.2 cm LEFT ATRIUM             Index        RIGHT ATRIUM           Index LA diam:        3.70 cm 1.88 cm/m   RA Area:     10.20 cm LA Vol (A2C):   62.2 ml 31.64 ml/m  RA Volume:   21.50 ml  10.94 ml/m LA Vol (A4C):   67.6 ml 34.39 ml/m LA Biplane Vol: 65.8 ml 33.47 ml/m  AORTIC VALVE                     PULMONIC VALVE AV Area  (Vmax):    2.06 cm      PV Vmax:       1.21 m/s AV Area (Vmean):   2.09 cm      PV Peak grad:  5.9 mmHg AV Area (VTI):     2.10 cm AV Vmax:           223.00 cm/s AV Vmean:          151.500 cm/s AV VTI:            0.448 m AV Peak Grad:      19.9 mmHg AV Mean Grad:      10.5 mmHg LVOT Vmax:         146.00 cm/s LVOT Vmean:        101.000 cm/s LVOT VTI:          0.300 m LVOT/AV VTI ratio: 0.67  AORTA Ao Root diam: 2.70 cm Ao Asc diam:  3.40 cm MITRAL VALVE MV Area (PHT): 3.08 cm     SHUNTS MV Decel Time: 246 msec     Systemic VTI:  0.30 m MV E velocity: 111.00 cm/s  Systemic Diam: 2.00 cm MV A velocity: 112.00 cm/s MV E/A ratio:  0.99 Dietrich Pates MD Electronically signed by Dietrich Pates MD Signature Date/Time: 12/29/2022/11:50:02 AM    Final    CARDIAC CATHETERIZATION  Result Date: 12/28/2022 1.  Severe multivessel disease. 2.  LVEDP of 13 mmHg Recommendation: Will start heparin infusion 2 hours after TR band is removed; obtain cardiothoracic surgical evaluation.  Coronary Findings  Diagnostic Dominance: Right Left Anterior Descending  Mid LAD lesion is 99% stenosed.    First Diagonal Branch  1st Diag lesion is 70% stenosed.    Ramus Intermedius  Vessel is small. There is moderate diffuse disease throughout the vessel.    Left Circumflex  Prox Cx lesion is 75% stenosed.    Right Coronary Artery  Mid RCA lesion is 100% stenosed.    Right Posterior Descending Artery  Collaterals  RPDA filled by collaterals from RV Branch.      Third Right Posterolateral Branch  Collaterals  3rd RPL filled by collaterals from 3rd Mrg.    Collaterals  3rd RPL filled by collaterals from 3rd Mrg.      Intervention   No interventions have been documented.   Coronary Diagrams  Diagnostic Dominance: Right      I have independently reviewed the above radiologic studies and discussed with the patient   Recent Lab Findings: Lab Results  Component Value Date   WBC 7.9 12/30/2022   HGB 11.1 (L)  12/30/2022   HCT 33.6 (L) 12/30/2022   PLT 189 12/30/2022   GLUCOSE 210 (H) 12/30/2022   CHOL 261 (H) 12/28/2022   TRIG 594 (H) 12/28/2022   HDL 38 (L) 12/28/2022   LDLDIRECT 136 (H) 12/28/2022   LDLCALC UNABLE TO CALCULATE IF TRIGLYCERIDE OVER 400 mg/dL 16/03/9603   ALT 23 54/02/8118   AST 19 12/29/2022   NA 135 12/30/2022   K 3.7 12/30/2022   CL 105 12/30/2022   CREATININE 0.95 12/30/2022   BUN 15 12/30/2022   CO2 18 (L) 12/30/2022   TSH 2.600 07/23/2022   INR 1.04 06/17/2018   HGBA1C 9.1 (H) 11/26/2022      Assessment / Plan:      -Multivessel coronary artery disease presenting with unstable angina pectoris.  Currently pain-free on heparin and nitroglycerin infusions.  We agree coronary bypass grafting is her best option for revascularization.  The procedure and expected perioperative course are discussed with [name] and her questions were answered.  She would like to proceed with surgery as soon as it can be scheduled.  Will request pre-CABG vascular ultrasound.  Dr. Leafy Ro will see later and discuss timing of surgery.   -Type 2 diabetes mellitus-last hemoglobin A1c was 9.1 in June of this year.  Currently managed with metformin and Glucotrol XL.  -Dyslipidemia-on atorvastatin prior to admission  -History of hypertension  -Aspirin allergy-history of anaphylaxis    I  spent 25 minutes counseling the patient face to face.   Leary Roca, PA-C  12/30/2022 10:25 AM  Agree with above Have discussed with office and Dr Cliffton Asters to perform surgery on Tuesday

## 2022-12-30 NOTE — Progress Notes (Signed)
Pt reporting midsternal cp radiating to back, both arms and legs.  EKG with no acute changes noted.  IV ntg titrated per parameters.  BP 131/73.  Dr. Hyacinth Meeker notified.

## 2022-12-30 NOTE — Progress Notes (Signed)
ANTICOAGULATION CONSULT NOTE- Follow Up  Pharmacy Consult for Heparin Indication:  severe multivessel CAD  Allergies  Allergen Reactions   Aspirin Anaphylaxis   Bee Venom Anaphylaxis   Benadryl [Diphenhydramine Hcl] Anaphylaxis   Morphine And Codeine Shortness Of Breath and Itching   Vicks Formula 44 Cough-Cold Pm [Dm-Apap-Cpm] Shortness Of Breath and Rash    Not anaphylaxis    Patient Measurements: Height: 5\' 4"  (162.6 cm) Weight: 90.9 kg (200 lb 4.8 oz) IBW/kg (Calculated) : 54.7 Heparin Dosing Weight: 75 kg  Vital Signs: Temp: 98.1 F (36.7 C) (07/21 1626) Temp Source: Oral (07/21 1626) BP: 130/71 (07/21 1626) Pulse Rate: 82 (07/21 1626)  Labs: Recent Labs    12/28/22 0453 12/29/22 0516 12/29/22 0656 12/29/22 1423 12/30/22 0407 12/30/22 1455 12/30/22 2225  HGB 10.4* 10.7*  --   --  11.1*  --   --   HCT 32.0* 32.9*  --   --  33.6*  --   --   PLT 191 175  --   --  189  --   --   APTT  --   --   --   --  63* 63* 84*  HEPARINUNFRC  --   --  0.11* 0.28* 0.51  --   --   CREATININE 0.96 0.88  --   --  0.95  --   --     Estimated Creatinine Clearance: 67.1 mL/min (by C-G formula based on SCr of 0.95 mg/dL).   Medical History: Past Medical History:  Diagnosis Date   Abdominal discomfort    Chronic abdominal and pelvic pain resulting in significant loss of time from work   Anxiety    with depression   Asthma    Chest pain    Depression    Diabetes mellitus    Drug overdose 06/12/2007   60 Naprosyn, psychiatric admission   Hyperlipidemia    Severe   Hypertension    Migraines    Tremor of both hands    on propranolol    Medications:  Medications Prior to Admission  Medication Sig Dispense Refill Last Dose   albuterol (VENTOLIN HFA) 108 (90 Base) MCG/ACT inhaler Inhale 2 puffs into the lungs every 4 (four) hours as needed for wheezing or shortness of breath. (NEEDS TO BE SEEN) 18 g 1 unk   atorvastatin (LIPITOR) 40 MG tablet Take 40 mg by mouth daily.    12/27/2022   benazepril (LOTENSIN) 20 MG tablet Take 1 tablet (20 mg total) by mouth daily. 90 tablet 1 12/27/2022   buPROPion (WELLBUTRIN XL) 150 MG 24 hr tablet TAKE 3 TABLETS BY MOUTH DAILY. 270 tablet 0 12/27/2022   EPINEPHrine 0.3 mg/0.3 mL IJ SOAJ injection Inject 0.3 mg into the muscle as needed for anaphylaxis. 2 each 2 unk   fenofibrate 160 MG tablet Take 1 tablet (160 mg total) by mouth daily. 90 tablet 1 12/27/2022   glipiZIDE (GLUCOTROL XL) 10 MG 24 hr tablet Take 1 tablet (10 mg total) by mouth daily. 180 tablet 1 12/26/2022   lisinopril (ZESTRIL) 10 MG tablet Take 1 tablet (10 mg total) by mouth daily. 90 tablet 1 12/26/2022   metFORMIN (GLUCOPHAGE) 500 MG tablet Take 1 tablet (500 mg total) by mouth 2 (two) times daily with a meal. 180 tablet 1 12/27/2022 at am   propranolol (INDERAL) 40 MG tablet Take 1 tablet (40 mg total) by mouth 2 (two) times daily. (Needs to be seen before next refill) 180 tablet 1 Past Week  rizatriptan (MAXALT) 10 MG tablet Take 1 tablet (10 mg total) by mouth as needed for migraine. May repeat in 2 hours if needed 10 tablet 2 unk   RYBELSUS 7 MG TABS TAKE 1 TABLET (7 MG TOTAL) BY MOUTH DAILY 90 tablet 1 12/27/2022   topiramate (TOPAMAX) 50 MG tablet Take 1 tablet (50 mg total) by mouth 2 (two) times daily. 180 tablet 1 12/27/2022    Assessment: 62 y.o. F presented with chest pain. Found to have severe multivessel disease on cath. Heparin was started 2 hours post TR band removal. TR band removed 7/19 2100, for CVTS consult. Noted that patient has elevated triglycerides, which could alter heparin anti-Xa level results. Assessed both levels on 7/21 to determine the correlation.    aPTT 63 (subtherapeutic) despite rate increase to 1600 units/hr. Will rely on aPTT results for dose adjustments in the setting of elevated triglycerides. Infusion running continuously and no episodes of bleeding per RN.   7/21 PM: aPTT 84- therapeutic on 1750 units/h. No signs/symptoms of  bleeding reported or issues with the infusion. Will continue same rate  Goal of Therapy:  aPTT 66-102 seconds Monitor platelets by anticoagulation protocol: Yes   Plan:  Continue heparin gtt at 1750 units/hr F/u aPTT level in 6 hours Monitor daily aPTT and CBC; monitor for s/sx of bleeding  Arabella Merles, PharmD. Clinical Pharmacist 12/30/2022 11:44 PM

## 2022-12-30 NOTE — Progress Notes (Addendum)
ANTICOAGULATION CONSULT NOTE  Pharmacy Consult for Heparin Indication:  severe multivessel CAD  Allergies  Allergen Reactions   Aspirin Anaphylaxis   Bee Venom Anaphylaxis   Benadryl [Diphenhydramine Hcl] Anaphylaxis   Morphine And Codeine Shortness Of Breath and Itching   Vicks Formula 44 Cough-Cold Pm [Dm-Apap-Cpm] Shortness Of Breath and Rash    Not anaphylaxis    Patient Measurements: Height: 5\' 4"  (162.6 cm) Weight: 90.9 kg (200 lb 4.8 oz) IBW/kg (Calculated) : 54.7 Heparin Dosing Weight: 75 kg  Vital Signs: Temp: 98.3 F (36.8 C) (07/21 0504) Temp Source: Oral (07/21 0504) BP: 126/67 (07/21 0504) Pulse Rate: 74 (07/21 0504)  Labs: Recent Labs    12/27/22 1618 12/27/22 1635 12/27/22 1810 12/28/22 0453 12/29/22 0516 12/29/22 0656 12/29/22 1423 12/30/22 0407  HGB 11.8*   < >  --  10.4* 10.7*  --   --  11.1*  HCT 36.4   < >  --  32.0* 32.9*  --   --  33.6*  PLT 204  --   --  191 175  --   --  189  APTT  --   --   --   --   --   --   --  63*  HEPARINUNFRC  --   --   --   --   --  0.11* 0.28* 0.51  CREATININE 1.19*   < >  --  0.96 0.88  --   --  0.95  TROPONINIHS 38*  --  34*  --   --   --   --   --    < > = values in this interval not displayed.    Estimated Creatinine Clearance: 67.1 mL/min (by C-G formula based on SCr of 0.95 mg/dL).   Medical History: Past Medical History:  Diagnosis Date   Abdominal discomfort    Chronic abdominal and pelvic pain resulting in significant loss of time from work   Anxiety    with depression   Asthma    Chest pain    Depression    Diabetes mellitus    Drug overdose 06/12/2007   60 Naprosyn, psychiatric admission   Hyperlipidemia    Severe   Hypertension    Migraines    Tremor of both hands    on propranolol    Medications:  Medications Prior to Admission  Medication Sig Dispense Refill Last Dose   albuterol (VENTOLIN HFA) 108 (90 Base) MCG/ACT inhaler Inhale 2 puffs into the lungs every 4 (four) hours as  needed for wheezing or shortness of breath. (NEEDS TO BE SEEN) 18 g 1 unk   atorvastatin (LIPITOR) 40 MG tablet Take 40 mg by mouth daily.   12/27/2022   benazepril (LOTENSIN) 20 MG tablet Take 1 tablet (20 mg total) by mouth daily. 90 tablet 1 12/27/2022   buPROPion (WELLBUTRIN XL) 150 MG 24 hr tablet TAKE 3 TABLETS BY MOUTH DAILY. 270 tablet 0 12/27/2022   EPINEPHrine 0.3 mg/0.3 mL IJ SOAJ injection Inject 0.3 mg into the muscle as needed for anaphylaxis. 2 each 2 unk   fenofibrate 160 MG tablet Take 1 tablet (160 mg total) by mouth daily. 90 tablet 1 12/27/2022   glipiZIDE (GLUCOTROL XL) 10 MG 24 hr tablet Take 1 tablet (10 mg total) by mouth daily. 180 tablet 1 12/26/2022   lisinopril (ZESTRIL) 10 MG tablet Take 1 tablet (10 mg total) by mouth daily. 90 tablet 1 12/26/2022   metFORMIN (GLUCOPHAGE) 500 MG tablet Take 1 tablet (  500 mg total) by mouth 2 (two) times daily with a meal. 180 tablet 1 12/27/2022 at am   propranolol (INDERAL) 40 MG tablet Take 1 tablet (40 mg total) by mouth 2 (two) times daily. (Needs to be seen before next refill) 180 tablet 1 Past Week   rizatriptan (MAXALT) 10 MG tablet Take 1 tablet (10 mg total) by mouth as needed for migraine. May repeat in 2 hours if needed 10 tablet 2 unk   RYBELSUS 7 MG TABS TAKE 1 TABLET (7 MG TOTAL) BY MOUTH DAILY 90 tablet 1 12/27/2022   topiramate (TOPAMAX) 50 MG tablet Take 1 tablet (50 mg total) by mouth 2 (two) times daily. 180 tablet 1 12/27/2022    Assessment: 62 y.o. F presented with chest pain. Found to have severe multivessel disease on cath. Heparin was started 2 hours post TR band removal. TR band removed 7/19 2100, for CVTS consult. Noted that patient has elevated triglycerides, which could alter heparin anti-Xa level results. Assessed both levels on 7/21 to determine the correlation.   7/21 AM: Heparin level 0.51 (therapeutic) and aPTT 63 (subtherapeutic) on 1500 units/hr. Since results do not correlate, will rely on aPTT results for  dose adjustments in the setting of elevated triglycerides. Infusion running continuously overnight and no episodes of bleeding per RN.   Goal of Therapy:  aPTT 66-102 seconds Monitor platelets by anticoagulation protocol: Yes   Plan:  Increase heparin gtt to 1600 units/hr. No bolus. Will f/u aPTT level in 6 hours Monitor daily aPTT and CBC; monitor for s/sx of bleeding  Enos Fling, PharmD PGY-1 Acute Care Pharmacy Resident 12/30/2022 7:22 AM

## 2022-12-30 NOTE — Progress Notes (Signed)
Progress Note  Patient Name: [name] Date of Encounter: 12/30/2022  Primary Cardiologist: None   Subjective   No chest pain or sob since yesterday.  Inpatient Medications    Scheduled Meds:  atorvastatin  40 mg Oral Daily   buPROPion  450 mg Oral Daily   insulin aspart  0-5 Units Subcutaneous QHS   insulin aspart  0-9 Units Subcutaneous TID WC   sodium chloride flush  3 mL Intravenous Q12H   sodium chloride flush  3 mL Intravenous Q12H   topiramate  50 mg Oral BID   Continuous Infusions:  sodium chloride     heparin 1,600 Units/hr (12/30/22 0915)   nitroGLYCERIN 30 mcg/min (12/30/22 0005)   PRN Meds: sodium chloride, acetaminophen **OR** acetaminophen, albuterol, ondansetron **OR** ondansetron (ZOFRAN) IV, ondansetron (ZOFRAN) IV, oxyCODONE, senna-docusate, sodium chloride flush   Vital Signs    Vitals:   12/30/22 0310 12/30/22 0318 12/30/22 0504 12/30/22 1117  BP:   126/67 109/70  Pulse: 69 66 74 80  Resp:   (!) 70 18  Temp:   98.3 F (36.8 C) 98.2 F (36.8 C)  TempSrc:   Oral Oral  SpO2: 100% 100% 100% 98%  Weight:   90.9 kg   Height:        Intake/Output Summary (Last 24 hours) at 12/30/2022 1123 Last data filed at 12/30/2022 0500 Gross per 24 hour  Intake 740.69 ml  Output --  Net 740.69 ml   Filed Weights   12/27/22 1611 12/29/22 0330 12/30/22 0504  Weight: 91.2 kg 91.8 kg 90.9 kg    Telemetry    nsr - Personally Reviewed  ECG    none - Personally Reviewed  Physical Exam   GEN: No acute distress.   Neck: No JVD Cardiac: RRR, no murmurs, rubs, or gallops.  Respiratory: Clear to auscultation bilaterally. GI: Soft, nontender, non-distended  MS: No edema; No deformity. Neuro:  Nonfocal  Psych: Normal affect   Labs    Chemistry Recent Labs  Lab 12/28/22 0453 12/29/22 0516 12/30/22 0407  NA 134* 137 135  K 4.0 4.1 3.7  CL 108 107 105  CO2 16* 22 18*  GLUCOSE 262* 213* 210*  BUN 23 12 15   CREATININE 0.96 0.88 0.95   CALCIUM 8.9 9.0 9.0  PROT  --  5.8*  --   ALBUMIN  --  3.3*  --   AST  --  19  --   ALT  --  23  --   ALKPHOS  --  41  --   BILITOT  --  0.4  --   GFRNONAA >60 >60 >60  ANIONGAP 10 8 12      Hematology Recent Labs  Lab 12/28/22 0453 12/29/22 0516 12/30/22 0407  WBC 8.6 7.5 7.9  RBC 3.45* 3.63* 3.76*  HGB 10.4* 10.7* 11.1*  HCT 32.0* 32.9* 33.6*  MCV 92.8 90.6 89.4  MCH 30.1 29.5 29.5  MCHC 32.5 32.5 33.0  RDW 13.8 13.7 13.4  PLT 191 175 189    Cardiac EnzymesNo results for input(s): "TROPONINI" in the last 168 hours. No results for input(s): "TROPIPOC" in the last 168 hours.   BNP Recent Labs  Lab 12/27/22 1618  BNP 46.8     DDimer  Recent Labs  Lab 12/27/22 1618  DDIMER <0.27     Radiology    ECHOCARDIOGRAM COMPLETE  Result Date: 12/29/2022    ECHOCARDIOGRAM REPORT   Patient Name:   [name] Date of Exam: 12/29/2022  Medical Rec #:  [id number]            Height:       64.0 in Accession #:    [id number]           Weight:       202.3 lb Date of Birth:  Oct 14, 1960            BSA:          1.966 m Patient Age:    62 years             BP:           117/66 mmHg Patient Gender: F                    HR:           70 bpm. Exam Location:  Inpatient Procedure: 2D Echo, Cardiac Doppler and Color Doppler Indications:    Chest Pain R07.9  History:        Patient has prior history of Echocardiogram examinations. CAD;                 Risk Factors:Hypertension, Diabetes, Dyslipidemia and                 Non-Smoker.  Sonographer:    Dondra Prader RVT RCS Referring Phys: [id number] [facility] A CHANDRASEKHAR IMPRESSIONS  1. Left ventricular ejection fraction, by estimation, is 60 to 65%. The left ventricle has normal function. The left ventricle has no regional wall motion abnormalities. Left ventricular diastolic parameters were normal.  2. Right ventricular systolic function is normal. The right ventricular size is normal.  3. Left atrial size was mildly dilated.  4. Trivial mitral  valve regurgitation.  5. The aortic valve is tricuspid. Aortic valve regurgitation is not visualized. Aortic valve sclerosis is present, with no evidence of aortic valve stenosis.  6. The inferior vena cava is normal in size with greater than 50% respiratory variability, suggesting right atrial pressure of 3 mmHg. FINDINGS  Left Ventricle: Left ventricular ejection fraction, by estimation, is 60 to 65%. The left ventricle has normal function. The left ventricle has no regional wall motion abnormalities. The left ventricular internal cavity size was normal in size. There is  no left ventricular hypertrophy. Left ventricular diastolic parameters were normal. Right Ventricle: The right ventricular size is normal. Right vetricular wall thickness was not assessed. Right ventricular systolic function is normal. Left Atrium: Left atrial size was mildly dilated. Right Atrium: Right atrial size was normal in size. Pericardium: There is no evidence of pericardial effusion. Mitral Valve: Mild mitral annular calcification. Trivial mitral valve regurgitation. Tricuspid Valve: The tricuspid valve is normal in structure. Tricuspid valve regurgitation is trivial. Aortic Valve: The aortic valve is tricuspid. Aortic valve regurgitation is not visualized. Aortic valve sclerosis is present, with no evidence of aortic valve stenosis. Aortic valve mean gradient measures 10.5 mmHg. Aortic valve peak gradient measures 19.9 mmHg. Aortic valve area, by VTI measures 2.10 cm. Pulmonic Valve: The pulmonic valve was not well visualized. Pulmonic valve regurgitation is not visualized. Aorta: The aortic root and ascending aorta are structurally normal, with no evidence of dilitation. Venous: The inferior vena cava is normal in size with greater than 50% respiratory variability, suggesting right atrial pressure of 3 mmHg. IAS/Shunts: No atrial level shunt detected by color flow Doppler.  LEFT VENTRICLE PLAX 2D LVIDd:         4.70 cm   Diastology  LVIDs:  3.00 cm   LV e' medial:    7.72 cm/s LV PW:         1.00 cm   LV E/e' medial:  14.4 LV IVS:        0.90 cm   LV e' lateral:   10.80 cm/s LVOT diam:     2.00 cm   LV E/e' lateral: 10.3 LV SV:         94 LV SV Index:   48 LVOT Area:     3.14 cm  RIGHT VENTRICLE             IVC RV Basal diam:  2.65 cm     IVC diam: 1.90 cm RV Mid diam:    2.40 cm RV S prime:     15.90 cm/s TAPSE (M-mode): 2.2 cm LEFT ATRIUM             Index        RIGHT ATRIUM           Index LA diam:        3.70 cm 1.88 cm/m   RA Area:     10.20 cm LA Vol (A2C):   62.2 ml 31.64 ml/m  RA Volume:   21.50 ml  10.94 ml/m LA Vol (A4C):   67.6 ml 34.39 ml/m LA Biplane Vol: 65.8 ml 33.47 ml/m  AORTIC VALVE                     PULMONIC VALVE AV Area (Vmax):    2.06 cm      PV Vmax:       1.21 m/s AV Area (Vmean):   2.09 cm      PV Peak grad:  5.9 mmHg AV Area (VTI):     2.10 cm AV Vmax:           223.00 cm/s AV Vmean:          151.500 cm/s AV VTI:            0.448 m AV Peak Grad:      19.9 mmHg AV Mean Grad:      10.5 mmHg LVOT Vmax:         146.00 cm/s LVOT Vmean:        101.000 cm/s LVOT VTI:          0.300 m LVOT/AV VTI ratio: 0.67  AORTA Ao Root diam: 2.70 cm Ao Asc diam:  3.40 cm MITRAL VALVE MV Area (PHT): 3.08 cm     SHUNTS MV Decel Time: 246 msec     Systemic VTI:  0.30 m MV E velocity: 111.00 cm/s  Systemic Diam: 2.00 cm MV A velocity: 112.00 cm/s MV E/A ratio:  0.99 Dietrich Pates MD Electronically signed by Dietrich Pates MD Signature Date/Time: 12/29/2022/11:50:02 AM    Final    CARDIAC CATHETERIZATION  Result Date: 12/28/2022 1.  Severe multivessel disease. 2.  LVEDP of 13 mmHg Recommendation: Will start heparin infusion 2 hours after TR band is removed; obtain cardiothoracic surgical evaluation.    Cardiac Studies   See above  Patient Profile     62 y.o. female admitted with [place], and found to have 3 vessel disease.   Assessment & Plan    3V CAD - she is now pain free on IV heparin and IV NTG. We have consulted  CVTS to make rec's for timing of CABG.      For questions or updates, please contact [facility] Please  consult [email or web address] for contact info under Cardiology/STEMI.      Signed, Lewayne Bunting, MD  12/30/2022, 11:23 AM

## 2022-12-31 ENCOUNTER — Inpatient Hospital Stay (HOSPITAL_COMMUNITY): Payer: 59

## 2022-12-31 ENCOUNTER — Other Ambulatory Visit (HOSPITAL_COMMUNITY): Payer: Self-pay

## 2022-12-31 ENCOUNTER — Encounter (HOSPITAL_COMMUNITY): Payer: Self-pay | Admitting: Internal Medicine

## 2022-12-31 DIAGNOSIS — I2510 Atherosclerotic heart disease of native coronary artery without angina pectoris: Secondary | ICD-10-CM

## 2022-12-31 DIAGNOSIS — Z01810 Encounter for preprocedural cardiovascular examination: Secondary | ICD-10-CM

## 2022-12-31 DIAGNOSIS — I2583 Coronary atherosclerosis due to lipid rich plaque: Secondary | ICD-10-CM | POA: Diagnosis not present

## 2022-12-31 DIAGNOSIS — E7800 Pure hypercholesterolemia, unspecified: Secondary | ICD-10-CM

## 2022-12-31 DIAGNOSIS — I214 Non-ST elevation (NSTEMI) myocardial infarction: Secondary | ICD-10-CM | POA: Diagnosis not present

## 2022-12-31 DIAGNOSIS — I70202 Unspecified atherosclerosis of native arteries of extremities, left leg: Secondary | ICD-10-CM

## 2022-12-31 LAB — CBC
HCT: 32.8 % — ABNORMAL LOW (ref 36.0–46.0)
Hemoglobin: 10.7 g/dL — ABNORMAL LOW (ref 12.0–15.0)
MCH: 29.6 pg (ref 26.0–34.0)
MCHC: 32.6 g/dL (ref 30.0–36.0)
MCV: 90.6 fL (ref 80.0–100.0)
Platelets: 189 10*3/uL (ref 150–400)
RBC: 3.62 MIL/uL — ABNORMAL LOW (ref 3.87–5.11)
RDW: 13.2 % (ref 11.5–15.5)
WBC: 8.1 10*3/uL (ref 4.0–10.5)
nRBC: 0 % (ref 0.0–0.2)

## 2022-12-31 LAB — BASIC METABOLIC PANEL
Anion gap: 9 (ref 5–15)
BUN: 20 mg/dL (ref 8–23)
CO2: 19 mmol/L — ABNORMAL LOW (ref 22–32)
Calcium: 9 mg/dL (ref 8.9–10.3)
Chloride: 107 mmol/L (ref 98–111)
Creatinine, Ser: 1.14 mg/dL — ABNORMAL HIGH (ref 0.44–1.00)
GFR, Estimated: 54 mL/min — ABNORMAL LOW (ref >60)
Glucose, Bld: 291 mg/dL — ABNORMAL HIGH (ref 70–99)
Potassium: 3.8 mmol/L (ref 3.5–5.1)
Sodium: 135 mmol/L (ref 135–145)

## 2022-12-31 LAB — GLUCOSE, CAPILLARY
Glucose-Capillary: 221 mg/dL — ABNORMAL HIGH (ref 70–99)
Glucose-Capillary: 243 mg/dL — ABNORMAL HIGH (ref 70–99)
Glucose-Capillary: 176 mg/dL — ABNORMAL HIGH (ref 70–99)
Glucose-Capillary: 277 mg/dL — ABNORMAL HIGH (ref 70–99)

## 2022-12-31 LAB — BLOOD GAS, ARTERIAL
Acid-base deficit: 5.2 mmol/L — ABNORMAL HIGH (ref 0.0–2.0)
Bicarbonate: 18.9 mmol/L — ABNORMAL LOW (ref 20.0–28.0)
O2 Saturation: 98.7 %
Patient temperature: 37
pCO2 arterial: 32 mmHg (ref 32–48)
pH, Arterial: 7.38 (ref 7.35–7.45)
pO2, Arterial: 104 mmHg (ref 83–108)

## 2022-12-31 LAB — APTT
aPTT: 113 seconds — ABNORMAL HIGH (ref 24–36)
aPTT: 88 seconds — ABNORMAL HIGH (ref 24–36)
aPTT: 87 seconds — ABNORMAL HIGH (ref 24–36)

## 2022-12-31 LAB — URINALYSIS, ROUTINE W REFLEX MICROSCOPIC
Bilirubin Urine: NEGATIVE
Glucose, UA: 50 mg/dL — AB
Hgb urine dipstick: NEGATIVE
Ketones, ur: NEGATIVE mg/dL
Leukocytes,Ua: NEGATIVE
Nitrite: NEGATIVE
Protein, ur: NEGATIVE mg/dL
Specific Gravity, Urine: 1.009 (ref 1.005–1.030)
pH: 6 (ref 5.0–8.0)

## 2022-12-31 LAB — HEMOGLOBIN A1C
Hgb A1c MFr Bld: 8.6 % — ABNORMAL HIGH (ref 4.8–5.6)
Mean Plasma Glucose: 200.12 mg/dL

## 2022-12-31 LAB — PROTIME-INR
INR: 1 (ref 0.8–1.2)
Prothrombin Time: 13.8 seconds (ref 11.4–15.2)

## 2022-12-31 LAB — TYPE AND SCREEN: ABO/RH(D): A POS

## 2022-12-31 LAB — HEPARIN LEVEL (UNFRACTIONATED): Heparin Unfractionated: 0.78 IU/mL — ABNORMAL HIGH (ref 0.30–0.70)

## 2022-12-31 MED ORDER — POTASSIUM CHLORIDE 2 MEQ/ML IV SOLN
80.0000 meq | INTRAVENOUS | Status: DC
  Filled 2022-12-31: qty 40

## 2022-12-31 MED ORDER — INSULIN REGULAR(HUMAN) IN NACL 100-0.9 UT/100ML-% IV SOLN
INTRAVENOUS | Status: AC
  Administered 2023-01-01: 8 [IU]/h via INTRAVENOUS
  Filled 2022-12-31: qty 100

## 2022-12-31 MED ORDER — TRANEXAMIC ACID 1000 MG/10ML IV SOLN
1.5000 mg/kg/h | INTRAVENOUS | Status: AC
  Administered 2023-01-01: 1.5 mg/kg/h via INTRAVENOUS
  Filled 2022-12-31: qty 25

## 2022-12-31 MED ORDER — CEFAZOLIN SODIUM-DEXTROSE 2-4 GM/100ML-% IV SOLN
2.0000 g | INTRAVENOUS | Status: DC
  Filled 2022-12-31: qty 100

## 2022-12-31 MED ORDER — NITROGLYCERIN IN D5W 200-5 MCG/ML-% IV SOLN
2.0000 ug/min | INTRAVENOUS | Status: DC
  Filled 2022-12-31: qty 250

## 2022-12-31 MED ORDER — HEPARIN 30,000 UNITS/1000 ML (OHS) CELLSAVER SOLUTION
Status: AC
  Administered 2023-01-01: 29000
  Filled 2022-12-31: qty 1000

## 2022-12-31 MED ORDER — MANNITOL 20 % IV SOLN
INTRAVENOUS | Status: DC
  Filled 2022-12-31: qty 13

## 2022-12-31 MED ORDER — METOPROLOL TARTRATE 12.5 MG HALF TABLET
12.5000 mg | ORAL_TABLET | Freq: Two times a day (BID) | ORAL | Status: DC
  Administered 2022-12-31 – 2023-01-01 (×3): 12.5 mg via ORAL
  Filled 2022-12-31 (×3): qty 1

## 2022-12-31 MED ORDER — ALPRAZOLAM 0.5 MG PO TABS
0.5000 mg | ORAL_TABLET | Freq: Once | ORAL | Status: AC
  Administered 2022-12-31: 0.5 mg via ORAL
  Filled 2022-12-31: qty 1

## 2022-12-31 MED ORDER — NOREPINEPHRINE 4 MG/250ML-% IV SOLN
0.0000 ug/min | INTRAVENOUS | Status: DC
  Filled 2022-12-31: qty 250

## 2022-12-31 MED ORDER — PHENYLEPHRINE HCL-NACL 20-0.9 MG/250ML-% IV SOLN
30.0000 ug/min | INTRAVENOUS | Status: AC
  Administered 2023-01-01: 10 ug/min via INTRAVENOUS
  Filled 2022-12-31: qty 250

## 2022-12-31 MED ORDER — EPINEPHRINE HCL 5 MG/250ML IV SOLN IN NS
0.0000 ug/min | INTRAVENOUS | Status: DC
  Filled 2022-12-31: qty 250

## 2022-12-31 MED ORDER — VANCOMYCIN HCL 1500 MG/300ML IV SOLN
1500.0000 mg | INTRAVENOUS | Status: AC
  Administered 2023-01-01: 1500 mg via INTRAVENOUS
  Filled 2022-12-31: qty 300

## 2022-12-31 MED ORDER — MILRINONE LACTATE IN DEXTROSE 20-5 MG/100ML-% IV SOLN
0.3000 ug/kg/min | INTRAVENOUS | Status: DC
  Filled 2022-12-31: qty 100

## 2022-12-31 MED ORDER — ICOSAPENT ETHYL 1 G PO CAPS
2.0000 g | ORAL_CAPSULE | Freq: Two times a day (BID) | ORAL | Status: DC
  Administered 2022-12-31 (×2): 2 g via ORAL
  Filled 2022-12-31 (×4): qty 2

## 2022-12-31 MED ORDER — TRANEXAMIC ACID (OHS) PUMP PRIME SOLUTION
2.0000 mg/kg | INTRAVENOUS | Status: DC
  Filled 2022-12-31: qty 1.82

## 2022-12-31 MED ORDER — ATORVASTATIN CALCIUM 80 MG PO TABS
80.0000 mg | ORAL_TABLET | Freq: Every day | ORAL | Status: DC
  Administered 2023-01-02 – 2023-01-11 (×10): 80 mg via ORAL
  Filled 2022-12-31 (×11): qty 1

## 2022-12-31 MED ORDER — TRANEXAMIC ACID (OHS) BOLUS VIA INFUSION
15.0000 mg/kg | INTRAVENOUS | Status: AC
  Administered 2023-01-01: 1363.5 mg via INTRAVENOUS
  Filled 2022-12-31: qty 1364

## 2022-12-31 MED ORDER — DEXMEDETOMIDINE HCL IN NACL 400 MCG/100ML IV SOLN
0.1000 ug/kg/h | INTRAVENOUS | Status: AC
  Administered 2023-01-01: .3 ug/kg/h via INTRAVENOUS
  Filled 2022-12-31: qty 100

## 2022-12-31 MED ORDER — PLASMA-LYTE A IV SOLN
INTRAVENOUS | Status: DC
  Filled 2022-12-31: qty 2.5

## 2022-12-31 MED ORDER — CEFAZOLIN SODIUM-DEXTROSE 2-4 GM/100ML-% IV SOLN
2.0000 g | INTRAVENOUS | Status: AC
  Administered 2023-01-01: 2 g via INTRAVENOUS
  Filled 2022-12-31: qty 100

## 2022-12-31 NOTE — TOC Benefit Eligibility Note (Signed)
Pharmacy Patient Advocate Encounter  Insurance verification completed.    The patient is insured through [employer] for [employer] (Vascepa) 1 g capsules and the current 30 day co-pay is $0.00.   This test claim was processed through [facility]- copay amounts may vary at other pharmacies due to pharmacy/plan contracts, or as the patient moves through the different stages of their insurance plan.    Roland Earl, CPHT Pharmacy Patient Advocate Specialist [facility] Patient Advocate Team Direct Number: [phone]  Fax: [phone]

## 2022-12-31 NOTE — Progress Notes (Signed)
VASCULAR LAB    Pre CABG Dopplers have been performed.  See CV proc for preliminary results.   [facility] Cortez, RVT 12/31/2022, 10:10 AM

## 2022-12-31 NOTE — Progress Notes (Signed)
CARDIAC REHAB PHASE I    Pre-op education including OHS booklet, OHS handout, mobility importance, IS use, home needs at discharge and sternal precautions reviewed. All questions and concerns addressed. Will continue to follow.    [id number]  Woodroe Chen, RN BSN 12/31/2022 2:23 PM

## 2022-12-31 NOTE — Progress Notes (Signed)
Rounding Note    Patient Name: [name] Date of Encounter: 12/31/2022  [facility] Cardiologist: None [facility]   Subjective   Feeling very well today.  No chest pain.  Ambulated yesterday without issues.  Inpatient Medications    Scheduled Meds:  [START ON 01/01/2023] atorvastatin  80 mg Oral Daily   buPROPion  450 mg Oral Daily   icosapent Ethyl  2 g Oral BID   insulin aspart  0-5 Units Subcutaneous QHS   insulin aspart  0-9 Units Subcutaneous TID WC   metoprolol tartrate  12.5 mg Oral BID   sodium chloride flush  3 mL Intravenous Q12H   sodium chloride flush  3 mL Intravenous Q12H   topiramate  50 mg Oral BID   Continuous Infusions:  sodium chloride     heparin 1,650 Units/hr (12/31/22 0710)   nitroGLYCERIN 30 mcg/min (12/30/22 2235)   PRN Meds: sodium chloride, acetaminophen **OR** acetaminophen, albuterol, ondansetron **OR** ondansetron (ZOFRAN) IV, ondansetron (ZOFRAN) IV, oxyCODONE, senna-docusate, sodium chloride flush   Vital Signs    Vitals:   12/30/22 1626 12/31/22 0018 12/31/22 0420 12/31/22 0725  BP: 130/71 101/64 102/69 116/68  Pulse: 82 68  75  Resp: 18 18 18 18   Temp: 98.1 F (36.7 C) 98 F (36.7 C) 97.7 F (36.5 C) 97.6 F (36.4 C)  TempSrc: Oral Oral Oral Oral  SpO2: 99%   100%  Weight:   90.9 kg   Height:        Intake/Output Summary (Last 24 hours) at 12/31/2022 0823 Last data filed at 12/31/2022 0710 Gross per 24 hour  Intake 914.94 ml  Output --  Net 914.94 ml      12/31/2022    4:20 AM 12/30/2022    5:04 AM 12/29/2022    3:30 AM  Last 3 Weights  Weight (lbs) 200 lb 4.8 oz 200 lb 4.8 oz 202 lb 4.8 oz  Weight (kg) 90.855 kg 90.855 kg 91.763 kg      Telemetry    NSR 70s - Personally Reviewed  ECG    No new tracings - Personally Reviewed  Physical Exam   GEN: No acute distress.   Neck: No JVD Cardiac: RRR, no murmurs, rubs, or gallops.  Respiratory: Clear to auscultation bilaterally. GI: Soft,  nontender, non-distended  MS: No edema; No deformity. Neuro:  Nonfocal  Psych: Normal affect   Labs    High Sensitivity Troponin:   Recent Labs  Lab 12/27/22 1618 12/27/22 1810  TROPONINIHS 38* 34*     Chemistry Recent Labs  Lab 12/29/22 0516 12/30/22 0407 12/31/22 0403  NA 137 135 135  K 4.1 3.7 3.8  CL 107 105 107  CO2 22 18* 19*  GLUCOSE 213* 210* 291*  BUN 12 15 20   CREATININE 0.88 0.95 1.14*  CALCIUM 9.0 9.0 9.0  MG 1.3* 1.9  --   PROT 5.8*  --   --   ALBUMIN 3.3*  --   --   AST 19  --   --   ALT 23  --   --   ALKPHOS 41  --   --   BILITOT 0.4  --   --   GFRNONAA >60 >60 54*  ANIONGAP 8 12 9     Lipids  Recent Labs  Lab 12/28/22 0453  CHOL 261*  TRIG 594*  HDL 38*  LDLCALC UNABLE TO CALCULATE IF TRIGLYCERIDE OVER 400 mg/dL  CHOLHDL 6.9    Hematology Recent Labs  Lab 12/29/22  1610 12/30/22 0407 12/31/22 0403  WBC 7.5 7.9 8.1  RBC 3.63* 3.76* 3.62*  HGB 10.7* 11.1* 10.7*  HCT 32.9* 33.6* 32.8*  MCV 90.6 89.4 90.6  MCH 29.5 29.5 29.6  MCHC 32.5 33.0 32.6  RDW 13.7 13.4 13.2  PLT 175 189 189   Thyroid No results for input(s): "TSH", "FREET4" in the last 168 hours.  BNP Recent Labs  Lab 12/27/22 1618  BNP 46.8    DDimer  Recent Labs  Lab 12/27/22 1618  DDIMER <0.27     Radiology    ECHOCARDIOGRAM COMPLETE  Result Date: 12/29/2022    ECHOCARDIOGRAM REPORT   Patient Name:   [name] Date of Exam: 12/29/2022 Medical Rec #:  [id number]            Height:       64.0 in Accession #:    [id number]           Weight:       202.3 lb Date of Birth:  1960/07/31            BSA:          1.966 m Patient Age:    62 years             BP:           117/66 mmHg Patient Gender: F                    HR:           70 bpm. Exam Location:  Inpatient Procedure: 2D Echo, Cardiac Doppler and Color Doppler Indications:    Chest Pain R07.9  History:        Patient has prior history of Echocardiogram examinations. CAD;                 Risk  Factors:Hypertension, Diabetes, Dyslipidemia and                 Non-Smoker.  Sonographer:    Dondra Prader RVT RCS Referring Phys: [id number] [facility] A CHANDRASEKHAR IMPRESSIONS  1. Left ventricular ejection fraction, by estimation, is 60 to 65%. The left ventricle has normal function. The left ventricle has no regional wall motion abnormalities. Left ventricular diastolic parameters were normal.  2. Right ventricular systolic function is normal. The right ventricular size is normal.  3. Left atrial size was mildly dilated.  4. Trivial mitral valve regurgitation.  5. The aortic valve is tricuspid. Aortic valve regurgitation is not visualized. Aortic valve sclerosis is present, with no evidence of aortic valve stenosis.  6. The inferior vena cava is normal in size with greater than 50% respiratory variability, suggesting right atrial pressure of 3 mmHg. FINDINGS  Left Ventricle: Left ventricular ejection fraction, by estimation, is 60 to 65%. The left ventricle has normal function. The left ventricle has no regional wall motion abnormalities. The left ventricular internal cavity size was normal in size. There is  no left ventricular hypertrophy. Left ventricular diastolic parameters were normal. Right Ventricle: The right ventricular size is normal. Right vetricular wall thickness was not assessed. Right ventricular systolic function is normal. Left Atrium: Left atrial size was mildly dilated. Right Atrium: Right atrial size was normal in size. Pericardium: There is no evidence of pericardial effusion. Mitral Valve: Mild mitral annular calcification. Trivial mitral valve regurgitation. Tricuspid Valve: The tricuspid valve is normal in structure. Tricuspid valve regurgitation is trivial. Aortic Valve: The aortic valve is tricuspid. Aortic valve regurgitation is not  visualized. Aortic valve sclerosis is present, with no evidence of aortic valve stenosis. Aortic valve mean gradient measures 10.5 mmHg. Aortic valve peak  gradient measures 19.9 mmHg. Aortic valve area, by VTI measures 2.10 cm. Pulmonic Valve: The pulmonic valve was not well visualized. Pulmonic valve regurgitation is not visualized. Aorta: The aortic root and ascending aorta are structurally normal, with no evidence of dilitation. Venous: The inferior vena cava is normal in size with greater than 50% respiratory variability, suggesting right atrial pressure of 3 mmHg. IAS/Shunts: No atrial level shunt detected by color flow Doppler.  LEFT VENTRICLE PLAX 2D LVIDd:         4.70 cm   Diastology LVIDs:         3.00 cm   LV e' medial:    7.72 cm/s LV PW:         1.00 cm   LV E/e' medial:  14.4 LV IVS:        0.90 cm   LV e' lateral:   10.80 cm/s LVOT diam:     2.00 cm   LV E/e' lateral: 10.3 LV SV:         94 LV SV Index:   48 LVOT Area:     3.14 cm  RIGHT VENTRICLE             IVC RV Basal diam:  2.65 cm     IVC diam: 1.90 cm RV Mid diam:    2.40 cm RV S prime:     15.90 cm/s TAPSE (M-mode): 2.2 cm LEFT ATRIUM             Index        RIGHT ATRIUM           Index LA diam:        3.70 cm 1.88 cm/m   RA Area:     10.20 cm LA Vol (A2C):   62.2 ml 31.64 ml/m  RA Volume:   21.50 ml  10.94 ml/m LA Vol (A4C):   67.6 ml 34.39 ml/m LA Biplane Vol: 65.8 ml 33.47 ml/m  AORTIC VALVE                     PULMONIC VALVE AV Area (Vmax):    2.06 cm      PV Vmax:       1.21 m/s AV Area (Vmean):   2.09 cm      PV Peak grad:  5.9 mmHg AV Area (VTI):     2.10 cm AV Vmax:           223.00 cm/s AV Vmean:          151.500 cm/s AV VTI:            0.448 m AV Peak Grad:      19.9 mmHg AV Mean Grad:      10.5 mmHg LVOT Vmax:         146.00 cm/s LVOT Vmean:        101.000 cm/s LVOT VTI:          0.300 m LVOT/AV VTI ratio: 0.67  AORTA Ao Root diam: 2.70 cm Ao Asc diam:  3.40 cm MITRAL VALVE MV Area (PHT): 3.08 cm     SHUNTS MV Decel Time: 246 msec     Systemic VTI:  0.30 m MV E velocity: 111.00 cm/s  Systemic Diam: 2.00 cm MV A velocity: 112.00 cm/s MV E/A ratio:  0.99 Dietrich Pates MD  Electronically signed by Dietrich Pates MD Signature Date/Time: 12/29/2022/11:50:02 AM    Final     Cardiac Studies   Echocardiogram 12/29/2022  1. Left ventricular ejection fraction, by estimation, is 60 to 65%. The  left ventricle has normal function. The left ventricle has no regional  wall motion abnormalities. Left ventricular diastolic parameters were  normal.   2. Right ventricular systolic function is normal. The right ventricular  size is normal.   3. Left atrial size was mildly dilated.   4. Trivial mitral valve regurgitation.   5. The aortic valve is tricuspid. Aortic valve regurgitation is not  visualized. Aortic valve sclerosis is present, with no evidence of aortic  valve stenosis.   6. The inferior vena cava is normal in size with greater than 50%  respiratory variability, suggesting right atrial pressure of 3 mmHg.   Left heart catheterization 12/28/2022 Left Anterior Descending  Mid LAD lesion is 99% stenosed.    First Diagonal Branch  1st Diag lesion is 70% stenosed.    Ramus Intermedius  Vessel is small. There is moderate diffuse disease throughout the vessel.    Left Circumflex  Prox Cx lesion is 75% stenosed.    Right Coronary Artery  Mid RCA lesion is 100% stenosed.    Right Posterior Descending Artery  Collaterals  RPDA filled by collaterals from RV Branch.      Third Right Posterolateral Branch  Collaterals  3rd RPL filled by collaterals from 3rd Mrg.    Collaterals  3rd RPL filled by collaterals from 3rd Mrg.      Patient Profile     62 y.o. female hypertension, type 2 diabetes mellitus, dyslipidemia, and aspirin allergy.  Underwent cardiac catheterization on 12/28/2022 noting multivessel disease.  CT surgery following for plans for CABG reported to be on Tuesday.  Assessment & Plan    CAD with multivessel disease Initially presented with chest pain associated with nausea and diaphoresis, hypotensive.  Troponins minimally elevated 38-34.  EKG  no ischemic changes.  Cardiac catheterization showed 99% stenosis mid LAD, first diagonal 70% stenosis, prox circumflex 75% stenosis, mid RCA CTO. Aspirin allergy with anaphylaxis Starting Lopressor 12.5 mg twice daily, uptitrating atorvastatin to 80 mg, adding vascepa 2g BID.  Continue IV heparin and nitroglycerin drip.  No chest pain currently. Echocardiogram 60 to 65% with no regional wall motion abnormalities.  Hyperlipidemia See above  Type 2 diabetes mellitus SSI here. Resume metformin >48 hrs post cath or at discharge.    For questions or updates, please contact [facility] Please consult [email or web address] for contact info under        Signed, Abagail Kitchens, PA-C  12/31/2022, 8:23 AM

## 2022-12-31 NOTE — Progress Notes (Addendum)
I was called at the bedside due to sudden onset of chest pain however predominantly right arm pain with numbness and tingling.  Patient states that since about 1430 her arm has been feeling numb and tingling intermittently however had an episode where this persisted and got worse after receiving nitroglycerin.  Otherwise patient appears stable and not diaphoretic or with any respiratory complaints. Vital signs stable, EKG was obtained that showed no acute ischemic changes.  Cath site was assessed and free of acute complications.  Given sudden onset of neuropathy and arm weakness neurology was called stat.  However, during evaluation patient became very tearful and agitated when discussing upcoming CABG surgery and difficulties with previous admissions that had ignored her current symptoms.  Suspect current symptoms might be attributed to anxiety.  Will give Xanax .5 mg and continue to provide nitroglycerin as needed.  MD notified.

## 2022-12-31 NOTE — Inpatient Diabetes Management (Addendum)
Inpatient Diabetes Program Recommendations  AACE/ADA: New Consensus Statement on Inpatient Glycemic Control (2015)  Target Ranges:  Prepandial:   less than 140 mg/dL      Peak postprandial:   less than 180 mg/dL (1-2 hours)      Critically ill patients:  140 - 180 mg/dL    Latest Reference Range & Units 11/26/22 16:17  HB A1C (BAYER DCA - WAIVED) 4.8 - 5.6 % 9.1 (H)  (H): Data is abnormally high  Latest Reference Range & Units 12/30/22 07:21 12/30/22 11:16 12/30/22 16:29 12/30/22 21:32  Glucose-Capillary 70 - 99 mg/dL 657 (H)  3 units Novolog  275 (H)  5 units Novolog  217 (H) 274 (H)  2 units Novolog   (H): Data is abnormally high  Latest Reference Range & Units 12/31/22 07:00  Glucose-Capillary 70 - 99 mg/dL 846 (H)  3 units Novolog   (H): Data is abnormally high    Admit with: Chest pain in the setting of unstable angina, severe multivessel coronary artery disease   History: DM2  Home DM Meds: Glipizide XL 10 mg Daily        Metformin 500 mg BID        Rybelsus 7 mg Daily  Current Orders: Novolog Sensitive Correction Scale/ SSI (0-9 units) TID AC + HS    Saw PCP 11/26/2022 [facility]  Needs CABG per notes    MD- Note AM CBGs >200 the last 2 days.  Please consider:  1. Start Semglee 10 units Daily (0.1 units/kg)  2. Start Novolog Meal Coverage: Novolog 4 units TID with meals HOLD if pt NPO HOLD if pt eats <50% meals      Addendum 3:30pm--Asked pharmacy to check coverage on Freestyle Libre 3 CGM.  FSL3 not covered but Dexcom G7 CGM is covered at $0 co-pay.  Will provide sample at d/c with MD permission.   Addendum 3pm--Met w/ pt at bedside.  Pt told me she does not have CBG meter at home.  Is interested in [place] 3 CGM--Have sent request to OP pharmacy to see if FSL 3 is covered.  We reviewed her current CBGs, plan for IV Insulin Drip during/after surgery, home DM meds, goal CBGs for home.  Pt told me she was  frustrated her A1c was in the 9% range--Told me her A1c is usually around 7%.  Has all meds at home and gets samples of the Rybelsus from her PCP.  Takes meds as prescribed.  We talked about how her heart condition is likely causing hyperglycemia and how her CBGs will hopefully improve after surgery.  Reminded patient that her goal A1c is 7% or less per ADA standards to prevent both acute and long-term complications.  Reviewed goal CBGs for home.  Explained to patient the extreme importance of good glucose control at home especially post-op to protect her new bypassed vessels and to prevent other long term complications.  Encouraged patient to check her CBGs at least tid ac at home and to record all CBGs in a logbook for his/her PCP to review.      --Will follow patient during hospitalization--  Ambrose Finland RN, MSN, CDCES Diabetes Coordinator Inpatient Glycemic Control Team Team Pager: [phone] (8a-5p)

## 2022-12-31 NOTE — Progress Notes (Addendum)
PROGRESS NOTE  [name] ZOX:[id number] DOB: 1960-09-10 DOA: 12/27/2022 PCP: Bennie Pierini, FNP   LOS: 4 days   Brief Narrative / Interim history: 62 y.o. female with medical history significant for T2DM, HTN, HLD, depression/anxiety who presented to the ED for evaluation of chest pain and near syncopal episode. Patient states that for the last week she has been having intermittent chest pain.  This is described as pressure-like discomfort across her chest, to her back, and down her left arm.  This is worse with exertion.  She says normally walks her dogs 1.5 miles however since symptoms started she can barely walk a block before her symptoms occur.  Symptoms are associated with shortness of breath.  She has had the symptoms occasionally while at rest as well. She also reports nausea. She was seen by [facility] cardiology 12/24/2022 who recommended outpatient stress testing and echocardiogram, not yet performed. On the day of admission she was at a restaurant with friends when she suddenly became diaphoretic and nauseous while she was sitting down.   Subjective / 24h Interval events: No acute issues or events overnight, denies any further episodes of nausea vomiting diarrhea constipation any fevers chills chest pain or shortness of breath  Assesement and Plan: Principal Problem:   Chest pain Active Problems:   Hypotension   AKI (acute kidney injury) (HCC)   Type 2 diabetes mellitus (HCC)   Hyperlipidemia associated with type 2 diabetes mellitus (HCC)   *Transient, intermittent arm numbness/tingling Rule out TIA Rule out panic attack -Neuro sidelined - unlikely to be stroke given presentation but MRI to rule out any acute findings was agreeable. -Worse after nitroglycerin -Cath access site unremarkable -Trial xanax per cardiology - follow symptoms  Chest pain in the setting of unstable angina, severe multivessel coronary artery disease -Cardiology following -Cardiac cath  7/19 shows severe multivessel disease LVEDP 13 mmHg -Tentatively scheduled CABG pending pre-op workup per cardiothoracic surgery - plan for Surgery 7/23 -Asa allergy with anaphylaxis reported  Hypotension with history of hypertension -Questionably secondary to above   Acute kidney injury, resolving -Likely secondary to hypotension as above   Type 2 diabetes, un controlled, with hyperglycemia  -Uncontrolled with A1c 9.1 -Continue sliding scale insulin, hypoglycemic protocol diabetic diet -Hold home glycemic medications   Hyperlipidemia - Continue atorvastatin. Lipid panel shows poorly controled profile   Depression/anxiety - Continue Wellbutrin. Code status: *Patient to be made full code at time of surgery - will re-address post operatively during recovery  Family Communication: no family at bedside   Status is: Inpatient  Remains inpatient appropriate because: severity of illness  Level of care: Progressive  Consultants:  Cardiology   Objective: Vitals:   12/30/22 1626 12/31/22 0018 12/31/22 0420 12/31/22 0725  BP: 130/71 101/64 102/69 116/68  Pulse: 82 68  75  Resp: 18 18 18 18   Temp: 98.1 F (36.7 C) 98 F (36.7 C) 97.7 F (36.5 C) 97.6 F (36.4 C)  TempSrc: Oral Oral Oral Oral  SpO2: 99%   100%  Weight:   90.9 kg   Height:        Intake/Output Summary (Last 24 hours) at 12/31/2022 0745 Last data filed at 12/31/2022 0000 Gross per 24 hour  Intake 726 ml  Output --  Net 726 ml   Wt Readings from Last 3 Encounters:  12/31/22 90.9 kg  11/26/22 93 kg  08/21/22 91.2 kg    Examination:  Constitutional: NAD Eyes: no scleral icterus ENMT: Mucous membranes are moist.  Neck:  normal, supple Respiratory: clear to auscultation bilaterally, no wheezing, no crackles.  Cardiovascular: Regular rate and rhythm, no murmurs / rubs / gallops.  Abdomen: non distended, no tenderness. Bowel sounds positive.  Musculoskeletal: no clubbing / cyanosis.   Data Reviewed: I  have independently reviewed following labs and imaging studies   CBC Recent Labs  Lab 12/27/22 1618 12/27/22 1635 12/28/22 0453 12/29/22 0516 12/30/22 0407 12/31/22 0403  WBC 10.5  --  8.6 7.5 7.9 8.1  HGB 11.8* 11.6* 10.4* 10.7* 11.1* 10.7*  HCT 36.4 34.0* 32.0* 32.9* 33.6* 32.8*  PLT 204  --  191 175 189 189  MCV 93.1  --  92.8 90.6 89.4 90.6  MCH 30.2  --  30.1 29.5 29.5 29.6  MCHC 32.4  --  32.5 32.5 33.0 32.6  RDW 13.6  --  13.8 13.7 13.4 13.2  LYMPHSABS 2.0  --   --   --   --   --   MONOABS 0.6  --   --   --   --   --   EOSABS 0.1  --   --   --   --   --   BASOSABS 0.1  --   --   --   --   --     Recent Labs  Lab 12/27/22 1618 12/27/22 1635 12/28/22 0453 12/29/22 0516 12/30/22 0407 12/31/22 0403  NA 135 138 134* 137 135 135  K 3.8 3.9 4.0 4.1 3.7 3.8  CL 108 110 108 107 105 107  CO2 17*  --  16* 22 18* 19*  GLUCOSE 294* 296* 262* 213* 210* 291*  BUN 27* 25* 23 12 15 20   CREATININE 1.19* 1.20* 0.96 0.88 0.95 1.14*  CALCIUM 8.8*  --  8.9 9.0 9.0 9.0  AST  --   --   --  19  --   --   ALT  --   --   --  23  --   --   ALKPHOS  --   --   --  41  --   --   BILITOT  --   --   --  0.4  --   --   ALBUMIN  --   --   --  3.3*  --   --   MG  --   --   --  1.3* 1.9  --   DDIMER <0.27  --   --   --   --   --   BNP 46.8  --   --   --   --   --     ------------------------------------------------------------------------------------------------------------------ No results for input(s): "CHOL", "HDL", "LDLCALC", "TRIG", "CHOLHDL", "LDLDIRECT" in the last 72 hours.   Lab Results  Component Value Date   HGBA1C 9.1 (H) 11/26/2022   ------------------------------------------------------------------------------------------------------------------ No results for input(s): "TSH", "T4TOTAL", "T3FREE", "THYROIDAB" in the last 72 hours.  Invalid input(s): "FREET3"  Cardiac Enzymes No results for input(s): "CKMB", "TROPONINI", "MYOGLOBIN" in the last 168 hours.  Invalid  input(s): "CK" ------------------------------------------------------------------------------------------------------------------    Component Value Date/Time   BNP 46.8 12/27/2022 1618    CBG: Recent Labs  Lab 12/30/22 0721 12/30/22 1116 12/30/22 1629 12/30/22 2132 12/31/22 0700  GLUCAP 243* 275* 217* 274* 221*    Recent Results (from the past 240 hour(s))  SARS Coronavirus 2 by RT PCR (hospital order, performed in [facility] hospital lab) *cepheid single result test* Anterior Nasal Swab     Status: None   Collection  Time: 12/27/22  4:18 PM   Specimen: Anterior Nasal Swab  Result Value Ref Range Status   SARS Coronavirus 2 by RT PCR NEGATIVE NEGATIVE Final    Comment: (NOTE) SARS-CoV-2 target nucleic acids are NOT DETECTED.  The SARS-CoV-2 RNA is generally detectable in upper and lower respiratory specimens during the acute phase of infection. The lowest concentration of SARS-CoV-2 viral copies this assay can detect is 250 copies / mL. A negative result does not preclude SARS-CoV-2 infection and should not be used as the sole basis for treatment or other patient management decisions.  A negative result may occur with improper specimen collection / handling, submission of specimen other than nasopharyngeal swab, presence of viral mutation(s) within the areas targeted by this assay, and inadequate number of viral copies (<250 copies / mL). A negative result must be combined with clinical observations, patient history, and epidemiological information.  Fact Sheet for Patients:   RoadLapTop.co.za  Fact Sheet for Healthcare Providers: http://kim-miller.com/  This test is not yet approved or  cleared by the [place] FDA and has been authorized for detection and/or diagnosis of SARS-CoV-2 by FDA under an Emergency Use Authorization (EUA).  This EUA will remain in effect (meaning this test can be used) for the duration of  the COVID-19 declaration under Section 564(b)(1) of the Act, 21 U.S.C. section 360bbb-3(b)(1), unless the authorization is terminated or revoked sooner.  Performed at [facility], [address], [place], [place] [address]      Radiology Studies: No results found.  Carma Leaven DO [facility]  Between 7 pm - 7 am I am not available, please contact night coverage MD/APP via Amion

## 2022-12-31 NOTE — TOC Benefit Eligibility Note (Signed)
Pharmacy Patient Advocate Encounter  Insurance verification completed.    The patient is insured through [employer] for [employer] and the current 30 day co-pay is $0.00.  Ran test claim for [employer] and Non Formulary   This test claim was processed through [employer]- copay amounts may vary at other pharmacies due to [employer], or as the patient moves through the different stages of their insurance plan.    Roland Earl, CPHT Pharmacy Patient Advocate Specialist [facility] Patient Advocate Team Direct Number: [phone]  Fax: [phone]

## 2022-12-31 NOTE — Progress Notes (Signed)
Pt had episode of chest pain 7/10 around 1430 and pain increased nitroglycerin drip to 35 mcg from 30 mcg and pain subsided. Again around 1500, pt c/o sharp chest pain 9/10 with right arm numbness/pain. Obtained EKG and titrated nitroglycerin up to 45 mcg. Paged cardiologist. Dr. Mayford Knife evaluated her and ordered to contact neurologist to r/o stroke and 0.5 mg of xanax since her pain could possibly caused by her anxiety.  Stroke team came and assessed her. Pt is not showing any symptom of neuro deficit. Received order for MRI of brain.  Pt agreed with the orders.  Hinton Dyer, RN

## 2022-12-31 NOTE — Progress Notes (Signed)
      [address]       [place] [address]             [phone]      Ms. Salih said she feels good this morning with no chest pain or shortness of breath.  Heparin and nitroglycerine are infusing.  We discussed plans for surgery tomorrow afternoon by  Dr. Cliffton Asters.    She mentioned that she is currently designated DNR code status  and asked to have this changed to full code. We agree DNR  is not appropriate prior to a procedure requiring cardiac arrest and cardiopulmonary bypass. I discussed this with the unit charge nurse who said she will help her get the code status changed.   Gaynelle Arabian,  PA-C [phone]

## 2022-12-31 NOTE — Progress Notes (Addendum)
ANTICOAGULATION CONSULT NOTE- Follow Up  Pharmacy Consult for Heparin Indication:  severe multivessel CAD  Allergies  Allergen Reactions   Aspirin Anaphylaxis   Bee Venom Anaphylaxis   Benadryl [Diphenhydramine Hcl] Anaphylaxis   Morphine And Codeine Shortness Of Breath and Itching   Vicks Formula 44 Cough-Cold Pm [Dm-Apap-Cpm] Shortness Of Breath and Rash    Not anaphylaxis    Patient Measurements: Height: 5\' 4"  (162.6 cm) Weight: 90.9 kg (200 lb 4.8 oz) IBW/kg (Calculated) : 54.7 Heparin Dosing Weight: 75 kg  Vital Signs: Temp: 98.1 F (36.7 C) (07/22 1150) Temp Source: Oral (07/22 1150) BP: 119/68 (07/22 1518) Pulse Rate: 66 (07/22 1518)  Labs: Recent Labs    12/29/22 0516 12/29/22 0656 12/29/22 1423 12/30/22 0407 12/30/22 1455 12/30/22 2225 12/31/22 0403 12/31/22 1323 12/31/22 1429  HGB 10.7*  --   --  11.1*  --   --  10.7*  --   --   HCT 32.9*  --   --  33.6*  --   --  32.8*  --   --   PLT 175  --   --  189  --   --  189  --   --   APTT  --   --   --  63*   < > 84* 113*  --  88*  HEPARINUNFRC  --    < > 0.28* 0.51  --   --   --  0.78*  --   CREATININE 0.88  --   --  0.95  --   --  1.14*  --   --    < > = values in this interval not displayed.    Estimated Creatinine Clearance: 55.9 mL/min (A) (by C-G formula based on SCr of 1.14 mg/dL (H)).   Medical History: Past Medical History:  Diagnosis Date   Abdominal discomfort    Chronic abdominal and pelvic pain resulting in significant loss of time from work   Anxiety    with depression   Asthma    Chest pain    Depression    Diabetes mellitus    Drug overdose 06/12/2007   60 Naprosyn, psychiatric admission   Hyperlipidemia    Severe   Hypertension    Migraines    Tremor of both hands    on propranolol    Medications:  Medications Prior to Admission  Medication Sig Dispense Refill Last Dose   albuterol (VENTOLIN HFA) 108 (90 Base) MCG/ACT inhaler Inhale 2 puffs into the lungs every 4 (four)  hours as needed for wheezing or shortness of breath. (NEEDS TO BE SEEN) 18 g 1 unk   atorvastatin (LIPITOR) 40 MG tablet Take 40 mg by mouth daily.   12/27/2022   benazepril (LOTENSIN) 20 MG tablet Take 1 tablet (20 mg total) by mouth daily. 90 tablet 1 12/27/2022   buPROPion (WELLBUTRIN XL) 150 MG 24 hr tablet TAKE 3 TABLETS BY MOUTH DAILY. 270 tablet 0 12/27/2022   EPINEPHrine 0.3 mg/0.3 mL IJ SOAJ injection Inject 0.3 mg into the muscle as needed for anaphylaxis. 2 each 2 unk   fenofibrate 160 MG tablet Take 1 tablet (160 mg total) by mouth daily. 90 tablet 1 12/27/2022   glipiZIDE (GLUCOTROL XL) 10 MG 24 hr tablet Take 1 tablet (10 mg total) by mouth daily. 180 tablet 1 12/26/2022   lisinopril (ZESTRIL) 10 MG tablet Take 1 tablet (10 mg total) by mouth daily. 90 tablet 1 12/26/2022   metFORMIN (GLUCOPHAGE) 500 MG tablet  Take 1 tablet (500 mg total) by mouth 2 (two) times daily with a meal. 180 tablet 1 12/27/2022 at am   propranolol (INDERAL) 40 MG tablet Take 1 tablet (40 mg total) by mouth 2 (two) times daily. (Needs to be seen before next refill) 180 tablet 1 Past Week   rizatriptan (MAXALT) 10 MG tablet Take 1 tablet (10 mg total) by mouth as needed for migraine. May repeat in 2 hours if needed 10 tablet 2 unk   RYBELSUS 7 MG TABS TAKE 1 TABLET (7 MG TOTAL) BY MOUTH DAILY 90 tablet 1 12/27/2022   topiramate (TOPAMAX) 50 MG tablet Take 1 tablet (50 mg total) by mouth 2 (two) times daily. 180 tablet 1 12/27/2022    Assessment: 62 y.o. F presented with chest pain. Found to have severe multivessel disease on cath. Heparin was started 2 hours post TR band removal. TR band removed 7/19 2100, for CVTS consult. Noted that patient has elevated triglycerides, which could alter heparin anti-Xa level results. Assessed both levels on 7/21 to determine the correlation.   Will rely on aPTT results for dose adjustments in the setting of elevated triglycerides.    7/22 PM: aPTT 88- therapeutic on 1650 units/h. No  signs/symptoms of bleeding reported or issues with the infusion. Will continue same rate  Goal of Therapy:  aPTT 66-102 seconds Monitor platelets by anticoagulation protocol: Yes   Plan:  Continue heparin gtt at 1650 units/hr F/u aPTT level in 6 hours to confirm therapeutic Monitor daily aPTT and CBC; monitor for s/sx of bleeding  Loralee Pacas, PharmD, BCPS 12/31/2022 4:05 PM   Please check AMION for all [facility] Pharmacy phone numbers After 10:00 PM, call Main Pharmacy [phone]  Addendum: confirmatory aPTT check 87 is therapeutic. Continue same rate and f/u AM labs.  Loralee Pacas, PharmD, BCPS 12/31/2022 9:53 PM

## 2023-01-01 ENCOUNTER — Inpatient Hospital Stay (HOSPITAL_COMMUNITY): Payer: 59 | Admitting: Certified Registered Nurse Anesthetist

## 2023-01-01 ENCOUNTER — Other Ambulatory Visit: Payer: Self-pay

## 2023-01-01 ENCOUNTER — Inpatient Hospital Stay (HOSPITAL_COMMUNITY): Payer: 59

## 2023-01-01 ENCOUNTER — Inpatient Hospital Stay (HOSPITAL_COMMUNITY)
Admission: EM | Disposition: A | Discharge status: Home / Self Care | Payer: Self-pay | Source: Home / Self Care | Attending: Thoracic Surgery (Cardiothoracic Vascular Surgery)

## 2023-01-01 DIAGNOSIS — Z951 Presence of aortocoronary bypass graft: Secondary | ICD-10-CM | POA: Diagnosis not present

## 2023-01-01 DIAGNOSIS — J45909 Unspecified asthma, uncomplicated: Secondary | ICD-10-CM

## 2023-01-01 DIAGNOSIS — J9589 Other postprocedural complications and disorders of respiratory system, not elsewhere classified: Secondary | ICD-10-CM | POA: Diagnosis not present

## 2023-01-01 DIAGNOSIS — I10 Essential (primary) hypertension: Secondary | ICD-10-CM

## 2023-01-01 DIAGNOSIS — R079 Chest pain, unspecified: Secondary | ICD-10-CM | POA: Diagnosis not present

## 2023-01-01 DIAGNOSIS — I214 Non-ST elevation (NSTEMI) myocardial infarction: Secondary | ICD-10-CM | POA: Diagnosis not present

## 2023-01-01 DIAGNOSIS — I25110 Atherosclerotic heart disease of native coronary artery with unstable angina pectoris: Secondary | ICD-10-CM

## 2023-01-01 DIAGNOSIS — I2583 Coronary atherosclerosis due to lipid rich plaque: Secondary | ICD-10-CM | POA: Diagnosis not present

## 2023-01-01 DIAGNOSIS — I2510 Atherosclerotic heart disease of native coronary artery without angina pectoris: Secondary | ICD-10-CM | POA: Diagnosis not present

## 2023-01-01 DIAGNOSIS — E7800 Pure hypercholesterolemia, unspecified: Secondary | ICD-10-CM | POA: Diagnosis not present

## 2023-01-01 HISTORY — PX: CORONARY ARTERY BYPASS GRAFT: SHX141

## 2023-01-01 HISTORY — PX: TEE WITHOUT CARDIOVERSION: SHX5443

## 2023-01-01 LAB — POCT I-STAT 7, (LYTES, BLD GAS, ICA,H+H)
Acid-base deficit: 7 mmol/L — ABNORMAL HIGH (ref 0.0–2.0)
Acid-base deficit: 8 mmol/L — ABNORMAL HIGH (ref 0.0–2.0)
Acid-base deficit: 3 mmol/L — ABNORMAL HIGH (ref 0.0–2.0)
Acid-base deficit: 4 mmol/L — ABNORMAL HIGH (ref 0.0–2.0)
Acid-base deficit: 8 mmol/L — ABNORMAL HIGH (ref 0.0–2.0)
Acid-base deficit: 7 mmol/L — ABNORMAL HIGH (ref 0.0–2.0)
Bicarbonate: 17.5 mmol/L — ABNORMAL LOW (ref 20.0–28.0)
Bicarbonate: 19 mmol/L — ABNORMAL LOW (ref 20.0–28.0)
Bicarbonate: 19.9 mmol/L — ABNORMAL LOW (ref 20.0–28.0)
Bicarbonate: 22.7 mmol/L (ref 20.0–28.0)
Bicarbonate: 18.5 mmol/L — ABNORMAL LOW (ref 20.0–28.0)
Bicarbonate: 18.8 mmol/L — ABNORMAL LOW (ref 20.0–28.0)
Calcium, Ion: 1.1 mmol/L — ABNORMAL LOW (ref 1.15–1.40)
Calcium, Ion: 1.14 mmol/L — ABNORMAL LOW (ref 1.15–1.40)
Calcium, Ion: 1.04 mmol/L — ABNORMAL LOW (ref 1.15–1.40)
Calcium, Ion: 1.3 mmol/L (ref 1.15–1.40)
Calcium, Ion: 1.26 mmol/L (ref 1.15–1.40)
Calcium, Ion: 1.13 mmol/L — ABNORMAL LOW (ref 1.15–1.40)
HCT: 20 % — ABNORMAL LOW (ref 36.0–46.0)
HCT: 22 % — ABNORMAL LOW (ref 36.0–46.0)
HCT: 25 % — ABNORMAL LOW (ref 36.0–46.0)
HCT: 26 % — ABNORMAL LOW (ref 36.0–46.0)
HCT: 28 % — ABNORMAL LOW (ref 36.0–46.0)
HCT: 32 % — ABNORMAL LOW (ref 36.0–46.0)
Hemoglobin: 6.8 g/dL — CL (ref 12.0–15.0)
Hemoglobin: 7.5 g/dL — ABNORMAL LOW (ref 12.0–15.0)
Hemoglobin: 8.5 g/dL — ABNORMAL LOW (ref 12.0–15.0)
Hemoglobin: 8.8 g/dL — ABNORMAL LOW (ref 12.0–15.0)
Hemoglobin: 9.5 g/dL — ABNORMAL LOW (ref 12.0–15.0)
Hemoglobin: 10.9 g/dL — ABNORMAL LOW (ref 12.0–15.0)
O2 Saturation: 100 %
O2 Saturation: 100 %
O2 Saturation: 100 %
O2 Saturation: 100 %
O2 Saturation: 98 %
O2 Saturation: 99 %
Patient temperature: 35.7
Patient temperature: 36
Potassium: 4 mmol/L (ref 3.5–5.1)
Potassium: 4.4 mmol/L (ref 3.5–5.1)
Potassium: 4.8 mmol/L (ref 3.5–5.1)
Potassium: 5.2 mmol/L — ABNORMAL HIGH (ref 3.5–5.1)
Potassium: 3.7 mmol/L (ref 3.5–5.1)
Potassium: 3.9 mmol/L (ref 3.5–5.1)
Sodium: 136 mmol/L (ref 135–145)
Sodium: 137 mmol/L (ref 135–145)
Sodium: 139 mmol/L (ref 135–145)
Sodium: 139 mmol/L (ref 135–145)
Sodium: 141 mmol/L (ref 135–145)
Sodium: 143 mmol/L (ref 135–145)
TCO2: 19 mmol/L — ABNORMAL LOW (ref 22–32)
TCO2: 20 mmol/L — ABNORMAL LOW (ref 22–32)
TCO2: 21 mmol/L — ABNORMAL LOW (ref 22–32)
TCO2: 24 mmol/L (ref 22–32)
TCO2: 20 mmol/L — ABNORMAL LOW (ref 22–32)
TCO2: 20 mmol/L — ABNORMAL LOW (ref 22–32)
pCO2 arterial: 33.8 mmHg (ref 32–48)
pCO2 arterial: 38 mmHg (ref 32–48)
pCO2 arterial: 30 mmHg — ABNORMAL LOW (ref 32–48)
pCO2 arterial: 40.5 mmHg (ref 32–48)
pCO2 arterial: 38.8 mmHg (ref 32–48)
pCO2 arterial: 35.8 mmHg (ref 32–48)
pH, Arterial: 7.307 — ABNORMAL LOW (ref 7.35–7.45)
pH, Arterial: 7.323 — ABNORMAL LOW (ref 7.35–7.45)
pH, Arterial: 7.356 (ref 7.35–7.45)
pH, Arterial: 7.43 (ref 7.35–7.45)
pH, Arterial: 7.279 — ABNORMAL LOW (ref 7.35–7.45)
pH, Arterial: 7.323 — ABNORMAL LOW (ref 7.35–7.45)
pO2, Arterial: 262 mmHg — ABNORMAL HIGH (ref 83–108)
pO2, Arterial: 361 mmHg — ABNORMAL HIGH (ref 83–108)
pO2, Arterial: 212 mmHg — ABNORMAL HIGH (ref 83–108)
pO2, Arterial: 257 mmHg — ABNORMAL HIGH (ref 83–108)
pO2, Arterial: 121 mmHg — ABNORMAL HIGH (ref 83–108)
pO2, Arterial: 139 mmHg — ABNORMAL HIGH (ref 83–108)

## 2023-01-01 LAB — POCT I-STAT, CHEM 8
BUN: 22 mg/dL (ref 8–23)
BUN: 22 mg/dL (ref 8–23)
BUN: 18 mg/dL (ref 8–23)
BUN: 18 mg/dL (ref 8–23)
Calcium, Ion: 1.37 mmol/L (ref 1.15–1.40)
Calcium, Ion: 1.4 mmol/L (ref 1.15–1.40)
Calcium, Ion: 1.02 mmol/L — ABNORMAL LOW (ref 1.15–1.40)
Calcium, Ion: 1.29 mmol/L (ref 1.15–1.40)
Chloride: 108 mmol/L (ref 98–111)
Chloride: 109 mmol/L (ref 98–111)
Chloride: 107 mmol/L (ref 98–111)
Chloride: 109 mmol/L (ref 98–111)
Creatinine, Ser: 0.8 mg/dL (ref 0.44–1.00)
Creatinine, Ser: 0.9 mg/dL (ref 0.44–1.00)
Creatinine, Ser: 0.7 mg/dL (ref 0.44–1.00)
Creatinine, Ser: 0.7 mg/dL (ref 0.44–1.00)
Glucose, Bld: 201 mg/dL — ABNORMAL HIGH (ref 70–99)
Glucose, Bld: 210 mg/dL — ABNORMAL HIGH (ref 70–99)
Glucose, Bld: 117 mg/dL — ABNORMAL HIGH (ref 70–99)
Glucose, Bld: 158 mg/dL — ABNORMAL HIGH (ref 70–99)
HCT: 28 % — ABNORMAL LOW (ref 36.0–46.0)
HCT: 29 % — ABNORMAL LOW (ref 36.0–46.0)
HCT: 20 % — ABNORMAL LOW (ref 36.0–46.0)
HCT: 26 % — ABNORMAL LOW (ref 36.0–46.0)
Hemoglobin: 9.5 g/dL — ABNORMAL LOW (ref 12.0–15.0)
Hemoglobin: 9.9 g/dL — ABNORMAL LOW (ref 12.0–15.0)
Hemoglobin: 6.8 g/dL — CL (ref 12.0–15.0)
Hemoglobin: 8.8 g/dL — ABNORMAL LOW (ref 12.0–15.0)
Potassium: 4 mmol/L (ref 3.5–5.1)
Potassium: 4.1 mmol/L (ref 3.5–5.1)
Potassium: 4.1 mmol/L (ref 3.5–5.1)
Potassium: 4.7 mmol/L (ref 3.5–5.1)
Sodium: 136 mmol/L (ref 135–145)
Sodium: 137 mmol/L (ref 135–145)
Sodium: 137 mmol/L (ref 135–145)
Sodium: 138 mmol/L (ref 135–145)
TCO2: 19 mmol/L — ABNORMAL LOW (ref 22–32)
TCO2: 20 mmol/L — ABNORMAL LOW (ref 22–32)
TCO2: 23 mmol/L (ref 22–32)
TCO2: 24 mmol/L (ref 22–32)

## 2023-01-01 LAB — BASIC METABOLIC PANEL
Anion gap: 8 (ref 5–15)
Anion gap: 7 (ref 5–15)
BUN: 26 mg/dL — ABNORMAL HIGH (ref 8–23)
BUN: 16 mg/dL (ref 8–23)
CO2: 19 mmol/L — ABNORMAL LOW (ref 22–32)
CO2: 20 mmol/L — ABNORMAL LOW (ref 22–32)
Calcium: 9.3 mg/dL (ref 8.9–10.3)
Calcium: 7.7 mg/dL — ABNORMAL LOW (ref 8.9–10.3)
Chloride: 106 mmol/L (ref 98–111)
Chloride: 110 mmol/L (ref 98–111)
Creatinine, Ser: 1.22 mg/dL — ABNORMAL HIGH (ref 0.44–1.00)
Creatinine, Ser: 0.91 mg/dL (ref 0.44–1.00)
GFR, Estimated: 50 mL/min — ABNORMAL LOW (ref >60)
GFR, Estimated: >60 mL/min (ref >60)
Glucose, Bld: 261 mg/dL — ABNORMAL HIGH (ref 70–99)
Glucose, Bld: 190 mg/dL — ABNORMAL HIGH (ref 70–99)
Potassium: 3.7 mmol/L (ref 3.5–5.1)
Potassium: 4.5 mmol/L (ref 3.5–5.1)
Sodium: 133 mmol/L — ABNORMAL LOW (ref 135–145)
Sodium: 137 mmol/L (ref 135–145)

## 2023-01-01 LAB — CBC
HCT: 31.5 % — ABNORMAL LOW (ref 36.0–46.0)
HCT: 31.2 % — ABNORMAL LOW (ref 36.0–46.0)
HCT: 34.2 % — ABNORMAL LOW (ref 36.0–46.0)
Hemoglobin: 10.8 g/dL — ABNORMAL LOW (ref 12.0–15.0)
Hemoglobin: 10.7 g/dL — ABNORMAL LOW (ref 12.0–15.0)
Hemoglobin: 11.7 g/dL — ABNORMAL LOW (ref 12.0–15.0)
MCH: 30.7 pg (ref 26.0–34.0)
MCH: 30.8 pg (ref 26.0–34.0)
MCH: 29.8 pg (ref 26.0–34.0)
MCHC: 34.3 g/dL (ref 30.0–36.0)
MCHC: 34.3 g/dL (ref 30.0–36.0)
MCHC: 34.2 g/dL (ref 30.0–36.0)
MCV: 89.5 fL (ref 80.0–100.0)
MCV: 89.9 fL (ref 80.0–100.0)
MCV: 87 fL (ref 80.0–100.0)
Platelets: 192 10*3/uL (ref 150–400)
Platelets: 109 10*3/uL — ABNORMAL LOW (ref 150–400)
Platelets: 156 10*3/uL (ref 150–400)
RBC: 3.52 MIL/uL — ABNORMAL LOW (ref 3.87–5.11)
RBC: 3.47 MIL/uL — ABNORMAL LOW (ref 3.87–5.11)
RBC: 3.93 MIL/uL (ref 3.87–5.11)
RDW: 13.1 % (ref 11.5–15.5)
RDW: 13 % (ref 11.5–15.5)
RDW: 13.2 % (ref 11.5–15.5)
WBC: 9.5 10*3/uL (ref 4.0–10.5)
WBC: 13 10*3/uL — ABNORMAL HIGH (ref 4.0–10.5)
WBC: 19.4 10*3/uL — ABNORMAL HIGH (ref 4.0–10.5)
nRBC: 0 % (ref 0.0–0.2)
nRBC: 0 % (ref 0.0–0.2)
nRBC: 0 % (ref 0.0–0.2)

## 2023-01-01 LAB — TYPE AND SCREEN
Unit division: 0
Unit division: 0

## 2023-01-01 LAB — BPAM RBC
ISSUE DATE / TIME: 202407230746
ISSUE DATE / TIME: 202407231529
Unit Type and Rh: 6200
Unit Type and Rh: 6200
Unit Type and Rh: 6200

## 2023-01-01 LAB — SURGICAL PCR SCREEN
MRSA, PCR: NEGATIVE
Staphylococcus aureus: NEGATIVE

## 2023-01-01 LAB — POCT I-STAT EG7
Acid-base deficit: 6 mmol/L — ABNORMAL HIGH (ref 0.0–2.0)
Bicarbonate: 20.4 mmol/L (ref 20.0–28.0)
Calcium, Ion: 1.1 mmol/L — ABNORMAL LOW (ref 1.15–1.40)
HCT: 20 % — ABNORMAL LOW (ref 36.0–46.0)
Hemoglobin: 6.8 g/dL — CL (ref 12.0–15.0)
O2 Saturation: 69 %
Potassium: 4.5 mmol/L (ref 3.5–5.1)
Sodium: 137 mmol/L (ref 135–145)
TCO2: 22 mmol/L (ref 22–32)
pCO2, Ven: 43 mmHg — ABNORMAL LOW (ref 44–60)
pH, Ven: 7.285 (ref 7.25–7.43)
pO2, Ven: 41 mmHg (ref 32–45)

## 2023-01-01 LAB — PROTIME-INR
INR: 1.3 — ABNORMAL HIGH (ref 0.8–1.2)
Prothrombin Time: 16.6 seconds — ABNORMAL HIGH (ref 11.4–15.2)

## 2023-01-01 LAB — MAGNESIUM: Magnesium: 2.9 mg/dL — ABNORMAL HIGH (ref 1.7–2.4)

## 2023-01-01 LAB — GLUCOSE, CAPILLARY
Glucose-Capillary: 289 mg/dL — ABNORMAL HIGH (ref 70–99)
Glucose-Capillary: 210 mg/dL — ABNORMAL HIGH (ref 70–99)
Glucose-Capillary: 131 mg/dL — ABNORMAL HIGH (ref 70–99)
Glucose-Capillary: 146 mg/dL — ABNORMAL HIGH (ref 70–99)
Glucose-Capillary: 144 mg/dL — ABNORMAL HIGH (ref 70–99)
Glucose-Capillary: 136 mg/dL — ABNORMAL HIGH (ref 70–99)
Glucose-Capillary: 169 mg/dL — ABNORMAL HIGH (ref 70–99)
Glucose-Capillary: 207 mg/dL — ABNORMAL HIGH (ref 70–99)

## 2023-01-01 LAB — ECHO INTRAOPERATIVE TEE

## 2023-01-01 LAB — PLATELET COUNT: Platelets: 119 10*3/uL — ABNORMAL LOW (ref 150–400)

## 2023-01-01 LAB — HEMOGLOBIN AND HEMATOCRIT, BLOOD
HCT: 19.9 % — ABNORMAL LOW (ref 36.0–46.0)
Hemoglobin: 6.8 g/dL — CL (ref 12.0–15.0)

## 2023-01-01 LAB — APTT
aPTT: 93 seconds — ABNORMAL HIGH (ref 24–36)
aPTT: 30 seconds (ref 24–36)

## 2023-01-01 LAB — PREPARE RBC (CROSSMATCH)

## 2023-01-01 LAB — LIPOPROTEIN A (LPA): Lipoprotein (a): 277.2 nmol/L — ABNORMAL HIGH (ref <75.0)

## 2023-01-01 SURGERY — CORONARY ARTERY BYPASS GRAFTING (CABG)
Anesthesia: General | Site: Chest

## 2023-01-01 MED ORDER — DEXTROSE 50 % IV SOLN: 0.0000 mL | INTRAVENOUS | Status: DC | PRN

## 2023-01-01 MED ORDER — BISACODYL 5 MG PO TBEC
5.0000 mg | DELAYED_RELEASE_TABLET | Freq: Once | ORAL | Status: DC
  Filled 2023-01-01: qty 1

## 2023-01-01 MED ORDER — PANTOPRAZOLE SODIUM 40 MG PO TBEC
40.0000 mg | DELAYED_RELEASE_TABLET | Freq: Every day | ORAL | Status: DC
  Administered 2023-01-03 – 2023-01-11 (×9): 40 mg via ORAL
  Filled 2023-01-01 (×9): qty 1

## 2023-01-01 MED ORDER — DEXMEDETOMIDINE HCL IN NACL 400 MCG/100ML IV SOLN
0.0000 ug/kg/h | INTRAVENOUS | Status: DC
  Administered 2023-01-01: 0.7 ug/kg/h via INTRAVENOUS
  Filled 2023-01-01: qty 100

## 2023-01-01 MED ORDER — LACTATED RINGERS IV SOLN: INTRAVENOUS | Status: DC

## 2023-01-01 MED ORDER — HEPARIN SODIUM (PORCINE) 1000 UNIT/ML IJ SOLN
INTRAMUSCULAR | Status: AC
  Filled 2023-01-01: qty 1

## 2023-01-01 MED ORDER — ORAL CARE MOUTH RINSE
15.0000 mL | OROMUCOSAL | Status: DC
  Administered 2023-01-01 – 2023-01-02 (×2): 15 mL via OROMUCOSAL

## 2023-01-01 MED ORDER — FENTANYL CITRATE (PF) 250 MCG/5ML IJ SOLN
INTRAMUSCULAR | Status: AC
  Filled 2023-01-01: qty 5

## 2023-01-01 MED ORDER — CHLORHEXIDINE GLUCONATE 0.12 % MT SOLN
15.0000 mL | OROMUCOSAL | Status: AC
  Filled 2023-01-01: qty 15

## 2023-01-01 MED ORDER — FENTANYL CITRATE (PF) 100 MCG/2ML IJ SOLN
INTRAMUSCULAR | Status: DC | PRN
  Administered 2023-01-01: 150 ug via INTRAVENOUS
  Administered 2023-01-01 (×3): 100 ug via INTRAVENOUS
  Administered 2023-01-01: 50 ug via INTRAVENOUS
  Administered 2023-01-01: 100 ug via INTRAVENOUS
  Administered 2023-01-01: 50 ug via INTRAVENOUS
  Administered 2023-01-01 (×2): 150 ug via INTRAVENOUS
  Administered 2023-01-01 (×3): 100 ug via INTRAVENOUS

## 2023-01-01 MED ORDER — DOBUTAMINE-DEXTROSE 4-5 MG/ML-% IV SOLN
INTRAVENOUS | Status: AC
  Administered 2023-01-01: 2.5 ug/kg/min via INTRAVENOUS
  Filled 2023-01-01: qty 250

## 2023-01-01 MED ORDER — PLASMA-LYTE A IV SOLN
INTRAVENOUS | Status: DC | PRN
  Administered 2023-01-01: 500 mL

## 2023-01-01 MED ORDER — SODIUM CHLORIDE 0.9 % IV SOLN: 250.0000 mL | INTRAVENOUS | Status: DC

## 2023-01-01 MED ORDER — INSULIN REGULAR(HUMAN) IN NACL 100-0.9 UT/100ML-% IV SOLN
INTRAVENOUS | Status: DC
  Administered 2023-01-02: 3.2 [IU]/h via INTRAVENOUS
  Filled 2023-01-01: qty 100

## 2023-01-01 MED ORDER — LACTATED RINGERS IV SOLN: INTRAVENOUS | Status: DC | PRN

## 2023-01-01 MED ORDER — ALBUMIN HUMAN 5 % IV SOLN
250.0000 mL | INTRAVENOUS | Status: DC | PRN
  Administered 2023-01-01 (×3): 12.5 g via INTRAVENOUS
  Filled 2023-01-01 (×2): qty 250

## 2023-01-01 MED ORDER — DOCUSATE SODIUM 50 MG/5ML PO LIQD: 200.0000 mg | Freq: Two times a day (BID) | ORAL | Status: DC

## 2023-01-01 MED ORDER — PROPOFOL 10 MG/ML IV BOLUS
INTRAVENOUS | Status: AC
  Filled 2023-01-01: qty 20

## 2023-01-01 MED ORDER — NOREPINEPHRINE 4 MG/250ML-% IV SOLN
0.0000 ug/min | INTRAVENOUS | Status: DC
  Administered 2023-01-01: 1 ug/min via INTRAVENOUS
  Filled 2023-01-01: qty 250

## 2023-01-01 MED ORDER — ACETAMINOPHEN 500 MG PO TABS
1000.0000 mg | ORAL_TABLET | Freq: Four times a day (QID) | ORAL | Status: AC
  Administered 2023-01-02 – 2023-01-06 (×18): 1000 mg via ORAL
  Filled 2023-01-01 (×18): qty 2

## 2023-01-01 MED ORDER — TRAMADOL HCL 50 MG PO TABS
50.0000 mg | ORAL_TABLET | ORAL | Status: DC | PRN
  Administered 2023-01-02: 100 mg
  Filled 2023-01-01: qty 2

## 2023-01-01 MED ORDER — SODIUM CHLORIDE 0.9% FLUSH
3.0000 mL | Freq: Two times a day (BID) | INTRAVENOUS | Status: DC
  Administered 2023-01-02 – 2023-01-04 (×4): 3 mL via INTRAVENOUS

## 2023-01-01 MED ORDER — SODIUM CHLORIDE 0.9% FLUSH: 10.0000 mL | INTRAVENOUS | Status: DC | PRN

## 2023-01-01 MED ORDER — PROTAMINE SULFATE 10 MG/ML IV SOLN
INTRAVENOUS | Status: DC | PRN
  Administered 2023-01-01: 270 mg via INTRAVENOUS

## 2023-01-01 MED ORDER — SODIUM CHLORIDE 0.9 % IV SOLN: INTRAVENOUS | Status: DC

## 2023-01-01 MED ORDER — PROTAMINE SULFATE 10 MG/ML IV SOLN
INTRAVENOUS | Status: AC
  Filled 2023-01-01: qty 5

## 2023-01-01 MED ORDER — FENTANYL CITRATE PF 50 MCG/ML IJ SOSY
50.0000 ug | PREFILLED_SYRINGE | INTRAMUSCULAR | Status: DC | PRN
  Administered 2023-01-01 – 2023-01-02 (×4): 50 ug via INTRAVENOUS
  Filled 2023-01-01 (×2): qty 2

## 2023-01-01 MED ORDER — SODIUM BICARBONATE 8.4 % IV SOLN
100.0000 meq | Freq: Once | INTRAVENOUS | Status: AC
  Administered 2023-01-01: 100 meq via INTRAVENOUS

## 2023-01-01 MED ORDER — CEFAZOLIN SODIUM-DEXTROSE 2-4 GM/100ML-% IV SOLN
2.0000 g | Freq: Three times a day (TID) | INTRAVENOUS | Status: AC
  Administered 2023-01-01 – 2023-01-03 (×6): 2 g via INTRAVENOUS
  Filled 2023-01-01 (×6): qty 100

## 2023-01-01 MED ORDER — OXYCODONE HCL 5 MG PO TABS
5.0000 mg | ORAL_TABLET | ORAL | Status: DC | PRN
  Administered 2023-01-02: 10 mg
  Filled 2023-01-01: qty 1
  Filled 2023-01-01: qty 2

## 2023-01-01 MED ORDER — MAGNESIUM SULFATE 4 GM/100ML IV SOLN
4.0000 g | Freq: Once | INTRAVENOUS | Status: AC
  Administered 2023-01-01: 4 g via INTRAVENOUS
  Filled 2023-01-01: qty 100

## 2023-01-01 MED ORDER — VANCOMYCIN HCL IN DEXTROSE 1-5 GM/200ML-% IV SOLN
1000.0000 mg | Freq: Once | INTRAVENOUS | Status: AC
  Administered 2023-01-01: 1000 mg via INTRAVENOUS
  Filled 2023-01-01: qty 200

## 2023-01-01 MED ORDER — HEMOSTATIC AGENTS (NO CHARGE) OPTIME
TOPICAL | Status: DC | PRN
  Administered 2023-01-01 (×2): 1 via TOPICAL

## 2023-01-01 MED ORDER — PANTOPRAZOLE SODIUM 40 MG IV SOLR
40.0000 mg | Freq: Every day | INTRAVENOUS | Status: AC
  Administered 2023-01-01 – 2023-01-02 (×2): 40 mg via INTRAVENOUS
  Filled 2023-01-01 (×2): qty 10

## 2023-01-01 MED ORDER — DOBUTAMINE-DEXTROSE 4-5 MG/ML-% IV SOLN: 2.5000 ug/kg/min | INTRAVENOUS | Status: DC

## 2023-01-01 MED ORDER — DOCUSATE SODIUM 100 MG PO CAPS: 200.0000 mg | ORAL_CAPSULE | Freq: Every day | ORAL | Status: DC

## 2023-01-01 MED ORDER — PROPOFOL 10 MG/ML IV BOLUS
INTRAVENOUS | Status: DC | PRN
  Administered 2023-01-01: 120 mg via INTRAVENOUS

## 2023-01-01 MED ORDER — ACETAMINOPHEN 160 MG/5ML PO SOLN
650.0000 mg | Freq: Once | ORAL | Status: AC
  Administered 2023-01-01: 650 mg
  Filled 2023-01-01: qty 20.3

## 2023-01-01 MED ORDER — NICARDIPINE HCL IN NACL 20-0.86 MG/200ML-% IV SOLN
0.0000 mg/h | INTRAVENOUS | Status: DC
  Administered 2023-01-02: 2.5 mg/h via INTRAVENOUS
  Filled 2023-01-01: qty 200

## 2023-01-01 MED ORDER — CHLORHEXIDINE GLUCONATE 0.12 % MT SOLN
15.0000 mL | Freq: Once | OROMUCOSAL | Status: AC
  Administered 2023-01-01: 15 mL via OROMUCOSAL

## 2023-01-01 MED ORDER — POTASSIUM CHLORIDE 10 MEQ/50ML IV SOLN
10.0000 meq | INTRAVENOUS | Status: AC
  Administered 2023-01-01 (×3): 10 meq via INTRAVENOUS

## 2023-01-01 MED ORDER — METOPROLOL TARTRATE 25 MG/10 ML ORAL SUSPENSION
12.5000 mg | Freq: Two times a day (BID) | ORAL | Status: DC
  Administered 2023-01-01: 12.5 mg
  Filled 2023-01-01: qty 10

## 2023-01-01 MED ORDER — METOPROLOL TARTRATE 12.5 MG HALF TABLET: 12.5000 mg | ORAL_TABLET | Freq: Once | ORAL | Status: DC

## 2023-01-01 MED ORDER — METOPROLOL TARTRATE 12.5 MG HALF TABLET: 12.5000 mg | ORAL_TABLET | Freq: Two times a day (BID) | ORAL | Status: DC

## 2023-01-01 MED ORDER — SODIUM CHLORIDE 0.9% FLUSH
10.0000 mL | Freq: Two times a day (BID) | INTRAVENOUS | Status: DC
  Administered 2023-01-01 – 2023-01-02 (×2): 20 mL
  Administered 2023-01-03 (×2): 10 mL

## 2023-01-01 MED ORDER — MIDAZOLAM HCL (PF) 10 MG/2ML IJ SOLN
INTRAMUSCULAR | Status: AC
  Filled 2023-01-01: qty 2

## 2023-01-01 MED ORDER — SODIUM CHLORIDE 0.45 % IV SOLN: INTRAVENOUS | Status: DC | PRN

## 2023-01-01 MED ORDER — ROCURONIUM BROMIDE 100 MG/10ML IV SOLN
INTRAVENOUS | Status: DC | PRN
  Administered 2023-01-01: 60 mg via INTRAVENOUS
  Administered 2023-01-01 (×2): 40 mg via INTRAVENOUS
  Administered 2023-01-01: 10 mg via INTRAVENOUS
  Administered 2023-01-01: 50 mg via INTRAVENOUS

## 2023-01-01 MED ORDER — ONDANSETRON HCL 4 MG/2ML IJ SOLN
4.0000 mg | Freq: Four times a day (QID) | INTRAMUSCULAR | Status: DC | PRN
  Administered 2023-01-04 – 2023-01-09 (×5): 4 mg via INTRAVENOUS
  Filled 2023-01-01 (×6): qty 2

## 2023-01-01 MED ORDER — SODIUM CHLORIDE 0.9% FLUSH: 3.0000 mL | INTRAVENOUS | Status: DC | PRN

## 2023-01-01 MED ORDER — TRAMADOL HCL 50 MG PO TABS: 50.0000 mg | ORAL_TABLET | ORAL | Status: DC | PRN

## 2023-01-01 MED ORDER — ORAL CARE MOUTH RINSE: 15.0000 mL | Freq: Once | OROMUCOSAL | Status: AC

## 2023-01-01 MED ORDER — BISACODYL 10 MG RE SUPP
10.0000 mg | Freq: Every day | RECTAL | Status: DC
  Filled 2023-01-01: qty 1

## 2023-01-01 MED ORDER — MIDAZOLAM HCL 2 MG/2ML IJ SOLN
2.0000 mg | INTRAMUSCULAR | Status: DC | PRN
  Filled 2023-01-01: qty 2

## 2023-01-01 MED ORDER — FLEET ENEMA 7-19 GM/118ML RE ENEM
1.0000 | ENEMA | Freq: Once | RECTAL | Status: DC
  Filled 2023-01-01: qty 1

## 2023-01-01 MED ORDER — METOPROLOL TARTRATE 5 MG/5ML IV SOLN: 2.5000 mg | INTRAVENOUS | Status: DC | PRN

## 2023-01-01 MED ORDER — PHENYLEPHRINE HCL (PRESSORS) 10 MG/ML IV SOLN
INTRAVENOUS | Status: DC | PRN
  Administered 2023-01-01 (×3): 80 ug via INTRAVENOUS

## 2023-01-01 MED ORDER — ORAL CARE MOUTH RINSE: 15.0000 mL | OROMUCOSAL | Status: DC | PRN

## 2023-01-01 MED ORDER — CHLORHEXIDINE GLUCONATE CLOTH 2 % EX PADS: 6.0000 | MEDICATED_PAD | Freq: Once | CUTANEOUS | Status: DC

## 2023-01-01 MED ORDER — CHLORHEXIDINE GLUCONATE 0.12 % MT SOLN
15.0000 mL | Freq: Once | OROMUCOSAL | Status: DC
  Filled 2023-01-01: qty 15

## 2023-01-01 MED ORDER — CHLORHEXIDINE GLUCONATE CLOTH 2 % EX PADS
6.0000 | MEDICATED_PAD | Freq: Once | CUTANEOUS | Status: AC
  Administered 2023-01-01: 6 via TOPICAL

## 2023-01-01 MED ORDER — PROTAMINE SULFATE 10 MG/ML IV SOLN
INTRAVENOUS | Status: AC
  Filled 2023-01-01: qty 25

## 2023-01-01 MED ORDER — CHLORHEXIDINE GLUCONATE CLOTH 2 % EX PADS
6.0000 | MEDICATED_PAD | Freq: Every day | CUTANEOUS | Status: DC
  Administered 2023-01-01 – 2023-01-06 (×7): 6 via TOPICAL

## 2023-01-01 MED ORDER — METOCLOPRAMIDE HCL 5 MG/ML IJ SOLN
10.0000 mg | Freq: Four times a day (QID) | INTRAMUSCULAR | Status: AC
  Administered 2023-01-01 – 2023-01-02 (×6): 10 mg via INTRAVENOUS
  Filled 2023-01-01 (×6): qty 2

## 2023-01-01 MED ORDER — BISACODYL 5 MG PO TBEC
10.0000 mg | DELAYED_RELEASE_TABLET | Freq: Every day | ORAL | Status: DC
  Administered 2023-01-02 – 2023-01-09 (×4): 10 mg via ORAL
  Filled 2023-01-01 (×5): qty 2

## 2023-01-01 MED ORDER — TOPIRAMATE 25 MG PO TABS
50.0000 mg | ORAL_TABLET | Freq: Two times a day (BID) | ORAL | Status: DC
  Administered 2023-01-01 – 2023-01-11 (×20): 50 mg via ORAL
  Filled 2023-01-01 (×20): qty 2

## 2023-01-01 MED ORDER — 0.9 % SODIUM CHLORIDE (POUR BTL) OPTIME
TOPICAL | Status: DC | PRN
  Administered 2023-01-01: 5000 mL

## 2023-01-01 MED ORDER — ACETAMINOPHEN 160 MG/5ML PO SOLN
1000.0000 mg | Freq: Four times a day (QID) | ORAL | Status: AC
  Administered 2023-01-01: 1000 mg
  Filled 2023-01-01: qty 40.6

## 2023-01-01 MED ORDER — TEMAZEPAM 7.5 MG PO CAPS: 15.0000 mg | ORAL_CAPSULE | Freq: Once | ORAL | Status: DC | PRN

## 2023-01-01 MED ORDER — MIDAZOLAM HCL (PF) 5 MG/ML IJ SOLN
INTRAMUSCULAR | Status: DC | PRN
  Administered 2023-01-01 (×5): 2 mg via INTRAVENOUS

## 2023-01-01 SURGICAL SUPPLY — 97 items
APL SKNCLS STERI-STRIP NONHPOA (GAUZE/BANDAGES/DRESSINGS) ×2
BAG DECANTER FOR FLEXI CONT (MISCELLANEOUS) ×2 IMPLANT
BENZOIN TINCTURE PRP APPL 2/3 (GAUZE/BANDAGES/DRESSINGS) IMPLANT
BLADE CLIPPER SURG (BLADE) ×2 IMPLANT
BLADE STERNUM SYSTEM 6 (BLADE) ×2 IMPLANT
BNDG CMPR 5X4 KNIT ELC UNQ LF (GAUZE/BANDAGES/DRESSINGS) ×2
BNDG CMPR 6 X 5 YARDS HK CLSR (GAUZE/BANDAGES/DRESSINGS) ×2
BNDG ELASTIC 4INX 5YD STR LF (GAUZE/BANDAGES/DRESSINGS) IMPLANT
BNDG ELASTIC 4X5.8 VLCR STR LF (GAUZE/BANDAGES/DRESSINGS) ×2 IMPLANT
BNDG ELASTIC 6INX 5YD STR LF (GAUZE/BANDAGES/DRESSINGS) IMPLANT
BNDG ELASTIC 6X5.8 VLCR STR LF (GAUZE/BANDAGES/DRESSINGS) ×2 IMPLANT
BNDG GAUZE DERMACEA FLUFF 4 (GAUZE/BANDAGES/DRESSINGS) ×2 IMPLANT
BNDG GZE DERMACEA 4 6PLY (GAUZE/BANDAGES/DRESSINGS) ×2
CABLE SURGICAL S-101-97-12 (CABLE) ×2 IMPLANT
CANISTER SUCT 3000ML PPV (MISCELLANEOUS) ×2 IMPLANT
CANISTER WOUNDNEG PRESSURE 500 (CANNISTER) IMPLANT
CANNULA AORTIC ROOT 9FR (CANNULA) IMPLANT
CANNULA MC2 2 STG 29/37 NON-V (CANNULA) ×2 IMPLANT
CANNULA NON VENT 20FR 12 (CANNULA) ×2 IMPLANT
CATH ROBINSON RED A/P 18FR (CATHETERS) ×4 IMPLANT
CLIP TI MEDIUM 24 (CLIP) IMPLANT
CLIP TI WIDE RED SMALL 24 (CLIP) IMPLANT
CONN ST 1/2X1/2 BEN (MISCELLANEOUS) ×2 IMPLANT
CONNECTOR BLAKE 2:1 CARIO BLK (MISCELLANEOUS) ×2 IMPLANT
CONTAINER PROTECT SURGISLUSH (MISCELLANEOUS) ×4 IMPLANT
DRAIN CHANNEL 19F RND (DRAIN) ×6 IMPLANT
DRAIN CONNECTOR BLAKE 1:1 (MISCELLANEOUS) ×2 IMPLANT
DRAPE CARDIOVASCULAR INCISE (DRAPES) ×2
DRAPE INCISE IOBAN 66X45 STRL (DRAPES) IMPLANT
DRAPE SRG 135X102X78XABS (DRAPES) ×2 IMPLANT
DRAPE WARM FLUID 44X44 (DRAPES) ×2 IMPLANT
DRESSING PEEL AND PLAC PRVNA20 (GAUZE/BANDAGES/DRESSINGS) IMPLANT
DRSG AQUACEL AG ADV 3.5X10 (GAUZE/BANDAGES/DRESSINGS) ×2 IMPLANT
DRSG PEEL AND PLACE PREVENA 20 (GAUZE/BANDAGES/DRESSINGS) ×2
ELECT BLADE 4.0 EZ CLEAN MEGAD (MISCELLANEOUS) ×2
ELECT REM PT RETURN 9FT ADLT (ELECTROSURGICAL) ×4
ELECTRODE BLDE 4.0 EZ CLN MEGD (MISCELLANEOUS) ×2 IMPLANT
ELECTRODE REM PT RTRN 9FT ADLT (ELECTROSURGICAL) ×4 IMPLANT
FELT TEFLON 1X6 (MISCELLANEOUS) ×4 IMPLANT
GAUZE 4X4 16PLY ~~LOC~~+RFID DBL (SPONGE) ×2 IMPLANT
GAUZE SPONGE 4X4 12PLY STRL (GAUZE/BANDAGES/DRESSINGS) ×4 IMPLANT
GLOVE BIO SURGEON STRL SZ 6.5 (GLOVE) IMPLANT
GLOVE BIO SURGEON STRL SZ7 (GLOVE) ×4 IMPLANT
GLOVE BIOGEL M STRL SZ7.5 (GLOVE) ×4 IMPLANT
GLOVE BIOGEL PI IND STRL 6.5 (GLOVE) IMPLANT
GLOVE BIOGEL PI IND STRL 7.5 (GLOVE) IMPLANT
GLOVE BIOGEL PI IND STRL 9 (GLOVE) IMPLANT
GLOVE SURG SS PI 7.5 STRL IVOR (GLOVE) IMPLANT
GOWN STRL REUS W/ TWL LRG LVL3 (GOWN DISPOSABLE) ×8 IMPLANT
GOWN STRL REUS W/ TWL XL LVL3 (GOWN DISPOSABLE) ×4 IMPLANT
GOWN STRL REUS W/TWL LRG LVL3 (GOWN DISPOSABLE) ×10
GOWN STRL REUS W/TWL XL LVL3 (GOWN DISPOSABLE) ×6
HEMOSTAT POWDER SURGIFOAM 1G (HEMOSTASIS) ×4 IMPLANT
INSERT FOGARTY XLG (MISCELLANEOUS) IMPLANT
INSERT SUTURE HOLDER (MISCELLANEOUS) ×2 IMPLANT
KIT BASIN OR (CUSTOM PROCEDURE TRAY) ×2 IMPLANT
KIT SUCTION CATH 14FR (SUCTIONS) ×2 IMPLANT
KIT TURNOVER KIT B (KITS) ×2 IMPLANT
KIT VASOVIEW HEMOPRO 2 VH 4000 (KITS) ×2 IMPLANT
LEAD PACING MYOCARDI (MISCELLANEOUS) ×2 IMPLANT
LINE EXTENSION DELIVERY (MISCELLANEOUS) IMPLANT
MARKER GRAFT CORONARY BYPASS (MISCELLANEOUS) ×6 IMPLANT
NS IRRIG 1000ML POUR BTL (IV SOLUTION) ×10 IMPLANT
PACK E OPEN HEART (SUTURE) ×2 IMPLANT
PACK OPEN HEART (CUSTOM PROCEDURE TRAY) ×2 IMPLANT
PAD ARMBOARD 7.5X6 YLW CONV (MISCELLANEOUS) ×4 IMPLANT
PAD ELECT DEFIB RADIOL ZOLL (MISCELLANEOUS) ×2 IMPLANT
PENCIL BUTTON HOLSTER BLD 10FT (ELECTRODE) ×2 IMPLANT
POSITIONER HEAD DONUT 9IN (MISCELLANEOUS) ×2 IMPLANT
PUNCH AORTIC ROTATE 4.0MM (MISCELLANEOUS) ×2 IMPLANT
SET MPS 3-ND DEL (MISCELLANEOUS) IMPLANT
SOL PREP POV-IOD 4OZ 10% (MISCELLANEOUS) IMPLANT
SOL SCRUB PVP POV-IOD 4OZ 7.5% (MISCELLANEOUS) ×4
SOLUTION SCRB POV-IOD 4OZ 7.5% (MISCELLANEOUS) IMPLANT
SPONGE T-LAP 18X18 ~~LOC~~+RFID (SPONGE) ×8 IMPLANT
SUPPORT HEART JANKE-BARRON (MISCELLANEOUS) ×2 IMPLANT
SUT BONE WAX W31G (SUTURE) ×2 IMPLANT
SUT ETHIBOND X763 2 0 SH 1 (SUTURE) ×4 IMPLANT
SUT MNCRL AB 3-0 PS2 18 (SUTURE) ×4 IMPLANT
SUT PDS AB 1 CTX 36 (SUTURE) ×4 IMPLANT
SUT PROLENE 4 0 SH DA (SUTURE) ×2 IMPLANT
SUT PROLENE 5 0 C 1 36 (SUTURE) ×6 IMPLANT
SUT PROLENE 7 0 BV 1 (SUTURE) IMPLANT
SUT PROLENE 7 0 BV1 MDA (SUTURE) ×2 IMPLANT
SUT STEEL 6MS V (SUTURE) ×4 IMPLANT
SUT VIC AB 2-0 CT1 27 (SUTURE) ×2
SUT VIC AB 2-0 CT1 TAPERPNT 27 (SUTURE) IMPLANT
SUT VICRYL 3-0 27 CRC X-1 (SUTURE) IMPLANT
SYSTEM SAHARA CHEST DRAIN ATS (WOUND CARE) ×2 IMPLANT
TAPE CLOTH SURG 4X10 WHT LF (GAUZE/BANDAGES/DRESSINGS) IMPLANT
TOWEL GREEN STERILE (TOWEL DISPOSABLE) ×2 IMPLANT
TOWEL GREEN STERILE FF (TOWEL DISPOSABLE) ×2 IMPLANT
TRAY FOLEY SLVR 16FR TEMP STAT (SET/KITS/TRAYS/PACK) ×2 IMPLANT
TUBE SUCTION CARDIAC 10FR (CANNULA) IMPLANT
TUBING LAP HI FLOW INSUFFLATIO (TUBING) ×2 IMPLANT
UNDERPAD 30X36 HEAVY ABSORB (UNDERPADS AND DIAPERS) ×2 IMPLANT
WATER STERILE IRR 1000ML POUR (IV SOLUTION) ×4 IMPLANT

## 2023-01-01 NOTE — Anesthesia Procedure Notes (Signed)
Central Venous Catheter Insertion Performed by: Atilano Median, DO, anesthesiologist Start/End7/23/2024 11:35 AM, 01/01/2023 11:50 AM Patient location: Pre-op. Preanesthetic checklist: patient identified, IV checked, site marked, risks and benefits discussed, surgical consent, monitors and equipment checked, pre-op evaluation, timeout performed and anesthesia consent Position: Trendelenburg Lidocaine 1% used for infiltration and patient sedated Hand hygiene performed  and maximum sterile barriers used  Catheter size: 8.5 Fr Central line was placed.Sheath introducer Procedure performed using ultrasound guided technique. Ultrasound Notes:anatomy identified, needle tip was noted to be adjacent to the nerve/plexus identified, no ultrasound evidence of intravascular and/or intraneural injection and image(s) printed for medical record Attempts: 1 Following insertion, line sutured, dressing applied and Biopatch. Post procedure assessment: blood return through all ports, free fluid flow and no air  Patient tolerated the procedure well with no immediate complications. Additional procedure comments: Triple lumen placed through introducer port and flushed appropriately. Marland Kitchen

## 2023-01-01 NOTE — Transfer of Care (Signed)
Immediate Anesthesia Transfer of Care Note  Patient: [name]  Procedure(s) Performed: CORONARY ARTERY BYPASS GRAFTING (CABG) TIMES THREE USING THE LEFT INTERNAL MAMMARY ARTERY (LIMA) AND ENDOSCOPICALLY HARVESTED RIGHT GREATER SAPHENOUS VEIN (Chest) TRANSESOPHAGEAL ECHOCARDIOGRAM  Patient Location: ICU  Anesthesia Type:General  Level of Consciousness: Patient remains intubated per anesthesia plan  Airway & Oxygen Therapy: Patient remains intubated per anesthesia plan and Patient placed on Ventilator (see vital sign flow sheet for setting)  Post-op Assessment: Report given to RN and Post -op Vital signs reviewed and stable  Post vital signs: Reviewed and stable  Last Vitals:  Vitals Value Taken Time  BP 121/74 01/01/23 1718  Temp    Pulse 67 01/01/23 1718  Resp 18 01/01/23 1718  SpO2 100 % 01/01/23 1718  Vitals shown include unfiled device data.  Last Pain:  Vitals:   01/01/23 1102  TempSrc: Oral  PainSc: 2       Patients Stated Pain Goal: 0 (12/31/22 1514)  Complications: No notable events documented.

## 2023-01-01 NOTE — Brief Op Note (Signed)
12/27/2022 - 01/01/2023  2:33 PM  PATIENT:  [name]  62 y.o. female  PRE-OPERATIVE DIAGNOSIS:  CORONARY ARTERY DISEASE  POST-OPERATIVE DIAGNOSIS:  CORONARY ARTERY DISEASE  PROCEDURE:   CORONARY ARTERY BYPASS GRAFTING x 3 LIMA-LAD SVG-OM SVG-PDA   Vein harvest time: Vein prep time:   TRANSESOPHAGEAL ECHOCARDIOGRAM  SURGEON:  Corliss Skains, MD   PHYSICIAN ASSISTANT: Cavion Faiola  ASSISTANTS: Velvet Bathe, RN, RN First Assistant         Beck, Aldine Contes, RN, Circulator   ANESTHESIA:   general  EBL:   BLOOD ADMINISTERED: 1 unit PRBCs  DRAINS:  Left pleural and mediastinal drains    LOCAL MEDICATIONS USED:  NONE  COUNTS:  Correct  DICTATION: .Dragon Dictation  PLAN OF CARE: Admit to inpatient   PATIENT DISPOSITION:  ICU - intubated and hemodynamically stable.   Delay start of Pharmacological VTE agent (>24hrs) due to surgical blood loss or risk of bleeding: yes

## 2023-01-01 NOTE — Progress Notes (Signed)
Rounding Note    Patient Name: [name] Date of Encounter: 01/01/2023  [facility] Cardiologist: None   Subjective   Reports hematoma at rt radial site this morning, mild tingling in right fingers. No other complaints.   Inpatient Medications    Scheduled Meds:  atorvastatin  80 mg Oral Daily   buPROPion  450 mg Oral Daily   epinephrine  0-10 mcg/min Intravenous To OR   heparin sodium (porcine) 2,500 Units, papaverine 30 mg in electrolyte-A (PLASMALYTE-A PH 7.4) 500 mL irrigation   Irrigation To OR   icosapent Ethyl  2 g Oral BID   insulin aspart  0-5 Units Subcutaneous QHS   insulin aspart  0-9 Units Subcutaneous TID WC   insulin   Intravenous To OR   [place] Blood Cardioplegia vial (lidocaine/magnesium/mannitol 0.26g-4g-6.4g)   Intracoronary To OR   metoprolol tartrate  12.5 mg Oral BID   phenylephrine  30-200 mcg/min Intravenous To OR   potassium chloride  80 mEq Other To OR   sodium chloride flush  3 mL Intravenous Q12H   sodium chloride flush  3 mL Intravenous Q12H   topiramate  50 mg Oral BID   tranexamic acid  15 mg/kg Intravenous To OR   tranexamic acid  2 mg/kg Intracatheter To OR   Continuous Infusions:  sodium chloride      ceFAZolin (ANCEF) IV      ceFAZolin (ANCEF) IV     dexmedetomidine     heparin 30,000 units/NS 1000 mL solution for CELLSAVER     heparin 1,650 Units/hr (12/31/22 2137)   milrinone     nitroGLYCERIN 45 mcg/min (12/31/22 2348)   nitroGLYCERIN     norepinephrine     tranexamic acid (CYKLOKAPRON) 2,500 mg in sodium chloride 0.9 % 250 mL (10 mg/mL) infusion     vancomycin     PRN Meds: sodium chloride, acetaminophen **OR** acetaminophen, albuterol, ondansetron **OR** ondansetron (ZOFRAN) IV, ondansetron (ZOFRAN) IV, oxyCODONE, senna-docusate, sodium chloride flush   Vital Signs    Vitals:   12/31/22 2016 12/31/22 2223 01/01/23 0033 01/01/23 0411  BP: 124/88 117/60 117/70 128/80  Pulse: 76 71 60 66  Resp:  18  18 18   Temp: 98.1 F (36.7 C)  97.8 F (36.6 C) 97.8 F (36.6 C)  TempSrc: Oral  Oral Oral  SpO2: 99%  100% 100%  Weight:    90.8 kg  Height:        Intake/Output Summary (Last 24 hours) at 01/01/2023 0732 Last data filed at 01/01/2023 0417 Gross per 24 hour  Intake 3 ml  Output 750 ml  Net -747 ml      01/01/2023    4:11 AM 12/31/2022    4:20 AM 12/30/2022    5:04 AM  Last 3 Weights  Weight (lbs) 200 lb 1.6 oz 200 lb 4.8 oz 200 lb 4.8 oz  Weight (kg) 90.765 kg 90.855 kg 90.855 kg      Telemetry    NSR HR 60-70s - Personally Reviewed  ECG    NSR HR 69 - Personally Reviewed  Physical Exam   GEN: No acute distress.   Neck: No JVD Cardiac: RRR, no murmurs, rubs, or gallops.  Respiratory: Clear to auscultation bilaterally. Vascular: Rt radial pulse +2, bruising, site soft.   MS: No edema; No deformity. Neuro:  Nonfocal  Psych: Normal affect   Labs    High Sensitivity Troponin:   Recent Labs  Lab 12/27/22 1618 12/27/22 1810  TROPONINIHS 38* 34*  Chemistry Recent Labs  Lab 12/29/22 0516 12/30/22 0407 12/31/22 0403 01/01/23 0005  NA 137 135 135 133*  K 4.1 3.7 3.8 3.7  CL 107 105 107 106  CO2 22 18* 19* 19*  GLUCOSE 213* 210* 291* 261*  BUN 12 15 20  26*  CREATININE 0.88 0.95 1.14* 1.22*  CALCIUM 9.0 9.0 9.0 9.3  MG 1.3* 1.9  --   --   PROT 5.8*  --   --   --   ALBUMIN 3.3*  --   --   --   AST 19  --   --   --   ALT 23  --   --   --   ALKPHOS 41  --   --   --   BILITOT 0.4  --   --   --   GFRNONAA >60 >60 54* 50*  ANIONGAP 8 12 9 8     Lipids  Recent Labs  Lab 12/28/22 0453  CHOL 261*  TRIG 594*  HDL 38*  LDLCALC UNABLE TO CALCULATE IF TRIGLYCERIDE OVER 400 mg/dL  CHOLHDL 6.9    Hematology Recent Labs  Lab 12/30/22 0407 12/31/22 0403 01/01/23 0005  WBC 7.9 8.1 9.5  RBC 3.76* 3.62* 3.52*  HGB 11.1* 10.7* 10.8*  HCT 33.6* 32.8* 31.5*  MCV 89.4 90.6 89.5  MCH 29.5 29.6 30.7  MCHC 33.0 32.6 34.3  RDW 13.4 13.2 13.1  PLT 189  189 192     BNP Recent Labs  Lab 12/27/22 1618  BNP 46.8    DDimer  Recent Labs  Lab 12/27/22 1618  DDIMER <0.27     Radiology    DG Chest 2 View  Result Date: 12/31/2022 CLINICAL DATA:  Preop testing EXAM: CHEST - 2 VIEW COMPARISON:  Chest x-ray 12/27/2022 FINDINGS: The heart size and mediastinal contours are within normal limits. Both lungs are clear. The visualized skeletal structures are unremarkable. IMPRESSION: No active cardiopulmonary disease. Electronically Signed   By: Darliss Cheney M.D.   On: 12/31/2022 23:39   MR BRAIN WO CONTRAST  Result Date: 12/31/2022 CLINICAL DATA:  Initial evaluation for TIA. EXAM: MRI HEAD WITHOUT CONTRAST TECHNIQUE: Multiplanar, multiecho pulse sequences of the brain and surrounding structures were obtained without intravenous contrast. COMPARISON:  Prior CT from 04/25/2019. FINDINGS: Brain: Cerebral volume within normal limits. No significant cerebral white matter disease for age. Punctate acute ischemic infarcts seen involving the right cerebellum (series 2, image 11). Additional punctate cortical infarct present at the left occipital lobe (series 2, image 20). Probable subtle punctate subacute infarct at the left frontal centrum semi ovale (series 2, image 36). No associated hemorrhage or mass effect. No other evidence for acute or subacute ischemia. Gray-white matter differentiation otherwise maintained. No other areas of chronic cortical infarction. No acute intracranial hemorrhage. Single punctate chronic microhemorrhage noted within the right cerebral hemisphere, of doubtful significance in isolation. Dilated perivascular space noted at the inferior left basal ganglia. No mass lesion, midline shift or mass effect. No hydrocephalus or extra-axial fluid collection. Pituitary gland suprasellar region within normal limits. Vascular: Major intracranial vascular flow voids are maintained. Skull and upper cervical spine: Craniocervical junction within  normal limits. Bone marrow signal intensity within normal limits. No scalp soft tissue abnormality. Sinuses/Orbits: Globes and orbital soft tissues within normal limits. Mild mucosal thickening noted about the ethmoidal air cells and maxillary sinuses. No significant mastoid effusion. Other: None. IMPRESSION: 1. Punctate acute ischemic nonhemorrhagic infarcts involving the left occipital lobe and right cerebellum as above.  2. Additional probable punctate subacute ischemic nonhemorrhagic infarct involving the left frontal centrum semi ovale. 3. Otherwise normal brain MRI for age. Electronically Signed   By: Rise Mu M.D.   On: 12/31/2022 23:30   VAS US DOPPLER PRE CABG  Result Date: 12/31/2022 PREOPERATIVE VASCULAR EVALUATION Patient Name:  [name]  Date of Exam:   12/31/2022 Medical Rec #: [id number]             Accession #:    [id number] Date of Birth: October 08, 1960             Patient Gender: F Patient Age:   84 years Exam Location:  [facility] Procedure:      VAS US DOPPLER PRE CABG Referring Phys: MYRON RODDENBERRY --------------------------------------------------------------------------------  Indications:      Pre-CABG. Risk Factors:     Hypertension, hyperlipidemia, Diabetes, coronary artery                   disease. Comparison Study: No prior study Performing Technologist: Sherren Kerns RVS Supporting Technologist: Shona Simpson  Examination Guidelines: A complete evaluation includes B-mode imaging, spectral Doppler, color Doppler, and power Doppler as needed of all accessible portions of each vessel. Bilateral testing is considered an integral part of a complete examination. Limited examinations for reoccurring indications may be performed as noted.  Right Carotid Findings: +----------+--------+--------+--------+--------+------------------+           PSV cm/sEDV cm/sStenosisDescribeComments            +----------+--------+--------+--------+--------+------------------+ CCA Prox  76      15                      intimal thickening +----------+--------+--------+--------+--------+------------------+ CCA Distal79      23                      intimal thickening +----------+--------+--------+--------+--------+------------------+ ICA Prox  211     57      40-59%  calcific                   +----------+--------+--------+--------+--------+------------------+ ICA Mid   121     51                                         +----------+--------+--------+--------+--------+------------------+ ICA Distal70      22                      tortuous           +----------+--------+--------+--------+--------+------------------+ ECA       352     12      >50%    calcific                   +----------+--------+--------+--------+--------+------------------+ +----------+--------+-------+--------+------------+           PSV cm/sEDV cmsDescribeArm Pressure +----------+--------+-------+--------+------------+ Subclavian129                                 +----------+--------+-------+--------+------------+ +---------+--------+--+--------+--+ VertebralPSV cm/s75EDV cm/s19 +---------+--------+--+--------+--+ Left Carotid Findings: +----------+--------+--------+--------+------------+------------------+           PSV cm/sEDV cm/sStenosisDescribe    Comments           +----------+--------+--------+--------+------------+------------------+ CCA Prox  113     21  intimal thickening +----------+--------+--------+--------+------------+------------------+ CCA Distal116     22              heterogenous                   +----------+--------+--------+--------+------------+------------------+ ICA Prox  150     41      40-59%  calcific                       +----------+--------+--------+--------+------------+------------------+ ICA Mid   164     33                                              +----------+--------+--------+--------+------------+------------------+ ICA Distal99      34                          tortuous           +----------+--------+--------+--------+------------+------------------+ ECA       298     12              calcific                       +----------+--------+--------+--------+------------+------------------+ +----------+--------+--------+--------+------------+ SubclavianPSV cm/sEDV cm/sDescribeArm Pressure +----------+--------+--------+--------+------------+           131                                  +----------+--------+--------+--------+------------+ +---------+--------+--+--------+--+ VertebralPSV cm/s53EDV cm/s13 +---------+--------+--+--------+--+  ABI Findings: +---------+------------------+-----+-----------+----------+ Right    Rt Pressure (mmHg)IndexWaveform   Comment    +---------+------------------+-----+-----------+----------+ Brachial                        triphasic  restricted +---------+------------------+-----+-----------+----------+ PTA      146               1.09 multiphasic           +---------+------------------+-----+-----------+----------+ DP       129               0.96 multiphasic           +---------+------------------+-----+-----------+----------+ Great Toe103               0.77 Normal                +---------+------------------+-----+-----------+----------+ +---------+------------------+-----+-----------+-------+ Left     Lt Pressure (mmHg)IndexWaveform   Comment +---------+------------------+-----+-----------+-------+ Brachial 134                    triphasic          +---------+------------------+-----+-----------+-------+ PTA      116               0.87 multiphasic        +---------+------------------+-----+-----------+-------+ DP       119               0.89 multiphasic         +---------+------------------+-----+-----------+-------+ Great Toe96                0.72 Normal             +---------+------------------+-----+-----------+-------+  Right Doppler Findings: +--------+--------+-----+---------+----------+ Site    PressureIndexDoppler  Comments   +--------+--------+-----+---------+----------+ Brachial  triphasicrestricted +--------+--------+-----+---------+----------+ Radial               triphasic           +--------+--------+-----+---------+----------+ Ulnar                triphasic           +--------+--------+-----+---------+----------+  Left Doppler Findings: +--------+--------+-----+---------+--------+ Site    PressureIndexDoppler  Comments +--------+--------+-----+---------+--------+ [id number]          triphasic         +--------+--------+-----+---------+--------+ Radial               triphasic         +--------+--------+-----+---------+--------+ Ulnar                triphasic         +--------+--------+-----+---------+--------+   Summary: Right Carotid: Velocities in the right ICA are consistent with a 40-59%                stenosis. Left Carotid: Velocities in the left ICA are consistent with a 40-59% stenosis. Vertebrals:  Bilateral vertebral arteries demonstrate antegrade flow. Subclavians: Normal flow hemodynamics were seen in bilateral subclavian              arteries. Right ABI: Resting right ankle-brachial index is within normal range. The right toe-brachial index is normal. Left ABI: Resting left ankle-brachial index indicates mild left lower extremity arterial disease. The left toe-brachial index is normal. Right Upper Extremity: Doppler waveforms remain within normal limits with right radial compression. Doppler waveform obliterate with right ulnar compression. Left Upper Extremity: Doppler waveforms remain within normal limits with left radial compression. Doppler waveform obliterate with left ulnar  compression.  Electronically signed by Lemar Livings MD on 12/31/2022 at 7:05:01 PM.    Final     Cardiac Studies  LHC 12/28/22 Left Anterior Descending  Mid LAD lesion is 99% stenosed.    First Diagonal Branch  1st Diag lesion is 70% stenosed.    Ramus Intermedius  Vessel is small. There is moderate diffuse disease throughout the vessel.    Left Circumflex  Prox Cx lesion is 75% stenosed.    Right Coronary Artery  Mid RCA lesion is 100% stenosed.    Right Posterior Descending Artery  Collaterals  RPDA filled by collaterals from RV Branch.      Third Right Posterolateral Branch  Collaterals  3rd RPL filled by collaterals from 3rd Mrg.    Collaterals  3rd RPL filled by collaterals from 3rd Mrg.     Echo 12/29/22 1. Left ventricular ejection fraction, by estimation, is 60 to 65%. The  left ventricle has normal function. The left ventricle has no regional  wall motion abnormalities. Left ventricular diastolic parameters were  normal.   2. Right ventricular systolic function is normal. The right ventricular  size is normal.   3. Left atrial size was mildly dilated.   4. Trivial mitral valve regurgitation.   5. The aortic valve is tricuspid. Aortic valve regurgitation is not  visualized. Aortic valve sclerosis is present, with no evidence of aortic  valve stenosis.   6. The inferior vena cava is normal in size with greater than 50%  respiratory variability, suggesting right atrial pressure of 3 mmHg.   Patient Profile     62 y.o. female with hypertension, type 2 diabetes mellitus, dyslipidemia, and aspirin allergy.  Underwent cardiac catheterization on 12/28/2022 noting multivessel disease.  CT surgery following for plans for CABG today 01/01/23.  Assessment & Plan    Multivessel CAD -- presented with chest pain associated with nausea and diaphoresis. Troponin peak 38. EKG no ischemic changes -- Cath showed 99% stenosis mid LAD, first diagonal 70% stenosis, prox  circumflex 75% stenosis, mid RCA CTO.  -- No aspirin due to anaphylactic allergy -- continue lipitor 80mg  daily, metoprolol 12.5mg  BID, IV heparin -- had episode of rt arm tingling and numbness yesterday afternoon with no EKG changes, diaphoresis, or other symptoms. Rt cath site hematoma this AM with mild finger tingling. Cath site stable and soft.  -- CABG today   HLD -- very poorly controlled -- continue lipitor as above and vascepa 2g BID  DM2 -- continue SSI   For questions or updates, please contact [facility] Please consult [email or web address] for contact info under      Signed, Osborne Oman, RN Student Nurse Practitioner 01/01/2023, 7:32 AM

## 2023-01-01 NOTE — Plan of Care (Signed)

## 2023-01-01 NOTE — Consult Note (Signed)
NAME:  [name], MRN:  [id number], DOB:  1960/12/23, LOS: 5 ADMISSION DATE:  12/27/2022, CONSULTATION DATE: 01/01/2023 REFERRING MD: Dr. Cliffton Asters, CHIEF COMPLAINT: S/p CABG  History of Present Illness:  62 year old female with diabetes, hypertension and hyperlipidemia who initially presented with chest pain, was admitted with acute NSTEMI, underwent cardiac cath which showed multivessel coronary artery disease, today she underwent CABG x 3.  Remained intubated, PCCM was consulted for help evaluation medical management EBL 1600 Received 1 unit PRBC and 300 Cell Saver  Pertinent  Medical History   Past Medical History:  Diagnosis Date   Abdominal discomfort    Chronic abdominal and pelvic pain resulting in significant loss of time from work   Anxiety    with depression   Asthma    Chest pain    Depression    Diabetes mellitus    Drug overdose 06/12/2007   60 Naprosyn, psychiatric admission   Hyperlipidemia    Severe   Hypertension    Migraines    Tremor of both hands    on propranolol     Significant Hospital Events: Including procedures, antibiotic start and stop dates in addition to other pertinent events     Interim History / Subjective:  As above  Objective   Blood pressure (!) 143/74, pulse 74, temperature 98.7 F (37.1 C), temperature source Oral, resp. rate 17, height 5\' 4"  (1.626 m), weight 90.8 kg, SpO2 99%.    Vent Mode: SIMV;PRVC;PSV FiO2 (%):  [50 %] 50 % Set Rate:  [16 bmp] 16 bmp Vt Set:  [440 mL] 440 mL PEEP:  [5 cmH20] 5 cmH20 Pressure Support:  [10 cmH20] 10 cmH20 Plateau Pressure:  [16 cmH20] 16 cmH20   Intake/Output Summary (Last 24 hours) at 01/01/2023 1742 Last data filed at 01/01/2023 1649 Gross per 24 hour  Intake 2473 ml  Output 3625 ml  Net -1152 ml   Filed Weights   12/31/22 0420 01/01/23 0411 01/01/23 1056  Weight: 90.9 kg 90.8 kg 90.8 kg    Examination:   Physical exam: General: Crtitically ill-appearing female,  orally intubated HEENT: [place]/AT, eyes anicteric.  ETT and OGT in place Neuro: Sedated, not following commands.  Eyes are closed.  Pupils 3 mm bilateral reactive to light Chest: Central sternotomy wound looks clean and dry, coarse breath sounds, no wheezes or rhonchi.  Chest and mediastinal tubes in place Heart: Regular rate and rhythm, no murmurs or gallops Abdomen: Soft, nondistended, bowel sounds present Skin: No rash  Labs and images were reviewed  Resolved Hospital Problem list   Acute kidney injury  Assessment & Plan:  Coronary artery disease s/p CABG x 3 Patient is allergic to aspirin leading to anaphylactic shock Continue statin Chest tube management TCTS Continue to titrate Precedex with RASS goal 0/-1 Continue pain control with tramadol, oxycodone and morphine Closely monitor chest tube output  Acute respiratory insufficiency, postop Continue on protective ventilation VAP prevention bundle in place Rapid weaning protocol ordered is in place  Hypertension Holding antihypertensive for now  Hyperlipidemia Continue atorvastatin  Diabetes type 2 Patient hemoglobin A1c is 8.6 Currently on insulin infusion, monitor fingerstick with goal 140-180  Expected perioperative blood loss anemia Thrombocytopenia due to CPB S/p 1 unit of PRBC Monitor H&H and platelet count   Best Practice (right click and "Reselect all SmartList Selections" daily)   Diet/type: NPO DVT prophylaxis: SCD GI prophylaxis: PPI Lines: Central line, Arterial Line, and yes and it is still needed Foley:  Yes, and it  is still needed Code Status:  full code Last date of multidisciplinary goals of care discussion [Per primary team]  Labs   CBC: Recent Labs  Lab 12/27/22 1618 12/27/22 1635 12/28/22 0453 12/29/22 0516 12/30/22 0407 12/31/22 0403 01/01/23 0005 01/01/23 1420 01/01/23 1532 01/01/23 1538 01/01/23 1604 01/01/23 1624 01/01/23 1627  WBC 10.5  --  8.6 7.5 7.9 8.1 9.5  --   --   --    --   --   --   NEUTROABS 7.4  --   --   --   --   --   --   --   --   --   --   --   --   HGB 11.8*   < > 10.4* 10.7* 11.1* 10.7* 10.8*   < > 6.8* 6.8* 8.5* 8.8* 8.8*  HCT 36.4   < > 32.0* 32.9* 33.6* 32.8* 31.5*   < > 19.9* 20.0* 25.0* 26.0* 26.0*  MCV 93.1  --  92.8 90.6 89.4 90.6 89.5  --   --   --   --   --   --   PLT 204  --  191 175 189 189 192  --  119*  --   --   --   --    < > = values in this interval not displayed.    Basic Metabolic Panel: Recent Labs  Lab 12/28/22 0453 12/29/22 0516 12/30/22 0407 12/31/22 0403 01/01/23 0005 01/01/23 1420 01/01/23 1429 01/01/23 1503 01/01/23 1520 01/01/23 1538 01/01/23 1604 01/01/23 1624 01/01/23 1627  NA 134* 137 135 135 133* 136 137   < > 136 138 139 139 137  K 4.0 4.1 3.7 3.8 3.7 4.1 4.0   < > 4.4 4.1 5.2* 4.8 4.7  CL 108 107 105 107 106 109 108  --   --  107  --   --  109  CO2 16* 22 18* 19* 19*  --   --   --   --   --   --   --   --   GLUCOSE 262* 213* 210* 291* 261* 210* 201*  --   --  117*  --   --  158*  BUN 23 12 15 20  26* 22 22  --   --  18  --   --  18  CREATININE 0.96 0.88 0.95 1.14* 1.22* 0.90 0.80  --   --  0.70  --   --  0.70  CALCIUM 8.9 9.0 9.0 9.0 9.3  --   --   --   --   --   --   --   --   MG  --  1.3* 1.9  --   --   --   --   --   --   --   --   --   --    < > = values in this interval not displayed.   GFR: Estimated Creatinine Clearance: 79.5 mL/min (by C-G formula based on SCr of 0.7 mg/dL). Recent Labs  Lab 12/29/22 0516 12/30/22 0407 12/31/22 0403 01/01/23 0005  WBC 7.5 7.9 8.1 9.5    Liver Function Tests: Recent Labs  Lab 12/29/22 0516  AST 19  ALT 23  ALKPHOS 41  BILITOT 0.4  PROT 5.8*  ALBUMIN 3.3*   No results for input(s): "LIPASE", "AMYLASE" in the last 168 hours. No results for input(s): "AMMONIA" in the last 168 hours.  ABG    Component  Value Date/Time   PHART 7.430 01/01/2023 1624   PCO2ART 30.0 (L) 01/01/2023 1624   PO2ART 212 (H) 01/01/2023 1624   HCO3 19.9 (L)  01/01/2023 1624   TCO2 23 01/01/2023 1627   ACIDBASEDEF 4.0 (H) 01/01/2023 1624   O2SAT 100 01/01/2023 1624     Coagulation Profile: Recent Labs  Lab 12/31/22 2034  INR 1.0    Cardiac Enzymes: No results for input(s): "CKTOTAL", "CKMB", "CKMBINDEX", "TROPONINI" in the last 168 hours.  HbA1C: HB A1C (BAYER DCA - WAIVED)  Date/Time Value Ref Range Status  11/26/2022 04:17 PM 9.1 (H) 4.8 - 5.6 % Final    Comment:             Prediabetes: 5.7 - 6.4          Diabetes: >6.4          Glycemic control for adults with diabetes: <7.0   08/21/2022 03:46 PM 9.2 (H) 4.8 - 5.6 % Final    Comment:             Prediabetes: 5.7 - 6.4          Diabetes: >6.4          Glycemic control for adults with diabetes: <7.0    Hgb A1c MFr Bld  Date/Time Value Ref Range Status  12/31/2022 04:11 AM 8.6 (H) 4.8 - 5.6 % Final    Comment:    (NOTE) Pre diabetes:          5.7%-6.4%  Diabetes:              >6.4%  Glycemic control for   <7.0% adults with diabetes     CBG: Recent Labs  Lab 12/31/22 1539 12/31/22 2202 01/01/23 0750 01/01/23 1059 01/01/23 1723  GLUCAP 176* 277* 289* 210* 131*    Review of Systems:   Unable to obtain as patient is intubated and sedated  Past Medical History:  She,  has a past medical history of Abdominal discomfort, Anxiety, Asthma, Chest pain, Depression, Diabetes mellitus, Drug overdose (06/12/2007), Hyperlipidemia, Hypertension, Migraines, and Tremor of both hands.   Surgical History:   Past Surgical History:  Procedure Laterality Date   ABDOMINAL HYSTERECTOMY  02/10/2007   + lysis of adhesions   APPENDECTOMY  1990s   ruptured, late 90s   BILATERAL OOPHORECTOMY      in 2 surgeries prior to 9/08   CARPAL TUNNEL RELEASE Right 08/16/2021   Procedure: RIGHT CARPAL TUNNEL RELEASE;  Surgeon: Marlyne Beards, MD;  Location: [facility];  Service: Orthopedics;  Laterality: Right;   COLONOSCOPY  02/09/2010    normal upper endoscopy, single  colonic polyp,   HERNIA REPAIR     LEFT HEART CATH AND CORONARY ANGIOGRAPHY N/A 12/28/2022   Procedure: LEFT HEART CATH AND CORONARY ANGIOGRAPHY;  Surgeon: [name], MD;  Location: [facility];  Service: Cardiovascular;  Laterality: N/A;   VENTRAL HERNIA REPAIR     x3     Social History:   reports that she has never smoked. She has never used smokeless tobacco. She reports that she does not drink alcohol and does not use drugs.   Family History:  Her family history is not on file.   Allergies Allergies  Allergen Reactions   Aspirin Anaphylaxis   Bee Venom Anaphylaxis   Benadryl [Diphenhydramine Hcl] Anaphylaxis   Morphine And Codeine Shortness Of Breath and Itching   Vicks Formula 44 Cough-Cold Pm [Dm-Apap-Cpm] Shortness Of Breath and Rash  Not anaphylaxis     Home Medications  Prior to Admission medications   Medication Sig Start Date End Date Taking? Authorizing Provider  albuterol (VENTOLIN HFA) 108 (90 Base) MCG/ACT inhaler Inhale 2 puffs into the lungs every 4 (four) hours as needed for wheezing or shortness of breath. (NEEDS TO BE SEEN) 03/30/20  Yes Dettinger, Elige Radon, MD  atorvastatin (LIPITOR) 40 MG tablet Take 40 mg by mouth daily. 12/10/22  Yes [provider]  benazepril (LOTENSIN) 20 MG tablet Take 1 tablet (20 mg total) by mouth daily. 11/26/22  Yes Martin, Mary-Margaret, FNP  buPROPion (WELLBUTRIN XL) 150 MG 24 hr tablet TAKE 3 TABLETS BY MOUTH DAILY. 12/10/22  Yes Martin, Mary-Margaret, FNP  EPINEPHrine 0.3 mg/0.3 mL IJ SOAJ injection Inject 0.3 mg into the muscle as needed for anaphylaxis. 11/22/20  Yes Daphine Deutscher, Mary-Margaret, FNP  fenofibrate 160 MG tablet Take 1 tablet (160 mg total) by mouth daily. 11/26/22  Yes Martin, Mary-Margaret, FNP  glipiZIDE (GLUCOTROL XL) 10 MG 24 hr tablet Take 1 tablet (10 mg total) by mouth daily. 11/26/22  Yes Martin, Mary-Margaret, FNP  lisinopril (ZESTRIL) 10 MG tablet Take 1 tablet (10 mg total) by mouth daily.  11/26/22  Yes Daphine Deutscher, Mary-Margaret, FNP  metFORMIN (GLUCOPHAGE) 500 MG tablet Take 1 tablet (500 mg total) by mouth 2 (two) times daily with a meal. 11/26/22  Yes Daphine Deutscher, Mary-Margaret, FNP  propranolol (INDERAL) 40 MG tablet Take 1 tablet (40 mg total) by mouth 2 (two) times daily. (Needs to be seen before next refill) 11/26/22  Yes Daphine Deutscher, Mary-Margaret, FNP  rizatriptan (MAXALT) 10 MG tablet Take 1 tablet (10 mg total) by mouth as needed for migraine. May repeat in 2 hours if needed 07/23/22  Yes Daphine Deutscher, Mary-Margaret, FNP  RYBELSUS 7 MG TABS TAKE 1 TABLET (7 MG TOTAL) BY MOUTH DAILY 08/23/22  Yes Daphine Deutscher, Mary-Margaret, FNP  topiramate (TOPAMAX) 50 MG tablet Take 1 tablet (50 mg total) by mouth 2 (two) times daily. 11/26/22  Yes Daphine Deutscher, Mary-Margaret, FNP     Critical care time:     The patient is critically ill due to acute NSTEMI s/p CABG/acute respiratory insufficiency postprocedure.  Critical care was necessary to treat or prevent imminent or life-threatening deterioration.  Critical care was time spent personally by me on the following activities: development of treatment plan with patient and/or surrogate as well as nursing, discussions with consultants, evaluation of patient's response to treatment, examination of patient, obtaining history from patient or surrogate, ordering and performing treatments and interventions, ordering and review of laboratory studies, ordering and review of radiographic studies, pulse oximetry, re-evaluation of patient's condition and participation in multidisciplinary rounds.   During this encounter critical care time was devoted to patient care services described in this note for 34 minutes.     Cheri Fowler, MD [facility] See Amion for pager If no response to pager, please call [phone] until 7pm After 7pm, Please call E-link [phone]

## 2023-01-01 NOTE — Op Note (Signed)
[phone]     [address]       [place] [address]             [phone]                                          01/01/2023 Patient:  [name] Pre-Op Dx: CAD DM HTN Obesity   Post-op Dx:  same Procedure: CABG X 3.  LIMA LAD, RSVG PDA, OM   Endoscopic greater saphenous vein harvest on the right   Surgeon and Role:      * Eriko Economos, Eliezer Lofts, MD - Primary    * Gaynelle Arabian , PA-C - assisting An experienced assistant was required given the complexity of this surgery and the standard of surgical care. The assistant was needed for exposure, dissection, suctioning, retraction of delicate tissues and sutures, instrument exchange and for overall help during this procedure.    Anesthesia  general EBL:  Blood Administration: 1unit pRBCs Xclamp Time:  47 min Pump Time:   Drains: 19 F blake drain:  L, mediastinal  Wires: none Counts: correct   Indications: [name] is a 62 year old female with a past medical history notable for hypertension, type 2 diabetes mellitus, dyslipidemia, and aspirin allergy.  She is a lifelong non-smoker but has a strong family history of coronary artery disease.  She presented to a [facility] emergency room on 12/23/2022 after experiencing a bout of chest pain and was told it was not related to her heart.  On 12/27/22, she had a more severe episode of chest pain associated with nausea and diaphoresis while she was dining at [employer] with friends.  EMS was called and she was transported to [facility] ED where she was found to to be hypotensive with a blood pressure of 60/30.  Initial high-sensitivity troponin was 38 and repeat assay was 34.  EKG showed sinus rhythm with no acute ischemic changes.  A CT scan of the chest showed significant calcification of the aortic valve and three-vessel coronary calcification as well. BP was stabilized with fluid resuscitation and she was admitted to the  hospital with diagnosis of acute non-ST elevation myocardial infarction.  She was started on heparin and nitroglycerine infusions. She had an echocardiogram showing normal biventricular function with trivial MR and aortic valve sclerosis without significant stenosis.  Left heart catheterization was performed on 12/28/2022 revealing three-vessel coronary artery disease.  There is a 99% mid LAD stenosis.  The first diagonal has a long 70% proximal stenosis.  The circumflex has a proximal 75% stenosis before the first obtuse marginal.  The RCA is totally occluded near its midpoint.  Our right to right and left to right collaterals.   CT surgery has been asked to evaluate Mr. Couzens for consideration of coronary bypass grafting.  Findings: Good LIMA, and vein.  Good LAD, and OM.  Small PDA  Operative Technique: All invasive lines were placed in pre-op holding.  After the risks, benefits and alternatives were thoroughly discussed, the patient was brought to the operative theatre.  Anesthesia was induced, and the patient was prepped and draped in normal sterile fashion.  An appropriate surgical pause was performed, and pre-operative antibiotics were dosed accordingly.  We began with simultaneous incisions along the right leg for harvesting of the greater saphenous vein and the chest for the sternotomy.  In regards to the sternotomy, this was carried down with bovie cautery, and the sternum was divided with a reciprocating saw.  Meticulous hemostasis was obtained.  The left internal thoracic artery was exposed and harvested in in pedicled fashion.  The patient was systemically heparinized, and the artery was divided distally, and placed in a papaverine sponge.    The sternal elevator was removed, and a retractor was placed.  The pericardium was divided in the midline and fashioned into a cradle with pericardial stitches.   After we confirmed an appropriate ACT, the ascending aorta was cannulated in standard  fashion.  The right atrial appendage was used for venous cannulation site.  Cardiopulmonary bypass was initiated, and the heart retractor was placed. The cross clamp was applied, and a dose of anterograde cardioplegia was given with good arrest of the heart.  We moved to the posterior wall of the heart, and found a good target on the PDA.  An arteriotomy was made, and the vein graft was anastomosed to it in an end to side fashion.  Next we exposed the lateral wall, and found a good target on the OM.  An end to side anastomosis with the vein graft was then created.   Finally, we exposed a good target on the  LAD, and fashioned an end to side anastomosis between it and the LITA.  We began to re-warm, and a re-animation dose of cardioplegia was given.  The heart was de-aired, and the cross clamp was removed.  Meticulous hemostasis was obtained.    A partial occludding clamp was then placed on the ascending aorta, and we created an end to side anastomosis between it and the proximal vein grafts.  Rings were placed on the proximal anastomosis.  Hemostasis was obtained, and we separated from cardiopulmonary bypass without event.  The heparin was reversed with protamine.  Chest tubes and wires were placed, and the sternum was re-approximated with sternal wires.  The soft tissue and skin were re-approximated wth absorbable suture.    The patient tolerated the procedure without any immediate complications, and was transferred to the ICU in guarded condition.  Kitt Ledet Keane Scrape

## 2023-01-01 NOTE — Anesthesia Procedure Notes (Addendum)
Procedure Name: Intubation Date/Time: 01/01/2023 1:00 PM  Performed by: Alease Medina, CRNAPre-anesthesia Checklist: Patient identified, Emergency Drugs available, Suction available and Patient being monitored Patient Re-evaluated:Patient Re-evaluated prior to induction Oxygen Delivery Method: Circle system utilized Preoxygenation: Pre-oxygenation with 100% oxygen Induction Type: IV induction Ventilation: Mask ventilation without difficulty Laryngoscope Size: Mac and 3 Grade View: Grade II Tube type: Oral Tube size: 7.5 mm Number of attempts: 1 Airway Equipment and Method: Stylet and Oral airway Placement Confirmation: ETT inserted through vocal cords under direct vision, positive ETCO2 and breath sounds checked- equal and bilateral Secured at: 23 cm Tube secured with: Tape Dental Injury: Teeth and Oropharynx as per pre-operative assessment

## 2023-01-01 NOTE — Progress Notes (Addendum)
PROGRESS NOTE  [name] HQI:[id number] DOB: 1960-10-30 DOA: 12/27/2022 PCP: Bennie Pierini, FNP   LOS: 5 days   Brief Narrative / Interim history: 62 y.o. female with medical history significant for T2DM, HTN, HLD, depression/anxiety who presented to the ED for evaluation of chest pain and near syncopal episode. Patient states that for the last week she has been having intermittent chest pain.  This is described as pressure-like discomfort across her chest, to her back, and down her left arm.  This is worse with exertion.  She says normally walks her dogs 1.5 miles however since symptoms started she can barely walk a block before her symptoms occur.  Symptoms are associated with shortness of breath.  She has had the symptoms occasionally while at rest as well. She also reports nausea. She was seen by [facility] cardiology 12/24/2022 who recommended outpatient stress testing and echocardiogram, not yet performed. On the day of admission she was at a restaurant with friends when she suddenly became diaphoretic and nauseous while she was sitting down.   Subjective / 24h Interval events: Overnight MRI did confirm acute punctate CVA over the L occiput/cerebellum. Arm pain/parasthesias resolved overnight. R forearm hematoma also resolved with RICE treatment.  Assesement and Plan: Principal Problem:   Chest pain Active Problems:   Hypotension   AKI (acute kidney injury) (HCC)   Type 2 diabetes mellitus (HCC)   Hyperlipidemia associated with type 2 diabetes mellitus (HCC)   Non-ST elevation (NSTEMI) myocardial infarction [facility])   Coronary artery disease due to lipid rich plaque   Pure hypercholesterolemia  Chest pain in the setting of unstable angina, severe multivessel coronary artery disease -Cardiology following -Cardiac cath 7/19 shows severe multivessel disease LVEDP 13 mmHg -Tentatively scheduled CABG 7/23 -Asa allergy with anaphylaxis reported  Acute ischemic non-hemorrhagic  punctate infarcts of the L occiput; R cerebellum -Symptoms resolved - will continue close monitor of glucose, BP - will likely end up on antiplatelet/statin given above diagnosis -Will sideline Neuro for recommendations on discharge medications.  Hypotension with history of hypertension -Questionably secondary to above   Acute kidney injury, resolving -Likely secondary to hypotension as above   Type 2 diabetes, un controlled, with hyperglycemia  -Uncontrolled with A1c 9.1 -Continue sliding scale insulin, hypoglycemic protocol diabetic diet -Hold home glycemic medications   Hyperlipidemia - Continue atorvastatin. Lipid panel shows poorly controled profile   Depression/anxiety - Continue Wellbutrin. Code status: *Patient to be made full code at time of surgery - will re-address post operatively during recovery  Family Communication: no family at bedside   Status is: Inpatient  Remains inpatient appropriate because: severity of illness  Level of care: Progressive  Consultants:  Cardiology   Objective: Vitals:   12/31/22 2223 01/01/23 0033 01/01/23 0411 01/01/23 0752  BP: 117/60 117/70 128/80 116/68  Pulse: 71 60 66 64  Resp:  18 18 20   Temp:  97.8 F (36.6 C) 97.8 F (36.6 C) 98.4 F (36.9 C)  TempSrc:  Oral Oral Oral  SpO2:  100% 100% 94%  Weight:   90.8 kg   Height:        Intake/Output Summary (Last 24 hours) at 01/01/2023 0825 Last data filed at 01/01/2023 0417 Gross per 24 hour  Intake 3 ml  Output 750 ml  Net -747 ml   Wt Readings from Last 3 Encounters:  01/01/23 90.8 kg  11/26/22 93 kg  08/21/22 91.2 kg    Examination:  Constitutional: NAD Eyes: no scleral icterus ENMT: Mucous membranes are  moist.  Neck: normal, supple Respiratory: clear to auscultation bilaterally, no wheezing, no crackles.  Cardiovascular: Regular rate and rhythm, no murmurs / rubs / gallops.  Abdomen: non distended, no tenderness. Bowel sounds positive.  Extremities: R  anterior forearm ecchymosis 3x4cm  Data Reviewed: I have independently reviewed following labs and imaging studies   CBC Recent Labs  Lab 12/27/22 1618 12/27/22 1635 12/28/22 0453 12/29/22 0516 12/30/22 0407 12/31/22 0403 01/01/23 0005  WBC 10.5  --  8.6 7.5 7.9 8.1 9.5  HGB 11.8*   < > 10.4* 10.7* 11.1* 10.7* 10.8*  HCT 36.4   < > 32.0* 32.9* 33.6* 32.8* 31.5*  PLT 204  --  191 175 189 189 192  MCV 93.1  --  92.8 90.6 89.4 90.6 89.5  MCH 30.2  --  30.1 29.5 29.5 29.6 30.7  MCHC 32.4  --  32.5 32.5 33.0 32.6 34.3  RDW 13.6  --  13.8 13.7 13.4 13.2 13.1  LYMPHSABS 2.0  --   --   --   --   --   --   MONOABS 0.6  --   --   --   --   --   --   EOSABS 0.1  --   --   --   --   --   --   BASOSABS 0.1  --   --   --   --   --   --    < > = values in this interval not displayed.    Recent Labs  Lab 12/27/22 1618 12/27/22 1635 12/28/22 0453 12/29/22 0516 12/30/22 0407 12/31/22 0403 12/31/22 0411 12/31/22 2034 01/01/23 0005  NA 135   < > 134* 137 135 135  --   --  133*  K 3.8   < > 4.0 4.1 3.7 3.8  --   --  3.7  CL 108   < > 108 107 105 107  --   --  106  CO2 17*  --  16* 22 18* 19*  --   --  19*  GLUCOSE 294*   < > 262* 213* 210* 291*  --   --  261*  BUN 27*   < > 23 12 15 20   --   --  26*  CREATININE 1.19*   < > 0.96 0.88 0.95 1.14*  --   --  1.22*  CALCIUM 8.8*  --  8.9 9.0 9.0 9.0  --   --  9.3  AST  --   --   --  19  --   --   --   --   --   ALT  --   --   --  23  --   --   --   --   --   ALKPHOS  --   --   --  41  --   --   --   --   --   BILITOT  --   --   --  0.4  --   --   --   --   --   ALBUMIN  --   --   --  3.3*  --   --   --   --   --   MG  --   --   --  1.3* 1.9  --   --   --   --   DDIMER <0.27  --   --   --   --   --   --   --   --  INR  --   --   --   --   --   --   --  1.0  --   HGBA1C  --   --   --   --   --   --  8.6*  --   --   BNP 46.8  --   --   --   --   --   --   --   --    < > = values in this interval not displayed.    Lab Results   Component Value Date   HGBA1C 8.6 (H) 12/31/2022      Component Value Date/Time   BNP 46.8 12/27/2022 1618    CBG: Recent Labs  Lab 12/31/22 0700 12/31/22 1149 12/31/22 1539 12/31/22 2202 01/01/23 0750  GLUCAP 221* 243* 176* 277* 289*    Recent Results (from the past 240 hour(s))  SARS Coronavirus 2 by RT PCR (hospital order, performed in [facility] hospital lab) *cepheid single result test* Anterior Nasal Swab     Status: None   Collection Time: 12/27/22  4:18 PM   Specimen: Anterior Nasal Swab  Result Value Ref Range Status   SARS Coronavirus 2 by RT PCR NEGATIVE NEGATIVE Final    Comment: (NOTE) SARS-CoV-2 target nucleic acids are NOT DETECTED.  The SARS-CoV-2 RNA is generally detectable in upper and lower respiratory specimens during the acute phase of infection. The lowest concentration of SARS-CoV-2 viral copies this assay can detect is 250 copies / mL. A negative result does not preclude SARS-CoV-2 infection and should not be used as the sole basis for treatment or other patient management decisions.  A negative result may occur with improper specimen collection / handling, submission of specimen other than nasopharyngeal swab, presence of viral mutation(s) within the areas targeted by this assay, and inadequate number of viral copies (<250 copies / mL). A negative result must be combined with clinical observations, patient history, and epidemiological information.  Fact Sheet for Patients:   RoadLapTop.co.za  Fact Sheet for Healthcare Providers: http://kim-miller.com/  This test is not yet approved or  cleared by the [place] FDA and has been authorized for detection and/or diagnosis of SARS-CoV-2 by FDA under an Emergency Use Authorization (EUA).  This EUA will remain in effect (meaning this test can be used) for the duration of the COVID-19 declaration under Section 564(b)(1) of the Act, 21 U.S.C. section  360bbb-3(b)(1), unless the authorization is terminated or revoked sooner.  Performed at [facility], [address], [place], [place] [address]   Surgical pcr screen     Status: None   Collection Time: 12/31/22 11:22 PM   Specimen: Nasal Mucosa; Nasal Swab  Result Value Ref Range Status   MRSA, PCR NEGATIVE NEGATIVE Final   Staphylococcus aureus NEGATIVE NEGATIVE Final    Comment: (NOTE) The Xpert SA Assay (FDA approved for NASAL specimens in patients 51 years of age and older), is one component of a comprehensive surveillance program. It is not intended to diagnose infection nor to guide or monitor treatment. Performed at [facility], [address], [place], [place] [address]      Radiology Studies: DG Chest 2 View  Result Date: 12/31/2022 CLINICAL DATA:  Preop testing EXAM: CHEST - 2 VIEW COMPARISON:  Chest x-ray 12/27/2022 FINDINGS: The heart size and mediastinal contours are within normal limits. Both lungs are clear. The visualized skeletal structures are unremarkable. IMPRESSION: No active cardiopulmonary disease. Electronically Signed  By: Darliss Cheney M.D.   On: 12/31/2022 23:39   MR BRAIN WO CONTRAST  Result Date: 12/31/2022 CLINICAL DATA:  Initial evaluation for TIA. EXAM: MRI HEAD WITHOUT CONTRAST TECHNIQUE: Multiplanar, multiecho pulse sequences of the brain and surrounding structures were obtained without intravenous contrast. COMPARISON:  Prior CT from 04/25/2019. FINDINGS: Brain: Cerebral volume within normal limits. No significant cerebral white matter disease for age. Punctate acute ischemic infarcts seen involving the right cerebellum (series 2, image 11). Additional punctate cortical infarct present at the left occipital lobe (series 2, image 20). Probable subtle punctate subacute infarct at the left frontal centrum semi ovale (series 2, image 36). No associated hemorrhage or mass effect. No other evidence for acute or subacute ischemia.  Gray-white matter differentiation otherwise maintained. No other areas of chronic cortical infarction. No acute intracranial hemorrhage. Single punctate chronic microhemorrhage noted within the right cerebral hemisphere, of doubtful significance in isolation. Dilated perivascular space noted at the inferior left basal ganglia. No mass lesion, midline shift or mass effect. No hydrocephalus or extra-axial fluid collection. Pituitary gland suprasellar region within normal limits. Vascular: Major intracranial vascular flow voids are maintained. Skull and upper cervical spine: Craniocervical junction within normal limits. Bone marrow signal intensity within normal limits. No scalp soft tissue abnormality. Sinuses/Orbits: Globes and orbital soft tissues within normal limits. Mild mucosal thickening noted about the ethmoidal air cells and maxillary sinuses. No significant mastoid effusion. Other: None. IMPRESSION: 1. Punctate acute ischemic nonhemorrhagic infarcts involving the left occipital lobe and right cerebellum as above. 2. Additional probable punctate subacute ischemic nonhemorrhagic infarct involving the left frontal centrum semi ovale. 3. Otherwise normal brain MRI for age. Electronically Signed   By: Rise Mu M.D.   On: 12/31/2022 23:30   VAS US DOPPLER PRE CABG  Result Date: 12/31/2022 PREOPERATIVE VASCULAR EVALUATION Patient Name:  [name]  Date of Exam:   12/31/2022 Medical Rec #: [id number]             Accession #:    [id number] Date of Birth: 08/25/1960             Patient Gender: F Patient Age:   39 years Exam Location:  [facility] Procedure:      VAS US DOPPLER PRE CABG Referring Phys: MYRON RODDENBERRY --------------------------------------------------------------------------------  Indications:      Pre-CABG. Risk Factors:     Hypertension, hyperlipidemia, Diabetes, coronary artery                   disease. Comparison Study: No prior study Performing Technologist:  Sherren Kerns RVS Supporting Technologist: Shona Simpson  Examination Guidelines: A complete evaluation includes B-mode imaging, spectral Doppler, color Doppler, and power Doppler as needed of all accessible portions of each vessel. Bilateral testing is considered an integral part of a complete examination. Limited examinations for reoccurring indications may be performed as noted.  Right Carotid Findings: +----------+--------+--------+--------+--------+------------------+           PSV cm/sEDV cm/sStenosisDescribeComments           +----------+--------+--------+--------+--------+------------------+ CCA Prox  76      15                      intimal thickening +----------+--------+--------+--------+--------+------------------+ CCA Distal79      23                      intimal thickening +----------+--------+--------+--------+--------+------------------+ ICA Prox  211     57  40-59%  calcific                   +----------+--------+--------+--------+--------+------------------+ ICA Mid   121     51                                         +----------+--------+--------+--------+--------+------------------+ ICA Distal70      22                      tortuous           +----------+--------+--------+--------+--------+------------------+ ECA       352     12      >50%    calcific                   +----------+--------+--------+--------+--------+------------------+ +----------+--------+-------+--------+------------+           PSV cm/sEDV cmsDescribeArm Pressure +----------+--------+-------+--------+------------+ Subclavian129                                 +----------+--------+-------+--------+------------+ +---------+--------+--+--------+--+ VertebralPSV cm/s75EDV cm/s19 +---------+--------+--+--------+--+ Left Carotid Findings: +----------+--------+--------+--------+------------+------------------+           PSV cm/sEDV cm/sStenosisDescribe     Comments           +----------+--------+--------+--------+------------+------------------+ CCA Prox  113     21                          intimal thickening +----------+--------+--------+--------+------------+------------------+ CCA Distal116     22              heterogenous                   +----------+--------+--------+--------+------------+------------------+ ICA Prox  150     41      40-59%  calcific                       +----------+--------+--------+--------+------------+------------------+ ICA Mid   164     33                                             +----------+--------+--------+--------+------------+------------------+ ICA Distal99      34                          tortuous           +----------+--------+--------+--------+------------+------------------+ ECA       298     12              calcific                       +----------+--------+--------+--------+------------+------------------+ +----------+--------+--------+--------+------------+ SubclavianPSV cm/sEDV cm/sDescribeArm Pressure +----------+--------+--------+--------+------------+           131                                  +----------+--------+--------+--------+------------+ +---------+--------+--+--------+--+ VertebralPSV cm/s53EDV cm/s13 +---------+--------+--+--------+--+  ABI Findings: +---------+------------------+-----+-----------+----------+ Right    Rt Pressure (mmHg)IndexWaveform   Comment    +---------+------------------+-----+-----------+----------+ Brachial  triphasic  restricted +---------+------------------+-----+-----------+----------+ PTA      146               1.09 multiphasic           +---------+------------------+-----+-----------+----------+ DP       129               0.96 multiphasic           +---------+------------------+-----+-----------+----------+ Great Toe103               0.77 Normal                 +---------+------------------+-----+-----------+----------+ +---------+------------------+-----+-----------+-------+ Left     Lt Pressure (mmHg)IndexWaveform   Comment +---------+------------------+-----+-----------+-------+ Brachial 134                    triphasic          +---------+------------------+-----+-----------+-------+ PTA      116               0.87 multiphasic        +---------+------------------+-----+-----------+-------+ DP       119               0.89 multiphasic        +---------+------------------+-----+-----------+-------+ Great Toe96                0.72 Normal             +---------+------------------+-----+-----------+-------+  Right Doppler Findings: +--------+--------+-----+---------+----------+ Site    PressureIndexDoppler  Comments   +--------+--------+-----+---------+----------+ Brachial             triphasicrestricted +--------+--------+-----+---------+----------+ Radial               triphasic           +--------+--------+-----+---------+----------+ Ulnar                triphasic           +--------+--------+-----+---------+----------+  Left Doppler Findings: +--------+--------+-----+---------+--------+ Site    PressureIndexDoppler  Comments +--------+--------+-----+---------+--------+ [id number]          triphasic         +--------+--------+-----+---------+--------+ Radial               triphasic         +--------+--------+-----+---------+--------+ Ulnar                triphasic         +--------+--------+-----+---------+--------+   Summary: Right Carotid: Velocities in the right ICA are consistent with a 40-59%                stenosis. Left Carotid: Velocities in the left ICA are consistent with a 40-59% stenosis. Vertebrals:  Bilateral vertebral arteries demonstrate antegrade flow. Subclavians: Normal flow hemodynamics were seen in bilateral subclavian              arteries. Right ABI: Resting right  ankle-brachial index is within normal range. The right toe-brachial index is normal. Left ABI: Resting left ankle-brachial index indicates mild left lower extremity arterial disease. The left toe-brachial index is normal. Right Upper Extremity: Doppler waveforms remain within normal limits with right radial compression. Doppler waveform obliterate with right ulnar compression. Left Upper Extremity: Doppler waveforms remain within normal limits with left radial compression. Doppler waveform obliterate with left ulnar compression.  Electronically signed by Lemar Livings MD on 12/31/2022 at 7:05:01 PM.    Final     Carma Leaven DO [facility]  Between 7 pm - 7 am  I am not available, please contact night coverage MD/APP via Amion

## 2023-01-01 NOTE — Hospital Course (Addendum)
Referring: Mary Ku, MD Primary Care Provider: Bennie Pierini, FNP  History of Present Illness:     [name] is a 62 year old female with a past medical history notable for hypertension, type 2 diabetes mellitus, dyslipidemia, and aspirin allergy.  She is a lifelong non-smoker but has a strong family history of coronary artery disease.  She presented to a [facility] emergency room on 12/23/2022 after experiencing a bout of chest pain and was told it was not related to her heart.  On 12/27/22, she had a more severe episode of chest pain associated with nausea and diaphoresis while she was dining at [employer] with friends.  EMS was called and she was transported to [facility] ED where she was found to to be hypotensive with a blood pressure of 60/30.  Initial high-sensitivity troponin was 38 and repeat assay was 34.  EKG showed sinus rhythm with no acute ischemic changes.  A CT scan of the chest showed significant calcification of the aortic valve and three-vessel coronary calcification as well. BP was stabilized with fluid resuscitation and she was admitted to the hospital with diagnosis of acute non-ST elevation myocardial infarction.  She was started on heparin and nitroglycerine infusions. She had an echocardiogram showing normal biventricular function with trivial MR and aortic valve sclerosis without significant stenosis.  Left heart catheterization was performed on 12/28/2022 revealing three-vessel coronary artery disease.  There is a 99% mid LAD stenosis.  The first diagonal has a long 70% proximal stenosis.  The circumflex has a proximal 75% stenosis before the first obtuse marginal.  The RCA is totally occluded near its midpoint.  There are right to right and left to right collaterals.   CT surgery has been asked to evaluate [name] for consideration of coronary bypass grafting. [name] reports she has been having chest pain off and on throughout the spring  and summer.  She noticed this discomfort while walking her dogs on several occasions.  She had a particularly severe episode of chest pain July 4 holiday when she was moving several boxes into a new residence. She has had a few episodes of chest pain since admission.     [name] was recently widowed when she lost her husband during the COVID epidemic. She does not have any children and does not have any family in the local area.  She has lost 40lbs in the past year by walking, swimming and being more careful with her diet. She had been walking her dog about 1 mile three times daily before developing exertional angina. She is left handed.   [name] has multivessel coronary artery disease presenting with unstable angina pectoris, mild elevation in HS troponin, and preserved biventricular function.  Currently pain-free on heparin and nitroglycerin infusions.  We agree coronary bypass grafting is her best option for revascularization.  The procedure and expected perioperative course are discussed with [name] and her questions were answered.  She would like to proceed with surgery as soon as it can be scheduled.    Hospital Course:  [name] remained stable on heparin and NTG. She was taken to the OR on 01/01/23 where CABG x 3 was accomplished. Following the procedure, she was transferred to the surgical ICU in stable condition. Vital signs and hemodynamics were stable initially but she had a 6 sec sinus pause overnight after surgery. She was started on IV dobutamine. Beta blocking agents were held. She was weaned from the ventilator and extubated by  5am on post-op day 1 and mobilized later that day.  She had recovery of stable sinus rhythm. The Dobutamine was weaned off on post-op day 2 and metoprolol was added. Diuresis was initiated for expected post-operative volume excess.  She has diuresed well and it has subsequently been stopped. She was ready for transfer to [facility] on  post-op day 2 but remained in the ICU until a bed became available on postop day 3. She continued to progress with mobility. Supplemental oxygen was weaned off.  Diet was advanced routinely and well-tolerated. Most home diabetic medications were resumed, full regimen would be resumed at discharge. Her preoperative Hgb A1C was 8.6, she would require close follow up with her PCP. She had return appropriate bowel and bladder function.  Blood pressure was elevated and Cozaar was added. She has had some sinus pauses and beta-blocker has been discontinued. She had a burst of atrial fibrillation with RVR but quickly converted back to NSR without intervention, this was monitored closely and she was restarted on low dose Lopressor as discussed with Dr. Cliffton Asters. She lives alone without help, this was discussed with the case manager for possible short term SNF options but would not be approved for SNF due to her progression. She continued to have bursts of SVT possible atrial flutter with rates into the 150s, PO Amiodarone was started. She then had severe bradycardia with rate in the 20s, possible Mobitz type 2 heart block. EP was consulted and she was transferred back to the ICU for closer monitoring. EP recommended holding Amiodarone and all AV nodal blocking agents, she continued to have bursts of atrial flutter and SVT and would be placed on a ZIO monitor at discharge.  A concern for sleep apnea for which she was being worked up for but has not had a formal sleep study yet history is in regard to contributing to some of these SVT episodes.  Auto titration CPAP has been ordered by CCM but unfortunately she could not tolerate due to claustrophobia symptoms.  She does intend to have a sleep study and is hopeful for another type of device would be tolerated in the future.  Overall, at the time of discharge the patient is felt to be stable.

## 2023-01-01 NOTE — Progress Notes (Incomplete)
At approximately 2147, while patient on second phase of vent wean, off sedation and pressors, patient's rhythm slowed with a 4 second pause. Patient went back into NSR and OR pads were connected to zoll. Patient following commands. Patient nodded when asked if patient coughed.   At approximately 2156, patient went asystole. Started pacing patient on zoll, receiving capture. Patient responsive and still following commands. Patient placed back on full support on ventilator. [place] MD notified and received verbal order for dobutamine gtt @2 .5 mcg, and received okay to remove wound vac dressing to place zoll pads on chest and to continue to try to wean overnight.  Zoll pads applied, dobutamine gtt started, wound vac dressing discontinued. Zoll set at 30 mA and 50 PPM backup rate.

## 2023-01-01 NOTE — Progress Notes (Signed)
Re-education of incentive spirometer use after surgery provided.

## 2023-01-01 NOTE — Progress Notes (Signed)
     [address]       [place] [address]             [phone]       No events  Vitals:   01/01/23 0411 01/01/23 0752  BP: 128/80 116/68  Pulse: 66 64  Resp: 18 20  Temp: 97.8 F (36.6 C) 98.4 F (36.9 C)  SpO2: 100% 94%   Alert Nad Sinus EWOB  OR today for CABG  Alliah Boulanger O Charolette Bultman

## 2023-01-01 NOTE — Anesthesia Procedure Notes (Signed)
Arterial Line Insertion Start/End7/23/2024 11:51 AM, 01/01/2023 11:53 AM Performed by: Atilano Median, DO, anesthesiologist  Patient location: Pre-op. Preanesthetic checklist: patient identified, IV checked, site marked, risks and benefits discussed, surgical consent, monitors and equipment checked, pre-op evaluation, timeout performed and anesthesia consent Lidocaine 1% used for infiltration Left, radial was placed Catheter size: 20 G Hand hygiene performed  and maximum sterile barriers used   Attempts: 3 Procedure performed using ultrasound guided technique. Ultrasound Notes:anatomy identified, needle tip was noted to be adjacent to the nerve/plexus identified, no ultrasound evidence of intravascular and/or intraneural injection and image(s) printed for medical record Following insertion, dressing applied and Biopatch. Post procedure assessment: normal and unchanged  Post procedure complications: unsuccessful attempts, local hematoma and second provider assisted. Patient tolerated the procedure well with no immediate complications.

## 2023-01-01 NOTE — Progress Notes (Signed)
ANTICOAGULATION CONSULT NOTE- Follow Up  Pharmacy Consult for Heparin Indication:  severe multivessel CAD  Allergies  Allergen Reactions   Aspirin Anaphylaxis   Bee Venom Anaphylaxis   Benadryl [Diphenhydramine Hcl] Anaphylaxis   Morphine And Codeine Shortness Of Breath and Itching   Vicks Formula 44 Cough-Cold Pm [Dm-Apap-Cpm] Shortness Of Breath and Rash    Not anaphylaxis    Patient Measurements: Height: 5\' 4"  (162.6 cm) Weight: 90.8 kg (200 lb 1.6 oz) IBW/kg (Calculated) : 54.7 Heparin Dosing Weight: 75 kg  Vital Signs: Temp: 98.4 F (36.9 C) (07/23 0752) Temp Source: Oral (07/23 0752) BP: 116/68 (07/23 0752) Pulse Rate: 74 (07/23 1022)  Labs: Recent Labs     0000 12/29/22 1423 12/30/22 0407 12/30/22 1455 12/31/22 0403 12/31/22 1323 12/31/22 1429 12/31/22 2034 01/01/23 0005  HGB   < >  --  11.1*  --  10.7*  --   --   --  10.8*  HCT  --   --  33.6*  --  32.8*  --   --   --  31.5*  PLT  --   --  189  --  189  --   --   --  192  APTT  --   --  63*   < > 113*  --  88* 87* 93*  LABPROT  --   --   --   --   --   --   --  13.8  --   INR  --   --   --   --   --   --   --  1.0  --   HEPARINUNFRC  --  0.28* 0.51  --   --  0.78*  --   --   --   CREATININE  --   --  0.95  --  1.14*  --   --   --  1.22*   < > = values in this interval not displayed.    Estimated Creatinine Clearance: 52.2 mL/min (A) (by C-G formula based on SCr of 1.22 mg/dL (H)).   Medical History: Past Medical History:  Diagnosis Date   Abdominal discomfort    Chronic abdominal and pelvic pain resulting in significant loss of time from work   Anxiety    with depression   Asthma    Chest pain    Depression    Diabetes mellitus    Drug overdose 06/12/2007   60 Naprosyn, psychiatric admission   Hyperlipidemia    Severe   Hypertension    Migraines    Tremor of both hands    on propranolol    Medications:  Medications Prior to Admission  Medication Sig Dispense Refill Last Dose    albuterol (VENTOLIN HFA) 108 (90 Base) MCG/ACT inhaler Inhale 2 puffs into the lungs every 4 (four) hours as needed for wheezing or shortness of breath. (NEEDS TO BE SEEN) 18 g 1 unk   atorvastatin (LIPITOR) 40 MG tablet Take 40 mg by mouth daily.   12/27/2022   benazepril (LOTENSIN) 20 MG tablet Take 1 tablet (20 mg total) by mouth daily. 90 tablet 1 12/27/2022   buPROPion (WELLBUTRIN XL) 150 MG 24 hr tablet TAKE 3 TABLETS BY MOUTH DAILY. 270 tablet 0 12/27/2022   EPINEPHrine 0.3 mg/0.3 mL IJ SOAJ injection Inject 0.3 mg into the muscle as needed for anaphylaxis. 2 each 2 unk   fenofibrate 160 MG tablet Take 1 tablet (160 mg total) by mouth daily. 90 tablet  1 12/27/2022   glipiZIDE (GLUCOTROL XL) 10 MG 24 hr tablet Take 1 tablet (10 mg total) by mouth daily. 180 tablet 1 12/26/2022   lisinopril (ZESTRIL) 10 MG tablet Take 1 tablet (10 mg total) by mouth daily. 90 tablet 1 12/26/2022   metFORMIN (GLUCOPHAGE) 500 MG tablet Take 1 tablet (500 mg total) by mouth 2 (two) times daily with a meal. 180 tablet 1 12/27/2022 at am   propranolol (INDERAL) 40 MG tablet Take 1 tablet (40 mg total) by mouth 2 (two) times daily. (Needs to be seen before next refill) 180 tablet 1 Past Week   rizatriptan (MAXALT) 10 MG tablet Take 1 tablet (10 mg total) by mouth as needed for migraine. May repeat in 2 hours if needed 10 tablet 2 unk   RYBELSUS 7 MG TABS TAKE 1 TABLET (7 MG TOTAL) BY MOUTH DAILY 90 tablet 1 12/27/2022   topiramate (TOPAMAX) 50 MG tablet Take 1 tablet (50 mg total) by mouth 2 (two) times daily. 180 tablet 1 12/27/2022    Assessment: 62 y.o. F presented with chest pain. Found to have severe multivessel disease on cath. Heparin was started 2 hours post TR band removal. TR band removed 7/19 2100, for CVTS consult. Noted that patient has elevated triglycerides, which could alter heparin anti-Xa level results. Assessed both levels on 7/21 to determine the correlation.   Will rely on aPTT results for dose  adjustments in the setting of elevated triglycerides.    aPTT 93- therapeutic on 1650 units/h. CBC stable  Goal of Therapy:  aPTT 66-102 seconds Monitor platelets by anticoagulation protocol: Yes   Plan:  Continue heparin gtt at 1650 units/hr Plans for CABG today  Harland German, PharmD Clinical Pharmacist **Pharmacist phone directory can now be found on [email or web address] ([email or web address]).  Listed under [facility].  e

## 2023-01-01 NOTE — Anesthesia Preprocedure Evaluation (Addendum)
Anesthesia Evaluation  Patient identified by MRN, date of birth, ID band Patient awake    Reviewed: Allergy & Precautions, NPO status , Patient's Chart, lab work & pertinent test results  Airway Mallampati: II  TM Distance: >3 FB Neck ROM: Full    Dental no notable dental hx.    Pulmonary asthma    Pulmonary exam normal        Cardiovascular hypertension, Pt. on medications and Pt. on home beta blockers + CAD and + Past MI   Rhythm:Regular Rate:Normal  ECHO 07/24:   1. Left ventricular ejection fraction, by estimation, is 60 to 65%. The left ventricle has normal function. The left ventricle has no regional wall motion abnormalities. Left ventricular diastolic parameters were normal.  2. Right ventricular systolic function is normal. The right ventricular size is normal.  3. Left atrial size was mildly dilated.  4. Trivial mitral valve regurgitation.  5. The aortic valve is tricuspid. Aortic valve regurgitation is not visualized. Aortic valve sclerosis is present, with no evidence of aortic valve stenosis.  6. The inferior vena cava is normal in size with greater than 50% respiratory variability, suggesting right atrial pressure of 3 mmHg.      Neuro/Psych  Headaches  Anxiety Depression       GI/Hepatic negative GI ROS, Neg liver ROS,,,  Endo/Other  diabetes, Type 2, Oral Hypoglycemic AgentsHypothyroidism    Renal/GU   negative genitourinary   Musculoskeletal negative musculoskeletal ROS (+)    Abdominal Normal abdominal exam  (+)   Peds  Hematology negative hematology ROS (+) Lab Results      Component                Value               Date                      WBC                      9.5                 01/01/2023                HGB                      10.8 (L)            01/01/2023                HCT                      31.5 (L)            01/01/2023                MCV                      89.5                 01/01/2023                PLT                      192                 01/01/2023              Anesthesia Other Findings  Reproductive/Obstetrics                             Anesthesia Physical Anesthesia Plan  ASA: 4  Anesthesia Plan: General   Post-op Pain Management:    Induction: Intravenous  PONV Risk Score and Plan: 3 and Midazolam and Treatment may vary due to age or medical condition  Airway Management Planned: Mask and Oral ETT  Additional Equipment: Arterial line, CVP, TEE, 3D TEE and Ultrasound Guidance Line Placement  Intra-op Plan:   Post-operative Plan: Post-operative intubation/ventilation  Informed Consent: I have reviewed the patients History and Physical, chart, labs and discussed the procedure including the risks, benefits and alternatives for the proposed anesthesia with the patient or authorized representative who has indicated his/her understanding and acceptance.     Dental advisory given  Plan Discussed with: CRNA  Anesthesia Plan Comments:        Anesthesia Quick Evaluation

## 2023-01-02 ENCOUNTER — Inpatient Hospital Stay (HOSPITAL_COMMUNITY): Payer: 59

## 2023-01-02 ENCOUNTER — Institutional Professional Consult (permissible substitution): Payer: 59 | Admitting: Neurology

## 2023-01-02 ENCOUNTER — Encounter (HOSPITAL_COMMUNITY): Payer: Self-pay | Admitting: Thoracic Surgery (Cardiothoracic Vascular Surgery)

## 2023-01-02 DIAGNOSIS — E7800 Pure hypercholesterolemia, unspecified: Secondary | ICD-10-CM | POA: Diagnosis not present

## 2023-01-02 DIAGNOSIS — I2583 Coronary atherosclerosis due to lipid rich plaque: Secondary | ICD-10-CM | POA: Diagnosis not present

## 2023-01-02 DIAGNOSIS — I2510 Atherosclerotic heart disease of native coronary artery without angina pectoris: Secondary | ICD-10-CM | POA: Diagnosis not present

## 2023-01-02 DIAGNOSIS — N179 Acute kidney failure, unspecified: Secondary | ICD-10-CM

## 2023-01-02 DIAGNOSIS — Z951 Presence of aortocoronary bypass graft: Secondary | ICD-10-CM | POA: Diagnosis not present

## 2023-01-02 DIAGNOSIS — I214 Non-ST elevation (NSTEMI) myocardial infarction: Secondary | ICD-10-CM | POA: Diagnosis not present

## 2023-01-02 DIAGNOSIS — E1142 Type 2 diabetes mellitus with diabetic polyneuropathy: Secondary | ICD-10-CM | POA: Diagnosis not present

## 2023-01-02 LAB — TYPE AND SCREEN
Antibody Screen: NEGATIVE
Unit division: 0
Unit division: 0

## 2023-01-02 LAB — GLUCOSE, CAPILLARY
Glucose-Capillary: 131 mg/dL — ABNORMAL HIGH (ref 70–99)
Glucose-Capillary: 131 mg/dL — ABNORMAL HIGH (ref 70–99)
Glucose-Capillary: 135 mg/dL — ABNORMAL HIGH (ref 70–99)
Glucose-Capillary: 145 mg/dL — ABNORMAL HIGH (ref 70–99)
Glucose-Capillary: 149 mg/dL — ABNORMAL HIGH (ref 70–99)
Glucose-Capillary: 153 mg/dL — ABNORMAL HIGH (ref 70–99)
Glucose-Capillary: 156 mg/dL — ABNORMAL HIGH (ref 70–99)
Glucose-Capillary: 157 mg/dL — ABNORMAL HIGH (ref 70–99)
Glucose-Capillary: 161 mg/dL — ABNORMAL HIGH (ref 70–99)
Glucose-Capillary: 170 mg/dL — ABNORMAL HIGH (ref 70–99)
Glucose-Capillary: 207 mg/dL — ABNORMAL HIGH (ref 70–99)
Glucose-Capillary: 213 mg/dL — ABNORMAL HIGH (ref 70–99)
Glucose-Capillary: 214 mg/dL — ABNORMAL HIGH (ref 70–99)
Glucose-Capillary: 227 mg/dL — ABNORMAL HIGH (ref 70–99)

## 2023-01-02 LAB — BASIC METABOLIC PANEL
Anion gap: 8 (ref 5–15)
Anion gap: 6 (ref 5–15)
BUN: 14 mg/dL (ref 8–23)
BUN: 12 mg/dL (ref 8–23)
CO2: 22 mmol/L (ref 22–32)
CO2: 19 mmol/L — ABNORMAL LOW (ref 22–32)
Calcium: 8.1 mg/dL — ABNORMAL LOW (ref 8.9–10.3)
Calcium: 7.7 mg/dL — ABNORMAL LOW (ref 8.9–10.3)
Chloride: 109 mmol/L (ref 98–111)
Chloride: 108 mmol/L (ref 98–111)
Creatinine, Ser: 0.87 mg/dL (ref 0.44–1.00)
Creatinine, Ser: 0.85 mg/dL (ref 0.44–1.00)
GFR, Estimated: >60 mL/min (ref >60)
GFR, Estimated: >60 mL/min (ref >60)
Glucose, Bld: 150 mg/dL — ABNORMAL HIGH (ref 70–99)
Glucose, Bld: 234 mg/dL — ABNORMAL HIGH (ref 70–99)
Potassium: 3.3 mmol/L — ABNORMAL LOW (ref 3.5–5.1)
Potassium: 3.6 mmol/L (ref 3.5–5.1)
Sodium: 139 mmol/L (ref 135–145)
Sodium: 133 mmol/L — ABNORMAL LOW (ref 135–145)

## 2023-01-02 LAB — CBC
HCT: 31.3 % — ABNORMAL LOW (ref 36.0–46.0)
HCT: 31.1 % — ABNORMAL LOW (ref 36.0–46.0)
Hemoglobin: 11 g/dL — ABNORMAL LOW (ref 12.0–15.0)
Hemoglobin: 10.6 g/dL — ABNORMAL LOW (ref 12.0–15.0)
MCH: 30.9 pg (ref 26.0–34.0)
MCH: 30.7 pg (ref 26.0–34.0)
MCHC: 35.1 g/dL (ref 30.0–36.0)
MCHC: 34.1 g/dL (ref 30.0–36.0)
MCV: 87.9 fL (ref 80.0–100.0)
MCV: 90.1 fL (ref 80.0–100.0)
Platelets: 138 10*3/uL — ABNORMAL LOW (ref 150–400)
Platelets: 125 10*3/uL — ABNORMAL LOW (ref 150–400)
RBC: 3.56 MIL/uL — ABNORMAL LOW (ref 3.87–5.11)
RBC: 3.45 MIL/uL — ABNORMAL LOW (ref 3.87–5.11)
RDW: 13.5 % (ref 11.5–15.5)
RDW: 14.2 % (ref 11.5–15.5)
WBC: 16.2 10*3/uL — ABNORMAL HIGH (ref 4.0–10.5)
WBC: 16.1 10*3/uL — ABNORMAL HIGH (ref 4.0–10.5)
nRBC: 0 % (ref 0.0–0.2)
nRBC: 0 % (ref 0.0–0.2)

## 2023-01-02 LAB — BPAM RBC
Blood Product Expiration Date: 202408142359
Blood Product Expiration Date: 202408162359
Blood Product Expiration Date: 202408162359
Blood Product Expiration Date: 202408182359
ISSUE DATE / TIME: 202407232059
Unit Type and Rh: 6200

## 2023-01-02 LAB — POCT I-STAT 7, (LYTES, BLD GAS, ICA,H+H)
Acid-base deficit: 9 mmol/L — ABNORMAL HIGH (ref 0.0–2.0)
Acid-base deficit: 5 mmol/L — ABNORMAL HIGH (ref 0.0–2.0)
Bicarbonate: 16.9 mmol/L — ABNORMAL LOW (ref 20.0–28.0)
Bicarbonate: 19.4 mmol/L — ABNORMAL LOW (ref 20.0–28.0)
Calcium, Ion: 1.17 mmol/L (ref 1.15–1.40)
Calcium, Ion: 1.16 mmol/L (ref 1.15–1.40)
HCT: 31 % — ABNORMAL LOW (ref 36.0–46.0)
HCT: 31 % — ABNORMAL LOW (ref 36.0–46.0)
Hemoglobin: 10.5 g/dL — ABNORMAL LOW (ref 12.0–15.0)
Hemoglobin: 10.5 g/dL — ABNORMAL LOW (ref 12.0–15.0)
O2 Saturation: 98 %
O2 Saturation: 96 %
Patient temperature: 37.4
Patient temperature: 37.6
Potassium: 3.6 mmol/L (ref 3.5–5.1)
Potassium: 3.4 mmol/L — ABNORMAL LOW (ref 3.5–5.1)
Sodium: 143 mmol/L (ref 135–145)
Sodium: 144 mmol/L (ref 135–145)
TCO2: 18 mmol/L — ABNORMAL LOW (ref 22–32)
TCO2: 20 mmol/L — ABNORMAL LOW (ref 22–32)
pCO2 arterial: 34.7 mmHg (ref 32–48)
pCO2 arterial: 34 mmHg (ref 32–48)
pH, Arterial: 7.297 — ABNORMAL LOW (ref 7.35–7.45)
pH, Arterial: 7.366 (ref 7.35–7.45)
pO2, Arterial: 115 mmHg — ABNORMAL HIGH (ref 83–108)
pO2, Arterial: 87 mmHg (ref 83–108)

## 2023-01-02 LAB — MAGNESIUM
Magnesium: 2.2 mg/dL (ref 1.7–2.4)
Magnesium: 1.9 mg/dL (ref 1.7–2.4)

## 2023-01-02 MED ORDER — OXYCODONE HCL 5 MG PO TABS
5.0000 mg | ORAL_TABLET | ORAL | Status: DC | PRN
  Administered 2023-01-02 – 2023-01-03 (×3): 5 mg via ORAL
  Administered 2023-01-04 – 2023-01-10 (×3): 10 mg via ORAL
  Filled 2023-01-02 (×3): qty 2
  Filled 2023-01-02 (×2): qty 1

## 2023-01-02 MED ORDER — POTASSIUM CHLORIDE 10 MEQ/50ML IV SOLN
10.0000 meq | INTRAVENOUS | Status: AC
  Administered 2023-01-02 (×2): 10 meq via INTRAVENOUS
  Filled 2023-01-02 (×2): qty 50

## 2023-01-02 MED ORDER — SODIUM BICARBONATE 8.4 % IV SOLN
INTRAVENOUS | Status: AC
  Administered 2023-01-02: 100 meq via INTRAVENOUS
  Filled 2023-01-02: qty 100

## 2023-01-02 MED ORDER — ORAL CARE MOUTH RINSE: 15.0000 mL | OROMUCOSAL | Status: DC | PRN

## 2023-01-02 MED ORDER — SODIUM BICARBONATE 8.4 % IV SOLN: 100.0000 meq | Freq: Once | INTRAVENOUS | Status: AC

## 2023-01-02 MED ORDER — INSULIN ASPART 100 UNIT/ML IJ SOLN
0.0000 [IU] | INTRAMUSCULAR | Status: DC
  Administered 2023-01-02: 8 [IU] via SUBCUTANEOUS
  Administered 2023-01-02: 2 [IU] via SUBCUTANEOUS
  Administered 2023-01-02 – 2023-01-03 (×5): 8 [IU] via SUBCUTANEOUS
  Administered 2023-01-03 – 2023-01-04 (×6): 4 [IU] via SUBCUTANEOUS
  Administered 2023-01-04: 8 [IU] via SUBCUTANEOUS
  Administered 2023-01-04: 12 [IU] via SUBCUTANEOUS
  Administered 2023-01-04: 8 [IU] via SUBCUTANEOUS
  Administered 2023-01-05 (×3): 4 [IU] via SUBCUTANEOUS
  Administered 2023-01-05: 8 [IU] via SUBCUTANEOUS
  Administered 2023-01-05 – 2023-01-06 (×2): 2 [IU] via SUBCUTANEOUS
  Administered 2023-01-06: 8 [IU] via SUBCUTANEOUS
  Administered 2023-01-06: 4 [IU] via SUBCUTANEOUS
  Administered 2023-01-06 – 2023-01-07 (×5): 2 [IU] via SUBCUTANEOUS
  Administered 2023-01-07: 8 [IU] via SUBCUTANEOUS
  Administered 2023-01-07: 2 [IU] via SUBCUTANEOUS

## 2023-01-02 MED ORDER — ENOXAPARIN SODIUM 40 MG/0.4ML IJ SOSY
40.0000 mg | PREFILLED_SYRINGE | Freq: Every day | INTRAMUSCULAR | Status: DC
  Administered 2023-01-02 – 2023-01-10 (×9): 40 mg via SUBCUTANEOUS
  Filled 2023-01-02 (×9): qty 0.4

## 2023-01-02 MED ORDER — POTASSIUM CHLORIDE CRYS ER 20 MEQ PO TBCR
20.0000 meq | EXTENDED_RELEASE_TABLET | ORAL | Status: AC
  Administered 2023-01-02 – 2023-01-03 (×3): 20 meq via ORAL
  Filled 2023-01-02 (×3): qty 1

## 2023-01-02 MED ORDER — POTASSIUM CHLORIDE 10 MEQ/50ML IV SOLN
INTRAVENOUS | Status: AC
  Administered 2023-01-02: 10 meq via INTRAVENOUS
  Filled 2023-01-02: qty 50

## 2023-01-02 MED ORDER — DOCUSATE SODIUM 100 MG PO CAPS
200.0000 mg | ORAL_CAPSULE | Freq: Two times a day (BID) | ORAL | Status: DC
  Administered 2023-01-02 – 2023-01-11 (×12): 200 mg via ORAL
  Filled 2023-01-02 (×14): qty 2

## 2023-01-02 MED ORDER — HEPARIN SODIUM (PORCINE) 1000 UNIT/ML IJ SOLN
INTRAMUSCULAR | Status: DC | PRN
  Administered 2023-01-01: 29000 [IU] via INTRAVENOUS

## 2023-01-02 MED FILL — Heparin Sodium (Porcine) Inj 1000 Unit/ML: Qty: 1000 | Status: AC

## 2023-01-02 MED FILL — Lidocaine HCl Local Preservative Free (PF) Inj 2%: INTRAMUSCULAR | Qty: 14 | Status: AC

## 2023-01-02 MED FILL — Potassium Chloride Inj 2 mEq/ML: INTRAVENOUS | Qty: 40 | Status: AC

## 2023-01-02 MED FILL — Cefazolin Sodium-Dextrose IV Solution 2 GM/100ML-4%: INTRAVENOUS | Qty: 100 | Status: AC

## 2023-01-02 NOTE — Progress Notes (Signed)
PROGRESS NOTE  [name] BMW:[id number] DOB: 1961/03/09 DOA: 12/27/2022 PCP: Bennie Pierini, FNP   LOS: 6 days   Brief Narrative / Interim history: 62 y.o. female with medical history significant for T2DM, HTN, HLD, depression/anxiety who presented to the ED for evaluation of chest pain and near syncopal episode. Patient states that for the last week she has been having intermittent chest pain.  This is described as pressure-like discomfort across her chest, to her back, and down her left arm.  This is worse with exertion.  She says normally walks her dogs 1.5 miles however since symptoms started she can barely walk a block before her symptoms occur.  Symptoms are associated with shortness of breath.  She has had the symptoms occasionally while at rest as well. She also reports nausea. She was seen by [facility] cardiology 12/24/2022 who recommended outpatient stress testing and echocardiogram, not yet performed. On the day of admission she was at a restaurant with friends when she suddenly became diaphoretic and nauseous while she was sitting down.   Subjective / 24h Interval events: .  Assesement and Plan: Principal Problem:   Chest pain Active Problems:   Hypotension   AKI (acute kidney injury) (HCC)   Type 2 diabetes mellitus (HCC)   Hyperlipidemia associated with type 2 diabetes mellitus (HCC)   Non-ST elevation (NSTEMI) myocardial infarction [facility])   Coronary artery disease due to lipid rich plaque   Pure hypercholesterolemia   S/P CABG x 3  Chest pain in the setting of unstable angina, severe multivessel coronary artery disease -Cardiology following -Cardiac cath 7/19 shows severe multivessel disease LVEDP 13 mmHg -Three-vessel CABG 7/23 -required dobutamine overnight, ongoing -Asa allergy with anaphylaxis reported  Acute ischemic non-hemorrhagic punctate infarcts of the L occiput; R cerebellum -Initial event 7/22, symptoms resolved overnight no further episodes   -MRI does confirm acute ischemic nonhemorrhagic punctate infarct -Given patient's recent surgery requiring multiple medications and ICU coverage we will hold off on any further stroke workup at this time until more stabilized -Will sideline Neuro for recommendations on discharge medications.  Leukocytosis, likely reactive -Secondary to recent procedure, no signs or symptoms of infection, continue to follow closely  Hypotension with history of hypertension -Questionably secondary to above   Acute kidney injury, resolved -Likely secondary to initial hypotension as above   Type 2 diabetes, uncontrolled, with hyperglycemia  -Uncontrolled with A1c 9.1 -Continue sliding scale insulin, hypoglycemic protocol diabetic diet -Hold home glycemic medications -Advance diet as tolerated back to diabetic diet   Hyperlipidemia - Continue atorvastatin. Lipid panel shows poorly controled profile   Depression/anxiety - Continue Wellbutrin. Code status: Full code lengthy discussion with patient and friend at bedside, it appears patient was unclear about what full code versus DNR meant.  Recommend follow-up with MOST form as her wishes are essentially not to be maintained in a vegetative state if recovery is unclear, but otherwise continue all medical care.  Family Communication: no family at bedside, offered to call Sister -patient to update  Status is: Inpatient Remains inpatient appropriate because: severity of illness Level of care: ICU  Consultants:  Cardiology, cardiothoracic surgery, PCCM  Objective: Vitals:   01/02/23 0620 01/02/23 0630 01/02/23 0645 01/02/23 0700  BP:    (!) 104/41  Pulse: 86 92 90 86  Resp: 13 13 16 11   Temp: 99.9 F (37.7 C) 99.9 F (37.7 C) 99.9 F (37.7 C) 99.9 F (37.7 C)  TempSrc:      SpO2: 98% 98% 97% 97%  Weight:      Height:        Intake/Output Summary (Last 24 hours) at 01/02/2023 0744 Last data filed at 01/02/2023 0700 Gross per 24 hour  Intake  5643.14 ml  Output 4580 ml  Net 1063.14 ml   Wt Readings from Last 3 Encounters:  01/02/23 97.4 kg  11/26/22 93 kg  08/21/22 91.2 kg    Examination:  Constitutional: NAD Eyes: no scleral icterus ENMT: Mucous membranes are moist.  Neck: normal, supple Respiratory: clear to auscultation bilaterally, no wheezing, no crackles.  Cardiovascular: Central sternotomy wound clean dry intact, left pleural and mediastinal tube clean dry intact draining serosanguineous fluid.  Regular rate rhythm.  Abdomen: non distended, no tenderness. Bowel sounds positive.  Extremities: Without overt edema/focal neurodeficits  Data Reviewed: I have independently reviewed following labs and imaging studies   CBC Recent Labs  Lab 12/27/22 1618 12/27/22 1635 12/31/22 0403 01/01/23 0005 01/01/23 1420 01/01/23 1532 01/01/23 1538 01/01/23 1729 01/01/23 1914 01/01/23 2200 01/02/23 0157 01/02/23 0334 01/02/23 0401  WBC 10.5   < > 8.1 9.5  --   --   --  13.0*  --  19.4*  --   --  16.2*  HGB 11.8*   < > 10.7* 10.8*   < > 6.8*   < > 10.7* 10.9* 11.7* 10.5* 10.5* 11.0*  HCT 36.4   < > 32.8* 31.5*   < > 19.9*   < > 31.2* 32.0* 34.2* 31.0* 31.0* 31.3*  PLT 204   < > 189 192  --  119*  --  109*  --  156  --   --  138*  MCV 93.1   < > 90.6 89.5  --   --   --  89.9  --  87.0  --   --  87.9  MCH 30.2   < > 29.6 30.7  --   --   --  30.8  --  29.8  --   --  30.9  MCHC 32.4   < > 32.6 34.3  --   --   --  34.3  --  34.2  --   --  35.1  RDW 13.6   < > 13.2 13.1  --   --   --  13.0  --  13.2  --   --  13.5  LYMPHSABS 2.0  --   --   --   --   --   --   --   --   --   --   --   --   MONOABS 0.6  --   --   --   --   --   --   --   --   --   --   --   --   EOSABS 0.1  --   --   --   --   --   --   --   --   --   --   --   --   BASOSABS 0.1  --   --   --   --   --   --   --   --   --   --   --   --    < > = values in this interval not displayed.    Recent Labs  Lab 12/27/22 1618 12/27/22 1635 12/29/22 7846  12/30/22 0407 12/31/22 0403 12/31/22 0411 12/31/22 2034 01/01/23 0005 01/01/23 1420 01/01/23 1429 01/01/23 1503  01/01/23 1538 01/01/23 1604 01/01/23 1627 01/01/23 1726 01/01/23 1729 01/01/23 1914 01/01/23 2200 01/02/23 0157 01/02/23 0334 01/02/23 0401  NA 135   < > 137 135 135  --   --  133*   < > 137   < > 138   < > 137   < >  --  143 137 143 144 139  K 3.8   < > 4.1 3.7 3.8  --   --  3.7   < > 4.0   < > 4.1   < > 4.7   < >  --  3.9 4.5 3.6 3.4* 3.3*  CL 108   < > 107 105 107  --   --  106   < > 108  --  107  --  109  --   --   --  110  --   --  109  CO2 17*   < > 22 18* 19*  --   --  19*  --   --   --   --   --   --   --   --   --  20*  --   --  22  GLUCOSE 294*   < > 213* 210* 291*  --   --  261*   < > 201*  --  117*  --  158*  --   --   --  190*  --   --  150*  BUN 27*   < > 12 15 20   --   --  26*   < > 22  --  18  --  18  --   --   --  16  --   --  14  CREATININE 1.19*   < > 0.88 0.95 1.14*  --   --  1.22*   < > 0.80  --  0.70  --  0.70  --   --   --  0.91  --   --  0.87  CALCIUM 8.8*   < > 9.0 9.0 9.0  --   --  9.3  --   --   --   --   --   --   --   --   --  7.7*  --   --  8.1*  AST  --   --  19  --   --   --   --   --   --   --   --   --   --   --   --   --   --   --   --   --   --   ALT  --   --  23  --   --   --   --   --   --   --   --   --   --   --   --   --   --   --   --   --   --   ALKPHOS  --   --  41  --   --   --   --   --   --   --   --   --   --   --   --   --   --   --   --   --   --   BILITOT  --   --  0.4  --   --   --   --   --   --   --   --   --   --   --   --   --   --   --   --   --   --  ALBUMIN  --   --  3.3*  --   --   --   --   --   --   --   --   --   --   --   --   --   --   --   --   --   --   MG  --   --  1.3* 1.9  --   --   --   --   --   --   --   --   --   --   --   --   --  2.9*  --   --  2.2  DDIMER <0.27  --   --   --   --   --   --   --   --   --   --   --   --   --   --   --   --   --   --   --   --   INR  --   --   --   --   --   --   1.0  --   --   --   --   --   --   --   --  1.3*  --   --   --   --   --   HGBA1C  --   --   --   --   --  8.6*  --   --   --   --   --   --   --   --   --   --   --   --   --   --   --   BNP 46.8  --   --   --   --   --   --   --   --   --   --   --   --   --   --   --   --   --   --   --   --    < > = values in this interval not displayed.    Lab Results  Component Value Date   HGBA1C 8.6 (H) 12/31/2022      Component Value Date/Time   BNP 46.8 12/27/2022 1618    CBG: Recent Labs  Lab 01/02/23 0309 01/02/23 0405 01/02/23 0500 01/02/23 0608 01/02/23 0655  GLUCAP 153* 135* 157* 156* 149*    Recent Results (from the past 240 hour(s))  SARS Coronavirus 2 by RT PCR (hospital order, performed in [facility] hospital lab) *cepheid single result test* Anterior Nasal Swab     Status: None   Collection Time: 12/27/22  4:18 PM   Specimen: Anterior Nasal Swab  Result Value Ref Range Status   SARS Coronavirus 2 by RT PCR NEGATIVE NEGATIVE Final    Comment: (NOTE) SARS-CoV-2 target nucleic acids are NOT DETECTED.  The SARS-CoV-2 RNA is generally detectable in upper and lower respiratory specimens during the acute phase of infection. The lowest concentration of SARS-CoV-2 viral copies this assay can detect is 250 copies / mL. A negative result does not preclude SARS-CoV-2 infection and should not be used as the sole basis for treatment or other patient management decisions.  A negative result may occur with improper specimen collection / handling, submission of specimen other than nasopharyngeal swab, presence of viral mutation(s) within the  areas targeted by this assay, and inadequate number of viral copies (<250 copies / mL). A negative result must be combined with clinical observations, patient history, and epidemiological information.  Fact Sheet for Patients:   RoadLapTop.co.za  Fact Sheet for Healthcare  Providers: http://kim-miller.com/  This test is not yet approved or  cleared by the [place] FDA and has been authorized for detection and/or diagnosis of SARS-CoV-2 by FDA under an Emergency Use Authorization (EUA).  This EUA will remain in effect (meaning this test can be used) for the duration of the COVID-19 declaration under Section 564(b)(1) of the Act, 21 U.S.C. section 360bbb-3(b)(1), unless the authorization is terminated or revoked sooner.  Performed at [facility], [address], [place], [place] [address]   Surgical pcr screen     Status: None   Collection Time: 12/31/22 11:22 PM   Specimen: Nasal Mucosa; Nasal Swab  Result Value Ref Range Status   MRSA, PCR NEGATIVE NEGATIVE Final   Staphylococcus aureus NEGATIVE NEGATIVE Final    Comment: (NOTE) The Xpert SA Assay (FDA approved for NASAL specimens in patients 67 years of age and older), is one component of a comprehensive surveillance program. It is not intended to diagnose infection nor to guide or monitor treatment. Performed at [facility], [address] [place], [place] [address]      Radiology Studies: DG Chest Port 1 View  Result Date: 01/01/2023 CLINICAL DATA:  Coronary bypass grafting EXAM: PORTABLE CHEST 1 VIEW COMPARISON:  12/31/2022 FINDINGS: Postsurgical changes are now seen. Endotracheal tube and gastric catheter are noted in satisfactory position. Left chest tube and pericardial drain are seen. Right jugular central line is noted. The lungs are hypoinflated but clear. No acute bony abnormality is seen. IMPRESSION: Postsurgical changes with tubes and lines as described. Electronically Signed   By: Alcide Clever M.D.   On: 01/01/2023 18:19   EP STUDY  Result Date: 01/01/2023 See surgical note for result.   Carma Leaven DO [facility]  Between 7 pm - 7 am I am not available, please contact night coverage MD/APP via Amion

## 2023-01-02 NOTE — Progress Notes (Signed)
Patient ID: [name], female   DOB: April 17, 1961, 62 y.o.   MRN: [id number] TCTS Evening Rounds:  Hemodynamically stable in sinus rhythm.  Sats 98% 3L  UO good.    BMET    Component Value Date/Time   NA 133 (L) 01/02/2023 1531   NA 138 11/26/2022 1619   K 3.6 01/02/2023 1531   CL 108 01/02/2023 1531   CO2 19 (L) 01/02/2023 1531   GLUCOSE 234 (H) 01/02/2023 1531   BUN 12 01/02/2023 1531   BUN 28 (H) 11/26/2022 1619   CREATININE 0.85 01/02/2023 1531   CREATININE 0.77 09/24/2012 0853   CALCIUM 7.7 (L) 01/02/2023 1531   EGFR 55 (L) 11/26/2022 1619   GFRNONAA >60 01/02/2023 1531   GFRNONAA >89 09/24/2012 0853   CBC    Component Value Date/Time   WBC 16.1 (H) 01/02/2023 1531   RBC 3.45 (L) 01/02/2023 1531   HGB 10.6 (L) 01/02/2023 1531   HGB 12.3 11/26/2022 1619   HCT 31.1 (L) 01/02/2023 1531   HCT 37.6 11/26/2022 1619   PLT 125 (L) 01/02/2023 1531   PLT 216 11/26/2022 1619   MCV 90.1 01/02/2023 1531   MCV 88 11/26/2022 1619   MCH 30.7 01/02/2023 1531   MCHC 34.1 01/02/2023 1531   RDW 14.2 01/02/2023 1531   RDW 14.4 11/26/2022 1619   LYMPHSABS 2.0 12/27/2022 1618   LYMPHSABS 3.3 (H) 11/26/2022 1619   MONOABS 0.6 12/27/2022 1618   EOSABS 0.1 12/27/2022 1618   EOSABS 0.1 11/26/2022 1619   BASOSABS 0.1 12/27/2022 1618   BASOSABS 0.1 11/26/2022 1619

## 2023-01-02 NOTE — Progress Notes (Signed)
NAME:  [name], MRN:  [id number], DOB:  20-Sep-1960, LOS: 6 ADMISSION DATE:  12/27/2022, CONSULTATION DATE: 01/01/2023 REFERRING MD: Dr. Cliffton Asters, CHIEF COMPLAINT: S/p CABG  History of Present Illness:  62 year old female with diabetes, hypertension and hyperlipidemia who initially presented with chest pain, was admitted with acute NSTEMI, underwent cardiac cath which showed multivessel coronary artery disease, today she underwent CABG x 3.  Remained intubated, PCCM was consulted for help evaluation medical management EBL 1600 Received 1 unit PRBC and 300 Cell Saver  Pertinent  Medical History   Past Medical History:  Diagnosis Date   Abdominal discomfort    Chronic abdominal and pelvic pain resulting in significant loss of time from work   Anxiety    with depression   Asthma    Chest pain    Depression    Diabetes mellitus    Drug overdose 06/12/2007   60 Naprosyn, psychiatric admission   Hyperlipidemia    Severe   Hypertension    Migraines    Tremor of both hands    on propranolol     Significant Hospital Events: Including procedures, antibiotic start and stop dates in addition to other pertinent events   7/23 CABG  7/24 extubated and awake, remains on dobutamine   Interim History / Subjective:  Extubated via rapid wean pathway Awake, feeling ok   Objective   Blood pressure (!) 127/57, pulse 87, temperature 99.9 F (37.7 C), resp. rate 15, height 5\' 4"  (1.626 m), weight 97.4 kg, SpO2 98%. CVP:  [3 mmHg-15 mmHg] 8 mmHg CO:  [2.3 L/min-8.2 L/min] 6.1 L/min CI:  [1.2 L/min/m2-4.2 L/min/m2] 3.1 L/min/m2  Vent Mode: SIMV;PRVC;PSV FiO2 (%):  [50 %] 50 % Set Rate:  [16 bmp-18 bmp] 18 bmp Vt Set:  [440 mL] 440 mL PEEP:  [5 cmH20] 5 cmH20 Pressure Support:  [10 cmH20] 10 cmH20 Plateau Pressure:  [16 cmH20] 16 cmH20   Intake/Output Summary (Last 24 hours) at 01/02/2023 0844 Last data filed at 01/02/2023 0800 Gross per 24 hour  Intake 5724.95 ml  Output  4630 ml  Net 1094.95 ml   Filed Weights   01/01/23 0411 01/01/23 1056 01/02/23 0500  Weight: 90.8 kg 90.8 kg 97.4 kg    General:  chronically and acutely ill-appearing F in no acute distress HEENT: MM pink/moist Neuro: alert and oriented  CV: s1s2 rrr, no m/r/g PULM:  chest and mediastinal tubes in place, clear bilaterally on 3L [place] GI: soft, non-tender  Extremities: warm/dry, 1+ edema  Skin: no rashes or lesions  Labs: K 3.3 Mag 2.2 WBC 16.2 Platelets 138  Resolved Hospital Problem list   Acute kidney injury  Assessment & Plan:   Coronary artery disease s/p CABG x 3 Progressing, extubated -management per TCTS -chest tubes in place -IS and ambulate as able -multimodal pain control -continue dobutamine today -Continue multi-modal pain control with tramadol, oxycodone and morphine -Closely monitor chest tube output -allergic to [place]  Acute respiratory insufficiency, postop Improved, extubated -[place] O2 to maintain sats >90% and IS  Hypertension -continue holding  antihypertensive for now  Hyperlipidemia -Continue atorvastatin  Diabetes type 2 Patient hemoglobin A1c is 8.6 -transition from insulin infusion to SSI as able,  goal 140-180  Expected perioperative blood loss anemia Thrombocytopenia due to CPB S/p 1 unit of PRBC -Hgb stable and platelets improved    Best Practice (right click and "Reselect all SmartList Selections" daily)   Diet/type: Regular consistency (see orders) DVT prophylaxis: SCD GI prophylaxis: PPI Lines: Central  line, Arterial Line, and yes and it is still needed Foley:  Yes, and it is still needed Code Status:  full code Last date of multidisciplinary goals of care discussion [Per primary team]  Labs   CBC: Recent Labs  Lab 12/27/22 1618 12/27/22 1635 12/31/22 0403 01/01/23 0005 01/01/23 1420 01/01/23 1532 01/01/23 1538 01/01/23 1729 01/01/23 1914 01/01/23 2200 01/02/23 0157 01/02/23 0334 01/02/23 0401  WBC 10.5   < >  8.1 9.5  --   --   --  13.0*  --  19.4*  --   --  16.2*  NEUTROABS 7.4  --   --   --   --   --   --   --   --   --   --   --   --   HGB 11.8*   < > 10.7* 10.8*   < > 6.8*   < > 10.7* 10.9* 11.7* 10.5* 10.5* 11.0*  HCT 36.4   < > 32.8* 31.5*   < > 19.9*   < > 31.2* 32.0* 34.2* 31.0* 31.0* 31.3*  MCV 93.1   < > 90.6 89.5  --   --   --  89.9  --  87.0  --   --  87.9  PLT 204   < > 189 192  --  119*  --  109*  --  156  --   --  138*   < > = values in this interval not displayed.    Basic Metabolic Panel: Recent Labs  Lab 12/29/22 0516 12/30/22 0407 12/31/22 0403 01/01/23 0005 01/01/23 1420 01/01/23 1429 01/01/23 1503 01/01/23 1538 01/01/23 1604 01/01/23 1627 01/01/23 1726 01/01/23 1914 01/01/23 2200 01/02/23 0157 01/02/23 0334 01/02/23 0401  NA 137 135 135 133*   < > 137   < > 138   < > 137   < > 143 137 143 144 139  K 4.1 3.7 3.8 3.7   < > 4.0   < > 4.1   < > 4.7   < > 3.9 4.5 3.6 3.4* 3.3*  CL 107 105 107 106   < > 108  --  107  --  109  --   --  110  --   --  109  CO2 22 18* 19* 19*  --   --   --   --   --   --   --   --  20*  --   --  22  GLUCOSE 213* 210* 291* 261*   < > 201*  --  117*  --  158*  --   --  190*  --   --  150*  BUN 12 15 20  26*   < > 22  --  18  --  18  --   --  16  --   --  14  CREATININE 0.88 0.95 1.14* 1.22*   < > 0.80  --  0.70  --  0.70  --   --  0.91  --   --  0.87  CALCIUM 9.0 9.0 9.0 9.3  --   --   --   --   --   --   --   --  7.7*  --   --  8.1*  MG 1.3* 1.9  --   --   --   --   --   --   --   --   --   --  2.9*  --   --  2.2   < > = values in this interval not displayed.   GFR: Estimated Creatinine Clearance: 76 mL/min (by C-G formula based on SCr of 0.87 mg/dL). Recent Labs  Lab 01/01/23 0005 01/01/23 1729 01/01/23 2200 01/02/23 0401  WBC 9.5 13.0* 19.4* 16.2*    Liver Function Tests: Recent Labs  Lab 12/29/22 0516  AST 19  ALT 23  ALKPHOS 41  BILITOT 0.4  PROT 5.8*  ALBUMIN 3.3*   No results for input(s): "LIPASE", "AMYLASE" in  the last 168 hours. No results for input(s): "AMMONIA" in the last 168 hours.  ABG    Component Value Date/Time   PHART 7.366 01/02/2023 0334   PCO2ART 34.0 01/02/2023 0334   PO2ART 87 01/02/2023 0334   HCO3 19.4 (L) 01/02/2023 0334   TCO2 20 (L) 01/02/2023 0334   ACIDBASEDEF 5.0 (H) 01/02/2023 0334   O2SAT 96 01/02/2023 0334     Coagulation Profile: Recent Labs  Lab 12/31/22 2034 01/01/23 1729  INR 1.0 1.3*    Cardiac Enzymes: No results for input(s): "CKTOTAL", "CKMB", "CKMBINDEX", "TROPONINI" in the last 168 hours.  HbA1C: HB A1C (BAYER DCA - WAIVED)  Date/Time Value Ref Range Status  11/26/2022 04:17 PM 9.1 (H) 4.8 - 5.6 % Final    Comment:             Prediabetes: 5.7 - 6.4          Diabetes: >6.4          Glycemic control for adults with diabetes: <7.0   08/21/2022 03:46 PM 9.2 (H) 4.8 - 5.6 % Final    Comment:             Prediabetes: 5.7 - 6.4          Diabetes: >6.4          Glycemic control for adults with diabetes: <7.0    Hgb A1c MFr Bld  Date/Time Value Ref Range Status  12/31/2022 04:11 AM 8.6 (H) 4.8 - 5.6 % Final    Comment:    (NOTE) Pre diabetes:          5.7%-6.4%  Diabetes:              >6.4%  Glycemic control for   <7.0% adults with diabetes     CBG: Recent Labs  Lab 01/02/23 0405 01/02/23 0500 01/02/23 0608 01/02/23 0655 01/02/23 0758  GLUCAP 135* 157* 156* 149* 145*    Review of Systems:   Unable to obtain as patient is intubated and sedated  Past Medical History:  She,  has a past medical history of Abdominal discomfort, Anxiety, Asthma, Chest pain, Depression, Diabetes mellitus, Drug overdose (06/12/2007), Hyperlipidemia, Hypertension, Migraines, and Tremor of both hands.   Surgical History:   Past Surgical History:  Procedure Laterality Date   ABDOMINAL HYSTERECTOMY  02/10/2007   + lysis of adhesions   APPENDECTOMY  1990s   ruptured, late 90s   BILATERAL OOPHORECTOMY      in 2 surgeries prior to 9/08   CARPAL  TUNNEL RELEASE Right 08/16/2021   Procedure: RIGHT CARPAL TUNNEL RELEASE;  Surgeon: Marlyne Beards, MD;  Location: [facility];  Service: Orthopedics;  Laterality: Right;   COLONOSCOPY  02/09/2010    normal upper endoscopy, single colonic polyp,   CORONARY ARTERY BYPASS GRAFT N/A 01/01/2023   Procedure: CORONARY ARTERY BYPASS GRAFTING (CABG) TIMES THREE USING THE LEFT INTERNAL MAMMARY ARTERY (LIMA) AND ENDOSCOPICALLY HARVESTED RIGHT GREATER  SAPHENOUS VEIN;  Surgeon: Corliss Skains, MD;  Location: [facility] OR;  Service: Open Heart Surgery;  Laterality: N/A;   HERNIA REPAIR     LEFT HEART CATH AND CORONARY ANGIOGRAPHY N/A 12/28/2022   Procedure: LEFT HEART CATH AND CORONARY ANGIOGRAPHY;  Surgeon: [name], MD;  Location: [facility];  Service: Cardiovascular;  Laterality: N/A;   TEE WITHOUT CARDIOVERSION N/A 01/01/2023   Procedure: TRANSESOPHAGEAL ECHOCARDIOGRAM;  Surgeon: Corliss Skains, MD;  Location: [facility] OR;  Service: Open Heart Surgery;  Laterality: N/A;   VENTRAL HERNIA REPAIR     x3     Social History:   reports that she has never smoked. She has never used smokeless tobacco. She reports that she does not drink alcohol and does not use drugs.   Family History:  Her family history is not on file.   Allergies Allergies  Allergen Reactions   Aspirin Anaphylaxis   Bee Venom Anaphylaxis   Benadryl [Diphenhydramine Hcl] Anaphylaxis   Morphine And Codeine Shortness Of Breath and Itching   Vicks Formula 44 Cough-Cold Pm [Dm-Apap-Cpm] Shortness Of Breath and Rash    Not anaphylaxis     Home Medications  Prior to Admission medications   Medication Sig Start Date End Date Taking? Authorizing Provider  albuterol (VENTOLIN HFA) 108 (90 Base) MCG/ACT inhaler Inhale 2 puffs into the lungs every 4 (four) hours as needed for wheezing or shortness of breath. (NEEDS TO BE SEEN) 03/30/20  Yes Dettinger, Elige Radon, MD  atorvastatin (LIPITOR) 40 MG tablet Take 40 mg  by mouth daily. 12/10/22  Yes [provider]  benazepril (LOTENSIN) 20 MG tablet Take 1 tablet (20 mg total) by mouth daily. 11/26/22  Yes Martin, Mary-Margaret, FNP  buPROPion (WELLBUTRIN XL) 150 MG 24 hr tablet TAKE 3 TABLETS BY MOUTH DAILY. 12/10/22  Yes Martin, Mary-Margaret, FNP  EPINEPHrine 0.3 mg/0.3 mL IJ SOAJ injection Inject 0.3 mg into the muscle as needed for anaphylaxis. 11/22/20  Yes Daphine Deutscher, Mary-Margaret, FNP  fenofibrate 160 MG tablet Take 1 tablet (160 mg total) by mouth daily. 11/26/22  Yes Martin, Mary-Margaret, FNP  glipiZIDE (GLUCOTROL XL) 10 MG 24 hr tablet Take 1 tablet (10 mg total) by mouth daily. 11/26/22  Yes Martin, Mary-Margaret, FNP  lisinopril (ZESTRIL) 10 MG tablet Take 1 tablet (10 mg total) by mouth daily. 11/26/22  Yes Daphine Deutscher, Mary-Margaret, FNP  metFORMIN (GLUCOPHAGE) 500 MG tablet Take 1 tablet (500 mg total) by mouth 2 (two) times daily with a meal. 11/26/22  Yes Daphine Deutscher, Mary-Margaret, FNP  propranolol (INDERAL) 40 MG tablet Take 1 tablet (40 mg total) by mouth 2 (two) times daily. (Needs to be seen before next refill) 11/26/22  Yes Daphine Deutscher, Mary-Margaret, FNP  rizatriptan (MAXALT) 10 MG tablet Take 1 tablet (10 mg total) by mouth as needed for migraine. May repeat in 2 hours if needed 07/23/22  Yes Daphine Deutscher, Mary-Margaret, FNP  RYBELSUS 7 MG TABS TAKE 1 TABLET (7 MG TOTAL) BY MOUTH DAILY 08/23/22  Yes Daphine Deutscher, Mary-Margaret, FNP  topiramate (TOPAMAX) 50 MG tablet Take 1 tablet (50 mg total) by mouth 2 (two) times daily. 11/26/22  Yes Daphine Deutscher, Mary-Margaret, FNP     Critical care time: 32 minutes    CRITICAL CARE Performed by: Darcella Gasman Mikesha Migliaccio   Total critical care time: 32 minutes  Critical care time was exclusive of separately billable procedures and treating other patients.  Critical care was necessary to treat or prevent imminent or life-threatening deterioration.  Critical care was time  spent personally by me on the following activities: development of  treatment plan with patient and/or surrogate as well as nursing, discussions with consultants, evaluation of patient's response to treatment, examination of patient, obtaining history from patient or surrogate, ordering and performing treatments and interventions, ordering and review of laboratory studies, ordering and review of radiographic studies, pulse oximetry and re-evaluation of patient's condition.   Darcella Gasman Mubashir Mallek, PA-C [facility] See Amion for pager If no response to pager , please call [phone] until 7pm After 7:00 pm call Elink  [phone]

## 2023-01-02 NOTE — Discharge Summary (Signed)
Physician Discharge Summary  Patient ID: [name] MRN: [id number] DOB/AGE: January 17, 1961 62 y.o.  Admit date: 12/27/2022 Discharge date: 01/11/2023  Admission Diagnoses: Principal Problem:   Chest pain Active Problems:   Type 2 diabetes mellitus (HCC)   Hyperlipidemia associated with type 2 diabetes mellitus (HCC)   Hypotension   AKI (acute kidney injury) (HCC)   Non-ST elevation (NSTEMI) myocardial infarction (HCC)   Coronary artery disease due to lipid rich plaque    Discharge Diagnoses:  Principal Problem:   Chest pain Active Problems:   Type 2 diabetes mellitus (HCC)   Hyperlipidemia associated with type 2 diabetes mellitus (HCC)   Hypotension   AKI (acute kidney injury) (HCC)   Non-ST elevation (NSTEMI) myocardial infarction (HCC)   Coronary artery disease due to lipid rich plaque   Pure hypercholesterolemia   S/P CABG x 3   Expected acute blood loss anemia   Sinus pause  Right carotid stenosis  Discharged Condition: good  Referring: Sharrell Ku, MD Primary Care Provider: Bennie Pierini, FNP  History of Present Illness:     Mary. [name] is a 62 year old female with a past medical history notable for hypertension, type 2 diabetes mellitus, dyslipidemia, and aspirin allergy.  She is a lifelong non-smoker but has a strong family history of coronary artery disease.  She presented to a [facility] emergency room on 12/23/2022 after experiencing a bout of chest pain and was told it was not related to her heart.  On 12/27/22, she had a more severe episode of chest pain associated with nausea and diaphoresis while she was dining at [employer] with friends.  EMS was called and she was transported to [facility] ED where she was found to to be hypotensive with a blood pressure of 60/30.  Initial high-sensitivity troponin was 38 and repeat assay was 34.  EKG showed sinus rhythm with no acute ischemic changes.  A CT scan of the chest showed significant  calcification of the aortic valve and three-vessel coronary calcification as well. BP was stabilized with fluid resuscitation and she was admitted to the hospital with diagnosis of acute non-ST elevation myocardial infarction.  She was started on heparin and nitroglycerine infusions. She had an echocardiogram showing normal biventricular function with trivial MR and aortic valve sclerosis without significant stenosis.  Left heart catheterization was performed on 12/28/2022 revealing three-vessel coronary artery disease.  There is a 99% mid LAD stenosis.  The first diagonal has a long 70% proximal stenosis.  The circumflex has a proximal 75% stenosis before the first obtuse marginal.  The RCA is totally occluded near its midpoint.  There are right to right and left to right collaterals.   CT surgery has been asked to evaluate [name] for consideration of coronary bypass grafting. [name] reports she has been having chest pain off and on throughout the spring and summer.  She noticed this discomfort while walking her dogs on several occasions.  She had a particularly severe episode of chest pain July 4 holiday when she was moving several boxes into a new residence. She has had a few episodes of chest pain since admission.     [name] was recently widowed when she lost her husband during the COVID epidemic. She does not have any children and does not have any family in the local area.  She has lost 40lbs in the past year by walking, swimming and being more careful with her diet. She had been walking her dog about  1 mile three times daily before developing exertional angina. She is left handed.   [name] has multivessel coronary artery disease presenting with unstable angina pectoris, mild elevation in HS troponin, and preserved biventricular function.  Currently pain-free on heparin and nitroglycerin infusions.  We agree coronary bypass grafting is her best option for revascularization.   The procedure and expected perioperative course are discussed with [name] and her questions were answered.  She would like to proceed with surgery as soon as it can be scheduled.    Hospital Course:  [name] remained stable on heparin and NTG. She was taken to the OR on 01/01/23 where CABG x 3 was accomplished. Following the procedure, she was transferred to the surgical ICU in stable condition. Vital signs and hemodynamics were stable initially but she had a 6 sec sinus pause overnight after surgery. She was started on IV dobutamine. Beta blocking agents were held. She was weaned from the ventilator and extubated by 5am on post-op day 1 and mobilized later that day.  She had recovery of stable sinus rhythm. The Dobutamine was weaned off on post-op day 2 and metoprolol was added. Diuresis was initiated for expected post-operative volume excess.  She has diuresed well and it has subsequently been stopped. She was ready for transfer to 4E Progressive Care on post-op day 2 but remained in the ICU until a bed became available on postop day 3. She continued to progress with mobility. Supplemental oxygen was weaned off.  Diet was advanced routinely and well-tolerated. Most home diabetic medications were resumed, full regimen would be resumed at discharge. Her preoperative Hgb A1C was 8.6, she would require close follow up with her PCP. She had return appropriate bowel and bladder function.  Blood pressure was elevated and Cozaar was added. She has had some sinus pauses and beta-blocker has been discontinued. She had a burst of atrial fibrillation with RVR but quickly converted back to NSR without intervention, this was monitored closely and she was restarted on low dose Lopressor as discussed with Dr. Cliffton Asters. She lives alone without help, this was discussed with the case manager for possible short term SNF options but would not be approved for SNF due to her progression. She continued to have bursts of  SVT possible atrial flutter with rates into the 150s, PO Amiodarone was started. She then had severe bradycardia with rate in the 20s, possible Mobitz type 2 heart block. EP was consulted and she was transferred back to the ICU for closer monitoring. EP recommended holding Amiodarone and all AV nodal blocking agents, she continued to have bursts of atrial flutter and SVT and would be placed on a ZIO monitor at discharge.  A concern for sleep apnea for which she was being worked up for but has not had a formal sleep study yet history is in regard to contributing to some of these SVT episodes.  Auto titration CPAP has been ordered by CCM but unfortunately she could not tolerate due to claustrophobia symptoms.  She does intend to have a sleep study and is hopeful for another type of device would be tolerated in the future.  Overall, at the time of discharge the patient is felt to be stable.  Consults: pulmonary/intensive care  Significant Diagnostic Studies: Diagnostic Dominance: Right Left Anterior Descending  Mid LAD lesion is 99% stenosed.    First Diagonal Branch  1st Diag lesion is 70% stenosed.    Ramus Intermedius  Vessel is small. There is moderate diffuse  disease throughout the vessel.    Left Circumflex  Prox Cx lesion is 75% stenosed.    Right Coronary Artery  Mid RCA lesion is 100% stenosed.    Right Posterior Descending Artery  Collaterals  RPDA filled by collaterals from RV Branch.      Third Right Posterolateral Branch  Collaterals  3rd RPL filled by collaterals from 3rd Mrg.    Collaterals  3rd RPL filled by collaterals from 3rd Mrg      Treatments: Surgery 01/01/2023 Patient:  [name] Pre-Op Dx: CAD DM HTN Obesity   Post-op Dx:  same Procedure: CABG X 3.  LIMA LAD, RSVG PDA, OM   Endoscopic greater saphenous vein harvest on the right     Surgeon and Role:      * Lightfoot, Eliezer Lofts, MD - Primary    * M. Roddenberry , PA-C -  assisting Anesthesia  general EBL:  Blood Administration: 1unit pRBCs Xclamp Time:  47 min Pump Time:    Drains: 19 F blake drain:  L, mediastinal  Wires: none Counts: correct     Indications: Mary. Zailey Yearout. Vanhoozer is a 62 year old female with a past medical history notable for hypertension, type 2 diabetes mellitus, dyslipidemia, and aspirin allergy.  She is a lifelong non-smoker but has a strong family history of coronary artery disease.  She presented to a [facility] emergency room on 12/23/2022 after experiencing a bout of chest pain and was told it was not related to her heart.  On 12/27/22, she had a more severe episode of chest pain associated with nausea and diaphoresis while she was dining at [employer] with friends.  EMS was called and she was transported to [facility] ED where she was found to to be hypotensive with a blood pressure of 60/30.  Initial high-sensitivity troponin was 38 and repeat assay was 34.  EKG showed sinus rhythm with no acute ischemic changes.  A CT scan of the chest showed significant calcification of the aortic valve and three-vessel coronary calcification as well. BP was stabilized with fluid resuscitation and she was admitted to the hospital with diagnosis of acute non-ST elevation myocardial infarction.  She was started on heparin and nitroglycerine infusions. She had an echocardiogram showing normal biventricular function with trivial MR and aortic valve sclerosis without significant stenosis.  Left heart catheterization was performed on 12/28/2022 revealing three-vessel coronary artery disease.  There is a 99% mid LAD stenosis.  The first diagonal has a long 70% proximal stenosis.  The circumflex has a proximal 75% stenosis before the first obtuse marginal.  The RCA is totally occluded near its midpoint.  There are right to right and left to right collaterals.   CT surgery has been asked to evaluate Mr. Wilds for consideration of coronary  bypass grafting.   Findings: Good LIMA, and vein.  Good LAD, and OM.  Small PDA   ECHOCARDIOGRAM REPORT    Patient Name:   LUNDEN LINGLE Date of Exam: 12/29/2022 Medical Rec #:  [id number]            Height:       64.0 in Accession #:    [id number]           Weight:       202.3 lb Date of Birth:  12-17-1960            BSA:          1.966 m Patient Age:  62 years             BP:           117/66 mmHg Patient Gender: F                    HR:           70 bpm. Exam Location:  Inpatient  Procedure: 2D Echo, Cardiac Doppler and Color Doppler  Indications:    Chest Pain R07.9   History:        Patient has prior history of Echocardiogram examinations. CAD;                 Risk Factors:Hypertension, Diabetes, Dyslipidemia and                 Non-Smoker.   Sonographer:    Dondra Prader RVT RCS Referring Phys: [id number] [facility] CHANDRASEKHAR  IMPRESSIONS    1. Left ventricular ejection fraction, by estimation, is 60 to 65%. The left ventricle has normal function. The left ventricle has no regional wall motion abnormalities. Left ventricular diastolic parameters were normal.  2. Right ventricular systolic function is normal. The right ventricular size is normal.  3. Left atrial size was mildly dilated.  4. Trivial mitral valve regurgitation.  5. The aortic valve is tricuspid. Aortic valve regurgitation is not visualized. Aortic valve sclerosis is present, with no evidence of aortic valve stenosis.  6. The inferior vena cava is normal in size with greater than 50% respiratory variability, suggesting right atrial pressure of 3 mmHg.  FINDINGS  Left Ventricle: Left ventricular ejection fraction, by estimation, is 60 to 65%. The left ventricle has normal function. The left ventricle has no regional wall motion abnormalities. The left ventricular internal cavity size was normal in size. There is  no left ventricular hypertrophy. Left ventricular diastolic parameters were  normal.  Right Ventricle: The right ventricular size is normal. Right vetricular wall thickness was not assessed. Right ventricular systolic function is normal.  Left Atrium: Left atrial size was mildly dilated.  Right Atrium: Right atrial size was normal in size.  Pericardium: There is no evidence of pericardial effusion.  Mitral Valve: Mild mitral annular calcification. Trivial mitral valve regurgitation.  Tricuspid Valve: The tricuspid valve is normal in structure. Tricuspid valve regurgitation is trivial.  Aortic Valve: The aortic valve is tricuspid. Aortic valve regurgitation is not visualized. Aortic valve sclerosis is present, with no evidence of aortic valve stenosis. Aortic valve mean gradient measures 10.5 mmHg. Aortic valve peak gradient measures 19.9 mmHg. Aortic valve area, by VTI measures 2.10 cm.  Pulmonic Valve: The pulmonic valve was not well visualized. Pulmonic valve regurgitation is not visualized.  Aorta: The aortic root and ascending aorta are structurally normal, with no evidence of dilitation.  Venous: The inferior vena cava is normal in size with greater than 50% respiratory variability, suggesting right atrial pressure of 3 mmHg.  IAS/Shunts: No atrial level shunt detected by color flow Doppler.    LEFT VENTRICLE PLAX 2D LVIDd:         4.70 cm   Diastology LVIDs:         3.00 cm   LV e' medial:    7.72 cm/s LV PW:         1.00 cm   LV E/e' medial:  14.4 LV IVS:        0.90 cm   LV e' lateral:   10.80 cm/s LVOT diam:     2.00  cm   LV E/e' lateral: 10.3 LV SV:         94 LV SV Index:   48 LVOT Area:     3.14 cm    RIGHT VENTRICLE             IVC RV Basal diam:  2.65 cm     IVC diam: 1.90 cm RV Mid diam:    2.40 cm RV S prime:     15.90 cm/s TAPSE (M-mode): 2.2 cm  LEFT ATRIUM             Index        RIGHT ATRIUM           Index LA diam:        3.70 cm 1.88 cm/m   RA Area:     10.20 cm LA Vol (A2C):   62.2 ml 31.64 ml/m  RA Volume:    21.50 ml  10.94 ml/m LA Vol (A4C):   67.6 ml 34.39 ml/m LA Biplane Vol: 65.8 ml 33.47 ml/m  AORTIC VALVE                     PULMONIC VALVE AV Area (Vmax):    2.06 cm      PV Vmax:       1.21 m/s AV Area (Vmean):   2.09 cm      PV Peak grad:  5.9 mmHg AV Area (VTI):     2.10 cm AV Vmax:           223.00 cm/s AV Vmean:          151.500 cm/s AV VTI:            0.448 m AV Peak Grad:      19.9 mmHg AV Mean Grad:      10.5 mmHg LVOT Vmax:         146.00 cm/s LVOT Vmean:        101.000 cm/s LVOT VTI:          0.300 m LVOT/AV VTI ratio: 0.67   AORTA Ao Root diam: 2.70 cm Ao Asc diam:  3.40 cm  MITRAL VALVE MV Area (PHT): 3.08 cm     SHUNTS MV Decel Time: 246 msec     Systemic VTI:  0.30 m MV E velocity: 111.00 cm/s  Systemic Diam: 2.00 cm MV A velocity: 112.00 cm/s MV E/A ratio:  0.99  Dietrich Pates MD Electronically signed by Dietrich Pates MD Signature Date/Time: 12/29/2022/11:50:02 AM     Final        DG Chest 1 View  Result Date: 01/10/2023 CLINICAL DATA:  Status post CABG EXAM: CHEST  1 VIEW COMPARISON:  01/03/2023 FINDINGS: Interval removal of a right neck vascular catheter. Status post median sternotomy and CABG. Low volume AP portable examination with minimal atelectasis of the mid lungs. IMPRESSION: 1. Interval removal of a right neck vascular catheter. 2. Low volume AP portable examination with minimal atelectasis of the mid lungs. Electronically Signed   By: Jearld Lesch M.D.   On: 01/10/2023 12:20    Discharge Exam: Blood pressure 111/76, pulse 81, temperature 98.1 F (36.7 C), temperature source Oral, resp. rate 16, height 5\' 4"  (1.626 m), weight 90 kg, SpO2 97%.   General appearance: alert, cooperative, and no distress Heart: regular rate and rhythm Lungs: clear to auscultation bilaterally Abdomen: benign Extremities: no edema Wound: incis healing well    Disposition: Discharge disposition: 01-Home or Self Care  Discharge Instructions     AMB  Referral to Advanced Lipid Disorders Clinic   Complete by: As directed    Reason for referral: Patients refractory to standard guideline based therapy Comment - On atorvastatin 40mg  w/ LDL 136   Internal Lipid Clinic Referral Scheduling  Internal lipid clinic referrals are providers within [facility] who wish to refer established patients for routine management (help in starting PCSK9 inhibitor therapy) or advanced therapies.  Internal MD referral criteria:              1. All patients with LDL>190 mg/dL  2. All patients with Triglycerides >500 mg/dL  3. Patients with suspected or confirmed heterozygous familial hyperlipidemia (HeFH) or homozygous familial hyperlipidemia (HoFH)  4. Patients with family history of suspicious for genetic dyslipidemia desiring genetic testing  5. Patients refractory to standard guideline based therapy  6. Patients with statin intolerance (failed 2 statins, one of which must be a high potency statin)  7. Patients who the provider desires to be seen by MD   Internal PharmD referral criteria:   1. Follow-up patients for medication management  2. Follow-up for compliance monitoring  3. Patients for drug education  4. Patients with statin intolerance  5. PCSK9 inhibitor education and prior authorization approvals  6. Patients with triglycerides <500 mg/dL  External Lipid Clinic Referral  External lipid clinic referrals are for providers outside of [facility], considered new clinic patients - automatically routed to MD schedule   Amb Referral to Cardiac Rehabilitation   Complete by: As directed    Diagnosis: CABG   CABG X ___: 3   After initial evaluation and assessments completed: Virtual Based Care may be provided alone or in conjunction with Phase 2 Cardiac Rehab based on patient barriers.: Yes   Intensive Cardiac Rehabilitation (ICR) [facility] location only OR Traditional Cardiac Rehabilitation (TCR) *If criteria for ICR are not met will enroll in TCR [facility]  only): Yes   Discharge patient   Complete by: As directed    Discharge today after the Zio Live patch has been placed   Discharge disposition: 01-Home or Self Care   Discharge patient date: 01/11/2023      Allergies as of 01/11/2023       Reactions   Aspirin Anaphylaxis   Bee Venom Anaphylaxis   Benadryl [diphenhydramine Hcl] Anaphylaxis   Morphine And Codeine Shortness Of Breath, Itching   Vicks Formula 44 Cough-cold Pm [dm-apap-cpm] Shortness Of Breath, Rash   Not anaphylaxis        Medication List     STOP taking these medications    benazepril 20 MG tablet Commonly known as: LOTENSIN   lisinopril 10 MG tablet Commonly known as: ZESTRIL   propranolol 40 MG tablet Commonly known as: INDERAL       TAKE these medications    albuterol 108 (90 Base) MCG/ACT inhaler Commonly known as: VENTOLIN HFA Inhale 2 puffs into the lungs every 4 (four) hours as needed for wheezing or shortness of breath. (NEEDS TO BE SEEN)   atorvastatin 80 MG tablet Commonly known as: LIPITOR Take 1 tablet (80 mg total) by mouth daily. What changed:  medication strength how much to take   buPROPion 150 MG 24 hr tablet Commonly known as: WELLBUTRIN XL TAKE 3 TABLETS BY MOUTH DAILY.   clopidogrel 75 MG tablet Commonly known as: PLAVIX Take 1 tablet (75 mg total) by mouth daily.   EPINEPHrine 0.3 mg/0.3 mL Soaj injection Commonly known as: EPI-PEN Inject 0.3 mg into  the muscle as needed for anaphylaxis.   fenofibrate 160 MG tablet Take 1 tablet (160 mg total) by mouth daily.   glipiZIDE 10 MG 24 hr tablet Commonly known as: GLUCOTROL XL Take 1 tablet (10 mg total) by mouth daily.   losartan 25 MG tablet Commonly known as: COZAAR Take 1 tablet (25 mg total) by mouth daily.   magnesium oxide 400 (240 Mg) MG tablet Commonly known as: MAG-OX Take 1 tablet (400 mg total) by mouth 2 (two) times daily for 30 days, then as directed by provider.   metFORMIN 500 MG tablet Commonly  known as: GLUCOPHAGE Take 1 tablet (500 mg total) by mouth 2 (two) times daily with a meal.   rizatriptan 10 MG tablet Commonly known as: Maxalt Take 1 tablet (10 mg total) by mouth as needed for migraine. May repeat in 2 hours if needed   Rybelsus 7 MG Tabs Generic drug: Semaglutide TAKE 1 TABLET (7 MG TOTAL) BY MOUTH DAILY   topiramate 50 MG tablet Commonly known as: TOPAMAX Take 1 tablet (50 mg total) by mouth 2 (two) times daily.   traMADol 50 MG tablet Commonly known as: ULTRAM Take 1 tablet (50 mg total) by mouth every 6 (six) hours as needed for moderate pain.               Durable Medical Equipment  (From admission, onward)           Start     Ordered   01/07/23 0830  For home use only DME 3 n 1  Once       Comments: Deconditioning, s/p cabg x 3   01/07/23 0829   01/07/23 0829  For home use only DME 4 wheeled rolling walker with seat  Once       Question Answer Comment  Patient needs a walker to treat with the following condition Physical deconditioning   Patient needs a walker to treat with the following condition S/P CABG x 3      01/07/23 0829   01/05/23 1410  For home use only DME 4 wheeled rolling walker with seat  Once       Question:  Patient needs a walker to treat with the following condition  Answer:  Weakness   01/05/23 1410            Follow-up Information     Corliss Skains, MD. Call on 01/18/2023.   Specialty: Cardiothoracic Surgery Why: Do not go the the office.  Dr. Cliffton Asters will  call you at around 3:10pm. Contact information: [address] [place] [address] [phone]         Christell Constant, MD. Go on 01/24/2023.   Specialty: Cardiology Why: Your appointment is at 8:20am. Contact information: [address] [place] [address] [phone]         Bennie Pierini, FNP. Schedule an appointment as soon as possible for a visit.   Specialty: Family Medicine Why: For  diabetes surveillance. Preop A1C 8.6 Contact information: [address] [place] [address] [phone]         [facility] Follow up.   Why: Rollator and bedside commode arranged- to deliver to room                The patient has been discharged on:   1.Beta Blocker:  Yes [   ]  No   [ X  ]                              If No, reason: Sinus pauses/bradycardia  2.Ace Inhibitor/ARB: Yes [ X  ]                                     No  [    ]                                     If No, reason:  3.Statin:   Yes [ X ]                  No  [   ]                  If No, reason:  4.Ecasa:  Yes  [   ]                  No   [ X ]                  If No, reason:  Allergy  5. ACS on Admission?  Yes  P2Y12 Inhibitor:  Yes  [ X ]                                No  [  ]    Signed: Rowe Clack, PA-C 01/11/2023, 9:41 AM

## 2023-01-02 NOTE — Procedures (Signed)
Extubation Procedure Note  Patient Details:   Name: [name] DOB: [birth year] MRN: [id number]   Airway Documentation:    Vent end date: 01/02/23 Vent end time: 0219   Evaluation  O2 sats: stable throughout Complications: No apparent complications Patient did tolerate procedure well. Bilateral Breath Sounds: Clear, Diminished   Yes  PT extubated per protocol. NIF and VC: 0.3/ -30.  Pt alert and oriented, cough leak heard.  Abg within normal limit.  Pt currently on 3l [place] with no respiratory distress noted. Will continue to monitor.      Jordan S Dutey 01/02/2023, 3:13 AM

## 2023-01-02 NOTE — Discharge Instructions (Signed)
Discharge Instructions:  1. You may shower, please wash incisions daily with soap and water and keep dry.  If you wish to cover wounds with dressing you may do so but please keep clean and change daily.  No tub baths or swimming until incisions have completely healed.  If your incisions become red or develop any drainage please call our office at [phone]  2. No Driving until cleared by Dr. Lightfoot's office and you are no longer using narcotic pain medications  3. Monitor your weight daily.. Please use the same scale and weigh at same time... If you gain 5-10 lbs in 48 hours with associated lower extremity swelling, please contact our office at [phone]  4. Fever of 101.5 for at least 24 hours with no source, please contact our office at [phone]  5. Activity- up as tolerated, please walk at least 3 times per day.  Avoid strenuous activity, no lifting, pushing, or pulling with your arms over 8-10 lbs for a minimum of 6 weeks  6. If any questions or concerns arise, please do not hesitate to contact our office at [phone] 

## 2023-01-02 NOTE — Progress Notes (Signed)
      [address]       [place] [address]             [phone]                 1 Day Post-Op Procedure(s) (LRB): CORONARY ARTERY BYPASS GRAFTING (CABG) TIMES THREE USING THE LEFT INTERNAL MAMMARY ARTERY (LIMA) AND ENDOSCOPICALLY HARVESTED RIGHT GREATER SAPHENOUS VEIN (N/A) TRANSESOPHAGEAL ECHOCARDIOGRAM (N/A)   Events: Sinus pauses overnight _______________________________________________________________ Vitals: BP (!) 104/41   Pulse 86   Temp 99.9 F (37.7 C)   Resp 11   Ht 5\' 4"  (1.626 m)   Wt 97.4 kg   SpO2 97%   BMI 36.86 kg/m  Filed Weights   01/01/23 0411 01/01/23 1056 01/02/23 0500  Weight: 90.8 kg 90.8 kg 97.4 kg     - Neuro: alert NAD  - Cardiovascular: sinus  Drips: levo 1, dob 2.5.   CVP:  [3 mmHg-15 mmHg] 8 mmHg CO:  [2.3 L/min-8.2 L/min] 5.7 L/min CI:  [1.2 L/min/m2-4.2 L/min/m2] 2.9 L/min/m2  - Pulm: EWOB  ABG    Component Value Date/Time   PHART 7.366 01/02/2023 0334   PCO2ART 34.0 01/02/2023 0334   PO2ART 87 01/02/2023 0334   HCO3 19.4 (L) 01/02/2023 0334   TCO2 20 (L) 01/02/2023 0334   ACIDBASEDEF 5.0 (H) 01/02/2023 0334   O2SAT 96 01/02/2023 0334    - Abd: ND - Extremity: warm  .Intake/Output      07/23 0701 07/24 0700 07/24 0701 07/25 0700   I.V. (mL/kg) 4077.4 (41.9)    Blood 370    IV Piggyback 1195.7    Total Intake(mL/kg) 5643.1 (57.9)    Urine (mL/kg/hr) 2710 (1.2)    Drains 0    Blood 1640    Chest Tube 230    Total Output 4580    Net +1063.1            _______________________________________________________________ Labs:    Latest Ref Rng & Units 01/02/2023    4:01 AM 01/02/2023    3:34 AM 01/02/2023    1:57 AM  CBC  WBC 4.0 - 10.5 K/uL 16.2     Hemoglobin 12.0 - 15.0 g/dL 69.6  29.5  28.4   Hematocrit 36.0 - 46.0 % 31.3  31.0  31.0   Platelets 150 - 400 K/uL 138         Latest Ref Rng & Units 01/02/2023    4:01 AM 01/02/2023    3:34 AM 01/02/2023    1:57 AM  CMP  Glucose 70 - 99  mg/dL 132     BUN 8 - 23 mg/dL 14     Creatinine 4.40 - 1.00 mg/dL 1.02     Sodium 725 - 366 mmol/L 139  144  143   Potassium 3.5 - 5.1 mmol/L 3.3  3.4  3.6   Chloride 98 - 111 mmol/L 109     CO2 22 - 32 mmol/L 22     Calcium 8.9 - 10.3 mg/dL 8.1       CXR: PV congestion  _______________________________________________________________  Assessment and Plan: POD 1 s/p CABG  Neuro: pain controlled CV: will keep dob today.  Will keep pads.  Holding BB,  Pulm: IS, ambulation Renal: creat stable.  Repleting K GI: on diet Heme: stable ID: afebrile Endo: SSI Dispo: continue ICU care   Corliss Skains 01/02/2023 7:51 AM

## 2023-01-03 ENCOUNTER — Inpatient Hospital Stay (HOSPITAL_COMMUNITY): Payer: 59

## 2023-01-03 DIAGNOSIS — Z951 Presence of aortocoronary bypass graft: Secondary | ICD-10-CM | POA: Diagnosis not present

## 2023-01-03 DIAGNOSIS — I2583 Coronary atherosclerosis due to lipid rich plaque: Secondary | ICD-10-CM | POA: Diagnosis not present

## 2023-01-03 DIAGNOSIS — E7800 Pure hypercholesterolemia, unspecified: Secondary | ICD-10-CM | POA: Diagnosis not present

## 2023-01-03 DIAGNOSIS — E1142 Type 2 diabetes mellitus with diabetic polyneuropathy: Secondary | ICD-10-CM | POA: Diagnosis not present

## 2023-01-03 DIAGNOSIS — I2510 Atherosclerotic heart disease of native coronary artery without angina pectoris: Secondary | ICD-10-CM | POA: Diagnosis not present

## 2023-01-03 DIAGNOSIS — I639 Cerebral infarction, unspecified: Secondary | ICD-10-CM

## 2023-01-03 DIAGNOSIS — I214 Non-ST elevation (NSTEMI) myocardial infarction: Secondary | ICD-10-CM | POA: Diagnosis not present

## 2023-01-03 LAB — GLUCOSE, CAPILLARY
Glucose-Capillary: 192 mg/dL — ABNORMAL HIGH (ref 70–99)
Glucose-Capillary: 229 mg/dL — ABNORMAL HIGH (ref 70–99)
Glucose-Capillary: 195 mg/dL — ABNORMAL HIGH (ref 70–99)
Glucose-Capillary: 204 mg/dL — ABNORMAL HIGH (ref 70–99)
Glucose-Capillary: 214 mg/dL — ABNORMAL HIGH (ref 70–99)

## 2023-01-03 LAB — CBC
MCH: 29.9 pg (ref 26.0–34.0)
MCHC: 32.9 g/dL (ref 30.0–36.0)
MCV: 91 fL (ref 80.0–100.0)
Platelets: 111 10*3/uL — ABNORMAL LOW (ref 150–400)
RBC: 3.21 MIL/uL — ABNORMAL LOW (ref 3.87–5.11)
RDW: 14.4 % (ref 11.5–15.5)
nRBC: 0 % (ref 0.0–0.2)

## 2023-01-03 LAB — BASIC METABOLIC PANEL
Anion gap: 7 (ref 5–15)
BUN: 9 mg/dL (ref 8–23)
CO2: 22 mmol/L (ref 22–32)
Calcium: 8.3 mg/dL — ABNORMAL LOW (ref 8.9–10.3)
Chloride: 107 mmol/L (ref 98–111)
Creatinine, Ser: 0.83 mg/dL (ref 0.44–1.00)
GFR, Estimated: >60 mL/min (ref >60)
Glucose, Bld: 196 mg/dL — ABNORMAL HIGH (ref 70–99)
Potassium: 4.1 mmol/L (ref 3.5–5.1)
Sodium: 136 mmol/L (ref 135–145)

## 2023-01-03 LAB — ECHO INTRAOPERATIVE TEE
AR max vel: 1.61 cm2
AV Area VTI: 1.66 cm2
AV Area mean vel: 1.45 cm2
AV Mean grad: 12 mmHg
AV Peak grad: 18.8 mmHg
Height: 64 in
S' Lateral: 2.36 cm
Weight: 3201.6 oz

## 2023-01-03 MED ORDER — TRAMADOL HCL 50 MG PO TABS
50.0000 mg | ORAL_TABLET | ORAL | Status: DC | PRN
  Administered 2023-01-03 – 2023-01-09 (×11): 100 mg via ORAL
  Administered 2023-01-10: 50 mg via ORAL
  Administered 2023-01-10 – 2023-01-11 (×3): 100 mg via ORAL
  Filled 2023-01-03 (×11): qty 2
  Filled 2023-01-03: qty 1
  Filled 2023-01-03 (×3): qty 2

## 2023-01-03 MED ORDER — SODIUM CHLORIDE 0.9 % IV SOLN: 250.0000 mL | INTRAVENOUS | Status: DC | PRN

## 2023-01-03 MED ORDER — INSULIN GLARGINE-YFGN 100 UNIT/ML ~~LOC~~ SOLN
10.0000 [IU] | Freq: Every day | SUBCUTANEOUS | Status: DC
  Administered 2023-01-03 – 2023-01-10 (×8): 10 [IU] via SUBCUTANEOUS
  Filled 2023-01-03 (×10): qty 0.1

## 2023-01-03 MED ORDER — SODIUM CHLORIDE 0.9% FLUSH
3.0000 mL | Freq: Two times a day (BID) | INTRAVENOUS | Status: DC
  Administered 2023-01-03 – 2023-01-04 (×3): 3 mL via INTRAVENOUS

## 2023-01-03 MED ORDER — ~~LOC~~ CARDIAC SURGERY, PATIENT & FAMILY EDUCATION: Freq: Once | Status: AC

## 2023-01-03 MED ORDER — IOHEXOL 350 MG/ML SOLN
75.0000 mL | Freq: Once | INTRAVENOUS | Status: AC | PRN
  Administered 2023-01-03: 75 mL via INTRAVENOUS

## 2023-01-03 MED ORDER — METOPROLOL TARTRATE 12.5 MG HALF TABLET
12.5000 mg | ORAL_TABLET | Freq: Two times a day (BID) | ORAL | Status: DC
  Administered 2023-01-03 – 2023-01-04 (×4): 12.5 mg via ORAL
  Filled 2023-01-03 (×4): qty 1

## 2023-01-03 MED ORDER — POLYETHYLENE GLYCOL 3350 17 G PO PACK
17.0000 g | PACK | Freq: Two times a day (BID) | ORAL | Status: DC
  Administered 2023-01-03 – 2023-01-08 (×2): 17 g via ORAL
  Filled 2023-01-03 (×8): qty 1

## 2023-01-03 MED ORDER — SODIUM CHLORIDE 0.9% FLUSH: 3.0000 mL | INTRAVENOUS | Status: DC | PRN

## 2023-01-03 NOTE — Consult Note (Signed)
Neurology Consultation  Reason for Consult: Stroke on MRI Referring Physician: Dr. Natale Milch  CC: Strokes and MRI  History is obtained from: Chart, patient  HPI: [name] is a 62 y.o. female past medical history of diabetes, hypertension, hyperlipidemia, depression anxiety came in for chest pain and near syncopal episode with complaints of off-and-on intermittent chest pains with pressure and discomfort in the chest worsening with exertion.  Unstable angina with severe multivessel coronary artery disease seen on cardiac catheterization performed on 12/28/22.  Deemed a candidate for CABG MRI brain was obtained because of right-sided arm pain and numbness transient episode few days after the cardiac catheterization.  Also had worsening of cardiac symptoms warranting emergent CABG on 01/01/2023.  In the interim, MRI had been obtained on 12/31/2022 due to complaints of transient right-sided numbness and pain and it showed small scattered embolic appearing infarcts but stroke risk factor workup was deferred due to more emergent cardiac intervention and complete resolution of neurological symptoms.  Neurology was formally consulted today for stroke recommendations. Denies any tingling numbness weakness headache or visual changes.   LKW: Somewhere prior to 12/31/2022 with an unclear last known well.  Transient symptoms at the time IV thrombolysis given?: no, outside the window Premorbid modified Rankin scale (mRS): 1   ROS: Full ROS was performed and is negative except as noted in the HPI.   Past Medical History:  Diagnosis Date   Abdominal discomfort    Chronic abdominal and pelvic pain resulting in significant loss of time from work   Anxiety    with depression   Asthma    Chest pain    Depression    Diabetes mellitus    Drug overdose 06/12/2007   60 Naprosyn, psychiatric admission   Hyperlipidemia    Severe   Hypertension    Migraines    Tremor of both hands    on propranolol     History reviewed. No pertinent family history.   Social History:   reports that she has never smoked. She has never used smokeless tobacco. She reports that she does not drink alcohol and does not use drugs.  Medications  Current Facility-Administered Medications:    0.45 % sodium chloride infusion, , Intravenous, Continuous PRN, Roddenberry, Myron G, PA-C, Stopped at 01/02/23 1207   0.9 %  sodium chloride infusion, 250 mL, Intravenous, Continuous, Roddenberry, Myron G, PA-C   0.9 %  sodium chloride infusion, , Intravenous, Continuous, Roddenberry, Myron G, PA-C   0.9 %  sodium chloride infusion, 250 mL, Intravenous, PRN, Lightfoot, Eliezer Lofts, MD   acetaminophen (TYLENOL) tablet 1,000 mg, 1,000 mg, Oral, Q6H, 1,000 mg at 01/03/23 1206 **OR** acetaminophen (TYLENOL) 160 MG/5ML solution 1,000 mg, 1,000 mg, Per Tube, Q6H, Roddenberry, Myron G, PA-C, 1,000 mg at 01/01/23 2323   albumin human 5 % solution 12.5 g, 250 mL, Intravenous, Q15 min PRN, Roddenberry, Myron G, PA-C, Stopping previously hung infusion at 01/02/23 0018   atorvastatin (LIPITOR) tablet 80 mg, 80 mg, Oral, Daily, Roddenberry, Myron G, PA-C, 80 mg at 01/03/23 0904   bisacodyl (DULCOLAX) EC tablet 10 mg, 10 mg, Oral, Daily, 10 mg at 01/03/23 0904 **OR** bisacodyl (DULCOLAX) suppository 10 mg, 10 mg, Rectal, Daily, Roddenberry, Myron G, PA-C   Chlorhexidine Gluconate Cloth 2 % PADS 6 each, 6 each, Topical, Daily, Lightfoot, Harrell O, MD, 6 each at 01/03/23 0905   dextrose 50 % solution 0-50 mL, 0-50 mL, Intravenous, PRN, Roddenberry, Myron G, PA-C   docusate sodium (COLACE) capsule  200 mg, 200 mg, Oral, BID, Lightfoot, Harrell O, MD, 200 mg at 01/03/23 0904   enoxaparin (LOVENOX) injection 40 mg, 40 mg, Subcutaneous, QHS, Lightfoot, Harrell O, MD, 40 mg at 01/02/23 2159   fentaNYL (SUBLIMAZE) injection 50-100 mcg, 50-100 mcg, Intravenous, Q1H PRN, Roddenberry, Myron G, PA-C, 50 mcg at 01/02/23 0332   CBG monitoring, , , Q4H  **AND** insulin aspart (novoLOG) injection 0-24 Units, 0-24 Units, Subcutaneous, Q4H, Lightfoot, Harrell O, MD, 4 Units at 01/03/23 1206   insulin glargine-yfgn (SEMGLEE) injection 10 Units, 10 Units, Subcutaneous, QHS, Gleason, Darcella Gasman, PA-C   insulin regular, human (MYXREDLIN) 100 units/ 100 mL infusion, , Intravenous, Continuous, Lightfoot, Harrell O, MD, Stopped at 01/02/23 1058   lactated ringers infusion, , Intravenous, Continuous, Roddenberry, Myron G, PA-C   lactated ringers infusion, , Intravenous, Continuous, Roddenberry, Myron G, PA-C, Stopped at 01/02/23 1055   metoprolol tartrate (LOPRESSOR) injection 2.5-5 mg, 2.5-5 mg, Intravenous, Q2H PRN, Roddenberry, Myron G, PA-C   metoprolol tartrate (LOPRESSOR) tablet 12.5 mg, 12.5 mg, Oral, BID, Lightfoot, Harrell O, MD, 12.5 mg at 01/03/23 0904   midazolam (VERSED) injection 2 mg, 2 mg, Intravenous, Q1H PRN, Roddenberry, Myron G, PA-C   ondansetron (ZOFRAN) injection 4 mg, 4 mg, Intravenous, Q6H PRN, Roddenberry, Myron G, PA-C   Oral care mouth rinse, 15 mL, Mouth Rinse, PRN, Lightfoot, Harrell O, MD   oxyCODONE (Oxy IR/ROXICODONE) immediate release tablet 5-10 mg, 5-10 mg, Oral, Q3H PRN, Corliss Skains, MD, 5 mg at 01/03/23 0531   pantoprazole (PROTONIX) EC tablet 40 mg, 40 mg, Oral, Daily, Roddenberry, Myron G, PA-C, 40 mg at 01/03/23 0904   polyethylene glycol (MIRALAX / GLYCOLAX) packet 17 g, 17 g, Oral, BID, Azucena Fallen, MD, 17 g at 01/03/23 1206   sodium chloride flush (NS) 0.9 % injection 10-40 mL, 10-40 mL, Intracatheter, Q12H, Lightfoot, Harrell O, MD, 10 mL at 01/03/23 1032   sodium chloride flush (NS) 0.9 % injection 10-40 mL, 10-40 mL, Intracatheter, PRN, Lightfoot, Harrell O, MD   sodium chloride flush (NS) 0.9 % injection 3 mL, 3 mL, Intravenous, Q12H, Roddenberry, Myron G, PA-C, 3 mL at 01/03/23 1031   sodium chloride flush (NS) 0.9 % injection 3 mL, 3 mL, Intravenous, PRN, Roddenberry, Myron G, PA-C   sodium  chloride flush (NS) 0.9 % injection 3 mL, 3 mL, Intravenous, Q12H, Lightfoot, Harrell O, MD, 3 mL at 01/03/23 1032   sodium chloride flush (NS) 0.9 % injection 3 mL, 3 mL, Intravenous, PRN, Lightfoot, Harrell O, MD   topiramate (TOPAMAX) tablet 50 mg, 50 mg, Oral, BID, Roddenberry, Myron G, PA-C, 50 mg at 01/03/23 1031   traMADol (ULTRAM) tablet 50-100 mg, 50-100 mg, Oral, Q4H PRN, Corliss Skains, MD, 100 mg at 01/03/23 1610   Exam: Current vital signs: BP (!) 116/59   Pulse 80   Temp 98.3 F (36.8 C) (Oral)   Resp (!) 24   Ht 5\' 4"  (1.626 m)   Wt 95.4 kg   SpO2 98%   BMI 36.10 kg/m  Vital signs in last 24 hours: Temp:  [98.3 F (36.8 C)-100.3 F (37.9 C)] 98.3 F (36.8 C) (07/25 1125) Pulse Rate:  [80-102] 80 (07/25 1520) Resp:  [14-31] 24 (07/25 1520) BP: (113-146)/(50-70) 116/59 (07/25 1520) SpO2:  [95 %-100 %] 98 % (07/25 1520) Weight:  [95.4 kg] 95.4 kg (07/25 0600)  GENERAL: Awake, alert in NAD HEENT: Normocephalic/atraumatic: CVS: Regular rate rhythm. Chest: Incision clean and dry.  Clear breath sounds Neurological  exam Awake alert oriented x 3.  No dysarthria.  No evidence of aphasia.  Cranial nerves II to XII intact.  Motor examination with symmetric strength all over.  Sensation intact to light touch without extinction.  Coordination examination with no dysmetria. NIH stroke scale-0  Labs I have reviewed labs in epic and the results pertinent to this consultation are: CBC    Component Value Date/Time   WBC 10.9 (H) 01/03/2023 0405   RBC 3.21 (L) 01/03/2023 0405   HGB 9.6 (L) 01/03/2023 0405   HGB 12.3 11/26/2022 1619   HCT 29.2 (L) 01/03/2023 0405   HCT 37.6 11/26/2022 1619   PLT 111 (L) 01/03/2023 0405   PLT 216 11/26/2022 1619   MCV 91.0 01/03/2023 0405   MCV 88 11/26/2022 1619   MCH 29.9 01/03/2023 0405   MCHC 32.9 01/03/2023 0405   RDW 14.4 01/03/2023 0405   RDW 14.4 11/26/2022 1619   LYMPHSABS 2.0 12/27/2022 1618   LYMPHSABS 3.3 (H)  11/26/2022 1619   MONOABS 0.6 12/27/2022 1618   EOSABS 0.1 12/27/2022 1618   EOSABS 0.1 11/26/2022 1619   BASOSABS 0.1 12/27/2022 1618   BASOSABS 0.1 11/26/2022 1619    CMP     Component Value Date/Time   NA 136 01/03/2023 0405   NA 138 11/26/2022 1619   K 4.1 01/03/2023 0405   CL 107 01/03/2023 0405   CO2 22 01/03/2023 0405   GLUCOSE 196 (H) 01/03/2023 0405   BUN 9 01/03/2023 0405   BUN 28 (H) 11/26/2022 1619   CREATININE 0.83 01/03/2023 0405   CREATININE 0.77 09/24/2012 0853   CALCIUM 8.3 (L) 01/03/2023 0405   PROT 5.8 (L) 12/29/2022 0516   PROT 7.1 11/26/2022 1619   ALBUMIN 3.3 (L) 12/29/2022 0516   ALBUMIN 4.6 11/26/2022 1619   AST 19 12/29/2022 0516   ALT 23 12/29/2022 0516   ALKPHOS 41 12/29/2022 0516   BILITOT 0.4 12/29/2022 0516   BILITOT <0.2 11/26/2022 1619   GFRNONAA >60 01/03/2023 0405   GFRNONAA >89 09/24/2012 0853   GFRAA 63 08/04/2020 0905   GFRAA >89 09/24/2012 0853    Lipid Panel     Component Value Date/Time   CHOL 261 (H) 12/28/2022 0453   CHOL 365 (H) 11/26/2022 1619   CHOL 328 (H) 09/24/2012 0853   TRIG 594 (H) 12/28/2022 0453   TRIG 530 (H) 09/08/2014 1558   TRIG 497 (H) 09/24/2012 0853   TRIG 263 03/28/2010 0000   HDL 38 (L) 12/28/2022 0453   HDL 48 11/26/2022 1619   HDL 37 (L) 09/08/2014 1558   HDL 40 09/24/2012 0853   CHOLHDL 6.9 12/28/2022 0453   VLDL UNABLE TO CALCULATE IF TRIGLYCERIDE OVER 400 mg/dL 16/03/9603 5409   LDLCALC UNABLE TO CALCULATE IF TRIGLYCERIDE OVER 400 mg/dL 81/19/1478 2956   LDLCALC 175 (H) 11/26/2022 1619   LDLCALC Comment 12/31/2013 0914   LDLCALC 189 (H) 09/24/2012 0853   LDLDIRECT 136 (H) 12/28/2022 0453    Lab Results  Component Value Date   HGBA1C 8.6 (H) 12/31/2022    2D echo: LVEF 60 to 65%, LV diastolic parameters normal, RV systolic function normal.  Left atrial size mildly dilated.  Trivial mitral valve regurgitation.  Imaging I have reviewed the images obtained:  MR brain with punctate acute  nonhemorrhagic infarctions involving the left occipital lobe and right cerebellum along with additional probable punctate subacute ischemic nonhemorrhagic infarctions in the left frontal centrum semiovale. Periprocedural versus cardioembolic etiology  CT head and neck pending  Assessment: 62 year-old woman with above past medical history, status post CABG, was worked up with an MRI of the brain with complaints of some right-sided transient numbness and weakness on 12/31/2022 with unclear last known well.  She was not a candidate for treatment due to transient symptoms and unclear last known well.  She underwent CABG.  Stroke team was consulted for further recommendations on antiplatelets and preventive treatment. She has had workup nearly completed excluding head and neck vessel imaging. 2D echo shows mild LA dilatation raising concern for underlying paroxysmal atrial fibrillation but it is more likely that the strokes are likely periprocedural from the cardiac cath that she had. Irrespective, we will complete the workup  Recommendations: Continue telemetry CTA head and neck Likely will need outpatient 30-day cardiac monitoring if there is no arrhythmia captured on telemetry Aspirin when okay with cardiothoracic surgery High intensity statin for goal LDL less than 70. Diabetes management-goal A1c less than 7. PT OT speech therapy Follow-up with stroke clinic nurse practitioner at [facility] neurology in 8 to 12 weeks after discharge. Plan was discussed with Dr. Natale Milch  -- Milon Dikes, MD Neurologist [facility] Pager: [phone]

## 2023-01-03 NOTE — Progress Notes (Signed)
[address]       [place] [address]             [phone]                 2 Days Post-Op Procedure(s) (LRB): CORONARY ARTERY BYPASS GRAFTING (CABG) TIMES THREE USING THE LEFT INTERNAL MAMMARY ARTERY ([place]) AND ENDOSCOPICALLY HARVESTED RIGHT GREATER SAPHENOUS VEIN (N/A) TRANSESOPHAGEAL ECHOCARDIOGRAM (N/A)   Events: none _______________________________________________________________ Vitals: BP 120/60   Pulse 94   Temp 98.9 F (37.2 C) (Oral)   Resp (!) 23   Ht 5\' 4"  (1.626 m)   Wt 95.4 kg   SpO2 97%   BMI 36.10 kg/m  Filed Weights   01/01/23 1056 01/02/23 0500 01/03/23 0600  Weight: 90.8 kg 97.4 kg 95.4 kg     - Neuro: alert NAD  - Cardiovascular: sinus  Drips:   CVP:  [2 mmHg-13 mmHg] 3 mmHg CO:  [5.8 L/min-6.8 L/min] 6.4 L/min CI:  [3 L/min/m2-3.5 L/min/m2] 3.3 L/min/m2  - Pulm: EWOB  ABG    Component Value Date/Time   PHART 7.366 01/02/2023 0334   PCO2ART 34.0 01/02/2023 0334   PO2ART 87 01/02/2023 0334   HCO3 19.4 (L) 01/02/2023 0334   TCO2 20 (L) 01/02/2023 0334   ACIDBASEDEF 5.0 (H) 01/02/2023 0334   O2SAT 96 01/02/2023 0334    - Abd: ND - Extremity: warm  .Intake/Output      07/24 0701 07/25 0700 07/25 0701 07/26 0700   P.O. 1720    I.V. (mL/kg) 210.4 (2.2)    Blood     IV Piggyback 338.3    Total Intake(mL/kg) 2268.7 (23.8)    Urine (mL/kg/hr) 2185 (1)    Drains     Blood     Chest Tube 80    Total Output 2265    Net +3.7            _______________________________________________________________ Labs:    Latest Ref Rng & Units 01/03/2023    4:05 AM 01/02/2023    3:31 PM 01/02/2023    4:01 AM  CBC  WBC 4.0 - 10.5 K/uL 10.9  16.1  16.2   Hemoglobin 12.0 - 15.0 g/dL 9.6  96.2  95.2   Hematocrit 36.0 - 46.0 % 29.2  31.1  31.3   Platelets 150 - 400 K/uL 111  125  138       Latest Ref Rng & Units 01/03/2023    4:05 AM 01/02/2023    3:31 PM 01/02/2023    4:01 AM  CMP  Glucose 70 - 99 mg/dL 841  324   401   BUN 8 - 23 mg/dL 9  12  14    Creatinine 0.44 - 1.00 mg/dL 0.27  2.53  6.64   Sodium 135 - 145 mmol/L 136  133  139   Potassium 3.5 - 5.1 mmol/L 4.1  3.6  3.3   Chloride 98 - 111 mmol/L 107  108  109   CO2 22 - 32 mmol/L 22  19  22    Calcium 8.9 - 10.3 mg/dL 8.3  7.7  8.1     CXR: PV congestion  _______________________________________________________________  Assessment and Plan: POD 2 s/p CABG  Neuro: pain controlled CV: will d/c dob, restarting BB at low dose.    Pulm: IS, ambulation Renal: creat stable.   Will diurese GI: on diet Heme: stable ID: afebrile Endo: SSI Dispo: floor today   Corliss Skains 01/03/2023 7:41 AM

## 2023-01-03 NOTE — Progress Notes (Signed)
PROGRESS NOTE  [name] ZOX:[id number] DOB: [birth year] DOA: 12/27/2022 PCP: Bennie Pierini, FNP   LOS: 7 days   Brief Narrative / Interim history: 62 y.o. female with medical history significant for T2DM, HTN, HLD, depression/anxiety who presented to the ED for evaluation of chest pain and near syncopal episode. Patient states that for the last week she has been having intermittent chest pain.  This is described as pressure-like discomfort across her chest, to her back, and down her left arm.  This is worse with exertion.  She says normally walks her dogs 1.5 miles however since symptoms started she can barely walk a block before her symptoms occur.  Symptoms are associated with shortness of breath.  She has had the symptoms occasionally while at rest as well. She also reports nausea. She was seen by [facility] cardiology 12/24/2022 who recommended outpatient stress testing and echocardiogram, not yet performed. On the day of admission she was at a restaurant with friends when she suddenly became diaphoretic and nauseous while she was sitting down.   Subjective / 24h Interval events: .  Assesement and Plan: Principal Problem:   Chest pain Active Problems:   Hypotension   AKI (acute kidney injury) (HCC)   Type 2 diabetes mellitus (HCC)   Hyperlipidemia associated with type 2 diabetes mellitus (HCC)   Non-ST elevation (NSTEMI) myocardial infarction [facility])   Coronary artery disease due to lipid rich plaque   Pure hypercholesterolemia   S/P CABG x 3  Chest pain in the setting of unstable angina, severe multivessel coronary artery disease -Cardiology following -Cardiac cath 7/19 shows severe multivessel disease LVEDP 13 mmHg -Three-vessel CABG 7/23 -initially required dobutamine overnight now discontinued -Asa allergy with anaphylaxis reported  Acute ischemic non-hemorrhagic punctate infarcts of the L occiput; R cerebellum -Initial event evening 7/22, symptoms resolved  overnight with no repeat episodes  -MRI does confirm acute ischemic nonhemorrhagic punctate infarct -Given patient's recent surgery requiring multiple medications and ICU coverage we will hold off on any further stroke workup at this time until more stabilized -Will sideline Neuro for recommendations on discharge medications in regards to antiplatelets/duration.  Leukocytosis, likely reactive -Secondary to recent procedure, no signs or symptoms of infection, continue to follow closely - downtrending appropriatlely  Hypotension with history of hypertension -Questionably secondary to above, resolving - off dobutamine 7/25   Acute kidney injury, resolved -Likely secondary to initial hypotension as above   Type 2 diabetes, uncontrolled, with hyperglycemia  -Uncontrolled with A1c 9.1 -Continue sliding scale insulin, hypoglycemic protocol diabetic diet -Hold home glycemic medications -Advance diet as tolerated back to diabetic diet   Hyperlipidemia - Continue atorvastatin. Lipid panel shows poorly controled profile   Depression/anxiety - Continue Wellbutrin. Code status: Full code lengthy discussion with patient and friend at bedside, it appears patient was unclear about what full code versus DNR meant.  Recommend follow-up with MOST form as her wishes are essentially not to be maintained in a vegetative state if recovery is unclear, but otherwise continue all medical care at full code.  Family Communication: no family at bedside, offered to call Sister -patient to update  Status is: Inpatient Remains inpatient appropriate because: severity of illness Level of care: ICU  Consultants:  Cardiology, cardiothoracic surgery, PCCM  Objective: Vitals:   01/03/23 0100 01/03/23 0200 01/03/23 0300 01/03/23 0600  BP: (!) 126/55 (!) 128/58 (!) 136/59 120/60  Pulse: 95 94 96 94  Resp: 19 18 (!) 23 (!) 23  Temp:   98.9 F (37.2 C)  TempSrc:   Oral   SpO2: 98% 98% 97% 97%  Weight:    95.4 kg   Height:        Intake/Output Summary (Last 24 hours) at 01/03/2023 0719 Last data filed at 01/03/2023 1610 Gross per 24 hour  Intake 2268.66 ml  Output 2265 ml  Net 3.66 ml   Wt Readings from Last 3 Encounters:  01/03/23 95.4 kg  11/26/22 93 kg  08/21/22 91.2 kg    Examination:  Constitutional: NAD Eyes: no scleral icterus ENMT: Mucous membranes are moist.  Neck: normal, supple Respiratory: clear to auscultation bilaterally, no wheezing, no crackles.  Cardiovascular: Prior drains removed, sternotomy site clean dry intact Abdomen: non distended, no tenderness. Bowel sounds positive.  Extremities: Without overt edema/focal neurodeficits  Data Reviewed: I have independently reviewed following labs and imaging studies   CBC Recent Labs  Lab 12/27/22 1618 12/27/22 1635 01/01/23 1729 01/01/23 1914 01/01/23 2200 01/02/23 0157 01/02/23 0334 01/02/23 0401 01/02/23 1531 01/03/23 0405  WBC 10.5   < > 13.0*  --  19.4*  --   --  16.2* 16.1* 10.9*  HGB 11.8*   < > 10.7*   < > 11.7* 10.5* 10.5* 11.0* 10.6* 9.6*  HCT 36.4   < > 31.2*   < > 34.2* 31.0* 31.0* 31.3* 31.1* 29.2*  PLT 204   < > 109*  --  156  --   --  138* 125* 111*  MCV 93.1   < > 89.9  --  87.0  --   --  87.9 90.1 91.0  MCH 30.2   < > 30.8  --  29.8  --   --  30.9 30.7 29.9  MCHC 32.4   < > 34.3  --  34.2  --   --  35.1 34.1 32.9  RDW 13.6   < > 13.0  --  13.2  --   --  13.5 14.2 14.4  LYMPHSABS 2.0  --   --   --   --   --   --   --   --   --   MONOABS 0.6  --   --   --   --   --   --   --   --   --   EOSABS 0.1  --   --   --   --   --   --   --   --   --   BASOSABS 0.1  --   --   --   --   --   --   --   --   --    < > = values in this interval not displayed.    Recent Labs  Lab 12/27/22 1618 12/27/22 1635 12/29/22 0516 12/30/22 0407 12/31/22 0403 12/31/22 0411 12/31/22 2034 01/01/23 0005 01/01/23 1420 01/01/23 1627 01/01/23 1726 01/01/23 1729 01/01/23 1914 01/01/23 2200 01/02/23 0157  01/02/23 0334 01/02/23 0401 01/02/23 1531 01/03/23 0405  NA 135   < > 137 135   < >  --   --  133*   < > 137   < >  --    < > 137 143 144 139 133* 136  K 3.8   < > 4.1 3.7   < >  --   --  3.7   < > 4.7   < >  --    < > 4.5 3.6 3.4* 3.3* 3.6 4.1  CL 108   < >  107 105   < >  --   --  106   < > 109  --   --   --  110  --   --  109 108 107  CO2 17*   < > 22 18*   < >  --   --  19*  --   --   --   --   --  20*  --   --  22 19* 22  GLUCOSE 294*   < > 213* 210*   < >  --   --  261*   < > 158*  --   --   --  190*  --   --  150* 234* 196*  BUN 27*   < > 12 15   < >  --   --  26*   < > 18  --   --   --  16  --   --  14 12 9   CREATININE 1.19*   < > 0.88 0.95   < >  --   --  1.22*   < > 0.70  --   --   --  0.91  --   --  0.87 0.85 0.83  CALCIUM 8.8*   < > 9.0 9.0   < >  --   --  9.3  --   --   --   --   --  7.7*  --   --  8.1* 7.7* 8.3*  AST  --   --  19  --   --   --   --   --   --   --   --   --   --   --   --   --   --   --   --   ALT  --   --  23  --   --   --   --   --   --   --   --   --   --   --   --   --   --   --   --   ALKPHOS  --   --  41  --   --   --   --   --   --   --   --   --   --   --   --   --   --   --   --   BILITOT  --   --  0.4  --   --   --   --   --   --   --   --   --   --   --   --   --   --   --   --   ALBUMIN  --   --  3.3*  --   --   --   --   --   --   --   --   --   --   --   --   --   --   --   --   MG  --   --  1.3* 1.9  --   --   --   --   --   --   --   --   --  2.9*  --   --  2.2 1.9  --   DDIMER <0.27  --   --   --   --   --   --   --   --   --   --   --   --   --   --   --   --   --   --  INR  --   --   --   --   --   --  1.0  --   --   --   --  1.3*  --   --   --   --   --   --   --   HGBA1C  --   --   --   --   --  8.6*  --   --   --   --   --   --   --   --   --   --   --   --   --   BNP 46.8  --   --   --   --   --   --   --   --   --   --   --   --   --   --   --   --   --   --    < > = values in this interval not displayed.    Lab Results  Component Value  Date   HGBA1C 8.6 (H) 12/31/2022      Component Value Date/Time   BNP 46.8 12/27/2022 1618    CBG: Recent Labs  Lab 01/02/23 1106 01/02/23 1538 01/02/23 1941 01/02/23 2345 01/03/23 0408  GLUCAP 131* 227* 214* 207* 192*    Recent Results (from the past 240 hour(s))  SARS Coronavirus 2 by RT PCR (hospital order, performed in [facility] hospital lab) *cepheid single result test* Anterior Nasal Swab     Status: None   Collection Time: 12/27/22  4:18 PM   Specimen: Anterior Nasal Swab  Result Value Ref Range Status   SARS Coronavirus 2 by RT PCR NEGATIVE NEGATIVE Final    Comment: (NOTE) SARS-CoV-2 target nucleic acids are NOT DETECTED.  The SARS-CoV-2 RNA is generally detectable in upper and lower respiratory specimens during the acute phase of infection. The lowest concentration of SARS-CoV-2 viral copies this assay can detect is 250 copies / mL. A negative result does not preclude SARS-CoV-2 infection and should not be used as the sole basis for treatment or other patient management decisions.  A negative result may occur with improper specimen collection / handling, submission of specimen other than nasopharyngeal swab, presence of viral mutation(s) within the areas targeted by this assay, and inadequate number of viral copies (<250 copies / mL). A negative result must be combined with clinical observations, patient history, and epidemiological information.  Fact Sheet for Patients:   RoadLapTop.co.za  Fact Sheet for Healthcare Providers: http://kim-miller.com/  This test is not yet approved or  cleared by the [place] FDA and has been authorized for detection and/or diagnosis of SARS-CoV-2 by FDA under an Emergency Use Authorization (EUA).  This EUA will remain in effect (meaning this test can be used) for the duration of the COVID-19 declaration under Section 564(b)(1) of the Act, 21 U.S.C. section 360bbb-3(b)(1),  unless the authorization is terminated or revoked sooner.  Performed at [facility], [address], [place], [place] [address]   Surgical pcr screen     Status: None   Collection Time: 12/31/22 11:22 PM   Specimen: Nasal Mucosa; Nasal Swab  Result Value Ref Range Status   MRSA, PCR NEGATIVE NEGATIVE Final   Staphylococcus aureus NEGATIVE NEGATIVE Final    Comment: (NOTE) The Xpert SA Assay (FDA approved for NASAL specimens in patients 65 years of age and older), is one component of  a comprehensive surveillance program. It is not intended to diagnose infection nor to guide or monitor treatment. Performed at [facility], [address], [place], [place] [address]      Radiology Studies: No results found.  Carma Leaven DO [facility]  Between 7 pm - 7 am I am not available, please contact night coverage MD/APP via Amion

## 2023-01-03 NOTE — Anesthesia Postprocedure Evaluation (Signed)
Anesthesia Post Note  Patient: [name]  Procedure(s) Performed: CORONARY ARTERY BYPASS GRAFTING (CABG) TIMES THREE USING THE LEFT INTERNAL MAMMARY ARTERY (LIMA) AND ENDOSCOPICALLY HARVESTED RIGHT GREATER SAPHENOUS VEIN (Chest) TRANSESOPHAGEAL ECHOCARDIOGRAM     Patient location during evaluation: SICU Anesthesia Type: General Level of consciousness: sedated Pain management: pain level controlled Vital Signs Assessment: post-procedure vital signs reviewed and stable Respiratory status: patient remains intubated per anesthesia plan Cardiovascular status: stable Postop Assessment: no apparent nausea or vomiting Anesthetic complications: no   No notable events documented.  Last Vitals:  Vitals:   01/03/23 1900 01/03/23 2000  BP: 135/62 129/71  Pulse: 81 83  Resp: 20 18  Temp:  36.9 C  SpO2: 94% 95%    Last Pain:  Vitals:   01/03/23 2000  TempSrc: Oral  PainSc:                  Nelle Don [place]

## 2023-01-03 NOTE — Progress Notes (Signed)
NAME:  [name], MRN:  [id number], DOB:  10-23-1960, LOS: 7 ADMISSION DATE:  12/27/2022, CONSULTATION DATE: 01/01/2023 REFERRING MD: Dr. Cliffton Asters, CHIEF COMPLAINT: S/p CABG  History of Present Illness:  62 year old female with diabetes, hypertension and hyperlipidemia who initially presented with chest pain, was admitted with acute NSTEMI, underwent cardiac cath which showed multivessel coronary artery disease, today she underwent CABG x 3.  Remained intubated, PCCM was consulted for help evaluation medical management EBL 1600 Received 1 unit PRBC and 300 Cell Saver  Pertinent  Medical History   Past Medical History:  Diagnosis Date   Abdominal discomfort    Chronic abdominal and pelvic pain resulting in significant loss of time from work   Anxiety    with depression   Asthma    Chest pain    Depression    Diabetes mellitus    Drug overdose 06/12/2007   60 Naprosyn, psychiatric admission   Hyperlipidemia    Severe   Hypertension    Migraines    Tremor of both hands    on propranolol     Significant Hospital Events: Including procedures, antibiotic start and stop dates in addition to other pertinent events   7/23 CABG  7/24 extubated and awake, remains on dobutamine  7/25 continuing to make progress   Interim History / Subjective:  Extubated via rapid wean pathway Awake, feeling ok   Objective   Blood pressure (!) 116/59, pulse 90, temperature 98.9 F (37.2 C), temperature source Oral, resp. rate 18, height 5\' 4"  (1.626 m), weight 95.4 kg, SpO2 97%. CVP:  [2 mmHg-13 mmHg] 3 mmHg CO:  [5.8 L/min-6.8 L/min] 6.4 L/min CI:  [3 L/min/m2-3.5 L/min/m2] 3.3 L/min/m2      Intake/Output Summary (Last 24 hours) at 01/03/2023 0753 Last data filed at 01/03/2023 4098 Gross per 24 hour  Intake 2268.66 ml  Output 2265 ml  Net 3.66 ml   Filed Weights   01/01/23 1056 01/02/23 0500 01/03/23 0600  Weight: 90.8 kg 97.4 kg 95.4 kg    General:  chronically and acutely  ill-appearing F in no acute distress HEENT: MM pink/moist Neuro: alert and oriented  CV: s1s2 rrr, no m/r/g PULM:  chest and mediastinal tubes in place, clear bilaterally on 3L [place] GI: soft, non-tender  Extremities: warm/dry, 1+ edema  Skin: no rashes or lesions  Labs: K 3.3 Mag 2.2 WBC 16.2 Platelets 138  Resolved Hospital Problem list   Acute kidney injury  Assessment & Plan:   Coronary artery disease s/p CABG x 3 Progressing, ambulating and stable -management per TCTS, start lasix and bb today -plan for chest tube removal and transfer out of ICU today -IS and ambulate as able -multimodal pain control -d/c dobutamine today -Continue multi-modal pain control with tramadol, oxycodone and morphine -allergic to Asa  Acute respiratory insufficiency, postop Improved, extubated -[place] O2 to maintain sats >90% and IS  Hypertension -continue holding  antihypertensive for now  Hyperlipidemia -Continue atorvastatin  Diabetes type 2 hemoglobin A1c is 8.6 -SSI  Expected perioperative blood loss anemia Thrombocytopenia due to CPB S/p 1 unit of PRBC -Hgb stable and platelets improved    Best Practice (right click and "Reselect all SmartList Selections" daily)   Diet/type: Regular consistency (see orders) DVT prophylaxis: SCD GI prophylaxis: PPI Lines: Central line, Arterial Line, and No longer needed.  Order written to d/c  Foley:  Yes, and it is still needed Code Status:  full code Last date of multidisciplinary goals of care discussion [Per primary  team]  Labs   CBC: Recent Labs  Lab 12/27/22 1618 12/27/22 1635 01/01/23 1729 01/01/23 1914 01/01/23 2200 01/02/23 0157 01/02/23 0334 01/02/23 0401 01/02/23 1531 01/03/23 0405  WBC 10.5   < > 13.0*  --  19.4*  --   --  16.2* 16.1* 10.9*  NEUTROABS 7.4  --   --   --   --   --   --   --   --   --   HGB 11.8*   < > 10.7*   < > 11.7* 10.5* 10.5* 11.0* 10.6* 9.6*  HCT 36.4   < > 31.2*   < > 34.2* 31.0* 31.0* 31.3*  31.1* 29.2*  MCV 93.1   < > 89.9  --  87.0  --   --  87.9 90.1 91.0  PLT 204   < > 109*  --  156  --   --  138* 125* 111*   < > = values in this interval not displayed.    Basic Metabolic Panel: Recent Labs  Lab 12/29/22 0516 12/30/22 0407 12/31/22 0403 01/01/23 0005 01/01/23 1420 01/01/23 1627 01/01/23 1726 01/01/23 2200 01/02/23 0157 01/02/23 0334 01/02/23 0401 01/02/23 1531 01/03/23 0405  NA 137 135   < > 133*   < > 137   < > 137 143 144 139 133* 136  K 4.1 3.7   < > 3.7   < > 4.7   < > 4.5 3.6 3.4* 3.3* 3.6 4.1  CL 107 105   < > 106   < > 109  --  110  --   --  109 108 107  CO2 22 18*   < > 19*  --   --   --  20*  --   --  22 19* 22  GLUCOSE 213* 210*   < > 261*   < > 158*  --  190*  --   --  150* 234* 196*  BUN 12 15   < > 26*   < > 18  --  16  --   --  14 12 9   CREATININE 0.88 0.95   < > 1.22*   < > 0.70  --  0.91  --   --  0.87 0.85 0.83  CALCIUM 9.0 9.0   < > 9.3  --   --   --  7.7*  --   --  8.1* 7.7* 8.3*  MG 1.3* 1.9  --   --   --   --   --  2.9*  --   --  2.2 1.9  --    < > = values in this interval not displayed.   GFR: Estimated Creatinine Clearance: 78.8 mL/min (by C-G formula based on SCr of 0.83 mg/dL). Recent Labs  Lab 01/01/23 2200 01/02/23 0401 01/02/23 1531 01/03/23 0405  WBC 19.4* 16.2* 16.1* 10.9*    Liver Function Tests: Recent Labs  Lab 12/29/22 0516  AST 19  ALT 23  ALKPHOS 41  BILITOT 0.4  PROT 5.8*  ALBUMIN 3.3*   No results for input(s): "LIPASE", "AMYLASE" in the last 168 hours. No results for input(s): "AMMONIA" in the last 168 hours.  ABG    Component Value Date/Time   PHART 7.366 01/02/2023 0334   PCO2ART 34.0 01/02/2023 0334   PO2ART 87 01/02/2023 0334   HCO3 19.4 (L) 01/02/2023 0334   TCO2 20 (L) 01/02/2023 0334   ACIDBASEDEF 5.0 (H) 01/02/2023 0334   O2SAT 96  01/02/2023 0334     Coagulation Profile: Recent Labs  Lab 12/31/22 2034 01/01/23 1729  INR 1.0 1.3*    Cardiac Enzymes: No results for input(s):  "CKTOTAL", "CKMB", "CKMBINDEX", "TROPONINI" in the last 168 hours.  HbA1C: HB A1C (BAYER DCA - WAIVED)  Date/Time Value Ref Range Status  11/26/2022 04:17 PM 9.1 (H) 4.8 - 5.6 % Final    Comment:             Prediabetes: 5.7 - 6.4          Diabetes: >6.4          Glycemic control for adults with diabetes: <7.0   08/21/2022 03:46 PM 9.2 (H) 4.8 - 5.6 % Final    Comment:             Prediabetes: 5.7 - 6.4          Diabetes: >6.4          Glycemic control for adults with diabetes: <7.0    Hgb A1c MFr Bld  Date/Time Value Ref Range Status  12/31/2022 04:11 AM 8.6 (H) 4.8 - 5.6 % Final    Comment:    (NOTE) Pre diabetes:          5.7%-6.4%  Diabetes:              >6.4%  Glycemic control for   <7.0% adults with diabetes     CBG: Recent Labs  Lab 01/02/23 1106 01/02/23 1538 01/02/23 1941 01/02/23 2345 01/03/23 0408  GLUCAP 131* 227* 214* 207* 192*    Review of Systems:   Unable to obtain as patient is intubated and sedated  Past Medical History:  She,  has a past medical history of Abdominal discomfort, Anxiety, Asthma, Chest pain, Depression, Diabetes mellitus, Drug overdose (06/12/2007), Hyperlipidemia, Hypertension, Migraines, and Tremor of both hands.   Surgical History:   Past Surgical History:  Procedure Laterality Date   ABDOMINAL HYSTERECTOMY  02/10/2007   + lysis of adhesions   APPENDECTOMY  1990s   ruptured, late 90s   BILATERAL OOPHORECTOMY      in 2 surgeries prior to 9/08   CARPAL TUNNEL RELEASE Right 08/16/2021   Procedure: RIGHT CARPAL TUNNEL RELEASE;  Surgeon: Marlyne Beards, MD;  Location: [facility];  Service: Orthopedics;  Laterality: Right;   COLONOSCOPY  02/09/2010    normal upper endoscopy, single colonic polyp,   CORONARY ARTERY BYPASS GRAFT N/A 01/01/2023   Procedure: CORONARY ARTERY BYPASS GRAFTING (CABG) TIMES THREE USING THE LEFT INTERNAL MAMMARY ARTERY (LIMA) AND ENDOSCOPICALLY HARVESTED RIGHT GREATER SAPHENOUS VEIN;   Surgeon: Corliss Skains, MD;  Location: [facility] OR;  Service: Open Heart Surgery;  Laterality: N/A;   HERNIA REPAIR     LEFT HEART CATH AND CORONARY ANGIOGRAPHY N/A 12/28/2022   Procedure: LEFT HEART CATH AND CORONARY ANGIOGRAPHY;  Surgeon: [name], MD;  Location: [facility] INVASIVE CV LAB;  Service: Cardiovascular;  Laterality: N/A;   TEE WITHOUT CARDIOVERSION N/A 01/01/2023   Procedure: TRANSESOPHAGEAL ECHOCARDIOGRAM;  Surgeon: Corliss Skains, MD;  Location: [facility] OR;  Service: Open Heart Surgery;  Laterality: N/A;   VENTRAL HERNIA REPAIR     x3     Social History:   reports that she has never smoked. She has never used smokeless tobacco. She reports that she does not drink alcohol and does not use drugs.   Family History:  Her family history is not on file.   Allergies Allergies  Allergen Reactions   Aspirin  Anaphylaxis   Bee Venom Anaphylaxis   Benadryl [Diphenhydramine Hcl] Anaphylaxis   Morphine And Codeine Shortness Of Breath and Itching   Vicks Formula 44 Cough-Cold Pm [Dm-Apap-Cpm] Shortness Of Breath and Rash    Not anaphylaxis     Home Medications  Prior to Admission medications   Medication Sig Start Date End Date Taking? Authorizing Provider  albuterol (VENTOLIN HFA) 108 (90 Base) MCG/ACT inhaler Inhale 2 puffs into the lungs every 4 (four) hours as needed for wheezing or shortness of breath. (NEEDS TO BE SEEN) 03/30/20  Yes Dettinger, Elige Radon, MD  atorvastatin (LIPITOR) 40 MG tablet Take 40 mg by mouth daily. 12/10/22  Yes [provider]  benazepril (LOTENSIN) 20 MG tablet Take 1 tablet (20 mg total) by mouth daily. 11/26/22  Yes Martin, Mary-Margaret, FNP  buPROPion (WELLBUTRIN XL) 150 MG 24 hr tablet TAKE 3 TABLETS BY MOUTH DAILY. 12/10/22  Yes Martin, Mary-Margaret, FNP  EPINEPHrine 0.3 mg/0.3 mL IJ SOAJ injection Inject 0.3 mg into the muscle as needed for anaphylaxis. 11/22/20  Yes Daphine Deutscher, Mary-Margaret, FNP  fenofibrate 160 MG tablet Take 1 tablet (160  mg total) by mouth daily. 11/26/22  Yes Martin, Mary-Margaret, FNP  glipiZIDE (GLUCOTROL XL) 10 MG 24 hr tablet Take 1 tablet (10 mg total) by mouth daily. 11/26/22  Yes Martin, Mary-Margaret, FNP  lisinopril (ZESTRIL) 10 MG tablet Take 1 tablet (10 mg total) by mouth daily. 11/26/22  Yes Daphine Deutscher, Mary-Margaret, FNP  metFORMIN (GLUCOPHAGE) 500 MG tablet Take 1 tablet (500 mg total) by mouth 2 (two) times daily with a meal. 11/26/22  Yes Daphine Deutscher, Mary-Margaret, FNP  propranolol (INDERAL) 40 MG tablet Take 1 tablet (40 mg total) by mouth 2 (two) times daily. (Needs to be seen before next refill) 11/26/22  Yes Daphine Deutscher, Mary-Margaret, FNP  rizatriptan (MAXALT) 10 MG tablet Take 1 tablet (10 mg total) by mouth as needed for migraine. May repeat in 2 hours if needed 07/23/22  Yes Daphine Deutscher, Mary-Margaret, FNP  RYBELSUS 7 MG TABS TAKE 1 TABLET (7 MG TOTAL) BY MOUTH DAILY 08/23/22  Yes Daphine Deutscher, Mary-Margaret, FNP  topiramate (TOPAMAX) 50 MG tablet Take 1 tablet (50 mg total) by mouth 2 (two) times daily. 11/26/22  Yes Bennie Pierini, FNP     Critical care time:       Darcella Gasman Donyale Berthold, PA-C [facility] See Amion for pager If no response to pager , please call [phone] until 7pm After 7:00 pm call Elink  [phone]

## 2023-01-03 NOTE — Inpatient Diabetes Management (Signed)
Inpatient Diabetes Program Recommendations  AACE/ADA: New Consensus Statement on Inpatient Glycemic Control   Target Ranges:  Prepandial:   less than 140 mg/dL      Peak postprandial:   less than 180 mg/dL (1-2 hours)      Critically ill patients:  140 - 180 mg/dL    Latest Reference Range & Units 01/02/23 07:58 01/02/23 09:06 01/02/23 11:06 01/02/23 15:38 01/02/23 19:41 01/02/23 23:45 01/03/23 04:08 01/03/23 08:00  Glucose-Capillary 70 - 99 mg/dL 782 (H) 956 (H) 213 (H) 227 (H) 214 (H) 207 (H) 192 (H) 229 (H)   Review of Glycemic Control  Diabetes history: DM2 Outpatient Diabetes medications: Glpizide XL 10 mg daily, Metformin 500 mg BID, Rybelsus 7 mg daily Current orders for Inpatient glycemic control: Novolog 0-24 units Q4H  Inpatient Diabetes Program Recommendations:    Insulin: Please consider ordering Semglee 10 units Q24H and Novolog 3 units TID with meals for meal coverage if patient eats at least 50% of meals.  Thanks, Orlando Penner, RN, MSN, CDCES Diabetes Coordinator Inpatient Diabetes Program [phone] (Team Pager from 8am to 5pm)

## 2023-01-04 DIAGNOSIS — I2583 Coronary atherosclerosis due to lipid rich plaque: Secondary | ICD-10-CM | POA: Diagnosis not present

## 2023-01-04 DIAGNOSIS — Z951 Presence of aortocoronary bypass graft: Secondary | ICD-10-CM | POA: Diagnosis not present

## 2023-01-04 DIAGNOSIS — I214 Non-ST elevation (NSTEMI) myocardial infarction: Secondary | ICD-10-CM | POA: Diagnosis not present

## 2023-01-04 DIAGNOSIS — E7800 Pure hypercholesterolemia, unspecified: Secondary | ICD-10-CM | POA: Diagnosis not present

## 2023-01-04 DIAGNOSIS — E1142 Type 2 diabetes mellitus with diabetic polyneuropathy: Secondary | ICD-10-CM | POA: Diagnosis not present

## 2023-01-04 DIAGNOSIS — I959 Hypotension, unspecified: Secondary | ICD-10-CM

## 2023-01-04 DIAGNOSIS — I2510 Atherosclerotic heart disease of native coronary artery without angina pectoris: Secondary | ICD-10-CM | POA: Diagnosis not present

## 2023-01-04 LAB — GLUCOSE, CAPILLARY
Glucose-Capillary: 168 mg/dL — ABNORMAL HIGH (ref 70–99)
Glucose-Capillary: 170 mg/dL — ABNORMAL HIGH (ref 70–99)
Glucose-Capillary: 198 mg/dL — ABNORMAL HIGH (ref 70–99)
Glucose-Capillary: 200 mg/dL — ABNORMAL HIGH (ref 70–99)
Glucose-Capillary: 226 mg/dL — ABNORMAL HIGH (ref 70–99)
Glucose-Capillary: 246 mg/dL — ABNORMAL HIGH (ref 70–99)
Glucose-Capillary: 275 mg/dL — ABNORMAL HIGH (ref 70–99)

## 2023-01-04 MED ORDER — FUROSEMIDE 40 MG PO TABS
40.0000 mg | ORAL_TABLET | Freq: Every day | ORAL | Status: DC
  Administered 2023-01-04 – 2023-01-05 (×2): 40 mg via ORAL
  Filled 2023-01-04 (×2): qty 1

## 2023-01-04 NOTE — Progress Notes (Signed)
PT Cancellation Note  Patient Details Name: [name] MRN: [id number] DOB: [birth year]   Cancelled Treatment:    Reason Eval/Treat Not Completed: Other (comment)  Pt reports doing 3 walks today, sitting up in chair for most of the day, and just returned to bed.  States she plans to walk later tonight but declined therapy at this time.  Will f/u later date. Anise Salvo, PT Acute Rehab [facility] [phone]  Rayetta Humphrey 01/04/2023, 3:15 PM

## 2023-01-04 NOTE — Progress Notes (Signed)
CARDIAC REHAB PHASE I   Pt sitting in chair, feeling well this morning. Pt has ambulated 2 times today. Reports tolerating well. Encouraged continued ambulation and IS use. Awaiting transfer to 4E. Will continue to follow.   1100-1130 Woodroe Chen, RN BSN 01/04/2023 11:45 AM

## 2023-01-04 NOTE — Progress Notes (Signed)
Follow-up on the stroke consultation  Following up on the CT angio head and neck.  Imaging reviewed personally and agree with radiology  CT HEAD IMPRESSION: Negative CT for acute intracranial abnormality. Previously identified punctate ischemic infarcts not visible by CT.   CTA HEAD AND NECK IMPRESSION: 1. Negative CTA for large vessel occlusion or other emergent finding. 2. 70% atheromatous stenosis about the right carotid bulb. 3. Moderate additional moderate atheromatous change about the left carotid bulb without hemodynamically significant greater than 50% stenosis. 4. Severe stenosis about the proximal left V4 segment. 5. Multifocal mild to moderate stenoses about the proximal right V1/V2 segments. 6. Sequelae of recent CABG. Moderate layering left pleural effusion, partially visualized.   Updated recommendations Continue aspirin when okay with cardiothoracic surgery High intensity statin for LDL less than 70 Diabetes management for goal A1c less than 7 Outpatient cardiac monitor Follow-up with vascular surgery for monitoring of the carotid stenosis.  Distribution of strokes looks cardioembolic versus periprocedural and does not appear to be large vessel etiology. Follow-up with [facility] neurology stroke clinic nurse practitioner in 8 to 12 weeks. Plan discussed with Dr. Natale Milch  -- Milon Dikes, MD Neurologist [facility] Pager: [phone]

## 2023-01-04 NOTE — Plan of Care (Signed)

## 2023-01-04 NOTE — Progress Notes (Signed)
      [address]       [place] [address]             [phone]                 3 Days Post-Op Procedure(s) (LRB): CORONARY ARTERY BYPASS GRAFTING (CABG) TIMES THREE USING THE LEFT INTERNAL MAMMARY ARTERY (LIMA) AND ENDOSCOPICALLY HARVESTED RIGHT GREATER SAPHENOUS VEIN (N/A) TRANSESOPHAGEAL ECHOCARDIOGRAM (N/A)   Events: none _______________________________________________________________ Vitals: BP 108/68   Pulse 73   Temp 98.2 F (36.8 C) (Oral)   Resp 17   Ht 5\' 4"  (1.626 m)   Wt 93.8 kg   SpO2 96%   BMI 35.48 kg/m  Filed Weights   01/02/23 0500 01/03/23 0600 01/04/23 0500  Weight: 97.4 kg 95.4 kg 93.8 kg     - Neuro: alert NAD  - Cardiovascular: sinus  Drips:      - Pulm: EWOB  ABG    Component Value Date/Time   PHART 7.366 01/02/2023 0334   PCO2ART 34.0 01/02/2023 0334   PO2ART 87 01/02/2023 0334   HCO3 19.4 (L) 01/02/2023 0334   TCO2 20 (L) 01/02/2023 0334   ACIDBASEDEF 5.0 (H) 01/02/2023 0334   O2SAT 96 01/02/2023 0334    - Abd: ND - Extremity: warm  .Intake/Output      07/25 0701 07/26 0700 07/26 0701 07/27 0700   P.O. 490    I.V. (mL/kg) 16.4 (0.2)    IV Piggyback 10.3    Total Intake(mL/kg) 516.7 (5.5)    Urine (mL/kg/hr) 1520 (0.7)    Stool 0    Chest Tube     Total Output 1520    Net -1003.3         Urine Occurrence 2 x    Stool Occurrence 3 x       _______________________________________________________________ Labs:    Latest Ref Rng & Units 01/04/2023   12:20 AM 01/03/2023    4:05 AM 01/02/2023    3:31 PM  CBC  WBC 4.0 - 10.5 K/uL 10.8  10.9  16.1   Hemoglobin 12.0 - 15.0 g/dL 30.1  9.6  60.1   Hematocrit 36.0 - 46.0 % 30.9  29.2  31.1   Platelets 150 - 400 K/uL 141  111  125       Latest Ref Rng & Units 01/04/2023   12:20 AM 01/03/2023    4:05 AM 01/02/2023    3:31 PM  CMP  Glucose 70 - 99 mg/dL 093  235  573   BUN 8 - 23 mg/dL 10  9  12    Creatinine 0.44 - 1.00 mg/dL 2.20  2.54  2.70    Sodium 135 - 145 mmol/L 134  136  133   Potassium 3.5 - 5.1 mmol/L 3.6  4.1  3.6   Chloride 98 - 111 mmol/L 105  107  108   CO2 22 - 32 mmol/L 22  22  19    Calcium 8.9 - 10.3 mg/dL 8.5  8.3  7.7     CXR: PV congestion  _______________________________________________________________  Assessment and Plan: POD 3 s/p CABG  Neuro: pain controlled CV: A/S/BB at low dose.    Pulm: IS, ambulation Renal: creat stable.   Will diurese GI: on diet Heme: stable ID: afebrile Endo: awaiting floor bed   Tyan Dy O Zaara Sprowl 01/04/2023 8:31 AM

## 2023-01-04 NOTE — TOC Progression Note (Signed)
Transition of Care [facility]) - Progression Note    Patient Details  Name: [name] MRN: [id number] Date of Birth: 03/30/1961  [facility]) CM/SW Contact  Lockie Pares, RN Phone Number: 01/04/2023, 1:04 PM  Clinical Narrative:     Patient admitted for hypotension chest pain. Underwent cath and subsequent CABG x3. Is now out of the ICU and ambulating well. Will follow for any needs, recommendations, and transitions of care       Expected Discharge Plan and Services    Home self care                                           Social Determinants of Health (SDOH) Interventions SDOH Screenings   Food Insecurity: No Food Insecurity (12/28/2022)  Recent Concern: Food Insecurity - Food Insecurity Present (11/23/2022)  Housing: Low Risk  (12/28/2022)  Transportation Needs: No Transportation Needs (12/28/2022)  Utilities: Not At Risk (12/28/2022)  Alcohol Screen: Low Risk  (11/23/2022)  Depression (PHQ2-9): Low Risk  (11/26/2022)  Financial Resource Strain: Low Risk  (12/24/2022)   Received from [facility]  Recent Concern: Financial Resource Strain - High Risk (11/23/2022)  Physical Activity: Insufficiently Active (11/23/2022)  Social Connections: Moderately Integrated (11/23/2022)  Stress: No Stress Concern Present (11/23/2022)  Tobacco Use: Low Risk  (12/27/2022)    Readmission Risk Interventions     No data to display

## 2023-01-04 NOTE — Progress Notes (Signed)
NAME:  [name], MRN:  [id number], DOB:  12/15/1960, LOS: 8 ADMISSION DATE:  12/27/2022, CONSULTATION DATE: 01/01/2023 REFERRING MD: Dr. Cliffton Asters, CHIEF COMPLAINT: S/p CABG  History of Present Illness:  62 year old female with diabetes, hypertension and hyperlipidemia who initially presented with chest pain, was admitted with acute NSTEMI, underwent cardiac cath which showed multivessel coronary artery disease, today she underwent CABG x 3.  Remained intubated, PCCM was consulted for help evaluation medical management EBL 1600 Received 1 unit PRBC and 300 Cell Saver  Pertinent  Medical History   Past Medical History:  Diagnosis Date   Abdominal discomfort    Chronic abdominal and pelvic pain resulting in significant loss of time from work   Anxiety    with depression   Asthma    Chest pain    Depression    Diabetes mellitus    Drug overdose 06/12/2007   60 Naprosyn, psychiatric admission   Hyperlipidemia    Severe   Hypertension    Migraines    Tremor of both hands    on propranolol     Significant Hospital Events: Including procedures, antibiotic start and stop dates in addition to other pertinent events   7/23 CABG  7/24 extubated and awake, remains on dobutamine  7/25 continuing to make progress  7/26 awaiting floor bed, CTA head and neck without acute CVA, noted R carotid stenosis  Interim History / Subjective:  Continues to feel stronger, able to ambulate halls this morning, tolerating diet, no complaints   Objective   Blood pressure 108/68, pulse 73, temperature 98.2 F (36.8 C), temperature source Oral, resp. rate 17, height 5\' 4"  (1.626 m), weight 93.8 kg, SpO2 96%.        Intake/Output Summary (Last 24 hours) at 01/04/2023 0829 Last data filed at 01/04/2023 0430 Gross per 24 hour  Intake 500 ml  Output 1500 ml  Net -1000 ml   Filed Weights   01/02/23 0500 01/03/23 0600 01/04/23 0500  Weight: 97.4 kg 95.4 kg 93.8 kg    General:   chronically and acutely ill-appearing F in no acute distress HEENT: MM pink/moist Neuro: alert and oriented  CV: s1s2 rrr, no m/r/g PULM:  chest and mediastinal tubes in place, clear bilaterally on 3L [place] GI: soft, non-tender  Extremities: warm/dry, 1+ edema  Skin: no rashes or lesions  Labs: Na 134 K 3.6 WBC 10.8 Platelets 141  Resolved Hospital Problem list   Acute kidney injury  Assessment & Plan:   Coronary artery disease s/p CABG x 3 Progressing, ambulating and stable -management per TCTS, continue statin and bb - transfer out of ICU once a bed is available  -IS and ambulate as able -multimodal pain control -allergic to Jonne Ply -in the setting of stenosis on [facility] Dr. Natale Milch to discuss possibility of plavix with neurology  Acute respiratory insufficiency, postop -resolved, stable   Hypertension -continue holding home Lisinopril and Lotensin for now  Hyperlipidemia -Continue atorvastatin  Diabetes type 2 hemoglobin A1c is 8.6 -SSI, started Semglee 10 units at bedtime -hold home metformin   Expected perioperative blood loss anemia Thrombocytopenia due to CPB S/p 1 unit of PRBC -stable, continue to trend    Concern for scattered embolic strokes  MRI done for transient R-sided numbness and weakness on 7/22, post-procedure -repeat CTH without acute stroke but significant for 70% atheromatous stenosis of the R carotid bulb, severe stenosis L V4 segment and mild to moderate stnosis of the proximal R V1/V2 segments   Best Practice (  right click and "Reselect all SmartList Selections" daily)   Diet/type: Regular consistency (see orders) DVT prophylaxis: SCD GI prophylaxis: PPI Lines: N/A Foley:  Yes, and it is still needed Code Status:  full code Last date of multidisciplinary goals of care discussion [Per primary team]  Labs   CBC: Recent Labs  Lab 01/01/23 2200 01/02/23 0157 01/02/23 0334 01/02/23 0401 01/02/23 1531 01/03/23 0405 01/04/23 0020  WBC  19.4*  --   --  16.2* 16.1* 10.9* 10.8*  HGB 11.7*   < > 10.5* 11.0* 10.6* 9.6* 10.1*  HCT 34.2*   < > 31.0* 31.3* 31.1* 29.2* 30.9*  MCV 87.0  --   --  87.9 90.1 91.0 92.2  PLT 156  --   --  138* 125* 111* 141*   < > = values in this interval not displayed.    Basic Metabolic Panel: Recent Labs  Lab 12/29/22 0516 12/30/22 0407 12/31/22 0403 01/01/23 2200 01/02/23 0157 01/02/23 0334 01/02/23 0401 01/02/23 1531 01/03/23 0405 01/04/23 0020  NA 137 135   < > 137   < > 144 139 133* 136 134*  K 4.1 3.7   < > 4.5   < > 3.4* 3.3* 3.6 4.1 3.6  CL 107 105   < > 110  --   --  109 108 107 105  CO2 22 18*   < > 20*  --   --  22 19* 22 22  GLUCOSE 213* 210*   < > 190*  --   --  150* 234* 196* 171*  BUN 12 15   < > 16  --   --  14 12 9 10   CREATININE 0.88 0.95   < > 0.91  --   --  0.87 0.85 0.83 0.89  CALCIUM 9.0 9.0   < > 7.7*  --   --  8.1* 7.7* 8.3* 8.5*  MG 1.3* 1.9  --  2.9*  --   --  2.2 1.9  --   --    < > = values in this interval not displayed.   GFR: Estimated Creatinine Clearance: 72.7 mL/min (by C-G formula based on SCr of 0.89 mg/dL). Recent Labs  Lab 01/02/23 0401 01/02/23 1531 01/03/23 0405 01/04/23 0020  WBC 16.2* 16.1* 10.9* 10.8*    Liver Function Tests: Recent Labs  Lab 12/29/22 0516  AST 19  ALT 23  ALKPHOS 41  BILITOT 0.4  PROT 5.8*  ALBUMIN 3.3*   No results for input(s): "LIPASE", "AMYLASE" in the last 168 hours. No results for input(s): "AMMONIA" in the last 168 hours.  ABG    Component Value Date/Time   PHART 7.366 01/02/2023 0334   PCO2ART 34.0 01/02/2023 0334   PO2ART 87 01/02/2023 0334   HCO3 19.4 (L) 01/02/2023 0334   TCO2 20 (L) 01/02/2023 0334   ACIDBASEDEF 5.0 (H) 01/02/2023 0334   O2SAT 96 01/02/2023 0334     Coagulation Profile: Recent Labs  Lab 12/31/22 2034 01/01/23 1729  INR 1.0 1.3*    Cardiac Enzymes: No results for input(s): "CKTOTAL", "CKMB", "CKMBINDEX", "TROPONINI" in the last 168 hours.  HbA1C: HB A1C  (BAYER DCA - WAIVED)  Date/Time Value Ref Range Status  11/26/2022 04:17 PM 9.1 (H) 4.8 - 5.6 % Final    Comment:             Prediabetes: 5.7 - 6.4          Diabetes: >6.4  Glycemic control for adults with diabetes: <7.0   08/21/2022 03:46 PM 9.2 (H) 4.8 - 5.6 % Final    Comment:             Prediabetes: 5.7 - 6.4          Diabetes: >6.4          Glycemic control for adults with diabetes: <7.0    Hgb A1c MFr Bld  Date/Time Value Ref Range Status  12/31/2022 04:11 AM 8.6 (H) 4.8 - 5.6 % Final    Comment:    (NOTE) Pre diabetes:          5.7%-6.4%  Diabetes:              >6.4%  Glycemic control for   <7.0% adults with diabetes     CBG: Recent Labs  Lab 01/03/23 1629 01/03/23 2012 01/04/23 0006 01/04/23 0402 01/04/23 0750  GLUCAP 204* 214* 168* 170* 226*    Review of Systems:   Unable to obtain as patient is intubated and sedated  Past Medical History:  She,  has a past medical history of Abdominal discomfort, Anxiety, Asthma, Chest pain, Depression, Diabetes mellitus, Drug overdose (06/12/2007), Hyperlipidemia, Hypertension, Migraines, and Tremor of both hands.   Surgical History:   Past Surgical History:  Procedure Laterality Date   ABDOMINAL HYSTERECTOMY  02/10/2007   + lysis of adhesions   APPENDECTOMY  1990s   ruptured, late 90s   BILATERAL OOPHORECTOMY      in 2 surgeries prior to 9/08   CARPAL TUNNEL RELEASE Right 08/16/2021   Procedure: RIGHT CARPAL TUNNEL RELEASE;  Surgeon: Marlyne Beards, MD;  Location: [facility];  Service: Orthopedics;  Laterality: Right;   COLONOSCOPY  02/09/2010    normal upper endoscopy, single colonic polyp,   CORONARY ARTERY BYPASS GRAFT N/A 01/01/2023   Procedure: CORONARY ARTERY BYPASS GRAFTING (CABG) TIMES THREE USING THE LEFT INTERNAL MAMMARY ARTERY (LIMA) AND ENDOSCOPICALLY HARVESTED RIGHT GREATER SAPHENOUS VEIN;  Surgeon: Corliss Skains, MD;  Location: [facility] OR;  Service: Open Heart Surgery;   Laterality: N/A;   HERNIA REPAIR     LEFT HEART CATH AND CORONARY ANGIOGRAPHY N/A 12/28/2022   Procedure: LEFT HEART CATH AND CORONARY ANGIOGRAPHY;  Surgeon: [name], MD;  Location: [facility] INVASIVE CV LAB;  Service: Cardiovascular;  Laterality: N/A;   TEE WITHOUT CARDIOVERSION N/A 01/01/2023   Procedure: TRANSESOPHAGEAL ECHOCARDIOGRAM;  Surgeon: Corliss Skains, MD;  Location: [facility] OR;  Service: Open Heart Surgery;  Laterality: N/A;   VENTRAL HERNIA REPAIR     x3     Social History:   reports that she has never smoked. She has never used smokeless tobacco. She reports that she does not drink alcohol and does not use drugs.   Family History:  Her family history is not on file.   Allergies Allergies  Allergen Reactions   Aspirin Anaphylaxis   Bee Venom Anaphylaxis   Benadryl [Diphenhydramine Hcl] Anaphylaxis   Morphine And Codeine Shortness Of Breath and Itching   Vicks Formula 44 Cough-Cold Pm [Dm-Apap-Cpm] Shortness Of Breath and Rash    Not anaphylaxis     Home Medications  Prior to Admission medications   Medication Sig Start Date End Date Taking? Authorizing Provider  albuterol (VENTOLIN HFA) 108 (90 Base) MCG/ACT inhaler Inhale 2 puffs into the lungs every 4 (four) hours as needed for wheezing or shortness of breath. (NEEDS TO BE SEEN) 03/30/20  Yes Dettinger, Elige Radon, MD  atorvastatin (LIPITOR) 40  MG tablet Take 40 mg by mouth daily. 12/10/22  Yes [provider]  benazepril (LOTENSIN) 20 MG tablet Take 1 tablet (20 mg total) by mouth daily. 11/26/22  Yes Martin, Mary-Margaret, FNP  buPROPion (WELLBUTRIN XL) 150 MG 24 hr tablet TAKE 3 TABLETS BY MOUTH DAILY. 12/10/22  Yes Martin, Mary-Margaret, FNP  EPINEPHrine 0.3 mg/0.3 mL IJ SOAJ injection Inject 0.3 mg into the muscle as needed for anaphylaxis. 11/22/20  Yes Daphine Deutscher, Mary-Margaret, FNP  fenofibrate 160 MG tablet Take 1 tablet (160 mg total) by mouth daily. 11/26/22  Yes Martin, Mary-Margaret, FNP  glipiZIDE  (GLUCOTROL XL) 10 MG 24 hr tablet Take 1 tablet (10 mg total) by mouth daily. 11/26/22  Yes Martin, Mary-Margaret, FNP  lisinopril (ZESTRIL) 10 MG tablet Take 1 tablet (10 mg total) by mouth daily. 11/26/22  Yes Daphine Deutscher, Mary-Margaret, FNP  metFORMIN (GLUCOPHAGE) 500 MG tablet Take 1 tablet (500 mg total) by mouth 2 (two) times daily with a meal. 11/26/22  Yes Daphine Deutscher, Mary-Margaret, FNP  propranolol (INDERAL) 40 MG tablet Take 1 tablet (40 mg total) by mouth 2 (two) times daily. (Needs to be seen before next refill) 11/26/22  Yes Daphine Deutscher, Mary-Margaret, FNP  rizatriptan (MAXALT) 10 MG tablet Take 1 tablet (10 mg total) by mouth as needed for migraine. May repeat in 2 hours if needed 07/23/22  Yes Daphine Deutscher, Mary-Margaret, FNP  RYBELSUS 7 MG TABS TAKE 1 TABLET (7 MG TOTAL) BY MOUTH DAILY 08/23/22  Yes Daphine Deutscher, Mary-Margaret, FNP  topiramate (TOPAMAX) 50 MG tablet Take 1 tablet (50 mg total) by mouth 2 (two) times daily. 11/26/22  Yes Bennie Pierini, FNP     Critical care time:       Darcella Gasman Fedra Lanter, PA-C [facility] See Amion for pager If no response to pager , please call [phone] until 7pm After 7:00 pm call Elink  [phone]

## 2023-01-04 NOTE — Progress Notes (Signed)
PROGRESS NOTE  [name]:[id number] DOB: Oct 21, 1960 DOA: 12/27/2022 PCP: Bennie Pierini, FNP   LOS: 8 days   Brief Narrative / Interim history: 62 y.o. female with medical history significant for T2DM, HTN, HLD, depression/anxiety who presented to the ED for evaluation of chest pain and near syncopal episode. Patient states that for the last week she has been having intermittent chest pain.  This is described as pressure-like discomfort across her chest, to her back, and down her left arm.  This is worse with exertion.  She says normally walks her dogs 1.5 miles however since symptoms started she can barely walk a block before her symptoms occur.  Symptoms are associated with shortness of breath.  She has had the symptoms occasionally while at rest as well. She also reports nausea. She was seen by [facility] cardiology 12/24/2022 who recommended outpatient stress testing and echocardiogram, not yet performed. On the day of admission she was at a restaurant with friends when she suddenly became diaphoretic and nauseous while she was sitting down.   Subjective / 24h Interval events: No acute issues or events overnight, denies nausea vomiting diarrhea constipation headache fevers chills shortness of breath or chest pain  Assesement and Plan: Principal Problem:   Chest pain Active Problems:   Hypotension   AKI (acute kidney injury) (HCC)   Type 2 diabetes mellitus (HCC)   Hyperlipidemia associated with type 2 diabetes mellitus (HCC)   Non-ST elevation (NSTEMI) myocardial infarction [facility])   Coronary artery disease due to lipid rich plaque   Pure hypercholesterolemia   S/P CABG x 3  Chest pain in the setting of unstable angina, severe multivessel coronary artery disease -Cardiology following -Cardiac cath 7/19 shows severe multivessel disease LVEDP 13 mmHg -Three-vessel CABG 7/23 -initially required dobutamine overnight now discontinued -Asa allergy with anaphylaxis reported  -will start on Plavix as below once cleared by CT surgery  Acute ischemic non-hemorrhagic punctate infarcts of the L occiput; R cerebellum -Initial event evening 7/22, symptoms resolved overnight with no repeat episodes  -MRI does confirm acute ischemic nonhemorrhagic punctate infarct -Given patient's recent surgery requiring multiple medications and ICU coverage we will hold off on any further stroke workup at this time until more stabilized -Neuro following, will initiate Plavix once cleared by CT surgery given her anaphylactic reaction to aspirin.  Leukocytosis, likely reactive, resolving -Secondary to recent procedure, no signs or symptoms of infection, continue to follow closely - downtrending appropriatlely  Hypotension with history of hypertension -Questionably secondary to above, resolving - off dobutamine 7/25 -Diastolic blood pressure still elevated today, diuresing prior CT surgery   Acute kidney injury, resolved -Likely secondary to initial hypotension as above -Follow now that diuretics have been resumed   Type 2 diabetes, uncontrolled, with hyperglycemia  -Uncontrolled: A1c 9.1 -Continue sliding scale insulin, hypoglycemic protocol diabetic diet -Hold home glycemic medications -Advance diet as tolerated back to diabetic diet   Hyperlipidemia - Continue atorvastatin. Lipid panel shows poorly controled profile   Depression/anxiety - Continue Wellbutrin. Code status: Full code lengthy discussion with patient and friend at bedside, it appears patient was unclear about what full code versus DNR meant.  Recommend follow-up with MOST form as her wishes are essentially not to be maintained in a vegetative state if recovery is unclear/not expected, but otherwise continue all medical care at full code.  Family Communication: no family at bedside, offered to call Sister -patient to update  Status is: Inpatient Remains inpatient appropriate because: severity of illness Level of  care: Progressive  Consultants:  Cardiology, cardiothoracic surgery, PCCM  Objective: Vitals:   01/04/23 0325 01/04/23 0400 01/04/23 0500 01/04/23 0600  BP: (!) 110/52 (!) 77/53  108/68  Pulse: 71 70  73  Resp: 13 14  17   Temp:      TempSrc:      SpO2: 96% 96%  96%  Weight:   93.8 kg   Height:        Intake/Output Summary (Last 24 hours) at 01/04/2023 0716 Last data filed at 01/04/2023 0430 Gross per 24 hour  Intake 516.7 ml  Output 1500 ml  Net -983.3 ml   Wt Readings from Last 3 Encounters:  01/04/23 93.8 kg  11/26/22 93 kg  08/21/22 91.2 kg    Examination:  Constitutional: NAD Eyes: no scleral icterus ENMT: Mucous membranes are moist.  Neck: normal, supple Respiratory: clear to auscultation bilaterally, no wheezing, no crackles.  Cardiovascular: Sternotomy site clean dry intact Abdomen: non distended, no tenderness. Bowel sounds positive.  Extremities: Without overt edema/focal neurodeficits  Data Reviewed: I have independently reviewed following labs and imaging studies   CBC Recent Labs  Lab 01/01/23 2200 01/02/23 0157 01/02/23 0334 01/02/23 0401 01/02/23 1531 01/03/23 0405 01/04/23 0020  WBC 19.4*  --   --  16.2* 16.1* 10.9* 10.8*  HGB 11.7*   < > 10.5* 11.0* 10.6* 9.6* 10.1*  HCT 34.2*   < > 31.0* 31.3* 31.1* 29.2* 30.9*  PLT 156  --   --  138* 125* 111* 141*  MCV 87.0  --   --  87.9 90.1 91.0 92.2  MCH 29.8  --   --  30.9 30.7 29.9 30.1  MCHC 34.2  --   --  35.1 34.1 32.9 32.7  RDW 13.2  --   --  13.5 14.2 14.4 14.0   < > = values in this interval not displayed.    Recent Labs  Lab 12/29/22 0516 12/30/22 0407 12/31/22 0403 12/31/22 0411 12/31/22 2034 01/01/23 0005 01/01/23 1729 01/01/23 1914 01/01/23 2200 01/02/23 0157 01/02/23 0334 01/02/23 0401 01/02/23 1531 01/03/23 0405 01/04/23 0020  NA 137 135   < >  --   --    < >  --    < > 137   < > 144 139 133* 136 134*  K 4.1 3.7   < >  --   --    < >  --    < > 4.5   < > 3.4* 3.3*  3.6 4.1 3.6  CL 107 105   < >  --   --    < >  --   --  110  --   --  109 108 107 105  CO2 22 18*   < >  --   --    < >  --   --  20*  --   --  22 19* 22 22  GLUCOSE 213* 210*   < >  --   --    < >  --   --  190*  --   --  150* 234* 196* 171*  BUN 12 15   < >  --   --    < >  --   --  16  --   --  14 12 9 10   CREATININE 0.88 0.95   < >  --   --    < >  --   --  0.91  --   --  0.87 0.85 0.83 0.89  CALCIUM 9.0 9.0   < >  --   --    < >  --   --  7.7*  --   --  8.1* 7.7* 8.3* 8.5*  AST 19  --   --   --   --   --   --   --   --   --   --   --   --   --   --   ALT 23  --   --   --   --   --   --   --   --   --   --   --   --   --   --   ALKPHOS 41  --   --   --   --   --   --   --   --   --   --   --   --   --   --   BILITOT 0.4  --   --   --   --   --   --   --   --   --   --   --   --   --   --   ALBUMIN 3.3*  --   --   --   --   --   --   --   --   --   --   --   --   --   --   MG 1.3* 1.9  --   --   --   --   --   --  2.9*  --   --  2.2 1.9  --   --   INR  --   --   --   --  1.0  --  1.3*  --   --   --   --   --   --   --   --   HGBA1C  --   --   --  8.6*  --   --   --   --   --   --   --   --   --   --   --    < > = values in this interval not displayed.    Lab Results  Component Value Date   HGBA1C 8.6 (H) 12/31/2022      Component Value Date/Time   BNP 46.8 12/27/2022 1618    CBG: Recent Labs  Lab 01/03/23 1122 01/03/23 1629 01/03/23 2012 01/04/23 0006 01/04/23 0402  GLUCAP 195* 204* 214* 168* 170*    Recent Results (from the past 240 hour(s))  SARS Coronavirus 2 by RT PCR (hospital order, performed in [facility] hospital lab) *cepheid single result test* Anterior Nasal Swab     Status: None   Collection Time: 12/27/22  4:18 PM   Specimen: Anterior Nasal Swab  Result Value Ref Range Status   SARS Coronavirus 2 by RT PCR NEGATIVE NEGATIVE Final    Comment: (NOTE) SARS-CoV-2 target nucleic acids are NOT DETECTED.  The SARS-CoV-2 RNA is generally detectable in upper and  lower respiratory specimens during the acute phase of infection. The lowest concentration of SARS-CoV-2 viral copies this assay can detect is 250 copies / mL. A negative result does not preclude SARS-CoV-2 infection and should not be used as the sole basis for treatment or other patient management decisions.  A negative result may  occur with improper specimen collection / handling, submission of specimen other than nasopharyngeal swab, presence of viral mutation(s) within the areas targeted by this assay, and inadequate number of viral copies (<250 copies / mL). A negative result must be combined with clinical observations, patient history, and epidemiological information.  Fact Sheet for Patients:   RoadLapTop.co.za  Fact Sheet for Healthcare Providers: http://kim-miller.com/  This test is not yet approved or  cleared by the [place] FDA and has been authorized for detection and/or diagnosis of SARS-CoV-2 by FDA under an Emergency Use Authorization (EUA).  This EUA will remain in effect (meaning this test can be used) for the duration of the COVID-19 declaration under Section 564(b)(1) of the Act, 21 U.S.C. section 360bbb-3(b)(1), unless the authorization is terminated or revoked sooner.  Performed at [facility], [address], [place], [place] [address]   Surgical pcr screen     Status: None   Collection Time: 12/31/22 11:22 PM   Specimen: Nasal Mucosa; Nasal Swab  Result Value Ref Range Status   MRSA, PCR NEGATIVE NEGATIVE Final   Staphylococcus aureus NEGATIVE NEGATIVE Final    Comment: (NOTE) The Xpert SA Assay (FDA approved for NASAL specimens in patients 69 years of age and older), is one component of a comprehensive surveillance program. It is not intended to diagnose infection nor to guide or monitor treatment. Performed at [facility], [address], [place], [place] [address]       Radiology Studies: CT ANGIO HEAD NECK W WO CM  Result Date: 01/03/2023 CLINICAL DATA:  Follow-up examination for stroke. EXAM: CT ANGIOGRAPHY HEAD AND NECK WITH AND WITHOUT CONTRAST TECHNIQUE: Multidetector CT imaging of the head and neck was performed using the standard protocol during bolus administration of intravenous contrast. Multiplanar CT image reconstructions and MIPs were obtained to evaluate the vascular anatomy. Carotid stenosis measurements (when applicable) are obtained utilizing NASCET criteria, using the distal internal carotid diameter as the denominator. RADIATION DOSE REDUCTION: This exam was performed according to the departmental dose-optimization program which includes automated exposure control, adjustment of the mA and/or kV according to patient size and/or use of iterative reconstruction technique. CONTRAST:  75mL OMNIPAQUE IOHEXOL 350 MG/ML SOLN COMPARISON:  MRI from 12/31/2022. FINDINGS: CT HEAD FINDINGS Brain: Cerebral volume within normal limits. Previously identified punctate ischemic infarcts not visible by CT. No other acute large vessel territory infarct. No acute intracranial hemorrhage. No mass lesion or midline shift. No hydrocephalus or extra-axial fluid collection. Vascular: No abnormal hyperdense vessel. Skull: Scalp soft tissues and calvarium demonstrate no acute finding. Sinuses/Orbits: Globes and orbital soft tissues demonstrate no acute finding. Paranasal sinuses and mastoid air cells are clear. Other: None. Review of the MIP images confirms the above findings CTA NECK FINDINGS Aortic arch: Visualized aortic arch normal caliber with standard 3 vessel morphology. Sequelae of recent CABG noted. No stenosis about the origin the great vessels. Right carotid system: Right common and internal carotid arteries are patent without dissection. Atheromatous change about the right carotid bulb with up to 70% stenosis by NASCET criteria. Left carotid system: Left common and  internal carotid arteries are patent without dissection. Moderate atheromatous change about the left carotid bulb without hemodynamically significant greater than 50% stenosis. Vertebral arteries: Both vertebral arteries arise from subclavian arteries. No significant proximal subclavian artery stenosis. Multifocal atheromatous plaque within the proximal right V1/V2 segments with associated mild to moderate stenoses. Vertebral arteries are otherwise patent without stenosis or dissection. Skeleton: No discrete or  worrisome osseous lesions. Other neck: No other acute finding. Upper chest: Layering left pleural effusion, partially visualized. Changes of recent median sternotomy with residual postoperative gas within the anterior upper chest wall. Review of the MIP images confirms the above findings CTA HEAD FINDINGS Anterior circulation: Atheromatous change about the right carotid siphon with associated mild stenosis. Left ICA widely patent to the terminus without stenosis. A1 segments patent bilaterally. Normal anterior communicating complex. Anterior cerebral arteries widely patent. No M1 stenosis or occlusion. No proximal MCA branch occlusion or high-grade stenosis. Distal MCA branches perfused and symmetric. Small vessel atheromatous irregularity noted. Posterior circulation: Right V4 segment widely patent. Atheromatous plaque about the proximal left V4 segment with associated severe stenosis (series 16, image 369). Left PICA grossly patent at its origin. Right PICA patent. Basilar patent without stenosis. Superior cerebellar and posterior cerebral arteries patent bilaterally. Distal small vessel atheromatous irregularity. Venous sinuses: Grossly patent allowing for timing the contrast bolus. Anatomic variants: None significant.  No aneurysm. Review of the MIP images confirms the above findings IMPRESSION: CT HEAD IMPRESSION: Negative CT for acute intracranial abnormality. Previously identified punctate ischemic  infarcts not visible by CT. CTA HEAD AND NECK IMPRESSION: 1. Negative CTA for large vessel occlusion or other emergent finding. 2. 70% atheromatous stenosis about the right carotid bulb. 3. Moderate additional moderate atheromatous change about the left carotid bulb without hemodynamically significant greater than 50% stenosis. 4. Severe stenosis about the proximal left V4 segment. 5. Multifocal mild to moderate stenoses about the proximal right V1/V2 segments. 6. Sequelae of recent CABG. Moderate layering left pleural effusion, partially visualized. Electronically Signed   By: Rise Mu M.D.   On: 01/03/2023 22:45    Carma Leaven DO [facility]  Between 7 pm - 7 am I am not available, please contact night coverage MD/APP via Amion

## 2023-01-05 DIAGNOSIS — E1142 Type 2 diabetes mellitus with diabetic polyneuropathy: Secondary | ICD-10-CM | POA: Diagnosis not present

## 2023-01-05 DIAGNOSIS — Z951 Presence of aortocoronary bypass graft: Secondary | ICD-10-CM | POA: Diagnosis not present

## 2023-01-05 LAB — GLUCOSE, CAPILLARY
Glucose-Capillary: 131 mg/dL — ABNORMAL HIGH (ref 70–99)
Glucose-Capillary: 243 mg/dL — ABNORMAL HIGH (ref 70–99)
Glucose-Capillary: 169 mg/dL — ABNORMAL HIGH (ref 70–99)
Glucose-Capillary: 163 mg/dL — ABNORMAL HIGH (ref 70–99)
Glucose-Capillary: 195 mg/dL — ABNORMAL HIGH (ref 70–99)

## 2023-01-05 MED ORDER — GLIPIZIDE ER 10 MG PO TB24
10.0000 mg | ORAL_TABLET | Freq: Every day | ORAL | Status: DC
  Administered 2023-01-05 – 2023-01-11 (×7): 10 mg via ORAL
  Filled 2023-01-05 (×8): qty 1

## 2023-01-05 MED ORDER — METFORMIN HCL 500 MG PO TABS
500.0000 mg | ORAL_TABLET | Freq: Two times a day (BID) | ORAL | Status: DC
  Administered 2023-01-05 – 2023-01-11 (×13): 500 mg via ORAL
  Filled 2023-01-05 (×13): qty 1

## 2023-01-05 NOTE — Progress Notes (Addendum)
[address]       [place] [address]             [phone]     4 Days Post-Op Procedure(s) (LRB): CORONARY ARTERY BYPASS GRAFTING (CABG) TIMES THREE USING THE LEFT INTERNAL MAMMARY ARTERY (LIMA) AND ENDOSCOPICALLY HARVESTED RIGHT GREATER SAPHENOUS VEIN (N/A) TRANSESOPHAGEAL ECHOCARDIOGRAM (N/A) Subjective: Feels better, gaining strength  Objective: Vital signs in last 24 hours: Temp:  [98 F (36.7 C)-98.5 F (36.9 C)] 98 F (36.7 C) (07/27 0305) Pulse Rate:  [67-94] 67 (07/27 0305) Cardiac Rhythm: Normal sinus rhythm (07/26 1945) Resp:  [15-25] 17 (07/27 0305) BP: (114-143)/(52-96) 114/52 (07/27 0305) SpO2:  [94 %-100 %] 98 % (07/27 0305) Weight:  [91.9 kg] 91.9 kg (07/27 0305)  Hemodynamic parameters for last 24 hours:    Intake/Output from previous day: 07/26 0701 - 07/27 0700 In: 240 [P.O.:240] Out: -  Intake/Output this shift: No intake/output data recorded.  General appearance: alert, cooperative, and no distress Heart: regular rate and rhythm Lungs: dim left base Abdomen: benign Extremities: minor edema Wound: incis healing well  Lab Results: Recent Labs    01/03/23 0405 01/04/23 0020  WBC 10.9* 10.8*  HGB 9.6* 10.1*  HCT 29.2* 30.9*  PLT 111* 141*   BMET:  Recent Labs    01/03/23 0405 01/04/23 0020  NA 136 134*  K 4.1 3.6  CL 107 105  CO2 22 22  GLUCOSE 196* 171*  BUN 9 10  CREATININE 0.83 0.89  CALCIUM 8.3* 8.5*    PT/INR: No results for input(s): "LABPROT", "INR" in the last 72 hours. ABG    Component Value Date/Time   PHART 7.366 01/02/2023 0334   HCO3 19.4 (L) 01/02/2023 0334   TCO2 20 (L) 01/02/2023 0334   ACIDBASEDEF 5.0 (H) 01/02/2023 0334   O2SAT 96 01/02/2023 0334   CBG (last 3)  Recent Labs    01/04/23 2058 01/04/23 2334 01/05/23 0425  GLUCAP 275* 246* 131*    Meds Scheduled Meds:  acetaminophen  1,000 mg Oral Q6H   Or   acetaminophen (TYLENOL) oral liquid 160 mg/5 mL  1,000 mg Per  Tube Q6H   atorvastatin  80 mg Oral Daily   bisacodyl  10 mg Oral Daily   Or   bisacodyl  10 mg Rectal Daily   Chlorhexidine Gluconate Cloth  6 each Topical Daily   docusate sodium  200 mg Oral BID   enoxaparin (LOVENOX) injection  40 mg Subcutaneous QHS   furosemide  40 mg Oral Daily   insulin aspart  0-24 Units Subcutaneous Q4H   insulin glargine-yfgn  10 Units Subcutaneous QHS   metoprolol tartrate  12.5 mg Oral BID   pantoprazole  40 mg Oral Daily   polyethylene glycol  17 g Oral BID   sodium chloride flush  10-40 mL Intracatheter Q12H   sodium chloride flush  3 mL Intravenous Q12H   sodium chloride flush  3 mL Intravenous Q12H   topiramate  50 mg Oral BID   Continuous Infusions:  sodium chloride Stopped (01/02/23 1207)   sodium chloride     sodium chloride     sodium chloride     albumin human Stopped (01/02/23 0018)   insulin Stopped (01/02/23 1058)   lactated ringers     lactated ringers Stopped (01/02/23 1055)   PRN Meds:.sodium chloride, sodium chloride, albumin human, dextrose, fentaNYL (SUBLIMAZE) injection, metoprolol tartrate, midazolam, ondansetron (ZOFRAN) IV, mouth rinse, oxyCODONE, sodium chloride flush, sodium  chloride flush, sodium chloride flush, traMADol  Xrays CT ANGIO HEAD NECK W WO CM  Result Date: 01/03/2023 CLINICAL DATA:  Follow-up examination for stroke. EXAM: CT ANGIOGRAPHY HEAD AND NECK WITH AND WITHOUT CONTRAST TECHNIQUE: Multidetector CT imaging of the head and neck was performed using the standard protocol during bolus administration of intravenous contrast. Multiplanar CT image reconstructions and MIPs were obtained to evaluate the vascular anatomy. Carotid stenosis measurements (when applicable) are obtained utilizing NASCET criteria, using the distal internal carotid diameter as the denominator. RADIATION DOSE REDUCTION: This exam was performed according to the departmental dose-optimization program which includes automated exposure control,  adjustment of the mA and/or kV according to patient size and/or use of iterative reconstruction technique. CONTRAST:  75mL OMNIPAQUE IOHEXOL 350 MG/ML SOLN COMPARISON:  MRI from 12/31/2022. FINDINGS: CT HEAD FINDINGS Brain: Cerebral volume within normal limits. Previously identified punctate ischemic infarcts not visible by CT. No other acute large vessel territory infarct. No acute intracranial hemorrhage. No mass lesion or midline shift. No hydrocephalus or extra-axial fluid collection. Vascular: No abnormal hyperdense vessel. Skull: Scalp soft tissues and calvarium demonstrate no acute finding. Sinuses/Orbits: Globes and orbital soft tissues demonstrate no acute finding. Paranasal sinuses and mastoid air cells are clear. Other: None. Review of the MIP images confirms the above findings CTA NECK FINDINGS Aortic arch: Visualized aortic arch normal caliber with standard 3 vessel morphology. Sequelae of recent CABG noted. No stenosis about the origin the great vessels. Right carotid system: Right common and internal carotid arteries are patent without dissection. Atheromatous change about the right carotid bulb with up to 70% stenosis by NASCET criteria. Left carotid system: Left common and internal carotid arteries are patent without dissection. Moderate atheromatous change about the left carotid bulb without hemodynamically significant greater than 50% stenosis. Vertebral arteries: Both vertebral arteries arise from subclavian arteries. No significant proximal subclavian artery stenosis. Multifocal atheromatous plaque within the proximal right V1/V2 segments with associated mild to moderate stenoses. Vertebral arteries are otherwise patent without stenosis or dissection. Skeleton: No discrete or worrisome osseous lesions. Other neck: No other acute finding. Upper chest: Layering left pleural effusion, partially visualized. Changes of recent median sternotomy with residual postoperative gas within the anterior upper  chest wall. Review of the MIP images confirms the above findings CTA HEAD FINDINGS Anterior circulation: Atheromatous change about the right carotid siphon with associated mild stenosis. Left ICA widely patent to the terminus without stenosis. A1 segments patent bilaterally. Normal anterior communicating complex. Anterior cerebral arteries widely patent. No M1 stenosis or occlusion. No proximal MCA branch occlusion or high-grade stenosis. Distal MCA branches perfused and symmetric. Small vessel atheromatous irregularity noted. Posterior circulation: Right V4 segment widely patent. Atheromatous plaque about the proximal left V4 segment with associated severe stenosis (series 16, image 369). Left PICA grossly patent at its origin. Right PICA patent. Basilar patent without stenosis. Superior cerebellar and posterior cerebral arteries patent bilaterally. Distal small vessel atheromatous irregularity. Venous sinuses: Grossly patent allowing for timing the contrast bolus. Anatomic variants: None significant.  No aneurysm. Review of the MIP images confirms the above findings IMPRESSION: CT HEAD IMPRESSION: Negative CT for acute intracranial abnormality. Previously identified punctate ischemic infarcts not visible by CT. CTA HEAD AND NECK IMPRESSION: 1. Negative CTA for large vessel occlusion or other emergent finding. 2. 70% atheromatous stenosis about the right carotid bulb. 3. Moderate additional moderate atheromatous change about the left carotid bulb without hemodynamically significant greater than 50% stenosis. 4. Severe stenosis about the proximal left V4  segment. 5. Multifocal mild to moderate stenoses about the proximal right V1/V2 segments. 6. Sequelae of recent CABG. Moderate layering left pleural effusion, partially visualized. Electronically Signed   By: Rise Mu M.D.   On: 01/03/2023 22:45    Assessment/Plan: S/P Procedure(s) (LRB): CORONARY ARTERY BYPASS GRAFTING (CABG) TIMES THREE USING THE  LEFT INTERNAL MAMMARY ARTERY (LIMA) AND ENDOSCOPICALLY HARVESTED RIGHT GREATER SAPHENOUS VEIN (N/A) TRANSESOPHAGEAL ECHOCARDIOGRAM (N/A)  POD#4  1 afeb, S BP 114-143, sinus rhythm with some episodes od brady/heart block- will stop beta blocker 2 O2 sats good on RA 3 frequent voids- not measured, weight uo approx 1 Kg>preop, normal renal fxn, cont diuretic for now 4 BS 131-275 range- poor control at home- restart po meds 5 expected ABLA- stable 6 thrombocytopenia trend improved 7 TOC consult - lives alone, see what she qualifies for in terms of ST SNF 8 pulm hygiene and cardiac rehab    LOS: 9 days    Rowe Clack 01/05/2023  patient examined and medical record reviewed,agree with above note. Lovett Sox 01/05/2023

## 2023-01-05 NOTE — Progress Notes (Signed)
PROGRESS NOTE  [name] ZOX:[id number] DOB: August 25, 1960 DOA: 12/27/2022 PCP: Bennie Pierini, FNP   LOS: 9 days   Brief Narrative / Interim history: 62 y.o. female with medical history significant for T2DM, HTN, HLD, depression/anxiety who presented to the ED for evaluation of chest pain and near syncopal episode. Patient states that for the last week she has been having intermittent chest pain.  This is described as pressure-like discomfort across her chest, to her back, and down her left arm.  This is worse with exertion.  She says normally walks her dogs 1.5 miles however since symptoms started she can barely walk a block before her symptoms occur.  Symptoms are associated with shortness of breath.  She has had the symptoms occasionally while at rest as well. She also reports nausea. She was seen by [facility] cardiology 12/24/2022 who recommended outpatient stress testing and echocardiogram, not yet performed. On the day of admission she was at a restaurant with friends when she suddenly became diaphoretic and nauseous while she was sitting down.   Subjective / 24h Interval events: No acute issues or events overnight, denies nausea vomiting diarrhea constipation headache fevers chills shortness of breath or chest pain  Assesement and Plan: Principal Problem:   Chest pain Active Problems:   Hypotension   AKI (acute kidney injury) (HCC)   Type 2 diabetes mellitus (HCC)   Hyperlipidemia associated with type 2 diabetes mellitus (HCC)   Non-ST elevation (NSTEMI) myocardial infarction [facility])   Coronary artery disease due to lipid rich plaque   Pure hypercholesterolemia   S/P CABG x 3  Chest pain in the setting of unstable angina, severe multivessel coronary artery disease -Cardiology following -Cardiac cath 7/19 shows severe multivessel disease LVEDP 13 mmHg -Three-vessel CABG 7/23 -initially required dobutamine overnight now discontinued -Asa allergy with anaphylaxis reported -  will start on Plavix as below once cleared by CT surgery  Acute ischemic non-hemorrhagic punctate infarcts of the L occiput; R cerebellum -Initial event evening 7/22, symptoms resolved overnight with no repeat episodes  -MRI does confirm acute ischemic nonhemorrhagic punctate infarct -Given patient's recent surgery requiring multiple medications and ICU coverage we will hold off on any further stroke workup at this time until more stabilized -Neuro following, will initiate Plavix once cleared by CT surgery (anaphylactic allergy to aspirin).  Leukocytosis, likely reactive, resolving -Secondary to recent procedure, no signs or symptoms of infection, continue to follow closely - downtrending appropriatlely  Hypotension with history of hypertension -Questionably secondary to above, resolving - off dobutamine 7/25 -Diastolic blood pressure still elevated today, diuresing prior CT surgery   Acute kidney injury, resolved -Likely secondary to initial hypotension as above -Follow now that diuretics have been resumed   Type 2 diabetes, uncontrolled, with hyperglycemia  -Uncontrolled: A1c 9.1 -Continue sliding scale insulin, hypoglycemic protocol diabetic diet -Hold home glycemic medications -Advance diet as tolerated back to diabetic diet   Hyperlipidemia - Continue atorvastatin. Lipid panel shows poorly controled profile   Depression/anxiety - Continue Wellbutrin. Code status: Full code lengthy discussion with patient and friend at bedside, it appears patient was unclear about what full code versus DNR meant.  Recommend follow-up with MOST form as her wishes are essentially not to be maintained in a vegetative state if recovery is unclear/not expected, but otherwise continue all medical care at full code.  Family Communication: no family at bedside, offered to call Sister -patient to update  Status is: Inpatient Remains inpatient appropriate because: severity of illness Level of care:  Progressive  Consultants:  Cardiology, cardiothoracic surgery, PCCM  Objective: Vitals:   01/04/23 1603 01/04/23 2101 01/04/23 2255 01/05/23 0305  BP: (!) 143/62 121/62 128/66 (!) 114/52  Pulse: 87 73  67  Resp: 17 19 18 17   Temp: 98.5 F (36.9 C) 98.1 F (36.7 C) 98.2 F (36.8 C) 98 F (36.7 C)  TempSrc: Oral Oral Oral Oral  SpO2: 98% 95% 100% 98%  Weight:    91.9 kg  Height:        Intake/Output Summary (Last 24 hours) at 01/05/2023 0811 Last data filed at 01/05/2023 0451 Gross per 24 hour  Intake 240 ml  Output --  Net 240 ml   Wt Readings from Last 3 Encounters:  01/05/23 91.9 kg  11/26/22 93 kg  08/21/22 91.2 kg    Examination:  Constitutional: NAD Eyes: no scleral icterus ENMT: Mucous membranes are moist.  Neck: normal, supple Respiratory: clear to auscultation bilaterally, no wheezing, no crackles.  Cardiovascular: Sternotomy site clean dry intact Abdomen: non distended, no tenderness. Bowel sounds positive.  Extremities: Without overt edema/focal neurodeficits  Data Reviewed: I have independently reviewed following labs and imaging studies   CBC Recent Labs  Lab 01/01/23 2200 01/02/23 0157 01/02/23 0334 01/02/23 0401 01/02/23 1531 01/03/23 0405 01/04/23 0020  WBC 19.4*  --   --  16.2* 16.1* 10.9* 10.8*  HGB 11.7*   < > 10.5* 11.0* 10.6* 9.6* 10.1*  HCT 34.2*   < > 31.0* 31.3* 31.1* 29.2* 30.9*  PLT 156  --   --  138* 125* 111* 141*  MCV 87.0  --   --  87.9 90.1 91.0 92.2  MCH 29.8  --   --  30.9 30.7 29.9 30.1  MCHC 34.2  --   --  35.1 34.1 32.9 32.7  RDW 13.2  --   --  13.5 14.2 14.4 14.0   < > = values in this interval not displayed.    Recent Labs  Lab 12/30/22 0407 12/31/22 0403 12/31/22 0411 12/31/22 2034 01/01/23 0005 01/01/23 1729 01/01/23 1914 01/01/23 2200 01/02/23 0157 01/02/23 0334 01/02/23 0401 01/02/23 1531 01/03/23 0405 01/04/23 0020  NA 135   < >  --   --    < >  --    < > 137   < > 144 139 133* 136 134*  K 3.7    < >  --   --    < >  --    < > 4.5   < > 3.4* 3.3* 3.6 4.1 3.6  CL 105   < >  --   --    < >  --   --  110  --   --  109 108 107 105  CO2 18*   < >  --   --    < >  --   --  20*  --   --  22 19* 22 22  GLUCOSE 210*   < >  --   --    < >  --   --  190*  --   --  150* 234* 196* 171*  BUN 15   < >  --   --    < >  --   --  16  --   --  14 12 9 10   CREATININE 0.95   < >  --   --    < >  --   --  0.91  --   --  0.87 0.85 0.83 0.89  CALCIUM 9.0   < >  --   --    < >  --   --  7.7*  --   --  8.1* 7.7* 8.3* 8.5*  MG 1.9  --   --   --   --   --   --  2.9*  --   --  2.2 1.9  --   --   INR  --   --   --  1.0  --  1.3*  --   --   --   --   --   --   --   --   HGBA1C  --   --  8.6*  --   --   --   --   --   --   --   --   --   --   --    < > = values in this interval not displayed.    Lab Results  Component Value Date   HGBA1C 8.6 (H) 12/31/2022      Component Value Date/Time   BNP 46.8 12/27/2022 1618    CBG: Recent Labs  Lab 01/04/23 1121 01/04/23 1605 01/04/23 2058 01/04/23 2334 01/05/23 0425  GLUCAP 198* 200* 275* 246* 131*    Recent Results (from the past 240 hour(s))  SARS Coronavirus 2 by RT PCR (hospital order, performed in [facility] hospital lab) *cepheid single result test* Anterior Nasal Swab     Status: None   Collection Time: 12/27/22  4:18 PM   Specimen: Anterior Nasal Swab  Result Value Ref Range Status   SARS Coronavirus 2 by RT PCR NEGATIVE NEGATIVE Final    Comment: (NOTE) SARS-CoV-2 target nucleic acids are NOT DETECTED.  The SARS-CoV-2 RNA is generally detectable in upper and lower respiratory specimens during the acute phase of infection. The lowest concentration of SARS-CoV-2 viral copies this assay can detect is 250 copies / mL. A negative result does not preclude SARS-CoV-2 infection and should not be used as the sole basis for treatment or other patient management decisions.  A negative result may occur with improper specimen collection / handling,  submission of specimen other than nasopharyngeal swab, presence of viral mutation(s) within the areas targeted by this assay, and inadequate number of viral copies (<250 copies / mL). A negative result must be combined with clinical observations, patient history, and epidemiological information.  Fact Sheet for Patients:   RoadLapTop.co.za  Fact Sheet for Healthcare Providers: http://kim-miller.com/  This test is not yet approved or  cleared by the [place] FDA and has been authorized for detection and/or diagnosis of SARS-CoV-2 by FDA under an Emergency Use Authorization (EUA).  This EUA will remain in effect (meaning this test can be used) for the duration of the COVID-19 declaration under Section 564(b)(1) of the Act, 21 U.S.C. section 360bbb-3(b)(1), unless the authorization is terminated or revoked sooner.  Performed at [facility], [address] [place], [place] [address]   Surgical pcr screen     Status: None   Collection Time: 12/31/22 11:22 PM   Specimen: Nasal Mucosa; Nasal Swab  Result Value Ref Range Status   MRSA, PCR NEGATIVE NEGATIVE Final   Staphylococcus aureus NEGATIVE NEGATIVE Final    Comment: (NOTE) The Xpert SA Assay (FDA approved for NASAL specimens in patients 85 years of age and older), is one component of a comprehensive surveillance program. It is not intended to diagnose infection nor to  guide or monitor treatment. Performed at [facility], [address] [place], [place] [address]      Radiology Studies: No results found.  Carma Leaven DO [facility]  Between 7 pm - 7 am I am not available, please contact night coverage MD/APP via Amion

## 2023-01-05 NOTE — Care Management (Cosign Needed)
    Durable Medical Equipment  (From admission, onward)           Start     Ordered   01/05/23 1410  For home use only DME 4 wheeled rolling walker with seat  Once       Question:  Patient needs a walker to treat with the following condition  Answer:  Weakness   01/05/23 1410

## 2023-01-05 NOTE — Evaluation (Signed)
Physical Therapy Evaluation Patient Details Name: [name] MRN: [id number] DOB: [birth year] Today's Date: 01/05/2023  History of Present Illness  Pt is 62 year old presented to [facility] on  12/27/22 for chest pain. Underwent CABG x 3 on 01/01/23. PMH - DM, HTN, anxiety, migraines  Clinical Impression  Pt moving very well and is able to ambulate modified independent using rollator or no assistive device. Pt following sternal precautions beautifully with all mobility. Pt reports in the past she has had some lightheadedness when getting up to bathroom in middle of the night. Denies any of this while here. She knows she should pause on EOB when getting up in the middle of the night. Offered her the option of bsc at night at home if this would make her more comfortable. She was undecided about this. No further skilled PT needed and PT is signing off. Recommend continued cardiac rehab follow up.       Assistance Recommended at Discharge PRN  If plan is discharge home, recommend the following:  Can travel by private vehicle  Assist for transportation        Equipment Recommendations Rollator (4 wheels);BSC/3in1  Recommendations for Other Services       Functional Status Assessment Patient has not had a recent decline in their functional status     Precautions / Restrictions Precautions Precautions: Sternal      Mobility  Bed Mobility Overal bed mobility: Modified Independent                  Transfers Overall transfer level: Modified independent Equipment used: None                    Ambulation/Gait Ambulation/Gait assistance: Modified independent (Device/Increase time) Gait Distance (Feet): 400 Feet Assistive device: Rollator (4 wheels), None Gait Pattern/deviations: Decreased stride length Gait velocity: decr Gait velocity interpretation: 1.31 - 2.62 ft/sec, indicative of limited community ambulator   General Gait Details: Steady gait with and  without assistive device. Feels more comfortable with rollator  Stairs            Wheelchair Mobility     Tilt Bed    Modified Rankin (Stroke Patients Only)       Balance Overall balance assessment: No apparent balance deficits (not formally assessed)                                           Pertinent Vitals/Pain Pain Assessment Pain Assessment: Faces Faces Pain Scale: No hurt    Home Living Family/patient expects to be discharged to:: Private residence Living Arrangements: Alone Available Help at Discharge: Neighbor;Friend(s);Available PRN/intermittently Type of Home: Apartment Home Access: Level entry       Home Layout: One level Home Equipment: None      Prior Function Prior Level of Function : Independent/Modified Independent;Driving                     Hand Dominance        Extremity/Trunk Assessment   Upper Extremity Assessment Upper Extremity Assessment:  (Limited by sternal precautions)    Lower Extremity Assessment Lower Extremity Assessment: Overall WFL for tasks assessed       Communication   Communication: No difficulties  Cognition Arousal/Alertness: Awake/alert Behavior During Therapy: WFL for tasks assessed/performed Overall Cognitive Status: Within Functional Limits for tasks assessed  General Comments General comments (skin integrity, edema, etc.): VSS on RA    Exercises     Assessment/Plan    PT Assessment Patient does not need any further PT services  PT Problem List         PT Treatment Interventions      PT Goals (Current goals can be found in the Care Plan section)  Acute Rehab PT Goals PT Goal Formulation: All assessment and education complete, DC therapy    Frequency       Co-evaluation               AM-PAC PT "6 Clicks" Mobility  Outcome Measure Help needed turning from your back to your side while in a flat  bed without using bedrails?: None Help needed moving from lying on your back to sitting on the side of a flat bed without using bedrails?: None Help needed moving to and from a bed to a chair (including a wheelchair)?: None Help needed standing up from a chair using your arms (e.g., wheelchair or bedside chair)?: None Help needed to walk in hospital room?: None Help needed climbing 3-5 steps with a railing? : None 6 Click Score: 24    End of Session   Activity Tolerance: Patient tolerated treatment well Patient left: in bed;with call bell/phone within reach;with family/visitor present (sitting EOB) Nurse Communication: Mobility status PT Visit Diagnosis: Difficulty in walking, not elsewhere classified (R26.2)    Time: 0272-5366 PT Time Calculation (min) (ACUTE ONLY): 21 min   Charges:   PT Evaluation $PT Eval Low Complexity: 1 Low   PT General Charges $$ ACUTE PT VISIT: 1 Visit         [facility] PT Acute Rehabilitation Services Office [phone]   Angelina Ok [facility] 01/05/2023, 11:59 AM

## 2023-01-05 NOTE — TOC Progression Note (Addendum)
Transition of Care [facility]) - Progression Note    Patient Details  Name: [name] MRN: [id number] Date of Birth: 05/21/1961  [facility]) CM/SW Contact  Ronny Bacon, RN Phone Number: 01/05/2023, 2:12 PM  Clinical Narrative:  PT recommendations for rollator noted. Rollator ordered through [employer] rep.   1504- [employer] rep reports unable to provide rollator due to insurance coverage is out of network. Jermaine- [employer] able to Air traffic controller using insurance coverage.         Expected Discharge Plan and Services                                               Social Determinants of Health (SDOH) Interventions SDOH Screenings   Food Insecurity: No Food Insecurity (12/28/2022)  Recent Concern: Food Insecurity - Food Insecurity Present (11/23/2022)  Housing: Low Risk  (12/28/2022)  Transportation Needs: No Transportation Needs (12/28/2022)  Utilities: Not At Risk (12/28/2022)  Alcohol Screen: Low Risk  (11/23/2022)  Depression (PHQ2-9): Low Risk  (11/26/2022)  Financial Resource Strain: Low Risk  (12/24/2022)   Received from [facility]  Recent Concern: Financial Resource Strain - High Risk (11/23/2022)  Physical Activity: Insufficiently Active (11/23/2022)  Social Connections: Moderately Integrated (11/23/2022)  Stress: No Stress Concern Present (11/23/2022)  Tobacco Use: Low Risk  (12/27/2022)    Readmission Risk Interventions     No data to display

## 2023-01-05 NOTE — Progress Notes (Signed)
CARDIAC REHAB PHASE I   PRE:  Rate/Rhythm: Sinus 87  BP:    Sitting: 100/60     SaO2: 98% RA  MODE:  Ambulation: 550 ft   POST:  Rate/Rhythem: 85  BP:   Sitting: 146/48     SaO2: 96% RA  [id number] Ambulated independently in the hallway using walker without complaints. Tolerated well. Had BM this morning. Assisted to recliner with call bell within reach. Tolerated well. Using incentive spirometer.  Thayer Headings RN

## 2023-01-06 LAB — GLUCOSE, CAPILLARY
Glucose-Capillary: 126 mg/dL — ABNORMAL HIGH (ref 70–99)
Glucose-Capillary: 136 mg/dL — ABNORMAL HIGH (ref 70–99)
Glucose-Capillary: 141 mg/dL — ABNORMAL HIGH (ref 70–99)
Glucose-Capillary: 166 mg/dL — ABNORMAL HIGH (ref 70–99)
Glucose-Capillary: 175 mg/dL — ABNORMAL HIGH (ref 70–99)
Glucose-Capillary: 238 mg/dL — ABNORMAL HIGH (ref 70–99)

## 2023-01-06 MED ORDER — CLOPIDOGREL BISULFATE 75 MG PO TABS
75.0000 mg | ORAL_TABLET | Freq: Every day | ORAL | Status: DC
  Administered 2023-01-06 – 2023-01-11 (×6): 75 mg via ORAL
  Filled 2023-01-06 (×6): qty 1

## 2023-01-06 MED ORDER — LOSARTAN POTASSIUM 25 MG PO TABS
25.0000 mg | ORAL_TABLET | Freq: Every day | ORAL | Status: DC
  Administered 2023-01-06 – 2023-01-11 (×6): 25 mg via ORAL
  Filled 2023-01-06 (×6): qty 1

## 2023-01-06 NOTE — Progress Notes (Signed)
Pt ambulated x 470 feet independently, pt tolerated well

## 2023-01-06 NOTE — Progress Notes (Signed)
[address]       [place] [address]             [phone]     5 Days Post-Op Procedure(s) (LRB): CORONARY ARTERY BYPASS GRAFTING (CABG) TIMES THREE USING THE LEFT INTERNAL MAMMARY ARTERY (LIMA) AND ENDOSCOPICALLY HARVESTED RIGHT GREATER SAPHENOUS VEIN (N/A) TRANSESOPHAGEAL ECHOCARDIOGRAM (N/A) Subjective: Conts to feel better  Objective: Vital signs in last 24 hours: Temp:  [98 F (36.7 C)-98.8 F (37.1 C)] 98.4 F (36.9 C) (07/28 0805) Pulse Rate:  [77-88] 87 (07/28 0805) Cardiac Rhythm: Normal sinus rhythm (07/27 1929) Resp:  [13-20] 20 (07/28 0805) BP: (121-153)/(54-81) 153/67 (07/28 0805) SpO2:  [95 %-100 %] 97 % (07/28 0805) Weight:  [90.8 kg] 90.8 kg (07/28 0300)  Hemodynamic parameters for last 24 hours:    Intake/Output from previous day: 07/27 0701 - 07/28 0700 In: 600 [P.O.:600] Out: -  Intake/Output this shift: No intake/output data recorded.  General appearance: alert, cooperative, and no distress Heart: regular rate and rhythm Lungs: mildly dim in bases Abdomen: benign Extremities: no edema Wound: incis healing well  Lab Results: Recent Labs    01/04/23 0020  WBC 10.8*  HGB 10.1*  HCT 30.9*  PLT 141*   BMET:  Recent Labs    01/04/23 0020  NA 134*  K 3.6  CL 105  CO2 22  GLUCOSE 171*  BUN 10  CREATININE 0.89  CALCIUM 8.5*    PT/INR: No results for input(s): "LABPROT", "INR" in the last 72 hours. ABG    Component Value Date/Time   PHART 7.366 01/02/2023 0334   HCO3 19.4 (L) 01/02/2023 0334   TCO2 20 (L) 01/02/2023 0334   ACIDBASEDEF 5.0 (H) 01/02/2023 0334   O2SAT 96 01/02/2023 0334   CBG (last 3)  Recent Labs    01/05/23 1928 01/06/23 0017 01/06/23 0432  GLUCAP 195* 141* 126*    Meds Scheduled Meds:  acetaminophen  1,000 mg Oral Q6H   Or   acetaminophen (TYLENOL) oral liquid 160 mg/5 mL  1,000 mg Per Tube Q6H   atorvastatin  80 mg Oral Daily   bisacodyl  10 mg Oral Daily   Or   bisacodyl   10 mg Rectal Daily   Chlorhexidine Gluconate Cloth  6 each Topical Daily   clopidogrel  75 mg Oral Daily   docusate sodium  200 mg Oral BID   enoxaparin (LOVENOX) injection  40 mg Subcutaneous QHS   furosemide  40 mg Oral Daily   glipiZIDE  10 mg Oral Q breakfast   insulin aspart  0-24 Units Subcutaneous Q4H   insulin glargine-yfgn  10 Units Subcutaneous QHS   metFORMIN  500 mg Oral BID WC   pantoprazole  40 mg Oral Daily   polyethylene glycol  17 g Oral BID   topiramate  50 mg Oral BID   Continuous Infusions:  albumin human Stopped (01/02/23 0018)   insulin Stopped (01/02/23 1058)   lactated ringers     lactated ringers Stopped (01/02/23 1055)   PRN Meds:.albumin human, dextrose, fentaNYL (SUBLIMAZE) injection, metoprolol tartrate, midazolam, ondansetron (ZOFRAN) IV, mouth rinse, oxyCODONE, traMADol  Xrays No results found.  Assessment/Plan: S/P Procedure(s) (LRB): CORONARY ARTERY BYPASS GRAFTING (CABG) TIMES THREE USING THE LEFT INTERNAL MAMMARY ARTERY (LIMA) AND ENDOSCOPICALLY HARVESTED RIGHT GREATER SAPHENOUS VEIN (N/A) TRANSESOPHAGEAL ECHOCARDIOGRAM (N/A) POD#5  1 afeb, VSS, s BP 120's-150's, mostly on higher range- will add ARB, sinus with rare missed beat- beta blocker has been stopped  2 O2 sats good on RA 3 BS control improving- home meds added 4 plavix added per neuro recs- allergic to ASA 5 no new labs or xrays 6 voiding - not measured, weight at preop- will d/c diuretics 7 recheck labs in am 8 routine pulm hygiene and rehab modalities 9 if no short term snf options likely home in am- lives alone   LOS: 10 days    Rowe Clack PA-C Pager [phone] 01/06/2023

## 2023-01-07 DIAGNOSIS — Z951 Presence of aortocoronary bypass graft: Secondary | ICD-10-CM | POA: Diagnosis not present

## 2023-01-07 LAB — GLUCOSE, CAPILLARY
Glucose-Capillary: 127 mg/dL — ABNORMAL HIGH (ref 70–99)
Glucose-Capillary: 132 mg/dL — ABNORMAL HIGH (ref 70–99)
Glucose-Capillary: 142 mg/dL — ABNORMAL HIGH (ref 70–99)
Glucose-Capillary: 153 mg/dL — ABNORMAL HIGH (ref 70–99)
Glucose-Capillary: 161 mg/dL — ABNORMAL HIGH (ref 70–99)
Glucose-Capillary: 231 mg/dL — ABNORMAL HIGH (ref 70–99)

## 2023-01-07 MED ORDER — INSULIN ASPART 100 UNIT/ML IJ SOLN
0.0000 [IU] | Freq: Three times a day (TID) | INTRAMUSCULAR | Status: DC
  Administered 2023-01-08: 2 [IU] via SUBCUTANEOUS
  Administered 2023-01-08: 4 [IU] via SUBCUTANEOUS
  Administered 2023-01-08 – 2023-01-10 (×3): 2 [IU] via SUBCUTANEOUS
  Administered 2023-01-10 – 2023-01-11 (×2): 4 [IU] via SUBCUTANEOUS

## 2023-01-07 MED ORDER — INSULIN ASPART 100 UNIT/ML IJ SOLN
2.0000 [IU] | Freq: Three times a day (TID) | INTRAMUSCULAR | Status: DC
  Administered 2023-01-07 – 2023-01-11 (×8): 2 [IU] via SUBCUTANEOUS

## 2023-01-07 MED ORDER — POTASSIUM CHLORIDE CRYS ER 20 MEQ PO TBCR
20.0000 meq | EXTENDED_RELEASE_TABLET | Freq: Two times a day (BID) | ORAL | Status: AC
  Administered 2023-01-07 (×2): 20 meq via ORAL
  Filled 2023-01-07 (×2): qty 1

## 2023-01-07 MED ORDER — PROPRANOLOL HCL 10 MG PO TABS
10.0000 mg | ORAL_TABLET | Freq: Two times a day (BID) | ORAL | Status: DC
  Administered 2023-01-07 (×2): 10 mg via ORAL
  Filled 2023-01-07 (×3): qty 1

## 2023-01-07 MED ORDER — METOPROLOL TARTRATE 12.5 MG HALF TABLET: 12.5000 mg | ORAL_TABLET | Freq: Two times a day (BID) | ORAL | Status: DC

## 2023-01-07 MED ORDER — POTASSIUM CHLORIDE CRYS ER 20 MEQ PO TBCR: 20.0000 meq | EXTENDED_RELEASE_TABLET | Freq: Once | ORAL | Status: DC

## 2023-01-07 NOTE — Progress Notes (Signed)
Rounding Note    Patient Name: [name] Date of Encounter: 01/07/2023  [facility] Cardiologist: None   Subjective   Does not feel well today.  Complaining of nausea.  No chest pain or shortness of breath.  Inpatient Medications    Scheduled Meds:  atorvastatin  80 mg Oral Daily   bisacodyl  10 mg Oral Daily   Or   bisacodyl  10 mg Rectal Daily   clopidogrel  75 mg Oral Daily   docusate sodium  200 mg Oral BID   enoxaparin (LOVENOX) injection  40 mg Subcutaneous QHS   glipiZIDE  10 mg Oral Q breakfast   insulin aspart  0-24 Units Subcutaneous Q4H   insulin aspart  2 Units Subcutaneous TID WC   insulin glargine-yfgn  10 Units Subcutaneous QHS   losartan  25 mg Oral Daily   metFORMIN  500 mg Oral BID WC   pantoprazole  40 mg Oral Daily   polyethylene glycol  17 g Oral BID   potassium chloride  20 mEq Oral BID   propranolol  10 mg Oral BID   topiramate  50 mg Oral BID   Continuous Infusions:  albumin human Stopped (01/02/23 0018)   lactated ringers     lactated ringers Stopped (01/02/23 1055)   PRN Meds: albumin human, dextrose, metoprolol tartrate, midazolam, ondansetron (ZOFRAN) IV, mouth rinse, oxyCODONE, traMADol   Vital Signs    Vitals:   01/07/23 0409 01/07/23 0819 01/07/23 1247 01/07/23 1619  BP: 129/65 129/60 (!) 147/70 117/63  Pulse: 78 89 87 72  Resp:      Temp: 98.1 F (36.7 C) 97.9 F (36.6 C) 98.3 F (36.8 C) 97.8 F (36.6 C)  TempSrc: Oral Oral Oral Oral  SpO2: 100% 100% 96% 100%  Weight: 90.2 kg     Height:        Intake/Output Summary (Last 24 hours) at 01/07/2023 1754 Last data filed at 01/06/2023 2000 Gross per 24 hour  Intake 120 ml  Output --  Net 120 ml      01/07/2023    4:09 AM 01/06/2023    3:00 AM 01/05/2023    3:05 AM  Last 3 Weights  Weight (lbs) 198 lb 13.7 oz 200 lb 3.2 oz 202 lb 9.6 oz  Weight (kg) 90.2 kg 90.81 kg 91.899 kg      Telemetry    Normal sinus rhythm with few short runs of SVT -  Personally Reviewed   Physical Exam  Alert, oriented, no distress GEN: No acute distress.   Neck: No JVD Cardiac: RRR, no murmurs, rubs, or gallops.  Respiratory: Clear to auscultation bilaterally. GI: Soft, nontender, non-distended  MS: No edema; No deformity. Neuro:  Nonfocal  Psych: Normal affect   Labs    High Sensitivity Troponin:   Recent Labs  Lab 12/27/22 1618 12/27/22 1810  TROPONINIHS 38* 34*     Chemistry Recent Labs  Lab 01/01/23 2200 01/02/23 0157 01/02/23 0401 01/02/23 1531 01/03/23 0405 01/04/23 0020 01/07/23 0543  NA 137   < > 139 133* 136 134* 137  K 4.5   < > 3.3* 3.6 4.1 3.6 3.5  CL 110  --  109 108 107 105 106  CO2 20*  --  22 19* 22 22 22   GLUCOSE 190*  --  150* 234* 196* 171* 165*  BUN 16  --  14 12 9 10 15   CREATININE 0.91  --  0.87 0.85 0.83 0.89 0.95  CALCIUM  7.7*  --  8.1* 7.7* 8.3* 8.5* 9.1  MG 2.9*  --  2.2 1.9  --   --   --   GFRNONAA >60  --  >60 >60 >60 >60 >60  ANIONGAP 7  --  8 6 7 7 9    < > = values in this interval not displayed.    Lipids No results for input(s): "CHOL", "TRIG", "HDL", "LABVLDL", "LDLCALC", "CHOLHDL" in the last 168 hours.  Hematology Recent Labs  Lab 01/03/23 0405 01/04/23 0020 01/07/23 0543  WBC 10.9* 10.8* 7.6  RBC 3.21* 3.35* 3.35*  HGB 9.6* 10.1* 10.5*  HCT 29.2* 30.9* 31.0*  MCV 91.0 92.2 92.5  MCH 29.9 30.1 31.3  MCHC 32.9 32.7 33.9  RDW 14.4 14.0 13.2  PLT 111* 141* 241   Thyroid No results for input(s): "TSH", "FREET4" in the last 168 hours.  BNPNo results for input(s): "BNP", "PROBNP" in the last 168 hours.  DDimer No results for input(s): "DDIMER" in the last 168 hours.   Radiology    No results found.   Patient Profile     62 y.o. female with diabetes underwent cardiac catheterization 12/28/2022 and was found to have severe multivessel CAD.  Patient is treated with multivessel CABG 01/01/2023 with a LIMA to LAD, saphenous vein graft to PDA, and saphenous vein graft obtuse  marginal  Assessment & Plan    1.  Multivessel CAD status post CABG 2.  SVT, short runs noted 3.  Type 2 diabetes  Rhythm is stable with only short runs of SVT.  Agree with observation on telemetry overnight.  Will reassess in the morning.  Medications reviewed patiently currently on a high intensity statin drug, clopidogrel, Inderal, and losartan.      For questions or updates, please contact [facility] Please consult [email or web address] for contact info under        Signed, Tonny Bollman, MD  01/07/2023, 5:54 PM

## 2023-01-07 NOTE — Progress Notes (Addendum)
[address]       [place] [address]             [phone]      6 Days Post-Op Procedure(s) (LRB): CORONARY ARTERY BYPASS GRAFTING (CABG) TIMES THREE USING THE LEFT INTERNAL MAMMARY ARTERY (LIMA) AND ENDOSCOPICALLY HARVESTED RIGHT GREATER SAPHENOUS VEIN (N/A) TRANSESOPHAGEAL ECHOCARDIOGRAM (N/A) Subjective: Pt reports she had a lot of pain in her left shoulder blade last night but it resolved with pain medication. She also reports feeling like her heart was racing this AM and stayed in bed longer than usual because she felt more fatigued.   Objective: Vital signs in last 24 hours: Temp:  [98.1 F (36.7 C)-98.7 F (37.1 C)] 98.1 F (36.7 C) (07/29 0409) Pulse Rate:  [77-87] 78 (07/29 0409) Cardiac Rhythm: Atrial fibrillation (07/28 1900) Resp:  [13-20] 18 (07/28 2345) BP: (125-153)/(60-73) 129/65 (07/29 0409) SpO2:  [97 %-100 %] 100 % (07/29 0409) Weight:  [90.2 kg] 90.2 kg (07/29 0409)  Hemodynamic parameters for last 24 hours:    Intake/Output from previous day: 07/28 0701 - 07/29 0700 In: 120 [P.O.:120] Out: -  Intake/Output this shift: No intake/output data recorded.  General appearance: alert, cooperative, and no distress Neurologic: intact Heart: regular rate and rhythm, S1, S2 normal, no murmur, click, rub or gallop Lungs: Slightly diminished bibasilar Abdomen: soft, non-tender; bowel sounds normal; no masses,  no organomegaly Extremities: extremities normal, atraumatic, no cyanosis or edema Wound: Clean and dry, no erythema or sign of infection  Lab Results: Recent Labs    01/07/23 0543  WBC 7.6  HGB 10.5*  HCT 31.0*  PLT 241   BMET:  Recent Labs    01/07/23 0543  NA 137  K 3.5  CL 106  CO2 22  GLUCOSE 165*  BUN 15  CREATININE 0.95  CALCIUM 9.1    PT/INR: No results for input(s): "LABPROT", "INR" in the last 72 hours. ABG    Component Value Date/Time   PHART 7.366 01/02/2023 0334   HCO3 19.4 (L) 01/02/2023 0334    TCO2 20 (L) 01/02/2023 0334   ACIDBASEDEF 5.0 (H) 01/02/2023 0334   O2SAT 96 01/02/2023 0334   CBG (last 3)  Recent Labs    01/06/23 2057 01/06/23 2347 01/07/23 0406  GLUCAP 175* 136* 153*    Assessment/Plan: S/P Procedure(s) (LRB): CORONARY ARTERY BYPASS GRAFTING (CABG) TIMES THREE USING THE LEFT INTERNAL MAMMARY ARTERY (LIMA) AND ENDOSCOPICALLY HARVESTED RIGHT GREATER SAPHENOUS VEIN (N/A) TRANSESOPHAGEAL ECHOCARDIOGRAM (N/A)  Neuro: Hx of scattered embolic appearing infarcts, neurology recommended Plavix. Pt is allergic to ASA. Will follow up with neurology as an outpatient. Pt with pain into left shoulder blade last night, has resolved with pain medication this AM.   CV: Beta blocker discontinued due to sinus pauses. Pt started on Cozaar for blood pressure control. SBP controlled. Looks like a possible burst of atrial fibrillation with RVR into the 150s early this AM, pt states she felt her heart racing and was more fatigued than normal this AM but this is improving. Now in NSR with HR 80s, will monitor.   Pulm: Saturating 100% on RA. Last CXR with atelectasis. Encourage IS and ambulation.   GI: +BM 07/27, good appetite.  Endo: CBGs 175/136/153. On Metformin 500mg  BID, glipizide 10mg  daily, Semglee 10U daily and SSI. Will restart Rybelsus at discharge. Preop A1C 8.6, will need close outpatient follow up.   Renal: Cr 0.95, no UO recorded. Pt voiding regularly. Under preop weight.  Diuretics have been d/c'd.  ID: Leukocytosis resolved.  Expected postop ABLA: H/H 10.5/31, stable.   Thrombocytopenia: Resolved  Dispo: Pt lives alone and will have no help at home. Will discuss SNF options with case manager, possibly home if no SNF availability. Home tomorrow AM if heart rate and rhythm remains stable.    LOS: 11 days    Jenny Reichmann, PA-C 01/07/2023  Agree with above HR improved. Dispo planning  Renai Lopata Keane Scrape

## 2023-01-07 NOTE — Progress Notes (Signed)
CARDIAC REHAB PHASE I    Pt has just returned from independent walk in hallway. Pt able to ambulate independently with no dizziness or SOB. Reviewed sternal precautions and IS use. Encouraged continued ambulation and oob to chair. Will continue to follow.   [id number]  Woodroe Chen, RN BSN 01/07/2023 10:08 AM

## 2023-01-07 NOTE — Progress Notes (Signed)
Mobility Specialist Progress Note:   01/07/23 1400  Mobility  Activity Ambulated independently in hallway  Level of Assistance Independent after set-up  Assistive Device None  Distance Ambulated (ft) 470 ft  RUE Weight Bearing NWB  LUE Weight Bearing NWB  Activity Response Tolerated well  Mobility Referral Yes  $Mobility charge 1 Mobility  Mobility Specialist Start Time (ACUTE ONLY) 1433  Mobility Specialist Stop Time (ACUTE ONLY) 1447  Mobility Specialist Time Calculation (min) (ACUTE ONLY) 14 min    Pre Mobility: 89 HR During Mobility: 97 HR Post Mobility:  85 HR  Pt received in bed, agreeable to mobility. MinA for bed mobility. Asymptomatic throughout. Pt left on EOB with call bell and all needs met.   D'Vante Earlene Plater Mobility Specialist Please contact via Special educational needs teacher or Rehab office at [phone]

## 2023-01-08 DIAGNOSIS — I442 Atrioventricular block, complete: Secondary | ICD-10-CM

## 2023-01-08 DIAGNOSIS — R001 Bradycardia, unspecified: Secondary | ICD-10-CM | POA: Diagnosis not present

## 2023-01-08 LAB — GLUCOSE, CAPILLARY
Glucose-Capillary: 147 mg/dL — ABNORMAL HIGH (ref 70–99)
Glucose-Capillary: 196 mg/dL — ABNORMAL HIGH (ref 70–99)
Glucose-Capillary: 134 mg/dL — ABNORMAL HIGH (ref 70–99)
Glucose-Capillary: 144 mg/dL — ABNORMAL HIGH (ref 70–99)

## 2023-01-08 MED ORDER — CHLORHEXIDINE GLUCONATE CLOTH 2 % EX PADS
6.0000 | MEDICATED_PAD | Freq: Every day | CUTANEOUS | Status: DC
  Administered 2023-01-08 – 2023-01-11 (×4): 6 via TOPICAL

## 2023-01-08 MED ORDER — MAGNESIUM OXIDE -MG SUPPLEMENT 400 (240 MG) MG PO TABS
400.0000 mg | ORAL_TABLET | Freq: Two times a day (BID) | ORAL | Status: DC
  Administered 2023-01-08 – 2023-01-11 (×7): 400 mg via ORAL
  Filled 2023-01-08 (×7): qty 1

## 2023-01-08 MED ORDER — METOPROLOL TARTRATE 12.5 MG HALF TABLET
12.5000 mg | ORAL_TABLET | Freq: Two times a day (BID) | ORAL | Status: DC
  Administered 2023-01-08: 12.5 mg via ORAL
  Filled 2023-01-08: qty 1

## 2023-01-08 MED ORDER — AMIODARONE HCL 200 MG PO TABS
200.0000 mg | ORAL_TABLET | Freq: Two times a day (BID) | ORAL | Status: DC
  Administered 2023-01-08: 200 mg via ORAL
  Filled 2023-01-08: qty 1

## 2023-01-08 NOTE — Progress Notes (Signed)
Report called and given to Honor Junes, RN on [facility] ICU. All questions answered to satisfaction. Pt transferred to 2H20 via wheelchair with all belongings.

## 2023-01-08 NOTE — Progress Notes (Signed)
Per Aram Beecham at [facility], pt HR dropped to 20s in heart block, strip saved for MD to evaluate. Aloha Gell, PA notified and came to bedside. See new order for transfer to [facility] ICU.

## 2023-01-08 NOTE — Plan of Care (Signed)
  Problem: Pain Managment: Goal: General experience of comfort will improve Outcome: Progressing   Problem: Safety: Goal: Ability to remain free from injury will improve Outcome: Progressing   Problem: Skin Integrity: Goal: Risk for impaired skin integrity will decrease Outcome: Progressing   

## 2023-01-08 NOTE — Progress Notes (Signed)
CARDIAC REHAB PHASE I   Pt resting in bed, has ambulated independently two times this morning. Post OHS education including site care, restrictions, risk factors, sternal precautions, IS use at home, home needs at discharge, heart healthy diabetic diet, exercise guidelines and CRP2 reviewed. All questions and concerns addressed. Will refer to [facility] for CRP2. Will continue to follow.   [id number]    Woodroe Chen, RN BSN 01/08/2023 9:42 AM

## 2023-01-08 NOTE — TOC Initial Note (Signed)
Transition of Care (TOC) - Initial/Assessment Note  Donn Pierini RN, BSN Transitions of Care Unit 4E- RN Case Manager See Treatment Team for direct phone #   Patient Details  Name: [name] MRN: [id number] Date of Birth: 06-06-1961  Transition of Care [facility]) CM/SW Contact:    Darrold Span, RN Phone Number: 01/08/2023, 11:32 AM  Clinical Narrative:                 CM spoke with pt at bedside to discuss transition needs. Pt voiced she lives alone and has no one to assist. Spouse died during COVID and she has no children. Family lives in [place], no one else local.  Pt voiced that her minister came today to visit- discussed with minister seeing if any one in the church might can assist, pt voiced she is doubtful. Pt did express that her pastor can assist with transport home.  Discussed with pt that insurance does not provide someone to stay in the home to assist- that patients have to cover that out of pocket- pt voiced she does not have the money for that. Also discussed that PT has not made any recommendations for f/u- and even if [facility] was able to be set up -that would not provide someone in the home on a daily basis or to assist with home care needs. Pt voiced understanding.   Discussed DME needs- pt voiced she would need [employer] and [facility] for home- orders have been placed- pt voiced she has no preference on provider.   Also discussed medications- pt would like to use TOC pharmacy to fill any new meds if possible.  Call made to [facility] liaison for DME needs- rollator and [facility]- to be delivered tomorrow.    Expected Discharge Plan: Home/Self Care Barriers to Discharge: Continued Medical Work up   Patient Goals and CMS Choice Patient states their goals for this hospitalization and ongoing recovery are:: return home CMS Medicare.gov Compare Post Acute Care list provided to:: Patient Choice offered to / list presented to : Patient      Expected Discharge Plan and  Services   Discharge Planning Services: CM Consult Post Acute Care Choice: Durable Medical Equipment Living arrangements for the past 2 months: Single Family Home                 DME Arranged: Bedside commode, Walker rolling with seat DME Agency: [employer] Date DME Agency Contacted: 01/07/23 Time DME Agency Contacted: 1500 Representative spoke with at DME Agency: Vaughan Basta HH Arranged: NA HH Agency: NA        Prior Living Arrangements/Services Living arrangements for the past 2 months: Single Family Home Lives with:: Self Patient language and need for interpreter reviewed:: Yes Do you feel safe going back to the place where you live?: Yes (states she does not have anyone to assist)      Need for Family Participation in Patient Care: Yes (Comment) Care giver support system in place?: No (comment)   Criminal Activity/Legal Involvement Pertinent to Current Situation/Hospitalization: No - Comment as needed  Activities of Daily Living Home Assistive Devices/Equipment: Eyeglasses ADL Screening (condition at time of admission) Patient's cognitive ability adequate to safely complete daily activities?: Yes Is the patient deaf or have difficulty hearing?: No Does the patient have difficulty seeing, even when wearing glasses/contacts?: Yes Does the patient have difficulty concentrating, remembering, or making decisions?: No Patient able to express need for assistance with ADLs?: Yes Does the patient have difficulty dressing  or bathing?: No Independently performs ADLs?: Yes (appropriate for developmental age) Does the patient have difficulty walking or climbing stairs?: No Weakness of Legs: None Weakness of Arms/Hands: None  Permission Sought/Granted   Permission granted to share information with : Yes, Verbal Permission Granted     Permission granted to share info w AGENCY: DME        Emotional Assessment Appearance:: Appears stated age Attitude/Demeanor/Rapport:  Engaged Affect (typically observed): Appropriate, Overwhelmed Orientation: : Oriented to Self, Oriented to Place, Oriented to  Time, Oriented to Situation Alcohol / Substance Use: Not Applicable Psych Involvement: No (comment)  Admission diagnosis:  Near syncope [R55] Chest pain [R07.9] Hypotension, unspecified hypotension type [I95.9] Chest pain, unspecified type [R07.9] S/P CABG x 3 [Z95.1] Patient Active Problem List   Diagnosis Date Noted   S/P CABG x 3 01/01/2023   Non-ST elevation (NSTEMI) myocardial infarction (HCC) 12/31/2022   Coronary artery disease due to lipid rich plaque 12/31/2022   Pure hypercholesterolemia 12/31/2022   Chest pain 12/27/2022   Hypotension 12/27/2022   AKI (acute kidney injury) (HCC) 12/27/2022   Carpal tunnel syndrome 08/16/2021   Numbness and tingling in right hand 07/11/2021   Diverticulosis 07/13/2016   Morbid obesity (HCC) 03/31/2015   Hyperlipidemia associated with type 2 diabetes mellitus (HCC)    Hypothyroidism    Hypertension    POLYP, COLON 04/13/2010   Type 2 diabetes mellitus (HCC) 04/13/2010   Anxiety state 04/13/2010   Depression 04/13/2010   Asthma 04/13/2010   Migraines 04/13/2010   PCP:  Bennie Pierini, FNP Pharmacy:   [facility] - [place], [place] - [address] [facility] [place] [address] Phone: [phone] Fax: [phone]  [id number] - [place], [place] - [address] [place] [address] Phone: [phone] Fax: [phone]  [facility] [address] [place] [address] Phone: [phone] Fax: [phone]     Social Determinants of Health (SDOH) Social History: SDOH Screenings   Food Insecurity: No Food Insecurity (12/28/2022)  Recent Concern: Food Insecurity - Food Insecurity Present (11/23/2022)  Housing: Low Risk  (12/28/2022)  Transportation Needs: No Transportation Needs (12/28/2022)   Utilities: Not At Risk (12/28/2022)  Alcohol Screen: Low Risk  (11/23/2022)  Depression (PHQ2-9): Low Risk  (11/26/2022)  Financial Resource Strain: Low Risk  (12/24/2022)   [facility] from [facility]  Recent Concern: Financial Resource Strain - High Risk (11/23/2022)  Physical Activity: Insufficiently Active (11/23/2022)  Social Connections: Moderately Integrated (11/23/2022)  Stress: No Stress Concern Present (11/23/2022)  Tobacco Use: Low Risk  (12/27/2022)   SDOH Interventions:     Readmission Risk Interventions     No data to display

## 2023-01-08 NOTE — Progress Notes (Signed)
Mobility Specialist Progress Note:   01/08/23 1300  Mobility  Activity Ambulated with assistance in hallway  Level of Assistance Modified independent, requires aide device or extra time  Assistive Device Four wheel walker  Distance Ambulated (ft) 500 ft  RUE Weight Bearing NWB  LUE Weight Bearing NWB  Activity Response Tolerated well  Mobility Referral Yes  $Mobility charge 1 Mobility  Mobility Specialist Start Time (ACUTE ONLY) 1325  Mobility Specialist Stop Time (ACUTE ONLY) 1339  Mobility Specialist Time Calculation (min) (ACUTE ONLY) 14 min    Pre Mobility: 73 HR During Mobility: 91 HR Post Mobility:  83 HR  Pt received in bed, agreeable to mobility. MinA for supine to sit. Asymptomatic throughout w/ stable HR. Pt left on EOB with call bell and all needs met.  D'Vante Earlene Plater Mobility Specialist Please contact via Special educational needs teacher or Rehab office at [phone]

## 2023-01-08 NOTE — Progress Notes (Signed)
Patient ID: [name], female   DOB: May 24, 1961, 62 y.o.   MRN: [id number] TCTS Evening Rounds:  Hemodynamically stable in sinus rhythm 79.  Transferred back to ICU today for severe bradycardia episode. EP has seen and stopped BB and amio. Continuing observation.

## 2023-01-08 NOTE — Progress Notes (Addendum)
[address]       [place] [place] [address]             [phone]      7 Days Post-Op Procedure(s) (LRB): CORONARY ARTERY BYPASS GRAFTING (CABG) TIMES THREE USING THE LEFT INTERNAL MAMMARY ARTERY (LIMA) AND ENDOSCOPICALLY HARVESTED RIGHT GREATER SAPHENOUS VEIN (N/A) TRANSESOPHAGEAL ECHOCARDIOGRAM (N/A) Subjective: Pt states she feels fine this AM, had some nausea yesterday but that has resolved. Pt walked 4 times yesterday.  Objective: Vital signs in last 24 hours: Temp:  [97.8 F (36.6 C)-98.4 F (36.9 C)] 98.4 F (36.9 C) (07/29 2013) Pulse Rate:  [72-89] 74 (07/29 2347) Cardiac Rhythm: Normal sinus rhythm (07/29 1900) Resp:  [14-19] 16 (07/30 0347) BP: (102-147)/(52-70) 102/52 (07/30 0347) SpO2:  [96 %-100 %] 99 % (07/30 0347) Weight:  [89.4 kg] 89.4 kg (07/30 0446)  Hemodynamic parameters for last 24 hours:    Intake/Output from previous day: No intake/output data recorded. Intake/Output this shift: No intake/output data recorded.  General appearance: alert, cooperative, and no distress Neurologic: intact Heart: regular rate and rhythm, no murmur Lungs: Diminished bibasilar Abdomen: soft, non-tender; bowel sounds normal; no masses,  no organomegaly Extremities: extremities normal, atraumatic, no cyanosis or edema Wound: Clean and dry, no erythema or sign of infection  Lab Results: Recent Labs    01/07/23 0543  WBC 7.6  HGB 10.5*  HCT 31.0*  PLT 241   BMET:  Recent Labs    01/07/23 0543  NA 137  K 3.5  CL 106  CO2 22  GLUCOSE 165*  BUN 15  CREATININE 0.95  CALCIUM 9.1    PT/INR: No results for input(s): "LABPROT", "INR" in the last 72 hours. ABG    Component Value Date/Time   PHART 7.366 01/02/2023 0334   HCO3 19.4 (L) 01/02/2023 0334   TCO2 20 (L) 01/02/2023 0334   ACIDBASEDEF 5.0 (H) 01/02/2023 0334   O2SAT 96 01/02/2023 0334   CBG (last 3)  Recent Labs    01/07/23 2017 01/07/23 2350 01/08/23 0544  GLUCAP 132*  161* 147*    Assessment/Plan: S/P Procedure(s) (LRB): CORONARY ARTERY BYPASS GRAFTING (CABG) TIMES THREE USING THE LEFT INTERNAL MAMMARY ARTERY (LIMA) AND ENDOSCOPICALLY HARVESTED RIGHT GREATER SAPHENOUS VEIN (N/A) TRANSESOPHAGEAL ECHOCARDIOGRAM (N/A)  Neuro: Hx of scattered embolic appearing infarcts, neurology recommended Plavix. Pt is allergic to ASA. Will follow up with neurology as an outpatient. Pain controlled this AM.   CV: Beta blocker held due to sinus pauses, low dose propranolol restarted yesterday due to short bursts of SVT/possible afib, will transition propranolol to lopressor. Pt in NSR, HR 70s-80s with often bursts of SVT rates into the 150s. BP 102-128.   Pulm: Saturating 100% on RA. Last CXR with atelectasis. Encourage IS and ambulation.    GI: +BM, no more nausea. OK appetite.   Endo: CBGs 132/161/147. On Metformin 500mg  BID, glipizide 10mg  daily, Semglee 10U daily, mealtime insulin, and SSI. Will restart Rybelsus at discharge. Preop A1C 8.6, will need close outpatient follow up.    Renal: Cr 0.95, no UO recorded. Pt voiding regularly. Under preop weight. Diuretics have been d/c'd. K supplemented yesterday.   Expected postop ABLA: Last H/H 10.5/31, stable. Thrombocytopenia resolved.   Dispo: Pt lives alone and will have no help at home. SNF not available for pt due to how well she is progressing. Plan to d/c to home when ready, will try to get home health. Will discuss if Dr. Cliffton Asters would like to  continue to monitor her and/or start Amiodarone due to continued short SVT runs.    LOS: 12 days    Jenny Reichmann, PA-C 01/08/2023   Another run of severe bradycardia.  Has also had several bursts of SVT.   EP consulted, will transfer to ICU for closer monitoring  Maidie Streight O Encarnacion Bole

## 2023-01-08 NOTE — Consult Note (Signed)
ELECTROPHYSIOLOGY CONSULT NOTE    Patient ID: JUDIE MEAN MRN: [id number], DOB/AGE: Jan 13, 1961 62 y.o.  Admit date: 12/27/2022 Date of Consult: 01/08/2023  Primary Physician: Bennie Pierini, FNP Primary Cardiologist: None  Electrophysiologist: New   Referring Provider: Dr. Cliffton Asters  Patient Profile: [name] is a 62 y.o. female with a history of HTN, HLD, DM2, and CAD s/p CABG being seen today for the evaluation of SVT vs AFL and intermittent AV block at the request of Dr. Cliffton Asters.  HPI:  [name] is a 62 y.o. female pt with medical history as above.   Admitted 7/18 with CP and near syncope. Labs show troponin 38 > 34, BNP 46.8, WBC 10.5, hemoglobin 11.8, platelets 204,000, sodium 135, potassium 3.8, bicarb 17, BUN 27, creatinine 1.19, serum glucose 294, WBC 10.5, hemoglobin 11.8, platelets 204,000.   Pt underwent cardiac catheterization 12/28/2022 and was found to have severe multivessel CAD. Patient is treated with multivessel CABG 01/01/2023 with a LIMA to LAD, saphenous vein graft to PDA, and saphenous vein graft obtuse marginal.   Post op course has been stable, apart from brief bursts of SVT vs AFL, none sustained. Treated with propanolol -> lopressor + amiodarone.  This am, pt was bent over bathing herself when she felt severe dizziness. RN was called and monitor showed a brief period of CHB with escape as low down into the 20s. EP consulted for recommendations.   Currently, pt is feeling OK. She had an episode of dizziness and called the nurse between 1130 - 1200, correlating with a period of CHB > Junctional bradycardia in the 20-30s > back to NSR on the monitor. No h/o syncope or bradycardia by her recollection. Currently she is a little fatigued when getting around the room, but otherwise denies SOB, CP or orthopnea. No edema.    Labs Potassium3.5 (07/29 0543)   Creatinine, ser  0.95 (07/29 0543) PLT  241 (07/29 0543) HGB   10.5* (07/29 0543) WBC 7.6 (07/29 0543)  .     Allergies, Medical, Surgical, Social, and Family Histories have been reviewed and are referenced here-in when relevant for medical decision making.    Inpatient Medications:   atorvastatin  80 mg Oral Daily   bisacodyl  10 mg Oral Daily   Or   bisacodyl  10 mg Rectal Daily   clopidogrel  75 mg Oral Daily   docusate sodium  200 mg Oral BID   enoxaparin (LOVENOX) injection  40 mg Subcutaneous QHS   glipiZIDE  10 mg Oral Q breakfast   insulin aspart  0-24 Units Subcutaneous TID WC   insulin aspart  2 Units Subcutaneous TID WC   insulin glargine-yfgn  10 Units Subcutaneous QHS   losartan  25 mg Oral Daily   magnesium oxide  400 mg Oral BID   metFORMIN  500 mg Oral BID WC   pantoprazole  40 mg Oral Daily   polyethylene glycol  17 g Oral BID   topiramate  50 mg Oral BID    Allergies:  Allergies  Allergen Reactions   Aspirin Anaphylaxis   Bee Venom Anaphylaxis   Benadryl [Diphenhydramine Hcl] Anaphylaxis   Morphine And Codeine Shortness Of Breath and Itching   Vicks Formula 44 Cough-Cold Pm [Dm-Apap-Cpm] Shortness Of Breath and Rash    Not anaphylaxis    History reviewed. No pertinent family history.   Physical Exam: Vitals:   01/08/23 0446 01/08/23 0737 01/08/23 0900 01/08/23 1135  BP:  118/66  97/73  Pulse:  69  66  Resp:  15  19  Temp:   98.4 F (36.9 C) 98 F (36.7 C)  TempSrc:  Oral Oral Oral  SpO2:  97%  96%  Weight: 89.4 kg     Height:        GEN- NAD, A&O x 3, normal affect HEENT: Normocephalic, atraumatic Lungs- CTAB, Normal effort.  Heart- Regular rate and rhythm, No M/G/R.  GI- Soft, NT, ND.  Extremities- No clubbing, cyanosis, or edema   Radiology/Studies:   EKG:on arrival  7/18 showed NSR without conduction disease (personally reviewed)  TELEMETRY: NSR 60-70s, this am ~ 1140 pt had a sinus pause (? Minimal R-R prolongation preceding) followed by CHB and likely junctional rhythm in 20-30s prior to  returning gradually to NSR. (personally reviewed)  Assessment/Plan: Advanced AV block with Intermittent CHB ? Vagal component as she was bent over bathing herself when  No conduction disease at baseline. Received proponalol 10 mg BID yesterday, then 12.5 lopressor and 200 mg of amiodarone this am ~ 0900 Would hold all AV nodal agents at this time and follow  If has further CHB, May need to consider pacing If no further, would be OK for home tomorrow with Zio.   Brief SVT Would follow for now off BB as she convalesces   CAD s/p CABG Overall stable post op course apart from above.   For questions or updates, please contact [facility] Please consult [email or web address] for contact info under Cardiology/STEMI.  Dustin Flock, PA-C  01/08/2023 3:44 PM

## 2023-01-09 DIAGNOSIS — I459 Conduction disorder, unspecified: Secondary | ICD-10-CM | POA: Diagnosis not present

## 2023-01-09 DIAGNOSIS — I471 Supraventricular tachycardia, unspecified: Secondary | ICD-10-CM

## 2023-01-09 LAB — GLUCOSE, CAPILLARY
Glucose-Capillary: 150 mg/dL — ABNORMAL HIGH (ref 70–99)
Glucose-Capillary: 111 mg/dL — ABNORMAL HIGH (ref 70–99)
Glucose-Capillary: 107 mg/dL — ABNORMAL HIGH (ref 70–99)
Glucose-Capillary: 162 mg/dL — ABNORMAL HIGH (ref 70–99)

## 2023-01-09 LAB — MAGNESIUM: Magnesium: 1.3 mg/dL — ABNORMAL LOW (ref 1.7–2.4)

## 2023-01-09 MED ORDER — POTASSIUM CHLORIDE CRYS ER 20 MEQ PO TBCR
40.0000 meq | EXTENDED_RELEASE_TABLET | Freq: Once | ORAL | Status: AC
  Administered 2023-01-09: 40 meq via ORAL
  Filled 2023-01-09: qty 2

## 2023-01-09 MED ORDER — MAGNESIUM SULFATE 2 GM/50ML IV SOLN
2.0000 g | Freq: Once | INTRAVENOUS | Status: AC
  Administered 2023-01-09: 2 g via INTRAVENOUS
  Filled 2023-01-09: qty 50

## 2023-01-09 MED ORDER — MAGNESIUM SULFATE 4 GM/100ML IV SOLN
4.0000 g | Freq: Once | INTRAVENOUS | Status: AC
  Administered 2023-01-09: 4 g via INTRAVENOUS
  Filled 2023-01-09: qty 100

## 2023-01-09 MED FILL — Sodium Chloride IV Soln 0.9%: INTRAVENOUS | Qty: 3000 | Status: AC

## 2023-01-09 MED FILL — Electrolyte-R (PH 7.4) Solution: INTRAVENOUS | Qty: 4000 | Status: AC

## 2023-01-09 MED FILL — Mannitol IV Soln 20%: INTRAVENOUS | Qty: 500 | Status: AC

## 2023-01-09 MED FILL — Sodium Bicarbonate IV Soln 8.4%: INTRAVENOUS | Qty: 150 | Status: AC

## 2023-01-09 MED FILL — Heparin Sodium (Porcine) Inj 1000 Unit/ML: INTRAMUSCULAR | Qty: 20 | Status: AC

## 2023-01-09 MED FILL — Calcium Chloride Inj 10%: INTRAVENOUS | Qty: 10 | Status: AC

## 2023-01-09 NOTE — Progress Notes (Addendum)
[facility] DAILY ICU PROGRESS NOTE                   [address]            [place] [address]          [phone]   8 Days Post-Op Procedure(s) (LRB): CORONARY ARTERY BYPASS GRAFTING (CABG) TIMES THREE USING THE LEFT INTERNAL MAMMARY ARTERY (LIMA) AND ENDOSCOPICALLY HARVESTED RIGHT GREATER SAPHENOUS VEIN (N/A) TRANSESOPHAGEAL ECHOCARDIOGRAM (N/A)  Total Length of Stay:  LOS: 13 days   Subjective: Nausea this am  Objective: Vital signs in last 24 hours: Temp:  [98 F (36.7 C)-98.8 F (37.1 C)] 98.8 F (37.1 C) (07/31 0313) Pulse Rate:  [66-98] 98 (07/31 0600) Cardiac Rhythm: Normal sinus rhythm (07/31 0400) Resp:  [15-21] 21 (07/31 0600) BP: (96-134)/(45-103) 119/103 (07/31 0600) SpO2:  [91 %-98 %] 98 % (07/31 0600) Weight:  [89.9 kg] 89.9 kg (07/31 0500)  Filed Weights   01/07/23 0409 01/08/23 0446 01/09/23 0500  Weight: 90.2 kg 89.4 kg 89.9 kg    Weight change: 0.5 kg   Hemodynamic parameters for last 24 hours:    Intake/Output from previous day: 07/30 0701 - 07/31 0700 In: 480 [P.O.:480] Out: 200 [Urine:200]  Intake/Output this shift: No intake/output data recorded.  Current Meds: Scheduled Meds:  atorvastatin  80 mg Oral Daily   bisacodyl  10 mg Oral Daily   Or   bisacodyl  10 mg Rectal Daily   Chlorhexidine Gluconate Cloth  6 each Topical Daily   clopidogrel  75 mg Oral Daily   docusate sodium  200 mg Oral BID   enoxaparin (LOVENOX) injection  40 mg Subcutaneous QHS   glipiZIDE  10 mg Oral Q breakfast   insulin aspart  0-24 Units Subcutaneous TID WC   insulin aspart  2 Units Subcutaneous TID WC   insulin glargine-yfgn  10 Units Subcutaneous QHS   losartan  25 mg Oral Daily   magnesium oxide  400 mg Oral BID   metFORMIN  500 mg Oral BID WC   pantoprazole  40 mg Oral Daily   polyethylene glycol  17 g Oral BID   topiramate  50 mg Oral BID   Continuous Infusions:  albumin human Stopped (01/02/23 0018)   lactated ringers      lactated ringers Stopped (01/02/23 1055)   PRN Meds:.albumin human, dextrose, metoprolol tartrate, midazolam, ondansetron (ZOFRAN) IV, mouth rinse, oxyCODONE, traMADol  General appearance: alert, cooperative, fatigued, and no distress Heart: regular rate and rhythm Lungs: mildly dim in bases Abdomen: benign Extremities: no edema Wound: incis healing well  Lab Results: CBC: Recent Labs    01/07/23 0543  WBC 7.6  HGB 10.5*  HCT 31.0*  PLT 241   BMET:  Recent Labs    01/07/23 0543  NA 137  K 3.5  CL 106  CO2 22  GLUCOSE 165*  BUN 15  CREATININE 0.95  CALCIUM 9.1    CMET: Lab Results  Component Value Date   WBC 7.6 01/07/2023   HGB 10.5 (L) 01/07/2023   HCT 31.0 (L) 01/07/2023   PLT 241 01/07/2023   GLUCOSE 165 (H) 01/07/2023   CHOL 261 (H) 12/28/2022   TRIG 594 (H) 12/28/2022   HDL 38 (L) 12/28/2022   LDLDIRECT 136 (H) 12/28/2022   LDLCALC UNABLE TO CALCULATE IF TRIGLYCERIDE OVER 400 mg/dL 91/47/8295   ALT 23 62/13/0865   AST 19 12/29/2022   NA 137 01/07/2023   K 3.5 01/07/2023  CL 106 01/07/2023   CREATININE 0.95 01/07/2023   BUN 15 01/07/2023   CO2 22 01/07/2023   TSH 2.600 07/23/2022   INR 1.3 (H) 01/01/2023   HGBA1C 8.6 (H) 12/31/2022   MICROALBUR 50 06/25/2013      PT/INR: No results for input(s): "LABPROT", "INR" in the last 72 hours. Radiology: No results found.   Assessment/Plan: S/P Procedure(s) (LRB): CORONARY ARTERY BYPASS GRAFTING (CABG) TIMES THREE USING THE LEFT INTERNAL MAMMARY ARTERY (LIMA) AND ENDOSCOPICALLY HARVESTED RIGHT GREATER SAPHENOUS VEIN (N/A) TRANSESOPHAGEAL ECHOCARDIOGRAM (N/A)  1 afeb, VSS , transferred to ICU yesterday for Tachy/Brady issues- EP on board, beta blockers and amio have been discontinued. Conts to have episodes of afib w/RVR but maintaining sinus since about 10 pm last night 2 O2 sats good on RA 3 weight stable 4 BS adeq control on current RX- poor control at home 5 no new labs or CXR's, checking  BMET/Mg++ now 6 routine pulm hygiene and rehab    Rowe Clack PA-C Pager [phone] 01/09/2023 7:21 AM  Agree with above Will watch HR.  Stopping all nodal agents Appreciate EP Dispo planning  Wilena Tyndall O Khamani Fairley

## 2023-01-09 NOTE — Evaluation (Signed)
Physical Therapy Evaluation Patient Details Name: [name] MRN: [id number] DOB: July 29, 1960 Today's Date: 01/09/2023  History of Present Illness  Pt is 62 year old presented to [facility] on  12/27/22 for chest pain. Underwent CABG x 3 on 01/01/23. Transferred back to ICU on 7/30 due to  PMH - DM, HTN, anxiety, migraines   Clinical Impression  Pt transferred back to ICU due to bradycardia episode. Pt with c/o nausea this date but eager to mobilize. This PT mimic'd home set up and placed HOB flat, pt able to log roll however required assist for trunk elevation to sit up on EOB. Discussed at length her precautions and what limitations she will have in her home and will needs assist with ie. Driving, going to MD appts, laundry, walking the dog. Pt appreciative of education however reluctant to ask for assist. Acute PT to cont to follow to achieve safe indep function while adhering to sternal precautions.        If plan is discharge home, recommend the following: Assist for transportation;Assistance with cooking/housework   Can travel by Nurse, mental health (4 wheels);BSC/3in1  Recommendations for Other Services       Functional Status Assessment Patient has had a recent decline in their functional status and demonstrates the ability to make significant improvements in function in a reasonable and predictable amount of time.     Precautions / Restrictions Precautions Precautions: Sternal Restrictions Weight Bearing Restrictions: Yes (sternal precautions) RUE Weight Bearing: Non weight bearing LUE Weight Bearing: Non weight bearing      Mobility  Bed Mobility Overal bed mobility: Needs Assistance Bed Mobility: Sidelying to Sit, Rolling Rolling: Modified independent (Device/Increase time) Sidelying to sit: Min assist       General bed mobility comments: minA for trunk elevation with HOB flat to mimic home set up. Pt reports "you'll have  to help me, I can't get up from this bed". pt reports not having a recliner in home to sleep in either    Transfers Overall transfer level: Modified independent Equipment used: None                    Ambulation/Gait Ambulation/Gait assistance: Modified independent (Device/Increase time) Gait Distance (Feet): 400 Feet Assistive device: Rollator (4 wheels), None Gait Pattern/deviations: Decreased stride length Gait velocity: decr Gait velocity interpretation: 1.31 - 2.62 ft/sec, indicative of limited community ambulator   General Gait Details: Steady gait with and without assistive device. Feels more comfortable with rollator  Stairs            Wheelchair Mobility     Tilt Bed    Modified Rankin (Stroke Patients Only)       Balance Overall balance assessment: No apparent balance deficits (not formally assessed)                                           Pertinent Vitals/Pain Pain Assessment Pain Assessment: No/denies pain    Home Living Family/patient expects to be discharged to:: Private residence Living Arrangements: Alone Available Help at Discharge: Neighbor;Friend(s);Available PRN/intermittently Type of Home: Apartment Home Access: Level entry       Home Layout: One level Home Equipment: None      Prior Function Prior Level of Function : Independent/Modified Independent;Driving  Mobility Comments: 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 ADLs Comments: indep     Hand Dominance   Dominant Hand: Right    Extremity/Trunk Assessment   Upper Extremity Assessment Upper Extremity Assessment: Defer to OT evaluation    Lower Extremity Assessment Lower Extremity Assessment: Overall WFL for tasks assessed    Cervical / Trunk Assessment Cervical / Trunk Assessment: Other exceptions Cervical / Trunk Exceptions: sternal incision  Communication   Communication: No difficulties   Cognition Arousal/Alertness: Awake/alert Behavior During Therapy: WFL for tasks assessed/performed Overall Cognitive Status: Within Functional Limits for tasks assessed                                          General Comments General comments (skin integrity, edema, etc.): VSS on RA, pt with c/o nausea this morning, received zofran and ate a saltine    Exercises     Assessment/Plan    PT Assessment Patient does not need any further PT services  PT Problem List         PT Treatment Interventions      PT Goals (Current goals can be found in the Care Plan section)  Acute Rehab PT Goals Patient Stated Goal: home PT Goal Formulation: With patient Time For Goal Achievement: 01/22/23 Potential to Achieve Goals: Good    Frequency       Co-evaluation               AM-PAC PT "6 Clicks" Mobility  Outcome Measure Help needed turning from your back to your side while in a flat bed without using bedrails?: A Little Help needed moving from lying on your back to sitting on the side of a flat bed without using bedrails?: A Little Help needed moving to and from a bed to a chair (including a wheelchair)?: None Help needed standing up from a chair using your arms (e.g., wheelchair or bedside chair)?: None Help needed to walk in hospital room?: None Help needed climbing 3-5 steps with a railing? : A Little 6 Click Score: 21    End of Session   Activity Tolerance: Patient tolerated treatment well Patient left: in bed;with call bell/phone within reach (in chair position, pt deferred sitting in the chair due to chair not comfortable) Nurse Communication: Mobility status PT Visit Diagnosis: Difficulty in walking, not elsewhere classified (R26.2)    Time: 4098-1191 PT Time Calculation (min) (ACUTE ONLY): 36 min   Charges:   PT Evaluation $PT Eval Moderate Complexity: 1 Mod PT Treatments $Gait Training: 8-22 mins PT General Charges $$ ACUTE PT VISIT: 1  Visit         Lewis Shock, PT, DPT Acute Rehabilitation Services Secure chat preferred Office #: [phone]   Iona Hansen 01/09/2023, 10:40 AM

## 2023-01-09 NOTE — Progress Notes (Signed)
Patient Name: [name] Date of Encounter: 01/09/2023  Primary Cardiologist: None Electrophysiologist: None  Interval Summary   The patient is doing well today.    Some nausea this am. Continued to have brief episodes of SVT vs AFL overnight, but quiescent since ~ 2200.   Inpatient Medications    Scheduled Meds:  atorvastatin  80 mg Oral Daily   bisacodyl  10 mg Oral Daily   Or   bisacodyl  10 mg Rectal Daily   Chlorhexidine Gluconate Cloth  6 each Topical Daily   clopidogrel  75 mg Oral Daily   docusate sodium  200 mg Oral BID   enoxaparin (LOVENOX) injection  40 mg Subcutaneous QHS   glipiZIDE  10 mg Oral Q breakfast   insulin aspart  0-24 Units Subcutaneous TID WC   insulin aspart  2 Units Subcutaneous TID WC   insulin glargine-yfgn  10 Units Subcutaneous QHS   losartan  25 mg Oral Daily   magnesium oxide  400 mg Oral BID   metFORMIN  500 mg Oral BID WC   pantoprazole  40 mg Oral Daily   polyethylene glycol  17 g Oral BID   topiramate  50 mg Oral BID   Continuous Infusions:  albumin human Stopped (01/02/23 0018)   lactated ringers     lactated ringers Stopped (01/02/23 1055)   PRN Meds: albumin human, dextrose, metoprolol tartrate, midazolam, ondansetron (ZOFRAN) IV, mouth rinse, oxyCODONE, traMADol   Vital Signs    Vitals:   01/09/23 0400 01/09/23 0500 01/09/23 0600 01/09/23 0740  BP: (!) 116/54 (!) 111/55 (!) 119/103   Pulse: 76 82 98   Resp: 16 15 (!) 21   Temp:    97.9 F (36.6 C)  TempSrc:    Oral  SpO2: 94% 94% 98%   Weight:  89.9 kg    Height:        Intake/Output Summary (Last 24 hours) at 01/09/2023 0903 Last data filed at 01/08/2023 1855 Gross per 24 hour  Intake 240 ml  Output 200 ml  Net 40 ml   Filed Weights   01/07/23 0409 01/08/23 0446 01/09/23 0500  Weight: 90.2 kg 89.4 kg 89.9 kg    Physical Exam    GEN- The patient is well appearing, alert and oriented x 3 today.   Lungs- Clear to ausculation bilaterally, normal  work of breathing Cardiac- Regular rate and rhythm, no murmurs, rubs or gallops GI- soft, NT, ND, + BS Extremities- no clubbing or cyanosis. No edema  Telemetry    NSR 70-90s, occasional brief SVT/AFL RVR (personally reviewed)  Hospital Course    [name] is a 62 y.o. female with a history of HTN, HLD, DM2, and CAD s/p CABG being seen today for the evaluation of SVT vs AFL and intermittent AV block at the request of Dr. Cliffton Asters.   Assessment & Plan    Advanced AV block with Intermittent CHB ? Vagal component as she was bent over bathing herself when  No conduction disease at baseline. Received proponalol 10 mg BID 7/29, then 12.5 lopressor and 200 mg of amiodarone 7/30 ~ 0900 prior to the episode Would hold all AV nodal agents at this time and follow  If has further CHB, May need to consider pacing No bradycardia overnight.  Could potentially discharge from our perspective today.  Would place Zio on discharge, whether today or tomorrow.    Brief SVT Would follow for now off BB as she convalesces  CAD s/p CABG Overall stable post op course apart from above.   For questions or updates, please contact [facility] Please consult [email or web address] for contact info under Cardiology/STEMI.  Signed, Graciella Freer, PA-C  01/09/2023, 9:03 AM

## 2023-01-09 NOTE — Progress Notes (Signed)
CARDIAC REHAB PHASE I   Pt just returned from walk in hall with ICU staff. Tolerated well with no SOB, dizziness or pain. Nausea much better now, not much of an appetite. Encouraged small light snacks and protein shakes for  today. Pt ambulating well and using IS frequently. Education for home completed, no questions or concerns. Will continue to follow.   [phone]  Woodroe Chen, RN BSN 01/09/2023 10:16 AM

## 2023-01-09 NOTE — Progress Notes (Signed)
      [address]       [place] [address]             [phone]      POD # 8 CABG  Resting comfortably  BP 137/62   Pulse 77   Temp 97.7 F (36.5 C) (Axillary)   Resp 16   Ht 5\' 4"  (1.626 m)   Wt 89.9 kg   SpO2 99%   BMI 34.02 kg/m  Currently 80 in SR   Intake/Output Summary (Last 24 hours) at 01/09/2023 1712 Last data filed at 01/08/2023 1855 Gross per 24 hour  Intake --  Output 200 ml  Net -200 ml   Continue current care  Shavonn Convey C. Dorris Fetch, MD [facility] [phone]

## 2023-01-09 NOTE — Evaluation (Signed)
Occupational Therapy Evaluation and Discharge Patient Details Name: [name] MRN: [id number] DOB: 1961-05-29 Today's Date: 01/09/2023   History of Present Illness Pt is 62 year old presented to [facility] on  12/27/22 for chest pain. Underwent CABG x 3 on 01/01/23. Transferred back to ICU on 7/30 due to  PMH - DM, HTN, anxiety, migraines   Clinical Impression   Pt is functioning mod I in self care and OOB mobility. All education completed as detailed below with pt verbalizing understanding. No further OT needs.      Recommendations for follow up therapy are one component of a multi-disciplinary discharge planning process, led by the attending physician.  Recommendations may be updated based on patient status, additional functional criteria and insurance authorization.   Assistance Recommended at Discharge Intermittent Supervision/Assistance  Patient can return home with the following Assistance with cooking/housework;Assist for transportation;Help with stairs or ramp for entrance    Functional Status Assessment  Patient has had a recent decline in their functional status and demonstrates the ability to make significant improvements in function in a reasonable and predictable amount of time.  Equipment Recommendations  BSC/3in1    Recommendations for Other Services       Precautions / Restrictions Precautions Precautions: Sternal Precaution Comments: educated in "move in the tube" no push/pulling or lifting Restrictions Weight Bearing Restrictions: No RUE Weight Bearing: Non weight bearing LUE Weight Bearing: Non weight bearing      Mobility Bed Mobility Overal bed mobility: Needs Assistance Bed Mobility: Rolling, Sidelying to Sit Rolling: Modified independent (Device/Increase time) Sidelying to sit: Supervision       General bed mobility comments: increased time    Transfers Overall transfer level: Modified independent Equipment used: Rollator (4 wheels)                       Balance                                           ADL either performed or assessed with clinical judgement   ADL Overall ADL's : Modified independent                                       General ADL Comments: Educated pt in benefits of long handled bath sponge and reacher. Pt able to perform figure 4 to reach feet to don socks. Recommended seated showering. Educated in multiple uses of 3 in 1, pt will need to rely on her friend to move it for her. Instructed in energy conservation strategies and IADLs to avoid.     Vision Baseline Vision/History: 1 Wears glasses Ability to See in Adequate Light: 0 Adequate       Perception     Praxis      Pertinent Vitals/Pain Pain Assessment Pain Assessment: No/denies pain     Hand Dominance Right   Extremity/Trunk Assessment Upper Extremity Assessment Upper Extremity Assessment: Overall WFL for tasks assessed   Lower Extremity Assessment Lower Extremity Assessment: Defer to PT evaluation   Cervical / Trunk Assessment Cervical / Trunk Assessment: Other exceptions Cervical / Trunk Exceptions: sternal incision   Communication Communication Communication: No difficulties   Cognition Arousal/Alertness: Awake/alert Behavior During Therapy: WFL for tasks assessed/performed Overall Cognitive Status: Within Functional Limits for tasks assessed  General Comments  VSS on RA, pt with c/o nausea this morning, received zofran and ate a saltine    Exercises     Shoulder Instructions      Home Living Family/patient expects to be discharged to:: Private residence Living Arrangements: Alone Available Help at Discharge: Neighbor;Friend(s);Available PRN/intermittently Type of Home: Apartment Home Access: Level entry     Home Layout: One level         Bathroom Toilet: Standard     Home Equipment: None   Additional  Comments: RW and BSC in room      Prior Functioning/Environment Prior Level of Function : Independent/Modified Independent;Driving             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 ADLs Comments: indep        OT Problem List:        OT Treatment/Interventions:      OT Goals(Current goals can be found in the care plan section) Acute Rehab OT Goals OT Goal Formulation: With patient  OT Frequency:      Co-evaluation              AM-PAC OT "6 Clicks" Daily Activity     Outcome Measure Help from another person eating meals?: None Help from another person taking care of personal grooming?: None Help from another person toileting, which includes using toliet, bedpan, or urinal?: None Help from another person bathing (including washing, rinsing, drying)?: None Help from another person to put on and taking off regular upper body clothing?: None Help from another person to put on and taking off regular lower body clothing?: None 6 Click Score: 24   End of Session Equipment Utilized During Treatment: Rollator (4 wheels)  Activity Tolerance: Patient tolerated treatment well Patient left: in bed;with call bell/phone within reach  OT Visit Diagnosis: Muscle weakness (generalized) (M62.81)                Time: 2952-8413 OT Time Calculation (min): 31 min Charges:  OT General Charges $OT Visit: 1 Visit OT Evaluation $OT Eval Low Complexity: 1 Low OT Treatments $Self Care/Home Management : 8-22 mins Berna Spare, OTR/L Acute Rehabilitation Services Office: [phone]  Evern Bio 01/09/2023, 1:45 PM

## 2023-01-10 ENCOUNTER — Inpatient Hospital Stay (HOSPITAL_COMMUNITY): Payer: 59

## 2023-01-10 LAB — GLUCOSE, CAPILLARY
Glucose-Capillary: 135 mg/dL — ABNORMAL HIGH (ref 70–99)
Glucose-Capillary: 104 mg/dL — ABNORMAL HIGH (ref 70–99)
Glucose-Capillary: 139 mg/dL — ABNORMAL HIGH (ref 70–99)
Glucose-Capillary: 138 mg/dL — ABNORMAL HIGH (ref 70–99)

## 2023-01-10 NOTE — Progress Notes (Signed)
   01/10/23 2053  BiPAP/CPAP/SIPAP  Reason BIPAP/CPAP not in use Non-compliant  BiPAP/CPAP /SiPAP Vitals  Bilateral Breath Sounds Clear;Diminished   Rt attempted to place pt on CPAP machine, pt did not tolerate Nasal Mask, states she is claustrophobic. CPAP at bedside if pt would like to try again.

## 2023-01-10 NOTE — TOC Progression Note (Signed)
Transition of Care [facility]) - Progression Note    Patient Details  Name: [name] MRN: [id number] Date of Birth: [birth year]  Transition of Care [facility]) CM/SW Contact  Graves-Bigelow, Lamar Laundry, RN Phone Number: 01/10/2023, 11:28 AM  Clinical Narrative: Patient was discussed in progression rounds regarding possible need for home CPAP. Case Manager did make Staff RN and MD aware that patient will need a sleep study to qualify for CPAP. Case Manager did call [employer] to see if the patient may qualify for NIV vs BIPAP for home-awaiting call back.   Expected Discharge Plan: Home/Self Care Barriers to Discharge: Continued Medical Work up  Expected Discharge Plan and Services   Discharge Planning Services: CM Consult Post Acute Care Choice: Durable Medical Equipment Living arrangements for the past 2 months: Single Family Home                 DME Arranged: Bedside commode, Walker rolling with seat DME Agency: [employer] Date DME Agency Contacted: 01/07/23 Time DME Agency Contacted: 1500 Representative spoke with at DME Agency: Vaughan Basta HH Arranged: NA HH Agency: NA  Social Determinants of Health (SDOH) Interventions SDOH Screenings   Food Insecurity: No Food Insecurity (12/28/2022)  Recent Concern: Food Insecurity - Food Insecurity Present (11/23/2022)  Housing: Low Risk  (12/28/2022)  Transportation Needs: No Transportation Needs (12/28/2022)  Utilities: Not At Risk (12/28/2022)  Alcohol Screen: Low Risk  (11/23/2022)  Depression (PHQ2-9): Low Risk  (11/26/2022)  Financial Resource Strain: Low Risk  (12/24/2022)   Received from [facility]  Recent Concern: Financial Resource Strain - High Risk (11/23/2022)  Physical Activity: Insufficiently Active (11/23/2022)  Social Connections: Moderately Integrated (11/23/2022)  Stress: No Stress Concern Present (11/23/2022)  Tobacco Use: Low Risk  (12/27/2022)    Readmission Risk Interventions     No data to display

## 2023-01-10 NOTE — Progress Notes (Addendum)
9 Days Post-Op Procedure(s) (LRB): CORONARY ARTERY BYPASS GRAFTING (CABG) TIMES THREE USING THE LEFT INTERNAL MAMMARY ARTERY (LIMA) AND ENDOSCOPICALLY HARVESTED RIGHT GREATER SAPHENOUS VEIN (N/A) TRANSESOPHAGEAL ECHOCARDIOGRAM (N/A) Subjective: Feels generally well, does get palpitations w/HR into 150's  Objective: Vital signs in last 24 hours: Temp:  [97.7 F (36.5 C)-98.7 F (37.1 C)] 98 F (36.7 C) (08/01 0337) Pulse Rate:  [63-92] 81 (08/01 0700) Cardiac Rhythm: Sinus tachycardia;Atrial fibrillation;Supraventricular tachycardia;Normal sinus rhythm (08/01 0400) Resp:  [11-25] 21 (08/01 0700) BP: (92-149)/(47-81) 127/56 (08/01 0700) SpO2:  [90 %-99 %] 94 % (08/01 0700) Weight:  [88.6 kg] 88.6 kg (08/01 0500)  Hemodynamic parameters for last 24 hours:    Intake/Output from previous day: 07/31 0701 - 08/01 0700 In: 470.5 [P.O.:120; IV Piggyback:350.5] Out: 7 [Urine:3; Stool:4] Intake/Output this shift: No intake/output data recorded.  General appearance: alert, cooperative, and no distress Heart: regular rate and rhythm Lungs: mildly dim in bases Abdomen: benign Extremities: minor edema Wound: incis healing well without signs of infection  Lab Results: Recent Labs    01/10/23 0011  WBC 11.2*  HGB 10.2*  HCT 31.5*  PLT 349   BMET:  Recent Labs    01/09/23 0736 01/10/23 0011  NA 134* 135  K 3.6 4.1  CL 104 105  CO2 19* 20*  GLUCOSE 152* 132*  BUN 15 14  CREATININE 0.77 0.90  CALCIUM 8.8* 8.7*    PT/INR: No results for input(s): "LABPROT", "INR" in the last 72 hours. ABG    Component Value Date/Time   PHART 7.366 01/02/2023 0334   HCO3 19.4 (L) 01/02/2023 0334   TCO2 20 (L) 01/02/2023 0334   ACIDBASEDEF 5.0 (H) 01/02/2023 0334   O2SAT 96 01/02/2023 0334   CBG (last 3)  Recent Labs    01/09/23 1614 01/09/23 2112 01/10/23 0623  GLUCAP 107* 162* 135*    Meds Scheduled Meds:  atorvastatin  80 mg Oral Daily   bisacodyl  10 mg Oral Daily   Or    bisacodyl  10 mg Rectal Daily   Chlorhexidine Gluconate Cloth  6 each Topical Daily   clopidogrel  75 mg Oral Daily   docusate sodium  200 mg Oral BID   enoxaparin (LOVENOX) injection  40 mg Subcutaneous QHS   glipiZIDE  10 mg Oral Q breakfast   insulin aspart  0-24 Units Subcutaneous TID WC   insulin aspart  2 Units Subcutaneous TID WC   insulin glargine-yfgn  10 Units Subcutaneous QHS   losartan  25 mg Oral Daily   magnesium oxide  400 mg Oral BID   metFORMIN  500 mg Oral BID WC   pantoprazole  40 mg Oral Daily   polyethylene glycol  17 g Oral BID   topiramate  50 mg Oral BID   Continuous Infusions:  albumin human Stopped (01/02/23 0018)   lactated ringers     lactated ringers Stopped (01/02/23 1055)   PRN Meds:.albumin human, dextrose, metoprolol tartrate, midazolam, ondansetron (ZOFRAN) IV, mouth rinse, oxyCODONE, traMADol  Xrays No results found.  Assessment/Plan: S/P Procedure(s) (LRB): CORONARY ARTERY BYPASS GRAFTING (CABG) TIMES THREE USING THE LEFT INTERNAL MAMMARY ARTERY (LIMA) AND ENDOSCOPICALLY HARVESTED RIGHT GREATER SAPHENOUS VEIN (N/A) TRANSESOPHAGEAL ECHOCARDIOGRAM (N/A)  1 afeb, s BP 90's-140's, having afib /SVT episodes upto 150's- EP assisting with management neg chronotropes have been stopped d/t brady issues so may need pacer but continuing to observe. Also being evaluated for poss sleep apnea 2 O2 sats good on 2 liters, does desat with  sleep 3 voiding well 4 + BM 5 normal renal fxn, normal potassium and MG++ 6 minor leukocytosis, WBC 11.2, will check CXR 7 H/H stable 8 pulm hygiene and rehab as able     LOS: 14 days    Rowe Clack PA-C Pager [phone] 01/10/2023   Agree with above Awaiting EP recommendations Will work on CPAP tonight to see if helps

## 2023-01-10 NOTE — Progress Notes (Signed)
   No further bradycardia.   Continue to have occasional short AT/SVT 100-140s. Would continue to monitor off BB as she tolerates fairly well.   We would recommend tolerating these short bursts as they will likely improve as she continues to heal.   She is OK for discharge from EP perspective with Live Zio monitoring.   Casimiro Needle [address] [place], PA-C  01/10/2023 9:24 AM

## 2023-01-11 ENCOUNTER — Other Ambulatory Visit (HOSPITAL_COMMUNITY): Payer: Self-pay

## 2023-01-11 ENCOUNTER — Inpatient Hospital Stay (HOSPITAL_BASED_OUTPATIENT_CLINIC_OR_DEPARTMENT_OTHER)
Admit: 2023-01-11 | Discharge: 2023-01-11 | Disposition: A | Discharge status: Home / Self Care | Payer: 59 | Attending: Surgical | Admitting: Surgical

## 2023-01-11 DIAGNOSIS — Z951 Presence of aortocoronary bypass graft: Secondary | ICD-10-CM | POA: Diagnosis not present

## 2023-01-11 DIAGNOSIS — I471 Supraventricular tachycardia, unspecified: Secondary | ICD-10-CM

## 2023-01-11 LAB — BASIC METABOLIC PANEL
Anion gap: 12 (ref 5–15)
BUN: 12 mg/dL (ref 8–23)
CO2: 18 mmol/L — ABNORMAL LOW (ref 22–32)
Calcium: 8.8 mg/dL — ABNORMAL LOW (ref 8.9–10.3)
Chloride: 105 mmol/L (ref 98–111)
Creatinine, Ser: 0.86 mg/dL (ref 0.44–1.00)
GFR, Estimated: >60 mL/min (ref >60)
Glucose, Bld: 128 mg/dL — ABNORMAL HIGH (ref 70–99)
Potassium: 4.1 mmol/L (ref 3.5–5.1)
Sodium: 135 mmol/L (ref 135–145)

## 2023-01-11 LAB — CBC
HCT: 30.4 % — ABNORMAL LOW (ref 36.0–46.0)
Hemoglobin: 10.1 g/dL — ABNORMAL LOW (ref 12.0–15.0)
MCH: 30.9 pg (ref 26.0–34.0)
MCHC: 33.2 g/dL (ref 30.0–36.0)
MCV: 93 fL (ref 80.0–100.0)
Platelets: 312 10*3/uL (ref 150–400)
RBC: 3.27 MIL/uL — ABNORMAL LOW (ref 3.87–5.11)
RDW: 12.8 % (ref 11.5–15.5)
WBC: 8.6 10*3/uL (ref 4.0–10.5)
nRBC: 0 % (ref 0.0–0.2)

## 2023-01-11 LAB — GLUCOSE, CAPILLARY
Glucose-Capillary: 146 mg/dL — ABNORMAL HIGH (ref 70–99)
Glucose-Capillary: 186 mg/dL — ABNORMAL HIGH (ref 70–99)

## 2023-01-11 LAB — MAGNESIUM: Magnesium: 1.8 mg/dL (ref 1.7–2.4)

## 2023-01-11 MED ORDER — LOSARTAN POTASSIUM 25 MG PO TABS
25.0000 mg | ORAL_TABLET | Freq: Every day | ORAL | 1 refills | Status: DC
  Filled 2023-01-11: qty 30, 30d supply, fill #0

## 2023-01-11 MED ORDER — TRAMADOL HCL 50 MG PO TABS
50.0000 mg | ORAL_TABLET | Freq: Four times a day (QID) | ORAL | 0 refills | Status: DC | PRN
  Filled 2023-01-11: qty 28, 7d supply, fill #0

## 2023-01-11 MED ORDER — MAGNESIUM OXIDE -MG SUPPLEMENT 400 (240 MG) MG PO TABS
400.0000 mg | ORAL_TABLET | Freq: Two times a day (BID) | ORAL | 0 refills | Status: DC
Start: 2023-01-11 — End: 2023-10-25
  Filled 2023-01-11: qty 120, 60d supply, fill #0

## 2023-01-11 MED ORDER — ATORVASTATIN CALCIUM 80 MG PO TABS
80.0000 mg | ORAL_TABLET | Freq: Every day | ORAL | 2 refills | Status: DC
  Filled 2023-01-11: qty 30, 30d supply, fill #0

## 2023-01-11 MED ORDER — CLOPIDOGREL BISULFATE 75 MG PO TABS
75.0000 mg | ORAL_TABLET | Freq: Every day | ORAL | 2 refills | Status: DC
  Filled 2023-01-11: qty 30, 30d supply, fill #0

## 2023-01-11 NOTE — TOC Transition Note (Signed)
Transition of Care [facility]) - CM/SW Discharge Note   Patient Details  Name: [name] MRN: [id number] Date of Birth: 1960-09-05  [facility] of Care [facility] CM/SW Contact:  Gala Lewandowsky, RN Phone Number: 01/11/2023, 9:32 AM   Clinical Narrative: Per Staff RN patient could not tolerate the CPAP overnight. No orders for respiratory needs. Patient will transition home today. Patient has DME bedside commode and rollator. No further needs identified.    Final next level of care: Home/Self Care Barriers to Discharge: No Barriers Identified Patient Goals and CMS Choice CMS Medicare.gov Compare Post Acute Care list provided to:: Patient Choice offered to / list presented to : Patient  Discharge Plan and Services Additional resources added to the After Visit Summary for     Discharge Planning Services: CM Consult Post Acute Care Choice: Durable Medical Equipment          DME Arranged: Bedside commode, Walker rolling with seat DME Agency: [employer] Date DME Agency Contacted: 01/07/23 Time DME Agency Contacted: 1500 Representative spoke with at DME Agency: Vaughan Basta HH Arranged: NA HH Agency: NA   Social Determinants of Health (SDOH) Interventions SDOH Screenings   Food Insecurity: No Food Insecurity (12/28/2022)  Recent Concern: Food Insecurity - Food Insecurity Present (11/23/2022)  Housing: Low Risk  (12/28/2022)  Transportation Needs: No Transportation Needs (12/28/2022)  Utilities: Not At Risk (12/28/2022)  Alcohol Screen: Low Risk  (11/23/2022)  Depression (PHQ2-9): Low Risk  (11/26/2022)  Financial Resource Strain: Low Risk  (12/24/2022)   Received from [facility]  Recent Concern: Financial Resource Strain - High Risk (11/23/2022)  Physical Activity: Insufficiently Active (11/23/2022)  Social Connections: Moderately Integrated (11/23/2022)  Stress: No Stress Concern Present (11/23/2022)  Tobacco Use: Low Risk  (12/27/2022)   Readmission Risk  Interventions     No data to display

## 2023-01-11 NOTE — Progress Notes (Signed)
CARDIAC REHAB PHASE I   Pt ready for discharge home. All [facility] educational needs met. Referral sent to [facility] for CRP2.   1030-1100  Woodroe Chen, RN BSN 01/11/2023 11:02 AM

## 2023-01-11 NOTE — Plan of Care (Signed)
  Problem: Nutritional: Goal: Maintenance of adequate nutrition will improve Outcome: Progressing   Problem: Tissue Perfusion: Goal: Adequacy of tissue perfusion will improve Outcome: Progressing   Problem: Activity: Goal: Ability to return to baseline activity level will improve Outcome: Progressing   Problem: Activity: Goal: Risk for activity intolerance will decrease Outcome: Progressing   Problem: Skin Integrity: Goal: Wound healing without signs and symptoms of infection Outcome: Progressing   Problem: Skin Integrity: Goal: Risk for impaired skin integrity will decrease Outcome: Progressing   Problem: Urinary Elimination: Goal: Ability to achieve and maintain adequate renal perfusion and functioning will improve Outcome: Progressing

## 2023-01-11 NOTE — Progress Notes (Addendum)
10 Days Post-Op Procedure(s) (LRB): CORONARY ARTERY BYPASS GRAFTING (CABG) TIMES THREE USING THE LEFT INTERNAL MAMMARY ARTERY (LIMA) AND ENDOSCOPICALLY HARVESTED RIGHT GREATER SAPHENOUS VEIN (N/A) TRANSESOPHAGEAL ECHOCARDIOGRAM (N/A) Subjective: Conts to feel well  Objective: Vital signs in last 24 hours: Temp:  [97 F (36.1 C)-98.4 F (36.9 C)] 97.9 F (36.6 C) (08/02 0311) Pulse Rate:  [70-91] 77 (08/02 0600) Cardiac Rhythm: Normal sinus rhythm (08/02 0400) Resp:  [12-24] 13 (08/02 0600) BP: (90-134)/(44-87) 114/59 (08/02 0600) SpO2:  [86 %-100 %] 95 % (08/02 0600) Weight:  [90 kg] 90 kg (08/02 0500)  Hemodynamic parameters for last 24 hours:    Intake/Output from previous day: 08/01 0701 - 08/02 0700 In: 120 [P.O.:120] Out: 1 [Urine:1] Intake/Output this shift: No intake/output data recorded.  General appearance: alert, cooperative, and no distress Heart: regular rate and rhythm Lungs: clear to auscultation bilaterally Abdomen: benign Extremities: no edema Wound: incis healing well  Lab Results: Recent Labs    01/10/23 0011 01/11/23 0250  WBC 11.2* 8.6  HGB 10.2* 10.1*  HCT 31.5* 30.4*  PLT 349 312   BMET:  Recent Labs    01/10/23 0011 01/11/23 0250  NA 135 135  K 4.1 4.1  CL 105 105  CO2 20* 18*  GLUCOSE 132* 128*  BUN 14 12  CREATININE 0.90 0.86  CALCIUM 8.7* 8.8*    PT/INR: No results for input(s): "LABPROT", "INR" in the last 72 hours. ABG    Component Value Date/Time   PHART 7.366 01/02/2023 0334   HCO3 19.4 (L) 01/02/2023 0334   TCO2 20 (L) 01/02/2023 0334   ACIDBASEDEF 5.0 (H) 01/02/2023 0334   O2SAT 96 01/02/2023 0334   CBG (last 3)  Recent Labs    01/10/23 1600 01/10/23 2109 01/11/23 0635  GLUCAP 139* 138* 146*    Meds Scheduled Meds:  atorvastatin  80 mg Oral Daily   bisacodyl  10 mg Oral Daily   Or   bisacodyl  10 mg Rectal Daily   Chlorhexidine Gluconate Cloth  6 each Topical Daily   clopidogrel  75 mg Oral Daily    docusate sodium  200 mg Oral BID   enoxaparin (LOVENOX) injection  40 mg Subcutaneous QHS   glipiZIDE  10 mg Oral Q breakfast   insulin aspart  0-24 Units Subcutaneous TID WC   insulin aspart  2 Units Subcutaneous TID WC   insulin glargine-yfgn  10 Units Subcutaneous QHS   losartan  25 mg Oral Daily   magnesium oxide  400 mg Oral BID   metFORMIN  500 mg Oral BID WC   pantoprazole  40 mg Oral Daily   polyethylene glycol  17 g Oral BID   topiramate  50 mg Oral BID   Continuous Infusions:  albumin human Stopped (01/02/23 0018)   lactated ringers     lactated ringers Stopped (01/02/23 1055)   PRN Meds:.albumin human, dextrose, metoprolol tartrate, midazolam, ondansetron (ZOFRAN) IV, mouth rinse, oxyCODONE, traMADol  Xrays DG Chest 1 View  Result Date: 01/10/2023 CLINICAL DATA:  Status post CABG EXAM: CHEST  1 VIEW COMPARISON:  01/03/2023 FINDINGS: Interval removal of a right neck vascular catheter. Status post median sternotomy and CABG. Low volume AP portable examination with minimal atelectasis of the mid lungs. IMPRESSION: 1. Interval removal of a right neck vascular catheter. 2. Low volume AP portable examination with minimal atelectasis of the mid lungs. Electronically Signed   By: Jearld Lesch M.D.   On: 01/10/2023 12:20    Assessment/Plan: S/P  Procedure(s) (LRB): CORONARY ARTERY BYPASS GRAFTING (CABG) TIMES THREE USING THE LEFT INTERNAL MAMMARY ARTERY (LIMA) AND ENDOSCOPICALLY HARVESTED RIGHT GREATER SAPHENOUS VEIN (N/A) TRANSESOPHAGEAL ECHOCARDIOGRAM (N/A)  1 still having some some episodes of atrial tachycardias including some afib/flutter- EP thinks she can go home with these episodes and Zio-live patch, currently no neg chronotropes or antidysrhythmics d/t bradycardia previously 2 O2 sats good on RA- some desats- had CPAP trial didn't tolerate d/t claustrophobia- she plans on sleep study in near future 3 weight at preop 4 BS controlled on current meds 5 MG 1.8- cont oral  replacement 6 normal renal fxn 7 WBC normal today 8 H/H stable 9 she now has a neighbor who will stay with her 10 home later today after Zio patch obtained       LOS: 15 days    Rowe Clack PA-C Pager [phone] 01/11/2023  Agree with above Still with brief runs of svt EP signed off on dc and zio patch therapy Did not tolerate cpap secondary to claustrophobia

## 2023-01-12 DIAGNOSIS — Z951 Presence of aortocoronary bypass graft: Secondary | ICD-10-CM | POA: Diagnosis not present

## 2023-01-14 ENCOUNTER — Telehealth: Payer: Self-pay

## 2023-01-14 NOTE — Transitions of Care (Post Inpatient/ED Visit) (Signed)
01/14/2023  Name: [name] MRN: [id number] DOB: 1961/01/12  Today's TOC FU Call Status: Today's TOC FU Call Status:: Successful TOC FU Call Completed TOC FU Call Complete Date: 01/14/23  Transition Care Management Follow-up Telephone Call Date of Discharge: 01/11/23 Discharge Facility: [facility]) Type of Discharge: Inpatient Admission Primary Inpatient Discharge Diagnosis:: Chest Pain How have you been since you were released from the hospital?: Better (Am in a little pain but am feeling better since being home) Any questions or concerns?: Yes Patient Questions/Concerns:: Patient had to miss her sleep study and needs to get it rescheduled Patient Questions/Concerns Addressed: Other: (Message left by this writer at Dr. Teofilo Pod office to reschedule sleep study)  Items Reviewed: Did you receive and understand the discharge instructions provided?: Yes Medications obtained,verified, and reconciled?: Yes (Medications Reviewed) Any new allergies since your discharge?: No Dietary orders reviewed?: No Do you have support at home?: Yes People in Home: friend(s)  Medications Reviewed Today: Medications Reviewed Today     Reviewed by Jodelle Gross, RN (Case Manager) on 01/14/23 at 1017  Med List Status: <None>   Medication Order Taking? Sig Documenting Provider Last Dose Status Informant  albuterol (VENTOLIN HFA) 108 (90 Base) MCG/ACT inhaler [id number] Yes Inhale 2 puffs into the lungs every 4 (four) hours as needed for wheezing or shortness of breath. (NEEDS TO BE SEEN) Dettinger, Elige Radon, MD Taking Active Self, Pharmacy Records  atorvastatin (LIPITOR) 80 MG tablet [id number] Yes Take 1 tablet (80 mg total) by mouth daily. Rowe Clack, PA-C Taking Active   buPROPion (WELLBUTRIN XL) 150 MG 24 hr tablet [id number] Yes TAKE 3 TABLETS BY MOUTH DAILY. Bennie Pierini, FNP Taking Active Self, Pharmacy Records  clopidogrel (PLAVIX) 75 MG tablet [id number] Yes Take 1 tablet  (75 mg total) by mouth daily. Rowe Clack, PA-C Taking Active   EPINEPHrine 0.3 mg/0.3 mL IJ SOAJ injection [id number]  Inject 0.3 mg into the muscle as needed for anaphylaxis. Bennie Pierini, FNP  Active Self, Pharmacy Records  fenofibrate 160 MG tablet [id number] Yes Take 1 tablet (160 mg total) by mouth daily. Daphine Deutscher Mary-Margaret, FNP Taking Active Self, Pharmacy Records  glipiZIDE (GLUCOTROL XL) 10 MG 24 hr tablet [id number] Yes Take 1 tablet (10 mg total) by mouth daily. Bennie Pierini, FNP Taking Active Self, Pharmacy Records  losartan (COZAAR) 25 MG tablet [id number] Yes Take 1 tablet (25 mg total) by mouth daily. Rowe Clack, PA-C Taking Active   magnesium oxide (MAG-OX) 400 (240 Mg) MG tablet [id number] Yes Take 1 tablet (400 mg total) by mouth 2 (two) times daily for 30 days, then as directed by provider. Rowe Clack, PA-C Taking Active   metFORMIN (GLUCOPHAGE) 500 MG tablet [id number] Yes Take 1 tablet (500 mg total) by mouth 2 (two) times daily with a meal. Daphine Deutscher, Mary-Margaret, FNP Taking Active Self, Pharmacy Records  rizatriptan (MAXALT) 10 MG tablet [id number] Yes Take 1 tablet (10 mg total) by mouth as needed for migraine. May repeat in 2 hours if needed Bennie Pierini, FNP Taking Active Self, Pharmacy Records  RYBELSUS 7 MG TABS [id number] Yes TAKE 1 TABLET (7 MG TOTAL) BY MOUTH DAILY Bennie Pierini, FNP Taking Active Self, Pharmacy Records  topiramate (TOPAMAX) 50 MG tablet [id number] Yes Take 1 tablet (50 mg total) by mouth 2 (two) times daily. Bennie Pierini, FNP Taking Active Self, Pharmacy Records  traMADol Janean Sark) 50 MG tablet [id number] Yes Take 1 tablet (50 mg total) by mouth every 6 (six) hours as needed  for moderate pain. Rowe Clack, PA-C Taking Active             Home Care and Equipment/Supplies: Were Home Health Services Ordered?: No Any new equipment or medical supplies ordered?: Yes Name of Medical supply agency?: [employer] Were  you able to get the equipment/medical supplies?: Yes Do you have any questions related to the use of the equipment/supplies?: No  Functional Questionnaire: Do you need assistance with bathing/showering or dressing?: No Do you need assistance with meal preparation?: No Do you need assistance with eating?: No Do you have difficulty maintaining continence: No Do you need assistance with getting out of bed/getting out of a chair/moving?: No Do you have difficulty managing or taking your medications?: No  Follow up appointments reviewed: PCP Follow-up appointment confirmed?: Yes Date of PCP follow-up appointment?: 01/16/23 Follow-up Provider: Bennie Pierini, FNP Specialist Hospital Follow-up appointment confirmed?: Yes Date of Specialist follow-up appointment?: 01/18/23 Follow-Up Specialty Provider:: Dr. Cliffton Asters Do you need transportation to your follow-up appointment?: No Do you understand care options if your condition(s) worsen?: Yes-patient verbalized understanding  SDOH Interventions Today    Flowsheet Row Most Recent Value  SDOH Interventions   Housing Interventions Intervention Not Indicated  Transportation Interventions Intervention Not Indicated  Utilities Interventions Intervention Not Indicated      Jodelle Gross, RN, BSN, CCM Care Management Coordinator [facility] Phone: [phone]/Fax: [phone]

## 2023-01-16 ENCOUNTER — Other Ambulatory Visit: Payer: Self-pay | Admitting: Nurse Practitioner

## 2023-01-16 ENCOUNTER — Telehealth (INDEPENDENT_AMBULATORY_CARE_PROVIDER_SITE_OTHER): Payer: 59 | Admitting: Nurse Practitioner

## 2023-01-16 ENCOUNTER — Encounter: Payer: Self-pay | Admitting: Nurse Practitioner

## 2023-01-16 DIAGNOSIS — Z7689 Persons encountering health services in other specified circumstances: Secondary | ICD-10-CM

## 2023-01-16 DIAGNOSIS — E782 Mixed hyperlipidemia: Secondary | ICD-10-CM

## 2023-01-16 DIAGNOSIS — Z951 Presence of aortocoronary bypass graft: Secondary | ICD-10-CM

## 2023-01-16 DIAGNOSIS — Z09 Encounter for follow-up examination after completed treatment for conditions other than malignant neoplasm: Secondary | ICD-10-CM | POA: Diagnosis not present

## 2023-01-16 MED ORDER — RYBELSUS 7 MG PO TABS: 7.0000 mg | ORAL_TABLET | Freq: Every day | ORAL | 1 refills | Status: DC

## 2023-01-16 NOTE — Telephone Encounter (Signed)
Insurance will not pay for rybellsus. Changed to farxiga 1 tablet daily  Meds ordered this encounter  Medications   dapagliflozin propanediol (FARXIGA) 5 MG TABS tablet    Sig: Take 1 tablet (5 mg total) by mouth daily before breakfast.    Dispense:  30 tablet    Refill:  1   Mary-Margaret Daphine Deutscher, FNP

## 2023-01-16 NOTE — Progress Notes (Signed)
Virtual Visit Consent   [name], you are scheduled for a virtual visit with Mary-Margaret Daphine Deutscher, FNP, a [facility] provider, today.     Just as with appointments in the office, your consent must be obtained to participate.  Your consent will be active for this visit and any virtual visit you may have with one of our providers in the next 365 days.     If you have a MyChart account, a copy of this consent can be sent to you electronically.  All virtual visits are billed to your insurance company just like a traditional visit in the office.    As this is a virtual visit, video technology does not allow for your provider to perform a traditional examination.  This may limit your provider's ability to fully assess your condition.  If your provider identifies any concerns that need to be evaluated in person or the need to arrange testing (such as labs, EKG, etc.), we will make arrangements to do so.     Although advances in technology are sophisticated, we cannot ensure that it will always work on either your end or our end.  If the connection with a video visit is poor, the visit may have to be switched to a telephone visit.  With either a video or telephone visit, we are not always able to ensure that we have a secure connection.     I need to obtain your verbal consent now.   Are you willing to proceed with your visit today? YES   [name] has provided verbal consent on 01/16/2023 for a virtual visit (video or telephone).   Mary-Margaret Daphine Deutscher, FNP   Date: 01/16/2023 8:39 AM   Virtual Visit via Video Note   I, Mary-Margaret Daphine Deutscher, connected with SECRET CRADLE ([id number], [birth year]) on 01/16/23 at  9:20 AM EDT by a video-enabled telemedicine application and verified that I am speaking with the correct person using two identifiers.  Location: Patient: Virtual Visit Location Patient: Home Provider: Virtual Visit Location Provider: Mobile   I discussed the  limitations of evaluation and management by telemedicine and the availability of in person appointments. The patient expressed understanding and agreed to proceed.    History of Present Illness: [name] is a 62 y.o. who identifies as a female who was assigned female at birth, and is being seen today for Transtiton of care.  HPI: Today's visit was for Transitional Care Management.  The patient was discharged from [facility] on 01/11/23 with a primary diagnosis of chest pain and S/P CABGX3.Marland Kitchen   Contact with the patient and/or caregiver, by a clinical staff member, was made on 01/14/23 and was documented as a telephone encounter within the EMR.  Through chart review and discussion with the patient I have determined that management of their condition is of high complexity.    Since being home patient says she is doing much better. Still has some fatigue but has been improving. Having chest wall pain and is on pain meds. She has follow up with cardiology on Friday. Denies any chest pain, sob or dizziness.     Review of Systems  Cardiovascular:  Negative for chest pain and claudication.    Problems:  Patient Active Problem List   Diagnosis Date Noted   S/P CABG x 3 01/01/2023   Non-ST elevation (NSTEMI) myocardial infarction (HCC) 12/31/2022   Coronary artery disease due to lipid rich plaque 12/31/2022   Pure hypercholesterolemia 12/31/2022  Chest pain 12/27/2022   Hypotension 12/27/2022   AKI (acute kidney injury) (HCC) 12/27/2022   Carpal tunnel syndrome 08/16/2021   Numbness and tingling in right hand 07/11/2021   Diverticulosis 07/13/2016   Morbid obesity (HCC) 03/31/2015   Hyperlipidemia associated with type 2 diabetes mellitus (HCC)    Hypothyroidism    Hypertension    POLYP, COLON 04/13/2010   Type 2 diabetes mellitus (HCC) 04/13/2010   Anxiety state 04/13/2010   Depression 04/13/2010   Asthma 04/13/2010   Migraines 04/13/2010    Allergies:  Allergies   Allergen Reactions   Aspirin Anaphylaxis   Bee Venom Anaphylaxis   Benadryl [Diphenhydramine Hcl] Anaphylaxis   Morphine And Codeine Shortness Of Breath and Itching   Vicks Formula 44 Cough-Cold Pm [Dm-Apap-Cpm] Shortness Of Breath and Rash    Not anaphylaxis   Medications:  Current Outpatient Medications:    albuterol (VENTOLIN HFA) 108 (90 Base) MCG/ACT inhaler, Inhale 2 puffs into the lungs every 4 (four) hours as needed for wheezing or shortness of breath. (NEEDS TO BE SEEN), Disp: 18 g, Rfl: 1   atorvastatin (LIPITOR) 80 MG tablet, Take 1 tablet (80 mg total) by mouth daily., Disp: 30 tablet, Rfl: 2   buPROPion (WELLBUTRIN XL) 150 MG 24 hr tablet, TAKE 3 TABLETS BY MOUTH DAILY., Disp: 270 tablet, Rfl: 0   clopidogrel (PLAVIX) 75 MG tablet, Take 1 tablet (75 mg total) by mouth daily., Disp: 30 tablet, Rfl: 2   EPINEPHrine 0.3 mg/0.3 mL IJ SOAJ injection, Inject 0.3 mg into the muscle as needed for anaphylaxis., Disp: 2 each, Rfl: 2   fenofibrate 160 MG tablet, Take 1 tablet (160 mg total) by mouth daily., Disp: 90 tablet, Rfl: 1   glipiZIDE (GLUCOTROL XL) 10 MG 24 hr tablet, Take 1 tablet (10 mg total) by mouth daily., Disp: 180 tablet, Rfl: 1   losartan (COZAAR) 25 MG tablet, Take 1 tablet (25 mg total) by mouth daily., Disp: 30 tablet, Rfl: 1   magnesium oxide (MAG-OX) 400 (240 Mg) MG tablet, Take 1 tablet (400 mg total) by mouth 2 (two) times daily for 30 days, then as directed by provider., Disp: 120 tablet, Rfl: 0   metFORMIN (GLUCOPHAGE) 500 MG tablet, Take 1 tablet (500 mg total) by mouth 2 (two) times daily with a meal., Disp: 180 tablet, Rfl: 1   rizatriptan (MAXALT) 10 MG tablet, Take 1 tablet (10 mg total) by mouth as needed for migraine. May repeat in 2 hours if needed, Disp: 10 tablet, Rfl: 2   RYBELSUS 7 MG TABS, TAKE 1 TABLET (7 MG TOTAL) BY MOUTH DAILY, Disp: 90 tablet, Rfl: 1   topiramate (TOPAMAX) 50 MG tablet, Take 1 tablet (50 mg total) by mouth 2 (two) times daily.,  Disp: 180 tablet, Rfl: 1   traMADol (ULTRAM) 50 MG tablet, Take 1 tablet (50 mg total) by mouth every 6 (six) hours as needed for moderate pain., Disp: 28 tablet, Rfl: 0  Observations/Objective: Patient is well-developed, well-nourished in no acute distress.  Resting comfortably  at home.  Head is normocephalic, atraumatic.  No labored breathing.  Speech is clear and coherent with logical content.  Patient is alert and oriented at baseline.    Assessment and Plan:  [name] in today with chief complaint of No chief complaint on file.   1. Encounter for support and coordination of transition of care 2. S/P CABG x 3 Eat 3 meals a daily Walking 3x a day for short distances in house  Keep follow up with cardiology Hospital records reviewed RTO prn    Follow Up Instructions: I discussed the assessment and treatment plan with the patient. The patient was provided an opportunity to ask questions and all were answered. The patient agreed with the plan and demonstrated an understanding of the instructions.  A copy of instructions were sent to the patient via MyChart.  The patient was advised to call back or seek an in-person evaluation if the symptoms worsen or if the condition fails to improve as anticipated.  Time:  I spent 10 minutes with the patient via telehealth technology discussing the above problems/concerns.    Mary-Margaret Daphine Deutscher, FNP

## 2023-01-16 NOTE — Patient Instructions (Signed)
Cardiac Rehabilitation What is cardiac rehabilitation? Cardiac rehabilitation is a treatment program that helps improve the health and well-being of people who have heart problems. Cardiac rehabilitation includes exercise training, education, and counseling to help you get stronger and return to an active lifestyle. This program can help you get better faster, lessen any future hospital stays, and improve your quality of life. Why might I need cardiac rehabilitation? Cardiac rehabilitation programs can help when you have or have had: A heart attack. Heart failure. Peripheral artery disease. Coronary artery disease. Angina. Lung or breathing problems. Cardiac rehabilitation programs are also used when you have had: Coronary artery bypass graft surgery. Heart valve replacement. Heart stent placement. Heart transplant. Aneurysm repair. What are the benefits of cardiac rehabilitation? Cardiac rehabilitation can help you: Lessen problems like chest pain and trouble breathing. Change risk factors that contribute to heart disease, such as: Smoking. High blood pressure. High cholesterol. Diabetes. Not being physically active. Weighing over 30% more than your ideal weight. Diet. Improve your emotional outlook so you feel: More hopeful. Better about yourself. More confident about taking care of yourself. Less depressed. Cardiac rehabilitation can also help you: Get support from health experts as well as other people with similar challenges. Learn healthy ways to manage stress. Learn how to manage and understand your medicines. Teach your family about your condition and how to participate in your recovery. What is a cardiac rehabilitation team and what do they do? The cardiac rehabilitation team works with you to make a plan based on your health and goals. Your program will be specific to you and your needs and may change as you progress. You may work with a health care team that  includes: Doctors. Nurses. Dietitians. Psychologists. Exercise specialists. Physical and occupational therapists. A cardiac rehabilitation team will check your health history and do a physical exam. You may need blood tests, exercise stress tests, and other tests to make sure that you are ready to start cardiac rehabilitation. What are the phases of cardiac rehabilitation? A cardiac rehabilitation program is often split into phases. You move from one phase to the next. Phase 1 This phase starts while you are still in the hospital. You may: Start by walking in your room and then in the hall. Do simple exercises with a therapist.  Phase 2 This phase begins when you go home or to another center. You will travel to a cardiac rehabilitation center or another place where rehabilitation is offered. This phase may last 12-18 weeks and is usually 2-3 days a week. During this phase: You will slowly increase your activity level while being closely watched by a nurse or therapist. You will have medical tests and exams to monitor your progress. Your exercises may include strength or resistance training along with activities that cause your heart to beat faster (aerobic exercises), such as walking on a treadmill. Your condition will determine how often and how long these sessions last. You may learn how to: [facility] heart-healthy meals. Control your blood sugar, if this applies. Stop smoking. Manage your medicines. This may include scheduling or planning how and when to take your medicines. If you have questions about your medicines, talk with your health care provider.  Phase 3 This phase continues for the rest of your life. In this phase: There will be less supervision. You may continue to do cardiac rehabilitation activities or become part of a group in your community. You may benefit from talking about your experience with other people who have  similar challenges. Follow these instructions at  home: Take over-the-counter and prescription medicines only as told by your health care provider. Keep all follow-up visits. This helps your health care team monitor your progress. Get help right away if: You have severe chest discomfort, especially if the pain is crushing or pressure-like and spreads to your arms, back, neck, or jaw. You have discomfort in the chest, neck, arm, jaw, or back (angina) that lasts for longer than 5 minutes and medicine does not help. You have fast or irregular heartbeats (palpitations). You have trouble breathing or shortness of breath. You have sweating that is not caused by exercise. You have nausea or vomiting. You feel tired or weak and do not know why. You have any symptoms of a stroke. "BE FAST" is an easy way to remember the main warning signs of a stroke: B - Balance. Signs are dizziness, sudden trouble walking, or loss of balance. E - Eyes. Signs are trouble seeing or a sudden change in vision. F - Face. Signs are sudden weakness or numbness of the face, or the face or eyelid drooping on one side. A - Arms. Signs are weakness or numbness in an arm. This happens suddenly and usually on one side of the body. S - Speech. Signs are sudden trouble speaking, slurred speech, or trouble understanding what people say. T - Time. Time to call emergency services. Write down what time symptoms started. You have other signs of a stroke, such as: A sudden, severe headache with no known cause. Seizure. These symptoms may be an emergency. Get help right away. Call 911. Do not wait to see if the symptoms will go away. Do not drive yourself to the hospital. This information is not intended to replace advice given to you by your health care provider. Make sure you discuss any questions you have with your health care provider. Document Revised: 10/30/2021 Document Reviewed: 10/30/2021 Elsevier Patient Education  2024 [employer].

## 2023-01-16 NOTE — Telephone Encounter (Signed)
JARDIANCE 10 MG TABS tablet        Changed from: dapagliflozin propanediol (FARXIGA) 5 MG TABS tablet    Pharmacy comment: Alternative Requested:NON-FORMULARY DRUG, INSURANCE ONLY COVERS JARDIANCE.

## 2023-01-16 NOTE — Telephone Encounter (Signed)
  Name from pharmacy: RYBELSUS 7 MG TABLET    Pharmacy comment: Alternative Requested:NOT ON FORMULARY.

## 2023-01-17 ENCOUNTER — Telehealth: Payer: Self-pay

## 2023-01-17 DIAGNOSIS — Z48812 Encounter for surgical aftercare following surgery on the circulatory system: Secondary | ICD-10-CM | POA: Diagnosis not present

## 2023-01-17 DIAGNOSIS — I2510 Atherosclerotic heart disease of native coronary artery without angina pectoris: Secondary | ICD-10-CM | POA: Diagnosis not present

## 2023-01-17 DIAGNOSIS — F17200 Nicotine dependence, unspecified, uncomplicated: Secondary | ICD-10-CM | POA: Diagnosis not present

## 2023-01-17 DIAGNOSIS — Z7902 Long term (current) use of antithrombotics/antiplatelets: Secondary | ICD-10-CM | POA: Diagnosis not present

## 2023-01-17 DIAGNOSIS — E668 Other obesity: Secondary | ICD-10-CM | POA: Diagnosis not present

## 2023-01-17 DIAGNOSIS — I214 Non-ST elevation (NSTEMI) myocardial infarction: Secondary | ICD-10-CM | POA: Diagnosis not present

## 2023-01-17 DIAGNOSIS — I10 Essential (primary) hypertension: Secondary | ICD-10-CM | POA: Diagnosis not present

## 2023-01-17 DIAGNOSIS — Z7984 Long term (current) use of oral hypoglycemic drugs: Secondary | ICD-10-CM | POA: Diagnosis not present

## 2023-01-17 DIAGNOSIS — E119 Type 2 diabetes mellitus without complications: Secondary | ICD-10-CM | POA: Diagnosis not present

## 2023-01-17 DIAGNOSIS — Z951 Presence of aortocoronary bypass graft: Secondary | ICD-10-CM | POA: Diagnosis not present

## 2023-01-17 NOTE — Telephone Encounter (Signed)
Spoke with Luisa Hart again about [facility]. Patient contacted PT. PT had wrong apt number. Start of care completed.

## 2023-01-17 NOTE — Progress Notes (Signed)
     [address]       [name] [address]             [phone]       Patient: Home Provider: Office Consent for Telemedicine visit obtained.  Today's visit was completed via a real-time telehealth (see specific modality noted below). The patient/authorized person provided oral consent at the time of the visit to engage in a telemedicine encounter with the present provider at [facility]. The patient/authorized person was informed of the potential benefits, limitations, and risks of telemedicine. The patient/authorized person expressed understanding that the laws that protect confidentiality also apply to telemedicine. The patient/authorized person acknowledged understanding that telemedicine does not provide emergency services and that he or she would need to call 911 or proceed to the nearest hospital for help if such a need arose.   Total time spent in the clinical discussion 10 minutes.  Telehealth Modality: Phone visit (audio only)  I had a telephone visit with  [name] who is s/p CABG.  Overall doing well.  Pain is minimal.  Ambulating well. Vitals have been stable.  [name] will see [place] back in 1 month with a chest x-ray for cardiac rehab clearance.  Ethan Clayburn Keane Scrape

## 2023-01-17 NOTE — Telephone Encounter (Signed)
Luisa Hart with [facility] contacted the office to state he was at home today to start care. Left vm the day before to advise he would be coming. States once at home, he was greeted by a female that states, "she doesn't live here anymore". He did not give new address as he states he doesn't know where she went. He left anther Vm message with patient. Contacted patient as well and left vm message for return call.

## 2023-01-18 ENCOUNTER — Ambulatory Visit (INDEPENDENT_AMBULATORY_CARE_PROVIDER_SITE_OTHER): Payer: Self-pay | Admitting: Thoracic Surgery (Cardiothoracic Vascular Surgery)

## 2023-01-18 DIAGNOSIS — I10 Essential (primary) hypertension: Secondary | ICD-10-CM | POA: Diagnosis not present

## 2023-01-18 DIAGNOSIS — Z951 Presence of aortocoronary bypass graft: Secondary | ICD-10-CM | POA: Diagnosis not present

## 2023-01-18 DIAGNOSIS — I214 Non-ST elevation (NSTEMI) myocardial infarction: Secondary | ICD-10-CM | POA: Diagnosis not present

## 2023-01-18 DIAGNOSIS — Z48812 Encounter for surgical aftercare following surgery on the circulatory system: Secondary | ICD-10-CM | POA: Diagnosis not present

## 2023-01-18 DIAGNOSIS — Z7902 Long term (current) use of antithrombotics/antiplatelets: Secondary | ICD-10-CM | POA: Diagnosis not present

## 2023-01-18 DIAGNOSIS — E668 Other obesity: Secondary | ICD-10-CM | POA: Diagnosis not present

## 2023-01-18 DIAGNOSIS — F17200 Nicotine dependence, unspecified, uncomplicated: Secondary | ICD-10-CM | POA: Diagnosis not present

## 2023-01-18 DIAGNOSIS — E119 Type 2 diabetes mellitus without complications: Secondary | ICD-10-CM | POA: Diagnosis not present

## 2023-01-18 DIAGNOSIS — I2510 Atherosclerotic heart disease of native coronary artery without angina pectoris: Secondary | ICD-10-CM | POA: Diagnosis not present

## 2023-01-18 DIAGNOSIS — Z7984 Long term (current) use of oral hypoglycemic drugs: Secondary | ICD-10-CM | POA: Diagnosis not present

## 2023-01-21 ENCOUNTER — Telehealth: Payer: Self-pay

## 2023-01-21 DIAGNOSIS — I2510 Atherosclerotic heart disease of native coronary artery without angina pectoris: Secondary | ICD-10-CM | POA: Diagnosis not present

## 2023-01-21 DIAGNOSIS — Z7902 Long term (current) use of antithrombotics/antiplatelets: Secondary | ICD-10-CM | POA: Diagnosis not present

## 2023-01-21 DIAGNOSIS — E119 Type 2 diabetes mellitus without complications: Secondary | ICD-10-CM | POA: Diagnosis not present

## 2023-01-21 DIAGNOSIS — I10 Essential (primary) hypertension: Secondary | ICD-10-CM | POA: Diagnosis not present

## 2023-01-21 DIAGNOSIS — Z951 Presence of aortocoronary bypass graft: Secondary | ICD-10-CM | POA: Diagnosis not present

## 2023-01-21 DIAGNOSIS — Z48812 Encounter for surgical aftercare following surgery on the circulatory system: Secondary | ICD-10-CM | POA: Diagnosis not present

## 2023-01-21 DIAGNOSIS — Z7984 Long term (current) use of oral hypoglycemic drugs: Secondary | ICD-10-CM | POA: Diagnosis not present

## 2023-01-21 DIAGNOSIS — E668 Other obesity: Secondary | ICD-10-CM | POA: Diagnosis not present

## 2023-01-21 DIAGNOSIS — R110 Nausea: Secondary | ICD-10-CM

## 2023-01-21 DIAGNOSIS — I214 Non-ST elevation (NSTEMI) myocardial infarction: Secondary | ICD-10-CM | POA: Diagnosis not present

## 2023-01-21 DIAGNOSIS — F17200 Nicotine dependence, unspecified, uncomplicated: Secondary | ICD-10-CM | POA: Diagnosis not present

## 2023-01-21 MED ORDER — ONDANSETRON HCL 4 MG PO TABS: 4.0000 mg | ORAL_TABLET | Freq: Three times a day (TID) | ORAL | 0 refills | Status: AC | PRN

## 2023-01-21 NOTE — Telephone Encounter (Signed)
-----   Message from Lowella Dandy sent at 01/21/2023  4:01 PM EDT ----- Regarding: RE: Nausea Darl Pikes,  She can have some zofran... would also tell her to take pain medication with food on stomach.  She has a follow up with Cardiology last this week.  Erin ----- Message ----- From: Joycelyn Schmid, LPN Sent: 02/23/7828   3:19 PM EDT To: Harriet Pho, PA-C; Corliss Skains, MD Subject: Nausea                                         C/o Nausea x4 days Forgot to mention to [facility] on telephone appt. She is having BM's daily, no fevers, and taking all her medications with food. She is requesting a RX for nausea if you agree,  please advise SW

## 2023-01-21 NOTE — Progress Notes (Signed)
Coding Query  Expected acute blood loss anemia, clinically significant requiring transfusion  Rowe Clack, PA-C

## 2023-01-21 NOTE — Progress Notes (Deleted)
Cardiology Office Note:  .    Date:  01/21/2023  ID:  [name], DOB 01-28-1961, MRN [id number] PCP: Bennie Pierini, FNP  [facility] Providers Cardiologist:  None { Click to update primary MD,subspecialty MD or APP then REFRESH:1}    CC: follow up CABG  History of Present Illness: [name] is a 62 y.o. female HTN, HLD, and T2DM who presented 12/28/2022 with chest pain.Found to have multi-vessel CAD.  Found to have AT and SVT without symptoms.    Relevant histories: .  Social- has a dog, moved in 2024, former [facility] pt. ROS: As per HPI.   Studies Reviewed: .   Cardiac Studies & Procedures   CARDIAC CATHETERIZATION  CARDIAC CATHETERIZATION 12/28/2022  Narrative 1.  Severe multivessel disease. 2.  LVEDP of 13 mmHg  Recommendation: Will start heparin infusion 2 hours after TR band is removed; obtain cardiothoracic surgical evaluation.  Findings Coronary Findings Diagnostic  Dominance: Right  Left Anterior Descending Mid LAD lesion is 99% stenosed.  First Diagonal Branch 1st Diag lesion is 70% stenosed.  Ramus Intermedius Vessel is small. There is moderate diffuse disease throughout the vessel.  Left Circumflex Prox Cx lesion is 75% stenosed.  Right Coronary Artery Mid RCA lesion is 100% stenosed.  Right Posterior Descending Artery Collaterals RPDA filled by collaterals from RV Branch.  Third Right Posterolateral Branch Collaterals 3rd RPL filled by collaterals from 3rd Mrg.  Collaterals 3rd RPL filled by collaterals from 3rd Mrg.  Intervention  No interventions have been documented.     ECHOCARDIOGRAM  ECHOCARDIOGRAM COMPLETE 12/29/2022  Narrative ECHOCARDIOGRAM REPORT    Patient Name:   [name] Date of Exam: 12/29/2022 Medical Rec #:  [id number]            Height:       64.0 in Accession #:    [id number]           Weight:       202.3 lb Date of Birth:  1961-02-26            BSA:           1.966 m Patient Age:    62 years             BP:           117/66 mmHg Patient Gender: F                    HR:           70 bpm. Exam Location:  Inpatient  Procedure: 2D Echo, Cardiac Doppler and Color Doppler  Indications:    Chest Pain R07.9  History:        Patient has prior history of Echocardiogram examinations. CAD; Risk Factors:Hypertension, Diabetes, Dyslipidemia and Non-Smoker.  Sonographer:    Dondra Prader RVT RCS Referring Phys: [id number] [facility] Theo Reither  IMPRESSIONS   1. Left ventricular ejection fraction, by estimation, is 60 to 65%. The left ventricle has normal function. The left ventricle has no regional wall motion abnormalities. Left ventricular diastolic parameters were normal. 2. Right ventricular systolic function is normal. The right ventricular size is normal. 3. Left atrial size was mildly dilated. 4. Trivial mitral valve regurgitation. 5. The aortic valve is tricuspid. Aortic valve regurgitation is not visualized. Aortic valve sclerosis is present, with no evidence of aortic valve stenosis. 6. The inferior vena cava is normal in size with greater than 50% respiratory variability, suggesting  right atrial pressure of 3 mmHg.  FINDINGS Left Ventricle: Left ventricular ejection fraction, by estimation, is 60 to 65%. The left ventricle has normal function. The left ventricle has no regional wall motion abnormalities. The left ventricular internal cavity size was normal in size. There is no left ventricular hypertrophy. Left ventricular diastolic parameters were normal.  Right Ventricle: The right ventricular size is normal. Right vetricular wall thickness was not assessed. Right ventricular systolic function is normal.  Left Atrium: Left atrial size was mildly dilated.  Right Atrium: Right atrial size was normal in size.  Pericardium: There is no evidence of pericardial effusion.  Mitral Valve: Mild mitral annular calcification. Trivial mitral valve  regurgitation.  Tricuspid Valve: The tricuspid valve is normal in structure. Tricuspid valve regurgitation is trivial.  Aortic Valve: The aortic valve is tricuspid. Aortic valve regurgitation is not visualized. Aortic valve sclerosis is present, with no evidence of aortic valve stenosis. Aortic valve mean gradient measures 10.5 mmHg. Aortic valve peak gradient measures 19.9 mmHg. Aortic valve area, by VTI measures 2.10 cm.  Pulmonic Valve: The pulmonic valve was not well visualized. Pulmonic valve regurgitation is not visualized.  Aorta: The aortic root and ascending aorta are structurally normal, with no evidence of dilitation.  Venous: The inferior vena cava is normal in size with greater than 50% respiratory variability, suggesting right atrial pressure of 3 mmHg.  IAS/Shunts: No atrial level shunt detected by color flow Doppler.   LEFT VENTRICLE PLAX 2D LVIDd:         4.70 cm   Diastology LVIDs:         3.00 cm   LV e' medial:    7.72 cm/s LV PW:         1.00 cm   LV E/e' medial:  14.4 LV IVS:        0.90 cm   LV e' lateral:   10.80 cm/s LVOT diam:     2.00 cm   LV E/e' lateral: 10.3 LV SV:         94 LV SV Index:   48 LVOT Area:     3.14 cm   RIGHT VENTRICLE             IVC RV Basal diam:  2.65 cm     IVC diam: 1.90 cm RV Mid diam:    2.40 cm RV S prime:     15.90 cm/s TAPSE (M-mode): 2.2 cm  LEFT ATRIUM             Index        RIGHT ATRIUM           Index LA diam:        3.70 cm 1.88 cm/m   RA Area:     10.20 cm LA Vol (A2C):   62.2 ml 31.64 ml/m  RA Volume:   21.50 ml  10.94 ml/m LA Vol (A4C):   67.6 ml 34.39 ml/m LA Biplane Vol: 65.8 ml 33.47 ml/m AORTIC VALVE                     PULMONIC VALVE AV Area (Vmax):    2.06 cm      PV Vmax:       1.21 m/s AV Area (Vmean):   2.09 cm      PV Peak grad:  5.9 mmHg AV Area (VTI):     2.10 cm AV Vmax:           223.00  cm/s AV Vmean:          151.500 cm/s AV VTI:            0.448 m AV Peak Grad:      19.9 mmHg AV  Mean Grad:      10.5 mmHg LVOT Vmax:         146.00 cm/s LVOT Vmean:        101.000 cm/s LVOT VTI:          0.300 m LVOT/AV VTI ratio: 0.67  AORTA Ao Root diam: 2.70 cm Ao Asc diam:  3.40 cm  MITRAL VALVE MV Area (PHT): 3.08 cm     SHUNTS MV Decel Time: 246 msec     Systemic VTI:  0.30 m MV E velocity: 111.00 cm/s  Systemic Diam: 2.00 cm MV A velocity: 112.00 cm/s MV E/A ratio:  0.99  Dietrich Pates MD Electronically signed by Dietrich Pates MD Signature Date/Time: 12/29/2022/11:50:02 AM    Final   TEE  ECHO INTRAOPERATIVE TEE 01/01/2023  Narrative *INTRAOPERATIVE TRANSESOPHAGEAL REPORT *    Patient Name:   [name] Date of Exam: 01/01/2023 Medical Rec #:  [id number]            Height:       64.0 in Accession #:    [id number]           Weight:       200.1 lb Date of Birth:  12-08-1960            BSA:          1.96 m Patient Age:    62 years             BP:           158/74 mmHg Patient Gender: F                    HR:           68 bpm. Exam Location:  Anesthesiology  Transesophogeal exam was perform intraoperatively during surgical procedure. Patient was closely monitored under general anesthesia during the entirety of examination.  Indications:     CAD Native Vessel i25.10 Sonographer:     Irving Burton Senior RDCS Performing Phys: [id number] Eliezer Lofts LIGHTFOOT Diagnosing Phys: Earl Lites Stoltzfus  Complications: No known complications during this procedure. POST-OP IMPRESSIONS _ Left Ventricle: The left ventricle is unchanged from pre-bypass. _ Right Ventricle: The right ventricle appears unchanged from pre-bypass. _ Aorta: The aorta appears unchanged from pre-bypass. _ Left Atrial Appendage: The left atrial appendage appears unchanged from pre-bypass. _ Aortic Valve: The aortic valve appears unchanged from pre-bypass. _ Mitral Valve: The mitral valve appears unchanged from pre-bypass. _ Tricuspid Valve: The tricuspid valve appears unchanged from pre-bypass. _  Pulmonic Valve: The pulmonic valve appears unchanged from pre-bypass. _ Interatrial Septum: The interatrial septum appears unchanged from pre-bypass. _ Pericardium: The pericardium appears unchanged from pre-bypass.  PRE-OP FINDINGS Left Ventricle: The left ventricle has normal systolic function, with an ejection fraction of 55-60%. The cavity size was normal. There is no left ventricular hypertrophy. Left ventricular diastolic parameters are consistent with Grade I diastolic dysfunction (impaired relaxation).   Right Ventricle: The right ventricle has normal systolic function. The cavity was normal. There is no increase in right ventricular wall thickness.  Left Atrium: Left atrial size was normal in size. No left atrial/left atrial appendage thrombus was detected. Left atrial appendage velocity is normal at greater than 40 cm/s.  Right  Atrium: Right atrial size was normal in size.  Interatrial Septum: No atrial level shunt detected by color flow Doppler. The interatrial septum appears to be lipomatous. There is no evidence of a patent foramen ovale.  Pericardium: There is no evidence of pericardial effusion. There is no pleural effusion.  Mitral Valve: The mitral valve is normal in structure. Mitral valve regurgitation is not visualized by color flow Doppler. There is no evidence of mitral valve vegetation. There is No evidence of mitral stenosis.  Tricuspid Valve: The tricuspid valve was normal in structure. Tricuspid valve regurgitation was not visualized by color flow Doppler. No evidence of tricuspid stenosis is present. There is no evidence of tricuspid valve vegetation.  Aortic Valve: The aortic valve is tricuspid Aortic valve regurgitation is moderate by color flow Doppler. The jet is centrally-directed. There is no stenosis of the aortic valve, with a calculated valve area of 1.66 cm. There is no evidence of aortic valve vegetation. There is mild thickening and mild calcification  present on the aortic valve right coronary, left coronary and non-coronary cusps. PHT .  Pulmonic Valve: The pulmonic valve was normal in structure, with normal. No evidence of pumonic stenosis. Pulmonic valve regurgitation is not visualized by color flow Doppler.   Aorta: There is evidence of plaque in the descending aorta and ascending aorta; Grade III, measuring 3-2mm in size.  Pulmonary Artery: The pulmonary artery is of normal size.  Shunts: There is no evidence of an atrial septal defect.  +--------------+--------++ LEFT VENTRICLE          +----------------+---------++ +--------------+--------++  Diastology                PLAX 2D                 +----------------+---------++ +--------------+--------++  LV e' lateral:  8.23 cm/s LVIDd:        3.38 cm   +----------------+---------++ +--------------+--------++  LV E/e' lateral:7.0       LVIDs:        2.36 cm   +----------------+---------++ +--------------+--------++  LV e' medial:   4.63 cm/s LV PW:        0.90 cm   +----------------+---------++ +--------------+--------++  LV E/e' medial: 12.4      LV IVS:       0.90 cm   +----------------+---------++ +--------------+--------++ LVOT diam:    2.10 cm  +--------------+--------++ LV SV:        27 ml    +--------------+--------++ LV SV Index:  13.30    +--------------+--------++ LVOT Area:    3.46 cm +--------------+--------++                        +--------------+--------++  +------------------+------------++ AORTIC VALVE                   +------------------+------------++ AV Area (Vmax):   1.61 cm     +------------------+------------++ AV Area (Vmean):  1.45 cm     +------------------+------------++ AV Area (VTI):    1.66 cm     +------------------+------------++ AV Vmax:          217.00 cm/s  +------------------+------------++ AV Vmean:         167.000  cm/s +------------------+------------++ AV VTI:           0.501 m      +------------------+------------++ AV Peak Grad:     18.8 mmHg    +------------------+------------++ AV Mean Grad:     12.0 mmHg    +------------------+------------++ LVOT  Vmax:        101.00 cm/s  +------------------+------------++ LVOT Vmean:       70.100 cm/s  +------------------+------------++ LVOT VTI:         0.240 m      +------------------+------------++ LVOT/AV VTI ratio:0.48         +------------------+------------++  +-------------+-------++ AORTA                +-------------+-------++ Ao Root diam:3.00 cm +-------------+-------++ Ao STJ diam: 2.4 cm  +-------------+-------++ Ao Asc diam: 3.20 cm +-------------+-------++  +--------------+----------++ MITRAL VALVE              +--------------+-------+ +--------------+----------++  SHUNTS                MV Area (PHT):3.11 cm    +--------------+-------+ +--------------+----------++  Systemic VTI: 0.24 m  MV Peak grad: 3.2 mmHg    +--------------+-------+ +--------------+----------++  Systemic Diam:2.10 cm MV Mean grad: 1.0 mmHg    +--------------+-------+ +--------------+----------++ MV Vmax:      0.90 m/s   +--------------+----------++ MV Vmean:     43.2 cm/s  +--------------+----------++ MV PHT:       70.76 msec +--------------+----------++ MV Decel Time:244 msec   +--------------+----------++ +--------------+----------++ MV E velocity:57.60 cm/s +--------------+----------++ MV A velocity:81.00 cm/s +--------------+----------++ MV E/A ratio: 0.71       +--------------+----------++   Hester Mates Electronically signed by Hester Mates Signature Date/Time: 01/03/2023/9:51:29 PM    Final             Physical Exam:    VS:  There were no vitals taken for this visit.   Wt Readings from Last 3 Encounters:  01/11/23 198  lb 6.6 oz (90 kg)  11/26/22 205 lb (93 kg)  08/21/22 201 lb (91.2 kg)    Gen: *** distress, *** obese/well nourished/malnourished   Neck: No JVD, *** carotid bruit Ears: *** Frank Sign Cardiac: No Rubs or Gallops, *** Murmur, ***cardia, *** radial pulses Respiratory: Clear to auscultation bilaterally, *** effort, ***  respiratory rate GI: Soft, nontender, non-distended *** MS: No *** edema; *** moves all extremities Integument: Skin feels *** Neuro:  At time of evaluation, alert and oriented to person/place/time/situation *** Psych: Normal affect, patient feels ***   ASSESSMENT AND PLAN: .    Coronary Artery Disease; Obstructive/Nonobstructive - symptomatic on *** with stable/unstable angina on therapy *** - anatomy: *** - continue ASA 81 mg; Continue *** until *** - continue statin, goal LDL < 55 - sending Lp(a) *** - continue BB - continue nitrates; *** PDEi - continue ACEi - discussed cardiac rehab  HTN HLD with DM    SVT with Paroxysmal AV block   Riley Lam, MD FASE [facility] Cardiologist [facility]  [address] [place], [place] [address] [phone]  9:55 AM

## 2023-01-22 DIAGNOSIS — Z48812 Encounter for surgical aftercare following surgery on the circulatory system: Secondary | ICD-10-CM | POA: Diagnosis not present

## 2023-01-22 DIAGNOSIS — Z951 Presence of aortocoronary bypass graft: Secondary | ICD-10-CM | POA: Diagnosis not present

## 2023-01-22 DIAGNOSIS — E668 Other obesity: Secondary | ICD-10-CM | POA: Diagnosis not present

## 2023-01-22 DIAGNOSIS — I10 Essential (primary) hypertension: Secondary | ICD-10-CM | POA: Diagnosis not present

## 2023-01-22 DIAGNOSIS — I2510 Atherosclerotic heart disease of native coronary artery without angina pectoris: Secondary | ICD-10-CM | POA: Diagnosis not present

## 2023-01-22 DIAGNOSIS — F17200 Nicotine dependence, unspecified, uncomplicated: Secondary | ICD-10-CM | POA: Diagnosis not present

## 2023-01-22 DIAGNOSIS — Z7984 Long term (current) use of oral hypoglycemic drugs: Secondary | ICD-10-CM | POA: Diagnosis not present

## 2023-01-22 DIAGNOSIS — Z7902 Long term (current) use of antithrombotics/antiplatelets: Secondary | ICD-10-CM | POA: Diagnosis not present

## 2023-01-22 DIAGNOSIS — I214 Non-ST elevation (NSTEMI) myocardial infarction: Secondary | ICD-10-CM | POA: Diagnosis not present

## 2023-01-22 DIAGNOSIS — E119 Type 2 diabetes mellitus without complications: Secondary | ICD-10-CM | POA: Diagnosis not present

## 2023-01-23 ENCOUNTER — Other Ambulatory Visit: Payer: Self-pay

## 2023-01-23 DIAGNOSIS — Z951 Presence of aortocoronary bypass graft: Secondary | ICD-10-CM | POA: Diagnosis not present

## 2023-01-23 DIAGNOSIS — I2510 Atherosclerotic heart disease of native coronary artery without angina pectoris: Secondary | ICD-10-CM | POA: Diagnosis not present

## 2023-01-23 DIAGNOSIS — I214 Non-ST elevation (NSTEMI) myocardial infarction: Secondary | ICD-10-CM | POA: Diagnosis not present

## 2023-01-23 DIAGNOSIS — Z1231 Encounter for screening mammogram for malignant neoplasm of breast: Secondary | ICD-10-CM

## 2023-01-23 DIAGNOSIS — Z7902 Long term (current) use of antithrombotics/antiplatelets: Secondary | ICD-10-CM | POA: Diagnosis not present

## 2023-01-23 DIAGNOSIS — F17200 Nicotine dependence, unspecified, uncomplicated: Secondary | ICD-10-CM | POA: Diagnosis not present

## 2023-01-23 DIAGNOSIS — E119 Type 2 diabetes mellitus without complications: Secondary | ICD-10-CM | POA: Diagnosis not present

## 2023-01-23 DIAGNOSIS — Z7984 Long term (current) use of oral hypoglycemic drugs: Secondary | ICD-10-CM | POA: Diagnosis not present

## 2023-01-23 DIAGNOSIS — Z48812 Encounter for surgical aftercare following surgery on the circulatory system: Secondary | ICD-10-CM | POA: Diagnosis not present

## 2023-01-23 DIAGNOSIS — E668 Other obesity: Secondary | ICD-10-CM | POA: Diagnosis not present

## 2023-01-23 DIAGNOSIS — I10 Essential (primary) hypertension: Secondary | ICD-10-CM | POA: Diagnosis not present

## 2023-01-24 ENCOUNTER — Ambulatory Visit: Payer: 59 | Admitting: Internal Medicine

## 2023-01-24 DIAGNOSIS — E668 Other obesity: Secondary | ICD-10-CM | POA: Diagnosis not present

## 2023-01-24 DIAGNOSIS — Z7984 Long term (current) use of oral hypoglycemic drugs: Secondary | ICD-10-CM | POA: Diagnosis not present

## 2023-01-24 DIAGNOSIS — Z48812 Encounter for surgical aftercare following surgery on the circulatory system: Secondary | ICD-10-CM | POA: Diagnosis not present

## 2023-01-24 DIAGNOSIS — Z7902 Long term (current) use of antithrombotics/antiplatelets: Secondary | ICD-10-CM | POA: Diagnosis not present

## 2023-01-24 DIAGNOSIS — I214 Non-ST elevation (NSTEMI) myocardial infarction: Secondary | ICD-10-CM | POA: Diagnosis not present

## 2023-01-24 DIAGNOSIS — Z951 Presence of aortocoronary bypass graft: Secondary | ICD-10-CM | POA: Diagnosis not present

## 2023-01-24 DIAGNOSIS — F17200 Nicotine dependence, unspecified, uncomplicated: Secondary | ICD-10-CM | POA: Diagnosis not present

## 2023-01-24 DIAGNOSIS — E119 Type 2 diabetes mellitus without complications: Secondary | ICD-10-CM | POA: Diagnosis not present

## 2023-01-24 DIAGNOSIS — I2510 Atherosclerotic heart disease of native coronary artery without angina pectoris: Secondary | ICD-10-CM | POA: Diagnosis not present

## 2023-01-24 DIAGNOSIS — I10 Essential (primary) hypertension: Secondary | ICD-10-CM | POA: Diagnosis not present

## 2023-01-25 DIAGNOSIS — F17200 Nicotine dependence, unspecified, uncomplicated: Secondary | ICD-10-CM | POA: Diagnosis not present

## 2023-01-25 DIAGNOSIS — E668 Other obesity: Secondary | ICD-10-CM | POA: Diagnosis not present

## 2023-01-25 DIAGNOSIS — I214 Non-ST elevation (NSTEMI) myocardial infarction: Secondary | ICD-10-CM | POA: Diagnosis not present

## 2023-01-25 DIAGNOSIS — I2510 Atherosclerotic heart disease of native coronary artery without angina pectoris: Secondary | ICD-10-CM | POA: Diagnosis not present

## 2023-01-25 DIAGNOSIS — Z951 Presence of aortocoronary bypass graft: Secondary | ICD-10-CM | POA: Diagnosis not present

## 2023-01-25 DIAGNOSIS — Z7902 Long term (current) use of antithrombotics/antiplatelets: Secondary | ICD-10-CM | POA: Diagnosis not present

## 2023-01-25 DIAGNOSIS — E119 Type 2 diabetes mellitus without complications: Secondary | ICD-10-CM | POA: Diagnosis not present

## 2023-01-25 DIAGNOSIS — Z7984 Long term (current) use of oral hypoglycemic drugs: Secondary | ICD-10-CM | POA: Diagnosis not present

## 2023-01-25 DIAGNOSIS — Z48812 Encounter for surgical aftercare following surgery on the circulatory system: Secondary | ICD-10-CM | POA: Diagnosis not present

## 2023-01-25 DIAGNOSIS — I10 Essential (primary) hypertension: Secondary | ICD-10-CM | POA: Diagnosis not present

## 2023-01-28 DIAGNOSIS — I10 Essential (primary) hypertension: Secondary | ICD-10-CM | POA: Diagnosis not present

## 2023-01-28 DIAGNOSIS — Z7902 Long term (current) use of antithrombotics/antiplatelets: Secondary | ICD-10-CM | POA: Diagnosis not present

## 2023-01-28 DIAGNOSIS — I2510 Atherosclerotic heart disease of native coronary artery without angina pectoris: Secondary | ICD-10-CM | POA: Diagnosis not present

## 2023-01-28 DIAGNOSIS — I214 Non-ST elevation (NSTEMI) myocardial infarction: Secondary | ICD-10-CM | POA: Diagnosis not present

## 2023-01-28 DIAGNOSIS — Z951 Presence of aortocoronary bypass graft: Secondary | ICD-10-CM | POA: Diagnosis not present

## 2023-01-28 DIAGNOSIS — F17200 Nicotine dependence, unspecified, uncomplicated: Secondary | ICD-10-CM | POA: Diagnosis not present

## 2023-01-28 DIAGNOSIS — E119 Type 2 diabetes mellitus without complications: Secondary | ICD-10-CM | POA: Diagnosis not present

## 2023-01-28 DIAGNOSIS — Z7984 Long term (current) use of oral hypoglycemic drugs: Secondary | ICD-10-CM | POA: Diagnosis not present

## 2023-01-28 DIAGNOSIS — E668 Other obesity: Secondary | ICD-10-CM | POA: Diagnosis not present

## 2023-01-28 DIAGNOSIS — Z48812 Encounter for surgical aftercare following surgery on the circulatory system: Secondary | ICD-10-CM | POA: Diagnosis not present

## 2023-01-31 NOTE — Addendum Note (Signed)
Encounter addended by: Flavia Shipper on: 01/31/2023 8:41 AM  Actions taken: Imaging Exam ended

## 2023-02-01 DIAGNOSIS — Z48812 Encounter for surgical aftercare following surgery on the circulatory system: Secondary | ICD-10-CM | POA: Diagnosis not present

## 2023-02-01 DIAGNOSIS — E668 Other obesity: Secondary | ICD-10-CM | POA: Diagnosis not present

## 2023-02-01 DIAGNOSIS — E119 Type 2 diabetes mellitus without complications: Secondary | ICD-10-CM | POA: Diagnosis not present

## 2023-02-01 DIAGNOSIS — F17200 Nicotine dependence, unspecified, uncomplicated: Secondary | ICD-10-CM | POA: Diagnosis not present

## 2023-02-01 DIAGNOSIS — Z7984 Long term (current) use of oral hypoglycemic drugs: Secondary | ICD-10-CM | POA: Diagnosis not present

## 2023-02-01 DIAGNOSIS — I214 Non-ST elevation (NSTEMI) myocardial infarction: Secondary | ICD-10-CM | POA: Diagnosis not present

## 2023-02-01 DIAGNOSIS — I10 Essential (primary) hypertension: Secondary | ICD-10-CM | POA: Diagnosis not present

## 2023-02-01 DIAGNOSIS — Z951 Presence of aortocoronary bypass graft: Secondary | ICD-10-CM | POA: Diagnosis not present

## 2023-02-01 DIAGNOSIS — I2510 Atherosclerotic heart disease of native coronary artery without angina pectoris: Secondary | ICD-10-CM | POA: Diagnosis not present

## 2023-02-01 DIAGNOSIS — Z7902 Long term (current) use of antithrombotics/antiplatelets: Secondary | ICD-10-CM | POA: Diagnosis not present

## 2023-02-11 ENCOUNTER — Other Ambulatory Visit: Payer: Self-pay | Admitting: Nurse Practitioner

## 2023-02-11 DIAGNOSIS — H1032 Unspecified acute conjunctivitis, left eye: Secondary | ICD-10-CM

## 2023-02-12 ENCOUNTER — Other Ambulatory Visit (HOSPITAL_COMMUNITY): Payer: Self-pay

## 2023-02-12 NOTE — Progress Notes (Deleted)
  Electrophysiology Office Note:   Date:  02/12/2023  ID:  [name], DOB 27-Mar-1961, MRN [id number]  Primary Cardiologist: None Electrophysiologist: Lanier Prude, MD  {Click to update primary MD,subspecialty MD or APP then REFRESH:1}    History of Present Illness:   [name] is a 62 y.o. female with h/o SVT, CAD s/p CABG, and advanced AV block in the post CABG period seen today for post hospital follow up.    Admitted 7/18 - *** with near syncope and unstable angina. Found to have severe multivessel CAD and underwent CABG 01/01/2023 with a LIMA to LAD, saphenous vein graft to PDA, and saphenous vein graft obtuse marginal.   Post operatively pt had brief episodes of SVT vs AFL, non-sustained treated with propranolol THEN lopressor + amiodarone.  On 7/30 she had an episode of bradycardia while bending over with RR prolongation as well as CHB, felt to be vagal. AV nodal agents and amiodarone stopped with recommendations to tolerate short bursts of SVT while healing.  Discharged with stable conduction and a live monitor.  Since discharge from hospital the patient reports doing ***.  she denies chest pain, palpitations, dyspnea, PND, orthopnea, nausea, vomiting, dizziness, syncope, edema, weight gain, or early satiety.   Review of systems complete and found to be negative unless listed in HPI.   EP Information / Studies Reviewed:    EKG is ordered today. Personal review as below.       Cardiac Monitor 02/02/2023 HR 60 - 174, average 86 bpm. 2 NSVT, longest 4 beats. 8 SVT, longest 12.7 seconds with an average rate of 138 bpm. Rare supraventricular and ventricular ectopy. No atrial fibrillation. No sustained arrhythmias.  Echo 12/29/2022 LVEF 55-60%  Physical Exam:   VS:  There were no vitals taken for this visit.   Wt Readings from Last 3 Encounters:  01/11/23 198 lb 6.6 oz (90 kg)  11/26/22 205 lb (93 kg)  08/21/22 201 lb (91.2 kg)     GEN: Well nourished,  well developed in no acute distress NECK: No JVD; No carotid bruits CARDIAC: {EPRHYTHM:28826}, no murmurs, rubs, gallops RESPIRATORY:  Clear to auscultation without rales, wheezing or rhonchi  ABDOMEN: Soft, non-tender, non-distended EXTREMITIES:  No edema; No deformity   ASSESSMENT AND PLAN:    Intermittent Advanced AV block s/p CABG Suspect vagal component confounded by post operative period.  Leave off AV nodal agents.  No indication for pacing unless she has further bradycardia.  Monitor as above with rare SVT/NSVT but no bradycardia.   CAD s/p CABG Denies s/s ischemia  Paroxysmal SVT Would plan on avoiding AV nodal agents if possible.   {Click here to Review PMH, Prob List, Meds, Allergies, SHx, FHx  :1}   Follow up with {WUJWJ:19147} {EPFOLLOW WG:95621}  Signed, Graciella Freer, PA-C

## 2023-02-13 ENCOUNTER — Ambulatory Visit: Payer: 59 | Admitting: Student

## 2023-02-13 NOTE — Progress Notes (Signed)
[address]       [name] [address]             [phone]       HPI: Patient returns for routine postoperative follow-up having undergone CABG x 3 on 01/01/2023.  The patient's early postoperative recovery while in the hospital was notable for arrhythmic issues.  She initially had sinus pauses but later developed Atrial Fibrillation with RVR.  She would continue to have pauses when she was in NSR.  EP consult recommended avoidance of nodal blocking agents.  She was discharged home with a ZIO patch in place.  The patient has canceled EP and Cardiology follow up appointments since hospital discharge.  Since hospital discharge the patient reports she is doing okay.  She states she is doing everything herself as she has no family here.  She is walking a mile 2x per day.  She does not check her blood sugars and states she does not have a meter or supplies.  She states she has been trying really hard to stick to a heart healthy diet as she does not wish to have this surgery again.  Her weight has been stable without swelling.  She is interested in cardiac rehab, but they have not yet contacted her.  Current Outpatient Medications  Medication Sig Dispense Refill   albuterol (VENTOLIN HFA) 108 (90 Base) MCG/ACT inhaler Inhale 2 puffs into the lungs every 4 (four) hours as needed for wheezing or shortness of breath. (NEEDS TO BE SEEN) 18 g 1   atorvastatin (LIPITOR) 80 MG tablet Take 1 tablet (80 mg total) by mouth daily. 30 tablet 2   buPROPion (WELLBUTRIN XL) 150 MG 24 hr tablet TAKE 3 TABLETS BY MOUTH DAILY. 270 tablet 0   clopidogrel (PLAVIX) 75 MG tablet Take 1 tablet (75 mg total) by mouth daily. 30 tablet 2   empagliflozin (JARDIANCE) 10 MG TABS tablet Take 3 tablets (30 mg total) by mouth daily. 90 tablet 1   EPINEPHrine 0.3 mg/0.3 mL IJ SOAJ injection Inject 0.3 mg into the muscle as needed for anaphylaxis. 2 each 2   fenofibrate 160 MG tablet Take 1 tablet (160 mg  total) by mouth daily. 90 tablet 1   glipiZIDE (GLUCOTROL XL) 10 MG 24 hr tablet Take 1 tablet (10 mg total) by mouth daily. 180 tablet 1   losartan (COZAAR) 25 MG tablet Take 1 tablet (25 mg total) by mouth daily. 30 tablet 1   magnesium oxide (MAG-OX) 400 (240 Mg) MG tablet Take 1 tablet (400 mg total) by mouth 2 (two) times daily for 30 days, then as directed by provider. 120 tablet 0   metFORMIN (GLUCOPHAGE) 500 MG tablet Take 1 tablet (500 mg total) by mouth 2 (two) times daily with a meal. 180 tablet 1   ondansetron (ZOFRAN) 4 MG tablet Take 1 tablet (4 mg total) by mouth every 8 (eight) hours as needed for nausea. Deliver to pt's home 15 tablet 0   rizatriptan (MAXALT) 10 MG tablet Take 1 tablet (10 mg total) by mouth as needed for migraine. May repeat in 2 hours if needed 10 tablet 2   topiramate (TOPAMAX) 50 MG tablet Take 1 tablet (50 mg total) by mouth 2 (two) times daily. 180 tablet 1   traMADol (ULTRAM) 50 MG tablet Take 1 tablet (50 mg total) by mouth every 6 (six) hours as needed for moderate pain. 28 tablet 0   No current facility-administered medications for  this visit.    Physical Exam: BP (!) 159/84   Pulse 100   Resp 20   Ht 5\' 2"  (1.575 m)   Wt 198 lb (89.8 kg)   SpO2 97% Comment: RA  BMI 36.21 kg/m   Gen: NAD, very anxious Heart: RRR Lungs: CTA bilaterally Ext; no edema Incision: well healed  Diagnostic Tests:  CXR: w/o pleural effusions, sternal wires appear intact   A/P:  S/P CABG x 3 Sinus Pauses, Arrhythmia- patient was discharged with ZIO in place.. she has canceled all cardiology follow up since hospital discharge.. she states no one asked her about rescheduling and she has not yet gotten results from her monitor they gave her... I told her these appointments need rescheduled and we will help her with this HTN- likely related to stress and anxiety of difficulty finding imaging location and our office.. continue current regimen DM- patient not  checking sugars, she is taking medications.. I have sent order for glucometer and supplies to [facility] in [place], her insurance should cover this. Cardiac Rehabilitation- encouraged to enroll, ordered placed with route to Dr. Cliffton Asters for signature Activity- increase as tolerated, sternal precautions discussed Morbid obesity   RTC prn  Lowella Dandy, PA-C [facility] [phone]

## 2023-02-13 NOTE — Patient Instructions (Signed)
Patient is counseled regarding the importance of long term risk factor modification as they pertain to the presence of ischemic heart disease including avoiding the use of all tobacco products, dietary modifications and medical therapy for diabetes, cholesterol and lipid management, and regular exercise.     You may return to driving an automobile as long as you are no longer requiring oral narcotic pain relievers during the daytime.  It would be wise to start driving only short distances during the daylight and gradually increase from there as you feel comfortable.   You are encouraged to enroll and participate in the outpatient cardiac rehab program beginning as soon as practical.   Make every effort to maintain a "heart-healthy" lifestyle with regular physical exercise and adherence to a low-fat, low-carbohydrate diet.  Continue to seek regular follow-up appointments with your primary care physician and/or cardiologist.

## 2023-02-18 ENCOUNTER — Other Ambulatory Visit: Payer: Self-pay | Admitting: Thoracic Surgery (Cardiothoracic Vascular Surgery)

## 2023-02-18 DIAGNOSIS — Z951 Presence of aortocoronary bypass graft: Secondary | ICD-10-CM

## 2023-02-19 ENCOUNTER — Ambulatory Visit
Admission: RE | Admit: 2023-02-19 | Discharge: 2023-02-19 | Disposition: A | Discharge status: Home / Self Care | Payer: 59 | Source: Ambulatory Visit | Attending: Thoracic Surgery (Cardiothoracic Vascular Surgery) | Admitting: Thoracic Surgery (Cardiothoracic Vascular Surgery)

## 2023-02-19 ENCOUNTER — Ambulatory Visit (INDEPENDENT_AMBULATORY_CARE_PROVIDER_SITE_OTHER): Payer: Self-pay | Admitting: Physician Assistant

## 2023-02-19 ENCOUNTER — Telehealth (HOSPITAL_COMMUNITY): Payer: Self-pay | Admitting: *Deleted

## 2023-02-19 VITALS — BP 159/84 | HR 100 | Resp 20 | Ht 62.0 in | Wt 198.0 lb

## 2023-02-19 DIAGNOSIS — Z951 Presence of aortocoronary bypass graft: Secondary | ICD-10-CM

## 2023-02-19 MED ORDER — BLOOD GLUCOSE MONITORING SUPPL DEVI: 1.0000 | Freq: Three times a day (TID) | 0 refills | Status: DC

## 2023-02-19 MED ORDER — LANCETS MISC. MISC: 1.0000 | Freq: Three times a day (TID) | 0 refills | Status: DC

## 2023-02-19 MED ORDER — LANCET DEVICE MISC: 1.0000 | Freq: Three times a day (TID) | 0 refills | Status: DC

## 2023-02-19 MED ORDER — BLOOD GLUCOSE TEST VI STRP: 1.0000 | ORAL_STRIP | Freq: Three times a day (TID) | 0 refills | Status: DC

## 2023-02-19 NOTE — Telephone Encounter (Signed)
Called and encouraged pt to please contact Dr. Izora Ribas office  and  EP follow up appt that were cancelled but not rescheduled.  Unable to move forward with scheduling CR until the appts have been completed and cleared for group exercise.  Pt verbalized understanding. Alanson Aly, BSN Cardiac and Emergency planning/management officer

## 2023-02-27 ENCOUNTER — Ambulatory Visit: Payer: 59 | Attending: Student | Admitting: Pulmonary Disease

## 2023-02-27 ENCOUNTER — Encounter: Payer: Self-pay | Admitting: Student

## 2023-02-27 VITALS — BP 130/84 | HR 93 | Ht 64.0 in | Wt 198.0 lb

## 2023-02-27 DIAGNOSIS — Z951 Presence of aortocoronary bypass graft: Secondary | ICD-10-CM

## 2023-02-27 DIAGNOSIS — I9789 Other postprocedural complications and disorders of the circulatory system, not elsewhere classified: Secondary | ICD-10-CM

## 2023-02-27 DIAGNOSIS — I4430 Unspecified atrioventricular block: Secondary | ICD-10-CM

## 2023-02-27 DIAGNOSIS — I471 Supraventricular tachycardia, unspecified: Secondary | ICD-10-CM | POA: Diagnosis not present

## 2023-02-27 NOTE — Patient Instructions (Signed)
Medication Instructions:  Your physician recommends that you continue on your current medications as directed. Please refer to the Current Medication list given to you today.  *If you need a refill on your cardiac medications before your next appointment, please call your pharmacy*  Lab Work: None ordered If you have labs (blood work) drawn today and your tests are completely normal, you will receive your results only by: MyChart Message (if you have MyChart) OR A paper copy in the mail If you have any lab test that is abnormal or we need to change your treatment, we will call you to review the results.  Follow-Up: At [facility], you and your health needs are our priority.  As part of our continuing mission to provide you with exceptional heart care, we have created designated Provider Care Teams.  These Care Teams include your primary Cardiologist (physician) and Advanced Practice Providers (APPs -  Physician Assistants and Nurse Practitioners) who all work together to provide you with the care you need, when you need it.  Your next appointment:   6 month(s)  Provider:   Steffanie Dunn, MD

## 2023-02-27 NOTE — Progress Notes (Signed)
Electrophysiology Office Note:   Date:  02/27/2023  ID:  [name], DOB [birth year], MRN [id number]  Primary Cardiologist: None Electrophysiologist: Lanier Prude, MD      History of Present Illness:   [name] is a 62 y.o. female with h/o HTN, HLD, CAD s/p CABG x3, DM II  seen today for post hospital follow up.    Admitted 12/27/22-01/11/23 with chest pain, NSTEMI. LHC showed three vessel CAD. ECHO showed preserved biventricular function. She underwent 3 vessel CABG 01/01/23 with a LIMA to LAD, saphenous vein graft to PDA, and saphenous vein graft obtuse marginal.  and post operative course complicated by a 6 second sinus pause overnight following surgery. She was started on IV dobutamine, beta blockers were held. She had recovery of sinus rhythm. She subsequently had bursts of SVT / possible atrial flutter and PO amiodarone was initiated by she then had HR's in the 20's, possible Mobitz II heart block.  She was transferred back to the ICU for monitoring and seen to have bursts of AFL / SVT which was recommended to continue to monitor and avoid AV nodal agents. There was a question of OSA while in ICU. She was discharged to rehab.    Since discharge from hospital the patient reports doing well overall. No known fast or slow HR's. She continues to go for walks and feels she is recovering nicely.  Has not been released to go to cardiac rehab yet.    She denies chest pain, palpitations, dyspnea, PND, orthopnea, nausea, vomiting, dizziness, syncope, edema, weight gain, or early satiety.   Review of systems complete and found to be negative unless listed in HPI.   EP Information / Studies Reviewed:    EKG is ordered today. Personal review as below.  EKG Interpretation Date/Time:  Wednesday February 27 2023 12:11:28 EDT Ventricular Rate:  93 PR Interval:  152 QRS Duration:  78 QT Interval:  354 QTC Calculation: 440 R Axis:   36  Text Interpretation: Normal sinus  rhythm Nonspecific T wave abnormality Confirmed by Canary Brim ([id number]) on 02/27/2023 12:21:30 PM   Studies Intra-Op TEE 12/2022 > LVEF 55-60%, G1DD  LTM 01/11/23 > 2 NSVT, longest 4 beats, 8 SVT, longest 12.7 seconds with ave rate of 138 bpm, rare supraventricular & ventricular ectopy, no AF, no sustained arrhythmias   Physical Exam:   VS:  BP 130/84   Pulse 93   Ht 5\' 4"  (1.626 m)   Wt 198 lb (89.8 kg)   SpO2 97%   BMI 33.99 kg/m    Wt Readings from Last 3 Encounters:  02/27/23 198 lb (89.8 kg)  02/19/23 198 lb (89.8 kg)  01/11/23 198 lb 6.6 oz (90 kg)     GEN: Well nourished, well developed in no acute distress NECK: No JVD; No carotid bruits CARDIAC: Regular rate and rhythm, SEM LSB 2nd ICS, rubs, gallops RESPIRATORY:  Clear to auscultation without rales, wheezing or rhonchi  ABDOMEN: Soft, non-tender, non-distended EXTREMITIES:  No edema; No deformity   ASSESSMENT AND PLAN:    Paroxysmal AV Block  SVT Thought to be vagally mediated while inpatient post-op CABG x3.  -avoid AV nodal blocking agents for now, if needed could potentially re-challenge in future -avoid amiodarone  -no subjective further bursts of fast rates or slow  -LTM with no significant sustained arrhythmias  -EKG in clinic with NSR in 90's   HTN  -continue cozaar  CAD s/p CABG  -follow up with [facility] -ok from EP  standpoint to move forward with Cardiac Rehab  -continue lipitor   Follow up with Dr. Lalla Brothers in 6 months then as needed pending no further SVT/AVB episodes  Signed, Canary Brim, MSN, APRN, NP-C, AGACNP-BC  [facility] - Electrophysiology  02/27/2023, 12:36 PM

## 2023-03-01 ENCOUNTER — Telehealth (HOSPITAL_COMMUNITY): Payer: Self-pay

## 2023-03-01 NOTE — Telephone Encounter (Signed)
Called patient to see if she was interested in participating in the Cardiac Rehab Program. Patient stated yes. Patient will come in for orientation on 03/05/23 @ 10:30AM and will attend the 12:30 PM exercise class.

## 2023-03-01 NOTE — Telephone Encounter (Signed)
Pt insurance is active and benefits verified through [employer]. Co-pay $0.00, DED $7,500.00/$7,500.00 met, out of pocket $9,400.00/$9,400.00 met, co-insurance 50%. No pre-authorization required. [employer], 03/01/23 @ 10:41AM, REF#[id number]   How many CR sessions are covered? (36 visits for TCR, 72 visits for ICR)72 Is this a lifetime maximum or an annual maximum? Lifetime Has the member used any of these services to date? No Is there a time limit (weeks/months) on start of program and/or program completion? No     Will contact patient to see if she is interested in the Cardiac Rehab Program.

## 2023-03-04 ENCOUNTER — Ambulatory Visit: Payer: 59 | Admitting: Nurse Practitioner

## 2023-03-05 ENCOUNTER — Telehealth (HOSPITAL_COMMUNITY): Payer: Self-pay

## 2023-03-05 ENCOUNTER — Ambulatory Visit (HOSPITAL_COMMUNITY): Payer: 59

## 2023-03-05 NOTE — Telephone Encounter (Signed)
Pt called to reschedule for CR.Patient will come in for orientation on 03/07/23 @ 10:30AM and will attend the 12:30PM exercise class.

## 2023-03-06 ENCOUNTER — Telehealth (HOSPITAL_COMMUNITY): Payer: Self-pay

## 2023-03-06 NOTE — Telephone Encounter (Signed)
  Called pt to confirm appt for 03/07/23  at 1030. Gave pt instructions for appt, what to wear, office address, eating/taking meds before, and if sick to call and reschedule. Pt voiced understanding, all questions answered.   Health history completed? Yes   Jonna Coup, MS, ACSM-CEP 03/06/2023 4:49 PM

## 2023-03-07 ENCOUNTER — Encounter (HOSPITAL_COMMUNITY)
Admission: RE | Admit: 2023-03-07 | Discharge: 2023-03-07 | Disposition: A | Discharge status: Home / Self Care | Payer: 59 | Source: Ambulatory Visit | Attending: Cardiology | Admitting: Cardiology

## 2023-03-07 VITALS — BP 120/74 | HR 73 | Ht 64.0 in | Wt 202.6 lb

## 2023-03-07 DIAGNOSIS — Z48812 Encounter for surgical aftercare following surgery on the circulatory system: Secondary | ICD-10-CM | POA: Insufficient documentation

## 2023-03-07 DIAGNOSIS — Z951 Presence of aortocoronary bypass graft: Secondary | ICD-10-CM | POA: Diagnosis present

## 2023-03-07 LAB — GLUCOSE, CAPILLARY: Glucose-Capillary: 245 mg/dL — ABNORMAL HIGH (ref 70–99)

## 2023-03-07 NOTE — Progress Notes (Signed)
Cardiac Individual Treatment Plan  Patient Details  Name: [name] MRN: [id number] Date of Birth: 1960-09-18 Referring Provider:   Flowsheet Row INTENSIVE CARDIAC REHAB ORIENT from 03/07/2023 in [facility]  Referring Provider Thomasene Ripple, DO       Initial Encounter Date:  Flowsheet Row INTENSIVE CARDIAC REHAB ORIENT from 03/07/2023 in [facility]  Date 03/07/23       Visit Diagnosis: 01/01/23 CABG x 3  Patient's Home Medications on Admission:  Current Outpatient Medications:    albuterol (VENTOLIN HFA) 108 (90 Base) MCG/ACT inhaler, Inhale 2 puffs into the lungs every 4 (four) hours as needed for wheezing or shortness of breath. (NEEDS TO BE SEEN), Disp: 18 g, Rfl: 1   atorvastatin (LIPITOR) 80 MG tablet, Take 1 tablet (80 mg total) by mouth daily., Disp: 30 tablet, Rfl: 2   buPROPion (WELLBUTRIN XL) 150 MG 24 hr tablet, TAKE 3 TABLETS BY MOUTH DAILY., Disp: 270 tablet, Rfl: 0   clopidogrel (PLAVIX) 75 MG tablet, Take 1 tablet (75 mg total) by mouth daily., Disp: 30 tablet, Rfl: 2   EPINEPHrine 0.3 mg/0.3 mL IJ SOAJ injection, Inject 0.3 mg into the muscle as needed for anaphylaxis., Disp: 2 each, Rfl: 2   fenofibrate 160 MG tablet, Take 1 tablet (160 mg total) by mouth daily., Disp: 90 tablet, Rfl: 1   glipiZIDE (GLUCOTROL XL) 10 MG 24 hr tablet, Take 1 tablet (10 mg total) by mouth daily., Disp: 180 tablet, Rfl: 1   losartan (COZAAR) 25 MG tablet, Take 1 tablet (25 mg total) by mouth daily., Disp: 30 tablet, Rfl: 1   Lutein 20 MG TABS, Take 20 mg by mouth daily., Disp: , Rfl:    magnesium oxide (MAG-OX) 400 (240 Mg) MG tablet, Take 1 tablet (400 mg total) by mouth 2 (two) times daily for 30 days, then as directed by provider., Disp: 120 tablet, Rfl: 0   metFORMIN (GLUCOPHAGE) 500 MG tablet, Take 1 tablet (500 mg total) by mouth 2 (two) times daily with a  meal., Disp: 180 tablet, Rfl: 1   ondansetron (ZOFRAN) 4 MG tablet, Take 1 tablet (4 mg total) by mouth every 8 (eight) hours as needed for nausea. Deliver to pt's home, Disp: 15 tablet, Rfl: 0   rizatriptan (MAXALT) 10 MG tablet, Take 1 tablet (10 mg total) by mouth as needed for migraine. May repeat in 2 hours if needed, Disp: 10 tablet, Rfl: 2   topiramate (TOPAMAX) 50 MG tablet, Take 1 tablet (50 mg total) by mouth 2 (two) times daily., Disp: 180 tablet, Rfl: 1   traMADol (ULTRAM) 50 MG tablet, Take 1 tablet (50 mg total) by mouth every 6 (six) hours as needed for moderate pain., Disp: 28 tablet, Rfl: 0  Past Medical History: Past Medical History:  Diagnosis Date   Abdominal discomfort    Chronic abdominal and pelvic pain resulting in significant loss of time from work   Anxiety    with depression   Asthma    Chest pain    Depression    Diabetes mellitus    Drug overdose 06/12/2007   60 Naprosyn, psychiatric admission   Hyperlipidemia    Severe   Hypertension    Migraines    Tremor of both hands    on propranolol    Tobacco Use: Social History   Tobacco Use  Smoking Status Never  Smokeless Tobacco Never  Labs: Review Flowsheet  More data exists      Latest Ref Rng & Units 12/27/2022 12/28/2022 12/31/2022 01/01/2023 01/02/2023  Labs for ITP Cardiac and Pulmonary Rehab  Cholestrol 0 - 200 mg/dL - 409  - - -  LDL (calc) 0 - 99 mg/dL - UNABLE TO CALCULATE IF TRIGLYCERIDE OVER 400 mg/dL  - - -  Direct LDL 0 - 99 mg/dL - 811  - - -  HDL-C >91 mg/dL - 38  - - -  Trlycerides <150 mg/dL - 478  - - -  Hemoglobin A1c 4.8 - 5.6 % - - 8.6  - -  PH, Arterial 7.35 - 7.45 - - 7.38  7.323  7.279  7.430  7.356  7.307  7.323  7.366  7.297   PCO2 arterial 32 - 48 mmHg - - 32  35.8  38.8  30.0  40.5  38.0  33.8  34.0  34.7   Bicarbonate 20.0 - 28.0 mmol/L - - 18.9  18.8  18.5  19.9  22.7  19.0  20.4  17.5  19.4  16.9   TCO2 22 - 32 mmol/L 16  - - 20  20  23  21  24  24  20  22  19  19   20  20  20  18    Acid-base deficit 0.0 - 2.0 mmol/L - - 5.2  7.0  8.0  4.0  3.0  7.0  6.0  8.0  5.0  9.0   O2 Saturation % - - 98.7  99  98  100  100  100  69  100  96  98     Details       Multiple values from one day are sorted in reverse-chronological order         Capillary Blood Glucose: Lab Results  Component Value Date   GLUCAP 245 (H) 03/07/2023   GLUCAP 186 (H) 01/11/2023   GLUCAP 146 (H) 01/11/2023   GLUCAP 138 (H) 01/10/2023   GLUCAP 139 (H) 01/10/2023     Exercise Target Goals: Exercise Program Goal: Individual exercise prescription set using results from initial 6 min walk test and THRR while considering  patient's activity barriers and safety.   Exercise Prescription Goal: Initial exercise prescription builds to 30-45 minutes a day of aerobic activity, 2-3 days per week.  Home exercise guidelines will be given to patient during program as part of exercise prescription that the participant will acknowledge.  Activity Barriers & Risk Stratification:  Activity Barriers & Cardiac Risk Stratification - 03/07/23 1204       Activity Barriers & Cardiac Risk Stratification   Activity Barriers Incisional Pain;History of Falls    Cardiac Risk Stratification High   <5METs on            6 Minute Walk:  6 Minute Walk     Row Name 03/07/23 1203         6 Minute Walk   Phase Initial     Distance 1440 feet     Walk Time 6 minutes     # of Rest Breaks 0     MPH 2.73     METS 3.63     RPE 11     Perceived Dyspnea  0     VO2 Peak 12.69     Symptoms No     Resting HR 73 bpm     Resting BP 120/74     Resting Oxygen Saturation  98 %  Exercise Oxygen Saturation  during 6 min walk 98 %     Max Ex. HR 126 bpm     Max Ex. BP 156/78     2 Minute Post BP 124/80              Oxygen Initial Assessment:   Oxygen Re-Evaluation:   Oxygen Discharge (Final Oxygen Re-Evaluation):   Initial Exercise Prescription:  Initial Exercise Prescription -  03/07/23 1200       Date of Initial Exercise RX and Referring Provider   Date 03/07/23    Referring Provider Thomasene Ripple, DO    Expected Discharge Date 05/29/23      Recumbant Bike   Level 1    RPM 50    Watts 50    Minutes 15    METs 2.5      NuStep   Level 1    SPM 65    Minutes 15    METs 2      Prescription Details   Frequency (times per week) 3    Duration Progress to 30 minutes of continuous aerobic without signs/symptoms of physical distress      Intensity   THRR 40-80% of Max Heartrate 63-126    Ratings of Perceived Exertion 11-13    Perceived Dyspnea 0-4      Progression   Progression Continue progressive overload as per policy without signs/symptoms or physical distress.      Resistance Training   Training Prescription Yes    Weight 3    Reps 10-15             Perform Capillary Blood Glucose checks as needed.  Exercise Prescription Changes:   Exercise Comments:   Exercise Goals and Review:   Exercise Goals     Row Name 03/07/23 1021             Exercise Goals   Increase Physical Activity Yes       Intervention Provide advice, education, support and counseling about physical activity/exercise needs.;Develop an individualized exercise prescription for aerobic and resistive training based on initial evaluation findings, risk stratification, comorbidities and participant's personal goals.       Expected Outcomes Short Term: Attend rehab on a regular basis to increase amount of physical activity.;Long Term: Exercising regularly at least 3-5 days a week.;Long Term: Add in home exercise to make exercise part of routine and to increase amount of physical activity.       Increase Strength and Stamina Yes       Intervention Provide advice, education, support and counseling about physical activity/exercise needs.;Develop an individualized exercise prescription for aerobic and resistive training based on initial evaluation findings, risk  stratification, comorbidities and participant's personal goals.       Expected Outcomes Short Term: Increase workloads from initial exercise prescription for resistance, speed, and METs.;Short Term: Perform resistance training exercises routinely during rehab and add in resistance training at home;Long Term: Improve cardiorespiratory fitness, muscular endurance and strength as measured by increased METs and functional capacity ( )       Able to understand and use rate of perceived exertion (RPE) scale Yes       Intervention Provide education and explanation on how to use RPE scale       Expected Outcomes Short Term: Able to use RPE daily in rehab to express subjective intensity level;Long Term:  Able to use RPE to guide intensity level when exercising independently       Knowledge and understanding of  Target Heart Rate Range (THRR) Yes       Intervention Provide education and explanation of THRR including how the numbers were predicted and where they are located for reference       Expected Outcomes Short Term: Able to state/look up THRR;Short Term: Able to use daily as guideline for intensity in rehab;Long Term: Able to use THRR to govern intensity when exercising independently       Understanding of Exercise Prescription Yes       Intervention Provide education, explanation, and written materials on patient's individual exercise prescription       Expected Outcomes Short Term: Able to explain program exercise prescription;Long Term: Able to explain home exercise prescription to exercise independently                Exercise Goals Re-Evaluation :   Discharge Exercise Prescription (Final Exercise Prescription Changes):   Nutrition:  Target Goals: Understanding of nutrition guidelines, daily intake of sodium 1500mg , cholesterol 200mg , calories 30% from fat and 7% or less from saturated fats, daily to have 5 or more servings of fruits and vegetables.  Biometrics:  Pre Biometrics -  03/07/23 1054       Pre Biometrics   Waist Circumference 45.5 inches    Hip Circumference 44 inches    Waist to Hip Ratio 1.03 %    Triceps Skinfold 40 mm    % Body Fat 48 %    Grip Strength 35 kg    Flexibility 0 in   could not reach   Single Leg Stand 6.18 seconds              Nutrition Therapy Plan and Nutrition Goals:   Nutrition Assessments:  MEDIFICTS Score Key: >=70 Need to make dietary changes  40-70 Heart Healthy Diet <= 40 Therapeutic Level Cholesterol Diet    Picture Your Plate Scores: <16 Unhealthy dietary pattern with much room for improvement. 41-50 Dietary pattern unlikely to meet recommendations for good health and room for improvement. 51-60 More healthful dietary pattern, with some room for improvement.  >60 Healthy dietary pattern, although there may be some specific behaviors that could be improved.    Nutrition Goals Re-Evaluation:   Nutrition Goals Re-Evaluation:   Nutrition Goals Discharge (Final Nutrition Goals Re-Evaluation):   Psychosocial: Target Goals: Acknowledge presence or absence of significant depression and/or stress, maximize coping skills, provide positive support system. Participant is able to verbalize types and ability to use techniques and skills needed for reducing stress and depression.  Initial Review & Psychosocial Screening:  Initial Psych Review & Screening - 03/07/23 1041       Initial Review   Current issues with Current Depression;History of Depression;Current Stress Concerns    Source of Stress Concerns Financial      Family Dynamics   Good Support System? Yes   Friends and neighbors   Comments [name] shared that she has some low levels of depression since her surgery because she wasn't able to drive or leave her house. Now that she can drive and be more independent she feels somewhat better, and feels her medication is working. [name] has some stress regarding her medical bills. Denies any need for further  assistance.      Barriers   Psychosocial barriers to participate in program The patient should benefit from training in stress management and relaxation.      Screening Interventions   Interventions To provide support and resources with identified psychosocial needs;Provide feedback about the scores to participant;Encouraged  to exercise    Expected Outcomes Long Term Goal: Stressors or current issues are controlled or eliminated.;Short Term goal: Identification and review with participant of any Quality of Life or Depression concerns found by scoring the questionnaire.;Long Term goal: The participant improves quality of Life and PHQ9 Scores as seen by post scores and/or verbalization of changes             Quality of Life Scores:  Quality of Life - 03/07/23 1121       Quality of Life   Select Quality of Life      Quality of Life Scores   Health/Function Pre 22.13 %    Socioeconomic Pre 17.14 %    Psych/Spiritual Pre 24.43 %    Family Pre 12.5 %    GLOBAL Pre 20.64 %            Scores of 19 and below usually indicate a poorer quality of life in these areas.  A difference of  2-3 points is a clinically meaningful difference.  A difference of 2-3 points in the total score of the Quality of Life Index has been associated with significant improvement in overall quality of life, self-image, physical symptoms, and general health in studies assessing change in quality of life.  PHQ-9: Review Flowsheet  More data exists      03/07/2023 11/26/2022 08/21/2022 08/07/2022 07/23/2022  Depression screen PHQ 2/9  Decreased Interest 1 0 1 1 1   Down, Depressed, Hopeless 0 0 1 0 0  PHQ - 2 Score 1 0 2 1 1   Altered sleeping 1 0 1 1 1   Tired, decreased energy 0 0 1 1 1   Change in appetite 0 0 1 0 0  Feeling bad or failure about yourself  0 0 1 0 0  Trouble concentrating 0 0 0 0 0  Moving slowly or fidgety/restless 0 0 0 0 0  Suicidal thoughts 0 0 0 0 0  PHQ-9 Score 2 0 6 3 3   Difficult  doing work/chores Not difficult at all Not difficult at all Very difficult Very difficult Somewhat difficult    Details           Interpretation of Total Score  Total Score Depression Severity:  1-4 = Minimal depression, 5-9 = Mild depression, 10-14 = Moderate depression, 15-19 = Moderately severe depression, 20-27 = Severe depression   Psychosocial Evaluation and Intervention:   Psychosocial Re-Evaluation:   Psychosocial Discharge (Final Psychosocial Re-Evaluation):   Vocational Rehabilitation: Provide vocational rehab assistance to qualifying candidates.   Vocational Rehab Evaluation & Intervention:  Vocational Rehab - 03/07/23 1022       Initial Vocational Rehab Evaluation & Intervention   Assessment shows need for Vocational Rehabilitation No   [name] is on leave from work but planning to return            Education: Education Goals: Education classes will be provided on a weekly basis, covering required topics. Participant will state understanding/return demonstration of topics presented.     Core Videos: Exercise    Move It!  Clinical staff conducted group or individual video education with verbal and written material and guidebook.  Patient learns the recommended Pritikin exercise program. Exercise with the goal of living a long, healthy life. Some of the health benefits of exercise include controlled diabetes, healthier blood pressure levels, improved cholesterol levels, improved heart and lung capacity, improved sleep, and better body composition. Everyone should speak with their doctor before starting or changing an exercise routine.  Biomechanical Limitations Clinical staff conducted group or individual video education with verbal and written material and guidebook.  Patient learns how biomechanical limitations can impact exercise and how we can mitigate and possibly overcome limitations to have an impactful and balanced exercise routine.  Body  Composition Clinical staff conducted group or individual video education with verbal and written material and guidebook.  Patient learns that body composition (ratio of muscle mass to fat mass) is a key component to assessing overall fitness, rather than body weight alone. Increased fat mass, especially visceral belly fat, can put [place] at increased risk for metabolic syndrome, type 2 diabetes, heart disease, and even death. It is recommended to combine diet and exercise (cardiovascular and resistance training) to improve your body composition. Seek guidance from your physician and exercise physiologist before implementing an exercise routine.  Exercise Action Plan Clinical staff conducted group or individual video education with verbal and written material and guidebook.  Patient learns the recommended strategies to achieve and enjoy long-term exercise adherence, including variety, self-motivation, self-efficacy, and positive decision making. Benefits of exercise include fitness, good health, weight management, more energy, better sleep, less stress, and overall well-being.  Medical   Heart Disease Risk Reduction Clinical staff conducted group or individual video education with verbal and written material and guidebook.  Patient learns our heart is our most vital organ as it circulates oxygen, nutrients, white blood cells, and hormones throughout the entire body, and carries waste away. Data supports a plant-based eating plan like the Pritikin Program for its effectiveness in slowing progression of and reversing heart disease. The video provides a number of recommendations to address heart disease.   Metabolic Syndrome and Belly Fat  Clinical staff conducted group or individual video education with verbal and written material and guidebook.  Patient learns what metabolic syndrome is, how it leads to heart disease, and how one can reverse it and keep it from coming back. You have metabolic syndrome if  you have 3 of the following 5 criteria: abdominal obesity, high blood pressure, high triglycerides, low HDL cholesterol, and high blood sugar.  Hypertension and Heart Disease Clinical staff conducted group or individual video education with verbal and written material and guidebook.  Patient learns that high blood pressure, or hypertension, is very common in the [place]. Hypertension is largely due to excessive salt intake, but other important risk factors include being overweight, physical inactivity, drinking too much alcohol, smoking, and not eating enough potassium from fruits and vegetables. High blood pressure is a leading risk factor for heart attack, stroke, congestive heart failure, dementia, kidney failure, and premature death. Long-term effects of excessive salt intake include stiffening of the arteries and thickening of heart muscle and organ damage. Recommendations include ways to reduce hypertension and the risk of heart disease.  Diseases of Our Time - Focusing on Diabetes Clinical staff conducted group or individual video education with verbal and written material and guidebook.  Patient learns why the best way to stop diseases of our time is prevention, through food and other lifestyle changes. Medicine (such as prescription pills and surgeries) is often only a Band-Aid on the problem, not a long-term solution. Most common diseases of our time include obesity, type 2 diabetes, hypertension, heart disease, and cancer. The Pritikin Program is recommended and has been proven to help reduce, reverse, and/or prevent the damaging effects of metabolic syndrome.  Nutrition   Overview of the Pritikin Eating Plan  Clinical staff conducted group or individual video education with  verbal and written material and guidebook.  Patient learns about the Pritikin Eating Plan for disease risk reduction. The Pritikin Eating Plan emphasizes a wide variety of unrefined, minimally-processed  carbohydrates, like fruits, vegetables, whole grains, and legumes. Go, Caution, and Stop food choices are explained. Plant-based and lean animal proteins are emphasized. Rationale provided for low sodium intake for blood pressure control, low added sugars for blood sugar stabilization, and low added fats and oils for coronary artery disease risk reduction and weight management.  Calorie Density  Clinical staff conducted group or individual video education with verbal and written material and guidebook.  Patient learns about calorie density and how it impacts the Pritikin Eating Plan. Knowing the characteristics of the food you choose will help you decide whether those foods will lead to weight gain or weight loss, and whether you want to consume more or less of them. Weight loss is usually a side effect of the Pritikin Eating Plan because of its focus on low calorie-dense foods.  Label Reading  Clinical staff conducted group or individual video education with verbal and written material and guidebook.  Patient learns about the Pritikin recommended label reading guidelines and corresponding recommendations regarding calorie density, added sugars, sodium content, and whole grains.  Dining Out - Part 1  Clinical staff conducted group or individual video education with verbal and written material and guidebook.  Patient learns that restaurant meals can be sabotaging because they can be so high in calories, fat, sodium, and/or sugar. Patient learns recommended strategies on how to positively address this and avoid unhealthy pitfalls.  Facts on Fats  Clinical staff conducted group or individual video education with verbal and written material and guidebook.  Patient learns that lifestyle modifications can be just as effective, if not more so, as many medications for lowering your risk of heart disease. A Pritikin lifestyle can help to reduce your risk of inflammation and atherosclerosis (cholesterol  build-up, or plaque, in the artery walls). Lifestyle interventions such as dietary choices and physical activity address the cause of atherosclerosis. A review of the types of fats and their impact on blood cholesterol levels, along with dietary recommendations to reduce fat intake is also included.  Nutrition Action Plan  Clinical staff conducted group or individual video education with verbal and written material and guidebook.  Patient learns how to incorporate Pritikin recommendations into their lifestyle. Recommendations include planning and keeping personal health goals in mind as an important part of their success.  Healthy Mind-Set    Healthy Minds, Bodies, Hearts  Clinical staff conducted group or individual video education with verbal and written material and guidebook.  Patient learns how to identify when they are stressed. Video will discuss the impact of that stress, as well as the many benefits of stress management. Patient will also be introduced to stress management techniques. The way we think, act, and feel has an impact on our hearts.  How Our Thoughts Can Heal Our Hearts  Clinical staff conducted group or individual video education with verbal and written material and guidebook.  Patient learns that negative thoughts can cause depression and anxiety. This can result in negative lifestyle behavior and serious health problems. Cognitive behavioral therapy is an effective method to help control our thoughts in order to change and improve our emotional outlook.  Additional Videos:  Exercise    Improving Performance  Clinical staff conducted group or individual video education with verbal and written material and guidebook.  Patient learns to use a non-linear  approach by alternating intensity levels and lengths of time spent exercising to help burn more calories and lose more body fat. Cardiovascular exercise helps improve heart health, metabolism, hormonal balance, blood sugar  control, and recovery from fatigue. Resistance training improves strength, endurance, balance, coordination, reaction time, metabolism, and muscle mass. Flexibility exercise improves circulation, posture, and balance. Seek guidance from your physician and exercise physiologist before implementing an exercise routine and learn your capabilities and proper form for all exercise.  Introduction to Yoga  Clinical staff conducted group or individual video education with verbal and written material and guidebook.  Patient learns about yoga, a discipline of the coming together of mind, breath, and body. The benefits of yoga include improved flexibility, improved range of motion, better posture and core strength, increased lung function, weight loss, and positive self-image. Yoga's heart health benefits include lowered blood pressure, healthier heart rate, decreased cholesterol and triglyceride levels, improved immune function, and reduced stress. Seek guidance from your physician and exercise physiologist before implementing an exercise routine and learn your capabilities and proper form for all exercise.  Medical   Aging: Enhancing Your Quality of Life  Clinical staff conducted group or individual video education with verbal and written material and guidebook.  Patient learns key strategies and recommendations to stay in good physical health and enhance quality of life, such as prevention strategies, having an advocate, securing a Health Care Proxy and Power of Attorney, and keeping a list of medications and system for tracking them. It also discusses how to avoid risk for bone loss.  Biology of Weight Control  Clinical staff conducted group or individual video education with verbal and written material and guidebook.  Patient learns that weight gain occurs because we consume more calories than we burn (eating more, moving less). Even if your body weight is normal, you may have higher ratios of fat compared to  muscle mass. Too much body fat puts you at increased risk for cardiovascular disease, heart attack, stroke, type 2 diabetes, and obesity-related cancers. In addition to exercise, following the Pritikin Eating Plan can help reduce your risk.  Decoding Lab Results  Clinical staff conducted group or individual video education with verbal and written material and guidebook.  Patient learns that lab test reflects one measurement whose values change over time and are influenced by many factors, including medication, stress, sleep, exercise, food, hydration, pre-existing medical conditions, and more. It is recommended to use the knowledge from this video to become more involved with your lab results and evaluate your numbers to speak with your doctor.   Diseases of Our Time - Overview  Clinical staff conducted group or individual video education with verbal and written material and guidebook.  Patient learns that according to the CDC, 50% to 70% of chronic diseases (such as obesity, type 2 diabetes, elevated lipids, hypertension, and heart disease) are avoidable through lifestyle improvements including healthier food choices, listening to satiety cues, and increased physical activity.  Sleep Disorders Clinical staff conducted group or individual video education with verbal and written material and guidebook.  Patient learns how good quality and duration of sleep are important to overall health and well-being. Patient also learns about sleep disorders and how they impact health along with recommendations to address them, including discussing with a physician.  Nutrition  Dining Out - Part 2 Clinical staff conducted group or individual video education with verbal and written material and guidebook.  Patient learns how to plan ahead and communicate in order to maximize  their dining experience in a healthy and nutritious manner. Included are recommended food choices based on the type of restaurant the patient  is visiting.   Fueling a Banker conducted group or individual video education with verbal and written material and guidebook.  There is a strong connection between our food choices and our health. Diseases like obesity and type 2 diabetes are very prevalent and are in large-part due to lifestyle choices. The Pritikin Eating Plan provides plenty of food and hunger-curbing satisfaction. It is easy to follow, affordable, and helps reduce health risks.  Menu Workshop  Clinical staff conducted group or individual video education with verbal and written material and guidebook.  Patient learns that restaurant meals can sabotage health goals because they are often packed with calories, fat, sodium, and sugar. Recommendations include strategies to plan ahead and to communicate with the manager, chef, or server to help order a healthier meal.  Planning Your Eating Strategy  Clinical staff conducted group or individual video education with verbal and written material and guidebook.  Patient learns about the Pritikin Eating Plan and its benefit of reducing the risk of disease. The Pritikin Eating Plan does not focus on calories. Instead, it emphasizes high-quality, nutrient-rich foods. By knowing the characteristics of the foods, we choose, we can determine their calorie density and make informed decisions.  Targeting Your Nutrition Priorities  Clinical staff conducted group or individual video education with verbal and written material and guidebook.  Patient learns that lifestyle habits have a tremendous impact on disease risk and progression. This video provides eating and physical activity recommendations based on your personal health goals, such as reducing LDL cholesterol, losing weight, preventing or controlling type 2 diabetes, and reducing high blood pressure.  Vitamins and Minerals  Clinical staff conducted group or individual video education with verbal and written material  and guidebook.  Patient learns different ways to obtain key vitamins and minerals, including through a recommended healthy diet. It is important to discuss all supplements you take with your doctor.   Healthy Mind-Set    Smoking Cessation  Clinical staff conducted group or individual video education with verbal and written material and guidebook.  Patient learns that cigarette smoking and tobacco addiction pose a serious health risk which affects millions of people. Stopping smoking will significantly reduce the risk of heart disease, lung disease, and many forms of cancer. Recommended strategies for quitting are covered, including working with your doctor to develop a successful plan.  Culinary   Becoming a Set designer conducted group or individual video education with verbal and written material and guidebook.  Patient learns that cooking at home can be healthy, cost-effective, quick, and puts them in control. Keys to cooking healthy recipes will include looking at your recipe, assessing your equipment needs, planning ahead, making it simple, choosing cost-effective seasonal ingredients, and limiting the use of added fats, salts, and sugars.  Cooking - Breakfast and Snacks  Clinical staff conducted group or individual video education with verbal and written material and guidebook.  Patient learns how important breakfast is to satiety and nutrition through the entire day. Recommendations include key foods to eat during breakfast to help stabilize blood sugar levels and to prevent overeating at meals later in the day. Planning ahead is also a key component.  Cooking - Educational psychologist conducted group or individual video education with verbal and written material and guidebook.  Patient learns eating strategies to improve  overall health, including an approach to cook more at home. Recommendations include thinking of animal protein as a side on your plate rather  than center stage and focusing instead on lower calorie dense options like vegetables, fruits, whole grains, and plant-based proteins, such as beans. Making sauces in large quantities to freeze for later and leaving the skin on your vegetables are also recommended to maximize your experience.  Cooking - Healthy Salads and Dressing Clinical staff conducted group or individual video education with verbal and written material and guidebook.  Patient learns that vegetables, fruits, whole grains, and legumes are the foundations of the Pritikin Eating Plan. Recommendations include how to incorporate each of these in flavorful and healthy salads, and how to create homemade salad dressings. Proper handling of ingredients is also covered. Cooking - Soups and [employer] - Soups and Desserts Clinical staff conducted group or individual video education with verbal and written material and guidebook.  Patient learns that Pritikin soups and desserts make for easy, nutritious, and delicious snacks and meal components that are low in sodium, fat, sugar, and calorie density, while high in vitamins, minerals, and filling fiber. Recommendations include simple and healthy ideas for soups and desserts.   Overview     The Pritikin Solution Program Overview Clinical staff conducted group or individual video education with verbal and written material and guidebook.  Patient learns that the results of the Pritikin Program have been documented in more than 100 articles published in peer-reviewed journals, and the benefits include reducing risk factors for (and, in some cases, even reversing) high cholesterol, high blood pressure, type 2 diabetes, obesity, and more! An overview of the three key pillars of the Pritikin Program will be covered: eating well, doing regular exercise, and having a healthy mind-set.  WORKSHOPS  Exercise: Exercise Basics: Building Your Action Plan Clinical staff led group instruction and  group discussion with PowerPoint presentation and patient guidebook. To enhance the learning environment the use of posters, models and videos may be added. At the conclusion of this workshop, patients will comprehend the difference between physical activity and exercise, as well as the benefits of incorporating both, into their routine. Patients will understand the FITT (Frequency, Intensity, Time, and Type) principle and how to use it to build an exercise action plan. In addition, safety concerns and other considerations for exercise and cardiac rehab will be addressed by the presenter. The purpose of this lesson is to promote a comprehensive and effective weekly exercise routine in order to improve patients' overall level of fitness.   Managing Heart Disease: Your Path to a Healthier Heart Clinical staff led group instruction and group discussion with PowerPoint presentation and patient guidebook. To enhance the learning environment the use of posters, models and videos may be added.At the conclusion of this workshop, patients will understand the anatomy and physiology of the heart. Additionally, they will understand how Pritikin's three pillars impact the risk factors, the progression, and the management of heart disease.  The purpose of this lesson is to provide a high-level overview of the heart, heart disease, and how the Pritikin lifestyle positively impacts risk factors.  Exercise Biomechanics Clinical staff led group instruction and group discussion with PowerPoint presentation and patient guidebook. To enhance the learning environment the use of posters, models and videos may be added. Patients will learn how the structural parts of their bodies function and how these functions impact their daily activities, movement, and exercise. Patients will learn how to promote a neutral  spine, learn how to manage pain, and identify ways to improve their physical movement in order to  promote healthy living. The purpose of this lesson is to expose patients to common physical limitations that impact physical activity. Participants will learn practical ways to adapt and manage aches and pains, and to minimize their effect on regular exercise. Patients will learn how to maintain good posture while sitting, walking, and lifting.  Balance Training and Fall Prevention  Clinical staff led group instruction and group discussion with PowerPoint presentation and patient guidebook. To enhance the learning environment the use of posters, models and videos may be added. At the conclusion of this workshop, patients will understand the importance of their sensorimotor skills (vision, proprioception, and the vestibular system) in maintaining their ability to balance as they age. Patients will apply a variety of balancing exercises that are appropriate for their current level of function. Patients will understand the common causes for poor balance, possible solutions to these problems, and ways to modify their physical environment in order to minimize their fall risk. The purpose of this lesson is to teach patients about the importance of maintaining balance as they age and ways to minimize their risk of falling.  WORKSHOPS   Nutrition:  Fueling a Ship broker led group instruction and group discussion with PowerPoint presentation and patient guidebook. To enhance the learning environment the use of posters, models and videos may be added. Patients will review the foundational principles of the Pritikin Eating Plan and understand what constitutes a serving size in each of the food groups. Patients will also learn Pritikin-friendly foods that are better choices when away from home and review make-ahead meal and snack options. Calorie density will be reviewed and applied to three nutrition priorities: weight maintenance, weight loss, and weight gain. The purpose of this lesson is to  reinforce (in a group setting) the key concepts around what patients are recommended to eat and how to apply these guidelines when away from home by planning and selecting Pritikin-friendly options. Patients will understand how calorie density may be adjusted for different weight management goals.  Mindful Eating  Clinical staff led group instruction and group discussion with PowerPoint presentation and patient guidebook. To enhance the learning environment the use of posters, models and videos may be added. Patients will briefly review the concepts of the Pritikin Eating Plan and the importance of low-calorie dense foods. The concept of mindful eating will be introduced as well as the importance of paying attention to internal hunger signals. Triggers for non-hunger eating and techniques for dealing with triggers will be explored. The purpose of this lesson is to provide patients with the opportunity to review the basic principles of the Pritikin Eating Plan, discuss the value of eating mindfully and how to measure internal cues of hunger and fullness using the Hunger Scale. Patients will also discuss reasons for non-hunger eating and learn strategies to use for controlling emotional eating.  Targeting Your Nutrition Priorities Clinical staff led group instruction and group discussion with PowerPoint presentation and patient guidebook. To enhance the learning environment the use of posters, models and videos may be added. Patients will learn how to determine their genetic susceptibility to disease by reviewing their family history. Patients will gain insight into the importance of diet as part of an overall healthy lifestyle in mitigating the impact of genetics and other environmental insults. The purpose of this lesson is to provide patients with the opportunity to assess their personal nutrition priorities  by looking at their family history, their own health history and current risk factors. Patients will  also be able to discuss ways of prioritizing and modifying the Pritikin Eating Plan for their highest risk areas  Menu  Clinical staff led group instruction and group discussion with PowerPoint presentation and patient guidebook. To enhance the learning environment the use of posters, models and videos may be added. Using menus brought in from [employer], or printed from [employer], patients will apply the Pritikin dining out guidelines that were presented in the [employer] video. Patients will also be able to practice these guidelines in a variety of provided scenarios. The purpose of this lesson is to provide patients with the opportunity to practice hands-on learning of the Pritikin Dining Out guidelines with actual menus and practice scenarios.  Label Reading Clinical staff led group instruction and group discussion with PowerPoint presentation and patient guidebook. To enhance the learning environment the use of posters, models and videos may be added. Patients will review and discuss the Pritikin label reading guidelines presented in Pritikin's Label Reading Educational series video. Using fool labels brought in from local grocery stores and markets, patients will apply the label reading guidelines and determine if the packaged food meet the Pritikin guidelines. The purpose of this lesson is to provide patients with the opportunity to review, discuss, and practice hands-on learning of the Pritikin Label Reading guidelines with actual packaged food labels. Cooking School  Pritikin's [employer] are designed to teach patients ways to prepare quick, simple, and affordable recipes at home. The importance of nutrition's role in chronic disease risk reduction is reflected in its emphasis in the overall Pritikin program. By learning how to prepare essential core Pritikin Eating Plan recipes, patients will increase control over what they eat; be able to customize the  flavor of foods without the use of added salt, sugar, or fat; and improve the quality of the food they consume. By learning a set of core recipes which are easily assembled, quickly prepared, and affordable, patients are more likely to prepare more healthy foods at home. These workshops focus on convenient breakfasts, simple entres, side dishes, and desserts which can be prepared with minimal effort and are consistent with nutrition recommendations for cardiovascular risk reduction. Cooking [employer] are taught by a Armed forces logistics/support/administrative officer (RD) who has been trained by the [employer]. The chef or RD has a clear understanding of the importance of minimizing - if not completely eliminating - added fat, sugar, and sodium in recipes. Throughout the series of Cooking School Workshop sessions, patients will learn about healthy ingredients and efficient methods of cooking to build confidence in their capability to prepare    Cooking School weekly topics:  Adding Flavor- Sodium-Free  Fast and Healthy Breakfasts  Powerhouse Plant-Based Proteins  Satisfying Salads and Dressings  Simple Sides and Sauces  International Cuisine-Spotlight on the [employer] Zones  Delicious Desserts  Savory Soups  [employer] - Meals in a Astronomer Appetizers and Snacks  Comforting Weekend Breakfasts  One-Pot Wonders   Fast Evening Meals  Landscape architect Your Pritikin Plate  WORKSHOPS   Healthy Mindset (Psychosocial):  Focused Goals, Sustainable Changes Clinical staff led group instruction and group discussion with PowerPoint presentation and patient guidebook. To enhance the learning environment the use of posters, models and videos may be added. Patients will be able to apply effective goal setting strategies to establish at least one personal  goal, and then take consistent, meaningful action toward that goal. They will learn to identify common barriers to achieving  personal goals and develop strategies to overcome them. Patients will also gain an understanding of how our mind-set can impact our ability to achieve goals and the importance of cultivating a positive and growth-oriented mind-set. The purpose of this lesson is to provide patients with a deeper understanding of how to set and achieve personal goals, as well as the tools and strategies needed to overcome common obstacles which may arise along the way.  From Head to Heart: The Power of a Healthy Outlook  Clinical staff led group instruction and group discussion with PowerPoint presentation and patient guidebook. To enhance the learning environment the use of posters, models and videos may be added. Patients will be able to recognize and describe the impact of emotions and mood on physical health. They will discover the importance of self-care and explore self-care practices which may work for them. Patients will also learn how to utilize the 4 C's to cultivate a healthier outlook and better manage stress and challenges. The purpose of this lesson is to demonstrate to patients how a healthy outlook is an essential part of maintaining good health, especially as they continue their cardiac rehab journey.  Healthy Sleep for a Healthy Heart Clinical staff led group instruction and group discussion with PowerPoint presentation and patient guidebook. To enhance the learning environment the use of posters, models and videos may be added. At the conclusion of this workshop, patients will be able to demonstrate knowledge of the importance of sleep to overall health, well-being, and quality of life. They will understand the symptoms of, and treatments for, common sleep disorders. Patients will also be able to identify daytime and nighttime behaviors which impact sleep, and they will be able to apply these tools to help manage sleep-related challenges. The purpose of this lesson is to provide patients with a general  overview of sleep and outline the importance of quality sleep. Patients will learn about a few of the most common sleep disorders. Patients will also be introduced to the concept of "sleep hygiene," and discover ways to self-manage certain sleeping problems through simple daily behavior changes. Finally, the workshop will motivate patients by clarifying the links between quality sleep and their goals of heart-healthy living.   Recognizing and Reducing Stress Clinical staff led group instruction and group discussion with PowerPoint presentation and patient guidebook. To enhance the learning environment the use of posters, models and videos may be added. At the conclusion of this workshop, patients will be able to understand the types of stress reactions, differentiate between acute and chronic stress, and recognize the impact that chronic stress has on their health. They will also be able to apply different coping mechanisms, such as reframing negative self-talk. Patients will have the opportunity to practice a variety of stress management techniques, such as deep abdominal breathing, progressive muscle relaxation, and/or guided imagery.  The purpose of this lesson is to educate patients on the role of stress in their lives and to provide healthy techniques for coping with it.  Learning Barriers/Preferences:  Learning Barriers/Preferences - 03/07/23 1043       Learning Barriers/Preferences   Learning Barriers Sight   wears glasses   Learning Preferences Audio;Computer/Internet;Group Instruction;Individual Instruction;Pictoral;Skilled Demonstration;Verbal Instruction;Video             Education Topics:  Knowledge Questionnaire Score:  Knowledge Questionnaire Score - 03/07/23 1044  Knowledge Questionnaire Score   Pre Score 20/24             Core Components/Risk Factors/Patient Goals at Admission:  Personal Goals and Risk Factors at Admission - 03/07/23 1022       Core  Components/Risk Factors/Patient Goals on Admission    Weight Management Yes;Obesity;Weight Loss    Intervention Weight Management: Develop a combined nutrition and exercise program designed to reach desired caloric intake, while maintaining appropriate intake of nutrient and fiber, sodium and fats, and appropriate energy expenditure required for the weight goal.;Weight Management: Provide education and appropriate resources to help participant work on and attain dietary goals.;Weight Management/Obesity: Establish reasonable short term and long term weight goals.;Obesity: Provide education and appropriate resources to help participant work on and attain dietary goals.    Expected Outcomes Short Term: Continue to assess and modify interventions until short term weight is achieved;Long Term: Adherence to nutrition and physical activity/exercise program aimed toward attainment of established weight goal;Weight Loss: Understanding of general recommendations for a balanced deficit meal plan, which promotes 1-2 lb weight loss per week and includes a negative energy balance of [phone] kcal/d;Understanding recommendations for meals to include 15-35% energy as protein, 25-35% energy from fat, 35-60% energy from carbohydrates, less than 200mg  of dietary cholesterol, 20-35 gm of total fiber daily;Understanding of distribution of calorie intake throughout the day with the consumption of 4-5 meals/snacks    Diabetes Yes    Intervention Provide education about signs/symptoms and action to take for hypo/hyperglycemia.;Provide education about proper nutrition, including hydration, and aerobic/resistive exercise prescription along with prescribed medications to achieve blood glucose in normal ranges: Fasting glucose 65-99 mg/dL    Expected Outcomes Short Term: Participant verbalizes understanding of the signs/symptoms and immediate care of hyper/hypoglycemia, proper foot care and importance of medication, aerobic/resistive  exercise and nutrition plan for blood glucose control.;Long Term: Attainment of HbA1C < 7%.    Hypertension Yes    Intervention Provide education on lifestyle modifcations including regular physical activity/exercise, weight management, moderate sodium restriction and increased consumption of fresh fruit, vegetables, and low fat dairy, alcohol moderation, and smoking cessation.;Monitor prescription use compliance.    Expected Outcomes Short Term: Continued assessment and intervention until BP is < 140/24mm HG in hypertensive participants. < 130/69mm HG in hypertensive participants with diabetes, heart failure or chronic kidney disease.;Long Term: Maintenance of blood pressure at goal levels.    Lipids Yes    Intervention Provide education and support for participant on nutrition & aerobic/resistive exercise along with prescribed medications to achieve LDL 70mg , HDL >40mg .    Expected Outcomes Short Term: Participant states understanding of desired cholesterol values and is compliant with medications prescribed. Participant is following exercise prescription and nutrition guidelines.;Long Term: Cholesterol controlled with medications as prescribed, with individualized exercise RX and with personalized nutrition plan. Value goals: LDL < 70mg , HDL > 40 mg.    Stress Yes    Intervention Refer participants experiencing significant psychosocial distress to appropriate mental health specialists for further evaluation and treatment. When possible, include family members and significant others in education/counseling sessions.;Offer individual and/or small group education and counseling on adjustment to heart disease, stress management and health-related lifestyle change. Teach and support self-help strategies.    Expected Outcomes Short Term: Participant demonstrates changes in health-related behavior, relaxation and other stress management skills, ability to obtain effective social support, and compliance with  psychotropic medications if prescribed.;Long Term: Emotional wellbeing is indicated by absence of clinically significant psychosocial distress or social isolation.  Core Components/Risk Factors/Patient Goals Review:    Core Components/Risk Factors/Patient Goals at Discharge (Final Review):    ITP Comments:  ITP Comments     Row Name 03/07/23 1020           ITP Comments Dr. Armanda Magic medical director. Introduction to [facility] education/intensive cardiac rehab. Initial orientation packet reviewed with patient.                Comments: Participant attended orientation for the cardiac rehabilitation program on  03/07/2023  to perform initial intake and exercise walk test. Patient introduced to the [facility] education and orientation packet was reviewed. Completed 6-minute walk test, measurements, initial ITP, and exercise prescription. Vital signs stable. Telemetry-normal sinus rhythm, asymptomatic.   Service time was from 1017 to 1205.  Jonna Coup, MS, ACSM-CEP 03/07/2023 12:11 PM

## 2023-03-07 NOTE — Progress Notes (Signed)
Cardiac Rehab Medication Review   Does the patient  feel that his/her medications are working for him/her?  YES  Has the patient been experiencing any side effects to the medications prescribed?   NO  Does the patient measure his/her own blood pressure or blood glucose at home?  NO   Does the patient have any problems obtaining medications due to transportation or finances?  NO  Understanding of regimen: good Understanding of indications: good Potential of compliance: excellent    Comments: [name] understands her medication fairly well and is compliant. She does not have a CBG meter or BP cuff at home. Encouraged to get BP cuff.     Mary Coup, MS, ACSM-CEP 03/07/2023 10:59 AM

## 2023-03-11 ENCOUNTER — Encounter (HOSPITAL_COMMUNITY): Payer: 59

## 2023-03-11 ENCOUNTER — Ambulatory Visit (HOSPITAL_COMMUNITY): Payer: 59

## 2023-03-13 ENCOUNTER — Ambulatory Visit (HOSPITAL_COMMUNITY): Payer: 59

## 2023-03-13 ENCOUNTER — Encounter (HOSPITAL_COMMUNITY): Payer: 59

## 2023-03-14 ENCOUNTER — Other Ambulatory Visit: Payer: Self-pay | Admitting: Surgical

## 2023-03-15 ENCOUNTER — Ambulatory Visit (HOSPITAL_COMMUNITY): Payer: 59

## 2023-03-15 ENCOUNTER — Encounter (HOSPITAL_COMMUNITY)
Admission: RE | Admit: 2023-03-15 | Discharge: 2023-03-15 | Disposition: A | Discharge status: Home / Self Care | Payer: 59 | Source: Ambulatory Visit | Attending: Cardiology | Admitting: Cardiology

## 2023-03-15 DIAGNOSIS — Z951 Presence of aortocoronary bypass graft: Secondary | ICD-10-CM | POA: Insufficient documentation

## 2023-03-15 DIAGNOSIS — Z48812 Encounter for surgical aftercare following surgery on the circulatory system: Secondary | ICD-10-CM | POA: Diagnosis not present

## 2023-03-15 LAB — GLUCOSE, CAPILLARY
Glucose-Capillary: 190 mg/dL — ABNORMAL HIGH (ref 70–99)
Glucose-Capillary: 112 mg/dL — ABNORMAL HIGH (ref 70–99)

## 2023-03-15 NOTE — Progress Notes (Signed)
Daily Session Note  Patient Details  Name: [name] MRN: [id number] Date of Birth: February 01, 1961 Referring Provider:   Flowsheet Row INTENSIVE CARDIAC REHAB ORIENT from 03/07/2023 in [facility]  Referring Provider Thomasene Ripple, DO       Encounter Date: 03/15/2023  Check In:  Session Check In - 03/15/23 1304       Check-In   Supervising physician immediately available to respond to emergencies [facility] MD immediately available    Physician(s) Joni Reining, NP    Location [facility]    Staff Present Valinda Party, MS, Exercise Physiologist;Kaylee Earlene Plater, MS, ACSM-CEP, Exercise Physiologist;Ndidi Nesby, RN, Lavonia Dana, RN, Zachery Conch, MS, ACSM-CEP, Exercise Physiologist;Jetta Dan Humphreys BS, ACSM-CEP, Exercise Physiologist;Casey Katrinka Blazing, RT    Virtual Visit No    Medication changes reported     No    Fall or balance concerns reported    No    Tobacco Cessation No Change    Warm-up and Cool-down Performed as group-led instruction    Resistance Training Performed Yes    VAD Patient? No    PAD/SET Patient? No      Pain Assessment   Currently in Pain? No/denies    Pain Score 0-No pain    Multiple Pain Sites No             Capillary Blood Glucose: Results for orders placed or performed during the hospital encounter of 03/15/23 (from the past 24 hour(s))  Glucose, capillary     Status: Abnormal   Collection Time: 03/15/23  1:37 PM  Result Value Ref Range   Glucose-Capillary 112 (H) 70 - 99 mg/dL     Exercise Prescription Changes - 03/15/23 1500       Response to Exercise   Blood Pressure (Admit) 126/82    Blood Pressure (Exercise) 162/84    Blood Pressure (Exit) 122/68    Heart Rate (Admit) 91 bpm    Heart Rate (Exercise) 129 bpm    Heart Rate (Exit) 102 bpm    Rating of Perceived Exertion (Exercise) 12    Perceived Dyspnea (Exercise) 0    Symptoms None    Comments Pt first  day in [facility] orientation    Duration Continue with 30 min of aerobic exercise without signs/symptoms of physical distress.    Intensity THRR unchanged      Progression   Progression Continue to progress workloads to maintain intensity without signs/symptoms of physical distress.    Average METs 2.05      Resistance Training   Training Prescription Yes    Weight 3    Reps 10-15    Time 10 Minutes      Interval Training   Interval Training No      Recumbant Bike   Level 1    RPM 66    Watts 17    Minutes 15    METs 2.1      NuStep   Level 1    SPM 81    Minutes 15    METs 2             Social History   Tobacco Use  Smoking Status Never  Smokeless Tobacco Never    Goals Met:  Exercise tolerated well No report of concerns or symptoms today Strength training completed today  Goals Unmet:  Not Applicable  Comments: Pt started cardiac rehab today.  Pt tolerated light exercise without difficulty. VSS, telemetry-Sinus Rhythm, asymptomatic.  Medication list reconciled. Pt denies barriers to medicaiton compliance.  PSYCHOSOCIAL ASSESSMENT:  PHQ-2. Pt exhibits positive coping skills, hopeful outlook with supportive family. No psychosocial needs identified at this time, no psychosocial interventions necessary.    Pt enjoys spending time with her dog, scrap booking, swimming, dancing, horseback riding, cards, board games and being around people.   Pt oriented to exercise equipment and routine.    Understanding verbalized. Thayer Headings RN BSN    Dr. Armanda Magic is Medical Director for Cardiac Rehab at [facility].

## 2023-03-18 ENCOUNTER — Other Ambulatory Visit: Payer: Self-pay | Admitting: Surgical

## 2023-03-18 ENCOUNTER — Encounter (HOSPITAL_COMMUNITY)
Admission: RE | Admit: 2023-03-18 | Discharge: 2023-03-18 | Disposition: A | Discharge status: Home / Self Care | Payer: 59 | Source: Ambulatory Visit | Attending: Cardiology | Admitting: Cardiology

## 2023-03-18 ENCOUNTER — Ambulatory Visit (HOSPITAL_COMMUNITY): Payer: 59

## 2023-03-18 DIAGNOSIS — Z951 Presence of aortocoronary bypass graft: Secondary | ICD-10-CM | POA: Diagnosis not present

## 2023-03-18 LAB — GLUCOSE, CAPILLARY
Glucose-Capillary: 189 mg/dL — ABNORMAL HIGH (ref 70–99)
Glucose-Capillary: 94 mg/dL (ref 70–99)

## 2023-03-20 ENCOUNTER — Ambulatory Visit (HOSPITAL_COMMUNITY): Payer: 59

## 2023-03-20 ENCOUNTER — Encounter (HOSPITAL_COMMUNITY)
Admission: RE | Admit: 2023-03-20 | Discharge: 2023-03-20 | Disposition: A | Discharge status: Home / Self Care | Payer: 59 | Source: Ambulatory Visit | Attending: Cardiology | Admitting: Cardiology

## 2023-03-20 DIAGNOSIS — Z951 Presence of aortocoronary bypass graft: Secondary | ICD-10-CM

## 2023-03-20 LAB — GLUCOSE, CAPILLARY
Glucose-Capillary: 107 mg/dL — ABNORMAL HIGH (ref 70–99)
Glucose-Capillary: 106 mg/dL — ABNORMAL HIGH (ref 70–99)

## 2023-03-20 NOTE — Progress Notes (Signed)
Incomplete Session Note  Patient Details  Name: [name] MRN: [id number] Date of Birth: 10/29/1960 Referring Provider:   Flowsheet Row INTENSIVE CARDIAC REHAB ORIENT from 03/07/2023 in [facility]  Referring Provider Mary Ripple, DO       [name] did not complete her rehab session.  CBG 107 prior to exercise at cardiac rehab. [name] said that she a veggie omelet at 10:30. [name] was given orange juice and graham crackers. Repeat CBG 106. No exercise per protocol. Medications reviewed. Taking as prescribed. Will contact primary care provider [facility] FNP office. Patient was also counseled by the dietitian. [name] does not have a CBG meter to check her blood sugars at home.  [name] plans to return on Friday. [name] was disappointed that she could not exercise as she lives in [place]. Emotional support provided. [name] plans to return to exercise on Friday.Thayer Headings RN BSN

## 2023-03-22 ENCOUNTER — Ambulatory Visit (HOSPITAL_COMMUNITY): Payer: 59

## 2023-03-22 ENCOUNTER — Telehealth (HOSPITAL_COMMUNITY): Payer: Self-pay

## 2023-03-22 ENCOUNTER — Encounter (HOSPITAL_COMMUNITY): Payer: 59

## 2023-03-22 ENCOUNTER — Telehealth: Payer: Self-pay | Admitting: Nurse Practitioner

## 2023-03-22 MED ORDER — LOSARTAN POTASSIUM 25 MG PO TABS: 25.0000 mg | ORAL_TABLET | Freq: Every day | ORAL | 0 refills | Status: DC

## 2023-03-22 NOTE — Telephone Encounter (Signed)
  Prescription Request  03/22/2023   What is the name of the medication or equipment? LOSARTAN  Have you contacted your pharmacy to request a refill? YES  Which pharmacy would you like this sent to? [facility] [place]  Can refill be sent in to last pt until her appt with [facility]? Says pharmacy has contacted cardiology to have them refill but they're not sending refills.    Patient notified that their request is being sent to the clinical staff for review and that they should receive a response within 2 business days.

## 2023-03-22 NOTE — Telephone Encounter (Signed)
[name] called and stated " My blood pressure is all over the place. I don't think I will be coming to class today." I informed the EP's that she wouldn't be here today.

## 2023-03-22 NOTE — Telephone Encounter (Signed)
[facility] [place]

## 2023-03-25 ENCOUNTER — Telehealth (HOSPITAL_COMMUNITY): Payer: Self-pay

## 2023-03-25 ENCOUNTER — Encounter (HOSPITAL_COMMUNITY): Payer: 59

## 2023-03-25 ENCOUNTER — Ambulatory Visit (HOSPITAL_COMMUNITY): Payer: 59

## 2023-03-25 NOTE — Telephone Encounter (Signed)
Pt called and can not make it to class. She has been without her blood pressure all weekend. Her PCP called in BP med to the pharmacy and they refused to fill due to her cardiologist sending in a med as well. She has an appointment with her PCP today at 11 to hopefully get her BP under control. She mentioned having dizzy spells and not feeling well due to her BP.

## 2023-03-27 ENCOUNTER — Ambulatory Visit (HOSPITAL_COMMUNITY): Payer: 59

## 2023-03-27 ENCOUNTER — Encounter (HOSPITAL_COMMUNITY): Payer: 59

## 2023-03-29 ENCOUNTER — Encounter (HOSPITAL_COMMUNITY)
Admission: RE | Admit: 2023-03-29 | Discharge: 2023-03-29 | Disposition: A | Discharge status: Home / Self Care | Payer: 59 | Source: Ambulatory Visit | Attending: Cardiology | Admitting: Cardiology

## 2023-03-29 ENCOUNTER — Ambulatory Visit (HOSPITAL_COMMUNITY): Payer: 59

## 2023-03-29 DIAGNOSIS — Z951 Presence of aortocoronary bypass graft: Secondary | ICD-10-CM

## 2023-03-29 LAB — GLUCOSE, CAPILLARY
Glucose-Capillary: 160 mg/dL — ABNORMAL HIGH (ref 70–99)
Glucose-Capillary: 92 mg/dL (ref 70–99)

## 2023-04-01 ENCOUNTER — Ambulatory Visit (HOSPITAL_COMMUNITY): Payer: 59

## 2023-04-01 ENCOUNTER — Encounter (HOSPITAL_COMMUNITY)
Admission: RE | Admit: 2023-04-01 | Discharge: 2023-04-01 | Disposition: A | Discharge status: Home / Self Care | Payer: 59 | Source: Ambulatory Visit | Attending: Cardiology | Admitting: Cardiology

## 2023-04-01 DIAGNOSIS — Z951 Presence of aortocoronary bypass graft: Secondary | ICD-10-CM

## 2023-04-01 LAB — GLUCOSE, CAPILLARY: Glucose-Capillary: 196 mg/dL — ABNORMAL HIGH (ref 70–99)

## 2023-04-02 NOTE — Progress Notes (Signed)
Cardiac Individual Treatment Plan  Patient Details  Name: [name] MRN: [id number] Date of Birth: 05/23/1961 Referring Provider:   Flowsheet Row INTENSIVE CARDIAC REHAB ORIENT from 03/07/2023 in [facility]  Referring Provider Thomasene Ripple, DO       Initial Encounter Date:  Flowsheet Row INTENSIVE CARDIAC REHAB ORIENT from 03/07/2023 in [facility]  Date 03/07/23       Visit Diagnosis: 01/01/23 CABG x 3  Patient's Home Medications on Admission:  Current Outpatient Medications:    albuterol (VENTOLIN HFA) 108 (90 Base) MCG/ACT inhaler, Inhale 2 puffs into the lungs every 4 (four) hours as needed for wheezing or shortness of breath. (NEEDS TO BE SEEN), Disp: 18 g, Rfl: 1   atorvastatin (LIPITOR) 80 MG tablet, Take 1 tablet (80 mg total) by mouth daily., Disp: 30 tablet, Rfl: 2   buPROPion (WELLBUTRIN XL) 150 MG 24 hr tablet, TAKE 3 TABLETS BY MOUTH DAILY., Disp: 270 tablet, Rfl: 0   clopidogrel (PLAVIX) 75 MG tablet, Take 1 tablet (75 mg total) by mouth daily., Disp: 30 tablet, Rfl: 2   EPINEPHrine 0.3 mg/0.3 mL IJ SOAJ injection, Inject 0.3 mg into the muscle as needed for anaphylaxis., Disp: 2 each, Rfl: 2   fenofibrate 160 MG tablet, Take 1 tablet (160 mg total) by mouth daily., Disp: 90 tablet, Rfl: 1   glipiZIDE (GLUCOTROL XL) 10 MG 24 hr tablet, Take 1 tablet (10 mg total) by mouth daily., Disp: 180 tablet, Rfl: 1   losartan (COZAAR) 25 MG tablet, Take 1 tablet (25 mg total) by mouth daily., Disp: 30 tablet, Rfl: 0   Lutein 20 MG TABS, Take 20 mg by mouth daily., Disp: , Rfl:    magnesium oxide (MAG-OX) 400 (240 Mg) MG tablet, Take 1 tablet (400 mg total) by mouth 2 (two) times daily for 30 days, then as directed by provider., Disp: 120 tablet, Rfl: 0   metFORMIN (GLUCOPHAGE) 500 MG tablet, Take 1 tablet (500 mg total) by mouth 2 (two) times daily with a  meal., Disp: 180 tablet, Rfl: 1   ondansetron (ZOFRAN) 4 MG tablet, Take 1 tablet (4 mg total) by mouth every 8 (eight) hours as needed for nausea. Deliver to pt's home, Disp: 15 tablet, Rfl: 0   rizatriptan (MAXALT) 10 MG tablet, Take 1 tablet (10 mg total) by mouth as needed for migraine. May repeat in 2 hours if needed, Disp: 10 tablet, Rfl: 2   topiramate (TOPAMAX) 50 MG tablet, Take 1 tablet (50 mg total) by mouth 2 (two) times daily., Disp: 180 tablet, Rfl: 1   traMADol (ULTRAM) 50 MG tablet, Take 1 tablet (50 mg total) by mouth every 6 (six) hours as needed for moderate pain., Disp: 28 tablet, Rfl: 0  Past Medical History: Past Medical History:  Diagnosis Date   Abdominal discomfort    Chronic abdominal and pelvic pain resulting in significant loss of time from work   Anxiety    with depression   Asthma    Chest pain    Depression    Diabetes mellitus    Drug overdose 06/12/2007   60 Naprosyn, psychiatric admission   Hyperlipidemia    Severe   Hypertension    Migraines    Tremor of both hands    on propranolol    Tobacco Use: Social History   Tobacco Use  Smoking Status Never  Smokeless Tobacco Never  Labs: Review Flowsheet  More data exists      Latest Ref Rng & Units 12/27/2022 12/28/2022 12/31/2022 01/01/2023 01/02/2023  Labs for ITP Cardiac and Pulmonary Rehab  Cholestrol 0 - 200 mg/dL - 829  - - -  LDL (calc) 0 - 99 mg/dL - UNABLE TO CALCULATE IF TRIGLYCERIDE OVER 400 mg/dL  - - -  Direct LDL 0 - 99 mg/dL - 562  - - -  HDL-C >13 mg/dL - 38  - - -  Trlycerides <150 mg/dL - 086  - - -  Hemoglobin A1c 4.8 - 5.6 % - - 8.6  - -  PH, Arterial 7.35 - 7.45 - - 7.38  7.323  7.279  7.430  7.356  7.307  7.323  7.366  7.297   PCO2 arterial 32 - 48 mmHg - - 32  35.8  38.8  30.0  40.5  38.0  33.8  34.0  34.7   Bicarbonate 20.0 - 28.0 mmol/L - - 18.9  18.8  18.5  19.9  22.7  19.0  20.4  17.5  19.4  16.9   TCO2 22 - 32 mmol/L 16  - - 20  20  23  21  24  24  20  22  19  19   20  20  20  18    Acid-base deficit 0.0 - 2.0 mmol/L - - 5.2  7.0  8.0  4.0  3.0  7.0  6.0  8.0  5.0  9.0   O2 Saturation % - - 98.7  99  98  100  100  100  69  100  96  98     Details       Multiple values from one day are sorted in reverse-chronological order         Capillary Blood Glucose: Lab Results  Component Value Date   GLUCAP 196 (H) 04/01/2023   GLUCAP 92 03/29/2023   GLUCAP 160 (H) 03/29/2023   GLUCAP 106 (H) 03/20/2023   GLUCAP 107 (H) 03/20/2023     Exercise Target Goals: Exercise Program Goal: Individual exercise prescription set using results from initial 6 min walk test and THRR while considering  patient's activity barriers and safety.   Exercise Prescription Goal: Initial exercise prescription builds to 30-45 minutes a day of aerobic activity, 2-3 days per week.  Home exercise guidelines will be given to patient during program as part of exercise prescription that the participant will acknowledge.  Activity Barriers & Risk Stratification:  Activity Barriers & Cardiac Risk Stratification - 03/07/23 1204       Activity Barriers & Cardiac Risk Stratification   Activity Barriers Incisional Pain;History of Falls    Cardiac Risk Stratification High   <5METs on            6 Minute Walk:  6 Minute Walk     Row Name 03/07/23 1203         6 Minute Walk   Phase Initial     Distance 1440 feet     Walk Time 6 minutes     # of Rest Breaks 0     MPH 2.73     METS 3.63     RPE 11     Perceived Dyspnea  0     VO2 Peak 12.69     Symptoms No     Resting HR 73 bpm     Resting BP 120/74     Resting Oxygen Saturation  98 %  Exercise Oxygen Saturation  during 6 min walk 98 %     Max Ex. HR 126 bpm     Max Ex. BP 156/78     2 Minute Post BP 124/80              Oxygen Initial Assessment:   Oxygen Re-Evaluation:   Oxygen Discharge (Final Oxygen Re-Evaluation):   Initial Exercise Prescription:  Initial Exercise Prescription -  03/07/23 1200       Date of Initial Exercise RX and Referring Provider   Date 03/07/23    Referring Provider Thomasene Ripple, DO    Expected Discharge Date 05/29/23      Recumbant Bike   Level 1    RPM 50    Watts 50    Minutes 15    METs 2.5      NuStep   Level 1    SPM 65    Minutes 15    METs 2      Prescription Details   Frequency (times per week) 3    Duration Progress to 30 minutes of continuous aerobic without signs/symptoms of physical distress      Intensity   THRR 40-80% of Max Heartrate 63-126    Ratings of Perceived Exertion 11-13    Perceived Dyspnea 0-4      Progression   Progression Continue progressive overload as per policy without signs/symptoms or physical distress.      Resistance Training   Training Prescription Yes    Weight 3    Reps 10-15             Perform Capillary Blood Glucose checks as needed.  Exercise Prescription Changes:   Exercise Prescription Changes     Row Name 03/15/23 1500 03/29/23 1500           Response to Exercise   Blood Pressure (Admit) 126/82 122/74      Blood Pressure (Exercise) 162/84 164/80      Blood Pressure (Exit) 122/68 108/70      Heart Rate (Admit) 91 bpm 89 bpm      Heart Rate (Exercise) 129 bpm 141 bpm      Heart Rate (Exit) 102 bpm 103 bpm      Rating of Perceived Exertion (Exercise) 12 11      Perceived Dyspnea (Exercise) 0 --      Symptoms None None      Comments Pt first day in CRP2 orientation Reviewed METs      Duration Continue with 30 min of aerobic exercise without signs/symptoms of physical distress. Continue with 30 min of aerobic exercise without signs/symptoms of physical distress.      Intensity THRR unchanged THRR unchanged        Progression   Progression Continue to progress workloads to maintain intensity without signs/symptoms of physical distress. Continue to progress workloads to maintain intensity without signs/symptoms of physical distress.      Average METs 2.05 2.85         Resistance Training   Training Prescription Yes Yes      Weight 3 3      Reps 10-15 10-15      Time 10 Minutes 10 Minutes        Interval Training   Interval Training No No        Recumbant Bike   Level 1 --      RPM 66 --      Watts 17 --      Minutes  15 --      METs 2.1 --        NuStep   Level 1 1      SPM 81 99      Minutes 15 15      METs 2 2.5        Recumbant Elliptical   Level -- 1      RPM -- 66      Watts -- 78      Minutes -- 15      METs -- 3.2               Exercise Comments:   Exercise Comments     Row Name 03/15/23 1543 03/29/23 1516         Exercise Comments Pt first day in CRP2 program, tolerated exercise well with avg MET level of 2.05 with no s/sx. Pt tried treadmill, but did not tolerate well due tob balance concerns, moved to nustep and RB and did great. Pt is learning THRR, RPE, and ExRx. Overall pt off to good start. Reviewed METs with patient today, Pt is progresing well. No changes today.               Exercise Goals and Review:   Exercise Goals     Row Name 03/07/23 1021             Exercise Goals   Increase Physical Activity Yes       Intervention Provide advice, education, support and counseling about physical activity/exercise needs.;Develop an individualized exercise prescription for aerobic and resistive training based on initial evaluation findings, risk stratification, comorbidities and participant's personal goals.       Expected Outcomes Short Term: Attend rehab on a regular basis to increase amount of physical activity.;Long Term: Exercising regularly at least 3-5 days a week.;Long Term: Add in home exercise to make exercise part of routine and to increase amount of physical activity.       Increase Strength and Stamina Yes       Intervention Provide advice, education, support and counseling about physical activity/exercise needs.;Develop an individualized exercise prescription for aerobic and resistive  training based on initial evaluation findings, risk stratification, comorbidities and participant's personal goals.       Expected Outcomes Short Term: Increase workloads from initial exercise prescription for resistance, speed, and METs.;Short Term: Perform resistance training exercises routinely during rehab and add in resistance training at home;Long Term: Improve cardiorespiratory fitness, muscular endurance and strength as measured by increased METs and functional capacity ( )       Able to understand and use rate of perceived exertion (RPE) scale Yes       Intervention Provide education and explanation on how to use RPE scale       Expected Outcomes Short Term: Able to use RPE daily in rehab to express subjective intensity level;Long Term:  Able to use RPE to guide intensity level when exercising independently       Knowledge and understanding of Target Heart Rate Range (THRR) Yes       Intervention Provide education and explanation of THRR including how the numbers were predicted and where they are located for reference       Expected Outcomes Short Term: Able to state/look up THRR;Short Term: Able to use daily as guideline for intensity in rehab;Long Term: Able to use THRR to govern intensity when exercising independently       Understanding of Exercise Prescription Yes  Intervention Provide education, explanation, and written materials on patient's individual exercise prescription       Expected Outcomes Short Term: Able to explain program exercise prescription;Long Term: Able to explain home exercise prescription to exercise independently                Exercise Goals Re-Evaluation :  Exercise Goals Re-Evaluation     Row Name 03/15/23 1540             Exercise Goal Re-Evaluation   Exercise Goals Review Increase Physical Activity;Increase Strength and Stamina;Able to understand and use rate of perceived exertion (RPE) scale;Knowledge and understanding of Target Heart Rate  Range (THRR);Understanding of Exercise Prescription       Comments Pt first day in CRP2 program, tolerated exercise well with avg MET level of 2.05 with no s/sx. Pt tried treadmill, but did not tolerate well due tob balance concerns, moved to nustep and RB and did great. Pt is learning THRR, RPE, and ExRx. Overall pt off to good start.       Expected Outcomes Will continue to progress workloads as tolerated.                Discharge Exercise Prescription (Final Exercise Prescription Changes):  Exercise Prescription Changes - 03/29/23 1500       Response to Exercise   Blood Pressure (Admit) 122/74    Blood Pressure (Exercise) 164/80    Blood Pressure (Exit) 108/70    Heart Rate (Admit) 89 bpm    Heart Rate (Exercise) 141 bpm    Heart Rate (Exit) 103 bpm    Rating of Perceived Exertion (Exercise) 11    Symptoms None    Comments Reviewed METs    Duration Continue with 30 min of aerobic exercise without signs/symptoms of physical distress.    Intensity THRR unchanged      Progression   Progression Continue to progress workloads to maintain intensity without signs/symptoms of physical distress.    Average METs 2.85      Resistance Training   Training Prescription Yes    Weight 3    Reps 10-15    Time 10 Minutes      Interval Training   Interval Training No      NuStep   Level 1    SPM 99    Minutes 15    METs 2.5      Recumbant Elliptical   Level 1    RPM 66    Watts 78    Minutes 15    METs 3.2             Nutrition:  Target Goals: Understanding of nutrition guidelines, daily intake of sodium 1500mg , cholesterol 200mg , calories 30% from fat and 7% or less from saturated fats, daily to have 5 or more servings of fruits and vegetables.  Biometrics:  Pre Biometrics - 03/07/23 1054       Pre Biometrics   Waist Circumference 45.5 inches    Hip Circumference 44 inches    Waist to Hip Ratio 1.03 %    Triceps Skinfold 40 mm    % Body Fat 48 %    Grip  Strength 35 kg    Flexibility 0 in   could not reach   Single Leg Stand 6.18 seconds              Nutrition Therapy Plan and Nutrition Goals:  Nutrition Therapy & Goals - 03/15/23 1323       Nutrition Therapy  Diet Heart Healthy/Carbohydrate Consistent Diet    Drug/Food Interactions Statins/Certain Fruits      Personal Nutrition Goals   Nutrition Goal Patient to identify strategies for reducing cardiovascular risk by attending the [employer] education and nutrition series weekly.    Personal Goal #2 Patient to improve diet quality by using the plate method as a guide for meal planning to include lean protein/plant protein, fruits, vegetables, whole grains, nonfat dairy as part of a well-balanced diet.    Personal Goal #3 Patient to identify strategies for improving blood sugar control with goal A1c <7%    Personal Goal #4 Patient to reduce sodium intake 1500mg  per day    Comments Patient with medical history NSTEMI, CAD, DM2, HTN, CABGx3, Diverticulitis. Her A1c and LDL remain above goal. Patient will benefit from participation in intensive cardiac rehab for nutrition, exercise, and lifestyle modification.      Intervention Plan   Intervention Prescribe, educate and counsel regarding individualized specific dietary modifications aiming towards targeted core components such as weight, hypertension, lipid management, diabetes, heart failure and other comorbidities.;Nutrition handout(s) given to patient.    Expected Outcomes Short Term Goal: Understand basic principles of dietary content, such as calories, fat, sodium, cholesterol and nutrients.;Long Term Goal: Adherence to prescribed nutrition plan.             Nutrition Assessments:  MEDIFICTS Score Key: >=70 Need to make dietary changes  40-70 Heart Healthy Diet <= 40 Therapeutic Level Cholesterol Diet    Picture Your Plate Scores: <25 Unhealthy dietary pattern with much room for improvement. 41-50 Dietary pattern  unlikely to meet recommendations for good health and room for improvement. 51-60 More healthful dietary pattern, with some room for improvement.  >60 Healthy dietary pattern, although there may be some specific behaviors that could be improved.    Nutrition Goals Re-Evaluation:  Nutrition Goals Re-Evaluation     Row Name 03/15/23 1323             Goals   Current Weight 202 lb 9.6 oz (91.9 kg)       Comment cholesterol 261, triglycerides 594 (fenofibrate), HDL 38, LDL 136 (lipitor), lipoproteinA 227.2, A1c 8.6 (glipizide, metformin)       Expected Outcome Patient with medical history NSTEMI, CAD, DM2, HTN, CABGx3, Diverticulitis. Her A1c and LDL remain above goal. Patient will benefit from participation in intensive cardiac rehab for nutrition, exercise, and lifestyle modification.                Nutrition Goals Re-Evaluation:  Nutrition Goals Re-Evaluation     Row Name 03/15/23 1323             Goals   Current Weight 202 lb 9.6 oz (91.9 kg)       Comment cholesterol 261, triglycerides 594 (fenofibrate), HDL 38, LDL 136 (lipitor), lipoproteinA 227.2, A1c 8.6 (glipizide, metformin)       Expected Outcome Patient with medical history NSTEMI, CAD, DM2, HTN, CABGx3, Diverticulitis. Her A1c and LDL remain above goal. Patient will benefit from participation in intensive cardiac rehab for nutrition, exercise, and lifestyle modification.                Nutrition Goals Discharge (Final Nutrition Goals Re-Evaluation):  Nutrition Goals Re-Evaluation - 03/15/23 1323       Goals   Current Weight 202 lb 9.6 oz (91.9 kg)    Comment cholesterol 261, triglycerides 594 (fenofibrate), HDL 38, LDL 136 (lipitor), lipoproteinA 227.2, A1c 8.6 (glipizide, metformin)    Expected  Outcome Patient with medical history NSTEMI, CAD, DM2, HTN, CABGx3, Diverticulitis. Her A1c and LDL remain above goal. Patient will benefit from participation in intensive cardiac rehab for nutrition, exercise, and  lifestyle modification.             Psychosocial: Target Goals: Acknowledge presence or absence of significant depression and/or stress, maximize coping skills, provide positive support system. Participant is able to verbalize types and ability to use techniques and skills needed for reducing stress and depression.  Initial Review & Psychosocial Screening:  Initial Psych Review & Screening - 03/07/23 1041       Initial Review   Current issues with Current Depression;History of Depression;Current Stress Concerns    Source of Stress Concerns Financial      Family Dynamics   Good Support System? Yes   Friends and neighbors   Comments [name] shared that she has some low levels of depression since her surgery because she wasn't able to drive or leave her house. Now that she can drive and be more independent she feels somewhat better, and feels her medication is working. [name] has some stress regarding her medical bills. Denies any need for further assistance.      Barriers   Psychosocial barriers to participate in program The patient should benefit from training in stress management and relaxation.      Screening Interventions   Interventions To provide support and resources with identified psychosocial needs;Provide feedback about the scores to participant;Encouraged to exercise    Expected Outcomes Long Term Goal: Stressors or current issues are controlled or eliminated.;Short Term goal: Identification and review with participant of any Quality of Life or Depression concerns found by scoring the questionnaire.;Long Term goal: The participant improves quality of Life and PHQ9 Scores as seen by post scores and/or verbalization of changes             Quality of Life Scores:  Quality of Life - 03/07/23 1121       Quality of Life   Select Quality of Life      Quality of Life Scores   Health/Function Pre 22.13 %    Socioeconomic Pre 17.14 %    Psych/Spiritual Pre 24.43 %    Family  Pre 12.5 %    GLOBAL Pre 20.64 %            Scores of 19 and below usually indicate a poorer quality of life in these areas.  A difference of  2-3 points is a clinically meaningful difference.  A difference of 2-3 points in the total score of the Quality of Life Index has been associated with significant improvement in overall quality of life, self-image, physical symptoms, and general health in studies assessing change in quality of life.  PHQ-9: Review Flowsheet  More data exists      03/07/2023 11/26/2022 08/21/2022 08/07/2022 07/23/2022  Depression screen PHQ 2/9  Decreased Interest 1 0 1 1 1   Down, Depressed, Hopeless 0 0 1 0 0  PHQ - 2 Score 1 0 2 1 1   Altered sleeping 1 0 1 1 1   Tired, decreased energy 0 0 1 1 1   Change in appetite 0 0 1 0 0  Feeling bad or failure about yourself  0 0 1 0 0  Trouble concentrating 0 0 0 0 0  Moving slowly or fidgety/restless 0 0 0 0 0  Suicidal thoughts 0 0 0 0 0  PHQ-9 Score 2 0 6 3 3   Difficult doing work/chores Not  difficult at all Not difficult at all Very difficult Very difficult Somewhat difficult    Details           Interpretation of Total Score  Total Score Depression Severity:  1-4 = Minimal depression, 5-9 = Mild depression, 10-14 = Moderate depression, 15-19 = Moderately severe depression, 20-27 = Severe depression   Psychosocial Evaluation and Intervention:   Psychosocial Re-Evaluation:  Psychosocial Re-Evaluation     Row Name 03/15/23 1415 04/02/23 1358           Psychosocial Re-Evaluation   Current issues with History of Depression;Current Stress Concerns;Current Depression History of Depression;Current Stress Concerns;Current Depression      Comments [name] did not voice any increased concerns or stressors on her first day of exercise. Will review quality of life questionnaire in the upcoming week [name] denies being depressed but admits that she has been through alot  since having open heart surgey in July loosing her  husband and having to downsize from a house to an apartment. [name] is unable to work at [employer] currently as she is reovering from open heart surgery. [name] could benefit from counselling. I asked [name] to discuss with her PCP when she sees her later this week      Expected Outcomes [name] will have decreased or controlled depression upon completion of cardiac rehab. [name] will have decreased or controlled depression upon completion of cardiac rehab.      Interventions Encouraged to attend Cardiac Rehabilitation for the exercise;Relaxation education;Stress management education Encouraged to attend Cardiac Rehabilitation for the exercise;Relaxation education;Stress management education      Continue Psychosocial Services  Follow up required by staff Follow up required by staff        Initial Review   Source of Stress Concerns Financial Financial      Comments Will continue to monitor and offer support as needed. Will continue to monitor and offer support as needed.               Psychosocial Discharge (Final Psychosocial Re-Evaluation):  Psychosocial Re-Evaluation - 04/02/23 1358       Psychosocial Re-Evaluation   Current issues with History of Depression;Current Stress Concerns;Current Depression    Comments [name] denies being depressed but admits that she has been through alot  since having open heart surgey in July loosing her husband and having to downsize from a house to an apartment. [name] is unable to work at [employer] currently as she is reovering from open heart surgery. [name] could benefit from counselling. I asked [name] to discuss with her PCP when she sees her later this week    Expected Outcomes [name] will have decreased or controlled depression upon completion of cardiac rehab.    Interventions Encouraged to attend Cardiac Rehabilitation for the exercise;Relaxation education;Stress management education    Continue Psychosocial Services  Follow up required by staff      Initial Review   Source of Stress  Concerns Financial    Comments Will continue to monitor and offer support as needed.             Vocational Rehabilitation: Provide vocational rehab assistance to qualifying candidates.   Vocational Rehab Evaluation & Intervention:  Vocational Rehab - 03/07/23 1022       Initial Vocational Rehab Evaluation & Intervention   Assessment shows need for Vocational Rehabilitation No   [name] is on leave from work but planning to return            Education: Education Goals:  Education classes will be provided on a weekly basis, covering required topics. Participant will state understanding/return demonstration of topics presented.    Education     Row Name 03/15/23 1500     Education   Cardiac Education Topics [employer]   Writer General Education   General Education Hypertension and Heart Disease   Instruction Review Code 1- Verbalizes Understanding   Class Start Time 1400   Class Stop Time 1440   Class Time Calculation (min) 40 min    Row Name 03/18/23 1500     Education   Cardiac Education Topics [employer]   Licensed conveyancer Nutrition   Nutrition Dining Out - Part 1   Instruction Review Code 1- Verbalizes Understanding   Class Start Time 1400   Class Stop Time 1445   Class Time Calculation (min) 45 min    Row Name 03/29/23 1400     Education   Cardiac Education Topics Pritikin   Licensed conveyancer Nutrition   Nutrition Vitamins and Minerals   Instruction Review Code 1- Verbalizes Understanding   Class Start Time 1145   Class Stop Time 1226   Class Time Calculation (min) 41 min    Row Name 04/01/23 1300     Education   Cardiac Education Topics Pritikin   Glass blower/designer Nutrition   Nutrition Workshop Fueling a Forensic psychologist   Instruction Review Code 1-  Teaching laboratory technician Start Time 1145   Class Stop Time 1230   Class Time Calculation (min) 45 min            Core Videos: Exercise    Move It!  Clinical staff conducted group or individual video education with verbal and written material and guidebook.  Patient learns the recommended Pritikin exercise program. Exercise with the goal of living a long, healthy life. Some of the health benefits of exercise include controlled diabetes, healthier blood pressure levels, improved cholesterol levels, improved heart and lung capacity, improved sleep, and better body composition. Everyone should speak with their doctor before starting or changing an exercise routine.  Biomechanical Limitations Clinical staff conducted group or individual video education with verbal and written material and guidebook.  Patient learns how biomechanical limitations can impact exercise and how we can mitigate and possibly overcome limitations to have an impactful and balanced exercise routine.  Body Composition Clinical staff conducted group or individual video education with verbal and written material and guidebook.  Patient learns that body composition (ratio of muscle mass to fat mass) is a key component to assessing overall fitness, rather than body weight alone. Increased fat mass, especially visceral belly fat, can put [place] at increased risk for metabolic syndrome, type 2 diabetes, heart disease, and even death. It is recommended to combine diet and exercise (cardiovascular and resistance training) to improve your body composition. Seek guidance from your physician and exercise physiologist before implementing an exercise routine.  Exercise Action Plan Clinical staff conducted group or individual video education with verbal and written material and guidebook.  Patient learns the recommended strategies to achieve and enjoy long-term exercise adherence, including variety, self-motivation, self-efficacy,  and positive decision making. Benefits of exercise include fitness, good health, weight  management, more energy, better sleep, less stress, and overall well-being.  Medical   Heart Disease Risk Reduction Clinical staff conducted group or individual video education with verbal and written material and guidebook.  Patient learns our heart is our most vital organ as it circulates oxygen, nutrients, white blood cells, and hormones throughout the entire body, and carries waste away. Data supports a plant-based eating plan like the Pritikin Program for its effectiveness in slowing progression of and reversing heart disease. The video provides a number of recommendations to address heart disease.   Metabolic Syndrome and Belly Fat  Clinical staff conducted group or individual video education with verbal and written material and guidebook.  Patient learns what metabolic syndrome is, how it leads to heart disease, and how one can reverse it and keep it from coming back. You have metabolic syndrome if you have 3 of the following 5 criteria: abdominal obesity, high blood pressure, high triglycerides, low HDL cholesterol, and high blood sugar.  Hypertension and Heart Disease Clinical staff conducted group or individual video education with verbal and written material and guidebook.  Patient learns that high blood pressure, or hypertension, is very common in the [place]. Hypertension is largely due to excessive salt intake, but other important risk factors include being overweight, physical inactivity, drinking too much alcohol, smoking, and not eating enough potassium from fruits and vegetables. High blood pressure is a leading risk factor for heart attack, stroke, congestive heart failure, dementia, kidney failure, and premature death. Long-term effects of excessive salt intake include stiffening of the arteries and thickening of heart muscle and organ damage. Recommendations include ways to reduce  hypertension and the risk of heart disease.  Diseases of Our Time - Focusing on Diabetes Clinical staff conducted group or individual video education with verbal and written material and guidebook.  Patient learns why the best way to stop diseases of our time is prevention, through food and other lifestyle changes. Medicine (such as prescription pills and surgeries) is often only a Band-Aid on the problem, not a long-term solution. Most common diseases of our time include obesity, type 2 diabetes, hypertension, heart disease, and cancer. The Pritikin Program is recommended and has been proven to help reduce, reverse, and/or prevent the damaging effects of metabolic syndrome.  Nutrition   Overview of the Pritikin Eating Plan  Clinical staff conducted group or individual video education with verbal and written material and guidebook.  Patient learns about the Pritikin Eating Plan for disease risk reduction. The Pritikin Eating Plan emphasizes a wide variety of unrefined, minimally-processed carbohydrates, like fruits, vegetables, whole grains, and legumes. Go, Caution, and Stop food choices are explained. Plant-based and lean animal proteins are emphasized. Rationale provided for low sodium intake for blood pressure control, low added sugars for blood sugar stabilization, and low added fats and oils for coronary artery disease risk reduction and weight management.  Calorie Density  Clinical staff conducted group or individual video education with verbal and written material and guidebook.  Patient learns about calorie density and how it impacts the Pritikin Eating Plan. Knowing the characteristics of the food you choose will help you decide whether those foods will lead to weight gain or weight loss, and whether you want to consume more or less of them. Weight loss is usually a side effect of the Pritikin Eating Plan because of its focus on low calorie-dense foods.  Label Reading  Clinical staff  conducted group or individual video education with verbal and written material and  guidebook.  Patient learns about the Pritikin recommended label reading guidelines and corresponding recommendations regarding calorie density, added sugars, sodium content, and whole grains.  Dining Out - Part 1  Clinical staff conducted group or individual video education with verbal and written material and guidebook.  Patient learns that restaurant meals can be sabotaging because they can be so high in calories, fat, sodium, and/or sugar. Patient learns recommended strategies on how to positively address this and avoid unhealthy pitfalls.  Facts on Fats  Clinical staff conducted group or individual video education with verbal and written material and guidebook.  Patient learns that lifestyle modifications can be just as effective, if not more so, as many medications for lowering your risk of heart disease. A Pritikin lifestyle can help to reduce your risk of inflammation and atherosclerosis (cholesterol build-up, or plaque, in the artery walls). Lifestyle interventions such as dietary choices and physical activity address the cause of atherosclerosis. A review of the types of fats and their impact on blood cholesterol levels, along with dietary recommendations to reduce fat intake is also included.  Nutrition Action Plan  Clinical staff conducted group or individual video education with verbal and written material and guidebook.  Patient learns how to incorporate Pritikin recommendations into their lifestyle. Recommendations include planning and keeping personal health goals in mind as an important part of their success.  Healthy Mind-Set    Healthy Minds, Bodies, Hearts  Clinical staff conducted group or individual video education with verbal and written material and guidebook.  Patient learns how to identify when they are stressed. Video will discuss the impact of that stress, as well as the many benefits of  stress management. Patient will also be introduced to stress management techniques. The way we think, act, and feel has an impact on our hearts.  How Our Thoughts Can Heal Our Hearts  Clinical staff conducted group or individual video education with verbal and written material and guidebook.  Patient learns that negative thoughts can cause depression and anxiety. This can result in negative lifestyle behavior and serious health problems. Cognitive behavioral therapy is an effective method to help control our thoughts in order to change and improve our emotional outlook.  Additional Videos:  Exercise    Improving Performance  Clinical staff conducted group or individual video education with verbal and written material and guidebook.  Patient learns to use a non-linear approach by alternating intensity levels and lengths of time spent exercising to help burn more calories and lose more body fat. Cardiovascular exercise helps improve heart health, metabolism, hormonal balance, blood sugar control, and recovery from fatigue. Resistance training improves strength, endurance, balance, coordination, reaction time, metabolism, and muscle mass. Flexibility exercise improves circulation, posture, and balance. Seek guidance from your physician and exercise physiologist before implementing an exercise routine and learn your capabilities and proper form for all exercise.  Introduction to Yoga  Clinical staff conducted group or individual video education with verbal and written material and guidebook.  Patient learns about yoga, a discipline of the coming together of mind, breath, and body. The benefits of yoga include improved flexibility, improved range of motion, better posture and core strength, increased lung function, weight loss, and positive self-image. Yoga's heart health benefits include lowered blood pressure, healthier heart rate, decreased cholesterol and triglyceride levels, improved immune function,  and reduced stress. Seek guidance from your physician and exercise physiologist before implementing an exercise routine and learn your capabilities and proper form for all exercise.  Medical  Aging: Manufacturing systems engineer of Life  Clinical staff conducted group or individual video education with verbal and written material and guidebook.  Patient learns key strategies and recommendations to stay in good physical health and enhance quality of life, such as prevention strategies, having an advocate, securing a Health Care Proxy and Power of Attorney, and keeping a list of medications and system for tracking them. It also discusses how to avoid risk for bone loss.  Biology of Weight Control  Clinical staff conducted group or individual video education with verbal and written material and guidebook.  Patient learns that weight gain occurs because we consume more calories than we burn (eating more, moving less). Even if your body weight is normal, you may have higher ratios of fat compared to muscle mass. Too much body fat puts you at increased risk for cardiovascular disease, heart attack, stroke, type 2 diabetes, and obesity-related cancers. In addition to exercise, following the Pritikin Eating Plan can help reduce your risk.  Decoding Lab Results  Clinical staff conducted group or individual video education with verbal and written material and guidebook.  Patient learns that lab test reflects one measurement whose values change over time and are influenced by many factors, including medication, stress, sleep, exercise, food, hydration, pre-existing medical conditions, and more. It is recommended to use the knowledge from this video to become more involved with your lab results and evaluate your numbers to speak with your doctor.   Diseases of Our Time - Overview  Clinical staff conducted group or individual video education with verbal and written material and guidebook.  Patient learns that  according to the CDC, 50% to 70% of chronic diseases (such as obesity, type 2 diabetes, elevated lipids, hypertension, and heart disease) are avoidable through lifestyle improvements including healthier food choices, listening to satiety cues, and increased physical activity.  Sleep Disorders Clinical staff conducted group or individual video education with verbal and written material and guidebook.  Patient learns how good quality and duration of sleep are important to overall health and well-being. Patient also learns about sleep disorders and how they impact health along with recommendations to address them, including discussing with a physician.  Nutrition  Dining Out - Part 2 Clinical staff conducted group or individual video education with verbal and written material and guidebook.  Patient learns how to plan ahead and communicate in order to maximize their dining experience in a healthy and nutritious manner. Included are recommended food choices based on the type of restaurant the patient is visiting.   Fueling a Banker conducted group or individual video education with verbal and written material and guidebook.  There is a strong connection between our food choices and our health. Diseases like obesity and type 2 diabetes are very prevalent and are in large-part due to lifestyle choices. The Pritikin Eating Plan provides plenty of food and hunger-curbing satisfaction. It is easy to follow, affordable, and helps reduce health risks.  Menu Workshop  Clinical staff conducted group or individual video education with verbal and written material and guidebook.  Patient learns that restaurant meals can sabotage health goals because they are often packed with calories, fat, sodium, and sugar. Recommendations include strategies to plan ahead and to communicate with the manager, chef, or server to help order a healthier meal.  Planning Your Eating Strategy  Clinical staff  conducted group or individual video education with verbal and written material and guidebook.  Patient learns about the Pritikin Eating Plan  and its benefit of reducing the risk of disease. The Pritikin Eating Plan does not focus on calories. Instead, it emphasizes high-quality, nutrient-rich foods. By knowing the characteristics of the foods, we choose, we can determine their calorie density and make informed decisions.  Targeting Your Nutrition Priorities  Clinical staff conducted group or individual video education with verbal and written material and guidebook.  Patient learns that lifestyle habits have a tremendous impact on disease risk and progression. This video provides eating and physical activity recommendations based on your personal health goals, such as reducing LDL cholesterol, losing weight, preventing or controlling type 2 diabetes, and reducing high blood pressure.  Vitamins and Minerals  Clinical staff conducted group or individual video education with verbal and written material and guidebook.  Patient learns different ways to obtain key vitamins and minerals, including through a recommended healthy diet. It is important to discuss all supplements you take with your doctor.   Healthy Mind-Set    Smoking Cessation  Clinical staff conducted group or individual video education with verbal and written material and guidebook.  Patient learns that cigarette smoking and tobacco addiction pose a serious health risk which affects millions of people. Stopping smoking will significantly reduce the risk of heart disease, lung disease, and many forms of cancer. Recommended strategies for quitting are covered, including working with your doctor to develop a successful plan.  Culinary   Becoming a Set designer conducted group or individual video education with verbal and written material and guidebook.  Patient learns that cooking at home can be healthy, cost-effective,  quick, and puts them in control. Keys to cooking healthy recipes will include looking at your recipe, assessing your equipment needs, planning ahead, making it simple, choosing cost-effective seasonal ingredients, and limiting the use of added fats, salts, and sugars.  Cooking - Breakfast and Snacks  Clinical staff conducted group or individual video education with verbal and written material and guidebook.  Patient learns how important breakfast is to satiety and nutrition through the entire day. Recommendations include key foods to eat during breakfast to help stabilize blood sugar levels and to prevent overeating at meals later in the day. Planning ahead is also a key component.  Cooking - Educational psychologist conducted group or individual video education with verbal and written material and guidebook.  Patient learns eating strategies to improve overall health, including an approach to cook more at home. Recommendations include thinking of animal protein as a side on your plate rather than center stage and focusing instead on lower calorie dense options like vegetables, fruits, whole grains, and plant-based proteins, such as beans. Making sauces in large quantities to freeze for later and leaving the skin on your vegetables are also recommended to maximize your experience.  Cooking - Healthy Salads and Dressing Clinical staff conducted group or individual video education with verbal and written material and guidebook.  Patient learns that vegetables, fruits, whole grains, and legumes are the foundations of the Pritikin Eating Plan. Recommendations include how to incorporate each of these in flavorful and healthy salads, and how to create homemade salad dressings. Proper handling of ingredients is also covered. Cooking - Soups and [employer] - Soups and Desserts Clinical staff conducted group or individual video education with verbal and written material and guidebook.  Patient  learns that Pritikin soups and desserts make for easy, nutritious, and delicious snacks and meal components that are low in sodium, fat, sugar, and calorie density,  while high in vitamins, minerals, and filling fiber. Recommendations include simple and healthy ideas for soups and desserts.   Overview     The Pritikin Solution Program Overview Clinical staff conducted group or individual video education with verbal and written material and guidebook.  Patient learns that the results of the Pritikin Program have been documented in more than 100 articles published in peer-reviewed journals, and the benefits include reducing risk factors for (and, in some cases, even reversing) high cholesterol, high blood pressure, type 2 diabetes, obesity, and more! An overview of the three key pillars of the Pritikin Program will be covered: eating well, doing regular exercise, and having a healthy mind-set.  WORKSHOPS  Exercise: Exercise Basics: Building Your Action Plan Clinical staff led group instruction and group discussion with PowerPoint presentation and patient guidebook. To enhance the learning environment the use of posters, models and videos may be added. At the conclusion of this workshop, patients will comprehend the difference between physical activity and exercise, as well as the benefits of incorporating both, into their routine. Patients will understand the FITT (Frequency, Intensity, Time, and Type) principle and how to use it to build an exercise action plan. In addition, safety concerns and other considerations for exercise and cardiac rehab will be addressed by the presenter. The purpose of this lesson is to promote a comprehensive and effective weekly exercise routine in order to improve patients' overall level of fitness.   Managing Heart Disease: Your Path to a Healthier Heart Clinical staff led group instruction and group discussion with PowerPoint presentation and patient guidebook. To  enhance the learning environment the use of posters, models and videos may be added.At the conclusion of this workshop, patients will understand the anatomy and physiology of the heart. Additionally, they will understand how Pritikin's three pillars impact the risk factors, the progression, and the management of heart disease.  The purpose of this lesson is to provide a high-level overview of the heart, heart disease, and how the Pritikin lifestyle positively impacts risk factors.  Exercise Biomechanics Clinical staff led group instruction and group discussion with PowerPoint presentation and patient guidebook. To enhance the learning environment the use of posters, models and videos may be added. Patients will learn how the structural parts of their bodies function and how these functions impact their daily activities, movement, and exercise. Patients will learn how to promote a neutral spine, learn how to manage pain, and identify ways to improve their physical movement in order to promote healthy living. The purpose of this lesson is to expose patients to common physical limitations that impact physical activity. Participants will learn practical ways to adapt and manage aches and pains, and to minimize their effect on regular exercise. Patients will learn how to maintain good posture while sitting, walking, and lifting.  Balance Training and Fall Prevention  Clinical staff led group instruction and group discussion with PowerPoint presentation and patient guidebook. To enhance the learning environment the use of posters, models and videos may be added. At the conclusion of this workshop, patients will understand the importance of their sensorimotor skills (vision, proprioception, and the vestibular system) in maintaining their ability to balance as they age. Patients will apply a variety of balancing exercises that are appropriate for their current level of function. Patients will understand  the common causes for poor balance, possible solutions to these problems, and ways to modify their physical environment in order to minimize their fall risk. The purpose of this lesson is to teach  patients about the importance of maintaining balance as they age and ways to minimize their risk of falling.  WORKSHOPS   Nutrition:  Fueling a Ship broker led group instruction and group discussion with PowerPoint presentation and patient guidebook. To enhance the learning environment the use of posters, models and videos may be added. Patients will review the foundational principles of the Pritikin Eating Plan and understand what constitutes a serving size in each of the food groups. Patients will also learn Pritikin-friendly foods that are better choices when away from home and review make-ahead meal and snack options. Calorie density will be reviewed and applied to three nutrition priorities: weight maintenance, weight loss, and weight gain. The purpose of this lesson is to reinforce (in a group setting) the key concepts around what patients are recommended to eat and how to apply these guidelines when away from home by planning and selecting Pritikin-friendly options. Patients will understand how calorie density may be adjusted for different weight management goals.  Mindful Eating  Clinical staff led group instruction and group discussion with PowerPoint presentation and patient guidebook. To enhance the learning environment the use of posters, models and videos may be added. Patients will briefly review the concepts of the Pritikin Eating Plan and the importance of low-calorie dense foods. The concept of mindful eating will be introduced as well as the importance of paying attention to internal hunger signals. Triggers for non-hunger eating and techniques for dealing with triggers will be explored. The purpose of this lesson is to provide patients with the opportunity to review the basic  principles of the Pritikin Eating Plan, discuss the value of eating mindfully and how to measure internal cues of hunger and fullness using the Hunger Scale. Patients will also discuss reasons for non-hunger eating and learn strategies to use for controlling emotional eating.  Targeting Your Nutrition Priorities Clinical staff led group instruction and group discussion with PowerPoint presentation and patient guidebook. To enhance the learning environment the use of posters, models and videos may be added. Patients will learn how to determine their genetic susceptibility to disease by reviewing their family history. Patients will gain insight into the importance of diet as part of an overall healthy lifestyle in mitigating the impact of genetics and other environmental insults. The purpose of this lesson is to provide patients with the opportunity to assess their personal nutrition priorities by looking at their family history, their own health history and current risk factors. Patients will also be able to discuss ways of prioritizing and modifying the Pritikin Eating Plan for their highest risk areas  Menu  Clinical staff led group instruction and group discussion with PowerPoint presentation and patient guidebook. To enhance the learning environment the use of posters, models and videos may be added. Using menus brought in from [employer], or printed from [employer], patients will apply the Pritikin dining out guidelines that were presented in the [employer] video. Patients will also be able to practice these guidelines in a variety of provided scenarios. The purpose of this lesson is to provide patients with the opportunity to practice hands-on learning of the Pritikin Dining Out guidelines with actual menus and practice scenarios.  Label Reading Clinical staff led group instruction and group discussion with PowerPoint presentation and patient guidebook. To enhance the  learning environment the use of posters, models and videos may be added. Patients will review and discuss the Pritikin label reading guidelines presented in [employer]'s Label Reading Educational series  video. Using fool labels brought in from local grocery stores and markets, patients will apply the label reading guidelines and determine if the packaged food meet the Pritikin guidelines. The purpose of this lesson is to provide patients with the opportunity to review, discuss, and practice hands-on learning of the Pritikin Label Reading guidelines with actual packaged food labels. Cooking School  [employer]'s [employer] are designed to teach patients ways to prepare quick, simple, and affordable recipes at home. The importance of nutrition's role in chronic disease risk reduction is reflected in its emphasis in the overall Pritikin program. By learning how to prepare essential core Pritikin Eating Plan recipes, patients will increase control over what they eat; be able to customize the flavor of foods without the use of added salt, sugar, or fat; and improve the quality of the food they consume. By learning a set of core recipes which are easily assembled, quickly prepared, and affordable, patients are more likely to prepare more healthy foods at home. These workshops focus on convenient breakfasts, simple entres, side dishes, and desserts which can be prepared with minimal effort and are consistent with nutrition recommendations for cardiovascular risk reduction. Cooking [employer] are taught by a Armed forces logistics/support/administrative officer (RD) who has been trained by the [employer]. The chef or RD has a clear understanding of the importance of minimizing - if not completely eliminating - added fat, sugar, and sodium in recipes. Throughout the series of Cooking School Workshop sessions, patients will learn about healthy ingredients and efficient methods of cooking to build confidence in their  capability to prepare    Cooking School weekly topics:  Adding Flavor- Sodium-Free  Fast and Healthy Breakfasts  Powerhouse Plant-Based Proteins  Satisfying Salads and Dressings  Simple Sides and Sauces  International Cuisine-Spotlight on the [employer] Zones  Delicious Desserts  Savory Soups  [employer] - Meals in a Astronomer Appetizers and Snacks  Comforting Weekend Breakfasts  One-Pot Wonders   Fast Evening Meals  Landscape architect Your Pritikin Plate  WORKSHOPS   Healthy Mindset (Psychosocial):  Focused Goals, Sustainable Changes Clinical staff led group instruction and group discussion with PowerPoint presentation and patient guidebook. To enhance the learning environment the use of posters, models and videos may be added. Patients will be able to apply effective goal setting strategies to establish at least one personal goal, and then take consistent, meaningful action toward that goal. They will learn to identify common barriers to achieving personal goals and develop strategies to overcome them. Patients will also gain an understanding of how our mind-set can impact our ability to achieve goals and the importance of cultivating a positive and growth-oriented mind-set. The purpose of this lesson is to provide patients with a deeper understanding of how to set and achieve personal goals, as well as the tools and strategies needed to overcome common obstacles which may arise along the way.  From Head to Heart: The Power of a Healthy Outlook  Clinical staff led group instruction and group discussion with PowerPoint presentation and patient guidebook. To enhance the learning environment the use of posters, models and videos may be added. Patients will be able to recognize and describe the impact of emotions and mood on physical health. They will discover the importance of self-care and explore self-care practices which may work for them. Patients will also learn how  to utilize the 4 C's to cultivate a healthier outlook and better manage stress and challenges. The  purpose of this lesson is to demonstrate to patients how a healthy outlook is an essential part of maintaining good health, especially as they continue their cardiac rehab journey.  Healthy Sleep for a Healthy Heart Clinical staff led group instruction and group discussion with PowerPoint presentation and patient guidebook. To enhance the learning environment the use of posters, models and videos may be added. At the conclusion of this workshop, patients will be able to demonstrate knowledge of the importance of sleep to overall health, well-being, and quality of life. They will understand the symptoms of, and treatments for, common sleep disorders. Patients will also be able to identify daytime and nighttime behaviors which impact sleep, and they will be able to apply these tools to help manage sleep-related challenges. The purpose of this lesson is to provide patients with a general overview of sleep and outline the importance of quality sleep. Patients will learn about a few of the most common sleep disorders. Patients will also be introduced to the concept of "sleep hygiene," and discover ways to self-manage certain sleeping problems through simple daily behavior changes. Finally, the workshop will motivate patients by clarifying the links between quality sleep and their goals of heart-healthy living.   Recognizing and Reducing Stress Clinical staff led group instruction and group discussion with PowerPoint presentation and patient guidebook. To enhance the learning environment the use of posters, models and videos may be added. At the conclusion of this workshop, patients will be able to understand the types of stress reactions, differentiate between acute and chronic stress, and recognize the impact that chronic stress has on their health. They will also be able to apply different coping mechanisms, such as  reframing negative self-talk. Patients will have the opportunity to practice a variety of stress management techniques, such as deep abdominal breathing, progressive muscle relaxation, and/or guided imagery.  The purpose of this lesson is to educate patients on the role of stress in their lives and to provide healthy techniques for coping with it.  Learning Barriers/Preferences:  Learning Barriers/Preferences - 03/07/23 1043       Learning Barriers/Preferences   Learning Barriers Sight   wears glasses   Learning Preferences Audio;Computer/Internet;Group Instruction;Individual Instruction;Pictoral;Skilled Demonstration;Verbal Instruction;Video             Education Topics:  Knowledge Questionnaire Score:  Knowledge Questionnaire Score - 03/07/23 1044       Knowledge Questionnaire Score   Pre Score 20/24             Core Components/Risk Factors/Patient Goals at Admission:  Personal Goals and Risk Factors at Admission - 03/07/23 1022       Core Components/Risk Factors/Patient Goals on Admission    Weight Management Yes;Obesity;Weight Loss    Intervention Weight Management: Develop a combined nutrition and exercise program designed to reach desired caloric intake, while maintaining appropriate intake of nutrient and fiber, sodium and fats, and appropriate energy expenditure required for the weight goal.;Weight Management: Provide education and appropriate resources to help participant work on and attain dietary goals.;Weight Management/Obesity: Establish reasonable short term and long term weight goals.;Obesity: Provide education and appropriate resources to help participant work on and attain dietary goals.    Expected Outcomes Short Term: Continue to assess and modify interventions until short term weight is achieved;Long Term: Adherence to nutrition and physical activity/exercise program aimed toward attainment of established weight goal;Weight Loss: Understanding of general  recommendations for a balanced deficit meal plan, which promotes 1-2 lb weight loss per week and includes  a negative energy balance of [phone] kcal/d;Understanding recommendations for meals to include 15-35% energy as protein, 25-35% energy from fat, 35-60% energy from carbohydrates, less than 200mg  of dietary cholesterol, 20-35 gm of total fiber daily;Understanding of distribution of calorie intake throughout the day with the consumption of 4-5 meals/snacks    Diabetes Yes    Intervention Provide education about signs/symptoms and action to take for hypo/hyperglycemia.;Provide education about proper nutrition, including hydration, and aerobic/resistive exercise prescription along with prescribed medications to achieve blood glucose in normal ranges: Fasting glucose 65-99 mg/dL    Expected Outcomes Short Term: Participant verbalizes understanding of the signs/symptoms and immediate care of hyper/hypoglycemia, proper foot care and importance of medication, aerobic/resistive exercise and nutrition plan for blood glucose control.;Long Term: Attainment of HbA1C < 7%.    Hypertension Yes    Intervention Provide education on lifestyle modifcations including regular physical activity/exercise, weight management, moderate sodium restriction and increased consumption of fresh fruit, vegetables, and low fat dairy, alcohol moderation, and smoking cessation.;Monitor prescription use compliance.    Expected Outcomes Short Term: Continued assessment and intervention until BP is < 140/62mm HG in hypertensive participants. < 130/6mm HG in hypertensive participants with diabetes, heart failure or chronic kidney disease.;Long Term: Maintenance of blood pressure at goal levels.    Lipids Yes    Intervention Provide education and support for participant on nutrition & aerobic/resistive exercise along with prescribed medications to achieve LDL 70mg , HDL >40mg .    Expected Outcomes Short Term: Participant states understanding  of desired cholesterol values and is compliant with medications prescribed. Participant is following exercise prescription and nutrition guidelines.;Long Term: Cholesterol controlled with medications as prescribed, with individualized exercise RX and with personalized nutrition plan. Value goals: LDL < 70mg , HDL > 40 mg.    Stress Yes    Intervention Refer participants experiencing significant psychosocial distress to appropriate mental health specialists for further evaluation and treatment. When possible, include family members and significant others in education/counseling sessions.;Offer individual and/or small group education and counseling on adjustment to heart disease, stress management and health-related lifestyle change. Teach and support self-help strategies.    Expected Outcomes Short Term: Participant demonstrates changes in health-related behavior, relaxation and other stress management skills, ability to obtain effective social support, and compliance with psychotropic medications if prescribed.;Long Term: Emotional wellbeing is indicated by absence of clinically significant psychosocial distress or social isolation.             Core Components/Risk Factors/Patient Goals Review:   Goals and Risk Factor Review     Row Name 03/15/23 1420 04/02/23 1403           Core Components/Risk Factors/Patient Goals Review   Personal Goals Review Weight Management/Obesity;Hypertension;Lipids;Diabetes;Stress Weight Management/Obesity;Hypertension;Lipids;Diabetes;Stress      Review [name] started cardiac rehab on 03/15/23. [name] did well with exercise. Vital signs and CBG's were stable. [name] is doing well with exercise at cardiac rehab. . Vital signs and CBG's have been stable. [name] does not have a CBG meter at home she plans to ask her PCP to order a meter for her at her office visit this week      Expected Outcomes [name] will continue to participate in cardiac rehab for exercise, nutrition andlifestyle  modifications [name] will continue to participate in cardiac rehab for exercise, nutrition andlifestyle modifications               Core Components/Risk Factors/Patient Goals at Discharge (Final Review):   Goals and Risk Factor Review - 04/02/23 1403  Core Components/Risk Factors/Patient Goals Review   Personal Goals Review Weight Management/Obesity;Hypertension;Lipids;Diabetes;Stress    Review [name] is doing well with exercise at cardiac rehab. . Vital signs and CBG's have been stable. [name] does not have a CBG meter at home she plans to ask her PCP to order a meter for her at her office visit this week    Expected Outcomes [name] will continue to participate in cardiac rehab for exercise, nutrition andlifestyle modifications             ITP Comments:  ITP Comments     Row Name 03/07/23 1020 03/15/23 1413 04/02/23 1357       ITP Comments Dr. Armanda Magic medical director. Introduction to pritikin education/intensive cardiac rehab. Initial orientation packet reviewed with patient. 30 Day ITP Review. [name] started cardiac rehab on 03/15/23. [name] did well with exercise. 30 Day ITP Review. [name] has good participaiton when in attendance at cardiac rehab              Comments: See ITP comments.

## 2023-04-03 ENCOUNTER — Encounter (HOSPITAL_COMMUNITY): Payer: 59

## 2023-04-03 ENCOUNTER — Ambulatory Visit (HOSPITAL_COMMUNITY): Payer: 59

## 2023-04-04 ENCOUNTER — Encounter: Payer: Self-pay | Admitting: Nurse Practitioner

## 2023-04-04 ENCOUNTER — Ambulatory Visit (INDEPENDENT_AMBULATORY_CARE_PROVIDER_SITE_OTHER): Payer: 59 | Admitting: Nurse Practitioner

## 2023-04-04 VITALS — BP 146/86 | HR 83 | Temp 98.6°F | Resp 20 | Ht 64.0 in | Wt 200.0 lb

## 2023-04-04 DIAGNOSIS — E1169 Type 2 diabetes mellitus with other specified complication: Secondary | ICD-10-CM

## 2023-04-04 DIAGNOSIS — Z23 Encounter for immunization: Secondary | ICD-10-CM

## 2023-04-04 DIAGNOSIS — I10 Essential (primary) hypertension: Secondary | ICD-10-CM

## 2023-04-04 DIAGNOSIS — I2510 Atherosclerotic heart disease of native coronary artery without angina pectoris: Secondary | ICD-10-CM | POA: Diagnosis not present

## 2023-04-04 DIAGNOSIS — K5790 Diverticulosis of intestine, part unspecified, without perforation or abscess without bleeding: Secondary | ICD-10-CM | POA: Diagnosis not present

## 2023-04-04 DIAGNOSIS — G43411 Hemiplegic migraine, intractable, with status migrainosus: Secondary | ICD-10-CM

## 2023-04-04 DIAGNOSIS — E119 Type 2 diabetes mellitus without complications: Secondary | ICD-10-CM

## 2023-04-04 DIAGNOSIS — G43109 Migraine with aura, not intractable, without status migrainosus: Secondary | ICD-10-CM

## 2023-04-04 DIAGNOSIS — E039 Hypothyroidism, unspecified: Secondary | ICD-10-CM

## 2023-04-04 DIAGNOSIS — Z7984 Long term (current) use of oral hypoglycemic drugs: Secondary | ICD-10-CM

## 2023-04-04 DIAGNOSIS — F3341 Major depressive disorder, recurrent, in partial remission: Secondary | ICD-10-CM

## 2023-04-04 DIAGNOSIS — I2583 Coronary atherosclerosis due to lipid rich plaque: Secondary | ICD-10-CM

## 2023-04-04 DIAGNOSIS — F411 Generalized anxiety disorder: Secondary | ICD-10-CM

## 2023-04-04 DIAGNOSIS — E785 Hyperlipidemia, unspecified: Secondary | ICD-10-CM

## 2023-04-04 MED ORDER — GLIPIZIDE ER 10 MG PO TB24: 10.0000 mg | ORAL_TABLET | Freq: Every day | ORAL | 1 refills | Status: DC

## 2023-04-04 MED ORDER — BUPROPION HCL ER (XL) 150 MG PO TB24
450.0000 mg | ORAL_TABLET | Freq: Every day | ORAL | 0 refills | Status: DC
Start: 2023-04-04 — End: 2023-07-02

## 2023-04-04 MED ORDER — FREESTYLE LIBRE 3 SENSOR MISC: 1.0000 | 3 refills | Status: DC

## 2023-04-04 MED ORDER — CLOPIDOGREL BISULFATE 75 MG PO TABS
75.0000 mg | ORAL_TABLET | Freq: Every day | ORAL | 2 refills | Status: DC
Start: 2023-04-04 — End: 2023-07-02

## 2023-04-04 MED ORDER — LOSARTAN POTASSIUM 25 MG PO TABS
25.0000 mg | ORAL_TABLET | Freq: Every day | ORAL | 0 refills | Status: DC
Start: 2023-04-04 — End: 2023-05-21

## 2023-04-04 MED ORDER — TOPIRAMATE 50 MG PO TABS
50.0000 mg | ORAL_TABLET | Freq: Two times a day (BID) | ORAL | 1 refills | Status: DC
Start: 2023-04-04 — End: 2023-10-25

## 2023-04-04 MED ORDER — METFORMIN HCL 500 MG PO TABS: 500.0000 mg | ORAL_TABLET | Freq: Two times a day (BID) | ORAL | 1 refills | Status: DC

## 2023-04-04 MED ORDER — FREESTYLE LIBRE 3 READER DEVI: 1.0000 | Freq: Every day | 0 refills | Status: DC

## 2023-04-04 MED ORDER — ATORVASTATIN CALCIUM 80 MG PO TABS: 80.0000 mg | ORAL_TABLET | Freq: Every day | ORAL | 2 refills | Status: DC

## 2023-04-04 MED ORDER — RIZATRIPTAN BENZOATE 10 MG PO TABS
10.0000 mg | ORAL_TABLET | ORAL | 2 refills | Status: DC | PRN
Start: 2023-04-04 — End: 2023-10-25

## 2023-04-04 MED ORDER — FENOFIBRATE 160 MG PO TABS
160.0000 mg | ORAL_TABLET | Freq: Every day | ORAL | 1 refills | Status: DC
Start: 2023-04-04 — End: 2023-10-25

## 2023-04-04 NOTE — Progress Notes (Signed)
Subjective:    Patient ID: [name], female    DOB: [birth year], 62 y.o.   MRN: [id number]   Chief Complaint: Medical Management of Chronic Issues    HPI:  [name] is a 62 y.o. who identifies as a female who was assigned female at birth.   Social history: Lives with: by herself Work history: works at [employer] in today for follow up of the following chronic medical issues:  1. Primary hypertension No c/o chest pain, sob or headache. Does not check blood pressure at home. BP Readings from Last 3 Encounters:  03/07/23 120/74  02/27/23 130/84  02/19/23 (!) 159/84     2. Mixed hyperlipidemia Does not watch diet very closely and does no dedicated exercise. Lab Results  Component Value Date   CHOL 261 (H) 12/28/2022   HDL 38 (L) 12/28/2022   LDLCALC UNABLE TO CALCULATE IF TRIGLYCERIDE OVER 400 mg/dL 56/38/7564   LDLDIRECT 136 (H) 12/28/2022   TRIG 594 (H) 12/28/2022   CHOLHDL 6.9 12/28/2022     3. Diabetes mellitus treated with oral medication (HCC) Does not check blood sugars at home. She would like a CGM if insurance will cover it. Lab Results  Component Value Date   HGBA1C 8.6 (H) 12/31/2022  '  4. Coronary artery disease due to lipid rich plaque Had CABG in September. Is doing well. Last saw cardiology on 02/27/23. Was referred for cardiac rehab.  5. Diverticulosis No recent flare  up  7. Acquired hypothyroidism No issues that she is aware of Lab Results  Component Value Date   TSH 2.600 07/23/2022     8. Anxiety state Anxiety has increased with her recent health changes. She says she is trying to get better.    04/04/2023    3:18 PM 11/26/2022    4:20 PM 08/21/2022    3:47 PM 08/07/2022    2:27 PM  GAD 7 : Generalized Anxiety Score  Nervous, Anxious, on Edge 0 0 1 0  Control/stop worrying 0 0 1 0  Worry too much - different things 0 0 1 1  Trouble relaxing 0 0 1 1  Restless 0 0 0 0  Easily annoyed or  irritable 0 0 0 0  Afraid - awful might happen 0 0 0 0  Total GAD 7 Score 0 0 4 2  Anxiety Difficulty  Not difficult at all Very difficult Very difficult      9. Recurrent major depressive disorder, in partial remission (HCC) Is on wellbutrin and is doing well    04/04/2023    3:18 PM 03/07/2023   10:56 AM 11/26/2022    4:20 PM  Depression screen PHQ 2/9  Decreased Interest 0 1 0  Down, Depressed, Hopeless 0 0 0  PHQ - 2 Score 0 1 0  Altered sleeping 0 1 0  Tired, decreased energy 0 0 0  Change in appetite 0 0 0  Feeling bad or failure about yourself  0 0 0  Trouble concentrating 0 0 0  Moving slowly or fidgety/restless 0 0 0  Suicidal thoughts 0 0 0  PHQ-9 Score 0 2 0  Difficult doing work/chores  Not difficult at all Not difficult at all     10. Morbid obesity (HCC) No recent weight changes Wt Readings from Last 3 Encounters:  04/04/23 200 lb (90.7 kg)  03/07/23 202 lb 9.6 oz (91.9 kg)  02/27/23 198 lb (89.8 kg)   BMI Readings from  Last 3 Encounters:  04/04/23 34.33 kg/m  03/07/23 34.78 kg/m  02/27/23 33.99 kg/m      New complaints: None today  Allergies  Allergen Reactions   Aspirin Anaphylaxis   Bee Venom Anaphylaxis   Benadryl [Diphenhydramine Hcl] Anaphylaxis   Morphine And Codeine Shortness Of Breath and Itching   Vicks Formula 44 Cough-Cold Pm [Dm-Apap-Cpm] Shortness Of Breath and Rash    Not anaphylaxis   Outpatient Encounter Medications as of 04/04/2023  Medication Sig   albuterol (VENTOLIN HFA) 108 (90 Base) MCG/ACT inhaler Inhale 2 puffs into the lungs every 4 (four) hours as needed for wheezing or shortness of breath. (NEEDS TO BE SEEN)   atorvastatin (LIPITOR) 80 MG tablet Take 1 tablet (80 mg total) by mouth daily.   buPROPion (WELLBUTRIN XL) 150 MG 24 hr tablet TAKE 3 TABLETS BY MOUTH DAILY.   clopidogrel (PLAVIX) 75 MG tablet Take 1 tablet (75 mg total) by mouth daily.   EPINEPHrine 0.3 mg/0.3 mL IJ SOAJ injection Inject 0.3 mg into  the muscle as needed for anaphylaxis.   fenofibrate 160 MG tablet Take 1 tablet (160 mg total) by mouth daily.   glipiZIDE (GLUCOTROL XL) 10 MG 24 hr tablet Take 1 tablet (10 mg total) by mouth daily.   losartan (COZAAR) 25 MG tablet Take 1 tablet (25 mg total) by mouth daily.   Lutein 20 MG TABS Take 20 mg by mouth daily.   magnesium oxide (MAG-OX) 400 (240 Mg) MG tablet Take 1 tablet (400 mg total) by mouth 2 (two) times daily for 30 days, then as directed by provider.   metFORMIN (GLUCOPHAGE) 500 MG tablet Take 1 tablet (500 mg total) by mouth 2 (two) times daily with a meal.   ondansetron (ZOFRAN) 4 MG tablet Take 1 tablet (4 mg total) by mouth every 8 (eight) hours as needed for nausea. Deliver to pt's home   rizatriptan (MAXALT) 10 MG tablet Take 1 tablet (10 mg total) by mouth as needed for migraine. May repeat in 2 hours if needed   topiramate (TOPAMAX) 50 MG tablet Take 1 tablet (50 mg total) by mouth 2 (two) times daily.   traMADol (ULTRAM) 50 MG tablet Take 1 tablet (50 mg total) by mouth every 6 (six) hours as needed for moderate pain.   No facility-administered encounter medications on file as of 04/04/2023.    Past Surgical History:  Procedure Laterality Date   ABDOMINAL HYSTERECTOMY  02/10/2007   + lysis of adhesions   APPENDECTOMY  1990s   ruptured, late 90s   BILATERAL OOPHORECTOMY      in 2 surgeries prior to 9/08   CARPAL TUNNEL RELEASE Right 08/16/2021   Procedure: RIGHT CARPAL TUNNEL RELEASE;  Surgeon: Marlyne Beards, MD;  Location: [facility];  Service: Orthopedics;  Laterality: Right;   COLONOSCOPY  02/09/2010    normal upper endoscopy, single colonic polyp,   CORONARY ARTERY BYPASS GRAFT N/A 01/01/2023   Procedure: CORONARY ARTERY BYPASS GRAFTING (CABG) TIMES THREE USING THE LEFT INTERNAL MAMMARY ARTERY (LIMA) AND ENDOSCOPICALLY HARVESTED RIGHT GREATER SAPHENOUS VEIN;  Surgeon: Corliss Skains, MD;  Location: [facility] OR;  Service: Open Heart Surgery;   Laterality: N/A;   HERNIA REPAIR     LEFT HEART CATH AND CORONARY ANGIOGRAPHY N/A 12/28/2022   Procedure: LEFT HEART CATH AND CORONARY ANGIOGRAPHY;  Surgeon: [name], MD;  Location: [facility];  Service: Cardiovascular;  Laterality: N/A;   TEE WITHOUT CARDIOVERSION N/A 01/01/2023   Procedure:  TRANSESOPHAGEAL ECHOCARDIOGRAM;  Surgeon: Corliss Skains, MD;  Location: [facility] OR;  Service: Open Heart Surgery;  Laterality: N/A;   VENTRAL HERNIA REPAIR     x3    History reviewed. No pertinent family history.    Controlled substance contract: n/a     Review of Systems  Constitutional:  Negative for diaphoresis.  Eyes:  Negative for pain.  Respiratory:  Negative for shortness of breath.   Cardiovascular:  Negative for chest pain, palpitations and leg swelling.  Gastrointestinal:  Negative for abdominal pain.  Endocrine: Negative for polydipsia.  Skin:  Negative for rash.  Neurological:  Negative for dizziness, weakness and headaches.  Hematological:  Does not bruise/bleed easily.  All other systems reviewed and are negative.      Objective:   Physical Exam Vitals and nursing note reviewed.  Constitutional:      General: She is not in acute distress.    Appearance: Normal appearance. She is well-developed.  HENT:     Head: Normocephalic.     Right Ear: Tympanic membrane normal.     Left Ear: Tympanic membrane normal.     Nose: Nose normal.     Mouth/Throat:     Mouth: Mucous membranes are moist.  Eyes:     Pupils: Pupils are equal, round, and reactive to light.  Neck:     Vascular: No carotid bruit or JVD.  Cardiovascular:     Rate and Rhythm: Normal rate and regular rhythm.     Heart sounds: Normal heart sounds.  Pulmonary:     Effort: Pulmonary effort is normal. No respiratory distress.     Breath sounds: Normal breath sounds. No wheezing or rales.  Chest:     Chest wall: No tenderness.  Abdominal:     General: Bowel sounds are normal. There is no  distension or abdominal bruit.     Palpations: Abdomen is soft. There is no hepatomegaly, splenomegaly, mass or pulsatile mass.     Tenderness: There is no abdominal tenderness.  Musculoskeletal:        General: Normal range of motion.     Cervical back: Normal range of motion and neck supple.  Lymphadenopathy:     Cervical: No cervical adenopathy.  Skin:    General: Skin is warm and dry.  Neurological:     Mental Status: She is alert and oriented to person, place, and time.     Deep Tendon Reflexes: Reflexes are normal and symmetric.  Psychiatric:        Behavior: Behavior normal.        Thought Content: Thought content normal.        Judgment: Judgment normal.    BP (!) 146/86   Pulse 83   Temp 98.6 F (37 C) (Oral)   Resp 20   Ht 5\' 4"  (1.626 m)   Wt 200 lb (90.7 kg)   SpO2 99%   BMI 34.33 kg/m    Hgba1c 7.4%      Assessment & Plan:   SAACHI GIGER comes in today with chief complaint of Medical Management of Chronic Issues   Diagnosis and orders addressed:  1. Primary hypertension Low sodium diet - CBC with Differential/Platelet - CMP14+EGFR - losartan (COZAAR) 25 MG tablet; Take 1 tablet (25 mg total) by mouth daily.  Dispense: 30 tablet; Refill: 0  2. Diabetes mellitus treated with oral medication (HCC) Stricter carb counting - Microalbumin / creatinine urine ratio - Bayer DCA Hb A1c Waived - glipiZIDE (GLUCOTROL XL)  10 MG 24 hr tablet; Take 1 tablet (10 mg total) by mouth daily.  Dispense: 180 tablet; Refill: 1 - metFORMIN (GLUCOPHAGE) 500 MG tablet; Take 1 tablet (500 mg total) by mouth 2 (two) times daily with a meal.  Dispense: 180 tablet; Refill: 1 - Continuous Glucose Sensor (FREESTYLE LIBRE 3 SENSOR) MISC; 1 each by Does not apply route every 14 (fourteen) days.  Dispense: 2 each; Refill: 3 - Continuous Glucose Receiver (FREESTYLE LIBRE 3 READER) DEVI; 1 each by Does not apply route daily.  Dispense: 1 each; Refill: 0  3. Coronary artery  disease due to lipid rich plaque Keep follow up with cardiology Continue cardiac rehab - clopidogrel (PLAVIX) 75 MG tablet; Take 1 tablet (75 mg total) by mouth daily.  Dispense: 30 tablet; Refill: 2  4. Diverticulosis Watch diet t o prevent flare up  5. Hyperlipidemia associated with type 2 diabetes mellitus (HCC) Low fat diet - fenofibrate 160 MG tablet; Take 1 tablet (160 mg total) by mouth daily.  Dispense: 90 tablet; Refill: 1 - atorvastatin (LIPITOR) 80 MG tablet; Take 1 tablet (80 mg total) by mouth daily.  Dispense: 30 tablet; Refill: 2 - LDL Cholesterol, Direct  6. Acquired hypothyroidism Labs oending  7. Anxiety state - buPROPion (WELLBUTRIN XL) 150 MG 24 hr tablet; Take 3 tablets (450 mg total) by mouth daily.  Dispense: 270 tablet; Refill: 0  8. Recurrent major depressive disorder, in partial remission (HCC) Stress maanegement  9. Intractable hemiplegic migraine with status migrainosus - topiramate (TOPAMAX) 50 MG tablet; Take 1 tablet (50 mg total) by mouth 2 (two) times daily.  Dispense: 180 tablet; Refill: 1  10. Migraine with aura and without status migrainosus, not intractable - rizatriptan (MAXALT) 10 MG tablet; Take 1 tablet (10 mg total) by mouth as needed for migraine. May repeat in 2 hours if needed  Dispense: 10 tablet; Refill: 2   Labs pending Health Maintenance reviewed Diet and exercise encouraged  Follow up plan: 3 months   Mary-Margaret Daphine Deutscher, FNP

## 2023-04-05 ENCOUNTER — Ambulatory Visit (HOSPITAL_COMMUNITY): Payer: 59

## 2023-04-05 ENCOUNTER — Encounter (HOSPITAL_COMMUNITY): Payer: 59

## 2023-04-05 ENCOUNTER — Telehealth (HOSPITAL_COMMUNITY): Payer: Self-pay

## 2023-04-05 ENCOUNTER — Other Ambulatory Visit: Payer: Self-pay | Admitting: Nurse Practitioner

## 2023-04-05 DIAGNOSIS — E119 Type 2 diabetes mellitus without complications: Secondary | ICD-10-CM

## 2023-04-05 LAB — MICROALBUMIN / CREATININE URINE RATIO
Creatinine, Urine: 29 mg/dL
Microalb/Creat Ratio: 46 mg/g{creat} — ABNORMAL HIGH (ref 0–29)
Microalbumin, Urine: 13.4 ug/mL

## 2023-04-05 LAB — CBC WITH DIFFERENTIAL/PLATELET
Basophils Absolute: 0.1 10*3/uL (ref 0.0–0.2)
Basos: 1 %
EOS (ABSOLUTE): 0.1 10*3/uL (ref 0.0–0.4)
Eos: 2 %
Hematocrit: 37.1 % (ref 34.0–46.6)
Hemoglobin: 12.3 g/dL (ref 11.1–15.9)
Immature Grans (Abs): 0 10*3/uL (ref 0.0–0.1)
Immature Granulocytes: 1 %
Lymphocytes Absolute: 2.1 10*3/uL (ref 0.7–3.1)
Lymphs: 33 %
MCH: 28.7 pg (ref 26.6–33.0)
MCHC: 33.2 g/dL (ref 31.5–35.7)
MCV: 87 fL (ref 79–97)
Monocytes Absolute: 0.4 10*3/uL (ref 0.1–0.9)
Monocytes: 6 %
Neutrophils Absolute: 3.7 10*3/uL (ref 1.4–7.0)
Neutrophils: 57 %
Platelets: 246 10*3/uL (ref 150–450)
RBC: 4.28 x10E6/uL (ref 3.77–5.28)
RDW: 13.1 % (ref 11.7–15.4)
WBC: 6.4 10*3/uL (ref 3.4–10.8)

## 2023-04-05 LAB — CMP14+EGFR
ALT: 23 [IU]/L (ref 0–32)
AST: 16 [IU]/L (ref 0–40)
Albumin: 4.8 g/dL (ref 3.9–4.9)
Alkaline Phosphatase: 58 [IU]/L (ref 44–121)
BUN/Creatinine Ratio: 20 (ref 12–28)
BUN: 19 mg/dL (ref 8–27)
Bilirubin Total: <0.2 mg/dL (ref 0.0–1.2)
CO2: 18 mmol/L — ABNORMAL LOW (ref 20–29)
Calcium: 10 mg/dL (ref 8.7–10.3)
Chloride: 106 mmol/L (ref 96–106)
Creatinine, Ser: 0.93 mg/dL (ref 0.57–1.00)
Globulin, Total: 2.4 g/dL (ref 1.5–4.5)
Glucose: 109 mg/dL — ABNORMAL HIGH (ref 70–99)
Potassium: 4.1 mmol/L (ref 3.5–5.2)
Sodium: 142 mmol/L (ref 134–144)
Total Protein: 7.2 g/dL (ref 6.0–8.5)
eGFR: 69 mL/min/{1.73_m2} (ref >59)

## 2023-04-05 LAB — BAYER DCA HB A1C WAIVED: HB A1C (BAYER DCA - WAIVED): 7.4 % — ABNORMAL HIGH (ref 4.8–5.6)
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
## 2023-04-08 ENCOUNTER — Ambulatory Visit (HOSPITAL_COMMUNITY): Payer: 59

## 2023-04-08 ENCOUNTER — Encounter (HOSPITAL_COMMUNITY): Admission: RE | Admit: 2023-04-08 | Payer: 59 | Source: Ambulatory Visit

## 2023-04-08 NOTE — Telephone Encounter (Signed)
Name from pharmacy: FREESTYLE LIBRE 3 READER  Pharmacy comment: Alternative Requested:DRUG NOT COVERED.   Continuous Glucose Sensor (FREESTYLE LIBRE 3 PLUS SENSOR) MISC  Pharmacy comment: Alternative Requested:DRUG NOT COVERED.

## 2023-04-10 ENCOUNTER — Encounter (HOSPITAL_COMMUNITY)
Admission: RE | Admit: 2023-04-10 | Discharge: 2023-04-10 | Disposition: A | Discharge status: Home / Self Care | Payer: 59 | Source: Ambulatory Visit | Attending: Cardiology | Admitting: Cardiology

## 2023-04-10 ENCOUNTER — Ambulatory Visit (HOSPITAL_COMMUNITY): Payer: 59

## 2023-04-10 DIAGNOSIS — Z951 Presence of aortocoronary bypass graft: Secondary | ICD-10-CM

## 2023-04-10 LAB — GLUCOSE, CAPILLARY: Glucose-Capillary: 103 mg/dL — ABNORMAL HIGH (ref 70–99)

## 2023-04-12 ENCOUNTER — Ambulatory Visit (HOSPITAL_COMMUNITY): Payer: 59

## 2023-04-12 ENCOUNTER — Encounter (HOSPITAL_COMMUNITY): Payer: 59

## 2023-04-15 ENCOUNTER — Encounter (HOSPITAL_COMMUNITY): Payer: 59

## 2023-04-15 ENCOUNTER — Ambulatory Visit (HOSPITAL_COMMUNITY): Payer: 59

## 2023-04-17 ENCOUNTER — Encounter (HOSPITAL_COMMUNITY): Admission: RE | Admit: 2023-04-17 | Payer: 59 | Source: Ambulatory Visit

## 2023-04-17 ENCOUNTER — Ambulatory Visit (HOSPITAL_COMMUNITY): Payer: 59

## 2023-04-18 ENCOUNTER — Telehealth: Payer: Self-pay | Admitting: Nurse Practitioner

## 2023-04-18 MED ORDER — DEXCOM G6 SENSOR MISC: 1.0000 | 3 refills | Status: AC

## 2023-04-18 NOTE — Telephone Encounter (Signed)
Called and spoke with patient. She said insurance denied her [name] and they suggested that a Dexcom be sent in to see if that would be covered. Please send to pharmacy

## 2023-04-19 ENCOUNTER — Telehealth (HOSPITAL_COMMUNITY): Payer: Self-pay

## 2023-04-19 ENCOUNTER — Encounter (HOSPITAL_COMMUNITY): Payer: 59

## 2023-04-19 ENCOUNTER — Ambulatory Visit (HOSPITAL_COMMUNITY): Payer: 59

## 2023-04-19 ENCOUNTER — Ambulatory Visit (INDEPENDENT_AMBULATORY_CARE_PROVIDER_SITE_OTHER): Payer: 59 | Admitting: Nurse Practitioner

## 2023-04-19 ENCOUNTER — Encounter: Payer: Self-pay | Admitting: Nurse Practitioner

## 2023-04-19 VITALS — BP 137/83 | HR 78 | Temp 97.5°F | Resp 20 | Ht 64.0 in | Wt 201.0 lb

## 2023-04-19 DIAGNOSIS — L7682 Other postprocedural complications of skin and subcutaneous tissue: Secondary | ICD-10-CM

## 2023-04-19 MED ORDER — SULFAMETHOXAZOLE-TRIMETHOPRIM 800-160 MG PO TABS
1.0000 | ORAL_TABLET | Freq: Two times a day (BID) | ORAL | 0 refills | Status: DC
Start: 2023-04-19 — End: 2023-07-08
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
## 2023-04-19 NOTE — Progress Notes (Signed)
   Subjective:    Patient ID: [name], female    DOB: 30-Jun-1960, 62 y.o.   MRN: [id number]   Chief Complaint: Incision site red and painful   HPI  /Patient had  open heart surgery 01/01/23. For the last 2 weeks her incision has been hurting. Has felt hot. Rates pain 9/10 today. Pain increases if anything touches it. No drainage. Patient Active Problem List   Diagnosis Date Noted   S/P CABG x 3 01/01/2023   Non-ST elevation (NSTEMI) myocardial infarction (HCC) 12/31/2022   Coronary artery disease due to lipid rich plaque 12/31/2022   Chest pain 12/27/2022   Hypotension 12/27/2022   Carpal tunnel syndrome 08/16/2021   Numbness and tingling in right hand 07/11/2021   Diverticulosis 07/13/2016   Morbid obesity (HCC) 03/31/2015   Hyperlipidemia associated with type 2 diabetes mellitus (HCC)    Hypothyroidism    Hypertension    POLYP, COLON 04/13/2010   Type 2 diabetes mellitus (HCC) 04/13/2010   Anxiety state 04/13/2010   Depression 04/13/2010   Asthma 04/13/2010   Migraines 04/13/2010       Review of Systems  Constitutional:  Negative for diaphoresis.  Eyes:  Negative for pain.  Respiratory:  Negative for shortness of breath.   Cardiovascular:  Negative for chest pain, palpitations and leg swelling.  Gastrointestinal:  Negative for abdominal pain.  Endocrine: Negative for polydipsia.  Skin:  Negative for rash.  Neurological:  Negative for dizziness, weakness and headaches.  Hematological:  Does not bruise/bleed easily.  All other systems reviewed and are negative.      Objective:   Physical Exam Constitutional:      Appearance: Normal appearance.  Cardiovascular:     Rate and Rhythm: Normal rate and regular rhythm.     Heart sounds: Normal heart sounds.  Pulmonary:     Breath sounds: Normal breath sounds.  Skin:    General: Skin is warm.     Comments: Chest incision- well approximated- no surrounding erythema, no drainage. Tender  to touch.   Neurological:     General: No focal deficit present.     Mental Status: She is alert and oriented to person, place, and time.  Psychiatric:        Mood and Affect: Mood normal.        Behavior: Behavior normal.     BP 137/83   Pulse 78   Temp (!) 97.5 F (36.4 C) (Temporal)   Resp 20   Ht 5\' 4"  (1.626 m)   Wt 201 lb (91.2 kg)   SpO2 96%   BMI 34.50 kg/m        Assessment & Plan:   [name] in today with chief complaint of Incision site red and painful   1. Incisional pain Cool compresses RTO prn - sulfamethoxazole-trimethoprim (BACTRIM DS) 800-160 MG tablet; Take 1 tablet by mouth 2 (two) times daily.  Dispense: 20 tablet; Refill: 0    The above assessment and management plan was discussed with the patient. The patient verbalized understanding of and has agreed to the management plan. Patient is aware to call the clinic if symptoms persist or worsen. Patient is aware when to return to the clinic for a follow-up visit. Patient educated on when it is appropriate to go to the emergency department.   Mary-Margaret Daphine Deutscher, FNP

## 2023-04-22 ENCOUNTER — Ambulatory Visit: Payer: 59 | Attending: Internal Medicine | Admitting: Cardiology

## 2023-04-22 ENCOUNTER — Encounter: Payer: Self-pay | Admitting: Cardiology

## 2023-04-22 ENCOUNTER — Encounter (HOSPITAL_COMMUNITY): Payer: 59

## 2023-04-22 ENCOUNTER — Ambulatory Visit (HOSPITAL_COMMUNITY): Payer: 59

## 2023-04-22 ENCOUNTER — Telehealth: Payer: Self-pay | Admitting: Family Medicine

## 2023-04-22 VITALS — BP 138/70 | HR 90 | Ht 64.0 in | Wt 204.6 lb

## 2023-04-22 DIAGNOSIS — I2510 Atherosclerotic heart disease of native coronary artery without angina pectoris: Secondary | ICD-10-CM

## 2023-04-22 DIAGNOSIS — E1169 Type 2 diabetes mellitus with other specified complication: Secondary | ICD-10-CM

## 2023-04-22 DIAGNOSIS — I10 Essential (primary) hypertension: Secondary | ICD-10-CM | POA: Diagnosis not present

## 2023-04-22 DIAGNOSIS — E785 Hyperlipidemia, unspecified: Secondary | ICD-10-CM

## 2023-04-22 DIAGNOSIS — I2583 Coronary atherosclerosis due to lipid rich plaque: Secondary | ICD-10-CM

## 2023-04-22 DIAGNOSIS — G629 Polyneuropathy, unspecified: Secondary | ICD-10-CM

## 2023-04-22 NOTE — Patient Instructions (Signed)
Medication Instructions:  Your physician recommends that you continue on your current medications as directed. Please refer to the Current Medication list given to you today.  *If you need a refill on your cardiac medications before your next appointment, please call your pharmacy*   Lab Work: Lipids If you have labs (blood work) drawn today and your tests are completely normal, you will receive your results only by: MyChart Message (if you have MyChart) OR A paper copy in the mail If you have any lab test that is abnormal or we need to change your treatment, we will call you to review the results.   Testing/Procedures: None   Follow-Up: At [facility], you and your health needs are our priority.  As part of our continuing mission to provide you with exceptional heart care, we have created designated Provider Care Teams.  These Care Teams include your primary Cardiologist (physician) and Advanced Practice Providers (APPs -  Physician Assistants and Nurse Practitioners) who all work together to provide you with the care you need, when you need it.    Your next appointment:   4 month(s)  Provider:   Thomasene Ripple, DO

## 2023-04-22 NOTE — Telephone Encounter (Unsigned)
Copied from CRM [id number]. Topic: Clinical - Medical Advice >> Apr 22, 2023  1:26 PM Almira Coaster wrote: Reason for CRM: Patient saw her cardiologist and stated the reason her chest pain was due to her neuropathy and recommended  being placed back on the Glupapen. The cardiologist will forward clinic notes. She also stated that she did not receive the complete set up for her Continuous Glucose Sensor (DEXCOM G6 SENSOR) MISC, she states she only has a piece that she hold in her hand but does not know the name of the rest of the pieces that are missing.

## 2023-04-22 NOTE — Progress Notes (Signed)
Cardiology Office Note:    Date:  04/22/2023   ID:  [name], DOB [birth year], MRN [id number]  PCP:  Bennie Pierini, FNP  Cardiologist:  Thomasene Ripple, DO  Electrophysiologist:  Lanier Prude, MD   Referring MD: Daphine Deutscher, Mary-Margaret, *   " I am   History of Present Illness:    [name] is a 62 y.o. female with a hx of coronary artery disease status post CABG, hypertension, hyperlipidemia, diabetes mellitus, major depressive disorder, anxiety here today to establish cardiac care.  She reports that these symptoms were initially present when she first came home from the hospital, improved for a period, but have returned and worsened over the last ten days. She sought care from her primary care doctor, who prescribed an antibiotic but did not address the arm symptoms. The patient believes these symptoms are due to neuropathy, which she also experiences in her legs. She was previously on gabapentin for this condition but was taken off the medication. She also reports having had five mini strokes, as identified in a CT scan or MRI during her hospital stay for the surgery. The patient expresses frustration with her primary care doctor for not addressing her concerns and is considering changing doctors. She is eager to return to work but is struggling with completing her cardiac rehab due to the pain.  Past Medical History:  Diagnosis Date   Abdominal discomfort    Chronic abdominal and pelvic pain resulting in significant loss of time from work   Anxiety    with depression   Asthma    Chest pain    Depression    Diabetes mellitus    Drug overdose 06/12/2007   60 Naprosyn, psychiatric admission   Hyperlipidemia    Severe   Hypertension    Migraines    Tremor of both hands    on propranolol    Past Surgical History:  Procedure Laterality Date   ABDOMINAL HYSTERECTOMY  02/10/2007   + lysis of adhesions   APPENDECTOMY  1990s   ruptured, late 90s    BILATERAL OOPHORECTOMY      in 2 surgeries prior to 9/08   CARPAL TUNNEL RELEASE Right 08/16/2021   Procedure: RIGHT CARPAL TUNNEL RELEASE;  Surgeon: Marlyne Beards, MD;  Location: [facility];  Service: Orthopedics;  Laterality: Right;   COLONOSCOPY  02/09/2010    normal upper endoscopy, single colonic polyp,   CORONARY ARTERY BYPASS GRAFT N/A 01/01/2023   Procedure: CORONARY ARTERY BYPASS GRAFTING (CABG) TIMES THREE USING THE LEFT INTERNAL MAMMARY ARTERY (LIMA) AND ENDOSCOPICALLY HARVESTED RIGHT GREATER SAPHENOUS VEIN;  Surgeon: Corliss Skains, MD;  Location: [facility] OR;  Service: Open Heart Surgery;  Laterality: N/A;   HERNIA REPAIR     LEFT HEART CATH AND CORONARY ANGIOGRAPHY N/A 12/28/2022   Procedure: LEFT HEART CATH AND CORONARY ANGIOGRAPHY;  Surgeon: [name], MD;  Location: [facility];  Service: Cardiovascular;  Laterality: N/A;   TEE WITHOUT CARDIOVERSION N/A 01/01/2023   Procedure: TRANSESOPHAGEAL ECHOCARDIOGRAM;  Surgeon: Corliss Skains, MD;  Location: [facility] OR;  Service: Open Heart Surgery;  Laterality: N/A;   VENTRAL HERNIA REPAIR     x3    Current Medications: Current Meds  Medication Sig   albuterol (VENTOLIN HFA) 108 (90 Base) MCG/ACT inhaler Inhale 2 puffs into the lungs every 4 (four) hours as needed for wheezing or shortness of breath. (NEEDS TO BE SEEN)   atorvastatin (LIPITOR) 80 MG tablet Take 1  tablet (80 mg total) by mouth daily.   buPROPion (WELLBUTRIN XL) 150 MG 24 hr tablet Take 3 tablets (450 mg total) by mouth daily.   clopidogrel (PLAVIX) 75 MG tablet Take 1 tablet (75 mg total) by mouth daily.   Continuous Glucose Sensor (DEXCOM G6 SENSOR) MISC 1 each by Does not apply route every 14 (fourteen) days.   EPINEPHrine 0.3 mg/0.3 mL IJ SOAJ injection Inject 0.3 mg into the muscle as needed for anaphylaxis.   fenofibrate 160 MG tablet Take 1 tablet (160 mg total) by mouth daily.   glipiZIDE (GLUCOTROL XL) 10 MG 24 hr tablet Take 1  tablet (10 mg total) by mouth daily.   losartan (COZAAR) 25 MG tablet Take 1 tablet (25 mg total) by mouth daily.   Lutein 20 MG TABS Take 20 mg by mouth daily.   metFORMIN (GLUCOPHAGE) 500 MG tablet Take 1 tablet (500 mg total) by mouth 2 (two) times daily with a meal.   ondansetron (ZOFRAN) 4 MG tablet Take 1 tablet (4 mg total) by mouth every 8 (eight) hours as needed for nausea. Deliver to pt's home   rizatriptan (MAXALT) 10 MG tablet Take 1 tablet (10 mg total) by mouth as needed for migraine. May repeat in 2 hours if needed   sulfamethoxazole-trimethoprim (BACTRIM DS) 800-160 MG tablet Take 1 tablet by mouth 2 (two) times daily.   topiramate (TOPAMAX) 50 MG tablet Take 1 tablet (50 mg total) by mouth 2 (two) times daily.   traMADol (ULTRAM) 50 MG tablet Take 1 tablet (50 mg total) by mouth every 6 (six) hours as needed for moderate pain.     Allergies:   Aspirin, Bee venom, Benadryl [diphenhydramine hcl], Morphine and codeine, and Vicks formula 44 cough-cold pm [dm-apap-cpm]   Social History   Socioeconomic History   Marital status: Widowed    Spouse name: Not on file   Number of children: 0   Years of education: Not on file   Highest education level: Some college, no degree  Occupational History   Occupation: Theatre manager  Tobacco Use   Smoking status: Never   Smokeless tobacco: Never  Vaping Use   Vaping status: Not on file  Substance and Sexual Activity   Alcohol use: No   Drug use: No   Sexual activity: Not Currently  Other Topics Concern   Not on file  Social History Narrative   Not on file   Social Determinants of Health   Financial Resource Strain: Medium Risk (04/03/2023)   Overall Financial Resource Strain (CARDIA)    Difficulty of Paying Living Expenses: Somewhat hard  Food Insecurity: Food Insecurity Present (04/03/2023)   Hunger Vital Sign    Worried About Running Out of Food in the Last Year: Sometimes true    Ran Out of Food in  the Last Year: Sometimes true  Transportation Needs: Unmet Transportation Needs (04/03/2023)   PRAPARE - Transportation    Lack of Transportation (Medical): No    Lack of Transportation (Non-Medical): Yes  Physical Activity: Insufficiently Active (04/03/2023)   Exercise Vital Sign    Days of Exercise per Week: 3 days    Minutes of Exercise per Session: 30 min  Stress: No Stress Concern Present (04/03/2023)   [employer] of Occupational Health - Occupational Stress Questionnaire    Feeling of Stress : Only a little  Social Connections: Moderately Integrated (04/03/2023)   Social Connection and Isolation Panel [NHANES]    Frequency of Communication with Friends and  Family: More than three times a week    Frequency of Social Gatherings with Friends and Family: Twice a week    Attends Religious Services: More than 4 times per year    Active Member of [employer] or Organizations: Yes    Attends Banker Meetings: More than 4 times per year    Marital Status: Widowed     Family History: The patient's family history is not on file.  ROS:   Review of Systems  Constitution: Negative for decreased appetite, fever and weight gain.  HENT: Negative for congestion, ear discharge, hoarse voice and sore throat.   Eyes: Negative for discharge, redness, vision loss in right eye and visual halos.  Cardiovascular: Negative for chest pain, dyspnea on exertion, leg swelling, orthopnea and palpitations.  Respiratory: Negative for cough, hemoptysis, shortness of breath and snoring.   Endocrine: Negative for heat intolerance and polyphagia.  Hematologic/Lymphatic: Negative for bleeding problem. Does not bruise/bleed easily.  Skin: Negative for flushing, nail changes, rash and suspicious lesions.  Musculoskeletal: Negative for arthritis, joint pain, muscle cramps, myalgias, neck pain and stiffness.  Gastrointestinal: Negative for abdominal pain, bowel incontinence, diarrhea and excessive  appetite.  Genitourinary: Negative for decreased libido, genital sores and incomplete emptying.  Neurological: Negative for brief paralysis, focal weakness, headaches and loss of balance.  Psychiatric/Behavioral: Negative for altered mental status, depression and suicidal ideas.  Allergic/Immunologic: Negative for HIV exposure and persistent infections.    EKGs/Labs/Other Studies Reviewed:    The following studies were reviewed today:   EKG:  None today  Recent Labs: 07/23/2022: TSH 2.600 12/27/2022: B Natriuretic Peptide 46.8 01/11/2023: Magnesium 1.8 04/04/2023: ALT 23; BUN 19; Creatinine, Ser 0.93; Hemoglobin 12.3; Platelets 246; Potassium 4.1; Sodium 142  Recent Lipid Panel    Component Value Date/Time   CHOL 261 (H) 12/28/2022 0453   CHOL 365 (H) 11/26/2022 1619   CHOL 328 (H) 09/24/2012 0853   TRIG 594 (H) 12/28/2022 0453   TRIG 530 (H) 09/08/2014 1558   TRIG 497 (H) 09/24/2012 0853   TRIG 263 03/28/2010 0000   HDL 38 (L) 12/28/2022 0453   HDL 48 11/26/2022 1619   HDL 37 (L) 09/08/2014 1558   HDL 40 09/24/2012 0853   CHOLHDL 6.9 12/28/2022 0453   VLDL UNABLE TO CALCULATE IF TRIGLYCERIDE OVER 400 mg/dL 60/45/4098 1191   LDLCALC UNABLE TO CALCULATE IF TRIGLYCERIDE OVER 400 mg/dL 47/82/9562 1308   LDLCALC 175 (H) 11/26/2022 1619   LDLCALC Comment 12/31/2013 0914   LDLCALC 189 (H) 09/24/2012 0853   LDLDIRECT 136 (H) 12/28/2022 0453    Physical Exam:    VS:  BP 138/70 (BP Location: Right Arm, Patient Position: Sitting, Cuff Size: Normal)   Pulse 90   Ht 5\' 4"  (1.626 m)   Wt 204 lb 9.6 oz (92.8 kg)   SpO2 97%   BMI 35.12 kg/m     Wt Readings from Last 3 Encounters:  04/22/23 204 lb 9.6 oz (92.8 kg)  04/19/23 201 lb (91.2 kg)  04/04/23 200 lb (90.7 kg)     GEN: Well nourished, well developed in no acute distress HEENT: Normal NECK: No JVD; No carotid bruits LYMPHATICS: No lymphadenopathy CARDIAC: S1S2 noted,RRR, no murmurs, rubs, gallops RESPIRATORY:  Clear  to auscultation without rales, wheezing or rhonchi  ABDOMEN: Soft, non-tender, non-distended, +bowel sounds, no guarding. EXTREMITIES: No edema, No cyanosis, no clubbing MUSCULOSKELETAL:  No deformity  SKIN: Warm and dry NEUROLOGIC:  Alert and oriented x 3, non-focal PSYCHIATRIC:  Normal  affect, good insight  ASSESSMENT:    1. Hyperlipidemia associated with type 2 diabetes mellitus (HCC)   2. Coronary artery disease due to lipid rich plaque   3. Morbid obesity (HCC)   4. Primary hypertension   5. Neuropathy    PLAN:    Coronary artery disease-no anginal symptoms at this time.  Will continue the patient on current medication regimen.  She is on Plavix and atorvastatin.  Diabetes mellitus-this is being managed by her primary provider.  Obesity-the patient understands the need to lose weight with diet and exercise. We have discussed specific strategies for this.  Hyperlipidemia - continue with current statin medication.  Post-operative pain and neuropathy Recent increase in pain and neuropathy symptoms following triple bypass surgery. History of diabetes. Previously on gabapentin, but discontinued by another provider. -Recommend re-initiation of gabapentin by primary care provider for neuropathy treatment.  Cardiac rehabilitation Patient has started cardiac rehabilitation following triple bypass surgery but has been unable to consistently attend due to pain. -Encourage patient to continue and complete cardiac rehabilitation.  Hyperlipidemia History of high cholesterol and triglycerides. Family history of cardiovascular disease. -Order lipid panel to assess current cholesterol and triglyceride levels. -Consider adjustment of lipid-lowering medications based on results.   The patient is in agreement with the above plan. The patient left the office in stable condition.  The patient will follow up in   Medication Adjustments/Labs and Tests Ordered: Current medicines are reviewed  at length with the patient today.  Concerns regarding medicines are outlined above.  Orders Placed This Encounter  Procedures   Lipid panel   No orders of the defined types were placed in this encounter.   Patient Instructions  Medication Instructions:  Your physician recommends that you continue on your current medications as directed. Please refer to the Current Medication list given to you today.  *If you need a refill on your cardiac medications before your next appointment, please call your pharmacy*   Lab Work: Lipids If you have labs (blood work) drawn today and your tests are completely normal, you will receive your results only by: MyChart Message (if you have MyChart) OR A paper copy in the mail If you have any lab test that is abnormal or we need to change your treatment, we will call you to review the results.   Testing/Procedures: None   Follow-Up: At [facility], you and your health needs are our priority.  As part of our continuing mission to provide you with exceptional heart care, we have created designated Provider Care Teams.  These Care Teams include your primary Cardiologist (physician) and Advanced Practice Providers (APPs -  Physician Assistants and Nurse Practitioners) who all work together to provide you with the care you need, when you need it.    Your next appointment:   4 month(s)  Provider:   Thomasene Ripple, DO     Adopting a Healthy Lifestyle.  Know what a healthy weight is for you (roughly BMI <25) and aim to maintain this   Aim for 7+ servings of fruits and vegetables daily   65-80+ fluid ounces of water or unsweet tea for healthy kidneys   Limit to max 1 drink of alcohol per day; avoid smoking/tobacco   Limit animal fats in diet for cholesterol and heart health - choose grass fed whenever available   Avoid highly processed foods, and foods high in saturated/trans fats   Aim for low stress - take time to unwind and care for  your  mental health   Aim for 150 min of moderate intensity exercise weekly for heart health, and weights twice weekly for bone health   Aim for 7-9 hours of sleep daily   When it comes to diets, agreement about the perfect plan isnt easy to find, even among the experts. Experts at the [facility] of [employer] developed an idea known as the Healthy Eating Plate. Just imagine a plate divided into logical, healthy portions.   The emphasis is on diet quality:   Load up on vegetables and fruits - one-half of your plate: Aim for color and variety, and remember that potatoes dont count.   Go for whole grains - one-quarter of your plate: Whole wheat, barley, wheat berries, quinoa, oats, brown rice, and foods made with them. If you want pasta, go with whole wheat pasta.   Protein power - one-quarter of your plate: Fish, chicken, beans, and nuts are all healthy, versatile protein sources. Limit red meat.   The diet, however, does go beyond the plate, offering a few other suggestions.   Use healthy plant oils, such as olive, canola, soy, corn, sunflower and peanut. Check the labels, and avoid partially hydrogenated oil, which have unhealthy trans fats.   If youre thirsty, drink water. Coffee and tea are good in moderation, but skip sugary drinks and limit milk and dairy products to one or two daily servings.   The type of carbohydrate in the diet is more important than the amount. Some sources of carbohydrates, such as vegetables, fruits, whole grains, and beans-are healthier than others.   Finally, stay active  Signed, Thomasene Ripple, DO  04/22/2023 11:28 AM    [facility]

## 2023-04-23 LAB — LIPID PANEL
Chol/HDL Ratio: 6 ratio — ABNORMAL HIGH (ref 0.0–4.4)
Cholesterol, Total: 323 mg/dL — ABNORMAL HIGH (ref 100–199)
HDL: 54 mg/dL (ref >39)
LDL Chol Calc (NIH): 210 mg/dL — ABNORMAL HIGH (ref 0–99)
Triglycerides: 293 mg/dL — ABNORMAL HIGH (ref 0–149)
VLDL Cholesterol Cal: 59 mg/dL — ABNORMAL HIGH (ref 5–40)

## 2023-04-23 NOTE — Telephone Encounter (Signed)
Called and left message with patient that gabapentin sent to pharmacy and also sensors have been sent to pharmacy as well. Advised patient to contact the office if she needs anything further

## 2023-04-24 ENCOUNTER — Encounter (HOSPITAL_COMMUNITY): Payer: 59

## 2023-04-24 ENCOUNTER — Ambulatory Visit (HOSPITAL_COMMUNITY): Payer: 59

## 2023-04-24 ENCOUNTER — Telehealth (HOSPITAL_COMMUNITY): Payer: Self-pay

## 2023-04-24 NOTE — Telephone Encounter (Signed)
[facility] and left a vm. She will not be returning this week as she seen her cardiologist on Monday. They have diagnosed her with neuropathy and have told her to stay out of cardiac rehab for the rest of the week. Hopeful she will return on Monday.

## 2023-04-26 ENCOUNTER — Encounter (HOSPITAL_COMMUNITY): Payer: 59

## 2023-04-26 ENCOUNTER — Ambulatory Visit (HOSPITAL_COMMUNITY): Payer: 59

## 2023-04-29 ENCOUNTER — Ambulatory Visit (HOSPITAL_COMMUNITY): Payer: 59

## 2023-04-29 ENCOUNTER — Encounter (HOSPITAL_COMMUNITY): Payer: 59

## 2023-04-29 ENCOUNTER — Telehealth: Payer: Self-pay | Admitting: Family Medicine

## 2023-04-29 ENCOUNTER — Other Ambulatory Visit: Payer: Self-pay

## 2023-04-29 MED ORDER — DEXCOM G6 RECEIVER DEVI: 1.0000 | Freq: Four times a day (QID) | 0 refills | Status: AC

## 2023-04-29 MED ORDER — DEXCOM G6 TRANSMITTER MISC: 1.0000 | Freq: Four times a day (QID) | 0 refills | Status: AC

## 2023-04-29 NOTE — Telephone Encounter (Signed)
Sent!

## 2023-04-29 NOTE — Telephone Encounter (Signed)
Copied from CRM [id number]. Topic: Clinical - Medication Question >> Apr 29, 2023  1:30 PM Theodis Sato wrote: Reason for CRM: PT is very upset because her dexcom is not yet at [facility] and states that she's been told everything has been taken care but pharmacy doesn't have the prescription. PT would like a call back.

## 2023-04-30 ENCOUNTER — Telehealth (HOSPITAL_COMMUNITY): Payer: Self-pay | Admitting: *Deleted

## 2023-04-30 ENCOUNTER — Encounter (HOSPITAL_COMMUNITY): Payer: Self-pay | Admitting: *Deleted

## 2023-04-30 DIAGNOSIS — Z951 Presence of aortocoronary bypass graft: Secondary | ICD-10-CM

## 2023-04-30 NOTE — Progress Notes (Signed)
Discharge Progress Report  Patient Details  Name: [name] MRN: [id number] Date of Birth: [birth year] Referring Provider:   Flowsheet Row INTENSIVE CARDIAC REHAB ORIENT from 03/07/2023 in [facility]  Referring Provider Thomasene Ripple, DO        Number of Visits: 12  Reason for Discharge:  Early Exit:  medical issues, lack of attendance  Smoking History:  Social History   Tobacco Use  Smoking Status Never  Smokeless Tobacco Never    Diagnosis:  01/01/23 CABG x 3  ADL UCSD:   Initial Exercise Prescription:  Initial Exercise Prescription - 03/07/23 1200       Date of Initial Exercise RX and Referring Provider   Date 03/07/23    Referring Provider Thomasene Ripple, DO    Expected Discharge Date 05/29/23      Recumbant Bike   Level 1    RPM 50    Watts 50    Minutes 15    METs 2.5      NuStep   Level 1    SPM 65    Minutes 15    METs 2      Prescription Details   Frequency (times per week) 3    Duration Progress to 30 minutes of continuous aerobic without signs/symptoms of physical distress      Intensity   THRR 40-80% of Max Heartrate 63-126    Ratings of Perceived Exertion 11-13    Perceived Dyspnea 0-4      Progression   Progression Continue progressive overload as per policy without signs/symptoms or physical distress.      Resistance Training   Training Prescription Yes    Weight 3    Reps 10-15             Discharge Exercise Prescription (Final Exercise Prescription Changes):  Exercise Prescription Changes - 04/10/23 1554       Response to Exercise   Blood Pressure (Admit) 140/64    Blood Pressure (Exercise) 138/78    Blood Pressure (Exit) 112/70    Heart Rate (Admit) 90 bpm    Heart Rate (Exercise) 130 bpm    Heart Rate (Exit) 101 bpm    Rating of Perceived Exertion (Exercise) 12    Symptoms None    Comments Patients last exercise day in the CRP2 program    Duration  Continue with 30 min of aerobic exercise without signs/symptoms of physical distress.    Intensity THRR unchanged      Progression   Progression Continue to progress workloads to maintain intensity without signs/symptoms of physical distress.    Average METs 2.6      Resistance Training   Training Prescription No    Weight No weights on Wednesday     Reps 10-15    Time 10 Minutes      Interval Training   Interval Training No      NuStep   Level 1    SPM 102    Minutes 15    METs 2.3      Recumbant Elliptical   Level 1    RPM 53    Watts 69    Minutes 15    METs 2.9             Functional Capacity:  6 Minute Walk     Row Name 03/07/23 1203         6 Minute Walk   Phase Initial  Distance 1440 feet     Walk Time 6 minutes     # of Rest Breaks 0     MPH 2.73     METS 3.63     RPE 11     Perceived Dyspnea  0     VO2 Peak 12.69     Symptoms No     Resting HR 73 bpm     Resting BP 120/74     Resting Oxygen Saturation  98 %     Exercise Oxygen Saturation  during 6 min walk 98 %     Max Ex. HR 126 bpm     Max Ex. BP 156/78     2 Minute Post BP 124/80              Psychological, QOL, Others - Outcomes: PHQ 2/9:    04/19/2023   10:57 AM 04/04/2023    3:18 PM 03/07/2023   10:56 AM 11/26/2022    4:20 PM 08/21/2022    3:47 PM  Depression screen PHQ 2/9  Decreased Interest 0 0 1 0 1  Down, Depressed, Hopeless 0 0 0 0 1  PHQ - 2 Score 0 0 1 0 2  Altered sleeping 1 0 1 0 1  Tired, decreased energy 1 0 0 0 1  Change in appetite 0 0 0 0 1  Feeling bad or failure about yourself  1 0 0 0 1  Trouble concentrating 0 0 0 0 0  Moving slowly or fidgety/restless 0 0 0 0 0  Suicidal thoughts 0 0 0 0 0  PHQ-9 Score 3 0 2 0 6  Difficult doing work/chores Somewhat difficult  Not difficult at all Not difficult at all Very difficult    Quality of Life:  Quality of Life - 03/07/23 1121       Quality of Life   Select Quality of Life      Quality of Life  Scores   Health/Function Pre 22.13 %    Socioeconomic Pre 17.14 %    Psych/Spiritual Pre 24.43 %    Family Pre 12.5 %    GLOBAL Pre 20.64 %             Personal Goals: Goals established at orientation with interventions provided to work toward goal.  Personal Goals and Risk Factors at Admission - 03/07/23 1022       Core Components/Risk Factors/Patient Goals on Admission    Weight Management Yes;Obesity;Weight Loss    Intervention Weight Management: Develop a combined nutrition and exercise program designed to reach desired caloric intake, while maintaining appropriate intake of nutrient and fiber, sodium and fats, and appropriate energy expenditure required for the weight goal.;Weight Management: Provide education and appropriate resources to help participant work on and attain dietary goals.;Weight Management/Obesity: Establish reasonable short term and long term weight goals.;Obesity: Provide education and appropriate resources to help participant work on and attain dietary goals.    Expected Outcomes Short Term: Continue to assess and modify interventions until short term weight is achieved;Long Term: Adherence to nutrition and physical activity/exercise program aimed toward attainment of established weight goal;Weight Loss: Understanding of general recommendations for a balanced deficit meal plan, which promotes 1-2 lb weight loss per week and includes a negative energy balance of [phone] kcal/d;Understanding recommendations for meals to include 15-35% energy as protein, 25-35% energy from fat, 35-60% energy from carbohydrates, less than 200mg  of dietary cholesterol, 20-35 gm of total fiber daily;Understanding of distribution of calorie intake throughout the  day with the consumption of 4-5 meals/snacks    Diabetes Yes    Intervention Provide education about signs/symptoms and action to take for hypo/hyperglycemia.;Provide education about proper nutrition, including hydration, and  aerobic/resistive exercise prescription along with prescribed medications to achieve blood glucose in normal ranges: Fasting glucose 65-99 mg/dL    Expected Outcomes Short Term: Participant verbalizes understanding of the signs/symptoms and immediate care of hyper/hypoglycemia, proper foot care and importance of medication, aerobic/resistive exercise and nutrition plan for blood glucose control.;Long Term: Attainment of HbA1C < 7%.    Hypertension Yes    Intervention Provide education on lifestyle modifcations including regular physical activity/exercise, weight management, moderate sodium restriction and increased consumption of fresh fruit, vegetables, and low fat dairy, alcohol moderation, and smoking cessation.;Monitor prescription use compliance.    Expected Outcomes Short Term: Continued assessment and intervention until BP is < 140/75mm HG in hypertensive participants. < 130/62mm HG in hypertensive participants with diabetes, heart failure or chronic kidney disease.;Long Term: Maintenance of blood pressure at goal levels.    Lipids Yes    Intervention Provide education and support for participant on nutrition & aerobic/resistive exercise along with prescribed medications to achieve LDL 70mg , HDL >40mg .    Expected Outcomes Short Term: Participant states understanding of desired cholesterol values and is compliant with medications prescribed. Participant is following exercise prescription and nutrition guidelines.;Long Term: Cholesterol controlled with medications as prescribed, with individualized exercise RX and with personalized nutrition plan. Value goals: LDL < 70mg , HDL > 40 mg.    Stress Yes    Intervention Refer participants experiencing significant psychosocial distress to appropriate mental health specialists for further evaluation and treatment. When possible, include family members and significant others in education/counseling sessions.;Offer individual and/or small group education and  counseling on adjustment to heart disease, stress management and health-related lifestyle change. Teach and support self-help strategies.    Expected Outcomes Short Term: Participant demonstrates changes in health-related behavior, relaxation and other stress management skills, ability to obtain effective social support, and compliance with psychotropic medications if prescribed.;Long Term: Emotional wellbeing is indicated by absence of clinically significant psychosocial distress or social isolation.              Personal Goals Discharge:  Goals and Risk Factor Review     Row Name 03/15/23 1420 04/02/23 1403 04/30/23 1436         Core Components/Risk Factors/Patient Goals Review   Personal Goals Review Weight Management/Obesity;Hypertension;Lipids;Diabetes;Stress Weight Management/Obesity;Hypertension;Lipids;Diabetes;Stress Weight Management/Obesity;Hypertension;Lipids;Diabetes;Stress     Review [name] started cardiac rehab on 03/15/23. [name] did well with exercise. Vital signs and CBG's were stable. [name] is doing well with exercise at cardiac rehab. . Vital signs and CBG's have been stable. [name] does not have a CBG meter at home she plans to ask her PCP to order a meter for her at her office visit this week [name] attended 12 exercise sessions between 03/07/23- 04/10/23. [name] did not return to cardiac rehab due to neuropathy and chest pain.     Expected Outcomes [name] will continue to participate in cardiac rehab for exercise, nutrition andlifestyle modifications [name] will continue to participate in cardiac rehab for exercise, nutrition andlifestyle modifications [name] will continue to participate in cardiac rehab for exercise, nutrition andlifestyle modifications              Exercise Goals and Review:  Exercise Goals     Row Name 03/07/23 1021             Exercise Goals  Increase Physical Activity Yes       Intervention Provide advice, education, support and counseling about physical  activity/exercise needs.;Develop an individualized exercise prescription for aerobic and resistive training based on initial evaluation findings, risk stratification, comorbidities and participant's personal goals.       Expected Outcomes Short Term: Attend rehab on a regular basis to increase amount of physical activity.;Long Term: Exercising regularly at least 3-5 days a week.;Long Term: Add in home exercise to make exercise part of routine and to increase amount of physical activity.       Increase Strength and Stamina Yes       Intervention Provide advice, education, support and counseling about physical activity/exercise needs.;Develop an individualized exercise prescription for aerobic and resistive training based on initial evaluation findings, risk stratification, comorbidities and participant's personal goals.       Expected Outcomes Short Term: Increase workloads from initial exercise prescription for resistance, speed, and METs.;Short Term: Perform resistance training exercises routinely during rehab and add in resistance training at home;Long Term: Improve cardiorespiratory fitness, muscular endurance and strength as measured by increased METs and functional capacity ( )       Able to understand and use rate of perceived exertion (RPE) scale Yes       Intervention Provide education and explanation on how to use RPE scale       Expected Outcomes Short Term: Able to use RPE daily in rehab to express subjective intensity level;Long Term:  Able to use RPE to guide intensity level when exercising independently       Knowledge and understanding of Target Heart Rate Range (THRR) Yes       Intervention Provide education and explanation of THRR including how the numbers were predicted and where they are located for reference       Expected Outcomes Short Term: Able to state/look up THRR;Short Term: Able to use daily as guideline for intensity in rehab;Long Term: Able to use THRR to govern intensity  when exercising independently       Understanding of Exercise Prescription Yes       Intervention Provide education, explanation, and written materials on patient's individual exercise prescription       Expected Outcomes Short Term: Able to explain program exercise prescription;Long Term: Able to explain home exercise prescription to exercise independently                Exercise Goals Re-Evaluation:  Exercise Goals Re-Evaluation     Row Name 03/15/23 1540             Exercise Goal Re-Evaluation   Exercise Goals Review Increase Physical Activity;Increase Strength and Stamina;Able to understand and use rate of perceived exertion (RPE) scale;Knowledge and understanding of Target Heart Rate Range (THRR);Understanding of Exercise Prescription       Comments Pt first day in CRP2 program, tolerated exercise well with avg MET level of 2.05 with no s/sx. Pt tried treadmill, but did not tolerate well due tob balance concerns, moved to nustep and RB and did great. Pt is learning THRR, RPE, and ExRx. Overall pt off to good start.       Expected Outcomes Will continue to progress workloads as tolerated.                Nutrition & Weight - Outcomes:  Pre Biometrics - 03/07/23 1054       Pre Biometrics   Waist Circumference 45.5 inches    Hip Circumference 44 inches    Waist  to Hip Ratio 1.03 %    Triceps Skinfold 40 mm    % Body Fat 48 %    Grip Strength 35 kg    Flexibility 0 in   could not reach   Single Leg Stand 6.18 seconds              Nutrition:  Nutrition Therapy & Goals - 04/16/23 1438       Nutrition Therapy   Diet Heart Healthy/Carbohydrate Consistent Diet    Drug/Food Interactions Statins/Certain Fruits      Personal Nutrition Goals   Nutrition Goal Patient to identify strategies for reducing cardiovascular risk by attending the [facility] education and nutrition series weekly.   goal not met.   Personal Goal #2 Patient to improve diet quality by using the  plate method as a guide for meal planning to include lean protein/plant protein, fruits, vegetables, whole grains, nonfat dairy as part of a well-balanced diet.   goal in progress.   Personal Goal #3 Patient to identify strategies for improving blood sugar control with goal A1c <7%   goal in action.   Personal Goal #4 Patient to reduce sodium intake 1500mg  per day   goal in progress.   Comments Goals in progress. [name]'s attendance to intensive cardiac rehab remains variable; she has attended 5 [facility] education/nutrition classes. Patient with medical history NSTEMI, CAD, DM2, HTN, CABGx3, Diverticulitis. Her A1c and LDL remain above goal; however, her A1c has improved to 7.4. She is up 2.7# since starting with our program. Patient will benefit from participation in intensive cardiac rehab for nutrition, exercise, and lifestyle modification.      Intervention Plan   Intervention Prescribe, educate and counsel regarding individualized specific dietary modifications aiming towards targeted core components such as weight, hypertension, lipid management, diabetes, heart failure and other comorbidities.;Nutrition handout(s) given to patient.    Expected Outcomes Short Term Goal: Understand basic principles of dietary content, such as calories, fat, sodium, cholesterol and nutrients.;Long Term Goal: Adherence to prescribed nutrition plan.             Nutrition Discharge:   Education Questionnaire Score:  Knowledge Questionnaire Score - 03/07/23 1044       Knowledge Questionnaire Score   Pre Score 20/24             [name] attended 12 exercise sessions between 03/07/23- 04/10/23. [name] did not return to cardiac rehab due to neuropathy and chest pain. [name] has followed up with her PCP and is following with vascular at [facility]  regarding this.Thayer Headings RN BSN

## 2023-04-30 NOTE — Telephone Encounter (Signed)
Spoke with [name] she continues to have neuropathy and  chest pain and has been referred to a vascular surgeon. [name] would like to be discharged from cardiac rehab at this time. [name] says she may try to return to complete her sessions at a later date and time. [name] says she appreciates the care she received while enrolled in cardiac rehab.Thayer Headings RN BSN

## 2023-05-01 ENCOUNTER — Encounter (HOSPITAL_COMMUNITY): Payer: 59

## 2023-05-01 ENCOUNTER — Ambulatory Visit (HOSPITAL_COMMUNITY): Payer: 59

## 2023-05-02 ENCOUNTER — Telehealth: Payer: Self-pay | Admitting: Family Medicine

## 2023-05-02 NOTE — Telephone Encounter (Signed)
Copied from CRM [id number]. Topic: Clinical - Medical Advice >> May 02, 2023  8:31 AM Gaetano Hawthorne wrote: Reason for CRM: Patient called to see Mary-Margaret Daphine Deutscher, FNP - for margarines in the last 3 days - Contact center agent made the first available appt for Monday at 9:45 am - patient mentioned that she has often been able to call and speak with her nurse and they find a spot with her - patient declined to see any other provider, patient was offerred the Monday appt again and we also offered to place heron waitlist. If possible, patient would like a sooner appointment if one becomes available.

## 2023-05-03 ENCOUNTER — Encounter (HOSPITAL_COMMUNITY): Payer: 59

## 2023-05-03 ENCOUNTER — Ambulatory Visit (HOSPITAL_COMMUNITY): Payer: 59

## 2023-05-06 ENCOUNTER — Ambulatory Visit: Payer: 59 | Admitting: Nurse Practitioner

## 2023-05-06 ENCOUNTER — Ambulatory Visit (HOSPITAL_COMMUNITY): Payer: 59

## 2023-05-06 ENCOUNTER — Encounter (HOSPITAL_COMMUNITY): Payer: 59

## 2023-05-07 ENCOUNTER — Encounter: Payer: Self-pay | Admitting: Nurse Practitioner

## 2023-05-08 ENCOUNTER — Encounter (HOSPITAL_COMMUNITY): Payer: 59

## 2023-05-08 ENCOUNTER — Other Ambulatory Visit: Payer: Self-pay

## 2023-05-08 ENCOUNTER — Ambulatory Visit (HOSPITAL_COMMUNITY): Payer: 59

## 2023-05-08 DIAGNOSIS — E785 Hyperlipidemia, unspecified: Secondary | ICD-10-CM

## 2023-05-08 MED ORDER — EZETIMIBE 10 MG PO TABS: 10.0000 mg | ORAL_TABLET | Freq: Every day | ORAL | 3 refills | Status: DC

## 2023-05-10 ENCOUNTER — Encounter (HOSPITAL_COMMUNITY): Payer: 59

## 2023-05-10 ENCOUNTER — Ambulatory Visit (HOSPITAL_COMMUNITY): Payer: 59

## 2023-05-13 ENCOUNTER — Ambulatory Visit (HOSPITAL_COMMUNITY): Payer: 59

## 2023-05-13 ENCOUNTER — Encounter (HOSPITAL_COMMUNITY): Payer: 59

## 2023-05-15 ENCOUNTER — Ambulatory Visit (HOSPITAL_COMMUNITY): Payer: 59

## 2023-05-15 ENCOUNTER — Encounter (HOSPITAL_COMMUNITY): Payer: 59

## 2023-05-17 ENCOUNTER — Encounter (HOSPITAL_COMMUNITY): Payer: 59

## 2023-05-17 ENCOUNTER — Ambulatory Visit (HOSPITAL_COMMUNITY): Payer: 59

## 2023-05-20 ENCOUNTER — Encounter (HOSPITAL_COMMUNITY): Payer: 59

## 2023-05-20 ENCOUNTER — Ambulatory Visit (HOSPITAL_COMMUNITY): Payer: 59

## 2023-05-20 ENCOUNTER — Encounter: Payer: Self-pay | Admitting: Nurse Practitioner

## 2023-05-20 NOTE — Telephone Encounter (Signed)
 Care team updated and letter sent for eye exam notes.

## 2023-05-21 ENCOUNTER — Other Ambulatory Visit: Payer: Self-pay | Admitting: Nurse Practitioner

## 2023-05-21 DIAGNOSIS — I10 Essential (primary) hypertension: Secondary | ICD-10-CM

## 2023-05-22 ENCOUNTER — Encounter (HOSPITAL_COMMUNITY): Payer: 59

## 2023-05-22 ENCOUNTER — Ambulatory Visit (HOSPITAL_COMMUNITY): Payer: 59

## 2023-05-24 ENCOUNTER — Ambulatory Visit (HOSPITAL_COMMUNITY): Payer: 59

## 2023-05-24 ENCOUNTER — Encounter (HOSPITAL_COMMUNITY): Payer: 59

## 2023-05-27 ENCOUNTER — Ambulatory Visit (HOSPITAL_COMMUNITY): Payer: 59

## 2023-05-27 ENCOUNTER — Encounter (HOSPITAL_COMMUNITY): Payer: 59

## 2023-05-29 ENCOUNTER — Ambulatory Visit (HOSPITAL_COMMUNITY): Payer: 59

## 2023-05-29 ENCOUNTER — Encounter (HOSPITAL_COMMUNITY): Payer: 59

## 2023-06-28 ENCOUNTER — Ambulatory Visit: Payer: 59 | Admitting: Nurse Practitioner

## 2023-07-02 ENCOUNTER — Other Ambulatory Visit: Payer: Self-pay | Admitting: Nurse Practitioner

## 2023-07-02 DIAGNOSIS — I2510 Atherosclerotic heart disease of native coronary artery without angina pectoris: Secondary | ICD-10-CM

## 2023-07-02 DIAGNOSIS — E1169 Type 2 diabetes mellitus with other specified complication: Secondary | ICD-10-CM

## 2023-07-02 DIAGNOSIS — F411 Generalized anxiety disorder: Secondary | ICD-10-CM

## 2023-07-08 ENCOUNTER — Ambulatory Visit (INDEPENDENT_AMBULATORY_CARE_PROVIDER_SITE_OTHER): Payer: Self-pay | Admitting: Nurse Practitioner

## 2023-07-08 VITALS — BP 155/76 | HR 97 | Temp 97.5°F | Ht 64.0 in | Wt 205.0 lb

## 2023-07-08 DIAGNOSIS — R42 Dizziness and giddiness: Secondary | ICD-10-CM

## 2023-07-08 MED ORDER — MECLIZINE HCL 25 MG PO TABS: 25.0000 mg | ORAL_TABLET | Freq: Three times a day (TID) | ORAL | 0 refills | Status: AC | PRN

## 2023-07-08 NOTE — Patient Instructions (Signed)
Vertigo Vertigo is the feeling that you or the things around you are moving or spinning when they're not. It's different than feeling dizzy. It can also cause: Loss of balance. Trouble standing or walking. Nausea and vomiting. This feeling can come and go at any time. It can last from a few seconds to minutes or even hours. It may go away on its own or be treated with medicine. What are the types of vertigo? There are two types of vertigo: Peripheral vertigo happens when parts of your inner ear don't work like they should. This is the more common type. Central vertigo happens when your brain and spinal cord don't work like they should. Your health care provider will do tests to find out what kind of vertigo you have. This will help them decide on the right treatment for you. Follow these instructions at home: Eating and drinking Drink enough fluid to keep your pee (urine) pale yellow. Do not drink alcohol. Activity When you get up in the morning, first sit up on the side of the bed. When you feel okay, stand slowly while holding onto something. Move slowly. Avoid sudden body or head movements. Avoid certain positions, as told by your provider. Use a cane if you have trouble standing or walking. Sit down right away if you feel unsteady. Place items in your home so they're easy for you to reach without bending or leaning over. Return to normal activities when you're told. Ask what things are safe for you to do. General instructions Take your medicines only as told by your provider. Contact a health care provider if: Your medicines don't help or make your vertigo worse. You get new symptoms. You have a fever. You have nausea or vomiting. Your family or friends spot any changes in how you're acting. A part of your body goes numb. You feel tingling and prickling in a part of your body. You get very bad headaches. Get help right away if: You're always dizzy or you faint. You have a  stiff neck. You have trouble moving or speaking. Your hands, arms, or legs feel weak. Your hearing or eyesight changes. These symptoms may be an emergency. Call 911 right away. Do not wait to see if the symptoms will go away. Do not drive yourself to the hospital. This information is not intended to replace advice given to you by your health care provider. Make sure you discuss any questions you have with your health care provider. Document Revised: 02/28/2023 Document Reviewed: 08/31/2022 Elsevier Patient Education  2024 [employer].

## 2023-07-08 NOTE — Progress Notes (Signed)
   Subjective:    Patient ID: [name], female    DOB: [birth year], 63 y.o.   MRN: [id number]   Chief Complaint: Dizziness   Dizziness This is a new problem. The current episode started in the past 7 days. The problem occurs intermittently. The problem has been waxing and waning. Associated symptoms include vertigo. Pertinent negatives include no arthralgias, fatigue, urinary symptoms, visual change, vomiting or weakness. Associated symptoms comments: Only occurs when going from laying  to sitting or sitting to standing. Nothing aggravates the symptoms. She has tried nothing for the symptoms. The treatment provided mild relief.    Patient Active Problem List   Diagnosis Date Noted   S/P CABG x 3 01/01/2023   Non-ST elevation (NSTEMI) myocardial infarction (HCC) 12/31/2022   Coronary artery disease due to lipid rich plaque 12/31/2022   Chest pain 12/27/2022   Hypotension 12/27/2022   Carpal tunnel syndrome 08/16/2021   Numbness and tingling in right hand 07/11/2021   Diverticulosis 07/13/2016   Morbid obesity (HCC) 03/31/2015   Hyperlipidemia associated with type 2 diabetes mellitus (HCC)    Hypothyroidism    Hypertension    POLYP, COLON 04/13/2010   Type 2 diabetes mellitus (HCC) 04/13/2010   Anxiety state 04/13/2010   Depression 04/13/2010   Asthma 04/13/2010   Migraines 04/13/2010       Review of Systems  Constitutional:  Negative for fatigue.  Gastrointestinal:  Negative for vomiting.  Musculoskeletal:  Negative for arthralgias.  Neurological:  Positive for dizziness and vertigo. Negative for weakness.       Objective:   Physical Exam Constitutional:      Appearance: Normal appearance. She is obese.  Cardiovascular:     Rate and Rhythm: Normal rate and regular rhythm.     Heart sounds: Normal heart sounds.  Pulmonary:     Effort: Pulmonary effort is normal.     Breath sounds: Normal breath sounds.  Skin:    General: Skin is warm.  Neurological:      General: No focal deficit present.     Mental Status: She is alert and oriented to person, place, and time.  Psychiatric:        Mood and Affect: Mood normal.        Behavior: Behavior normal.     BP (!) 155/76   Pulse 97   Temp (!) 97.5 F (36.4 C) (Temporal)   Ht 5\' 4"  (1.626 m)   Wt 205 lb (93 kg)   SpO2 98%   BMI 35.19 kg/m        Assessment & Plan:   [name] in today with chief complaint of Dizziness   1. Vertigo (Primary) Force fluids Rest Bonine OTC can be taken and doe snot cause dizziness - meclizine (ANTIVERT) 25 MG tablet; Take 1 tablet (25 mg total) by mouth 3 (three) times daily as needed.  Dispense: 30 tablet; Refill: 0    The above assessment and management plan was discussed with the patient. The patient verbalized understanding of and has agreed to the management plan. Patient is aware to call the clinic if symptoms persist or worsen. Patient is aware when to return to the clinic for a follow-up visit. Patient educated on when it is appropriate to go to the emergency department.   Mary-Margaret Daphine Deutscher, FNP

## 2023-08-07 ENCOUNTER — Other Ambulatory Visit: Payer: Self-pay | Admitting: Nurse Practitioner

## 2023-08-07 DIAGNOSIS — E782 Mixed hyperlipidemia: Secondary | ICD-10-CM

## 2023-08-17 ENCOUNTER — Other Ambulatory Visit: Payer: Self-pay | Admitting: Nurse Practitioner

## 2023-08-17 DIAGNOSIS — E1140 Type 2 diabetes mellitus with diabetic neuropathy, unspecified: Secondary | ICD-10-CM

## 2023-08-29 ENCOUNTER — Ambulatory Visit: Payer: Self-pay | Admitting: Cardiology

## 2023-09-11 ENCOUNTER — Telehealth: Payer: Self-pay | Admitting: Nurse Practitioner

## 2023-09-19 ENCOUNTER — Other Ambulatory Visit: Payer: Self-pay | Admitting: Nurse Practitioner

## 2023-09-19 DIAGNOSIS — I10 Essential (primary) hypertension: Secondary | ICD-10-CM

## 2023-10-20 ENCOUNTER — Other Ambulatory Visit: Payer: Self-pay | Admitting: Nurse Practitioner

## 2023-10-20 DIAGNOSIS — I10 Essential (primary) hypertension: Secondary | ICD-10-CM

## 2023-10-21 NOTE — Telephone Encounter (Signed)
 MMM pt NTBS 30-d given 09/19/23

## 2023-10-21 NOTE — Telephone Encounter (Signed)
 Scheduled appt may 16

## 2023-10-25 ENCOUNTER — Encounter: Payer: Self-pay | Admitting: Nurse Practitioner

## 2023-10-25 ENCOUNTER — Ambulatory Visit (INDEPENDENT_AMBULATORY_CARE_PROVIDER_SITE_OTHER): Payer: Self-pay | Admitting: Nurse Practitioner

## 2023-10-25 VITALS — BP 135/71 | HR 67 | Temp 97.7°F | Ht 64.0 in | Wt 197.0 lb

## 2023-10-25 DIAGNOSIS — F411 Generalized anxiety disorder: Secondary | ICD-10-CM

## 2023-10-25 DIAGNOSIS — I2583 Coronary atherosclerosis due to lipid rich plaque: Secondary | ICD-10-CM

## 2023-10-25 DIAGNOSIS — I2510 Atherosclerotic heart disease of native coronary artery without angina pectoris: Secondary | ICD-10-CM

## 2023-10-25 DIAGNOSIS — E119 Type 2 diabetes mellitus without complications: Secondary | ICD-10-CM

## 2023-10-25 DIAGNOSIS — G43109 Migraine with aura, not intractable, without status migrainosus: Secondary | ICD-10-CM

## 2023-10-25 DIAGNOSIS — I10 Essential (primary) hypertension: Secondary | ICD-10-CM

## 2023-10-25 DIAGNOSIS — Z7984 Long term (current) use of oral hypoglycemic drugs: Secondary | ICD-10-CM

## 2023-10-25 DIAGNOSIS — E1169 Type 2 diabetes mellitus with other specified complication: Secondary | ICD-10-CM

## 2023-10-25 DIAGNOSIS — G43411 Hemiplegic migraine, intractable, with status migrainosus: Secondary | ICD-10-CM

## 2023-10-25 DIAGNOSIS — E785 Hyperlipidemia, unspecified: Secondary | ICD-10-CM

## 2023-10-25 LAB — CMP14+EGFR
ALT: 26 IU/L (ref 0–32)
AST: 19 IU/L (ref 0–40)
Albumin: 4.5 g/dL (ref 3.9–4.9)
Alkaline Phosphatase: 66 IU/L (ref 44–121)
BUN/Creatinine Ratio: 16 (ref 12–28)
BUN: 18 mg/dL (ref 8–27)
Bilirubin Total: <0.2 mg/dL (ref 0.0–1.2)
CO2: 19 mmol/L — ABNORMAL LOW (ref 20–29)
Calcium: 9.9 mg/dL (ref 8.7–10.3)
Chloride: 104 mmol/L (ref 96–106)
Creatinine, Ser: 1.14 mg/dL — ABNORMAL HIGH (ref 0.57–1.00)
Globulin, Total: 2.5 g/dL (ref 1.5–4.5)
Glucose: 212 mg/dL — ABNORMAL HIGH (ref 70–99)
Potassium: 4.9 mmol/L (ref 3.5–5.2)
Sodium: 140 mmol/L (ref 134–144)
Total Protein: 7 g/dL (ref 6.0–8.5)
eGFR: 54 mL/min/{1.73_m2} — ABNORMAL LOW (ref >59)

## 2023-10-25 LAB — CBC WITH DIFFERENTIAL/PLATELET
Basophils Absolute: 0.1 10*3/uL (ref 0.0–0.2)
Basos: 1 %
EOS (ABSOLUTE): 0.1 10*3/uL (ref 0.0–0.4)
Eos: 2 %
Hematocrit: 36.3 % (ref 34.0–46.6)
Hemoglobin: 11.6 g/dL (ref 11.1–15.9)
Immature Grans (Abs): 0.1 10*3/uL (ref 0.0–0.1)
Immature Granulocytes: 2 %
Lymphocytes Absolute: 2.4 10*3/uL (ref 0.7–3.1)
Lymphs: 38 %
MCH: 27.3 pg (ref 26.6–33.0)
MCHC: 32 g/dL (ref 31.5–35.7)
MCV: 85 fL (ref 79–97)
Monocytes Absolute: 0.4 10*3/uL (ref 0.1–0.9)
Monocytes: 6 %
Neutrophils Absolute: 3.2 10*3/uL (ref 1.4–7.0)
Neutrophils: 51 %
Platelets: 266 10*3/uL (ref 150–450)
RBC: 4.25 x10E6/uL (ref 3.77–5.28)
RDW: 13.9 % (ref 11.7–15.4)
WBC: 6.2 10*3/uL (ref 3.4–10.8)

## 2023-10-25 LAB — LIPID PANEL
Chol/HDL Ratio: 5.7 ratio — ABNORMAL HIGH (ref 0.0–4.4)
Cholesterol, Total: 246 mg/dL — ABNORMAL HIGH (ref 100–199)
HDL: 43 mg/dL (ref >39)
LDL Chol Calc (NIH): 174 mg/dL — ABNORMAL HIGH (ref 0–99)
Triglycerides: 157 mg/dL — ABNORMAL HIGH (ref 0–149)
VLDL Cholesterol Cal: 29 mg/dL (ref 5–40)

## 2023-10-25 LAB — BAYER DCA HB A1C WAIVED: HB A1C (BAYER DCA - WAIVED): 8.5 % — ABNORMAL HIGH (ref 4.8–5.6)

## 2023-10-25 MED ORDER — RIZATRIPTAN BENZOATE 10 MG PO TABS: 10.0000 mg | ORAL_TABLET | ORAL | 2 refills | Status: AC | PRN

## 2023-10-25 MED ORDER — GLIPIZIDE ER 10 MG PO TB24: 10.0000 mg | ORAL_TABLET | Freq: Every day | ORAL | 1 refills | Status: AC

## 2023-10-25 MED ORDER — METFORMIN HCL 500 MG PO TABS: 500.0000 mg | ORAL_TABLET | Freq: Two times a day (BID) | ORAL | 1 refills | Status: AC

## 2023-10-25 MED ORDER — BUPROPION HCL ER (XL) 150 MG PO TB24: 450.0000 mg | ORAL_TABLET | Freq: Every day | ORAL | 1 refills | Status: AC

## 2023-10-25 MED ORDER — LOSARTAN POTASSIUM 25 MG PO TABS: 25.0000 mg | ORAL_TABLET | Freq: Every day | ORAL | 1 refills | Status: DC

## 2023-10-25 MED ORDER — CLOPIDOGREL BISULFATE 75 MG PO TABS: 75.0000 mg | ORAL_TABLET | Freq: Every day | ORAL | 1 refills | Status: AC

## 2023-10-25 MED ORDER — TOPIRAMATE 50 MG PO TABS: 50.0000 mg | ORAL_TABLET | Freq: Two times a day (BID) | ORAL | 1 refills | Status: AC

## 2023-10-25 MED ORDER — ATORVASTATIN CALCIUM 80 MG PO TABS: 80.0000 mg | ORAL_TABLET | Freq: Every day | ORAL | 1 refills | Status: AC

## 2023-10-25 MED ORDER — FENOFIBRATE 160 MG PO TABS: 160.0000 mg | ORAL_TABLET | Freq: Every day | ORAL | 1 refills | Status: AC

## 2023-10-25 NOTE — Progress Notes (Addendum)
 Subjective:    Patient ID: [name], female    DOB: 01-05-1961, 63 y.o.   MRN: [id number]   Chief Complaint: medical management of chronic issues     HPI:  [name] is a 63 y.o. who identifies as a female who was assigned female at birth.   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 Primary hypertension No c/o chest pain, sob or headache. Does not check blood pressure at home. BP Readings from Last 3 Encounters:  10/25/23 135/71  07/08/23 (!) 155/76  04/22/23 138/70      2. Mixed hyperlipidemia Does not watch diet very closely and does no dedicated exercise. Lab Results  Component Value Date   CHOL 323 (H) 04/22/2023   HDL 54 04/22/2023   LDLCALC 210 (H) 04/22/2023   LDLDIRECT 136 (H) 12/28/2022   TRIG 293 (H) 04/22/2023   CHOLHDL 6.0 (H) 04/22/2023     3. Diabetes mellitus treated with oral medication (HCC) Does not check blood sugars at home. She would like a CGM if insurance will cover it. Lab Results  Component Value Date   HGBA1C 7.4 (H) 04/04/2023  '  4. Coronary artery disease due to lipid rich plaque Had CABG in September. Is doing well. Last saw cardiology on 02/27/23. Was referred for cardiac rehab.  5. Diverticulosis No recent flare  up  7. Acquired hypothyroidism No issues that she is aware of Lab Results  Component Value Date   TSH 2.600 07/23/2022     8. Anxiety state Anxiety has increased with her recent health changes. She says she is trying to get better.    10/25/2023   10:40 AM 07/08/2023   12:27 PM 04/19/2023   10:58 AM 04/04/2023    3:18 PM  GAD 7 : Generalized Anxiety Score  Nervous, Anxious, on Edge 1 1 0 0  Control/stop worrying 1 1 1  0  Worry too much - different things 1 1 1  0  Trouble relaxing 1 1 0 0  Restless 0 0 0 0  Easily annoyed or  irritable 0 0 0 0  Afraid - awful might happen 0 0 0 0  Total GAD 7 Score 4 4 2  0  Anxiety Difficulty Somewhat difficult Somewhat difficult Somewhat difficult       9. Recurrent major depressive disorder, in partial remission (HCC) Is on wellbutrin  and is doing well    10/25/2023   10:40 AM 07/08/2023   12:27 PM 04/19/2023   10:57 AM  Depression screen PHQ 2/9  Decreased Interest 1 1 0  Down, Depressed, Hopeless 1 1 0  PHQ - 2 Score 2 2 0  Altered sleeping 1 1 1   Tired, decreased energy 1 1 1   Change in appetite 0 0 0  Feeling bad or failure about yourself  1 0 1  Trouble concentrating 0 0 0  Moving slowly or fidgety/restless 0 0 0  Suicidal thoughts 0 0 0  PHQ-9 Score 5 4 3   Difficult doing work/chores Somewhat difficult Very difficult Somewhat difficult     10. Morbid obesity (HCC) Weight is down 8 lbs  Wt Readings from Last 3 Encounters:  10/25/23 197 lb (89.4 kg)  07/08/23 205 lb (93 kg)  04/22/23 204 lb 9.6 oz (92.8 kg)   BMI Readings from Last 3 Encounters:  10/25/23 33.81 kg/m  07/08/23 35.19 kg/m  04/22/23 35.12 kg/m       New complaints: None today  Allergies  Allergen Reactions   Aspirin  Anaphylaxis   Bee Venom Anaphylaxis   Benadryl [Diphenhydramine Hcl] Anaphylaxis   Morphine And Codeine Shortness Of Breath and Itching   Vicks Formula 44 Cough-Cold Pm [Dm-Apap-Cpm] Shortness Of Breath and Rash    Not anaphylaxis   Outpatient Encounter Medications as of 10/25/2023  Medication Sig   albuterol  (VENTOLIN  HFA) 108 (90 Base) MCG/ACT inhaler Inhale 2 puffs into the lungs every 4 (four) hours as needed for wheezing or shortness of breath. (NEEDS TO BE SEEN)   atorvastatin  (LIPITOR) 80 MG tablet TAKE 1 TABLET BY MOUTH EVERY DAY   buPROPion  (WELLBUTRIN  XL) 150 MG 24 hr tablet TAKE 3 TABLETS BY MOUTH DAILY.   clopidogrel  (PLAVIX ) 75 MG tablet TAKE 1 TABLET BY MOUTH EVERY DAY   Continuous Glucose Receiver (DEXCOM G6 RECEIVER) DEVI 1 Device by Does not  apply route in the morning, at noon, in the evening, and at bedtime.   Continuous Glucose Sensor (DEXCOM G6 SENSOR) MISC 1 each by Does not apply route every 14 (fourteen) days.   Continuous Glucose Transmitter (DEXCOM G6 TRANSMITTER) MISC 1 Dose by Does not apply route in the morning, at noon, in the evening, and at bedtime.   EPINEPHrine  0.3 mg/0.3 mL IJ SOAJ injection Inject 0.3 mg into the muscle as needed for anaphylaxis.   ezetimibe  (ZETIA ) 10 MG tablet Take 1 tablet (10 mg total) by mouth daily.   fenofibrate  160 MG tablet Take 1 tablet (160 mg total) by mouth daily.   glipiZIDE  (GLUCOTROL  XL) 10 MG 24 hr tablet Take 1 tablet (10 mg total) by mouth daily.   losartan  (COZAAR ) 25 MG tablet Take 1 tablet (25 mg total) by mouth daily. **NEEDS TO BE SEEN BEFORE NEXT REFILL**   Lutein 20 MG TABS Take 20 mg by mouth daily.   magnesium  oxide (MAG-OX) 400 (240 Mg) MG tablet Take 1 tablet (400 mg total) by mouth 2 (two) times daily for 30 days, then as directed by provider.   meclizine  (ANTIVERT ) 25 MG tablet Take 1 tablet (25 mg total) by mouth 3 (three) times daily as needed.   metFORMIN  (GLUCOPHAGE ) 500 MG tablet Take 1 tablet (500 mg total) by mouth 2 (two) times daily with a meal.   ondansetron  (ZOFRAN ) 4 MG tablet Take 1 tablet (4 mg total) by mouth every 8 (eight) hours as needed for nausea. Deliver to pt's home   rizatriptan  (MAXALT ) 10 MG tablet Take 1 tablet (10 mg total) by mouth as needed for migraine. May repeat in 2 hours if needed   topiramate  (TOPAMAX ) 50 MG tablet Take 1 tablet (50 mg total) by mouth 2 (two) times daily.   traMADol  (ULTRAM ) 50 MG tablet Take 1 tablet (50 mg total) by mouth every 6 (six) hours as needed for moderate pain.   No facility-administered encounter medications on file as of 10/25/2023.    Past Surgical History:  Procedure Laterality Date   ABDOMINAL HYSTERECTOMY  02/10/2007   + lysis of adhesions   APPENDECTOMY  1990s   ruptured, late 90s   BILATERAL  OOPHORECTOMY      in 2 surgeries prior to 9/08   CARPAL TUNNEL RELEASE Right 08/16/2021   Procedure: RIGHT CARPAL TUNNEL RELEASE;  Surgeon: Marilyn Shropshire, MD;  Location: [facility];  Service: Orthopedics;  Laterality: Right;   COLONOSCOPY  02/09/2010  normal upper endoscopy, single colonic polyp,   CORONARY ARTERY BYPASS GRAFT N/A 01/01/2023   Procedure: CORONARY ARTERY BYPASS GRAFTING (CABG) TIMES THREE USING THE LEFT INTERNAL MAMMARY ARTERY (LIMA) AND ENDOSCOPICALLY HARVESTED RIGHT GREATER SAPHENOUS VEIN;  Surgeon: Hilarie Lovely, MD;  Location: [facility] OR;  Service: Open Heart Surgery;  Laterality: N/A;   HERNIA REPAIR     LEFT HEART CATH AND CORONARY ANGIOGRAPHY N/A 12/28/2022   Procedure: LEFT HEART CATH AND CORONARY ANGIOGRAPHY;  Surgeon: Kyra Phy, MD;  Location: [facility];  Service: Cardiovascular;  Laterality: N/A;   TEE WITHOUT CARDIOVERSION N/A 01/01/2023   Procedure: TRANSESOPHAGEAL ECHOCARDIOGRAM;  Surgeon: Hilarie Lovely, MD;  Location: [facility] OR;  Service: Open Heart Surgery;  Laterality: N/A;   VENTRAL HERNIA REPAIR     x3    No family history on file.    Controlled substance contract: n/a     Review of Systems  Constitutional:  Negative for diaphoresis.  Eyes:  Negative for pain.  Respiratory:  Negative for shortness of breath.   Cardiovascular:  Negative for chest pain, palpitations and leg swelling.  Gastrointestinal:  Negative for abdominal pain.  Endocrine: Negative for polydipsia.  Skin:  Negative for rash.  Neurological:  Negative for dizziness, weakness and headaches.  Hematological:  Does not bruise/bleed easily.  All other systems reviewed and are negative.      Objective:   Physical Exam Vitals and nursing note reviewed.  Constitutional:      General: She is not in acute distress.    Appearance: Normal appearance. She is well-developed.  HENT:     Head: Normocephalic.     Right Ear: Tympanic membrane normal.      Left Ear: Tympanic membrane normal.     Nose: Nose normal.     Mouth/Throat:     Mouth: Mucous membranes are moist.  Eyes:     Pupils: Pupils are equal, round, and reactive to light.  Neck:     Vascular: No carotid bruit or JVD.  Cardiovascular:     Rate and Rhythm: Normal rate and regular rhythm.     Heart sounds: Normal heart sounds.  Pulmonary:     Effort: Pulmonary effort is normal. No respiratory distress.     Breath sounds: Normal breath sounds. No wheezing or rales.  Chest:     Chest wall: No tenderness.  Abdominal:     General: Bowel sounds are normal. There is no distension or abdominal bruit.     Palpations: Abdomen is soft. There is no hepatomegaly, splenomegaly, mass or pulsatile mass.     Tenderness: There is no abdominal tenderness.  Musculoskeletal:        General: Normal range of motion.     Cervical back: Normal range of motion and neck supple.  Lymphadenopathy:     Cervical: No cervical adenopathy.  Skin:    General: Skin is warm and dry.  Neurological:     Mental Status: She is alert and oriented to person, place, and time.     Deep Tendon Reflexes: Reflexes are normal and symmetric.  Psychiatric:        Behavior: Behavior normal.        Thought Content: Thought content normal.        Judgment: Judgment normal.    BP 135/71   Pulse 67   Temp 97.7 F (36.5 C) (Temporal)   Ht 5\' 4"  (1.626 m)   Wt 197 lb (89.4 kg)   SpO2 100%  BMI 33.81 kg/m     Hgba1c 7.4%      Assessment & Plan:   [name] comes in today with chief complaint of medical management of chronic issues    Diagnosis and orders addressed:  1. Primary hypertension Low sodium diet - CBC with Differential/Platelet - CMP14+EGFR - losartan  (COZAAR ) 25 MG tablet; Take 1 tablet (25 mg total) by mouth daily.  Dispense: 30 tablet; Refill: 0  2. Diabetes mellitus treated with oral medication (HCC) Stricter carb counting - Microalbumin / creatinine urine ratio -  Bayer DCA Hb A1c Waived - glipiZIDE  (GLUCOTROL  XL) 10 MG 24 hr tablet; Take 1 tablet (10 mg total) by mouth daily.  Dispense: 180 tablet; Refill: 1 - metFORMIN  (GLUCOPHAGE ) 500 MG tablet; Take 1 tablet (500 mg total) by mouth 2 (two) times daily with a meal.  Dispense: 180 tablet; Refill: 1 - Continuous Glucose Sensor (FREESTYLE LIBRE 3 SENSOR) MISC; 1 each by Does not apply route every 14 (fourteen) days.  Dispense: 2 each; Refill: 3 - Continuous Glucose Receiver (FREESTYLE LIBRE 3 READER) DEVI; 1 each by Does not apply route daily.  Dispense: 1 each; Refill: 0  3. Coronary artery disease due to lipid rich plaque Keep follow up with cardiology Continue cardiac rehab - clopidogrel  (PLAVIX ) 75 MG tablet; Take 1 tablet (75 mg total) by mouth daily.  Dispense: 30 tablet; Refill: 2  4. Diverticulosis Watch diet t o prevent flare up  5. Hyperlipidemia associated with type 2 diabetes mellitus (HCC) Low fat diet - fenofibrate  160 MG tablet; Take 1 tablet (160 mg total) by mouth daily.  Dispense: 90 tablet; Refill: 1 - atorvastatin  (LIPITOR) 80 MG tablet; Take 1 tablet (80 mg total) by mouth daily.  Dispense: 30 tablet; Refill: 2 - LDL Cholesterol, Direct  6. Acquired hypothyroidism Labs oending  7. Anxiety state - buPROPion  (WELLBUTRIN  XL) 150 MG 24 hr tablet; Take 3 tablets (450 mg total) by mouth daily.  Dispense: 270 tablet; Refill: 0  8. Recurrent major depressive disorder, in partial remission (HCC) Stress maanegement  9. Intractable hemiplegic migraine with status migrainosus - topiramate  (TOPAMAX ) 50 MG tablet; Take 1 tablet (50 mg total) by mouth 2 (two) times daily.  Dispense: 180 tablet; Refill: 1  10. Migraine with aura and without status migrainosus, not intractable - rizatriptan  (MAXALT ) 10 MG tablet; Take 1 tablet (10 mg total) by mouth as needed for migraine. May repeat in 2 hours if needed  Dispense: 10 tablet; Refill: 2   Labs pending Health Maintenance reviewed Diet  and exercise encouraged  Follow up plan: 6 months   Mary-Margaret Gaylyn Keas, FNP

## 2023-10-25 NOTE — Patient Instructions (Signed)

## 2023-10-25 NOTE — Addendum Note (Signed)
 Addended by: Delfina Feller on: 10/25/2023 11:10 AM   Modules accepted: Level of Service

## 2023-10-28 ENCOUNTER — Ambulatory Visit: Payer: Self-pay | Admitting: Nurse Practitioner

## 2023-12-03 ENCOUNTER — Ambulatory Visit: Payer: Self-pay | Admitting: Nurse Practitioner

## 2024-01-28 ENCOUNTER — Ambulatory Visit: Payer: Self-pay | Admitting: Nurse Practitioner

## 2024-04-22 ENCOUNTER — Emergency Department (HOSPITAL_COMMUNITY): Payer: Self-pay

## 2024-04-22 ENCOUNTER — Encounter (HOSPITAL_COMMUNITY): Payer: Self-pay

## 2024-04-22 ENCOUNTER — Emergency Department (HOSPITAL_COMMUNITY)
Admission: EM | Admit: 2024-04-22 | Discharge: 2024-04-23 | Disposition: A | Discharge status: Home / Self Care | Payer: Self-pay

## 2024-04-22 ENCOUNTER — Other Ambulatory Visit: Payer: Self-pay

## 2024-04-22 DIAGNOSIS — M25561 Pain in right knee: Secondary | ICD-10-CM | POA: Insufficient documentation

## 2024-04-22 DIAGNOSIS — Z7902 Long term (current) use of antithrombotics/antiplatelets: Secondary | ICD-10-CM | POA: Insufficient documentation

## 2024-04-22 DIAGNOSIS — R55 Syncope and collapse: Secondary | ICD-10-CM | POA: Insufficient documentation

## 2024-04-22 LAB — CBC WITH DIFFERENTIAL/PLATELET
Abs Immature Granulocytes: 0.06 K/uL (ref 0.00–0.07)
Basophils Absolute: 0 K/uL (ref 0.0–0.1)
Basophils Relative: 1 %
Eosinophils Absolute: 0.1 K/uL (ref 0.0–0.5)
Eosinophils Relative: 1 %
HCT: 37.3 % (ref 36.0–46.0)
Hemoglobin: 12.4 g/dL (ref 12.0–15.0)
Immature Granulocytes: 1 %
Lymphocytes Relative: 23 %
Lymphs Abs: 1.6 K/uL (ref 0.7–4.0)
MCH: 26.7 pg (ref 26.0–34.0)
MCHC: 33.2 g/dL (ref 30.0–36.0)
MCV: 80.2 fL (ref 80.0–100.0)
Monocytes Absolute: 0.3 K/uL (ref 0.1–1.0)
Monocytes Relative: 5 %
Neutro Abs: 4.6 K/uL (ref 1.7–7.7)
Neutrophils Relative %: 69 %
Platelets: 219 K/uL (ref 150–400)
RBC: 4.65 MIL/uL (ref 3.87–5.11)
RDW: 13.5 % (ref 11.5–15.5)
WBC: 6.7 K/uL (ref 4.0–10.5)
nRBC: 0 % (ref 0.0–0.2)

## 2024-04-22 LAB — I-STAT CHEM 8, ED
BUN: 19 mg/dL (ref 8–23)
Calcium, Ion: 1.19 mmol/L (ref 1.15–1.40)
Chloride: 101 mmol/L (ref 98–111)
Creatinine, Ser: 0.8 mg/dL (ref 0.44–1.00)
Glucose, Bld: 340 mg/dL — ABNORMAL HIGH (ref 70–99)
HCT: 37 % (ref 36.0–46.0)
Hemoglobin: 12.6 g/dL (ref 12.0–15.0)
Potassium: 4.5 mmol/L (ref 3.5–5.1)
Sodium: 133 mmol/L — ABNORMAL LOW (ref 135–145)
TCO2: 24 mmol/L (ref 22–32)

## 2024-04-22 LAB — BASIC METABOLIC PANEL WITH GFR
Anion gap: 12 (ref 5–15)
BUN: 17 mg/dL (ref 8–23)
CO2: 23 mmol/L (ref 22–32)
Calcium: 9.4 mg/dL (ref 8.9–10.3)
Chloride: 98 mmol/L (ref 98–111)
Creatinine, Ser: 0.79 mg/dL (ref 0.44–1.00)
GFR, Estimated: >60 mL/min (ref >60)
Glucose, Bld: 340 mg/dL — ABNORMAL HIGH (ref 70–99)
Potassium: 4.5 mmol/L (ref 3.5–5.1)
Sodium: 133 mmol/L — ABNORMAL LOW (ref 135–145)

## 2024-04-22 LAB — TROPONIN I (HIGH SENSITIVITY): Troponin I (High Sensitivity): 13 ng/L (ref <18)

## 2024-04-22 MED ORDER — SODIUM CHLORIDE 0.9 % IV BOLUS
500.0000 mL | Freq: Once | INTRAVENOUS | Status: AC
  Administered 2024-04-22: 500 mL via INTRAVENOUS

## 2024-04-22 NOTE — ED Triage Notes (Signed)
 Pt bib [facility] after pt had syncopal event at vet office. Pt reported to EMS chest pain one hour prior to event occurring. Pt did lose consciousness for unknown time. EMS administered 1 nitroglycerin . GCS 15.
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
## 2024-04-22 NOTE — Discharge Instructions (Addendum)
 Drink lots of fluids, take your medications as prescribed and follow-up with your primary care doctor as needed.

## 2024-04-26 ENCOUNTER — Other Ambulatory Visit: Payer: Self-pay | Admitting: *Deleted

## 2024-04-26 DIAGNOSIS — I10 Essential (primary) hypertension: Secondary | ICD-10-CM

## 2024-04-29 ENCOUNTER — Encounter (HOSPITAL_COMMUNITY): Payer: Self-pay

## 2024-04-29 ENCOUNTER — Emergency Department (HOSPITAL_COMMUNITY): Payer: Worker's Compensation

## 2024-04-29 ENCOUNTER — Emergency Department (HOSPITAL_COMMUNITY)
Admission: EM | Admit: 2024-04-29 | Discharge: 2024-04-29 | Disposition: A | Discharge status: Home / Self Care | Payer: Worker's Compensation

## 2024-04-29 DIAGNOSIS — Z7984 Long term (current) use of oral hypoglycemic drugs: Secondary | ICD-10-CM | POA: Insufficient documentation

## 2024-04-29 DIAGNOSIS — Z7902 Long term (current) use of antithrombotics/antiplatelets: Secondary | ICD-10-CM | POA: Diagnosis not present

## 2024-04-29 DIAGNOSIS — Y990 Civilian activity done for income or pay: Secondary | ICD-10-CM | POA: Insufficient documentation

## 2024-04-29 DIAGNOSIS — Z79899 Other long term (current) drug therapy: Secondary | ICD-10-CM | POA: Diagnosis not present

## 2024-04-29 DIAGNOSIS — I10 Essential (primary) hypertension: Secondary | ICD-10-CM | POA: Diagnosis not present

## 2024-04-29 DIAGNOSIS — Z951 Presence of aortocoronary bypass graft: Secondary | ICD-10-CM | POA: Insufficient documentation

## 2024-04-29 DIAGNOSIS — E119 Type 2 diabetes mellitus without complications: Secondary | ICD-10-CM | POA: Diagnosis not present

## 2024-04-29 DIAGNOSIS — S0083XA Contusion of other part of head, initial encounter: Secondary | ICD-10-CM | POA: Diagnosis not present

## 2024-04-29 DIAGNOSIS — M25552 Pain in left hip: Secondary | ICD-10-CM | POA: Insufficient documentation

## 2024-04-29 DIAGNOSIS — W19XXXA Unspecified fall, initial encounter: Secondary | ICD-10-CM

## 2024-04-29 DIAGNOSIS — W010XXA Fall on same level from slipping, tripping and stumbling without subsequent striking against object, initial encounter: Secondary | ICD-10-CM | POA: Diagnosis not present

## 2024-04-29 DIAGNOSIS — S8001XA Contusion of right knee, initial encounter: Secondary | ICD-10-CM | POA: Insufficient documentation

## 2024-04-29 DIAGNOSIS — S0990XA Unspecified injury of head, initial encounter: Secondary | ICD-10-CM

## 2024-04-29 DIAGNOSIS — S8991XA Unspecified injury of right lower leg, initial encounter: Secondary | ICD-10-CM | POA: Diagnosis present

## 2024-04-29 DIAGNOSIS — M25551 Pain in right hip: Secondary | ICD-10-CM | POA: Diagnosis not present

## 2024-04-29 LAB — CBG MONITORING, ED: Glucose-Capillary: 359 mg/dL — ABNORMAL HIGH (ref 70–99)

## 2024-04-29 MED ORDER — CYCLOBENZAPRINE HCL 10 MG PO TABS: 10.0000 mg | ORAL_TABLET | Freq: Three times a day (TID) | ORAL | 0 refills | Status: AC

## 2024-04-29 MED ORDER — HYDROCODONE-ACETAMINOPHEN 5-325 MG PO TABS: 1.0000 | ORAL_TABLET | ORAL | 0 refills | Status: AC | PRN

## 2024-04-29 MED ORDER — HYDROCODONE-ACETAMINOPHEN 5-325 MG PO TABS
1.0000 | ORAL_TABLET | Freq: Once | ORAL | Status: AC
  Administered 2024-04-29: 1 via ORAL
  Filled 2024-04-29: qty 1

## 2024-04-29 MED ORDER — CYCLOBENZAPRINE HCL 10 MG PO TABS
10.0000 mg | ORAL_TABLET | Freq: Once | ORAL | Status: AC
  Administered 2024-04-29: 10 mg via ORAL
  Filled 2024-04-29: qty 1

## 2024-04-29 NOTE — ED Notes (Signed)
 Pt d/c home per EDP order. Discharge summary reviewed, pt verbalizes understanding. Ambulatory off unit, reports discharge ride home.

## 2024-04-29 NOTE — Discharge Instructions (Addendum)
 You were given a short course of muscle relaxers to help with your symptoms, please take these as prescribed.  Please be aware this medication can make you drowsy.  You were also given Norco to help with severe pain, please be aware this medication contains Tylenol .  If you experience any worsening pain, headaches, worsening symptoms please return to emergency department.

## 2024-04-29 NOTE — ED Provider Notes (Signed)
 [place] [facility] AT [facility] Provider Note   CSN: [id number] Arrival date & time: 04/29/24  2022     Patient presents with: Fall (Blood thinner)   [name] is a 63 y.o. female.   63 year old female DM, HTN, Migraines, CABG on plavix  presents to the ED via EMS s/p fall. Patient was working at [employer] when suddenly she saw a dog and tripped over the leash.  She reports she landed on that right knee, she tried to get herself up however she fell backwards striking the back of her head.  She does endorse some loss of consciousness.  She reports she was told not to move and they called the ambulance.  She has not ambulated since the episode occurred.  She is taking Plavix  with a prior history of open heart surgery.  She is complaining of pain along both of her hips and cervical spine.  She was not given any medication for improvement in her symptoms.  She denies any nausea, vomiting, headaches.   The history is provided by the patient.  Fall This is a new problem. Pertinent negatives include no chest pain, no abdominal pain, no headaches and no shortness of breath.       Prior to Admission medications   Medication Sig Start Date End Date Taking? Authorizing Provider  cyclobenzaprine  (FLEXERIL ) 10 MG tablet Take 1 tablet (10 mg total) by mouth 3 (three) times daily for 7 days. 04/29/24 05/06/24 Yes Tysen Roesler, PA-C  HYDROcodone -acetaminophen  (NORCO/VICODIN) 5-325 MG tablet Take 1 tablet by mouth every 4 (four) hours as needed for up to 3 days. 04/29/24 05/02/24 Yes Naya Ilagan, PA-C  albuterol  (VENTOLIN  HFA) 108 (90 Base) MCG/ACT inhaler Inhale 2 puffs into the lungs every 4 (four) hours as needed for wheezing or shortness of breath. (NEEDS TO BE SEEN) 03/30/20   Dettinger, Fonda LABOR, MD  atorvastatin  (LIPITOR) 80 MG tablet Take 1 tablet (80 mg total) by mouth daily. 10/25/23   Gladis Mary-Margaret, FNP  buPROPion  (WELLBUTRIN  XL) 150 MG 24 hr tablet  Take 3 tablets (450 mg total) by mouth daily. 10/25/23   Gladis Mary-Margaret, FNP  clopidogrel  (PLAVIX ) 75 MG tablet Take 1 tablet (75 mg total) by mouth daily. 10/25/23   Gladis Mary-Margaret, FNP  Continuous Glucose Receiver (DEXCOM G6 RECEIVER) DEVI 1 Device by Does not apply route in the morning, at noon, in the evening, and at bedtime. Patient not taking: Reported on 10/25/2023 04/29/23   Jolinda Norene HERO, DO  Continuous Glucose Sensor (DEXCOM G6 SENSOR) MISC 1 each by Does not apply route every 14 (fourteen) days. Patient not taking: Reported on 10/25/2023 04/18/23   Gladis Mustard, FNP  Continuous Glucose Transmitter (DEXCOM G6 TRANSMITTER) MISC 1 Dose by Does not apply route in the morning, at noon, in the evening, and at bedtime. Patient not taking: Reported on 10/25/2023 04/29/23   Jolinda Norene HERO, DO  EPINEPHrine  0.3 mg/0.3 mL IJ SOAJ injection Inject 0.3 mg into the muscle as needed for anaphylaxis. 11/22/20   Gladis Mustard, FNP  fenofibrate  160 MG tablet Take 1 tablet (160 mg total) by mouth daily. 10/25/23   Gladis, Mary-Margaret, FNP  glipiZIDE  (GLUCOTROL  XL) 10 MG 24 hr tablet Take 1 tablet (10 mg total) by mouth daily. 10/25/23   Gladis, Mary-Margaret, FNP  losartan  (COZAAR ) 25 MG tablet TAKE 1 TABLET (25 MG TOTAL) BY MOUTH DAILY. **NEEDS TO BE SEEN BEFORE NEXT REFILL** 04/27/24   Gladis Mary-Margaret, FNP  Lutein 20 MG TABS  Take 20 mg by mouth daily.    [provider]  meclizine  (ANTIVERT ) 25 MG tablet Take 1 tablet (25 mg total) by mouth 3 (three) times daily as needed. 07/08/23   Gladis Mary-Margaret, FNP  metFORMIN  (GLUCOPHAGE ) 500 MG tablet Take 1 tablet (500 mg total) by mouth 2 (two) times daily with a meal. 10/25/23   Gladis, Mary-Margaret, FNP  ondansetron  (ZOFRAN ) 4 MG tablet Take 1 tablet (4 mg total) by mouth every 8 (eight) hours as needed for nausea. Deliver to pt's home 01/21/23   Barrett, Rocky SAUNDERS, PA-C  rizatriptan  (MAXALT ) 10 MG tablet Take 1  tablet (10 mg total) by mouth as needed for migraine. May repeat in 2 hours if needed 10/25/23   Gladis Mustard, FNP  topiramate  (TOPAMAX ) 50 MG tablet Take 1 tablet (50 mg total) by mouth 2 (two) times daily. 10/25/23   Gladis Mary-Margaret, FNP    Allergies: Aspirin , Bee venom, Benadryl [diphenhydramine hcl], Morphine and codeine, and Vicks formula 44 cough-cold pm [dm-apap-cpm]    Review of Systems  Constitutional:  Negative for chills and fever.  Respiratory:  Negative for shortness of breath.   Cardiovascular:  Negative for chest pain.  Gastrointestinal:  Negative for abdominal pain, nausea and vomiting.  Genitourinary:  Negative for flank pain.  Musculoskeletal:  Positive for arthralgias, back pain and myalgias.  Neurological:  Negative for numbness and headaches.  All other systems reviewed and are negative.   Updated Vital Signs BP (!) 106/45   Pulse 66   Temp 97.9 F (36.6 C) (Oral)   Resp 15   Ht 5' 4 (1.626 m)   Wt 88.5 kg   SpO2 98%   BMI 33.47 kg/m   Physical Exam Vitals and nursing note reviewed.  Constitutional:      Appearance: Normal appearance.  HENT:     Head: Normocephalic.     Comments: Mild bruising noted to the left side of her face.    Mouth/Throat:     Mouth: Mucous membranes are moist.  Neck:     Comments: Aspen C collar in place.  Cardiovascular:     Rate and Rhythm: Normal rate.  Pulmonary:     Effort: Pulmonary effort is normal.  Abdominal:     General: Abdomen is flat.     Palpations: Abdomen is soft.  Musculoskeletal:       Legs:  Skin:    General: Skin is warm and dry.  Neurological:     Mental Status: She is alert and oriented to person, place, and time.     Comments: Alert, oriented, thought content appropriate. Speech fluent without evidence of aphasia. Able to follow 2 step commands without difficulty.  Cranial Nerves:  II:  Peripheral visual fields grossly normal, pupils, round, reactive to light III,IV, VI: ptosis  not present, extra-ocular motions intact bilaterally  V,VII: smile symmetric, facial light touch sensation equal VIII: hearing grossly normal bilaterally  IX,X: midline uvula rise  XI: bilateral shoulder shrug equal and strong XII: midline tongue extension  Motor:  5/5 in upper and lower extremities bilaterally including strong and equal grip strength and dorsiflexion/plantar flexion Sensory: light touch normal in all extremities.  Cerebellar: normal finger-to-nose with bilateral upper extremities, pronator drift negative Gait: N/A       (all labs ordered are listed, but only abnormal results are displayed) Labs Reviewed  CBG MONITORING, ED - Abnormal; Notable for the following components:      Result Value   Glucose-Capillary 359 (*)  All other components within normal limits    EKG: None  Radiology: DG Pelvis Portable Result Date: 04/29/2024 CLINICAL DATA:  Status post fall. EXAM: PORTABLE PELVIS 1-2 VIEWS COMPARISON:  None Available. FINDINGS: There is no evidence of pelvic fracture or diastasis. No pelvic bone lesions are seen. Multiple small radiopaque surgical coils are seen overlying the lower abdomen and pelvis. IMPRESSION: Negative. Electronically Signed   By: Suzen Dials M.D.   On: 04/29/2024 21:53   DG Knee Complete 4 Views Left Result Date: 04/29/2024 CLINICAL DATA:  Status post fall. EXAM: LEFT KNEE - COMPLETE 4+ VIEW COMPARISON:  April 25, 2019 FINDINGS: No evidence of fracture, dislocation, or joint effusion. No evidence of arthropathy or other focal bone abnormality. Soft tissues are unremarkable. IMPRESSION: Negative. Electronically Signed   By: Suzen Dials M.D.   On: 04/29/2024 21:52   CT HEAD WO CONTRAST ( ) Result Date: 04/29/2024 EXAM: CT HEAD AND CERVICAL SPINE 04/29/2024 09:08:42 PM TECHNIQUE: CT of the head and cervical spine was performed without the administration of intravenous contrast. Multiplanar reformatted images are provided  for review. Automated exposure control, iterative reconstruction, and/or weight based adjustment of the mA/kV was utilized to reduce the radiation dose to as low as reasonably achievable. COMPARISON: CT Head and spine Apr 22, 2024 CLINICAL HISTORY: Facial trauma, blunt FINDINGS: CT HEAD BRAIN AND VENTRICLES: No acute intracranial hemorrhage. No mass effect or midline shift. No abnormal extra-axial fluid collection. No evidence of acute infarct. No hydrocephalus. ORBITS: No acute abnormality. SINUSES AND MASTOIDS: No acute abnormality. SOFT TISSUES AND SKULL: No acute skull fracture. No acute soft tissue abnormality. CT CERVICAL SPINE BONES AND ALIGNMENT: No acute fracture or traumatic malalignment. DEGENERATIVE CHANGES: No significant degenerative changes. SOFT TISSUES: No prevertebral soft tissue swelling. IMPRESSION: 1. No acute intracranial abnormality. 2. No acute fracture or traumatic malalignment of the cervical spine. Electronically signed by: Gilmore Molt MD 04/29/2024 09:34 PM EST RP Workstation: [id number]   CT Cervical Spine Wo Contrast Result Date: 04/29/2024 EXAM: CT HEAD AND CERVICAL SPINE 04/29/2024 09:08:42 PM TECHNIQUE: CT of the head and cervical spine was performed without the administration of intravenous contrast. Multiplanar reformatted images are provided for review. Automated exposure control, iterative reconstruction, and/or weight based adjustment of the mA/kV was utilized to reduce the radiation dose to as low as reasonably achievable. COMPARISON: CT Head and spine Apr 22, 2024 CLINICAL HISTORY: Facial trauma, blunt FINDINGS: CT HEAD BRAIN AND VENTRICLES: No acute intracranial hemorrhage. No mass effect or midline shift. No abnormal extra-axial fluid collection. No evidence of acute infarct. No hydrocephalus. ORBITS: No acute abnormality. SINUSES AND MASTOIDS: No acute abnormality. SOFT TISSUES AND SKULL: No acute skull fracture. No acute soft tissue abnormality. CT CERVICAL SPINE  BONES AND ALIGNMENT: No acute fracture or traumatic malalignment. DEGENERATIVE CHANGES: No significant degenerative changes. SOFT TISSUES: No prevertebral soft tissue swelling. IMPRESSION: 1. No acute intracranial abnormality. 2. No acute fracture or traumatic malalignment of the cervical spine. Electronically signed by: Gilmore Molt MD 04/29/2024 09:34 PM EST RP Workstation: [id number]     Procedures   Medications Ordered in the ED  HYDROcodone -acetaminophen  (NORCO/VICODIN) 5-325 MG per tablet 1 tablet (1 tablet Oral Given 04/29/24 2304)  cyclobenzaprine  (FLEXERIL ) tablet 10 mg (10 mg Oral Given 04/29/24 2305)                                    Medical Decision Making Amount and/or Complexity  of Data Reviewed Radiology: ordered.  Risk Prescription drug management.   Patient presented to the ED status post mechanical fall that occurred after she tripped over a dog leash while working at [employer].  Reports she fell on her right knee having severe pain to that area.  She reports she tried to stand up and that she fell backwards striking the back of her head.  She does report loss of consciousness, she is currently on Plavix  with a prior history of surgery.  She is endorsing pain along the back of her head, her hips, right knee.  Imaging obtained such as  IMPRESSION:  1. No acute intracranial abnormality.  2. No acute fracture or traumatic malalignment of the cervical spine.   X-ray of her pelvis without any acute fracture.  She does have full range of motion of the lower extremities.  X-ray of her knee does not show any dislocation versus fracture.  She did have her c-collar removed by me, she was sat up, does report feeling stiff therefore given Flexeril  along with Norco to help with pain control.   Portions of this note were generated with Scientist, clinical (histocompatibility and immunogenetics). Dictation errors may occur despite best attempts at proofreading.   Final diagnoses:  Fall, initial encounter   Injury of head, initial encounter    ED Discharge Orders          Ordered    cyclobenzaprine  (FLEXERIL ) 10 MG tablet  3 times daily        04/29/24 2216    HYDROcodone -acetaminophen  (NORCO/VICODIN) 5-325 MG tablet  Every 4 hours PRN        04/29/24 2314               Arlis Everly, PA-C 04/29/24 2315    Kammerer, Megan L, DO 04/30/24 1733

## 2024-04-29 NOTE — ED Triage Notes (Signed)
 Pt to ED via EMS from work, c/o mechanical fall, pt tripped on dog leash landing on right side.  Pt hit head. On blood thinner, Plavix . No LOC.   Pt c/o billat hip pain, and head pain. No obvious injury. C-collar in place.   A&O X 4.  No medications given by EMS.  200/110, hr 82, 98%RA, CBG 406 (HX DM, not taking medicine)

## 2024-04-29 NOTE — ED Notes (Signed)
Pt to CT with Noralyn Pick

## 2024-05-24 ENCOUNTER — Other Ambulatory Visit: Payer: Self-pay | Admitting: Nurse Practitioner

## 2024-05-24 DIAGNOSIS — I10 Essential (primary) hypertension: Secondary | ICD-10-CM

## 2024-05-25 MED ORDER — LOSARTAN POTASSIUM 25 MG PO TABS: 25.0000 mg | ORAL_TABLET | Freq: Every day | ORAL | 0 refills | Status: AC

## 2024-05-25 NOTE — Addendum Note (Signed)
 Addended by: Kahlea Cobert D on: 05/25/2024 02:33 PM   Modules accepted: Orders

## 2024-05-25 NOTE — Telephone Encounter (Signed)
 MMM pt NTBS 30-d given 04/27/24

## 2024-05-25 NOTE — Telephone Encounter (Signed)
 Patient has appt 06-25-2024 with MMM. Patient states she don't have insurance and she needs her medication called in until she can see MMM. States if she can't get it she will have to go somewhere else.

## 2024-06-25 ENCOUNTER — Ambulatory Visit: Payer: Self-pay | Admitting: Nurse Practitioner
